# Patient Record
Sex: Female | Born: 1940
Health system: Southern US, Community
[De-identification: ages and names within clinical notes are randomized; demographics above are authoritative.]

## PROBLEM LIST (undated history)

## (undated) DIAGNOSIS — J309 Allergic rhinitis, unspecified: Secondary | ICD-10-CM

## (undated) DIAGNOSIS — I10 Essential (primary) hypertension: Secondary | ICD-10-CM

## (undated) DIAGNOSIS — M109 Gout, unspecified: Secondary | ICD-10-CM

## (undated) DIAGNOSIS — R42 Dizziness and giddiness: Secondary | ICD-10-CM

## (undated) DIAGNOSIS — K279 Peptic ulcer, site unspecified, unspecified as acute or chronic, without hemorrhage or perforation: Secondary | ICD-10-CM

## (undated) DIAGNOSIS — I219 Acute myocardial infarction, unspecified: Secondary | ICD-10-CM

## (undated) DIAGNOSIS — K760 Fatty (change of) liver, not elsewhere classified: Secondary | ICD-10-CM

## (undated) DIAGNOSIS — F329 Major depressive disorder, single episode, unspecified: Secondary | ICD-10-CM

## (undated) DIAGNOSIS — F419 Anxiety disorder, unspecified: Secondary | ICD-10-CM

## (undated) DIAGNOSIS — T7840XA Allergy, unspecified, initial encounter: Secondary | ICD-10-CM

## (undated) DIAGNOSIS — F32A Depression, unspecified: Secondary | ICD-10-CM

## (undated) DIAGNOSIS — E785 Hyperlipidemia, unspecified: Secondary | ICD-10-CM

## (undated) DIAGNOSIS — J449 Chronic obstructive pulmonary disease, unspecified: Secondary | ICD-10-CM

## (undated) DIAGNOSIS — N189 Chronic kidney disease, unspecified: Secondary | ICD-10-CM

## (undated) DIAGNOSIS — K219 Gastro-esophageal reflux disease without esophagitis: Secondary | ICD-10-CM

## (undated) HISTORY — PX: WISDOM TOOTH EXTRACTION: SHX21

## (undated) HISTORY — DX: Anxiety disorder, unspecified: F41.9

## (undated) HISTORY — DX: Hyperlipidemia, unspecified: E78.5

## (undated) HISTORY — DX: Allergic rhinitis, unspecified: J30.9

## (undated) HISTORY — DX: Chronic kidney disease, unspecified: N18.9

## (undated) HISTORY — DX: Allergy, unspecified, initial encounter: T78.40XA

## (undated) HISTORY — DX: Fatty (change of) liver, not elsewhere classified: K76.0

## (undated) HISTORY — DX: Acute myocardial infarction, unspecified: I21.9

## (undated) HISTORY — PX: APPENDECTOMY: SHX54

## (undated) HISTORY — DX: Peptic ulcer, site unspecified, unspecified as acute or chronic, without hemorrhage or perforation: K27.9

## (undated) HISTORY — PX: ABDOMINAL HYSTERECTOMY: SHX81

## (undated) HISTORY — DX: Gout, unspecified: M10.9

---

## 2003-01-04 DIAGNOSIS — K279 Peptic ulcer, site unspecified, unspecified as acute or chronic, without hemorrhage or perforation: Secondary | ICD-10-CM

## 2003-01-04 HISTORY — DX: Peptic ulcer, site unspecified, unspecified as acute or chronic, without hemorrhage or perforation: K27.9

## 2004-04-30 ENCOUNTER — Ambulatory Visit (HOSPITAL_COMMUNITY): Admission: RE | Admit: 2004-04-30 | Discharge: 2004-04-30 | Payer: Self-pay | Admitting: Internal Medicine

## 2007-02-17 ENCOUNTER — Ambulatory Visit (HOSPITAL_COMMUNITY): Admission: RE | Admit: 2007-02-17 | Discharge: 2007-02-17 | Payer: Self-pay | Admitting: Internal Medicine

## 2009-09-29 ENCOUNTER — Emergency Department (HOSPITAL_COMMUNITY): Admission: EM | Admit: 2009-09-29 | Discharge: 2009-09-29 | Payer: Self-pay | Admitting: Emergency Medicine

## 2010-03-03 ENCOUNTER — Encounter: Admission: RE | Admit: 2010-03-03 | Discharge: 2010-03-03 | Payer: Self-pay | Admitting: Internal Medicine

## 2010-04-25 ENCOUNTER — Encounter: Payer: Self-pay | Admitting: Internal Medicine

## 2010-06-21 LAB — DIFFERENTIAL
Basophils Absolute: 0 10*3/uL (ref 0.0–0.1)
Basophils Relative: 0 % (ref 0–1)
Lymphocytes Relative: 21 % (ref 12–46)
Neutro Abs: 8.6 10*3/uL — ABNORMAL HIGH (ref 1.7–7.7)

## 2010-06-21 LAB — CBC
HCT: 41.4 % (ref 36.0–46.0)
Hemoglobin: 14.1 g/dL (ref 12.0–15.0)
MCH: 30 pg (ref 26.0–34.0)
MCV: 87.9 fL (ref 78.0–100.0)
RBC: 4.71 MIL/uL (ref 3.87–5.11)

## 2010-06-21 LAB — COMPREHENSIVE METABOLIC PANEL
BUN: 14 mg/dL (ref 6–23)
CO2: 24 mEq/L (ref 19–32)
Chloride: 98 mEq/L (ref 96–112)
Creatinine, Ser: 1.19 mg/dL (ref 0.4–1.2)
GFR calc non Af Amer: 45 mL/min — ABNORMAL LOW (ref >60)
Glucose, Bld: 96 mg/dL (ref 70–99)
Total Bilirubin: 0.5 mg/dL (ref 0.3–1.2)

## 2010-06-21 LAB — LIPASE, BLOOD: Lipase: 34 U/L (ref 11–59)

## 2010-06-21 LAB — POCT CARDIAC MARKERS
CKMB, poc: 1.6 ng/mL (ref 1.0–8.0)
Myoglobin, poc: 276 ng/mL (ref 12–200)

## 2011-06-17 ENCOUNTER — Encounter (INDEPENDENT_AMBULATORY_CARE_PROVIDER_SITE_OTHER): Payer: No Typology Code available for payment source | Admitting: Obstetrics and Gynecology

## 2011-06-17 DIAGNOSIS — L820 Inflamed seborrheic keratosis: Secondary | ICD-10-CM

## 2012-03-14 ENCOUNTER — Other Ambulatory Visit: Payer: Self-pay | Admitting: Obstetrics and Gynecology

## 2012-03-14 MED ORDER — NYSTATIN-TRIAMCINOLONE 100000-0.1 UNIT/GM-% EX OINT: TOPICAL_OINTMENT | Freq: Two times a day (BID) | CUTANEOUS | Status: DC

## 2013-01-09 ENCOUNTER — Other Ambulatory Visit (HOSPITAL_COMMUNITY): Payer: Self-pay | Admitting: Internal Medicine

## 2013-01-09 DIAGNOSIS — Z1231 Encounter for screening mammogram for malignant neoplasm of breast: Secondary | ICD-10-CM

## 2013-01-18 ENCOUNTER — Ambulatory Visit (HOSPITAL_COMMUNITY)
Admission: RE | Admit: 2013-01-18 | Discharge: 2013-01-18 | Disposition: A | Discharge status: Home / Self Care | Payer: Medicare Other | Source: Ambulatory Visit | Attending: Internal Medicine | Admitting: Internal Medicine

## 2013-01-18 DIAGNOSIS — Z1231 Encounter for screening mammogram for malignant neoplasm of breast: Secondary | ICD-10-CM | POA: Insufficient documentation

## 2014-01-30 ENCOUNTER — Other Ambulatory Visit: Payer: Self-pay | Admitting: Internal Medicine

## 2014-01-30 ENCOUNTER — Other Ambulatory Visit: Payer: Self-pay

## 2014-01-30 DIAGNOSIS — E2839 Other primary ovarian failure: Secondary | ICD-10-CM

## 2014-01-30 DIAGNOSIS — M810 Age-related osteoporosis without current pathological fracture: Secondary | ICD-10-CM

## 2014-01-30 DIAGNOSIS — Z1231 Encounter for screening mammogram for malignant neoplasm of breast: Secondary | ICD-10-CM

## 2014-02-12 ENCOUNTER — Other Ambulatory Visit (HOSPITAL_BASED_OUTPATIENT_CLINIC_OR_DEPARTMENT_OTHER): Payer: Self-pay | Admitting: Internal Medicine

## 2014-02-12 ENCOUNTER — Ambulatory Visit (HOSPITAL_BASED_OUTPATIENT_CLINIC_OR_DEPARTMENT_OTHER)
Admission: RE | Admit: 2014-02-12 | Discharge: 2014-02-12 | Disposition: A | Discharge status: Home / Self Care | Payer: Medicare PPO | Source: Ambulatory Visit | Attending: Internal Medicine | Admitting: Internal Medicine

## 2014-02-12 DIAGNOSIS — M25512 Pain in left shoulder: Secondary | ICD-10-CM | POA: Insufficient documentation

## 2014-02-12 DIAGNOSIS — R52 Pain, unspecified: Secondary | ICD-10-CM

## 2014-03-05 ENCOUNTER — Other Ambulatory Visit: Payer: Self-pay | Admitting: Internal Medicine

## 2014-03-05 ENCOUNTER — Ambulatory Visit
Admission: RE | Admit: 2014-03-05 | Discharge: 2014-03-05 | Disposition: A | Discharge status: Home / Self Care | Payer: Commercial Managed Care - HMO | Source: Ambulatory Visit | Attending: Internal Medicine | Admitting: Internal Medicine

## 2014-03-05 ENCOUNTER — Ambulatory Visit
Admission: RE | Admit: 2014-03-05 | Discharge: 2014-03-05 | Disposition: A | Discharge status: Home / Self Care | Payer: Commercial Managed Care - HMO | Source: Ambulatory Visit

## 2014-03-05 DIAGNOSIS — E2839 Other primary ovarian failure: Secondary | ICD-10-CM

## 2014-03-05 DIAGNOSIS — Z1231 Encounter for screening mammogram for malignant neoplasm of breast: Secondary | ICD-10-CM

## 2014-03-05 DIAGNOSIS — M810 Age-related osteoporosis without current pathological fracture: Secondary | ICD-10-CM

## 2015-07-04 ENCOUNTER — Other Ambulatory Visit (HOSPITAL_BASED_OUTPATIENT_CLINIC_OR_DEPARTMENT_OTHER): Payer: Self-pay | Admitting: Internal Medicine

## 2015-07-04 DIAGNOSIS — Z1231 Encounter for screening mammogram for malignant neoplasm of breast: Secondary | ICD-10-CM

## 2015-07-08 ENCOUNTER — Ambulatory Visit (HOSPITAL_BASED_OUTPATIENT_CLINIC_OR_DEPARTMENT_OTHER)
Admission: RE | Admit: 2015-07-08 | Discharge: 2015-07-08 | Disposition: A | Discharge status: Home / Self Care | Payer: Medicare HMO | Source: Ambulatory Visit | Attending: Internal Medicine | Admitting: Internal Medicine

## 2015-07-08 DIAGNOSIS — Z1231 Encounter for screening mammogram for malignant neoplasm of breast: Secondary | ICD-10-CM | POA: Diagnosis not present

## 2016-04-13 ENCOUNTER — Other Ambulatory Visit: Payer: Self-pay | Admitting: Gastroenterology

## 2016-04-13 DIAGNOSIS — K2100 Gastro-esophageal reflux disease with esophagitis, without bleeding: Secondary | ICD-10-CM

## 2016-04-13 DIAGNOSIS — R1013 Epigastric pain: Secondary | ICD-10-CM

## 2016-04-20 ENCOUNTER — Ambulatory Visit
Admission: RE | Admit: 2016-04-20 | Discharge: 2016-04-20 | Disposition: A | Discharge status: Home / Self Care | Payer: Commercial Managed Care - HMO | Source: Ambulatory Visit | Attending: Gastroenterology | Admitting: Gastroenterology

## 2016-04-20 DIAGNOSIS — K2100 Gastro-esophageal reflux disease with esophagitis, without bleeding: Secondary | ICD-10-CM

## 2016-04-20 DIAGNOSIS — R1013 Epigastric pain: Secondary | ICD-10-CM

## 2016-11-13 ENCOUNTER — Emergency Department (HOSPITAL_BASED_OUTPATIENT_CLINIC_OR_DEPARTMENT_OTHER)
Admission: EM | Admit: 2016-11-13 | Discharge: 2016-11-13 | Disposition: A | Discharge status: Home / Self Care | Payer: Medicare HMO | Attending: Physician Assistant | Admitting: Physician Assistant

## 2016-11-13 ENCOUNTER — Encounter (HOSPITAL_BASED_OUTPATIENT_CLINIC_OR_DEPARTMENT_OTHER): Payer: Self-pay | Admitting: Emergency Medicine

## 2016-11-13 ENCOUNTER — Emergency Department (HOSPITAL_BASED_OUTPATIENT_CLINIC_OR_DEPARTMENT_OTHER): Payer: Medicare HMO

## 2016-11-13 DIAGNOSIS — K5790 Diverticulosis of intestine, part unspecified, without perforation or abscess without bleeding: Secondary | ICD-10-CM | POA: Insufficient documentation

## 2016-11-13 DIAGNOSIS — I10 Essential (primary) hypertension: Secondary | ICD-10-CM | POA: Insufficient documentation

## 2016-11-13 DIAGNOSIS — J449 Chronic obstructive pulmonary disease, unspecified: Secondary | ICD-10-CM | POA: Insufficient documentation

## 2016-11-13 DIAGNOSIS — K5792 Diverticulitis of intestine, part unspecified, without perforation or abscess without bleeding: Secondary | ICD-10-CM

## 2016-11-13 DIAGNOSIS — Z79899 Other long term (current) drug therapy: Secondary | ICD-10-CM | POA: Insufficient documentation

## 2016-11-13 DIAGNOSIS — R1084 Generalized abdominal pain: Secondary | ICD-10-CM | POA: Diagnosis present

## 2016-11-13 HISTORY — DX: Major depressive disorder, single episode, unspecified: F32.9

## 2016-11-13 HISTORY — DX: Gastro-esophageal reflux disease without esophagitis: K21.9

## 2016-11-13 HISTORY — DX: Essential (primary) hypertension: I10

## 2016-11-13 HISTORY — DX: Chronic obstructive pulmonary disease, unspecified: J44.9

## 2016-11-13 HISTORY — DX: Depression, unspecified: F32.A

## 2016-11-13 LAB — CBC WITH DIFFERENTIAL/PLATELET
BASOS ABS: 0 10*3/uL (ref 0.0–0.1)
BASOS PCT: 0 %
EOS ABS: 0.2 10*3/uL (ref 0.0–0.7)
Eosinophils Relative: 2 %
HEMATOCRIT: 38 % (ref 36.0–46.0)
HEMOGLOBIN: 12.1 g/dL (ref 12.0–15.0)
Lymphocytes Relative: 15 %
Lymphs Abs: 1.5 10*3/uL (ref 0.7–4.0)
MCH: 28.2 pg (ref 26.0–34.0)
MCHC: 31.8 g/dL (ref 30.0–36.0)
MCV: 88.6 fL (ref 78.0–100.0)
Monocytes Absolute: 0.6 10*3/uL (ref 0.1–1.0)
Monocytes Relative: 5 %
NEUTROS ABS: 7.8 10*3/uL — AB (ref 1.7–7.7)
Neutrophils Relative %: 78 %
Platelets: 206 10*3/uL (ref 150–400)
RBC: 4.29 MIL/uL (ref 3.87–5.11)
RDW: 16.8 % — ABNORMAL HIGH (ref 11.5–15.5)
WBC: 10.2 10*3/uL (ref 4.0–10.5)

## 2016-11-13 LAB — TROPONIN I

## 2016-11-13 LAB — URINALYSIS, ROUTINE W REFLEX MICROSCOPIC
BILIRUBIN URINE: NEGATIVE
Glucose, UA: NEGATIVE mg/dL
Hgb urine dipstick: NEGATIVE
KETONES UR: NEGATIVE mg/dL
NITRITE: NEGATIVE
PH: 5.5 (ref 5.0–8.0)
Protein, ur: NEGATIVE mg/dL
SPECIFIC GRAVITY, URINE: 1.029 (ref 1.005–1.030)

## 2016-11-13 LAB — COMPREHENSIVE METABOLIC PANEL
ALK PHOS: 76 U/L (ref 38–126)
ALT: 16 U/L (ref 14–54)
ANION GAP: 10 (ref 5–15)
AST: 19 U/L (ref 15–41)
Albumin: 3.8 g/dL (ref 3.5–5.0)
BILIRUBIN TOTAL: 0.5 mg/dL (ref 0.3–1.2)
BUN: 13 mg/dL (ref 6–20)
CALCIUM: 8.9 mg/dL (ref 8.9–10.3)
CO2: 24 mmol/L (ref 22–32)
CREATININE: 1.45 mg/dL — AB (ref 0.44–1.00)
Chloride: 103 mmol/L (ref 101–111)
GFR, EST AFRICAN AMERICAN: 39 mL/min — AB (ref >60)
GFR, EST NON AFRICAN AMERICAN: 34 mL/min — AB (ref >60)
Glucose, Bld: 94 mg/dL (ref 65–99)
Potassium: 4.4 mmol/L (ref 3.5–5.1)
SODIUM: 137 mmol/L (ref 135–145)
TOTAL PROTEIN: 7.8 g/dL (ref 6.5–8.1)

## 2016-11-13 LAB — URINALYSIS, MICROSCOPIC (REFLEX): RBC / HPF: NONE SEEN RBC/hpf (ref 0–5)

## 2016-11-13 MED ORDER — FENTANYL CITRATE (PF) 100 MCG/2ML IJ SOLN: 25.0000 ug | Freq: Once | INTRAMUSCULAR | Status: DC

## 2016-11-13 MED ORDER — CIPROFLOXACIN HCL 500 MG PO TABS
500.0000 mg | ORAL_TABLET | Freq: Once | ORAL | Status: AC
  Administered 2016-11-13: 500 mg via ORAL
  Filled 2016-11-13: qty 1

## 2016-11-13 MED ORDER — IOPAMIDOL (ISOVUE-300) INJECTION 61%
100.0000 mL | Freq: Once | INTRAVENOUS | Status: AC | PRN
  Administered 2016-11-13: 80 mL via INTRAVENOUS

## 2016-11-13 MED ORDER — CIPROFLOXACIN HCL 500 MG PO TABS: 500.0000 mg | ORAL_TABLET | Freq: Two times a day (BID) | ORAL | 0 refills | Status: DC

## 2016-11-13 MED ORDER — SODIUM CHLORIDE 0.9 % IV BOLUS (SEPSIS)
500.0000 mL | Freq: Once | INTRAVENOUS | Status: AC
  Administered 2016-11-13: 500 mL via INTRAVENOUS

## 2016-11-13 MED ORDER — METRONIDAZOLE 500 MG PO TABS
500.0000 mg | ORAL_TABLET | Freq: Once | ORAL | Status: AC
  Administered 2016-11-13: 500 mg via ORAL
  Filled 2016-11-13: qty 1

## 2016-11-13 MED ORDER — METRONIDAZOLE 500 MG PO TABS: 500.0000 mg | ORAL_TABLET | Freq: Two times a day (BID) | ORAL | 0 refills | Status: DC

## 2016-11-13 NOTE — Discharge Instructions (Signed)
Please follow-up with your GI physician as planned. Please return with any high fevers, worsening abdominal pain or other concerns.

## 2016-11-13 NOTE — ED Provider Notes (Addendum)
[facility] Provider Note   CSN: [id number] Arrival date & time: 11/13/16  1348     History   Chief Complaint Chief Complaint  Patient presents with  . Abdominal Pain    HPI Veronica Wolf is a 76 y.o. female.   Patient is a 76 year old female with history of COPD, depression, GERD, hypertension. She is a very inconsistent historian. After discussing her story with her for quite a long time I think that the progression of events was that patient was in [place], had x-ray and was diagnosed with diverticulitis and started her on antibiotics. Then she followed up with Dr. Madilyn Fireman [facility]) in his office. Dr. Madilyn Fireman ordered a CAT scan. She's unclear the results. That was 3 weeks ago. Patient been feeling fine and then acutely started feeling worse today. Patient started having sharp abdominal pain associated with diarrhea and nausea HPI  Past Medical History:  Diagnosis Date  . COPD (chronic obstructive pulmonary disease) (HCC)   . Depression   . GERD (gastroesophageal reflux disease)   . Hypertension     There are no active problems to display for this patient.   Past Surgical History:  Procedure Laterality Date  . ABDOMINAL HYSTERECTOMY      OB History    No data available       Home Medications    Prior to Admission medications   Medication Sig Start Date End Date Taking? Authorizing Provider  allopurinol (ZYLOPRIM) 100 MG tablet Take 100 mg by mouth daily.   Yes [provider]  citalopram (CELEXA) 40 MG tablet Take 40 mg by mouth daily.   Yes [provider]  Colchicine (MITIGARE) 0.6 MG CAPS Take by mouth.   Yes [provider]  cyclobenzaprine (FLEXERIL) 10 MG tablet Take 10 mg by mouth 3 (three) times daily as needed for muscle spasms.   Yes [provider]  diazepam (VALIUM) 10 MG tablet Take 10 mg by mouth every 6 (six) hours as needed for anxiety.   Yes [provider]  dicyclomine (BENTYL) 10 MG  capsule Take 10 mg by mouth 4 (four) times daily -  before meals and at bedtime.   Yes [provider]  Diphenhydramine-APAP 12.5-325 MG/15ML LIQD Take by mouth.   Yes [provider]  lisinopril-hydrochlorothiazide (PRINZIDE,ZESTORETIC) 20-25 MG tablet Take 1 tablet by mouth daily.   Yes [provider]  pantoprazole (PROTONIX) 40 MG tablet Take 40 mg by mouth daily.   Yes [provider]  ciprofloxacin (CIPRO) 500 MG tablet Take 1 tablet (500 mg total) by mouth 2 (two) times daily. 11/13/16   Mackuen, Courteney Lyn, MD  metroNIDAZOLE (FLAGYL) 500 MG tablet Take 1 tablet (500 mg total) by mouth 2 (two) times daily. 11/13/16   Mackuen, Courteney Lyn, MD  nystatin-triamcinolone ointment (MYCOLOG) Apply topically 2 (two) times daily. 03/14/12   Henreitta Leber, PA-C    Family History History reviewed. No pertinent family history.  Social History Social History  Substance Use Topics  . Smoking status: Never Smoker  . Smokeless tobacco: Never Used  . Alcohol use No     Allergies   Patient has no known allergies.   Review of Systems Review of Systems  Constitutional: Negative for activity change.  Respiratory: Negative for shortness of breath.   Cardiovascular: Negative for chest pain.  Gastrointestinal: Positive for abdominal pain, diarrhea and nausea. Negative for vomiting.  Neurological: Negative for dizziness.  All other systems reviewed and are negative.  Physical Exam Updated Vital Signs BP 128/79   Pulse 65   Temp 98.5 F (36.9 C) (Oral)   Resp 18   Ht 5\' 4"  (1.626 m)   Wt 89.8 kg (198 lb)   SpO2 99%   BMI 33.99 kg/m   Physical Exam  Constitutional: She is oriented to person, place, and time. She appears well-developed and well-nourished.  HENT:  Head: Normocephalic and atraumatic.  Eyes: Right eye exhibits no discharge.  Cardiovascular: Normal rate, regular rhythm and normal heart sounds.   No murmur heard. Pulmonary/Chest:  Effort normal and breath sounds normal. She has no wheezes. She has no rales.  Abdominal: Soft. She exhibits distension. There is tenderness.  Neurological: She is oriented to person, place, and time.  Skin: Skin is warm and dry. She is not diaphoretic.  Psychiatric: She has a normal mood and affect.  Nursing note and vitals reviewed.    ED Treatments / Results  Labs (all labs ordered are listed, but only abnormal results are displayed) Labs Reviewed  COMPREHENSIVE METABOLIC PANEL - Abnormal; Notable for the following:       Result Value   Creatinine, Ser 1.45 (*)    GFR calc non Af Amer 34 (*)    GFR calc Af Amer 39 (*)    All other components within normal limits  CBC WITH DIFFERENTIAL/PLATELET - Abnormal; Notable for the following:    RDW 16.8 (*)    Neutro Abs 7.8 (*)    All other components within normal limits  URINALYSIS, ROUTINE W REFLEX MICROSCOPIC - Abnormal; Notable for the following:    Leukocytes, UA TRACE (*)    All other components within normal limits  URINALYSIS, MICROSCOPIC (REFLEX) - Abnormal; Notable for the following:    Bacteria, UA MANY (*)    Squamous Epithelial / LPF 6-30 (*)    All other components within normal limits  TROPONIN I    EKG  EKG Interpretation None       Radiology Ct Abdomen Pelvis W Contrast  Result Date: 11/13/2016 CLINICAL DATA:  Abdominal pain, nausea, diarrhea, and constipation. Symptoms for 1 month. EXAM: CT ABDOMEN AND PELVIS WITH CONTRAST TECHNIQUE: Multidetector CT imaging of the abdomen and pelvis was performed using the standard protocol following bolus administration of intravenous contrast. CONTRAST:  80mL ISOVUE-300 IOPAMIDOL (ISOVUE-300) INJECTION 61% COMPARISON:  [facility] FINDINGS: Lower chest: The lung bases are clear without focal nodule, mass, or airspace disease. The heart size is normal. No significant pleural or pericardial effusion is present. Hepatobiliary: No  focal liver abnormality is seen. No gallstones, gallbladder wall thickening, or biliary dilatation. Pancreas: Unremarkable. No pancreatic ductal dilatation or surrounding inflammatory changes. Spleen: Normal in size without focal abnormality. Adrenals/Urinary Tract: Adrenal glands are normal bilaterally. The kidneys and ureters are unremarkable. No focal mass lesion or stone is present. There is no hydronephrosis. The urinary bladder is within normal limits. Stomach/Bowel: The stomach and duodenum are within normal limits. Small bowel is unremarkable. The appendix is not discretely visualized and may be surgically absent. The ascending and transverse colon are within normal limits. The descending colon is unremarkable. Diverticular changes are present in the sigmoid colon. Subtle inflammatory change suggests early diverticulitis. There is no focal abscess or free air. Vascular/Lymphatic: Atherosclerotic changes are present in the aorta without aneurysm. No significant adenopathy is present. Reproductive: Status post hysterectomy. No adnexal masses. Other: No abdominal wall hernia or abnormality. No abdominopelvic ascites. Musculoskeletal: L1 superior  endplate fracture demonstrates 20% loss of height anteriorly. There is no retropulsion of bone. Fracture may not be completely healed. This was reported previously. Vertebral body heights are otherwise normal. Grade 1 anterolisthesis is associated with uncovering of a broad-based disc protrusion at L3-4. A broad-based disc protrusion and facet hypertrophy is present at L4-5 as well with right greater than left foraminal and subarticular narrowing. The bony pelvis is intact. The hips are unremarkable. IMPRESSION: 1. Sigmoid diverticulitis without evidence for abscess or free air. 2.  Aortic Atherosclerosis (ICD10-I70.0). 3. Age indeterminate superior endplate fracture at L1 with 20% loss of height. 4. Thoracic and lumbar spondylosis as described. 5. Hysterectomy  Electronically Signed   By: Marin Roberts M.D.   On: 11/13/2016 16:24    Procedures Procedures (including critical care time)  Medications Ordered in ED Medications  sodium chloride 0.9 % bolus 500 mL (0 mLs Intravenous Stopped 11/13/16 1649)  iopamidol (ISOVUE-300) 61 % injection 100 mL (80 mLs Intravenous Contrast Given 11/13/16 1557)  ciprofloxacin (CIPRO) tablet 500 mg (500 mg Oral Given 11/13/16 1746)  metroNIDAZOLE (FLAGYL) tablet 500 mg (500 mg Oral Given 11/13/16 1746)     Initial Impression / Assessment and Plan / ED Course  I have reviewed the triage vital signs and the nursing notes.  Pertinent labs & imaging results that were available during my care of the patient were reviewed by me and considered in my medical decision making (see chart for details).    Patient is 76 year old female presenting with unclear course of events. However I know that she has been diagnosed diverticulitis in the past, and has received antibiotics. I knw that she is acutely having abdominal pain today. The pain seems consistent with diverticulitis however given her given her age, an unclear history, will do a repeat CT today.   Patient's daughter arrived. CT showed diverticulitis. We'll treat with antibiotics and have patient follow up with her primary care physician. As well as her gastroenterologist. Daughter in agreement. Patient taking by mouth with normal vital signs at time of discharge.    Final Clinical Impressions(s) / ED Diagnoses   Final diagnoses:  Diverticulitis    New Prescriptions Discharge Medication List as of 11/13/2016  6:15 PM    START taking these medications   Details  ciprofloxacin (CIPRO) 500 MG tablet Take 1 tablet (500 mg total) by mouth 2 (two) times daily., Starting Sat 11/13/2016, Print    metroNIDAZOLE (FLAGYL) 500 MG tablet Take 1 tablet (500 mg total) by mouth 2 (two) times daily., Starting Sat 11/13/2016, Print         Mackuen, Cindee Salt,  MD 11/13/16 2354    Abelino Derrick, MD 11/13/16 1610

## 2016-11-13 NOTE — ED Notes (Signed)
Pt on cardiac monitor and auto VS. Unable to void at this time.

## 2016-11-13 NOTE — ED Triage Notes (Signed)
patient reports that she has had intermittent pain to her abdominal area x 1 month. Patient was [place]. The patient states that the pain was a lot less almost away and she woke up this am and it was back.

## 2016-11-13 NOTE — ED Notes (Signed)
Pt in CT. Aware of need for urine sample.

## 2016-12-22 ENCOUNTER — Inpatient Hospital Stay (HOSPITAL_BASED_OUTPATIENT_CLINIC_OR_DEPARTMENT_OTHER)
Admission: EM | Admit: 2016-12-22 | Discharge: 2016-12-24 | DRG: 313 | Disposition: A | Discharge status: Home / Self Care | Payer: Medicare Other | Attending: Internal Medicine | Admitting: Internal Medicine

## 2016-12-22 ENCOUNTER — Emergency Department (HOSPITAL_BASED_OUTPATIENT_CLINIC_OR_DEPARTMENT_OTHER): Payer: Medicare Other

## 2016-12-22 ENCOUNTER — Observation Stay (HOSPITAL_COMMUNITY): Payer: Medicare Other

## 2016-12-22 ENCOUNTER — Encounter (HOSPITAL_BASED_OUTPATIENT_CLINIC_OR_DEPARTMENT_OTHER): Payer: Self-pay | Admitting: Emergency Medicine

## 2016-12-22 DIAGNOSIS — Z23 Encounter for immunization: Secondary | ICD-10-CM

## 2016-12-22 DIAGNOSIS — N179 Acute kidney failure, unspecified: Secondary | ICD-10-CM | POA: Diagnosis not present

## 2016-12-22 DIAGNOSIS — J449 Chronic obstructive pulmonary disease, unspecified: Secondary | ICD-10-CM | POA: Diagnosis not present

## 2016-12-22 DIAGNOSIS — J4489 Other specified chronic obstructive pulmonary disease: Secondary | ICD-10-CM

## 2016-12-22 DIAGNOSIS — R1013 Epigastric pain: Secondary | ICD-10-CM

## 2016-12-22 DIAGNOSIS — I10 Essential (primary) hypertension: Secondary | ICD-10-CM | POA: Diagnosis not present

## 2016-12-22 DIAGNOSIS — Z79899 Other long term (current) drug therapy: Secondary | ICD-10-CM

## 2016-12-22 DIAGNOSIS — R079 Chest pain, unspecified: Secondary | ICD-10-CM | POA: Diagnosis not present

## 2016-12-22 DIAGNOSIS — R072 Precordial pain: Principal | ICD-10-CM

## 2016-12-22 DIAGNOSIS — K219 Gastro-esophageal reflux disease without esophagitis: Secondary | ICD-10-CM

## 2016-12-22 DIAGNOSIS — F419 Anxiety disorder, unspecified: Secondary | ICD-10-CM | POA: Diagnosis present

## 2016-12-22 DIAGNOSIS — K5792 Diverticulitis of intestine, part unspecified, without perforation or abscess without bleeding: Secondary | ICD-10-CM | POA: Diagnosis present

## 2016-12-22 DIAGNOSIS — F329 Major depressive disorder, single episode, unspecified: Secondary | ICD-10-CM | POA: Diagnosis present

## 2016-12-22 DIAGNOSIS — R0609 Other forms of dyspnea: Secondary | ICD-10-CM

## 2016-12-22 LAB — URINALYSIS, ROUTINE W REFLEX MICROSCOPIC
Bilirubin Urine: NEGATIVE
Glucose, UA: NEGATIVE mg/dL
Hgb urine dipstick: NEGATIVE
KETONES UR: NEGATIVE mg/dL
Nitrite: NEGATIVE
PROTEIN: NEGATIVE mg/dL
Specific Gravity, Urine: 1.005 (ref 1.005–1.030)
pH: 5 (ref 5.0–8.0)

## 2016-12-22 LAB — CBC
HEMATOCRIT: 33.9 % — AB (ref 36.0–46.0)
HEMOGLOBIN: 11.2 g/dL — AB (ref 12.0–15.0)
MCH: 28.9 pg (ref 26.0–34.0)
MCHC: 33 g/dL (ref 30.0–36.0)
MCV: 87.4 fL (ref 78.0–100.0)
Platelets: 216 10*3/uL (ref 150–400)
RBC: 3.88 MIL/uL (ref 3.87–5.11)
RDW: 15.9 % — ABNORMAL HIGH (ref 11.5–15.5)
WBC: 12.6 10*3/uL — ABNORMAL HIGH (ref 4.0–10.5)

## 2016-12-22 LAB — COMPREHENSIVE METABOLIC PANEL
ALK PHOS: 55 U/L (ref 38–126)
ALT: 12 U/L — AB (ref 14–54)
AST: 21 U/L (ref 15–41)
Albumin: 3.6 g/dL (ref 3.5–5.0)
Anion gap: 10 (ref 5–15)
BUN: 23 mg/dL — AB (ref 6–20)
CALCIUM: 9.2 mg/dL (ref 8.9–10.3)
CO2: 16 mmol/L — ABNORMAL LOW (ref 22–32)
CREATININE: 2.79 mg/dL — AB (ref 0.44–1.00)
Chloride: 109 mmol/L (ref 101–111)
GFR, EST AFRICAN AMERICAN: 18 mL/min — AB (ref >60)
GFR, EST NON AFRICAN AMERICAN: 15 mL/min — AB (ref >60)
Glucose, Bld: 116 mg/dL — ABNORMAL HIGH (ref 65–99)
Potassium: 3.9 mmol/L (ref 3.5–5.1)
Sodium: 135 mmol/L (ref 135–145)
Total Bilirubin: 0.4 mg/dL (ref 0.3–1.2)
Total Protein: 7.7 g/dL (ref 6.5–8.1)

## 2016-12-22 LAB — LIPASE, BLOOD: LIPASE: 35 U/L (ref 11–51)

## 2016-12-22 LAB — TROPONIN I: Troponin I: <0.03 ng/mL (ref <0.03)

## 2016-12-22 MED ORDER — HYDRALAZINE HCL 20 MG/ML IJ SOLN: 5.0000 mg | Freq: Four times a day (QID) | INTRAMUSCULAR | Status: DC | PRN

## 2016-12-22 MED ORDER — ONDANSETRON HCL 4 MG/2ML IJ SOLN
4.0000 mg | Freq: Once | INTRAMUSCULAR | Status: AC
  Administered 2016-12-22: 4 mg via INTRAVENOUS
  Filled 2016-12-22: qty 2

## 2016-12-22 MED ORDER — IOPAMIDOL (ISOVUE-300) INJECTION 61%
INTRAVENOUS | Status: AC
  Filled 2016-12-22: qty 30

## 2016-12-22 MED ORDER — ALBUTEROL SULFATE (2.5 MG/3ML) 0.083% IN NEBU: 2.5000 mg | INHALATION_SOLUTION | Freq: Four times a day (QID) | RESPIRATORY_TRACT | Status: DC | PRN

## 2016-12-22 MED ORDER — SODIUM CHLORIDE 0.9 % IV BOLUS (SEPSIS)
500.0000 mL | Freq: Once | INTRAVENOUS | Status: AC
  Administered 2016-12-22: 500 mL via INTRAVENOUS

## 2016-12-22 MED ORDER — HEPARIN SODIUM (PORCINE) 5000 UNIT/ML IJ SOLN
5000.0000 [IU] | Freq: Three times a day (TID) | INTRAMUSCULAR | Status: DC
  Administered 2016-12-22 – 2016-12-24 (×5): 5000 [IU] via SUBCUTANEOUS
  Filled 2016-12-22 (×5): qty 1

## 2016-12-22 MED ORDER — PNEUMOCOCCAL VAC POLYVALENT 25 MCG/0.5ML IJ INJ
0.5000 mL | INJECTION | INTRAMUSCULAR | Status: AC
  Administered 2016-12-23: 0.5 mL via INTRAMUSCULAR
  Filled 2016-12-22: qty 0.5

## 2016-12-22 MED ORDER — GI COCKTAIL ~~LOC~~: 30.0000 mL | Freq: Two times a day (BID) | ORAL | Status: DC | PRN

## 2016-12-22 MED ORDER — SODIUM CHLORIDE 0.9 % IV SOLN
INTRAVENOUS | Status: DC
  Administered 2016-12-22 – 2016-12-24 (×3): via INTRAVENOUS

## 2016-12-22 MED ORDER — METOCLOPRAMIDE HCL 5 MG/ML IJ SOLN
10.0000 mg | Freq: Once | INTRAMUSCULAR | Status: AC
  Administered 2016-12-22: 10 mg via INTRAVENOUS
  Filled 2016-12-22: qty 2

## 2016-12-22 MED ORDER — GI COCKTAIL ~~LOC~~
30.0000 mL | Freq: Once | ORAL | Status: AC
Start: 2016-12-22 — End: 2016-12-22
  Administered 2016-12-22: 30 mL via ORAL
  Filled 2016-12-22: qty 30

## 2016-12-22 MED ORDER — PROMETHAZINE HCL 25 MG/ML IJ SOLN
12.5000 mg | Freq: Once | INTRAMUSCULAR | Status: AC
  Administered 2016-12-22: 12.5 mg via INTRAVENOUS
  Filled 2016-12-22: qty 1

## 2016-12-22 MED ORDER — ALBUTEROL SULFATE HFA 108 (90 BASE) MCG/ACT IN AERS: 1.0000 | INHALATION_SPRAY | Freq: Four times a day (QID) | RESPIRATORY_TRACT | Status: DC | PRN

## 2016-12-22 MED ORDER — IOPAMIDOL (ISOVUE-300) INJECTION 61%
30.0000 mL | Freq: Once | INTRAVENOUS | Status: AC | PRN
  Administered 2016-12-22: 30 mL via ORAL

## 2016-12-22 MED ORDER — DICYCLOMINE HCL 10 MG PO CAPS
10.0000 mg | ORAL_CAPSULE | Freq: Once | ORAL | Status: AC
  Administered 2016-12-22: 10 mg via ORAL
  Filled 2016-12-22: qty 1

## 2016-12-22 NOTE — Plan of Care (Signed)
Problem: Safety: Goal: Ability to remain free from injury will improve Outcome: Completed/Met Date Met: 12/22/16 Bed alarm in place. Call bell within reach. Pt/family oriented to room. Pt is aware to call

## 2016-12-22 NOTE — Progress Notes (Addendum)
76 y.o. female with PMH of COPD, GERD, Depression, and HTN presents to the emergency department for evaluation of 1 week of epigastric abdominal discomfort, poor appetite, chronic diffuse abdominal pain, and new onset chest pressure  , labs show her BUN and creatine are elevated 1.45>2.79 EDP wanted to admit for AKI ,dehydration ,chest pain rule out  Accepted to tele at [facility]

## 2016-12-22 NOTE — ED Triage Notes (Signed)
Patient reports constant chest pain which began "weeks ago" but states this is "getting worse".  Patient also reports generalized abdominal pain x 1 month.  Reports shortness of breath, nausea, vomiting.  Denies diarrhea.

## 2016-12-22 NOTE — ED Provider Notes (Signed)
Emergency Department Provider Note   I have reviewed the triage vital signs and the nursing notes.   HISTORY  Chief Complaint Chest Pain   HPI Veronica Wolf is a 76 y.o. female with PMH of COPD, GERD, Depression, and HTN presents to the emergency department for evaluation of 1 week of epigastric abdominal discomfort, poor appetite, chronic diffuse abdominal pain, and new onset chest pressure starting this morning. Patient states she's had ongoing epigastric discomfort similar to prior episodes. No modifying factors. No radiation of symptoms. She was last seen in the emergency department in August and treated for diverticulitis. She states the symptoms have resolved. She denies any associated fevers or chills. She is followed by gastroenterologist but states her last upper endoscopy was a long time ago. She has been taking her home medications including Protonix with no relief in symptoms.   This morning she developed a central chest pressure with no radiation. She denies any shortness of breath. With this being a new feature of her symptoms she became concerned and presented to the emergency department.   Past Medical History:  Diagnosis Date  . COPD (chronic obstructive pulmonary disease) (HCC)   . Depression   . GERD (gastroesophageal reflux disease)   . Hypertension     Patient Active Problem List   Diagnosis Date Noted  . Chest pain 12/22/2016  . AKI (acute kidney injury) (HCC)   . Precordial chest pain     Past Surgical History:  Procedure Laterality Date  . ABDOMINAL HYSTERECTOMY      Current Outpatient Rx  . Order #: [id number] Class: Historical Med  . Order #: [id number] Class: Print  . Order #: [id number] Class: Historical Med  . Order #: [id number] Class: Historical Med  . Order #: [id number] Class: Historical Med  . Order #: [id number] Class: Historical Med  . Order #: [id number] Class: Historical Med  . Order #: [id number] Class: Historical Med  . Order #: [id number] Class: Historical  Med  . Order #: [id number] Class: Print  . Order #: [id number] Class: Normal  . Order #: [id number] Class: Historical Med    Allergies Patient has no known allergies.  History reviewed. No pertinent family history.  Social History Social History  Substance Use Topics  . Smoking status: Never Smoker  . Smokeless tobacco: Never Used  . Alcohol use No    Review of Systems  Constitutional: No fever/chills Eyes: No visual changes. ENT: No sore throat. Cardiovascular: Positive chest pain. Respiratory: Denies shortness of breath. Gastrointestinal: Positive epigastric abdominal pain.  No nausea, no vomiting.  No diarrhea.  No constipation. Genitourinary: Negative for dysuria. Musculoskeletal: Negative for back pain. Skin: Negative for rash. Neurological: Negative for headaches, focal weakness or numbness.  10-point ROS otherwise negative.  ____________________________________________   PHYSICAL EXAM:  VITAL SIGNS: ED Triage Vitals  Enc Vitals Group     BP 12/22/16 0957 (!) 114/53     Pulse Rate 12/22/16 0957 89     Resp 12/22/16 0957 18     Temp 12/22/16 0957 98.5 F (36.9 C)     Temp Source 12/22/16 0957 Oral     SpO2 12/22/16 0957 96 %     Weight 12/22/16 0959 198 lb (89.8 kg)     Height 12/22/16 0959  (1.6 m)     Pain Score 12/22/16 0958 8   Constitutional: Alert and oriented. Well appearing and in no acute distress. Eyes: Conjunctivae are normal.  Head: Atraumatic. Nose: No congestion/rhinnorhea. Mouth/Throat: Mucous membranes are moist.  Neck: No stridor.  Cardiovascular: Normal  rate, regular rhythm. Good peripheral circulation. Grossly normal heart sounds.   Respiratory: Normal respiratory effort.  No retractions. Lungs CTAB. Gastrointestinal: Soft with mild epigastric abdominal pain. No rebound or guarding. No distention.  Musculoskeletal: No lower extremity tenderness nor edema. No gross deformities of extremities. Neurologic:  Normal speech and language. No  gross focal neurologic deficits are appreciated.  Skin:  Skin is warm, dry and intact. No rash noted.  ____________________________________________   LABS (all labs ordered are listed, but only abnormal results are displayed)  Labs Reviewed  CBC - Abnormal; Notable for the following:       Result Value   WBC 12.6 (*)    Hemoglobin 11.2 (*)    HCT 33.9 (*)    RDW 15.9 (*)    All other components within normal limits  COMPREHENSIVE METABOLIC PANEL - Abnormal; Notable for the following:    CO2 16 (*)    Glucose, Bld 116 (*)    BUN 23 (*)    Creatinine, Ser 2.79 (*)    ALT 12 (*)    GFR calc non Af Amer 15 (*)    GFR calc Af Amer 18 (*)    All other components within normal limits  TROPONIN I  LIPASE, BLOOD  TROPONIN I   ____________________________________________  EKG   EKG Interpretation  Date/Time:  Wednesday December 22 2016 09:59:10 EDT Ventricular Rate:  87 PR Interval:    QRS Duration: 86 QT Interval:  368 QTC Calculation: 443 R Axis:   5 Text Interpretation:  Sinus rhythm Low voltage, precordial leads Borderline T abnormalities, anterior leads Baseline wander in lead(s) II III aVF No STEMI.  Confirmed by Alona Bene [phone]) on 12/22/2016 10:04:39 AM Also confirmed by Alona Bene [phone]), editor Barbette Hair [phone])  on 12/22/2016 10:37:11 AM       ____________________________________________  RADIOLOGY  Dg Chest 2 View  Result Date: 12/22/2016 CLINICAL DATA:  Acute onset left lower chest pain and left upper quadrant abdominal pain. EXAM: CHEST  2 VIEW COMPARISON:  None. FINDINGS: Cardiac silhouette normal in size. Thoracic aorta mildly tortuous and atherosclerotic. Hilar and mediastinal contours otherwise unremarkable. Linear scar or atelectasis in the lingula and right middle lobe. Lungs otherwise clear. Bronchovascular markings normal. Pulmonary vascularity normal. No pleural effusions. Degenerative changes throughout the thoracic and upper lumbar spine.  IMPRESSION: 1. Linear atelectasis or scarring in the lingula and right middle lobe. No acute cardiopulmonary disease otherwise. 2.  Aortic Atherosclerosis (ICD10-170.0) Electronically Signed   By: Hulan Saas M.D.   On: 12/22/2016 10:47    ____________________________________________   PROCEDURES  Procedure(s) performed:   Procedures   ____________________________________________   INITIAL IMPRESSION / ASSESSMENT AND PLAN / ED COURSE  Pertinent labs & imaging results that were available during my care of the patient were reviewed by me and considered in my medical decision making (see chart for details).  Patient resents emergency department for evaluation of epigastric abdominal pain over the last week with new onset chest pressure starting this morning. Abdominal discomfort seems more chronic. She is followed by GI and continues to have pain despite home medications. Plan for GI cocktail. Suspect that CP is related to GI symptoms but new component starting this AM.   Patient no longer having chest pressure. Continues with more chronic epigastric abdominal pain. Initial troponin is negative. Patient with significant worsening kidney function from last ED visit. Not followed by PCP. On further questioning she reports only dribbling, less frequent urination for the last  1-2 weeks. I have some additional concern for acute renal failure. Patient given IVF and cleared for diet. Trending troponin.   Discussed patient's case with Hospitalist, Dr. Susie Cassette to request admission. Patient and family (if present) updated with plan. Care transferred to Hospitalist service.  I reviewed all nursing notes, vitals, pertinent old records, EKGs, labs, imaging (as available).  ____________________________________________  FINAL CLINICAL IMPRESSION(S) / ED DIAGNOSES  Final diagnoses:  Precordial chest pain  AKI (acute kidney injury) (HCC)     MEDICATIONS GIVEN DURING THIS VISIT:  Medications    gi cocktail (Maalox,Lidocaine,Donnatal) (30 mLs Oral Given 12/22/16 1058)  sodium chloride 0.9 % bolus 500 mL (0 mLs Intravenous Stopped 12/22/16 1158)  ondansetron (ZOFRAN) injection 4 mg (4 mg Intravenous Given 12/22/16 1226)  dicyclomine (BENTYL) capsule 10 mg (10 mg Oral Given 12/22/16 1226)  metoCLOPramide (REGLAN) injection 10 mg (10 mg Intravenous Given 12/22/16 1346)  promethazine (PHENERGAN) injection 12.5 mg (12.5 mg Intravenous Given 12/22/16 1447)     NEW OUTPATIENT MEDICATIONS STARTED DURING THIS VISIT:  None   Note:  This document was prepared using Dragon voice recognition software and may include unintentional dictation errors.  Alona Bene, MD Emergency Medicine    Long, Arlyss Repress, MD 12/22/16 1537

## 2016-12-22 NOTE — ED Notes (Signed)
ED Provider at bedside. 

## 2016-12-22 NOTE — H&P (Signed)
History and Physical    Veronica Wolf ZOX:[id number] DOB: [birth year] DOA: 12/22/2016  I have briefly reviewed the patient's prior medical records in [facility]  PCP: Patient, No Pcp Per  Patient coming from: Home  Chief Complaint: Abdominal pain  HPI: Veronica Wolf is a 76 y.o. female with medical history significant of COPD, reflux, hypertension, recent episode of diverticulitis in August 2018, presents to the emergency room with chief complaint of epigastric left upper quadrant abdominal pain for the past several weeks.  Patient is somewhat of a poor historian.  She is also telling me that she has had some chest discomfort associated with her epigastric pain on and off for the same amount of time.  She denies any fever or chills.  She has been complaining of nausea and poor appetite, and has not been able to have much p.o. intake in the last week.  She denies any diarrhea, complaints of being constipated and states that her last "good bowel movement" was several weeks ago.  She complains of intermittent shortness of breath with exertion, and that has been going on for "quite a while".  She is originally from [place] but has been living in [place] for 13 years.  She tells me that she was told at one point while she was in [place] that her kidneys are "slightly off".  ED Course: In the ER she is afebrile, she is normotensive and heart rate is normal.  She is satting well on room air.  Blood work shows slight leukocytosis with a white count of 12, troponin is negative 2.  Her creatinine was found to be elevated at 2.8 from prior values around 1.4 in normal range.  TRH is asked for admission.  Review of Systems: As per HPI otherwise 10 point review of systems negative.   Past Medical History:  Diagnosis Date  . COPD (chronic obstructive pulmonary disease) (HCC)   . Depression   . GERD (gastroesophageal reflux disease)   . Hypertension     Past Surgical History:  Procedure  Laterality Date  . ABDOMINAL HYSTERECTOMY       reports that she has never smoked. She has never used smokeless tobacco. She reports that she does not drink alcohol or use drugs.  No Known Allergies  History reviewed. No pertinent family history.  Prior to Admission medications   Medication Sig Start Date End Date Taking? Authorizing Provider  allopurinol (ZYLOPRIM) 100 MG tablet Take 100 mg by mouth daily.    [provider]  ciprofloxacin (CIPRO) 500 MG tablet Take 1 tablet (500 mg total) by mouth 2 (two) times daily. 11/13/16   Mackuen, Courteney Lyn, MD  citalopram (CELEXA) 40 MG tablet Take 40 mg by mouth daily.    [provider]  Colchicine (MITIGARE) 0.6 MG CAPS Take by mouth.    [provider]  cyclobenzaprine (FLEXERIL) 10 MG tablet Take 10 mg by mouth 3 (three) times daily as needed for muscle spasms.    [provider]  diazepam (VALIUM) 10 MG tablet Take 10 mg by mouth every 6 (six) hours as needed for anxiety.    [provider]  dicyclomine (BENTYL) 10 MG capsule Take 10 mg by mouth 4 (four) times daily -  before meals and at bedtime.    [provider]  Diphenhydramine-APAP 12.5-325 MG/15ML LIQD Take by mouth.    [provider]  lisinopril-hydrochlorothiazide (PRINZIDE,ZESTORETIC) 20-25 MG tablet Take 1 tablet by mouth daily.  [provider]  metroNIDAZOLE (FLAGYL) 500 MG tablet Take 1 tablet (500 mg total) by mouth 2 (two) times daily. 11/13/16   Mackuen, Courteney Lyn, MD  nystatin-triamcinolone ointment (MYCOLOG) Apply topically 2 (two) times daily. 03/14/12   Henreitta Leber, PA-C  pantoprazole (PROTONIX) 40 MG tablet Take 40 mg by mouth daily.    [provider]    Physical Exam: Vitals:   12/22/16 1400 12/22/16 1430 12/22/16 1514 12/22/16 1658  BP: (!) 113/96 (!) 109/49 96/62 (!) 137/57  Pulse: 78 83 73 68  Resp: Temp:    97.9 F (36.6 C)  TempSrc:    Oral  SpO2:  96% 95% 99% 100%  Weight:    82.4 kg (181 lb 10.5 oz)  Height:     (1.6 m)      Constitutional: NAD, calm, comfortable Eyes: PERRL, lids and conjunctivae normal ENMT: Mucous membranes are moist.  Neck: normal, supple Respiratory: clear to auscultation bilaterally, no wheezing, no crackles. Normal respiratory effort. No accessory muscle use.  Cardiovascular: Regular rate and rhythm, no murmurs / rubs / gallops. No extremity edema. 2+ pedal pulses.  Abdomen: Tender to palpation in the epigastric and left upper quadrant area, no guarding, no rebound.  Bowel sounds positive. Musculoskeletal: no clubbing / cyanosis. Normal muscle tone.  Skin: no rashes, lesions, ulcers. No induration Neurologic: nonfocal Psychiatric: Normal judgment and insight. Alert and oriented x 3. Normal mood.   Labs on Admission: I have personally reviewed following labs and imaging studies  CBC:  Recent Labs Lab 12/22/16 1003  WBC 12.6*  HGB 11.2*  HCT 33.9*  MCV 87.4  PLT 216   Basic Metabolic Panel:  Recent Labs Lab 12/22/16 1004  NA 135  K 3.9  CL 109  CO2 16*  GLUCOSE 116*  BUN 23*  CREATININE 2.79*  CALCIUM 9.2   GFR: Estimated Creatinine Clearance: 17.2 mL/min (A) (by C-G formula based on SCr of 2.79 mg/dL (H)). Liver Function Tests:  Recent Labs Lab 12/22/16 1004  AST 21  ALT 12*  ALKPHOS 55  BILITOT 0.4  PROT 7.7  ALBUMIN 3.6    Recent Labs Lab 12/22/16 1004  LIPASE 35   No results for input(s): AMMONIA in the last 168 hours. Coagulation Profile: No results for input(s): INR, PROTIME in the last 168 hours. Cardiac Enzymes:  Recent Labs Lab 12/22/16 1003 12/22/16 1437  TROPONINI <0.03 <0.03   BNP (last 3 results) No results for input(s): PROBNP in the last 8760 hours. HbA1C: No results for input(s): HGBA1C in the last 72 hours. CBG: No results for input(s): GLUCAP in the last 168 hours. Lipid Profile: No results for input(s): CHOL, HDL, LDLCALC, TRIG,  CHOLHDL, LDLDIRECT in the last 72 hours. Thyroid Function Tests: No results for input(s): TSH, T4TOTAL, FREET4, T3FREE, THYROIDAB in the last 72 hours. Anemia Panel: No results for input(s): VITAMINB12, FOLATE, FERRITIN, TIBC, IRON, RETICCTPCT in the last 72 hours. Urine analysis:    Component Value Date/Time   COLORURINE YELLOW 11/13/2016 1659   APPEARANCEUR CLEAR 11/13/2016 1659   LABSPEC 1.029 11/13/2016 1659   PHURINE 5.5 11/13/2016 1659   GLUCOSEU NEGATIVE 11/13/2016 1659   HGBUR NEGATIVE 11/13/2016 1659   BILIRUBINUR NEGATIVE 11/13/2016 1659   KETONESUR NEGATIVE 11/13/2016 1659   PROTEINUR NEGATIVE 11/13/2016 1659   NITRITE NEGATIVE 11/13/2016 1659   LEUKOCYTESUR TRACE (A) 11/13/2016 1659     Radiological Exams on Admission: Dg Chest 2 View  Result Date: 12/22/2016 CLINICAL  DATA:  Acute onset left lower chest pain and left upper quadrant abdominal pain. EXAM: CHEST  2 VIEW COMPARISON:  None. FINDINGS: Cardiac silhouette normal in size. Thoracic aorta mildly tortuous and atherosclerotic. Hilar and mediastinal contours otherwise unremarkable. Linear scar or atelectasis in the lingula and right middle lobe. Lungs otherwise clear. Bronchovascular markings normal. Pulmonary vascularity normal. No pleural effusions. Degenerative changes throughout the thoracic and upper lumbar spine. IMPRESSION: 1. Linear atelectasis or scarring in the lingula and right middle lobe. No acute cardiopulmonary disease otherwise. 2.  Aortic Atherosclerosis (ICD10-170.0) Electronically Signed   By: Hulan Saas M.D.   On: 12/22/2016 10:47    EKG: Independently reviewed. Sinus rhythm  Assessment/Plan Active Problems:   AKI (acute kidney injury) (HCC)   Chest pain   Epigastric pain   COPD with chronic bronchitis (HCC)   GERD (gastroesophageal reflux disease)   DOE (dyspnea on exertion)   Epigastric abdominal pain -Patient reports identical pain when she came into the emergency room 2 months ago  and was diagnosed with diverticulitis.  She is tender to palpation and has slight leukocytosis, however she is afebrile.  Would favor to image her again, will order a CT scan of the abdomen and pelvis with oral contrast only given AK I. -Lipase is normal, LFTs are normal. -Supportive treatment for now  Acute kidney injury -Creatinine significantly elevated 2.8.  She also reports that she has been urinating less in the last few days.  This may be due to poor p.o. intake and dehydration, will give IV fluid challenge and repeat creatinine in the morning.  It appears that she may have a degree of chronic kidney disease based on her reported knowledge more than 13 years ago of her having some kidneys issues -Obtain urinalysis, if CT scan is unrevealing as far as the kidneys are concerned we will obtain ultrasound of the kidneys  Chest pain -Very atypical, will monitor on telemetry and continue cycling cardiac enzymes overnight.  Dyspnea on exertion -Appears chronic, patient has known COPD, however I do not see any 2D echoes in the system.  Will obtain one for completeness  COPD -Currently without wheezing, this appears stable, continue home albuterol inhaler   DVT prophylaxis: Heparin Code Status: Full code Family Communication: No family at bedside Disposition Plan: Admit to telemetry Consults called: None    Admission status: Observation  At the point of initial evaluation, it is my clinical opinion that admission for OBSERVATION is reasonable and necessary because the patient's presenting complaints in the context of their chronic conditions represent sufficient risk of deterioration or significant morbidity to constitute reasonable grounds for close observation in the hospital setting, but that the patient may be medically stable for discharge from the hospital within 24 to 48 hours.    Pamella Pert, MD [facility] Pager [phone]  If 7PM-7AM, please contact  night-coverage [email or web address] Password [facility]  12/22/2016, 5:53 PM

## 2016-12-23 ENCOUNTER — Observation Stay (HOSPITAL_COMMUNITY): Payer: Medicare Other

## 2016-12-23 ENCOUNTER — Encounter (HOSPITAL_COMMUNITY): Payer: Self-pay

## 2016-12-23 DIAGNOSIS — R1013 Epigastric pain: Secondary | ICD-10-CM | POA: Diagnosis not present

## 2016-12-23 DIAGNOSIS — J449 Chronic obstructive pulmonary disease, unspecified: Secondary | ICD-10-CM | POA: Diagnosis not present

## 2016-12-23 DIAGNOSIS — R072 Precordial pain: Secondary | ICD-10-CM | POA: Diagnosis not present

## 2016-12-23 DIAGNOSIS — N179 Acute kidney failure, unspecified: Secondary | ICD-10-CM | POA: Diagnosis not present

## 2016-12-23 LAB — COMPREHENSIVE METABOLIC PANEL
ALBUMIN: 3.6 g/dL (ref 3.5–5.0)
ALT: 11 U/L — ABNORMAL LOW (ref 14–54)
AST: 16 U/L (ref 15–41)
Alkaline Phosphatase: 53 U/L (ref 38–126)
Anion gap: 8 (ref 5–15)
BUN: 19 mg/dL (ref 6–20)
CHLORIDE: 112 mmol/L — AB (ref 101–111)
CO2: 22 mmol/L (ref 22–32)
Calcium: 8.9 mg/dL (ref 8.9–10.3)
Creatinine, Ser: 2.55 mg/dL — ABNORMAL HIGH (ref 0.44–1.00)
GFR calc Af Amer: 20 mL/min — ABNORMAL LOW (ref >60)
GFR calc non Af Amer: 17 mL/min — ABNORMAL LOW (ref >60)
GLUCOSE: 92 mg/dL (ref 65–99)
POTASSIUM: 4.5 mmol/L (ref 3.5–5.1)
Sodium: 142 mmol/L (ref 135–145)
Total Bilirubin: 0.5 mg/dL (ref 0.3–1.2)
Total Protein: 6.8 g/dL (ref 6.5–8.1)

## 2016-12-23 LAB — ECHOCARDIOGRAM COMPLETE
HEIGHTINCHES: 63 in
Weight: 2906.54 oz

## 2016-12-23 LAB — CBC
HEMATOCRIT: 33.5 % — AB (ref 36.0–46.0)
Hemoglobin: 10.8 g/dL — ABNORMAL LOW (ref 12.0–15.0)
MCH: 28.2 pg (ref 26.0–34.0)
MCHC: 32.2 g/dL (ref 30.0–36.0)
MCV: 87.5 fL (ref 78.0–100.0)
Platelets: 207 10*3/uL (ref 150–400)
RBC: 3.83 MIL/uL — ABNORMAL LOW (ref 3.87–5.11)
RDW: 15.9 % — AB (ref 11.5–15.5)
WBC: 10.3 10*3/uL (ref 4.0–10.5)

## 2016-12-23 LAB — TROPONIN I: Troponin I: <0.03 ng/mL (ref <0.03)

## 2016-12-23 MED ORDER — SODIUM CHLORIDE 0.9 % IV BOLUS (SEPSIS)
500.0000 mL | Freq: Once | INTRAVENOUS | Status: AC
  Administered 2016-12-23: 500 mL via INTRAVENOUS

## 2016-12-23 MED ORDER — PANTOPRAZOLE SODIUM 40 MG PO TBEC
40.0000 mg | DELAYED_RELEASE_TABLET | Freq: Every day | ORAL | Status: DC
  Administered 2016-12-23 – 2016-12-24 (×2): 40 mg via ORAL
  Filled 2016-12-23 (×2): qty 1

## 2016-12-23 MED ORDER — DICYCLOMINE HCL 10 MG PO CAPS
10.0000 mg | ORAL_CAPSULE | Freq: Three times a day (TID) | ORAL | Status: DC | PRN
  Administered 2016-12-23: 10 mg via ORAL
  Filled 2016-12-23: qty 1

## 2016-12-23 MED ORDER — FAMOTIDINE 20 MG PO TABS: 20.0000 mg | ORAL_TABLET | Freq: Every day | ORAL | Status: DC

## 2016-12-23 MED ORDER — DIAZEPAM 5 MG PO TABS
5.0000 mg | ORAL_TABLET | Freq: Four times a day (QID) | ORAL | Status: DC | PRN
  Administered 2016-12-23: 5 mg via ORAL
  Filled 2016-12-23: qty 1

## 2016-12-23 NOTE — Progress Notes (Signed)
PROGRESS NOTE    Veronica Wolf  WLS:[id number] DOB: Feb 16, 1941 DOA: 12/22/2016 PCP: Patient, No Pcp Per  Brief Narrative:  Veronica Wolf is a 76 y.o. female with medical history significant of COPD, reflux, hypertension, recent episode of diverticulitis in August 2018, presented to the emergency room with peri-umbilical abdominal pain for the past several weeks. CT benign, labs with AKI  Assessment & Plan:   Active Problems:   AKI (acute kidney injury) ([place]) -admission creatinine elevated at 2.8, baseline is 1.4, suspect this is prerenal secondary to vomiting and recent poor by mouth intake as well as concomitant use of ace/diuretic -Continue IV fluids today, no evidence of hydronephrosis on CT -B met in a.m.  Abdominal pain -Acute and chronic -Patient reports having intermittent abdominal pain for months, last month she had an episode of acute diverticulitis treated with antibiotics, repeat CT this time is unrevealing -History of colonoscopy a few years ago and polypectomy then -Abdominal exam is benign, suspect functional vs IBS versus medication induced -recent antibiotics and colchicine use -Improved, will advance diet, resume Bentyl when necessary and resume her when necessary Valium for anxiety    COPD with chronic bronchitis ([place]) -Stable, nebs when necessary    GERD (gastroesophageal reflux disease) -Continue PPI  DVT prophylaxis:hep SQ Code Status: Full Code Family Communication: none at bedside Disposition Plan: home tomorrow if craet improving    Subjective: abd pain is better, wants to go home, no vomiting  Objective: Vitals:   12/22/16 1514 12/22/16 1658 12/22/16 2030 12/23/16 0502  BP: 96/62 (!) 137/57 (!) 95/55 113/70  Pulse: 73 68 73 80  Resp: '16 18 18 18  '$ Temp:  97.9 F (36.6 C) (!) 97.5 F (36.4 C) (!) 97.4 F (36.3 C)  TempSrc:  Oral Oral Oral  SpO2: 99% 100% 99% 98%  Weight:  82.4 kg (181 lb 10.5 oz)    Height:  '5\' 3"'$  (1.6 m)       Intake/Output Summary (Last 24 hours) at 12/23/16 1222 Last data filed at 12/23/16 0955  Gross per 24 hour  Intake          1616.67 ml  Output             1100 ml  Net           516.67 ml   Filed Weights   12/22/16 0959 12/22/16 1658  Weight: 89.8 kg (198 lb) 82.4 kg (181 lb 10.5 oz)    Examination:  General exam: Appears calm and comfortable  Respiratory system: Clear to auscultation. Respiratory effort normal. Cardiovascular system: S1 & S2 heard, RRR. No JVD, murmurs Gastrointestinal system: Abdomen is nondistended, soft and nontender.  Normal bowel sounds heard. Central nervous system: No focal neurological deficits. Extremities: Symmetric 5 x 5 power. Skin: No rashes, lesions or ulcers Psychiatry: Judgement and insight appear normal. Mood & affect appropriate.     Data Reviewed:   CBC:  Recent Labs Lab 12/22/16 1003 12/23/16 0535  WBC 12.6* 10.3  HGB 11.2* 10.8*  HCT 33.9* 33.5*  MCV 87.4 87.5  PLT 216 811   Basic Metabolic Panel:  Recent Labs Lab 12/22/16 1004 12/23/16 0535  NA 135 142  K 3.9 4.5  CL 109 112*  CO2 16* 22  GLUCOSE 116* 92  BUN 23* 19  CREATININE 2.79* 2.55*  CALCIUM 9.2 8.9   GFR: Estimated Creatinine Clearance: 18.8 mL/min (A) (by C-G formula based on SCr of 2.55 mg/dL (H)). Liver Function Tests:  Recent Labs  Lab 12/22/16 1004 12/23/16 0535  AST 21 16  ALT 12* 11*  ALKPHOS 55 53  BILITOT 0.4 0.5  PROT 7.7 6.8  ALBUMIN 3.6 3.6    Recent Labs Lab 12/22/16 1004  LIPASE 35   No results for input(s): AMMONIA in the last 168 hours. Coagulation Profile: No results for input(s): INR, PROTIME in the last 168 hours. Cardiac Enzymes:  Recent Labs Lab 12/22/16 1003 12/22/16 1437 12/22/16 1817 12/22/16 2349 12/23/16 0535  TROPONINI <0.03 <0.03 <0.03 <0.03 <0.03   BNP (last 3 results) No results for input(s): PROBNP in the last 8760 hours. HbA1C: No results for input(s): HGBA1C in the last 72 hours. CBG: No  results for input(s): GLUCAP in the last 168 hours. Lipid Profile: No results for input(s): CHOL, HDL, LDLCALC, TRIG, CHOLHDL, LDLDIRECT in the last 72 hours. Thyroid Function Tests: No results for input(s): TSH, T4TOTAL, FREET4, T3FREE, THYROIDAB in the last 72 hours. Anemia Panel: No results for input(s): VITAMINB12, FOLATE, FERRITIN, TIBC, IRON, RETICCTPCT in the last 72 hours. Urine analysis:    Component Value Date/Time   COLORURINE YELLOW 12/22/2016 [place] 12/22/2016 1746   LABSPEC 1.005 12/22/2016 1746   PHURINE 5.0 12/22/2016 1746   GLUCOSEU NEGATIVE 12/22/2016 1746   HGBUR NEGATIVE 12/22/2016 1746   BILIRUBINUR NEGATIVE 12/22/2016 1746   KETONESUR NEGATIVE 12/22/2016 1746   PROTEINUR NEGATIVE 12/22/2016 1746   NITRITE NEGATIVE 12/22/2016 1746   LEUKOCYTESUR MODERATE (A) 12/22/2016 1746   Sepsis Labs: '@LABRCNTIP'$ (procalcitonin:4,lacticidven:4)  )No results found for this or any previous visit (from the past 240 hour(s)).       Radiology Studies: Ct Abdomen Pelvis Wo Contrast  Result Date: 12/22/2016 CLINICAL DATA:  Epigastric left upper quadrant abdominal pain for the past several weeks. History of diverticulitis. EXAM: CT ABDOMEN AND PELVIS WITHOUT CONTRAST TECHNIQUE: Multidetector CT imaging of the abdomen and pelvis was performed following the standard protocol without IV contrast. COMPARISON:  CT abdomen dated 11/13/2016. FINDINGS: Lower chest: Emphysematous changes, mild to moderate in degree, at the level of the mid lungs. Scarring/fibrosis at each lung base Hepatobiliary: No focal liver abnormality is seen. No gallstones, gallbladder wall thickening, or biliary dilatation. Pancreas: Unremarkable. No pancreatic ductal dilatation or surrounding inflammatory changes. Spleen: Normal in size without focal abnormality. Adrenals/Urinary Tract: Adrenal glands appear normal. Kidneys are unremarkable without mass, stone or hydronephrosis. No perinephric fluid.  No ureteral or bladder calculi identified. Bladder is decompressed. Stomach/Bowel: Bowel is normal in caliber. Scattered diverticulosis within the descending and sigmoid colon without evidence of acute diverticulitis. Appendix is not convincingly seen but there are no inflammatory changes about the cecum to suggest acute appendicitis. Stomach appears normal, partially decompressed. Vascular/Lymphatic: Aortic atherosclerosis. No enlarged abdominal or pelvic lymph nodes. Reproductive: Status post hysterectomy. No adnexal mass or free fluid. Other: No free fluid or abscess.  No free intraperitoneal air. Musculoskeletal: Degenerative changes throughout the slightly scoliotic thoracolumbar spine, mild to moderate in degree. Chronic mild compression deformity involving the superior endplate of the L1 vertebral body. No acute appearing osseous abnormality. Superficial soft tissues are unremarkable. IMPRESSION: 1. No acute findings within the abdomen or pelvis. No bowel obstruction or evidence of bowel wall inflammation. No free fluid. No renal or ureteral calculi. No evidence of acute solid organ abnormality. 2. Colonic diverticulosis without evidence of acute diverticulitis. 3. Emphysema. 4. Aortic atherosclerosis. Electronically Signed   By: Franki Cabot M.D.   On: 12/22/2016 22:39   Dg Chest 2 View  Result Date: 12/22/2016  CLINICAL DATA:  Acute onset left lower chest pain and left upper quadrant abdominal pain. EXAM: CHEST  2 VIEW COMPARISON:  None. FINDINGS: Cardiac silhouette normal in size. Thoracic aorta mildly tortuous and atherosclerotic. Hilar and mediastinal contours otherwise unremarkable. Linear scar or atelectasis in the lingula and right middle lobe. Lungs otherwise clear. Bronchovascular markings normal. Pulmonary vascularity normal. No pleural effusions. Degenerative changes throughout the thoracic and upper lumbar spine. IMPRESSION: 1. Linear atelectasis or scarring in the lingula and right middle  lobe. No acute cardiopulmonary disease otherwise. 2.  Aortic Atherosclerosis (ICD10-170.0) Electronically Signed   By: Evangeline Dakin M.D.   On: 12/22/2016 10:47        Scheduled Meds: . heparin  5,000 Units Subcutaneous Q8H  . pantoprazole  40 mg Oral Daily   Continuous Infusions: . sodium chloride 75 mL/hr at 12/23/16 0955     LOS: 1 day    Time spent: 6mn    PDomenic Polite MD [facility] Pager [phone] If 7PM-7AM, please contact night-coverage [email or web address] Password [email or web address] 12/23/2016, 12:22 PM

## 2016-12-23 NOTE — Care Management Note (Signed)
Case Management Note  Patient Details  Name: MALISSIE MUSGRAVE MRN: [id number] Date of Birth: 1940-06-22  Subjective/Objective:76 y/o f admitted w/chest pain. From home. No pcp-provided w/pcp listing-encouraged to make appt for pcp-voiced understanding. Has health insurance w/prescription coverage, has an obligated co pay for meds-voiced understanding.                     Action/Plan:d/c plan home.   Expected Discharge Date:                  Expected Discharge Plan:  Home/Self Care  In-House Referral:     Discharge planning Services  CM Consult  Post Acute Care Choice:    Choice offered to:     DME Arranged:    DME Agency:     HH Arranged:    HH Agency:     Status of Service:  In process, will continue to follow  If discussed at Long Length of Stay Meetings, dates discussed:    Additional Comments:  Lanier Clam, RN 12/23/2016, 4:15 PM

## 2016-12-23 NOTE — Progress Notes (Signed)
  Echocardiogram 2D Echocardiogram has been performed.  Veronica Wolf 12/23/2016, 12:28 PM

## 2016-12-23 NOTE — Care Management Obs Status (Signed)
MEDICARE OBSERVATION STATUS NOTIFICATION   Patient Details  Name: Veronica Wolf MRN: [id number] Date of Birth: 10-31-1940   Medicare Observation Status Notification Given:  Yes    MahabirOlegario Messier, RN 12/23/2016, 4:14 PM

## 2016-12-24 ENCOUNTER — Encounter (HOSPITAL_COMMUNITY): Payer: Self-pay

## 2016-12-24 DIAGNOSIS — R1013 Epigastric pain: Secondary | ICD-10-CM | POA: Diagnosis not present

## 2016-12-24 DIAGNOSIS — Z23 Encounter for immunization: Secondary | ICD-10-CM | POA: Diagnosis not present

## 2016-12-24 DIAGNOSIS — N179 Acute kidney failure, unspecified: Secondary | ICD-10-CM | POA: Diagnosis not present

## 2016-12-24 DIAGNOSIS — J449 Chronic obstructive pulmonary disease, unspecified: Secondary | ICD-10-CM | POA: Diagnosis not present

## 2016-12-24 DIAGNOSIS — R072 Precordial pain: Secondary | ICD-10-CM | POA: Diagnosis present

## 2016-12-24 LAB — CBC
HEMATOCRIT: 33.3 % — AB (ref 36.0–46.0)
Hemoglobin: 10.7 g/dL — ABNORMAL LOW (ref 12.0–15.0)
MCH: 27.9 pg (ref 26.0–34.0)
MCHC: 32.1 g/dL (ref 30.0–36.0)
MCV: 86.7 fL (ref 78.0–100.0)
PLATELETS: 186 10*3/uL (ref 150–400)
RBC: 3.84 MIL/uL — ABNORMAL LOW (ref 3.87–5.11)
RDW: 16 % — AB (ref 11.5–15.5)
WBC: 9.8 10*3/uL (ref 4.0–10.5)

## 2016-12-24 LAB — BASIC METABOLIC PANEL
Anion gap: 9 (ref 5–15)
BUN: 19 mg/dL (ref 6–20)
CALCIUM: 8.3 mg/dL — AB (ref 8.9–10.3)
CO2: 17 mmol/L — AB (ref 22–32)
CREATININE: 2.2 mg/dL — AB (ref 0.44–1.00)
Chloride: 115 mmol/L — ABNORMAL HIGH (ref 101–111)
GFR, EST AFRICAN AMERICAN: 24 mL/min — AB (ref >60)
GFR, EST NON AFRICAN AMERICAN: 20 mL/min — AB (ref >60)
Glucose, Bld: 90 mg/dL (ref 65–99)
Potassium: 4.6 mmol/L (ref 3.5–5.1)
SODIUM: 141 mmol/L (ref 135–145)

## 2016-12-24 MED ORDER — DICYCLOMINE HCL 10 MG PO CAPS: 10.0000 mg | ORAL_CAPSULE | Freq: Three times a day (TID) | ORAL | 0 refills | Status: DC | PRN

## 2016-12-24 NOTE — Discharge Instructions (Signed)
Acute Kidney Injury, Adult Acute kidney injury is a sudden worsening of kidney function. The kidneys are organs that have several jobs. They filter the blood to remove waste products and extra fluid. They also maintain a healthy balance of minerals and hormones in the body, which helps control blood pressure and keep bones strong. With this condition, your kidneys do not do their jobs as well as they should. This condition ranges from mild to severe. Over time it may develop into long-lasting (chronic) kidney disease. Early detection and treatment may prevent acute kidney injury from developing into a chronic condition. What are the causes? Common causes of this condition include:  A problem with blood flow to the kidneys. This may be caused by: ? Low blood pressure (hypotension) or shock. ? Blood loss. ? Heart and blood vessel (cardiovascular) disease. ? Severe burns. ? Liver disease.  Direct damage to the kidneys. This may be caused by: ? Certain medicines. ? A kidney infection. ? Poisoning. ? Being around or in contact with toxic substances. ? A surgical wound. ? A hard, direct hit to the kidney area.  A sudden blockage of urine flow. This may be caused by: ? Cancer. ? Kidney stones. ? An enlarged prostate in males.  What are the signs or symptoms? Symptoms of this condition may not be obvious until the condition becomes severe. Symptoms of this condition can include:  Tiredness (lethargy), or difficulty staying awake.  Nausea or vomiting.  Swelling (edema) of the face, legs, ankles, or feet.  Problems with urination, such as: ? Abdominal pain, or pain along the side of your stomach (flank). ? Decreased urine production. ? Decrease in the force of urine flow.  Muscle twitches and cramps, especially in the legs.  Confusion or trouble concentrating.  Loss of appetite.  Fever.  How is this diagnosed? This condition may be diagnosed with tests, including:  Blood  tests.  Urine tests.  Imaging tests.  A test in which a sample of tissue is removed from the kidneys to be examined under a microscope (kidney biopsy).  How is this treated? Treatment for this condition depends on the cause and how severe the condition is. In mild cases, treatment may not be needed. The kidneys may heal on their own. In more severe cases, treatment will involve:  Treating the cause of the kidney injury. This may involve changing any medicines you are taking or adjusting your dosage.  Fluids. You may need specialized IV fluids to balance your body's needs.  Having a catheter placed to drain urine and prevent blockages.  Preventing problems from occurring. This may mean avoiding certain medicines or procedures that can cause further injury to the kidneys.  In some cases treatment may also require:  A procedure to remove toxic wastes from the body (dialysis or continuous renal replacement therapy - CRRT).  Surgery. This may be done to repair a torn kidney, or to remove the blockage from the urinary system.  Follow these instructions at home: Medicines  Take over-the-counter and prescription medicines only as told by your health care provider.  Do not take any new medicines without your health care provider's approval. Many medicines can worsen your kidney damage.  Do not take any vitamin and mineral supplements without your health care provider's approval. Many nutritional supplements can worsen your kidney damage. Lifestyle  If your health care provider prescribed changes to your diet, follow them. You may need to decrease the amount of protein you eat.  Achieve  and maintain a healthy weight. If you need help with this, ask your health care provider.  Start or continue an exercise plan. Try to exercise at least 30 minutes a day, 5 days a week.  Do not use any tobacco products, such as cigarettes, chewing tobacco, and e-cigarettes. If you need help quitting, ask  your health care provider. General instructions  Keep track of your blood pressure. Report changes in your blood pressure as told by your health care provider.  Stay up to date with immunizations. Ask your health care provider which immunizations you need.  Keep all follow-up visits as told by your health care provider. This is important. Where to find more information:  American Association of Kidney Patients: ResidentialShow.is  [employer]: www.kidney.org  [employer]: FightingMatch.com.ee  [facility]: ? www.lifeoptions.org ? www.kidneyschool.org Contact a health care provider if:  Your symptoms get worse.  You develop new symptoms. Get help right away if:  You develop symptoms of worsening kidney disease, which include: ? Headaches. ? Abnormally dark or light skin. ? Easy bruising. ? Frequent hiccups. ? Chest pain. ? Shortness of breath. ? End of menstruation in women. ? Seizures. ? Confusion or altered mental status. ? Abdominal or back pain. ? Itchiness.  You have a fever.  Your body is producing less urine.  You have pain or bleeding when you urinate. Summary  Acute kidney injury is a sudden worsening of kidney function.  Acute kidney injury can be caused by problems with blood flow to the kidneys, direct damage to the kidneys, and sudden blockage of urine flow.  Symptoms of this condition may not be obvious until it becomes severe. Symptoms may include edema, lethargy, confusion, nausea or vomiting, and problems passing urine.  This condition can usually be diagnosed with blood tests, urine tests, and imaging tests. Sometimes a kidney biopsy is done to diagnose this condition.  Treatment for this condition often involves treating the underlying cause. It is treated with fluids, medicines, dialysis, diet changes, or surgery. This information is not intended to replace advice given to you by your health care provider.  Make sure you discuss any questions you have with your health care provider. Document Released: 10/05/2010 Document Revised: 03/12/2016 Document Reviewed: 03/12/2016 Elsevier Interactive Patient Education  2017 Elsevier Inc.   Acute Pain, Adult Acute pain is a type of pain that may last for just a few days or as long as six months. It is often related to an illness, injury, or medical procedure. Acute pain may be mild, moderate, or severe. It usually goes away once your injury has healed or you are no longer ill. Pain can make it hard for you to do daily activities. It can cause anxiety and lead to other problems if left untreated. Treatment depends on the cause and severity of your acute pain. Follow these instructions at home:  Check your pain level as told by your health care provider.  Take over-the-counter and prescription medicines only as told by your health care provider.  If you are taking prescription pain medicine: ? Ask your health care provider about taking a stool softener or laxative to prevent constipation. ? Do not stop taking the medicine suddenly. Talk to your health care provider about how and when to discontinue prescription pain medicine. ? If your pain is severe, do not take more pills than instructed by your health care provider. ? Do not take other over-the-counter pain medicines in addition to this medicine  unless told by your health care provider. ? Do not drive or operate heavy machinery while taking prescription pain medicine.  Apply ice or heat as told by your health care provider. These may reduce swelling and pain.  Ask your health care provider if other strategies such as distraction, relaxation, or physical therapies can help your pain.  Keep all follow-up visits as told by your health care provider. This is important. Contact a health care provider if:  You have pain that is not controlled by medicine.  Your pain does not improve or gets worse.  You  have side effects from pain medicines, such as vomitingor confusion. Get help right away if:  You have severe pain.  You have trouble breathing.  You lose consciousness.  You have chest pain or pressure that lasts for more than a few minutes. Along with the chest pain you may: ? Have pain or discomfort in one or both arms, your back, neck, jaw, or stomach. ? Have shortness of breath. ? Break out in a cold sweat. ? Feel nauseous. ? Become light-headed. These symptoms may represent a serious problem that is an emergency. Do not wait to see if the symptoms will go away. Get medical help right away. Call your local emergency services (911 in the [place] Do not drive yourself to the hospital. This information is not intended to replace advice given to you by your health care provider. Make sure you discuss any questions you have with your health care provider. Document Released: 04/06/2015 Document Revised: 08/29/2015 Document Reviewed: 04/06/2015 Elsevier Interactive Patient Education  [employer].

## 2016-12-24 NOTE — Care Management Note (Signed)
Case Management Note  Patient Details  Name: Veronica Wolf MRN: [id number] Date of Birth: [birth year]  Subjective/Objective:                    Action/Plan:d/c home no needs.   Expected Discharge Date:  12/24/16               Expected Discharge Plan:  Home/Self Care  In-House Referral:     Discharge planning Services  CM Consult  Post Acute Care Choice:    Choice offered to:     DME Arranged:    DME Agency:     HH Arranged:    HH Agency:     Status of Service:  Completed, signed off  If discussed at [employer] of Stay Meetings, dates discussed:    Additional Comments:  Lanier Clam, RN 12/24/2016, 11:39 AM

## 2017-01-07 NOTE — Discharge Summary (Signed)
Physician Discharge Summary  Veronica Wolf VWU:[id number] DOB: March 19, 1941 DOA: 12/22/2016  PCP: Patient, No Pcp Per  Admit date: 12/22/2016 Discharge date: 12/24/2016  Time spent: 35 minutes  Recommendations for Outpatient Follow-up:  1. PCP in 1 week, please check Bmet at follow up   Discharge Diagnoses:  Active Problems:   AKI (acute kidney injury) (HCC)   CKD3   Chest pain   Epigastric pain   COPD with chronic bronchitis (HCC)   GERD (gastroesophageal reflux disease)   DOE (dyspnea on exertion)   Discharge Condition:stable  Diet recommendation: regular  Filed Weights   12/22/16 0959 12/22/16 1658  Weight: 89.8 kg (198 lb) 82.4 kg (181 lb 10.5 oz)    History of present illness:  Veronica Wolf a 76 y.o.femalewith medical history significant of COPD, reflux, hypertension, recent episode of diverticulitis in August 2018, presented to the emergency room with peri-umbilical abdominal pain for the past several weeks. CT benign, labs with AKI  Hospital Course:   AKI (acute kidney injury) (HCC) -admission creatinine elevated at 2.8, baseline is 1.4, suspect this is prerenal secondary to vomiting and recent poor by mouth intake as well as concomitant use of ace/diuretic -hydrated with IVF, ACE stopped, no evidence of hydronephrosis on CT -creatinine improved to 2.2 from 2.8 on admission, check Bmet in 1 week, pt is anxious to go home today and overall stable medically  Abdominal pain -Acute and chronic -Patient reports having intermittent abdominal pain for months, last month she had an episode of acute diverticulitis treated with antibiotics, repeat CT this time is unrevealing -History of colonoscopy a few years ago and polypectomy then -Abdominal exam is benign, suspect functional vs IBS versus medication induced -recent antibiotics and colchicine use -Improved, tolerating diet resumed Bentyl when necessary and resumed her when necessary Valium for anxiety -advised FU  with GI if this keeps recurring    COPD with chronic bronchitis (HCC) -Stable, nebs PRN    GERD (gastroesophageal reflux disease) -Continue PPI   Discharge Exam: Vitals:   12/24/16 0835 12/24/16 0857  BP: (!) 83/45 108/60  Pulse:    Resp:    Temp:    SpO2:      General: AAOx3 Cardiovascular: S1S2/RRR Respiratory: CTAB  Discharge Instructions   Discharge Instructions    Diet - low sodium heart healthy    Complete by:  As directed    Increase activity slowly    Complete by:  As directed      Discharge Medication List as of 12/24/2016 12:54 PM    CONTINUE these medications which have CHANGED   Details  dicyclomine (BENTYL) 10 MG capsule Take 1 capsule (10 mg total) by mouth 3 (three) times daily as needed for spasms., Starting Fri 12/24/2016, Normal      CONTINUE these medications which have NOT CHANGED   Details  acetaminophen (TYLENOL) 325 MG tablet Take 650 mg by mouth every 6 (six) hours as needed for mild pain or moderate pain., Historical Med    allopurinol (ZYLOPRIM) 100 MG tablet Take 100 mg by mouth daily., Historical Med    citalopram (CELEXA) 40 MG tablet Take 40 mg by mouth daily., Historical Med    diazepam (VALIUM) 10 MG tablet Take 10 mg by mouth every 6 (six) hours as needed for anxiety., Historical Med    diphenoxylate-atropine (LOMOTIL) 2.5-0.025 MG tablet Take 1 tablet by mouth daily as needed., Starting Tue 11/16/2016, Historical Med    ondansetron (ZOFRAN) 4 MG tablet Take 4 mg by  mouth every 4 (four) hours as needed., Starting Mon 12/20/2016, Historical Med    pantoprazole (PROTONIX) 40 MG tablet Take 40 mg by mouth 2 (two) times daily. , Historical Med    ranitidine (ZANTAC) 150 MG tablet Take 150 mg by mouth daily., Historical Med      STOP taking these medications     ciprofloxacin (CIPRO) 500 MG tablet      lisinopril (PRINIVIL,ZESTRIL) 10 MG tablet      metroNIDAZOLE (FLAGYL) 500 MG tablet      nystatin-triamcinolone ointment  (MYCOLOG)        No Known Allergies Follow-up Information    PCP. Schedule an appointment as soon as possible for a visit in 1 week(s).   Why:  Also monitor BP and have PCP check Kidney function at follow up           The results of significant diagnostics from this hospitalization (including imaging, microbiology, ancillary and laboratory) are listed below for reference.    Significant Diagnostic Studies: Ct Abdomen Pelvis Wo Contrast  Result Date: 12/22/2016 CLINICAL DATA:  Epigastric left upper quadrant abdominal pain for the past several weeks. History of diverticulitis. EXAM: CT ABDOMEN AND PELVIS WITHOUT CONTRAST TECHNIQUE: Multidetector CT imaging of the abdomen and pelvis was performed following the standard protocol without IV contrast. COMPARISON:  CT abdomen dated 11/13/2016. FINDINGS: Lower chest: Emphysematous changes, mild to moderate in degree, at the level of the mid lungs. Scarring/fibrosis at each lung base Hepatobiliary: No focal liver abnormality is seen. No gallstones, gallbladder wall thickening, or biliary dilatation. Pancreas: Unremarkable. No pancreatic ductal dilatation or surrounding inflammatory changes. Spleen: Normal in size without focal abnormality. Adrenals/Urinary Tract: Adrenal glands appear normal. Kidneys are unremarkable without mass, stone or hydronephrosis. No perinephric fluid. No ureteral or bladder calculi identified. Bladder is decompressed. Stomach/Bowel: Bowel is normal in caliber. Scattered diverticulosis within the descending and sigmoid colon without evidence of acute diverticulitis. Appendix is not convincingly seen but there are no inflammatory changes about the cecum to suggest acute appendicitis. Stomach appears normal, partially decompressed. Vascular/Lymphatic: Aortic atherosclerosis. No enlarged abdominal or pelvic lymph nodes. Reproductive: Status post hysterectomy. No adnexal mass or free fluid. Other: No free fluid or abscess.  No free  intraperitoneal air. Musculoskeletal: Degenerative changes throughout the slightly scoliotic thoracolumbar spine, mild to moderate in degree. Chronic mild compression deformity involving the superior endplate of the L1 vertebral body. No acute appearing osseous abnormality. Superficial soft tissues are unremarkable. IMPRESSION: 1. No acute findings within the abdomen or pelvis. No bowel obstruction or evidence of bowel wall inflammation. No free fluid. No renal or ureteral calculi. No evidence of acute solid organ abnormality. 2. Colonic diverticulosis without evidence of acute diverticulitis. 3. Emphysema. 4. Aortic atherosclerosis. Electronically Signed   By: Bary Richard M.D.   On: 12/22/2016 22:39   Dg Chest 2 View  Result Date: 12/22/2016 CLINICAL DATA:  Acute onset left lower chest pain and left upper quadrant abdominal pain. EXAM: CHEST  2 VIEW COMPARISON:  None. FINDINGS: Cardiac silhouette normal in size. Thoracic aorta mildly tortuous and atherosclerotic. Hilar and mediastinal contours otherwise unremarkable. Linear scar or atelectasis in the lingula and right middle lobe. Lungs otherwise clear. Bronchovascular markings normal. Pulmonary vascularity normal. No pleural effusions. Degenerative changes throughout the thoracic and upper lumbar spine. IMPRESSION: 1. Linear atelectasis or scarring in the lingula and right middle lobe. No acute cardiopulmonary disease otherwise. 2.  Aortic Atherosclerosis (ICD10-170.0) Electronically Signed   By: Kayren Eaves.D.  On: 12/22/2016 10:47    Microbiology: No results found for this or any previous visit (from the past 240 hour(s)).   Labs: Basic Metabolic Panel: No results for input(s): NA, K, CL, CO2, GLUCOSE, BUN, CREATININE, CALCIUM, MG, PHOS in the last 168 hours. Liver Function Tests: No results for input(s): AST, ALT, ALKPHOS, BILITOT, PROT, ALBUMIN in the last 168 hours. No results for input(s): LIPASE, AMYLASE in the last 168 hours. No  results for input(s): AMMONIA in the last 168 hours. CBC: No results for input(s): WBC, NEUTROABS, HGB, HCT, MCV, PLT in the last 168 hours. Cardiac Enzymes: No results for input(s): CKTOTAL, CKMB, CKMBINDEX, TROPONINI in the last 168 hours. BNP: BNP (last 3 results) No results for input(s): BNP in the last 8760 hours.  ProBNP (last 3 results) No results for input(s): PROBNP in the last 8760 hours.  CBG: No results for input(s): GLUCAP in the last 168 hours.     SignedZannie Cove MD.  [facility] 01/07/2017, 5:32 PM

## 2017-02-23 ENCOUNTER — Telehealth: Payer: Self-pay

## 2017-02-27 NOTE — Progress Notes (Signed)
Subjective:    Patient ID: Veronica Wolf, female    DOB: 1940/11/17, 76 y.o.   MRN: [id number]  HPI  Veronica Wolf is a 76 yr old female who presents today to establish care. She has followed in the past by Dr. Concepcion ElkAvbuere.    Pmhx is significant for the following:  HTN-not on antihypertensive.  BP Readings from Last 3 Encounters:  02/28/17 127/82  12/24/16 108/60  11/13/16 128/79   GERD-not currently taking PPI.  She does see Dr. Madilyn FiremanHayes, GI.  Reports that she wakes up with daily nausea.  Notes that when she took protonix nausea was better. Reports that her PO intake has been poor.  Has chronic gerd symptoms.  Wt Readings from Last 3 Encounters:  02/28/17 185 lb 3.2 oz (84 kg)  12/22/16 181 lb 10.5 oz (82.4 kg)  11/13/16 198 lb (89.8 kg)  Notes that she has been very sleepy.  Had CT scan of the abdomen back in September when she had the same pain. Results were as as follows:  1. No acute findings within the abdomen or pelvis. No bowel obstruction or evidence of bowel wall inflammation. No free fluid. No renal or ureteral calculi. No evidence of acute solid organ abnormality. 2. Colonic diverticulosis without evidence of acute diverticulitis. 3. Emphysema. 4. Aortic atherosclerosis.  Depression- maintained on citalopram and prn valium.  Reports that she uses the valium about 3-4 days a week.  Reports that her mood is good overall but has occasional anxiety.   COPD-not on any inhalers. Quit smoking 16 years ago.  Reports that she uses albuterol prn.   Gout- maintained on allopurinol.  Denies recent gout flares. But does have some pain in the left hand. Reports some left hand pain/wrist pain.   Review of Systems Past Medical History:  Diagnosis Date  . COPD (chronic obstructive pulmonary disease) (HCC)   . Depression   . GERD (gastroesophageal reflux disease)   . Hypertension      Social History   Socioeconomic History  . Marital status: Widowed    Spouse name: Not on file  .  Number of children: Not on file  . Years of education: Not on file  . Highest education level: Not on file  Social Needs  . Financial resource strain: Not on file  . Food insecurity - worry: Not on file  . Food insecurity - inability: Not on file  . Transportation needs - medical: Not on file  . Transportation needs - non-medical: Not on file  Occupational History  . Not on file  Tobacco Use  . Smoking status: Never Smoker  . Smokeless tobacco: Never Used  Substance and Sexual Activity  . Alcohol use: No  . Drug use: No  . Sexual activity: Not on file  Other Topics Concern  . Not on file  Social History Narrative  . Not on file    Past Surgical History:  Procedure Laterality Date  . ABDOMINAL HYSTERECTOMY      No family history on file.  No Known Allergies  Current Outpatient Medications on File Prior to Visit  Medication Sig Dispense Refill  . acetaminophen (TYLENOL) 325 MG tablet Take 650 mg by mouth every 6 (six) hours as needed for mild pain or moderate pain.    Marland Kitchen. allopurinol (ZYLOPRIM) 100 MG tablet Take 100 mg by mouth daily.    . citalopram (CELEXA) 40 MG tablet Take 40 mg by mouth daily.    . diazepam (VALIUM) 10 MG  tablet Take 10 mg by mouth every 6 (six) hours as needed for anxiety.    . dicyclomine (BENTYL) 10 MG capsule Take 1 capsule (10 mg total) by mouth 3 (three) times daily as needed for spasms. 15 capsule 0  . diphenoxylate-atropine (LOMOTIL) 2.5-0.025 MG tablet Take 1 tablet by mouth daily as needed.    . ondansetron (ZOFRAN) 4 MG tablet Take 4 mg by mouth every 4 (four) hours as needed.    . pantoprazole (PROTONIX) 40 MG tablet Take 40 mg by mouth 2 (two) times daily.     . ranitidine (ZANTAC) 150 MG tablet Take 150 mg by mouth daily.     No current facility-administered medications on file prior to visit.     BP 127/82   Pulse 82   Temp 97.9 F (36.6 C) (Oral) Comment (Src): Patient states she drank cold water  Ht 5\' 3"  (1.6 m)   Wt 185 lb 3.2  oz (84 kg)   SpO2 97%   BMI 32.81 kg/m       Objective:   Physical Exam  Constitutional: She appears well-developed and well-nourished.  Cardiovascular: Normal rate, regular rhythm and normal heart sounds.  No murmur heard. Pulmonary/Chest: Effort normal and breath sounds normal. No respiratory distress. She has no wheezes.  Abdominal: Soft. Bowel sounds are normal. She exhibits no distension and no mass. There is no rebound and no guarding.  Mild epigastric tenderness without guarding.   Psychiatric: She has a normal mood and affect. Her behavior is normal. Judgment and thought content normal.          Assessment & Plan:  30 minutes spent with pt today. >50% of this time was spent counseling patient on Depression, gout, copd and GERD treatment/symptoms.

## 2017-02-28 ENCOUNTER — Ambulatory Visit: Payer: Medicare HMO | Admitting: Family

## 2017-02-28 ENCOUNTER — Telehealth: Payer: Self-pay

## 2017-02-28 ENCOUNTER — Encounter: Payer: Self-pay | Admitting: Family

## 2017-02-28 VITALS — BP 127/82 | HR 82 | Temp 97.9°F | Ht 63.0 in | Wt 185.2 lb

## 2017-02-28 DIAGNOSIS — M109 Gout, unspecified: Secondary | ICD-10-CM | POA: Diagnosis not present

## 2017-02-28 DIAGNOSIS — J449 Chronic obstructive pulmonary disease, unspecified: Secondary | ICD-10-CM | POA: Diagnosis not present

## 2017-02-28 DIAGNOSIS — M79643 Pain in unspecified hand: Secondary | ICD-10-CM

## 2017-02-28 DIAGNOSIS — R1013 Epigastric pain: Secondary | ICD-10-CM

## 2017-02-28 DIAGNOSIS — F418 Other specified anxiety disorders: Secondary | ICD-10-CM | POA: Insufficient documentation

## 2017-02-28 DIAGNOSIS — K219 Gastro-esophageal reflux disease without esophagitis: Secondary | ICD-10-CM

## 2017-02-28 MED ORDER — ALBUTEROL SULFATE HFA 108 (90 BASE) MCG/ACT IN AERS: 2.0000 | INHALATION_SPRAY | Freq: Four times a day (QID) | RESPIRATORY_TRACT | 0 refills | Status: DC | PRN

## 2017-02-28 MED ORDER — PANTOPRAZOLE SODIUM 40 MG PO TBEC: 40.0000 mg | DELAYED_RELEASE_TABLET | Freq: Every day | ORAL | 2 refills | Status: DC

## 2017-02-28 NOTE — Assessment & Plan Note (Signed)
Uncontrolled, add PPI.   

## 2017-02-28 NOTE — Telephone Encounter (Signed)
Patient was in the office today to see Melissa as a new patient. Records request was faxed to Dr. Albertina ParrAvbuere's office at fax # [phone]

## 2017-02-28 NOTE — Assessment & Plan Note (Signed)
Stable overall. Continue citalopram and prn valium.

## 2017-02-28 NOTE — Assessment & Plan Note (Signed)
Check H pylori breath test, restart PPI, advised follow up with GI- will arrange consult.

## 2017-02-28 NOTE — Assessment & Plan Note (Signed)
Could be cause for her left hand pain, check uric acid level, continue allopurinol.

## 2017-02-28 NOTE — Telephone Encounter (Signed)
Completed.

## 2017-02-28 NOTE — Assessment & Plan Note (Signed)
Clinically stable with prn albuterol.

## 2017-02-28 NOTE — Patient Instructions (Signed)
Please complete lab work prior to leaving. Restart protonix for your stomach. You may add tylenol as needed for left hand pain. You will be contacted about your referral to Dr. Madilyn FiremanHayes.  Welcome to [facility]!

## 2017-03-01 ENCOUNTER — Encounter: Payer: Self-pay | Admitting: Family

## 2017-03-01 ENCOUNTER — Telehealth: Payer: Self-pay | Admitting: Family

## 2017-03-01 MED ORDER — LANSOPRAZOLE 30 MG PO CPDR: 30.0000 mg | DELAYED_RELEASE_CAPSULE | Freq: Every day | ORAL | 2 refills | Status: DC

## 2017-03-01 NOTE — Addendum Note (Signed)
Addended by: [name]'SULLIVAN, Chaley Castellanos on: 03/01/2017 07:41 PM   Modules accepted: Orders

## 2017-03-01 NOTE — Telephone Encounter (Signed)
Copied from CRM [phone]#[id number]. Topic: Quick Communication - See Telephone Encounter >> Mar 01, 2017  1:53 PM Oneal GroutSebastian, Jennifer S wrote: CRM for notification. See Telephone encounter for: requesting generic of PROTONIX. Pharmacy states that it will cost $54 per month, pt is unable to afford that. [facility].

## 2017-03-01 NOTE — Telephone Encounter (Signed)
Rx sent for prevacid in place of protonix.

## 2017-03-02 ENCOUNTER — Other Ambulatory Visit: Payer: Medicare HMO

## 2017-03-02 ENCOUNTER — Telehealth: Payer: Self-pay | Admitting: *Deleted

## 2017-03-02 MED ORDER — PANTOPRAZOLE SODIUM 40 MG PO TBEC: 40.0000 mg | DELAYED_RELEASE_TABLET | Freq: Every day | ORAL | 2 refills | Status: DC

## 2017-03-02 NOTE — Telephone Encounter (Signed)
[facility] stating pt got confused about medication that was too expensive. Protonix was only $2. Rx has been re-sent for protonix and prevacid has been cancelled. Spoke with pharmacist. She reports that pt's insurance is covering Ventolin but copay is $54. She doesn't think proair or proventil will be any cheaper. She states pt has told her that she has a nebulizer at home but they have never filled medicine for that. Please advise any other options?   Pharmacist also notes increasing confusion for pt over the last year. Doesn't take daily meds consistently. They have given her a pill box to try to help her remember the medications but is unsure if she is using it. She got confused about which medication was too expensive. Also called 1 week ago for refill of diazepam. Pharmacist states pt was given a 90 day supply of this on 12/31/16 and she should not be out at this point. Pt reported that she sometimes forgets if she has taken medication so she will take it again.   Please advise re: confusion.

## 2017-03-02 NOTE — Telephone Encounter (Signed)
Notified pt and she voices understanding. 

## 2017-03-02 NOTE — Addendum Note (Signed)
Addended by: Mervin KungFERGERSON, Yanett Conkright A on: 03/02/2017 03:37 PM   Modules accepted: Orders

## 2017-03-02 NOTE — Telephone Encounter (Addendum)
Rx re-sent for protonix. See 03/02/17 phone note.

## 2017-03-02 NOTE — Telephone Encounter (Signed)
Pharmacy is calling and wants to let you know that her protonix was only $2.00, can this please be switched back. Pharmacy has noticed that patient has gotten more confused in the last month. Please advise.

## 2017-03-03 MED ORDER — ALBUTEROL SULFATE (2.5 MG/3ML) 0.083% IN NEBU: 2.5000 mg | INHALATION_SOLUTION | Freq: Four times a day (QID) | RESPIRATORY_TRACT | 1 refills | Status: DC | PRN

## 2017-03-03 NOTE — Telephone Encounter (Signed)
rx sent for albuterol nebs which should be cheaper. Lets bring her back in for memory testing.  I would like her to bring a family member with her as well please.

## 2017-03-04 NOTE — Telephone Encounter (Signed)
Patient has been advised rx was sent to her pharmacy, she was scheduled to come in for memory test on 03-14-17. She was advised to come in with a family member and she said she will come in with her daughter Noreene LarssonJill. I left Noreene LarssonJill a message at phone number [phone] for her to be aware of appointment and to come in with patient.

## 2017-03-11 ENCOUNTER — Telehealth: Payer: Self-pay | Admitting: *Deleted

## 2017-03-11 NOTE — Telephone Encounter (Signed)
Spoke with pt. She states she does not remember 03/02/17 phone conversation. Advised her that she is due for 1 month follow up and we need to do some memory testing. A family member should come with pt to her appt. Confirmed daughter's name (listed as emergency contact) but pt is unable to confirm daughter's contact number. Attempted to reach daughter and received no answer, no voicemail. Will try again later. Scheduled pt f/u with PCP on 03/18/17 at 3pm.  I am concerned that pt will not remember appointment. Also mailed letter to pt.  Copied from CRM [id number]. Topic: Appointment Scheduling - Scheduling Inquiry for Clinic >> Mar 10, 2017  2:20 PM Waymon AmatoBurton, Donna F wrote: Reason for CRM: pt is wanting to know why she needing to be scheduled again next when was just seen last week best number [phone]

## 2017-03-11 NOTE — Telephone Encounter (Signed)
Attempted to reach pt's daughter again but receive no answer and no voicemail.

## 2017-03-11 NOTE — Telephone Encounter (Signed)
Noted  

## 2017-03-14 ENCOUNTER — Ambulatory Visit: Payer: Medicare HMO | Admitting: Family

## 2017-03-18 ENCOUNTER — Ambulatory Visit: Payer: Medicare HMO | Admitting: Family

## 2017-03-22 ENCOUNTER — Encounter: Payer: Self-pay | Admitting: Family

## 2017-03-22 ENCOUNTER — Other Ambulatory Visit: Payer: Self-pay | Admitting: Family

## 2017-03-22 ENCOUNTER — Ambulatory Visit: Payer: Medicare HMO | Admitting: Family

## 2017-03-22 VITALS — BP 107/76 | HR 93 | Temp 97.8°F | Resp 16 | Ht 63.0 in | Wt 181.0 lb

## 2017-03-22 DIAGNOSIS — M109 Gout, unspecified: Secondary | ICD-10-CM

## 2017-03-22 DIAGNOSIS — F419 Anxiety disorder, unspecified: Secondary | ICD-10-CM

## 2017-03-22 DIAGNOSIS — L304 Erythema intertrigo: Secondary | ICD-10-CM

## 2017-03-22 DIAGNOSIS — R1013 Epigastric pain: Secondary | ICD-10-CM

## 2017-03-22 DIAGNOSIS — Z79899 Other long term (current) drug therapy: Secondary | ICD-10-CM | POA: Diagnosis not present

## 2017-03-22 DIAGNOSIS — F32A Depression, unspecified: Secondary | ICD-10-CM

## 2017-03-22 DIAGNOSIS — F329 Major depressive disorder, single episode, unspecified: Secondary | ICD-10-CM

## 2017-03-22 LAB — CBC WITH DIFFERENTIAL/PLATELET
BASOS ABS: 0.1 10*3/uL (ref 0.0–0.1)
Basophils Relative: 0.7 % (ref 0.0–3.0)
EOS PCT: 0.9 % (ref 0.0–5.0)
Eosinophils Absolute: 0.1 10*3/uL (ref 0.0–0.7)
HCT: 39.4 % (ref 36.0–46.0)
HEMOGLOBIN: 12.5 g/dL (ref 12.0–15.0)
LYMPHS ABS: 1 10*3/uL (ref 0.7–4.0)
LYMPHS PCT: 10.7 % — AB (ref 12.0–46.0)
MCHC: 31.8 g/dL (ref 30.0–36.0)
MCV: 89.7 fl (ref 78.0–100.0)
MONOS PCT: 5.6 % (ref 3.0–12.0)
Monocytes Absolute: 0.5 10*3/uL (ref 0.1–1.0)
NEUTROS PCT: 82.1 % — AB (ref 43.0–77.0)
Neutro Abs: 7.3 10*3/uL (ref 1.4–7.7)
Platelets: 237 10*3/uL (ref 150.0–400.0)
RBC: 4.39 Mil/uL (ref 3.87–5.11)
RDW: 15.5 % (ref 11.5–15.5)
WBC: 8.9 10*3/uL (ref 4.0–10.5)

## 2017-03-22 LAB — COMPREHENSIVE METABOLIC PANEL
ALBUMIN: 3.9 g/dL (ref 3.5–5.2)
ALK PHOS: 62 U/L (ref 39–117)
ALT: 5 U/L (ref 0–35)
AST: 12 U/L (ref 0–37)
BUN: 12 mg/dL (ref 6–23)
CALCIUM: 9 mg/dL (ref 8.4–10.5)
CHLORIDE: 101 meq/L (ref 96–112)
CO2: 26 mEq/L (ref 19–32)
Creatinine, Ser: 1.38 mg/dL — ABNORMAL HIGH (ref 0.40–1.20)
GFR: 47.58 mL/min — AB (ref >60.00)
Glucose, Bld: 90 mg/dL (ref 70–99)
POTASSIUM: 4.3 meq/L (ref 3.5–5.1)
SODIUM: 136 meq/L (ref 135–145)
TOTAL PROTEIN: 7.6 g/dL (ref 6.0–8.3)
Total Bilirubin: 0.4 mg/dL (ref 0.2–1.2)

## 2017-03-22 LAB — URIC ACID: URIC ACID, SERUM: 8.4 mg/dL — AB (ref 2.4–7.0)

## 2017-03-22 LAB — LIPASE: LIPASE: 14 U/L (ref 11.0–59.0)

## 2017-03-22 MED ORDER — NYSTATIN 100000 UNIT/GM EX POWD: Freq: Two times a day (BID) | CUTANEOUS | 1 refills | Status: DC

## 2017-03-22 MED ORDER — DIAZEPAM 10 MG PO TABS
10.0000 mg | ORAL_TABLET | Freq: Two times a day (BID) | ORAL | 0 refills | Status: DC | PRN
Start: 2017-03-22 — End: 2017-04-06

## 2017-03-22 MED ORDER — ESCITALOPRAM OXALATE 20 MG PO TABS: 20.0000 mg | ORAL_TABLET | Freq: Every day | ORAL | 1 refills | Status: DC

## 2017-03-22 NOTE — Progress Notes (Signed)
Subjective:    Patient ID: Veronica Wolf, female    DOB: 1940/12/19, 76 y.o.   MRN: [id number]  HPI  Pt is a 76 yr old female who presents today for follow up.  Epigastric pain- last visit we added protonix and referred her back to her GI (Dr. Madilyn FiremanHayes). H pylori breath test was ordered but not performed.  Reports that her symptoms resolved on ppi but ran out a few days ago and epigastric discomfort resolved.  Hand pain- last visit we checked a uric acid level due to hx of gout. Pt was maintained on allopurinol. She did not complete lab work last visit. Reports hand pain is improved.    Wt Readings from Last 3 Encounters:  03/22/17 181 lb (82.1 kg)  02/28/17 185 lb 3.2 oz (84 kg)  12/22/16 181 lb 10.5 oz (82.4 kg)   Anxiety- uses valium prn and citalopram.  Reports that her anxiety has worsened recently.  Stays in bed till 1AM sometimes. Goes to bed at 11 or 12.  Feels concerned about depression.  Feels unmotivated.  Review of Systems  See HPI  Past Medical History:  Diagnosis Date  . COPD (chronic obstructive pulmonary disease) (HCC)   . Depression   . GERD (gastroesophageal reflux disease)   . Hypertension      Social History   Socioeconomic History  . Marital status: Widowed    Spouse name: Not on file  . Number of children: Not on file  . Years of education: Not on file  . Highest education level: Not on file  Social Needs  . Financial resource strain: Not on file  . Food insecurity - worry: Not on file  . Food insecurity - inability: Not on file  . Transportation needs - medical: Not on file  . Transportation needs - non-medical: Not on file  Occupational History  . Not on file  Tobacco Use  . Smoking status: Former Games developermoker  . Smokeless tobacco: Never Used  Substance and Sexual Activity  . Alcohol use: No  . Drug use: No  . Sexual activity: Not on file  Other Topics Concern  . 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 ABDOMINAL HYSTERECTOMY      No family history on file.  No Known Allergies  Current Outpatient Medications on File Prior to Visit  Medication Sig Dispense Refill  . acetaminophen (TYLENOL) 325 MG tablet Take 650 mg by mouth every 6 (six) hours as needed for mild pain or moderate pain.    Marland Kitchen. albuterol (PROVENTIL HFA;VENTOLIN HFA) 108 (90 Base) MCG/ACT inhaler Inhale 2 puffs into the lungs every 6 (six) hours as needed for wheezing or shortness of breath. 1 Inhaler 0  . albuterol (PROVENTIL) (2.5 MG/3ML) 0.083% nebulizer solution Take 3 mLs (2.5 mg total) by nebulization every 6 (six) hours as needed for wheezing or shortness of breath. 150 mL 1  . allopurinol (ZYLOPRIM) 100 MG tablet Take 100 mg by mouth daily.    . citalopram (CELEXA) 40 MG tablet Take 40 mg by mouth daily.    . diazepam (VALIUM) 10 MG tablet Take 10 mg by mouth every 6 (six) hours as needed for anxiety.    . dicyclomine (BENTYL) 10 MG capsule Take 1 capsule (10 mg total) by mouth 3 (three) times  daily as needed for spasms. 15 capsule 0  . diphenoxylate-atropine (LOMOTIL) 2.5-0.025 MG tablet Take 1 tablet by mouth daily as needed.    . ondansetron (ZOFRAN) 4 MG tablet Take 4 mg by mouth every 4 (four) hours as needed.    . pantoprazole (PROTONIX) 40 MG tablet Take 1 tablet (40 mg total) by mouth daily. 30 tablet 2  . ranitidine (ZANTAC) 150 MG tablet Take 150 mg by mouth daily.     No current facility-administered medications on file prior to visit.     BP 107/76 (BP Location: Right Arm, Patient Position: Sitting, Cuff Size: Small)   Pulse 93   Temp 97.8 F (36.6 C) (Oral)   Resp 16   Ht 5\' 3"  (1.6 m)   Wt 181 lb (82.1 kg)   SpO2 95%   BMI 32.06 kg/m        Objective:   Physical Exam  Constitutional: She is oriented to person, place, and time. She appears well-developed and well-nourished.  HENT:    Head: Normocephalic and atraumatic.  Cardiovascular: Normal rate, regular rhythm and normal heart sounds.  No murmur heard. Pulmonary/Chest: Effort normal and breath sounds normal. No respiratory distress. She has no wheezes.  Abdominal: Soft. She exhibits no distension.  Positive epigastric tenderness without guarding or masses noted.  Neurological: She is alert and oriented to person, place, and time.  Psychiatric: She has a normal mood and affect. Her behavior is normal. Judgment and thought content normal.  skin: Erythematous fungal rash noted beneath breasts       Assessment & Plan:  Intertrigo-will prescribe nystatin powder twice daily.  Depression-uncontrolled.  Will stop citalopram and begin Lexapro.  If no significant improvement with this medication change would consider adding low-dose SSRI to her regimen.  Anxiety-this is stable.  She is requesting a refill on her Valium.  A controlled substance contract is filled today and a urine drug screen will be obtained.   Epigastric pain-I have advised patient to schedule an appointment with Dr. Madilyn FiremanHayes her GI doctor.  It appears that they tried to reach her to schedule a visit back in November but she did not return their call.  It is too late to complete her H. pylori breath test since she has been on a proton pump inhibitor.  She did not complete her other laboratory work last visit and this will be completed today.  Hand pain/gout- Uric acid level elevated.  Pain in improved today. Will increase allopurinol dose from 100mg  to 200mg  daily.

## 2017-03-22 NOTE — Telephone Encounter (Signed)
Please contact patient and let her know that I have reviewed her lab work.  Her gout blood test is elevated.  I would recommend that she increase her allopurinol from 100 mg to 200 mg once daily.  So she will take 2 tablets instead of 1.  Her kidney function has improved since last visit.  Also her anemia is now resolved.  Blood count is normal.

## 2017-03-22 NOTE — Addendum Note (Signed)
Addended by: [name]'SULLIVAN, Aliani Caccavale on: 03/22/2017 12:07 PM   Modules accepted: Orders

## 2017-03-22 NOTE — Patient Instructions (Addendum)
Please contact Dr. Madilyn FiremanHayes' office to schedule an appointment.  [phone] Stop citalopram, start lexapro.   Add nystatin powder for rash.

## 2017-03-23 MED ORDER — ALLOPURINOL 100 MG PO TABS: 200.0000 mg | ORAL_TABLET | Freq: Every day | ORAL | 6 refills | Status: DC

## 2017-03-23 NOTE — Telephone Encounter (Signed)
Notified pt and she voices understanding. 

## 2017-03-30 LAB — PAIN MGMT, PROFILE 8 W/CONF, U
6 ACETYLMORPHINE: NEGATIVE ng/mL (ref <10)
ALPHAHYDROXYTRIAZOLAM: NEGATIVE ng/mL (ref <50)
Alcohol Metabolites: NEGATIVE ng/mL (ref <500)
Alphahydroxyalprazolam: NEGATIVE ng/mL (ref <25)
Alphahydroxymidazolam: NEGATIVE ng/mL (ref <50)
Aminoclonazepam: NEGATIVE ng/mL (ref <25)
Amphetamines: NEGATIVE ng/mL (ref <500)
BENZODIAZEPINES: POSITIVE ng/mL — AB (ref <100)
Buprenorphine, Urine: NEGATIVE ng/mL (ref <5)
CREATININE: 222.3 mg/dL
Cocaine Metabolite: NEGATIVE ng/mL (ref <150)
Hydroxyethylflurazepam: NEGATIVE ng/mL (ref <50)
LORAZEPAM: NEGATIVE ng/mL (ref <50)
MDMA: NEGATIVE ng/mL (ref <500)
Marijuana Metabolite: NEGATIVE ng/mL (ref <20)
OPIATES: NEGATIVE ng/mL (ref <100)
OXYCODONE: NEGATIVE ng/mL (ref <100)
Oxidant: NEGATIVE ug/mL (ref <200)
Temazepam: >1000 ng/mL — ABNORMAL HIGH (ref <50)
pH: 5.77 (ref 4.5–9.0)

## 2017-03-31 ENCOUNTER — Telehealth: Payer: Self-pay | Admitting: *Deleted

## 2017-03-31 NOTE — Telephone Encounter (Signed)
Received Medical records from Dr. Concepcion ElkAvbuere; forwarded to provider/SLS 12/27

## 2017-04-04 ENCOUNTER — Encounter: Payer: Self-pay | Admitting: Family

## 2017-04-04 DIAGNOSIS — E785 Hyperlipidemia, unspecified: Secondary | ICD-10-CM | POA: Insufficient documentation

## 2017-04-04 DIAGNOSIS — J309 Allergic rhinitis, unspecified: Secondary | ICD-10-CM | POA: Insufficient documentation

## 2017-04-04 DIAGNOSIS — N183 Chronic kidney disease, stage 3 (moderate): Secondary | ICD-10-CM

## 2017-04-04 DIAGNOSIS — N189 Chronic kidney disease, unspecified: Secondary | ICD-10-CM

## 2017-04-04 HISTORY — DX: Chronic kidney disease, unspecified: N18.9

## 2017-04-05 ENCOUNTER — Emergency Department (HOSPITAL_BASED_OUTPATIENT_CLINIC_OR_DEPARTMENT_OTHER)
Admission: EM | Admit: 2017-04-05 | Discharge: 2017-04-05 | Disposition: A | Discharge status: Home / Self Care | Payer: Medicare HMO | Attending: Emergency Medicine | Admitting: Emergency Medicine

## 2017-04-05 ENCOUNTER — Emergency Department (HOSPITAL_BASED_OUTPATIENT_CLINIC_OR_DEPARTMENT_OTHER): Payer: Medicare HMO

## 2017-04-05 ENCOUNTER — Encounter (HOSPITAL_BASED_OUTPATIENT_CLINIC_OR_DEPARTMENT_OTHER): Payer: Self-pay

## 2017-04-05 ENCOUNTER — Other Ambulatory Visit: Payer: Self-pay

## 2017-04-05 DIAGNOSIS — Z23 Encounter for immunization: Secondary | ICD-10-CM | POA: Insufficient documentation

## 2017-04-05 DIAGNOSIS — K219 Gastro-esophageal reflux disease without esophagitis: Secondary | ICD-10-CM | POA: Insufficient documentation

## 2017-04-05 DIAGNOSIS — S81812A Laceration without foreign body, left lower leg, initial encounter: Secondary | ICD-10-CM | POA: Insufficient documentation

## 2017-04-05 DIAGNOSIS — M25551 Pain in right hip: Secondary | ICD-10-CM | POA: Insufficient documentation

## 2017-04-05 DIAGNOSIS — R110 Nausea: Secondary | ICD-10-CM | POA: Insufficient documentation

## 2017-04-05 DIAGNOSIS — Y998 Other external cause status: Secondary | ICD-10-CM | POA: Insufficient documentation

## 2017-04-05 DIAGNOSIS — Y92002 Bathroom of unspecified non-institutional (private) residence single-family (private) house as the place of occurrence of the external cause: Secondary | ICD-10-CM | POA: Insufficient documentation

## 2017-04-05 DIAGNOSIS — M109 Gout, unspecified: Secondary | ICD-10-CM | POA: Insufficient documentation

## 2017-04-05 DIAGNOSIS — I129 Hypertensive chronic kidney disease with stage 1 through stage 4 chronic kidney disease, or unspecified chronic kidney disease: Secondary | ICD-10-CM | POA: Diagnosis not present

## 2017-04-05 DIAGNOSIS — R1013 Epigastric pain: Secondary | ICD-10-CM | POA: Diagnosis not present

## 2017-04-05 DIAGNOSIS — R42 Dizziness and giddiness: Secondary | ICD-10-CM | POA: Diagnosis not present

## 2017-04-05 DIAGNOSIS — Y9389 Activity, other specified: Secondary | ICD-10-CM | POA: Insufficient documentation

## 2017-04-05 DIAGNOSIS — S0990XA Unspecified injury of head, initial encounter: Secondary | ICD-10-CM | POA: Diagnosis not present

## 2017-04-05 DIAGNOSIS — W01190A Fall on same level from slipping, tripping and stumbling with subsequent striking against furniture, initial encounter: Secondary | ICD-10-CM | POA: Insufficient documentation

## 2017-04-05 DIAGNOSIS — N189 Chronic kidney disease, unspecified: Secondary | ICD-10-CM | POA: Insufficient documentation

## 2017-04-05 LAB — COMPREHENSIVE METABOLIC PANEL
ALBUMIN: 3.1 g/dL — AB (ref 3.5–5.0)
ALT: 7 U/L — ABNORMAL LOW (ref 14–54)
ANION GAP: 9 (ref 5–15)
AST: 15 U/L (ref 15–41)
Alkaline Phosphatase: 64 U/L (ref 38–126)
BUN: 11 mg/dL (ref 6–20)
CO2: 23 mmol/L (ref 22–32)
Calcium: 8.5 mg/dL — ABNORMAL LOW (ref 8.9–10.3)
Chloride: 102 mmol/L (ref 101–111)
Creatinine, Ser: 1.22 mg/dL — ABNORMAL HIGH (ref 0.44–1.00)
GFR calc Af Amer: 48 mL/min — ABNORMAL LOW (ref >60)
GFR calc non Af Amer: 42 mL/min — ABNORMAL LOW (ref >60)
GLUCOSE: 111 mg/dL — AB (ref 65–99)
POTASSIUM: 3.4 mmol/L — AB (ref 3.5–5.1)
SODIUM: 134 mmol/L — AB (ref 135–145)
Total Bilirubin: 0.4 mg/dL (ref 0.3–1.2)
Total Protein: 7.2 g/dL (ref 6.5–8.1)

## 2017-04-05 LAB — CBC WITH DIFFERENTIAL/PLATELET
BASOS ABS: 0 10*3/uL (ref 0.0–0.1)
BASOS PCT: 0 %
EOS ABS: 0.1 10*3/uL (ref 0.0–0.7)
Eosinophils Relative: 1 %
HCT: 34.3 % — ABNORMAL LOW (ref 36.0–46.0)
Hemoglobin: 10.7 g/dL — ABNORMAL LOW (ref 12.0–15.0)
Lymphocytes Relative: 14 %
Lymphs Abs: 1.5 10*3/uL (ref 0.7–4.0)
MCH: 27.4 pg (ref 26.0–34.0)
MCHC: 31.2 g/dL (ref 30.0–36.0)
MCV: 87.7 fL (ref 78.0–100.0)
MONO ABS: 0.8 10*3/uL (ref 0.1–1.0)
MONOS PCT: 7 %
NEUTROS PCT: 78 %
Neutro Abs: 8.6 10*3/uL — ABNORMAL HIGH (ref 1.7–7.7)
Platelets: 276 10*3/uL (ref 150–400)
RBC: 3.91 MIL/uL (ref 3.87–5.11)
RDW: 14.4 % (ref 11.5–15.5)
WBC: 11.1 10*3/uL — ABNORMAL HIGH (ref 4.0–10.5)

## 2017-04-05 LAB — URINALYSIS, ROUTINE W REFLEX MICROSCOPIC
Bilirubin Urine: NEGATIVE
Glucose, UA: NEGATIVE mg/dL
Hgb urine dipstick: NEGATIVE
Ketones, ur: NEGATIVE mg/dL
LEUKOCYTES UA: NEGATIVE
Nitrite: NEGATIVE
PH: 6 (ref 5.0–8.0)
Protein, ur: NEGATIVE mg/dL
SPECIFIC GRAVITY, URINE: 1.01 (ref 1.005–1.030)

## 2017-04-05 LAB — TROPONIN I: Troponin I: <0.03 ng/mL (ref <0.03)

## 2017-04-05 MED ORDER — MORPHINE SULFATE (PF) 4 MG/ML IV SOLN
4.0000 mg | Freq: Once | INTRAVENOUS | Status: AC
  Administered 2017-04-05: 4 mg via INTRAVENOUS
  Filled 2017-04-05: qty 1

## 2017-04-05 MED ORDER — HYDROCODONE-ACETAMINOPHEN 5-325 MG PO TABS
1.0000 | ORAL_TABLET | Freq: Once | ORAL | Status: AC
  Administered 2017-04-05: 1 via ORAL
  Filled 2017-04-05: qty 1

## 2017-04-05 MED ORDER — MECLIZINE HCL 25 MG PO TABS
25.0000 mg | ORAL_TABLET | Freq: Once | ORAL | Status: AC
  Administered 2017-04-05: 25 mg via ORAL
  Filled 2017-04-05: qty 1

## 2017-04-05 MED ORDER — SODIUM CHLORIDE 0.9 % IV BOLUS (SEPSIS)
500.0000 mL | Freq: Once | INTRAVENOUS | Status: AC
  Administered 2017-04-05: 500 mL via INTRAVENOUS

## 2017-04-05 MED ORDER — ONDANSETRON 4 MG PO TBDP
4.0000 mg | ORAL_TABLET | Freq: Once | ORAL | Status: AC
  Administered 2017-04-05: 4 mg via ORAL

## 2017-04-05 MED ORDER — HYDROCODONE-ACETAMINOPHEN 5-325 MG PO TABS: 1.0000 | ORAL_TABLET | Freq: Four times a day (QID) | ORAL | 0 refills | Status: DC | PRN

## 2017-04-05 MED ORDER — ONDANSETRON 4 MG PO TBDP
ORAL_TABLET | ORAL | Status: AC
  Administered 2017-04-05: 4 mg via ORAL
  Filled 2017-04-05: qty 1

## 2017-04-05 MED ORDER — PREDNISONE 20 MG PO TABS
40.0000 mg | ORAL_TABLET | Freq: Once | ORAL | Status: AC
  Administered 2017-04-05: 40 mg via ORAL
  Filled 2017-04-05: qty 2

## 2017-04-05 MED ORDER — MECLIZINE HCL 25 MG PO TABS: 25.0000 mg | ORAL_TABLET | Freq: Three times a day (TID) | ORAL | 0 refills | Status: DC | PRN

## 2017-04-05 MED ORDER — TETANUS-DIPHTH-ACELL PERTUSSIS 5-2.5-18.5 LF-MCG/0.5 IM SUSP
0.5000 mL | Freq: Once | INTRAMUSCULAR | Status: AC
  Administered 2017-04-05: 0.5 mL via INTRAMUSCULAR
  Filled 2017-04-05: qty 0.5

## 2017-04-05 MED ORDER — PREDNISONE 20 MG PO TABS: 40.0000 mg | ORAL_TABLET | Freq: Every day | ORAL | 0 refills | Status: DC

## 2017-04-05 NOTE — ED Provider Notes (Signed)
[facility] EMERGENCY DEPARTMENT Provider Note   CSN: [id number] Arrival date & time: 04/05/17  [id number]     History   Chief Complaint Chief Complaint  Patient presents with  . Dizziness  . Laceration    HPI Veronica Wolf is a 77 y.o. female.  Patient is a 77 year old female with a history of chronic kidney disease, hypertension, hyperlipidemia, gout, GERD who is presenting today with several complaints.  Patient states for the last 1 week she has had a flare of gout in her right foot.  This is happened before and it always seems to be in the right foot.  She is also noticed right hip pain as well.  She is increased her dose of allopurinol per her doctor which she takes daily but is taken nothing else for the pain or inflammation.  She has been laying in bed for the last week due to the pain caused by trying to walk.  She is also noticed feeling dizzy which she describes as an imbalance sensation may be even some rotation and almost feels like she might pass out.  It is made significantly worse by standing and walking.  This morning she got up to go to the bathroom and her right foot and hip are hurting her and she began to feel very dizzy which made her feel nauseated and she fell coming out of the bathroom hitting her left leg on a piece of furniture and hitting her head on the side of the wall.  She denies any headache, blurred vision, unilateral numbness or weakness but is complaining of 10 out of 10 right foot pain that was there prior to the fall 5 out of 10 right hip pain that was there prior to the fall.  She has noticed decreased urine output but denies any dysuria or frequency.  Some intermittent epigastric discomfort which she states is her typical GERD but no chest pain or shortness of breath.  No fevers.     The history is provided by the patient.  Dizziness  Quality:  Head spinning and imbalance Severity:  Moderate Onset quality:  Gradual Duration:  1 week Timing:   Intermittent Progression:  Worsening Chronicity:  Recurrent Relieved by:  Being still Worsened by:  Movement, sitting upright, standing up and turning head Ineffective treatments:  Fluids and lying down Associated symptoms: nausea   Associated symptoms: no chest pain, no shortness of breath, no tinnitus, no vision changes and no vomiting   Risk factors comment:  Hx of GERD, COPD, CKD, HTN, HLD Laceration      Past Medical History:  Diagnosis Date  . Allergic rhinitis   . Anxiety   . CKD (chronic kidney disease) 04/04/2017  . COPD (chronic obstructive pulmonary disease) (HCC)   . Depression   . GERD (gastroesophageal reflux disease)   . Hyperlipidemia   . Hypertension   . Peptic ulcer 01/04/2003    Patient Active Problem List   Diagnosis Date Noted  . CKD (chronic kidney disease) 04/04/2017  . Hyperlipidemia   . Allergic rhinitis   . Gout 02/28/2017  . Depression with anxiety 02/28/2017  . Epigastric pain 12/22/2016  . COPD with chronic bronchitis (HCC) 12/22/2016  . GERD (gastroesophageal reflux disease) 12/22/2016  . DOE (dyspnea on exertion) 12/22/2016  . Precordial chest pain     Past Surgical History:  Procedure Laterality Date  . ABDOMINAL HYSTERECTOMY      OB History    No data available  Home Medications    Prior to Admission medications   Medication Sig Start Date End Date Taking? Authorizing Provider  allopurinol (ZYLOPRIM) 100 MG tablet Take 2 tablets (200 mg total) by mouth daily. 03/23/17  Yes [name], NP  pantoprazole (PROTONIX) 40 MG tablet Take 1 tablet (40 mg total) by mouth daily. 03/02/17  Yes [name], NP  acetaminophen (TYLENOL) 325 MG tablet Take 650 mg by mouth every 6 (six) hours as needed for mild pain or moderate pain.    [provider]  albuterol (PROVENTIL HFA;VENTOLIN HFA) 108 (90 Base) MCG/ACT inhaler Inhale 2 puffs into the lungs every 6 (six) hours as needed for wheezing or shortness of  breath. 02/28/17   [name], NP  albuterol (PROVENTIL) (2.5 MG/3ML) 0.083% nebulizer solution Take 3 mLs (2.5 mg total) by nebulization every 6 (six) hours as needed for wheezing or shortness of breath. 03/03/17   [name], NP  diazepam (VALIUM) 10 MG tablet Take 1 tablet (10 mg total) by mouth every 12 (twelve) hours as needed for anxiety. 03/22/17   [name], NP  dicyclomine (BENTYL) 10 MG capsule Take 1 capsule (10 mg total) by mouth 3 (three) times daily as needed for spasms. 12/24/16   Zannie Cove, MD  diphenoxylate-atropine (LOMOTIL) 2.5-0.025 MG tablet Take 1 tablet by mouth daily as needed. 11/16/16   [provider]  escitalopram (LEXAPRO) 20 MG tablet Take 1 tablet (20 mg total) by mouth daily. 03/22/17   [name], NP  nystatin (MYCOSTATIN/NYSTOP) powder Apply topically 2 (two) times daily. 03/22/17   [name], NP  ondansetron (ZOFRAN) 4 MG tablet Take 4 mg by mouth every 4 (four) hours as needed. 12/20/16   [provider]  ranitidine (ZANTAC) 150 MG tablet Take 150 mg by mouth daily.    [provider]    Family History No family history on file.  Social History Social History   Tobacco Use  . Smoking status: Former Games developer  . Smokeless tobacco: Never Used  Substance Use Topics  . Alcohol use: No    Comment: has previous hx of ETOH abuse, quit 2014  . Drug use: No     Allergies   Ibuprofen   Review of Systems Review of Systems  HENT: Negative for tinnitus.   Respiratory: Negative for shortness of breath.   Cardiovascular: Negative for chest pain.  Gastrointestinal: Positive for nausea. Negative for vomiting.  Neurological: Positive for dizziness.  All other systems reviewed and are negative.    Physical Exam Updated Vital Signs BP 130/67 (BP Location: Left Arm)   Pulse 85   Temp 98.7 F (37.1 C) (Oral)   Resp 20   Ht 5\' 3"  (1.6 m)   Wt 82.1 kg (181 lb)   SpO2 97%   BMI  32.06 kg/m   Physical Exam  Constitutional: She is oriented to person, place, and time. She appears well-developed and well-nourished. She appears distressed.  Appears in pain  HENT:  Head: Normocephalic. Head is with contusion.    Mouth/Throat: Oropharynx is clear and moist.  Eyes: Conjunctivae and EOM are normal. Pupils are equal, round, and reactive to light.  Neck: Normal range of motion. Neck supple.  Cardiovascular: Normal rate, regular rhythm and intact distal pulses.  No murmur heard. Pulmonary/Chest: Effort normal and breath sounds normal. No respiratory distress. She has no wheezes. She has no rales.  Abdominal: Soft. She exhibits no distension. There is no tenderness. There is no rebound and no guarding.  Musculoskeletal: Normal range of motion. She exhibits edema and tenderness.       Legs:      Right foot: There is tenderness, bony tenderness and swelling.       Feet:  Neurological: She is alert and oriented to person, place, and time. She has normal strength. No cranial nerve deficit or sensory deficit.  5/5 strength in bilateral upper and lower ext.  No facial droop and normal speech.    Skin: Skin is warm and dry. Capillary refill takes less than 2 seconds. No rash noted. No erythema.  Psychiatric: She has a normal mood and affect. Her behavior is normal.  Nursing note and vitals reviewed.    ED Treatments / Results  Labs (all labs ordered are listed, but only abnormal results are displayed) Labs Reviewed  CBC WITH DIFFERENTIAL/PLATELET - Abnormal; Notable for the following components:      Result Value   WBC 11.1 (*)    Hemoglobin 10.7 (*)    HCT 34.3 (*)    Neutro Abs 8.6 (*)    All other components within normal limits  COMPREHENSIVE METABOLIC PANEL - Abnormal; Notable for the following components:   Sodium 134 (*)    Potassium 3.4 (*)    Glucose, Bld 111 (*)    Creatinine, Ser 1.22 (*)    Calcium 8.5 (*)    Albumin 3.1 (*)    ALT 7 (*)    GFR calc  non Af Amer 42 (*)    GFR calc Af Amer 48 (*)    All other components within normal limits  URINALYSIS, ROUTINE W REFLEX MICROSCOPIC - Abnormal; Notable for the following components:   Color, Urine STRAW (*)    All other components within normal limits  TROPONIN I    EKG  EKG Interpretation  Date/Time:  Tuesday April 05 2017 07:23:37 EST Ventricular Rate:  79 PR Interval:    QRS Duration: 84 QT Interval:  448 QTC Calculation: 514 R Axis:   28 Text Interpretation:  Sinus rhythm Borderline T abnormalities, anterior leads Prolonged QT interval No significant change since last tracing Confirmed by Gwyneth Sprout ([id number]) on 04/05/2017 7:26:55 AM       Radiology Ct Head Wo Contrast  Result Date: 04/05/2017 CLINICAL DATA:  Nausea.  Fall.  Dizziness. EXAM: CT HEAD WITHOUT CONTRAST TECHNIQUE: Contiguous axial images were obtained from the base of the skull through the vertex without intravenous contrast. COMPARISON:  None. FINDINGS: Brain: No evidence of parenchymal hemorrhage or extra-axial fluid collection. No mass lesion, mass effect, or midline shift. No CT evidence of acute infarction. Generalized cerebral volume loss. Nonspecific Mild subcortical and periventricular white matter hypodensity, most in keeping with chronic small vessel ischemic change. No ventriculomegaly. Vascular: No acute abnormality. Skull: No evidence of calvarial fracture. Sinuses/Orbits: Mucoperiosteal thickening in the visualized bilateral paranasal sinuses. Other:  The mastoid air cells are unopacified. IMPRESSION: 1. No evidence of acute intracranial abnormality. No evidence of calvarial fracture. 2. Generalized cerebral volume loss and mild chronic small vessel ischemic changes in the cerebral white matter. Electronically Signed   By: Delbert Phenix M.D.   On: 04/05/2017 07:55    Procedures Procedures (including critical care time)  LACERATION REPAIR Performed by: [facility] Authorized by: Gwyneth Sprout Consent: Verbal consent obtained. Risks and benefits: risks, benefits and alternatives were discussed Consent given by: patient Patient identity confirmed: provided demographic data Prepped and Draped in normal sterile fashion Wound explored  Laceration Location: left lower leg  Laceration Length: 2 cm and 1.5 cm  No Foreign Bodies seen or palpated  Anesthesia: none Irrigation method: scrub Amount of cleaning: standard  Skin closure: dermabond and steri-strips   Patient tolerance: Patient tolerated the procedure well with no immediate complications.   Medications Ordered in ED Medications  morphine 4 MG/ML injection 4 mg (not administered)  sodium chloride 0.9 % bolus 500 mL (not administered)  Tdap (BOOSTRIX) injection 0.5 mL (not administered)  ondansetron (ZOFRAN-ODT) disintegrating tablet 4 mg (4 mg Oral Given 04/05/17 0715)     Initial Impression / Assessment and Plan / ED Course  I have reviewed the triage vital signs and the nursing notes.  Pertinent labs & imaging results that were available during my care of the patient were reviewed by me and considered in my medical decision making (see chart for details).     Patient presenting today with multiple complaints.  Patient does appear to have a gout flare with redness, warmth and swelling of her right foot with prior history of the same.  Patient is on allopurinol was increased her dose but is not on steroids or any other type of anti-inflammatories.  Low suspicion for septic joint as patient has had no infectious symptoms.  Due to her chronic kidney disease and ibuprofen allergy as well as peptic ulcer disease she may benefit from steroids.  She was given pain control. Secondly patient is complaining of feeling dizzy which caused her fall today in addition to the pain in her foot.  Dizziness sounds more vertiginous in nature opposed to cardiac.  Patient's vital signs are within normal limits.  She denies any  chest pain or shortness of breath.  She is unable to walk but other bedside testing do not show any evidence of cerebellar ataxia concerning for stroke.  Will ensure no electrolyte abnormalities, worsening renal function and due to injuring her head during her fall will CT her head to ensure no intracranial injury.  Patient was given IV fluids, Zofran and pain control.  We will also give meclizine for her dizziness. Lastly patient's wound from her fall on her left lower leg both are superficial.  The skin is tight in these areas and if wound were closed to be closed under tension with concern for poor healing.  Wounds are clean and well repair with Dermabond and Steri-Strips.  8:01 AM  Labs are reassuring and Head CT neg for trauma or other acute changes.  Pt feels much better after morphine.  She was given meclizine and will re-evaluate.    9:46 AM Pt feeling better after meclizine and dizziness is gone.  Will d/c with meclizine, prednisone and pain meds.  Final Clinical Impressions(s) / ED Diagnoses   Final diagnoses:  Vertigo  Acute gout of right foot, unspecified cause  Laceration of left lower leg, initial encounter    ED Discharge Orders        Ordered    meclizine (ANTIVERT) 25 MG tablet  3 times daily PRN     04/05/17 0949    predniSONE (DELTASONE) 20 MG tablet  Daily     04/05/17 0949    HYDROcodone-acetaminophen (NORCO/VICODIN) 5-325 MG tablet  Every 6 hours PRN     04/05/17 0949       Gwyneth Sprout, MD 04/05/17 [phone]

## 2017-04-05 NOTE — ED Triage Notes (Addendum)
Pt reports dizziness for 1 week with nausea. Pt reports she got up to use the restroom and tripped and fell. Pt injured her left ankle and has 2 skin tears on her ankle. Pt reports she has had abdominal pain for months and is supposed to see her doctor about it. Pt reports gout pain in her right foot. Pt reports right hip pain also.

## 2017-04-05 NOTE — Discharge Instructions (Signed)
For your constipation avoid junk food but you can also take Miralax 1 scoop in 8oz of liquid for a few days to get things moving and then use a stool softener like doculax or colace to keep things moving

## 2017-04-05 NOTE — ED Notes (Signed)
Pt on monitor 

## 2017-04-05 NOTE — ED Notes (Signed)
ED Provider at bedside. 

## 2017-04-06 ENCOUNTER — Other Ambulatory Visit: Payer: Self-pay | Admitting: Family

## 2017-04-08 NOTE — Telephone Encounter (Signed)
Please advise?  Last diazepam RX: 03/22/17, #30 Last OV: 03/22/17 Next OV: due 04/22/17 but not scheduled UDS: 03/22/17.  PLEASE REVIEW RECENT UDS. TEMAZEPAM POSITIVE BUT NOT ON MED LIST. ?DUE TO DIAZEPAM CSC: 03/22/17  CSR: Reviewed profile. It appears pt received hydrocodone on 04/06/17 from ER visit?

## 2017-04-08 NOTE — Telephone Encounter (Signed)
Metabolites are consistent with Valium.  She did receive an acute prescription for hydrocodone from the ED which I have reviewed.  Refill sent.

## 2017-04-22 ENCOUNTER — Other Ambulatory Visit: Payer: Self-pay

## 2017-05-13 ENCOUNTER — Other Ambulatory Visit: Payer: Self-pay | Admitting: Family

## 2017-05-16 NOTE — Telephone Encounter (Signed)
Requesting: valium Contract: 03/22/17 UDS:03/22/17  Last Visit: 03/22/17 Next Visit: none Last Refill: 03/23/17   Please Advise Refill not appropriate. Just given 6 additional refills in 03/2017

## 2017-05-17 ENCOUNTER — Other Ambulatory Visit: Payer: Self-pay

## 2017-05-17 NOTE — Telephone Encounter (Signed)
Requesting: valium Contract: 03/22/17 UDS:03/22/17  Moderate risk Last Visit: 03/22/17 Next Visit: none Last Refill: 04/08/17  Please Advise

## 2017-05-19 MED ORDER — DIAZEPAM 10 MG PO TABS: 10.0000 mg | ORAL_TABLET | Freq: Two times a day (BID) | ORAL | 0 refills | Status: DC | PRN

## 2017-05-19 NOTE — Telephone Encounter (Signed)
Reviewed [place] controlled substance registry. Refills appropriate.  

## 2017-05-23 ENCOUNTER — Telehealth: Payer: Self-pay | Admitting: Family

## 2017-05-23 NOTE — Telephone Encounter (Signed)
Pt was seen in ER on 04/05/17 for dizziness and prescribed meclizine. Pt last seen by PCP 03/2017 and was advised 1 month follow that has also not been scheduled.  Please advise refill?

## 2017-05-23 NOTE — Telephone Encounter (Signed)
Copied from CRM [phone]. Topic: Quick Communication - See Telephone Encounter >> May 23, 2017 12:57 PM Windy KalataMichael, Yakub Lodes L, NT wrote: CRM for notification. See Telephone encounter for:  05/23/17.  Patient was seen in the [facility] on 04/05/17 and was prescribed meclizine 25mg . Patient is requesting a refill on that medication.  [facility] [address] [place], [place] - [id number]

## 2017-05-23 NOTE — Telephone Encounter (Signed)
Notified pt and she voices understanding. Follow up scheduled for 05/31/17 at 11am.

## 2017-05-23 NOTE — Telephone Encounter (Signed)
Needs OV prior to additional refills however she should be able to get otc.

## 2017-05-31 ENCOUNTER — Ambulatory Visit: Payer: Medicare HMO | Admitting: Family

## 2017-05-31 ENCOUNTER — Encounter: Payer: Self-pay | Admitting: Family

## 2017-05-31 ENCOUNTER — Telehealth: Payer: Self-pay

## 2017-05-31 ENCOUNTER — Telehealth: Payer: Self-pay | Admitting: *Deleted

## 2017-05-31 VITALS — BP 108/64 | HR 77 | Temp 97.6°F | Resp 16 | Ht 63.0 in | Wt 186.0 lb

## 2017-05-31 DIAGNOSIS — R42 Dizziness and giddiness: Secondary | ICD-10-CM

## 2017-05-31 DIAGNOSIS — R1013 Epigastric pain: Secondary | ICD-10-CM

## 2017-05-31 DIAGNOSIS — R079 Chest pain, unspecified: Secondary | ICD-10-CM | POA: Diagnosis not present

## 2017-05-31 DIAGNOSIS — M1A9XX0 Chronic gout, unspecified, without tophus (tophi): Secondary | ICD-10-CM

## 2017-05-31 DIAGNOSIS — F329 Major depressive disorder, single episode, unspecified: Secondary | ICD-10-CM

## 2017-05-31 DIAGNOSIS — R51 Headache: Secondary | ICD-10-CM

## 2017-05-31 DIAGNOSIS — R519 Headache, unspecified: Secondary | ICD-10-CM

## 2017-05-31 DIAGNOSIS — R0789 Other chest pain: Secondary | ICD-10-CM

## 2017-05-31 DIAGNOSIS — F32A Depression, unspecified: Secondary | ICD-10-CM

## 2017-05-31 LAB — CBC WITH DIFFERENTIAL/PLATELET
BASOS ABS: 0 10*3/uL (ref 0.0–0.1)
Basophils Relative: 0.8 % (ref 0.0–3.0)
Eosinophils Absolute: 0.1 10*3/uL (ref 0.0–0.7)
Eosinophils Relative: 2.3 % (ref 0.0–5.0)
HEMATOCRIT: 37.9 % (ref 36.0–46.0)
HEMOGLOBIN: 12.3 g/dL (ref 12.0–15.0)
LYMPHS PCT: 27.8 % (ref 12.0–46.0)
Lymphs Abs: 1.7 10*3/uL (ref 0.7–4.0)
MCHC: 32.4 g/dL (ref 30.0–36.0)
MCV: 85.5 fl (ref 78.0–100.0)
MONOS PCT: 6.9 % (ref 3.0–12.0)
Monocytes Absolute: 0.4 10*3/uL (ref 0.1–1.0)
NEUTROS ABS: 3.8 10*3/uL (ref 1.4–7.7)
Neutrophils Relative %: 62.2 % (ref 43.0–77.0)
Platelets: 161 10*3/uL (ref 150.0–400.0)
RBC: 4.43 Mil/uL (ref 3.87–5.11)
RDW: 16.9 % — ABNORMAL HIGH (ref 11.5–15.5)
WBC: 6 10*3/uL (ref 4.0–10.5)

## 2017-05-31 LAB — COMPREHENSIVE METABOLIC PANEL
ALK PHOS: 69 U/L (ref 39–117)
ALT: 6 U/L (ref 0–35)
AST: 13 U/L (ref 0–37)
Albumin: 3.4 g/dL — ABNORMAL LOW (ref 3.5–5.2)
BILIRUBIN TOTAL: 0.3 mg/dL (ref 0.2–1.2)
BUN: 15 mg/dL (ref 6–23)
CALCIUM: 8.9 mg/dL (ref 8.4–10.5)
CO2: 30 meq/L (ref 19–32)
Chloride: 105 mEq/L (ref 96–112)
Creatinine, Ser: 1.26 mg/dL — ABNORMAL HIGH (ref 0.40–1.20)
GFR: 52.82 mL/min — AB (ref >60.00)
Glucose, Bld: 79 mg/dL (ref 70–99)
Potassium: 4 mEq/L (ref 3.5–5.1)
Sodium: 141 mEq/L (ref 135–145)
Total Protein: 6.5 g/dL (ref 6.0–8.3)

## 2017-05-31 MED ORDER — MECLIZINE HCL 25 MG PO TABS: 25.0000 mg | ORAL_TABLET | Freq: Three times a day (TID) | ORAL | 0 refills | Status: DC | PRN

## 2017-05-31 MED ORDER — ALBUTEROL SULFATE HFA 108 (90 BASE) MCG/ACT IN AERS: 2.0000 | INHALATION_SPRAY | Freq: Four times a day (QID) | RESPIRATORY_TRACT | 0 refills | Status: DC | PRN

## 2017-05-31 MED ORDER — PANTOPRAZOLE SODIUM 40 MG PO TBEC
40.0000 mg | DELAYED_RELEASE_TABLET | Freq: Every day | ORAL | 2 refills | Status: DC
Start: 2017-05-31 — End: 2017-11-19

## 2017-05-31 MED ORDER — ESCITALOPRAM OXALATE 20 MG PO TABS: 20.0000 mg | ORAL_TABLET | Freq: Every day | ORAL | 1 refills | Status: DC

## 2017-05-31 NOTE — Telephone Encounter (Signed)
Notified pt and she voices understanding. 

## 2017-05-31 NOTE — Patient Instructions (Addendum)
Stop citalopram, start lexapro (escitalopram).   Contact [facility] to schedule an appointment with a counselor [employer] Bauert [phone]. Go to the ER if severe/worsening abdominal pain or chest pain.  Continue protonix.

## 2017-05-31 NOTE — Progress Notes (Signed)
Subjective:    Patient ID: Veronica Wolf, female    DOB: 02-25-1941, 77 y.o.   MRN: [id number]  HPI  Patient is a 77 year old female who presents today for follow-up.  Anxiety/depression-last visit she noted that her anxiety had worsened and she was sleeping more.  Felt unmotivated. We stopped her citalopram and transitioned her to Lexapro.  She continues as needed Valium. She did not start lexapro.  She continues current dose of citalopram. Not even going to church anymore which she is upset about.    She reports no improvement in her mood or motivation.  Just feels "blah."  "I could stay in my bed all day long."  Reports hopelessness. Denies SI.    Hand pain/gout- uric acid level was increased last visit.  We increased her allopurinol dosing from 100-200 mg daily.Reports improvement in hand pain.  GI concerns- She denies constipation.  Reports 2 good BM's a day.  She reports some substernal pain, notes first thing in the AM. She describes the pain as a constant "annoying pain."    Denies current chest pain.  She does report some epigastric discomfort today.  She also reports + dizziness which is relieved by meclizine prn. Requests refill.   She reports constant HA's.  She has not eaten yet.  HA is frontal.  She denies sinus congestion. She does snore.  She denies daytime somnolence. Has vertigo symptoms which are improved by meclizine prn. She reports that meclizine helps these symptoms and she requests refill.   Review of Systems   See HPI  Past Medical History:  Diagnosis Date  . Allergic rhinitis   . Anxiety   . CKD (chronic kidney disease) 04/04/2017  . COPD (chronic obstructive pulmonary disease) (HCC)   . Depression   . GERD (gastroesophageal reflux disease)   . Hyperlipidemia   . Hypertension   . Peptic ulcer 01/04/2003     Social History   Socioeconomic History  . Marital status: Widowed    Spouse name: Not on file  . Number of children: Not on file  . Years of  education: Not on file  . Highest education level: Not on file  Social Needs  . Financial resource strain: Not on file  . Food insecurity - worry: Not on file  . Food insecurity - inability: Not on file  . Transportation needs - medical: Not on file  . Transportation needs - non-medical: Not on file  Occupational History  . Not on file  Tobacco Use  . Smoking status: Former Games developermoker  . Smokeless tobacco: Never Used  Substance and Sexual Activity  . Alcohol use: No    Comment: has previous hx of ETOH abuse, quit 2014  . Drug use: No  . Sexual activity: Not on file  Other Topics Concern  . 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 ABDOMINAL HYSTERECTOMY      No family history on file.  Allergies  Allergen Reactions  . Ibuprofen Other (See Comments)    Pt has hx of peptic ulcer and diverticulitis.     Current Outpatient Medications on File Prior to Visit  Medication Sig Dispense Refill  . acetaminophen (TYLENOL) 325 MG tablet Take 650 mg by mouth every 6 (six) hours as needed for mild pain or moderate pain.    .Marland Kitchen  allopurinol (ZYLOPRIM) 100 MG tablet Take 2 tablets (200 mg total) by mouth daily. 60 tablet 6  . citalopram (CELEXA) 40 MG tablet     . diazepam (VALIUM) 10 MG tablet Take 1 tablet (10 mg total) by mouth every 12 (twelve) hours as needed. for anxiety 45 tablet 0  . diphenhydrAMINE (BENADRYL) 25 MG tablet Take 25 mg by mouth every 6 (six) hours as needed.    Marland Kitchen lisinopril (PRINIVIL,ZESTRIL) 10 MG tablet     . meclizine (ANTIVERT) 25 MG tablet Take 1 tablet (25 mg total) by mouth 3 (three) times daily as needed for dizziness. 30 tablet 0  . nystatin (MYCOSTATIN/NYSTOP) powder Apply topically 2 (two) times daily. 60 g 1  . pantoprazole (PROTONIX) 40 MG tablet Take 1 tablet (40 mg total) by mouth daily. 30 tablet 2  .  ranitidine (ZANTAC) 150 MG tablet Take 150 mg by mouth daily.    Marland Kitchen albuterol (PROVENTIL HFA;VENTOLIN HFA) 108 (90 Base) MCG/ACT inhaler Inhale 2 puffs into the lungs every 6 (six) hours as needed for wheezing or shortness of breath. (Patient not taking: Reported on 05/31/2017) 1 Inhaler 0  . albuterol (PROVENTIL) (2.5 MG/3ML) 0.083% nebulizer solution Take 3 mLs (2.5 mg total) by nebulization every 6 (six) hours as needed for wheezing or shortness of breath. (Patient not taking: Reported on 05/31/2017) 150 mL 1  . escitalopram (LEXAPRO) 20 MG tablet Take 1 tablet (20 mg total) by mouth daily. (Patient not taking: Reported on 05/31/2017) 30 tablet 1   No current facility-administered medications on file prior to visit.     BP 108/64 (BP Location: Right Arm, Patient Position: Sitting, Cuff Size: Large)   Pulse 77   Temp 97.6 F (36.4 C) (Oral)   Resp 16   Ht 5\' 3"  (1.6 m)   Wt 186 lb (84.4 kg)   SpO2 98%   BMI 32.95 kg/m       Objective:   Physical Exam  Constitutional: She appears well-developed and well-nourished.  HENT:  Head: Normocephalic and atraumatic.  Right Ear: Tympanic membrane and ear canal normal.  Left Ear: Tympanic membrane and ear canal normal.  Mouth/Throat: No oropharyngeal exudate, posterior oropharyngeal edema or posterior oropharyngeal erythema.  Cardiovascular: Normal rate, regular rhythm and normal heart sounds.  No murmur heard. Pulmonary/Chest: Effort normal and breath sounds normal. No respiratory distress. She has no wheezes.  Abdominal: There is tenderness in the epigastric area. There is no rigidity and no guarding.  Psychiatric: She has a normal mood and affect. Her behavior is normal. Judgment and thought content normal.          Assessment & Plan:  Atypical CP- suspect gerd.  Continue proton pump inhibitor.  EKG is personally reviewed. Notes low voltage and TWI in V1. Sinus rhythm.  I will refer to cardiology for further evaluation however.  She is  advised to go to the emergency room if symptoms worsen.  Will obtain abdominal ultrasound to further evaluate her epigastric pain.  Gout-symptoms improved on increased dose of allopurinol.  Continue same.  Vertigo- continues to have intermittent symptoms.  Reports symptoms are improved with use of meclizine.  Headaches-advised patient to use Tylenol as needed.  We will plan further evaluation next visit if symptoms persist.  Depression- advised pt to establish with a counselor. D/c citalopram start lexapro.    A total of 40  minutes were spent face-to-face with the patient during this encounter and over half of that time was spent on counseling  and coordination of care. The patient was counseled on chest pain symptoms/work up, gerd, depression treatment.

## 2017-05-31 NOTE — Telephone Encounter (Signed)
Patient called pec wanting to know what medications she needs to refill that she is currently out of. She did not get a chance to discuss what medications she still needs to take. She currently has no refills on albuterol (patient states she is not taking)--does she need to start taking again? Meclizine 25mg  Nystatin powder 60g Pantoprazole 40mg     Please advise if patient is to dc these medications she is needing a refill on.

## 2017-05-31 NOTE — Addendum Note (Signed)
Addended by: [name]'SULLIVAN, Moselle Rister on: 05/31/2017 03:05 PM   Modules accepted: Orders

## 2017-05-31 NOTE — Telephone Encounter (Signed)
Please let patient know that I have sent refills on meclizine, pantoprazole, and albuterol.  She should restart pantoprazole she is not already taking.  I did see that this was in her medication bag today.  May use meclizine prn. The nystatin powder is only  needed if she has rash.

## 2017-05-31 NOTE — Telephone Encounter (Signed)
Noted and agree. 

## 2017-05-31 NOTE — Telephone Encounter (Signed)
[facility] for [facility]ram on 05/06/17, should she also be taking escitalopram. Faxed response to pharmacy that Citalopram was changed to Escitalopram (Lexapro) in 03/2017 and she should only be taking lexapro.

## 2017-05-31 NOTE — Telephone Encounter (Signed)
Left message for pt to return my call.

## 2017-06-07 ENCOUNTER — Encounter: Payer: Self-pay | Admitting: Cardiology

## 2017-06-07 ENCOUNTER — Ambulatory Visit (INDEPENDENT_AMBULATORY_CARE_PROVIDER_SITE_OTHER): Payer: Medicare HMO | Admitting: Cardiology

## 2017-06-07 ENCOUNTER — Ambulatory Visit (HOSPITAL_BASED_OUTPATIENT_CLINIC_OR_DEPARTMENT_OTHER)
Admission: RE | Admit: 2017-06-07 | Discharge: 2017-06-07 | Disposition: A | Discharge status: Home / Self Care | Payer: Medicare HMO | Source: Ambulatory Visit | Attending: Cardiology | Admitting: Cardiology

## 2017-06-07 VITALS — BP 122/80 | HR 84 | Ht 63.0 in | Wt 191.4 lb

## 2017-06-07 DIAGNOSIS — I209 Angina pectoris, unspecified: Secondary | ICD-10-CM | POA: Insufficient documentation

## 2017-06-07 DIAGNOSIS — I700 Atherosclerosis of aorta: Secondary | ICD-10-CM | POA: Insufficient documentation

## 2017-06-07 DIAGNOSIS — N189 Chronic kidney disease, unspecified: Secondary | ICD-10-CM | POA: Diagnosis not present

## 2017-06-07 DIAGNOSIS — R918 Other nonspecific abnormal finding of lung field: Secondary | ICD-10-CM | POA: Insufficient documentation

## 2017-06-07 DIAGNOSIS — R079 Chest pain, unspecified: Secondary | ICD-10-CM | POA: Diagnosis not present

## 2017-06-07 DIAGNOSIS — J449 Chronic obstructive pulmonary disease, unspecified: Secondary | ICD-10-CM | POA: Diagnosis not present

## 2017-06-07 MED ORDER — ASPIRIN EC 81 MG PO TBEC: 81.0000 mg | DELAYED_RELEASE_TABLET | Freq: Every day | ORAL | 3 refills | Status: DC

## 2017-06-07 MED ORDER — NITROGLYCERIN 0.4 MG SL SUBL: 0.4000 mg | SUBLINGUAL_TABLET | SUBLINGUAL | 6 refills | Status: DC | PRN

## 2017-06-07 NOTE — H&P (View-Only) (Signed)
Cardiology Office Note:    Date:  06/07/2017   ID:  Veronica Wolf, DOB 09/29/1940, MRN [id number]  PCP:  O'Sullivan, Melissa, NP  Cardiologist:  Vestal Crandall R Navil Kole, MD   Referring MD: O'Sullivan, Melissa, NP    ASSESSMENT:    1. Angina pectoris (HCC)   2. Chronic kidney disease, unspecified CKD stage    PLAN:    In order of problems listed above:  1. Primary prevention stressed with the patient.  Importance of compliance with diet and medications stressed and she vocalized understanding. 2. Her blood pressure is stable. 3. Sublingual nitroglycerin prescription was sent, its protocol and 911 protocol explained and the patient vocalized understanding questions were answered to the patient's satisfaction 4. In view of the patient's symptoms, I discussed with the patient options for evaluation. Invasive and noninvasive options were given to the patient. I discussed stress testing and coronary angiography and left heart catheterization at length. Benefits, pros and cons of each approach were discussed at length. Patient had multiple questions which were answered to the patient's satisfaction. Patient opted for invasive evaluation and we will set up for coronary angiography and left heart catheterization. Further recommendations will be made based on the findings with coronary angiography. In the interim if the patient has any significant symptoms in hospital to the nearest emergency room. 5. I advised her to take 81 mg of coated aspirin on a daily basis. 6. She will be seen in follow-up appointment after the coronary angiography.   Medication Adjustments/Labs and Tests Ordered: Current medicines are reviewed at length with the patient today.  Concerns regarding medicines are outlined above.  Orders Placed This Encounter  Procedures  . DG Chest 2 View  . Basic metabolic panel  . Protime-INR   Meds ordered this encounter  Medications  . nitroGLYCERIN (NITROSTAT) 0.4 MG SL tablet    Sig:  Place 1 tablet (0.4 mg total) under the tongue every 5 (five) minutes as needed.    Dispense:  11 tablet    Refill:  6  . aspirin EC 81 MG tablet    Sig: Take 1 tablet (81 mg total) by mouth daily.    Dispense:  90 tablet    Refill:  3     History of Present Illness:    Veronica Wolf is a 77 y.o. female who is being seen today for the evaluation of chest pain at the request of O'Sullivan, Melissa, NP.  Patient is a pleasant 77-year-old female.  She does not have many risk factors for coronary artery disease.  She has renal insufficiency history.  She is a smoker but quit many years ago.  No history of hypertension diabetes mellitus or dyslipidemia.  The patient mentions to me that she is experiencing substernal chest tightness at times going to both the shoulders.  No orthopnea or PND.  This comes along when she is stressed.  This has been concerning to her and affects her quality of life.  No history of syncope.  She leads a sedentary lifestyle.  At the time of my evaluation, the patient is alert awake oriented and in no distress.  Past Medical History:  Diagnosis Date  . Allergic rhinitis   . Anxiety   . CKD (chronic kidney disease) 04/04/2017  . COPD (chronic obstructive pulmonary disease) (HCC)   . Depression   . GERD (gastroesophageal reflux disease)   . Hyperlipidemia   . Hypertension   . Peptic ulcer 01/04/2003    Past Surgical History:    Procedure Laterality Date  . ABDOMINAL HYSTERECTOMY      Current Medications: Current Meds  Medication Sig  . acetaminophen (TYLENOL) 325 MG tablet Take 650 mg by mouth every 6 (six) hours as needed for mild pain or moderate pain.  . allopurinol (ZYLOPRIM) 100 MG tablet Take 2 tablets (200 mg total) by mouth daily.  . diazepam (VALIUM) 10 MG tablet Take 1 tablet (10 mg total) by mouth every 12 (twelve) hours as needed. for anxiety  . escitalopram (LEXAPRO) 20 MG tablet Take 1 tablet (20 mg total) by mouth daily.  . lisinopril  (PRINIVIL,ZESTRIL) 10 MG tablet   . meclizine (ANTIVERT) 25 MG tablet Take 1 tablet (25 mg total) by mouth 3 (three) times daily as needed for dizziness.  . pantoprazole (PROTONIX) 40 MG tablet Take 1 tablet (40 mg total) by mouth daily.     Allergies:   Ibuprofen   Social History   Socioeconomic History  . Marital status: Widowed    Spouse name: None  . Number of children: None  . Years of education: None  . Highest education level: None  Social Needs  . Financial resource strain: None  . Food insecurity - worry: None  . Food insecurity - inability: None  . Transportation needs - medical: None  . Transportation needs - non-medical: None  Occupational History  . None  Tobacco Use  . Smoking status: Former Smoker  . Smokeless tobacco: Never Used  Substance and Sexual Activity  . Alcohol use: No    Comment: has previous hx of ETOH abuse, quit 2014  . Drug use: No  . Sexual activity: None  Other Topics Concern  . 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  ROS:   Please see the history of present illness.    All other systems reviewed and are negative.  EKGs/Labs/Other Studies Reviewed:    The following studies were reviewed today: EKG reveals sinus rhythm and nonspecific ST-T changes I reviewed it.   Recent Labs: 05/31/2017: ALT 6; BUN 15; Creatinine, Ser 1.26; Hemoglobin 12.3; Platelets 161.0; Potassium 4.0; Sodium 141  Recent Lipid Panel No results found for: CHOL, TRIG, HDL, CHOLHDL, VLDL, LDLCALC, LDLDIRECT  Physical Exam:    VS:  BP 122/80 (BP Location: Right Arm, Patient Position: Sitting, Cuff Size: Normal)   Pulse 84   Ht 5' 3" (1.6 m)   Wt 191 lb 6.4 oz (86.8 kg)   SpO2 95%   BMI 33.90 kg/m     Wt Readings from Last 3 Encounters:  06/07/17 191 lb 6.4 oz (86.8 kg)  05/31/17 186 lb (84.4 kg)  04/05/17  181 lb (82.1 kg)     GEN: Patient is in no acute distress HEENT: Normal NECK: No JVD; No carotid bruits LYMPHATICS: No lymphadenopathy CARDIAC: S1 S2 regular, 2/6 systolic murmur at the apex. RESPIRATORY:  Clear to auscultation without rales, wheezing or rhonchi  ABDOMEN: Soft, non-tender, non-distended MUSCULOSKELETAL:  No edema; No deformity  SKIN: Warm and dry NEUROLOGIC:  Alert and oriented x 3 PSYCHIATRIC:  Normal affect    Signed, Teven Mittman R Yoshimi Sarr, MD  06/07/2017 10:59 AM    [facility]   

## 2017-06-07 NOTE — Progress Notes (Signed)
Cardiology Office Note:    Date:  06/07/2017   ID:  Veronica Wolf, DOB 12-Mar-1941, MRN [id number]  PCP:  [name]'Sullivan, Melissa, NP  Cardiologist:  Garwin Brothersajan R Toniyah Dilmore, MD   Referring MD: [name]'Sullivan, Melissa, NP    ASSESSMENT:    1. Angina pectoris (HCC)   2. Chronic kidney disease, unspecified CKD stage    PLAN:    In order of problems listed above:  1. Primary prevention stressed with the patient.  Importance of compliance with diet and medications stressed and she vocalized understanding. 2. Her blood pressure is stable. 3. Sublingual nitroglycerin prescription was sent, its protocol and 911 protocol explained and the patient vocalized understanding questions were answered to the patient's satisfaction 4. In view of the patient's symptoms, I discussed with the patient options for evaluation. Invasive and noninvasive options were given to the patient. I discussed stress testing and coronary angiography and left heart catheterization at length. Benefits, pros and cons of each approach were discussed at length. Patient had multiple questions which were answered to the patient's satisfaction. Patient opted for invasive evaluation and we will set up for coronary angiography and left heart catheterization. Further recommendations will be made based on the findings with coronary angiography. In the interim if the patient has any significant symptoms in hospital to the nearest emergency room. 5. I advised her to take 81 mg of coated aspirin on a daily basis. 6. She will be seen in follow-up appointment after the coronary angiography.   Medication Adjustments/Labs and Tests Ordered: Current medicines are reviewed at length with the patient today.  Concerns regarding medicines are outlined above.  Orders Placed This Encounter  Procedures  . DG Chest 2 View  . Basic metabolic panel  . Protime-INR   Meds ordered this encounter  Medications  . nitroGLYCERIN (NITROSTAT) 0.4 MG SL tablet    Sig:  Place 1 tablet (0.4 mg total) under the tongue every 5 (five) minutes as needed.    Dispense:  11 tablet    Refill:  6  . aspirin EC 81 MG tablet    Sig: Take 1 tablet (81 mg total) by mouth daily.    Dispense:  90 tablet    Refill:  3     History of Present Illness:    Veronica Wolf is a 77 y.o. female who is being seen today for the evaluation of chest pain at the request of [name]'Sullivan, Melissa, NP.  Patient is a pleasant 77 year old female.  She does not have many risk factors for coronary artery disease.  She has renal insufficiency history.  She is a smoker but quit many years ago.  No history of hypertension diabetes mellitus or dyslipidemia.  The patient mentions to me that she is experiencing substernal chest tightness at times going to both the shoulders.  No orthopnea or PND.  This comes along when she is stressed.  This has been concerning to her and affects her quality of life.  No history of syncope.  She leads a sedentary lifestyle.  At the time of my evaluation, the patient is alert awake oriented and in no distress.  Past Medical History:  Diagnosis Date  . Allergic rhinitis   . Anxiety   . CKD (chronic kidney disease) 04/04/2017  . COPD (chronic obstructive pulmonary disease) (HCC)   . Depression   . GERD (gastroesophageal reflux disease)   . Hyperlipidemia   . Hypertension   . Peptic ulcer 01/04/2003    Past Surgical History:  Procedure Laterality Date  . ABDOMINAL HYSTERECTOMY      Current Medications: Current Meds  Medication Sig  . acetaminophen (TYLENOL) 325 MG tablet Take 650 mg by mouth every 6 (six) hours as needed for mild pain or moderate pain.  Marland Kitchen allopurinol (ZYLOPRIM) 100 MG tablet Take 2 tablets (200 mg total) by mouth daily.  . diazepam (VALIUM) 10 MG tablet Take 1 tablet (10 mg total) by mouth every 12 (twelve) hours as needed. for anxiety  . escitalopram (LEXAPRO) 20 MG tablet Take 1 tablet (20 mg total) by mouth daily.  Marland Kitchen lisinopril  (PRINIVIL,ZESTRIL) 10 MG tablet   . meclizine (ANTIVERT) 25 MG tablet Take 1 tablet (25 mg total) by mouth 3 (three) times daily as needed for dizziness.  . pantoprazole (PROTONIX) 40 MG tablet Take 1 tablet (40 mg total) by mouth daily.     Allergies:   Ibuprofen   Social History   Socioeconomic History  . Marital status: Widowed    Spouse name: None  . Number of children: None  . Years of education: None  . Highest education level: None  Social Needs  . Financial resource strain: None  . Food insecurity - worry: None  . Food insecurity - inability: None  . Transportation needs - medical: None  . Transportation needs - non-medical: None  Occupational History  . None  Tobacco Use  . Smoking status: Former Games developer  . Smokeless tobacco: Never Used  Substance and Sexual Activity  . Alcohol use: No    Comment: has previous hx of ETOH abuse, quit 2014  . Drug use: No  . Sexual activity: None  Other Topics Concern  . 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  ROS:   Please see the history of present illness.    All other systems reviewed and are negative.  EKGs/Labs/Other Studies Reviewed:    The following studies were reviewed today: EKG reveals sinus rhythm and nonspecific ST-T changes I reviewed it.   Recent Labs: 05/31/2017: ALT 6; BUN 15; Creatinine, Ser 1.26; Hemoglobin 12.3; Platelets 161.0; Potassium 4.0; Sodium 141  Recent Lipid Panel No results found for: CHOL, TRIG, HDL, CHOLHDL, VLDL, LDLCALC, LDLDIRECT  Physical Exam:    VS:  BP 122/80 (BP Location: Right Arm, Patient Position: Sitting, Cuff Size: Normal)   Pulse 84   Ht 5\' 3"  (1.6 m)   Wt 191 lb 6.4 oz (86.8 kg)   SpO2 95%   BMI 33.90 kg/m     Wt Readings from Last 3 Encounters:  06/07/17 191 lb 6.4 oz (86.8 kg)  05/31/17 186 lb (84.4 kg)  04/05/17  181 lb (82.1 kg)     GEN: Patient is in no acute distress HEENT: Normal NECK: No JVD; No carotid bruits LYMPHATICS: No lymphadenopathy CARDIAC: S1 S2 regular, 2/6 systolic murmur at the apex. RESPIRATORY:  Clear to auscultation without rales, wheezing or rhonchi  ABDOMEN: Soft, non-tender, non-distended MUSCULOSKELETAL:  No edema; No deformity  SKIN: Warm and dry NEUROLOGIC:  Alert and oriented x 3 PSYCHIATRIC:  Normal affect    Signed, Garwin Brothers, MD  06/07/2017 10:59 AM    [facility]

## 2017-06-07 NOTE — Addendum Note (Signed)
Addended by: Craige CottaANDERSON, ASHLEY S on: 06/07/2017 11:23 AM   Modules accepted: [employer]

## 2017-06-07 NOTE — Addendum Note (Signed)
Addended by: Belva CromeEVANKAR, Janith Nielson R on: 06/07/2017 11:29 AM   Modules accepted: Orders, SmartSet

## 2017-06-07 NOTE — Patient Instructions (Addendum)
Medication Instructions:  Your physician has recommended you make the following change in your medication:  START enteric coated 81 mg baby aspirin START Nitroglycerin 0.4 mg sublingual (under your tongue) as needed for chest pain. If experiencing chest pain, stop what you are doing and sit down. Take 1 nitroglycerin and wait 5 minutes. If chest pain continues, take another nitroglycerin and wait 5 minutes. If chest pain does not subside, take 1 more nitroglycerin and dial 911. You make take a total of 3 nitroglycerin in a 15 minute time frame  Labwork: Your physician recommends that you have the following labs drawn: BMP and INR on 03/07  Testing/Procedures: A chest x-ray takes a picture of the organs and structures inside the chest, including the heart, lungs, and blood vessels. This test can show several things, including, whether the heart is enlarges; whether fluid is building up in the lungs; and whether pacemaker / defibrillator leads are still in place.    [facility] [address], [address] [place] [address] Dept: [phone] Loc: [phone]  Veronica ChinaLois P Wolf  06/07/2017  You are scheduled for a Cardiac Catheterization on Monday, March 11 with Dr. Bryan Lemmaavid Harding.  1. [facility]: [address] [place], [place] [address] at 8:00 AM (two hours before your procedure to ensure your preparation). Free valet parking service is available.   Special note: Every effort is made to have your procedure done on time. Please understand that emergencies sometimes delay scheduled procedures.  2. Diet: Do not eat or drink anything after midnight prior to your procedure except sips of water to take medications.  3. Labs: Please have done on 03/07  4. Medication instructions in preparation for your procedure:   On the morning of your procedure,  take your Aspirin and any morning medicines NOT listed above.  You may use sips of water.  5. Plan for one night stay--bring personal belongings. 6. Bring a current list of your medications and current insurance cards. 7. You MUST have a responsible person to drive you home. 8. Someone MUST be with you the first 24 hours after you arrive home or your discharge will be delayed. 9. Please wear clothes that are easy to get on and off and wear slip-on shoes.  Thank you for allowing us to care for you!   -- [facility]   Follow-Up: Your physician recommends that you schedule a follow-up appointment in: 6 weeks  Any Other Special Instructions Will Be Listed Below (If Applicable).     If you need a refill on your cardiac medications before your next appointment, please call your pharmacy.   [facility]  Garey HamAshley A, RN, BSN    Coronary Angiogram With Stent Coronary angiogram with stent placement is a procedure to widen or open a narrow blood vessel of the heart (coronary artery). Arteries may become blocked by cholesterol buildup (plaques) in the lining of the wall. When a coronary artery becomes partially blocked, blood flow to that area decreases. This may lead to chest pain or a heart attack (myocardial infarction). A stent is a small piece of metal that looks like mesh or a spring. Stent placement may be done as treatment for a heart attack or right after a coronary angiogram in which a blocked artery is found. Let your health care provider know about:  Any allergies  you have.  All medicines you are taking, including vitamins, herbs, eye drops, creams, and over-the-counter medicines.  Any problems you or family members have had with anesthetic medicines.  Any blood disorders you have.  Any surgeries you have had.  Any medical conditions you have.  Whether you are pregnant or may be pregnant. What are the risks? Generally, this is a safe  procedure. However, problems may occur, including:  Damage to the heart or its blood vessels.  A return of blockage.  Bleeding, infection, or bruising at the insertion site.  A collection of blood under the skin (hematoma) at the insertion site.  A blood clot in another part of the body.  Kidney injury.  Allergic reaction to the dye or contrast that is used.  Bleeding into the abdomen (retroperitoneal bleeding).  What happens before the procedure? Staying hydrated Follow instructions from your health care provider about hydration, which may include:  Up to 2 hours before the procedure - you may continue to drink clear liquids, such as water, clear fruit juice, black coffee, and plain tea.  Eating and drinking restrictions Follow instructions from your health care provider about eating and drinking, which may include:  8 hours before the procedure - stop eating heavy meals or foods such as meat, fried foods, or fatty foods.  6 hours before the procedure - stop eating light meals or foods, such as toast or cereal.  2 hours before the procedure - stop drinking clear liquids.  Ask your health care provider about:  Changing or stopping your regular medicines. This is especially important if you are taking diabetes medicines or blood thinners.  Taking medicines such as ibuprofen. These medicines can thin your blood. Do not take these medicines before your procedure if your health care provider instructs you not to. Generally, aspirin is recommended before a procedure of passing a small, thin tube (catheter) through a blood vessel and into the heart (cardiac catheterization).  What happens during the procedure?  An IV tube will be inserted into one of your veins.  You will be given one or more of the following: ? A medicine to help you relax (sedative). ? A medicine to numb the area where the catheter will be inserted into an artery (local anesthetic).  To reduce your risk of  infection: ? Your health care team will wash or sanitize their hands. ? Your skin will be washed with soap. ? Hair may be removed from the area where the catheter will be inserted.  Using a guide wire, the catheter will be inserted into an artery. The location may be in your groin, in your wrist, or in the fold of your arm (near your elbow).  A type of X-ray (fluoroscopy) will be used to help guide the catheter to the opening of the arteries in the heart.  A dye will be injected into the catheter, and X-rays will be taken. The dye will help to show where any narrowing or blockages are located in the arteries.  A tiny wire will be guided to the blocked spot, and a balloon will be inflated to make the artery wider.  The stent will be expanded and will crush the plaques into the wall of the vessel. The stent will hold the area open and improve the blood flow. Most stents have a drug coating to reduce the risk of the stent narrowing over time.  The artery may be made wider using a drill, laser, or other tools to remove  plaques.  When the blood flow is better, the catheter will be removed. The lining of the artery will grow over the stent, which stays where it was placed. This procedure may vary among health care providers and hospitals. What happens after the procedure?  If the procedure is done through the leg, you will be kept in bed lying flat for about 6 hours. You will be instructed to not bend and not cross your legs.  The insertion site will be checked frequently.  The pulse in your foot or wrist will be checked frequently.  You may have additional blood tests, X-rays, and a test that records the electrical activity of your heart (electrocardiogram, or ECG). This information is not intended to replace advice given to you by your health care provider. Make sure you discuss any questions you have with your health care provider. Document Released: 09/26/2002 Document Revised: 11/20/2015  Document Reviewed: 10/26/2015 Elsevier Interactive Patient Education  [employer].

## 2017-06-09 ENCOUNTER — Telehealth: Payer: Self-pay | Admitting: *Deleted

## 2017-06-09 NOTE — Telephone Encounter (Signed)
Pt contacted pre[facility]catheterization scheduled at [facility] for: Monday March 11,2019 10:30 AM Verified arrival time and place: Main Entrance A/[facility] Tower 8 AM Nothing to eat or drink after midnight prior to cath. Verified allergies in Epic. Verified no diabetes medications.  AM meds can be  taken pre-cath with sip of water including: ASA 81 mg    Confirmed patient has responsible person to drive home post procedure and observe patient for 24 hours: yes

## 2017-06-09 NOTE — Telephone Encounter (Signed)
LMTCB for pt to discuss instructions for cath scheduled Monday March 11,2019.

## 2017-06-10 DIAGNOSIS — I209 Angina pectoris, unspecified: Secondary | ICD-10-CM | POA: Diagnosis not present

## 2017-06-11 ENCOUNTER — Other Ambulatory Visit: Payer: Self-pay | Admitting: Family

## 2017-06-11 LAB — BASIC METABOLIC PANEL
BUN / CREAT RATIO: 10 — AB (ref 12–28)
BUN: 14 mg/dL (ref 8–27)
CHLORIDE: 102 mmol/L (ref 96–106)
CO2: 24 mmol/L (ref 20–29)
Calcium: 8.6 mg/dL — ABNORMAL LOW (ref 8.7–10.3)
Creatinine, Ser: 1.34 mg/dL — ABNORMAL HIGH (ref 0.57–1.00)
GFR calc Af Amer: 44 mL/min/{1.73_m2} — ABNORMAL LOW (ref >59)
GFR calc non Af Amer: 38 mL/min/{1.73_m2} — ABNORMAL LOW (ref >59)
GLUCOSE: 111 mg/dL — AB (ref 65–99)
Potassium: 3.8 mmol/L (ref 3.5–5.2)
Sodium: 142 mmol/L (ref 134–144)

## 2017-06-11 LAB — PROTIME-INR
INR: 1.1 (ref 0.8–1.2)
PROTHROMBIN TIME: 11 s (ref 9.1–12.0)

## 2017-06-13 ENCOUNTER — Ambulatory Visit (HOSPITAL_COMMUNITY)
Admission: RE | Disposition: A | Discharge status: Home / Self Care | Payer: Self-pay | Source: Ambulatory Visit | Attending: Cardiology

## 2017-06-13 ENCOUNTER — Ambulatory Visit (HOSPITAL_COMMUNITY)
Admission: RE | Admit: 2017-06-13 | Discharge: 2017-06-13 | Disposition: A | Discharge status: Home / Self Care | Payer: Medicare HMO | Source: Ambulatory Visit | Attending: Cardiology | Admitting: Cardiology

## 2017-06-13 DIAGNOSIS — J449 Chronic obstructive pulmonary disease, unspecified: Secondary | ICD-10-CM | POA: Insufficient documentation

## 2017-06-13 DIAGNOSIS — Z8711 Personal history of peptic ulcer disease: Secondary | ICD-10-CM | POA: Insufficient documentation

## 2017-06-13 DIAGNOSIS — K219 Gastro-esophageal reflux disease without esophagitis: Secondary | ICD-10-CM | POA: Diagnosis not present

## 2017-06-13 DIAGNOSIS — Z87891 Personal history of nicotine dependence: Secondary | ICD-10-CM | POA: Diagnosis not present

## 2017-06-13 DIAGNOSIS — R0609 Other forms of dyspnea: Secondary | ICD-10-CM

## 2017-06-13 DIAGNOSIS — N183 Chronic kidney disease, stage 3 (moderate): Secondary | ICD-10-CM

## 2017-06-13 DIAGNOSIS — I209 Angina pectoris, unspecified: Secondary | ICD-10-CM

## 2017-06-13 DIAGNOSIS — I129 Hypertensive chronic kidney disease with stage 1 through stage 4 chronic kidney disease, or unspecified chronic kidney disease: Secondary | ICD-10-CM | POA: Insufficient documentation

## 2017-06-13 DIAGNOSIS — E785 Hyperlipidemia, unspecified: Secondary | ICD-10-CM | POA: Diagnosis not present

## 2017-06-13 DIAGNOSIS — R0789 Other chest pain: Secondary | ICD-10-CM | POA: Diagnosis not present

## 2017-06-13 DIAGNOSIS — I708 Atherosclerosis of other arteries: Secondary | ICD-10-CM | POA: Insufficient documentation

## 2017-06-13 DIAGNOSIS — I200 Unstable angina: Secondary | ICD-10-CM | POA: Diagnosis not present

## 2017-06-13 DIAGNOSIS — F329 Major depressive disorder, single episode, unspecified: Secondary | ICD-10-CM | POA: Insufficient documentation

## 2017-06-13 DIAGNOSIS — F419 Anxiety disorder, unspecified: Secondary | ICD-10-CM | POA: Insufficient documentation

## 2017-06-13 DIAGNOSIS — N189 Chronic kidney disease, unspecified: Secondary | ICD-10-CM | POA: Diagnosis not present

## 2017-06-13 HISTORY — PX: LEFT HEART CATH AND CORONARY ANGIOGRAPHY: CATH118249

## 2017-06-13 SURGERY — LEFT HEART CATH AND CORONARY ANGIOGRAPHY
Anesthesia: LOCAL

## 2017-06-13 MED ORDER — HEPARIN (PORCINE) IN NACL 2-0.9 UNIT/ML-% IJ SOLN
INTRAMUSCULAR | Status: AC | PRN
  Administered 2017-06-13: 500 mL

## 2017-06-13 MED ORDER — IOPAMIDOL (ISOVUE-370) INJECTION 76%
INTRAVENOUS | Status: DC | PRN
  Administered 2017-06-13: 50 mL via INTRAVENOUS

## 2017-06-13 MED ORDER — ACETAMINOPHEN 325 MG PO TABS
650.0000 mg | ORAL_TABLET | ORAL | Status: DC | PRN
Start: 2017-06-13 — End: 2017-06-13

## 2017-06-13 MED ORDER — IOPAMIDOL (ISOVUE-370) INJECTION 76%
INTRAVENOUS | Status: AC
  Filled 2017-06-13: qty 100

## 2017-06-13 MED ORDER — SODIUM CHLORIDE 0.9% FLUSH: 3.0000 mL | INTRAVENOUS | Status: DC | PRN

## 2017-06-13 MED ORDER — MIDAZOLAM HCL 2 MG/2ML IJ SOLN
INTRAMUSCULAR | Status: AC
  Filled 2017-06-13: qty 2

## 2017-06-13 MED ORDER — ASPIRIN 81 MG PO CHEW: 81.0000 mg | CHEWABLE_TABLET | ORAL | Status: DC

## 2017-06-13 MED ORDER — LIDOCAINE HCL (PF) 1 % IJ SOLN
INTRAMUSCULAR | Status: DC | PRN
  Administered 2017-06-13: 3 mL
  Administered 2017-06-13: 14 mL

## 2017-06-13 MED ORDER — FENTANYL CITRATE (PF) 100 MCG/2ML IJ SOLN
INTRAMUSCULAR | Status: DC | PRN
  Administered 2017-06-13: 25 ug via INTRAVENOUS

## 2017-06-13 MED ORDER — MIDAZOLAM HCL 2 MG/2ML IJ SOLN
INTRAMUSCULAR | Status: DC | PRN
  Administered 2017-06-13: 1 mg via INTRAVENOUS

## 2017-06-13 MED ORDER — FENTANYL CITRATE (PF) 100 MCG/2ML IJ SOLN
INTRAMUSCULAR | Status: AC
  Filled 2017-06-13: qty 2

## 2017-06-13 MED ORDER — SODIUM CHLORIDE 0.9% FLUSH: 3.0000 mL | Freq: Two times a day (BID) | INTRAVENOUS | Status: DC

## 2017-06-13 MED ORDER — SODIUM CHLORIDE 0.9 % WEIGHT BASED INFUSION: 1.0000 mL/kg/h | INTRAVENOUS | Status: DC

## 2017-06-13 MED ORDER — VERAPAMIL HCL 2.5 MG/ML IV SOLN
INTRAVENOUS | Status: AC
  Filled 2017-06-13: qty 2

## 2017-06-13 MED ORDER — SODIUM CHLORIDE 0.9 % IV SOLN: 250.0000 mL | INTRAVENOUS | Status: DC | PRN

## 2017-06-13 MED ORDER — HEPARIN (PORCINE) IN NACL 2-0.9 UNIT/ML-% IJ SOLN
INTRAMUSCULAR | Status: AC
  Filled 2017-06-13: qty 1000

## 2017-06-13 MED ORDER — SODIUM CHLORIDE 0.9 % IV SOLN: INTRAVENOUS | Status: AC

## 2017-06-13 MED ORDER — LIDOCAINE HCL (PF) 1 % IJ SOLN
INTRAMUSCULAR | Status: AC
  Filled 2017-06-13: qty 30

## 2017-06-13 MED ORDER — HEPARIN SODIUM (PORCINE) 1000 UNIT/ML IJ SOLN
INTRAMUSCULAR | Status: AC
  Filled 2017-06-13: qty 1

## 2017-06-13 MED ORDER — ONDANSETRON HCL 4 MG/2ML IJ SOLN: 4.0000 mg | Freq: Four times a day (QID) | INTRAMUSCULAR | Status: DC | PRN

## 2017-06-13 MED ORDER — SODIUM CHLORIDE 0.9 % WEIGHT BASED INFUSION
3.0000 mL/kg/h | INTRAVENOUS | Status: DC
  Administered 2017-06-13: 3 mL/kg/h via INTRAVENOUS

## 2017-06-13 MED ORDER — VERAPAMIL HCL 2.5 MG/ML IV SOLN
INTRAVENOUS | Status: DC | PRN
  Administered 2017-06-13: 10 mL via INTRA_ARTERIAL

## 2017-06-13 SURGICAL SUPPLY — 14 items
BAND ZEPHYR COMPRESS 30 LONG (HEMOSTASIS) ×2 IMPLANT
CATH INFINITI 5FR MULTPACK ANG (CATHETERS) ×2 IMPLANT
CATH OPTITORQUE TIG 4.0 5F (CATHETERS) ×2 IMPLANT
COVER PRB 48X5XTLSCP FOLD TPE (BAG) ×1 IMPLANT
COVER PROBE 5X48 (BAG) ×1
GLIDESHEATH SLEND A-KIT 6F 22G (SHEATH) ×2 IMPLANT
GUIDEWIRE INQWIRE 1.5J.035X260 (WIRE) ×1 IMPLANT
INQWIRE 1.5J .035X260CM (WIRE) ×2
KIT HEART LEFT (KITS) ×2 IMPLANT
PACK CARDIAC CATHETERIZATION (CUSTOM PROCEDURE TRAY) ×2 IMPLANT
SHEATH AVANTI 11CM 5FR (MISCELLANEOUS) ×2 IMPLANT
TRANSDUCER W/STOPCOCK (MISCELLANEOUS) ×2 IMPLANT
TUBING CIL FLEX 10 FLL-RA (TUBING) ×2 IMPLANT
WIRE HI TORQ VERSACORE-J 145CM (WIRE) ×2 IMPLANT

## 2017-06-13 NOTE — Telephone Encounter (Signed)
Last meclizine RX: 05/31/17, #30 Last Diazepam RX:  05/19/17, #45  Last OV: 05/31/17 Next OV:  Due 06/14/17 but not scheduled UDS:  03/22/17, moderate. CSC: 03/22/17 CSR: No discrepancies identified

## 2017-06-13 NOTE — Discharge Instructions (Signed)
Femoral Site Care °Refer to this sheet in the next few weeks. These instructions provide you with information about caring for yourself after your procedure. Your health care provider may also give you more specific instructions. Your treatment has been planned according to current medical practices, but problems sometimes occur. Call your health care provider if you have any problems or questions after your procedure. °What can I expect after the procedure? °After your procedure, it is typical to have the following: °· Bruising at the site that usually fades within 1-2 weeks. °· Blood collecting in the tissue (hematoma) that may be painful to the touch. It should usually decrease in size and tenderness within 1-2 weeks. ° °Follow these instructions at home: °· Take medicines only as directed by your health care provider. °· You may shower 24-48 hours after the procedure or as directed by your health care provider. Remove the bandage (dressing) and gently wash the site with plain soap and water. Pat the area dry with a clean towel. Do not rub the site, because this may cause bleeding. °· Do not take baths, swim, or use a hot tub until your health care provider approves. °· Check your insertion site every day for redness, swelling, or drainage. °· Do not apply powder or lotion to the site. °· Limit use of stairs to twice a day for the first 2-3 days or as directed by your health care provider. °· Do not squat for the first 2-3 days or as directed by your health care provider. °· Do not lift over 10 lb (4.5 kg) for 5 days after your procedure or as directed by your health care provider. °· Ask your health care provider when it is okay to: °? Return to work or school. °? Resume usual physical activities or sports. °? Resume sexual activity. °· Do not drive home if you are discharged the same day as the procedure. Have someone else drive you. °· You may drive 24 hours after the procedure unless otherwise instructed by  your health care provider. °· Do not operate machinery or power tools for 24 hours after the procedure or as directed by your health care provider. °· If your procedure was done as an outpatient procedure, which means that you went home the same day as your procedure, a responsible adult should be with you for the first 24 hours after you arrive home. °· Keep all follow-up visits as directed by your health care provider. This is important. °Contact a health care provider if: °· You have a fever. °· You have chills. °· You have increased bleeding from the site. Hold pressure on the site. °Get help right away if: °· You have unusual pain at the site. °· You have redness, warmth, or swelling at the site. °· You have drainage (other than a small amount of blood on the dressing) from the site. °· The site is bleeding, and the bleeding does not stop after 30 minutes of holding steady pressure on the site. °· Your leg or foot becomes pale, cool, tingly, or numb. °This information is not intended to replace advice given to you by your health care provider. Make sure you discuss any questions you have with your health care provider. °Document Released: 11/23/2013 Document Revised: 08/28/2015 Document Reviewed: 10/09/2013 °Elsevier Interactive Patient Education © 2018 Elsevier Inc. ° °Radial Site Care °Refer to this sheet in the next few weeks. These instructions provide you with information about caring for yourself after your procedure. Your health   care provider may also give you more specific instructions. Your treatment has been planned according to current medical practices, but problems sometimes occur. Call your health care provider if you have any problems or questions after your procedure. °What can I expect after the procedure? °After your procedure, it is typical to have the following: °· Bruising at the radial site that usually fades within 1-2 weeks. °· Blood collecting in the tissue (hematoma) that may be  painful to the touch. It should usually decrease in size and tenderness within 1-2 weeks. ° °Follow these instructions at home: °· Take medicines only as directed by your health care provider. °· You may shower 24-48 hours after the procedure or as directed by your health care provider. Remove the bandage (dressing) and gently wash the site with plain soap and water. Pat the area dry with a clean towel. Do not rub the site, because this may cause bleeding. °· Do not take baths, swim, or use a hot tub until your health care provider approves. °· Check your insertion site every day for redness, swelling, or drainage. °· Do not apply powder or lotion to the site. °· Do not flex or bend the affected arm for 24 hours or as directed by your health care provider. °· Do not push or pull heavy objects with the affected arm for 24 hours or as directed by your health care provider. °· Do not lift over 10 lb (4.5 kg) for 5 days after your procedure or as directed by your health care provider. °· Ask your health care provider when it is okay to: °? Return to work or school. °? Resume usual physical activities or sports. °? Resume sexual activity. °· Do not drive home if you are discharged the same day as the procedure. Have someone else drive you. °· You may drive 24 hours after the procedure unless otherwise instructed by your health care provider. °· Do not operate machinery or power tools for 24 hours after the procedure. °· If your procedure was done as an outpatient procedure, which means that you went home the same day as your procedure, a responsible adult should be with you for the first 24 hours after you arrive home. °· Keep all follow-up visits as directed by your health care provider. This is important. °Contact a health care provider if: °· You have a fever. °· You have chills. °· You have increased bleeding from the radial site. Hold pressure on the site. °Get help right away if: °· You have unusual pain at the  radial site. °· You have redness, warmth, or swelling at the radial site. °· You have drainage (other than a small amount of blood on the dressing) from the radial site. °· The radial site is bleeding, and the bleeding does not stop after 30 minutes of holding steady pressure on the site. °· Your arm or hand becomes pale, cool, tingly, or numb. °This information is not intended to replace advice given to you by your health care provider. Make sure you discuss any questions you have with your health care provider. °Document Released: 04/24/2010 Document Revised: 08/28/2015 Document Reviewed: 10/08/2013 °Elsevier Interactive Patient Education © 2018 Elsevier Inc. ° ° °

## 2017-06-13 NOTE — Progress Notes (Addendum)
Site area: Right groin a 5 french arterial sheath was removed  Site Prior to Removal:  Level 0  Pressure Applied For 15 MINUTES    Bedrest Beginning at 1345p  Manual:   Yes.    Patient Status During Pull:  stable  Post Pull Groin Site:  Level 0  Post Pull Instructions Given:  Yes.    Post Pull Pulses Present:  Yes.    Dressing Applied:  Yes.    Comments:  VS remain stable

## 2017-06-13 NOTE — Interval H&P Note (Signed)
History and Physical Interval Note:  06/13/2017 12:28 PM  Veronica Wolf  has presented today for surgery, with the diagnosis of progressive angina  The various methods of treatment have been discussed with the patient and family. After consideration of risks, benefits and other options for treatment, the patient has consented to  Procedure(s): LEFT HEART CATH AND CORONARY ANGIOGRAPHY (N/A) with possible PERCUTANEOUS CORONARY INTERVENTION as a surgical intervention .  The patient's history has been reviewed, patient examined, no change in status, stable for surgery.  I have reviewed the patient's chart and labs.  Questions were answered to the patient's satisfaction.    Cath Lab Visit (complete for each Cath Lab visit)  Clinical Evaluation Leading to the Procedure:   ACS: No.  Non-ACS:    Anginal Classification: CCS III  Anti-ischemic medical therapy: No Therapy  Non-Invasive Test Results: No non-invasive testing performed  Prior CABG: No previous CABG  Bryan Lemmaavid Aydrian Halpin

## 2017-06-14 ENCOUNTER — Encounter (HOSPITAL_COMMUNITY): Payer: Self-pay | Admitting: Cardiology

## 2017-06-14 MED FILL — Heparin Sodium (Porcine) Inj 1000 Unit/ML: INTRAMUSCULAR | Qty: 10 | Status: AC

## 2017-07-03 ENCOUNTER — Other Ambulatory Visit: Payer: Self-pay | Admitting: Family

## 2017-07-06 NOTE — Research (Signed)
LATE ENTRY   CADFEM Informed Consent    Subject Name: Veronica Wolf   Subject met inclusion and exclusion criteria.  The informed consent form, study requirements and expectations were reviewed with the subject and questions and concerns were addressed prior to the signing of the consent form.  The subject verbalized understanding of the trail requirements.  The subject agreed to participate in the CADFEM trial and signed the informed consent.  The informed consent was obtained prior to performance of any protocol-specific procedures for the subject.  A copy of the signed informed consent was given to the subject and a copy was placed in the subject's medical record.    [place] Chalmers, Research Assistant 06/13/2017, 9:17 a.m.

## 2017-07-19 ENCOUNTER — Ambulatory Visit: Payer: Medicare HMO | Admitting: Cardiology

## 2017-07-19 ENCOUNTER — Other Ambulatory Visit: Payer: Self-pay | Admitting: Family

## 2017-07-22 ENCOUNTER — Other Ambulatory Visit: Payer: Self-pay | Admitting: Family

## 2017-07-30 ENCOUNTER — Telehealth: Payer: Self-pay | Admitting: Family

## 2017-08-01 NOTE — Telephone Encounter (Signed)
I sent a partial refill. Needs OV prior to additional refills please.

## 2017-08-01 NOTE — Telephone Encounter (Signed)
Last diazepam RX: 06/13/17, #45 Last OV: 05/31/17 Next OV: was due at first of march and is past due. UDS: 03/22/17 CSC: 03/22/17 CSR: No discrepancies identified

## 2017-08-02 NOTE — Telephone Encounter (Signed)
Patient returning Bridgett's call. Informed her that Bridgett was calling to inform her that she needs to schedule a medication refill appointment. States that she would have to call back to schedule that appointment. FYI

## 2017-08-02 NOTE — Telephone Encounter (Signed)
Left voicemail for pt to call us and schedule appt for medication refill.

## 2017-08-02 NOTE — Telephone Encounter (Signed)
Bridgett-- please call pt to schedule follow up with Melissa soon. She was due for OV in March. Thanks!

## 2017-09-02 ENCOUNTER — Telehealth: Payer: Self-pay

## 2017-09-02 ENCOUNTER — Other Ambulatory Visit: Payer: Self-pay

## 2017-09-02 MED ORDER — LISINOPRIL 10 MG PO TABS: 10.0000 mg | ORAL_TABLET | Freq: Every day | ORAL | 0 refills | Status: DC

## 2017-09-02 NOTE — Telephone Encounter (Signed)
Patient was given a 30 days supply of Lisinopril 10 mg. Please contact her for follow up appointment with [facility] for hypertension. Thanks.

## 2017-09-05 NOTE — Telephone Encounter (Signed)
Called pt and LVM informing that her refill has been sent, but she would need a follow up visit before any further meds could be refilled. Advised pt to call and schedule a follow up visit at her earliest convenience.

## 2017-09-07 ENCOUNTER — Encounter (HOSPITAL_BASED_OUTPATIENT_CLINIC_OR_DEPARTMENT_OTHER): Payer: Self-pay

## 2017-09-07 ENCOUNTER — Ambulatory Visit: Payer: Self-pay | Admitting: *Deleted

## 2017-09-07 ENCOUNTER — Emergency Department (HOSPITAL_BASED_OUTPATIENT_CLINIC_OR_DEPARTMENT_OTHER): Payer: Medicare HMO

## 2017-09-07 ENCOUNTER — Other Ambulatory Visit: Payer: Self-pay

## 2017-09-07 ENCOUNTER — Emergency Department (HOSPITAL_BASED_OUTPATIENT_CLINIC_OR_DEPARTMENT_OTHER)
Admission: EM | Admit: 2017-09-07 | Discharge: 2017-09-07 | Disposition: A | Discharge status: Home / Self Care | Payer: Medicare HMO | Attending: Emergency Medicine | Admitting: Emergency Medicine

## 2017-09-07 DIAGNOSIS — R110 Nausea: Secondary | ICD-10-CM | POA: Diagnosis not present

## 2017-09-07 DIAGNOSIS — F419 Anxiety disorder, unspecified: Secondary | ICD-10-CM | POA: Diagnosis not present

## 2017-09-07 DIAGNOSIS — R0602 Shortness of breath: Secondary | ICD-10-CM | POA: Diagnosis not present

## 2017-09-07 DIAGNOSIS — F329 Major depressive disorder, single episode, unspecified: Secondary | ICD-10-CM | POA: Diagnosis not present

## 2017-09-07 DIAGNOSIS — J441 Chronic obstructive pulmonary disease with (acute) exacerbation: Secondary | ICD-10-CM | POA: Insufficient documentation

## 2017-09-07 DIAGNOSIS — Z87891 Personal history of nicotine dependence: Secondary | ICD-10-CM | POA: Insufficient documentation

## 2017-09-07 DIAGNOSIS — Z79899 Other long term (current) drug therapy: Secondary | ICD-10-CM | POA: Diagnosis not present

## 2017-09-07 DIAGNOSIS — N189 Chronic kidney disease, unspecified: Secondary | ICD-10-CM | POA: Diagnosis not present

## 2017-09-07 DIAGNOSIS — R079 Chest pain, unspecified: Secondary | ICD-10-CM | POA: Diagnosis not present

## 2017-09-07 DIAGNOSIS — I129 Hypertensive chronic kidney disease with stage 1 through stage 4 chronic kidney disease, or unspecified chronic kidney disease: Secondary | ICD-10-CM | POA: Diagnosis not present

## 2017-09-07 DIAGNOSIS — Z7982 Long term (current) use of aspirin: Secondary | ICD-10-CM | POA: Diagnosis not present

## 2017-09-07 HISTORY — DX: Dizziness and giddiness: R42

## 2017-09-07 LAB — BASIC METABOLIC PANEL
ANION GAP: 6 (ref 5–15)
BUN: 13 mg/dL (ref 6–20)
CO2: 26 mmol/L (ref 22–32)
Calcium: 8.6 mg/dL — ABNORMAL LOW (ref 8.9–10.3)
Chloride: 109 mmol/L (ref 101–111)
Creatinine, Ser: 1.48 mg/dL — ABNORMAL HIGH (ref 0.44–1.00)
GFR calc non Af Amer: 33 mL/min — ABNORMAL LOW (ref >60)
GFR, EST AFRICAN AMERICAN: 38 mL/min — AB (ref >60)
GLUCOSE: 109 mg/dL — AB (ref 65–99)
Potassium: 4 mmol/L (ref 3.5–5.1)
SODIUM: 141 mmol/L (ref 135–145)

## 2017-09-07 LAB — CBC
HEMATOCRIT: 41 % (ref 36.0–46.0)
Hemoglobin: 13.2 g/dL (ref 12.0–15.0)
MCH: 28.6 pg (ref 26.0–34.0)
MCHC: 32.2 g/dL (ref 30.0–36.0)
MCV: 88.9 fL (ref 78.0–100.0)
Platelets: 157 10*3/uL (ref 150–400)
RBC: 4.61 MIL/uL (ref 3.87–5.11)
RDW: 15.2 % (ref 11.5–15.5)
WBC: 7 10*3/uL (ref 4.0–10.5)

## 2017-09-07 LAB — TROPONIN I: Troponin I: <0.03 ng/mL (ref <0.03)

## 2017-09-07 MED ORDER — PREDNISONE 20 MG PO TABS: 40.0000 mg | ORAL_TABLET | Freq: Every day | ORAL | 0 refills | Status: DC

## 2017-09-07 MED ORDER — IPRATROPIUM BROMIDE 0.02 % IN SOLN
0.5000 mg | Freq: Once | RESPIRATORY_TRACT | Status: AC
  Administered 2017-09-07: 0.5 mg via RESPIRATORY_TRACT
  Filled 2017-09-07: qty 2.5

## 2017-09-07 MED ORDER — ALBUTEROL SULFATE HFA 108 (90 BASE) MCG/ACT IN AERS
2.0000 | INHALATION_SPRAY | RESPIRATORY_TRACT | Status: DC | PRN
  Administered 2017-09-07: 2 via RESPIRATORY_TRACT
  Filled 2017-09-07: qty 6.7

## 2017-09-07 MED ORDER — ALBUTEROL SULFATE (2.5 MG/3ML) 0.083% IN NEBU
5.0000 mg | INHALATION_SOLUTION | Freq: Once | RESPIRATORY_TRACT | Status: AC
  Administered 2017-09-07: 5 mg via RESPIRATORY_TRACT
  Filled 2017-09-07: qty 6

## 2017-09-07 MED ORDER — ALBUTEROL SULFATE (2.5 MG/3ML) 0.083% IN NEBU
5.0000 mg | INHALATION_SOLUTION | Freq: Once | RESPIRATORY_TRACT | Status: AC
  Administered 2017-09-07: 5 mg via RESPIRATORY_TRACT
  Filled 2017-09-07 (×2): qty 6

## 2017-09-07 MED ORDER — PREDNISONE 50 MG PO TABS
60.0000 mg | ORAL_TABLET | Freq: Once | ORAL | Status: AC
  Administered 2017-09-07: 60 mg via ORAL
  Filled 2017-09-07: qty 1

## 2017-09-07 NOTE — Discharge Instructions (Signed)
Return if the pain becomes worse, you become more short of breath and the wheezing will not go away or you develop a fever or vomiting

## 2017-09-07 NOTE — ED Provider Notes (Signed)
[facility] EMERGENCY DEPARTMENT Provider Note   CSN: [id number] Arrival date & time: 09/07/17  1756     History   Chief Complaint Chief Complaint  Patient presents with  . Chest Pain    HPI Veronica Wolf is a 77 y.o. female.  The history is provided by the patient.  Chest Pain   This is a new problem. The current episode started 6 to 12 hours ago. The problem occurs constantly. The problem has been gradually worsening. Associated with: started spontaneously after she woke up this morning. The pain is present in the substernal region and lateral region. The pain is at a severity of 5/10. The pain is moderate. The quality of the pain is described as dull and pressure-like. The pain does not radiate. Duration of episode(s) is 10 hours. Exacerbated by: nothing seems to cause it or make it worse. Associated symptoms include cough, nausea and shortness of breath. Pertinent negatives include no abdominal pain, no exertional chest pressure, no fever, no irregular heartbeat, no leg pain, no lower extremity edema, no sputum production, no vomiting and no weakness. Associated symptoms comments: Congestion and cough over the last several days with wheezing.  Not using inhalers because she doesn't have any.  Did not seem to be affected by eating.. She has tried antacids for the symptoms. The treatment provided no relief. Risk factors include being elderly.  Her past medical history is significant for COPD, hyperlipidemia and hypertension.  Pertinent negatives for past medical history include no diabetes and no stimulant use. Past medical history comments: GERD  Procedure history is positive for cardiac catheterization. Past workup comments: cath in 06/2017 with 20% lesion in RCA but otherwise no further disease.    Past Medical History:  Diagnosis Date  . Allergic rhinitis   . Anxiety   . CKD (chronic kidney disease) 04/04/2017  . COPD (chronic obstructive pulmonary disease) (HCC)   .  Depression   . GERD (gastroesophageal reflux disease)   . Hyperlipidemia   . Hypertension   . Peptic ulcer 01/04/2003  . Vertigo     Patient Active Problem List   Diagnosis Date Noted  . Angina pectoris (HCC) 06/07/2017  . CKD (chronic kidney disease) 04/04/2017  . Allergic rhinitis   . Gout 02/28/2017  . Depression with anxiety 02/28/2017  . Epigastric pain 12/22/2016  . COPD with chronic bronchitis (HCC) 12/22/2016  . GERD (gastroesophageal reflux disease) 12/22/2016  . DOE (dyspnea on exertion) 12/22/2016    Past Surgical History:  Procedure Laterality Date  . ABDOMINAL HYSTERECTOMY    . LEFT HEART CATH AND CORONARY ANGIOGRAPHY N/A 06/13/2017   Procedure: LEFT HEART CATH AND CORONARY ANGIOGRAPHY;  Surgeon: Marykay Lex, MD;  Location: [facility] INVASIVE CV LAB;  Service: Cardiovascular;  Laterality: N/A;     OB History   None      Home Medications    Prior to Admission medications   Medication Sig Start Date End Date Taking? Authorizing Provider  acetaminophen (TYLENOL) 500 MG tablet Take 500-1,000 mg by mouth 2 (two) times daily as needed for headache.    [provider]  albuterol (PROVENTIL) (2.5 MG/3ML) 0.083% nebulizer solution Take 3 mLs (2.5 mg total) by nebulization every 6 (six) hours as needed for wheezing or shortness of breath. Patient not taking: Reported on 05/31/2017 03/03/17   [name], NP  allopurinol (ZYLOPRIM) 100 MG tablet Take 2 tablets (200 mg total) by mouth daily. 03/23/17   [name], NP  aspirin EC  81 MG tablet Take 1 tablet (81 mg total) by mouth daily. 06/07/17   Revankar, Aundra Dubin, MD  bismuth subsalicylate (PEPTO BISMOL) 262 MG/15ML suspension Take 30 mLs by mouth every 6 (six) hours as needed for indigestion or diarrhea or loose stools.    [provider]  diazepam (VALIUM) 10 MG tablet TAKE 1 TABLET BY MOUTH EVERY 12 HOURS AS NEEDED FOR ANXIETY 08/01/17   [name], NP  dicyclomine (BENTYL) 10 MG  capsule Take 10 mg by mouth 3 (three) times daily.    [provider]  escitalopram (LEXAPRO) 20 MG tablet Take 1 tablet (20 mg total) by mouth daily. 05/31/17   [name], NP  esomeprazole (NEXIUM) 20 MG capsule Take 20 mg by mouth daily as needed (GERD).    [provider]  lisinopril (PRINIVIL,ZESTRIL) 10 MG tablet Take 1 tablet (10 mg total) by mouth daily. 09/02/17 10/02/17  [name], NP  meclizine (ANTIVERT) 25 MG tablet TAKE 1 TABLET BY MOUTH THREE TIMES DAILY AS NEEDED FOR  DIZZINESS 07/27/17   [name], NP  nitroGLYCERIN (NITROSTAT) 0.4 MG SL tablet Place 1 tablet (0.4 mg total) under the tongue every 5 (five) minutes as needed. Patient taking differently: Place 0.4 mg under the tongue every 5 (five) minutes as needed for chest pain.  06/07/17 09/05/17  Revankar, Aundra Dubin, MD  nystatin (NYSTATIN) powder Apply 1 g topically 3 (three) times daily as needed (rash).    [provider]  pantoprazole (PROTONIX) 40 MG tablet Take 1 tablet (40 mg total) by mouth daily. 05/31/17   [name], NP    Family History No family history on file.  Social History Social History   Tobacco Use  . Smoking status: Former Games developer  . Smokeless tobacco: Never Used  Substance Use Topics  . Alcohol use: No    Comment: has previous hx of ETOH abuse, quit 2014  . Drug use: No     Allergies   Ibuprofen   Review of Systems Review of Systems  Constitutional: Negative for fever.  Respiratory: Positive for cough and shortness of breath. Negative for sputum production.   Cardiovascular: Positive for chest pain.  Gastrointestinal: Positive for nausea. Negative for abdominal pain and vomiting.  Neurological: Negative for weakness.  All other systems reviewed and are negative.    Physical Exam Updated Vital Signs BP (!) 157/77   Pulse 66   Temp 98.7 F (37.1 C) (Oral)   Resp 11   Ht 5\' 3"  (1.6 m)   Wt 86.2 kg (190 lb)   SpO2 99%   BMI  33.66 kg/m   Physical Exam  Constitutional: She is oriented to person, place, and time. She appears well-developed and well-nourished. No distress.  HENT:  Head: Normocephalic and atraumatic.  Right Ear: Tympanic membrane normal.  Left Ear: Tympanic membrane normal.  Nose: Mucosal edema present.  Mouth/Throat: Oropharynx is clear and moist and mucous membranes are normal.  Eyes: Pupils are equal, round, and reactive to light. Conjunctivae and EOM are normal.  Neck: Normal range of motion. Neck supple.  Cardiovascular: Normal rate, regular rhythm and intact distal pulses.  No murmur heard. Pulmonary/Chest: Effort normal. No respiratory distress. She has wheezes. She has no rales. She exhibits no bony tenderness.  Abdominal: Soft. She exhibits no distension. There is no tenderness. There is no rebound and no guarding.  Musculoskeletal: Normal range of motion. She exhibits no edema or tenderness.  No calf tenderness  Neurological: She is alert and  oriented to person, place, and time.  Skin: Skin is warm and dry. No rash noted. No erythema.  Psychiatric: She has a normal mood and affect. Her behavior is normal.  Nursing note and vitals reviewed.    ED Treatments / Results  Labs (all labs ordered are listed, but only abnormal results are displayed) Labs Reviewed  BASIC METABOLIC PANEL - Abnormal; Notable for the following components:      Result Value   Glucose, Bld 109 (*)    Creatinine, Ser 1.48 (*)    Calcium 8.6 (*)    GFR calc non Af Amer 33 (*)    GFR calc Af Amer 38 (*)    All other components within normal limits  CBC  TROPONIN I    EKG EKG Interpretation  Date/Time:  Wednesday September 07 2017 18:06:28 EDT Ventricular Rate:  73 PR Interval:    QRS Duration: 103 QT Interval:  419 QTC Calculation: 462 R Axis:   7 Text Interpretation:  Sinus rhythm Low voltage, precordial leads No significant change since last tracing Confirmed by Gwyneth Sprout ([id number]) on 09/07/2017  6:10:18 PM   Radiology Dg Chest 2 View  Result Date: 09/07/2017 CLINICAL DATA:  Left chest pain radiating to arm and jaw for 1 week. History of COPD, hypertension. EXAM: CHEST - 2 VIEW COMPARISON:  Chest radiograph June 07, 2017 FINDINGS: Cardiac silhouette is mildly enlarged unchanged. Mediastinal silhouette is not suspicious. Stable left lung base scarring. No pleural effusion or focal consolidation. Increased lung volumes with flattened hemidiaphragms. No pneumothorax. Moderate degenerative change of the thoracic spine. IMPRESSION: Mild cardiomegaly. Mild hyperinflation without focal consolidation. Electronically Signed   By: Awilda Metro M.D.   On: 09/07/2017 18:43    Procedures Procedures (including critical care time)  Medications Ordered in ED Medications  albuterol (PROVENTIL) (2.5 MG/3ML) 0.083% nebulizer solution 5 mg (has no administration in time range)     Initial Impression / Assessment and Plan / ED Course  I have reviewed the triage vital signs and the nursing notes.  Pertinent labs & imaging results that were available during my care of the patient were reviewed by me and considered in my medical decision making (see chart for details).    Patient is a 77 year old female presenting today with atypical chest pain that is been present throughout the day after she woke up this morning.  Does not seem to be associated with eating, exertion.  She has felt slightly nauseated but no vomiting.  She is also stating she has had a cough and congestion for the last few days and wheezing.  She does not appear to be fluid overloaded on exam and she is in no acute distress.  Patient is wheezing on exam and states she has no inhalers at home in the setting of a history of COPD.  Patient denies any infectious symptoms consistent with pneumonia.  She has had a headache as well for the last week which started with the congestion.  She has been taking acetaminophen for this but is not  improving.  Patient has no abdominal tenderness on exam concerning for acute abdominal pathology.  Low suspicion for PE, dissection or ACS.  Patient had a catheterization done just a few months ago that showed only 20% stenosis in the RCA and otherwise disease-free heart.  Patient's EKG is unchanged.  CBC, BMP, troponin, chest x-ray pending.  Patient given albuterol.  8:12 PM Patient's labs and imaging are reassuring.  Feel patient's symptoms is most likely  related to COPD exacerbation.  After first neb of albuterol patient states she starting to feel better.  However still wheezing on exam.  Will give prednisone, albuterol and Atrovent.  9:29 PM Patient's wheezing is now resolved.  Labs are stable.  Patient's troponin is less than 0.03 after having 12 hours of pain.  With an unchanged EKG low suspicion the patient's symptoms are related to ACS.  Patient has no focal abdominal pain concerning for abdominal pathology at this time.  Patient is able to get up and move around without any evidence of hypoxia.  Will send home with prednisone and an inhaler.  Final Clinical Impressions(s) / ED Diagnoses   Final diagnoses:  COPD exacerbation [facility](HCC)    ED Discharge Orders        Ordered    predniSONE (DELTASONE) 20 MG tablet  Daily     09/07/17 2127       Gwyneth SproutPlunkett, Marjory Meints, MD 09/07/17 2130

## 2017-09-07 NOTE — Telephone Encounter (Signed)
Pt called because she is having chest pain, taking the SL NTG  3 times today and still having some pain now. She does not know if it is acid reflux, she denies burning or sour taste in her mouth. She is denying other cardiac symptoms now.  Advised to to ED to be checked out. Pt voiced understanding and he daughter will take her. [facility]  Reason for Disposition . Chest pain lasts > 5 minutes (Exceptions: chest pain occurring > 3 days ago and now asymptomatic; same as previously diagnosed heartburn and has accompanying sour taste in mouth)  Answer Assessment - Initial Assessment Questions 1. LOCATION: "Where does it hurt?"       Mid chest and to the left 2. RADIATION: "Does the pain go anywhere else?" (e.g., into neck, jaw, arms, back)     no 3. ONSET: "When did the chest pain begin?" (Minutes, hours or days)      Going on all day 4. PATTERN "Does the pain come and go, or has it been constant since it started?"  "Does it get worse with exertion?"      Comes and goes 5. DURATION: "How long does it last" (e.g., seconds, minutes, hours)     In about 15 mins after taking the pill under her tongue 6. SEVERITY: "How bad is the pain?"  (e.g., Scale 1-10; mild, moderate, or severe)    - MILD (1-3): doesn't interfere with normal activities     - MODERATE (4-7): interferes with normal activities or awakens from sleep    - SEVERE (8-10): excruciating pain, unable to do any normal activities       Pain # 8 7. CARDIAC RISK FACTORS: "Do you have any history of heart problems or risk factors for heart disease?" (e.g., prior heart attack, angina; high blood pressure, diabetes, being overweight, high cholesterol, smoking, or strong family history of heart disease)     High blood pressure, thinks mom had hx and probably father also 8. PULMONARY RISK FACTORS: "Do you have any history of lung disease?"  (e.g., blood clots in lung, asthma, emphysema, birth control  pills)     no 9. CAUSE: "What do you think is causing the chest pain?"     Possibly acid reflux 10. OTHER SYMPTOMS: "Do you have any other symptoms?" (e.g., dizziness, nausea, vomiting, sweating, fever, difficulty breathing, cough)       Dizziness earlier 11. PREGNANCY: "Is there any chance you are pregnant?" "When was your last menstrual period?"       no  Protocols used: CHEST PAIN-A-AH

## 2017-09-07 NOTE — ED Notes (Signed)
BP cuff repositioned and BP rechecked

## 2017-09-07 NOTE — ED Triage Notes (Signed)
Left side chest pain that started a few hours ago, SOB and nausea, pt also c/o headache for the last several days

## 2017-09-07 NOTE — ED Notes (Signed)
Patient transported to X-ray 

## 2017-09-08 ENCOUNTER — Ambulatory Visit: Payer: Self-pay | Admitting: *Deleted

## 2017-09-08 NOTE — Telephone Encounter (Signed)
Noted  

## 2017-09-08 NOTE — Telephone Encounter (Signed)
Patient newly diagnosed with COPD yesterday at ER. She called with a headache today asking if she can take tylenol. Offered Advice care-she can take tylenol for H/A. Also, encouraged her to pick up prescription given to her by emergency room physician, yesterday. She will pick up prescription later today.   Reason for Disposition . Caller has medication question only, adult not sick, and triager answers question  Answer Assessment - Initial Assessment Questions 1. SYMPTOMS: "Do you have any symptoms?"     headache 2. SEVERITY: If symptoms are present, ask "Are they mild, moderate or severe?"  Protocols used: MEDICATION QUESTION CALL-A-AH

## 2017-09-09 ENCOUNTER — Ambulatory Visit: Payer: Self-pay | Admitting: *Deleted

## 2017-09-09 ENCOUNTER — Other Ambulatory Visit: Payer: Self-pay | Admitting: Family

## 2017-09-09 NOTE — Telephone Encounter (Signed)
Pt advised to check with her pharmacy for any drug interactions. She has tylenol already prescribed in her chart. Advised that with any possibility of having a drug interaction her pharmacist should notify her and if needed her provider. Pt voiced understanding.  Reason for Disposition . Caller has medication question only, adult not sick, and triager answers question  Answer Assessment - Initial Assessment Questions 1. SYMPTOMS: "Do you have any symptoms?"   Pt has a headache and wants to know if she can Tylenol and her prednisone.  Protocols used: MEDICATION QUESTION CALL-A-AH

## 2017-09-09 NOTE — Telephone Encounter (Addendum)
Left detailed message on pt's voicemail regarding below information.

## 2017-09-09 NOTE — Telephone Encounter (Signed)
Ok to take tylenol and prednisone.

## 2017-09-09 NOTE — Telephone Encounter (Signed)
Last diazepam RX: 08/01/17, #20 Last OV: 05/31/17 Next OV: 09/13/17 UDS: 03/22/17, moderate risk CSC: 03/22/17 CSR: No discrepancies identified

## 2017-09-09 NOTE — Telephone Encounter (Signed)
fyi

## 2017-09-13 ENCOUNTER — Ambulatory Visit: Payer: Medicare HMO | Admitting: Family

## 2017-09-14 ENCOUNTER — Encounter (HOSPITAL_BASED_OUTPATIENT_CLINIC_OR_DEPARTMENT_OTHER): Payer: Self-pay | Admitting: *Deleted

## 2017-09-14 ENCOUNTER — Inpatient Hospital Stay (HOSPITAL_BASED_OUTPATIENT_CLINIC_OR_DEPARTMENT_OTHER)
Admission: EM | Admit: 2017-09-14 | Discharge: 2017-09-19 | DRG: 281 | Disposition: A | Discharge status: Home / Self Care | Payer: Medicare HMO | Attending: Internal Medicine | Admitting: Internal Medicine

## 2017-09-14 ENCOUNTER — Emergency Department (HOSPITAL_BASED_OUTPATIENT_CLINIC_OR_DEPARTMENT_OTHER): Payer: Medicare HMO

## 2017-09-14 ENCOUNTER — Other Ambulatory Visit: Payer: Self-pay

## 2017-09-14 DIAGNOSIS — R0602 Shortness of breath: Secondary | ICD-10-CM | POA: Diagnosis not present

## 2017-09-14 DIAGNOSIS — R778 Other specified abnormalities of plasma proteins: Secondary | ICD-10-CM

## 2017-09-14 DIAGNOSIS — Z886 Allergy status to analgesic agent status: Secondary | ICD-10-CM

## 2017-09-14 DIAGNOSIS — Z7982 Long term (current) use of aspirin: Secondary | ICD-10-CM

## 2017-09-14 DIAGNOSIS — I2510 Atherosclerotic heart disease of native coronary artery without angina pectoris: Secondary | ICD-10-CM | POA: Diagnosis present

## 2017-09-14 DIAGNOSIS — E876 Hypokalemia: Secondary | ICD-10-CM | POA: Diagnosis not present

## 2017-09-14 DIAGNOSIS — N183 Chronic kidney disease, stage 3 (moderate): Secondary | ICD-10-CM | POA: Diagnosis not present

## 2017-09-14 DIAGNOSIS — D72829 Elevated white blood cell count, unspecified: Secondary | ICD-10-CM

## 2017-09-14 DIAGNOSIS — K219 Gastro-esophageal reflux disease without esophagitis: Secondary | ICD-10-CM | POA: Diagnosis present

## 2017-09-14 DIAGNOSIS — F418 Other specified anxiety disorders: Secondary | ICD-10-CM | POA: Diagnosis present

## 2017-09-14 DIAGNOSIS — R1013 Epigastric pain: Secondary | ICD-10-CM | POA: Diagnosis present

## 2017-09-14 DIAGNOSIS — E875 Hyperkalemia: Secondary | ICD-10-CM | POA: Diagnosis not present

## 2017-09-14 DIAGNOSIS — Z87891 Personal history of nicotine dependence: Secondary | ICD-10-CM

## 2017-09-14 DIAGNOSIS — R079 Chest pain, unspecified: Secondary | ICD-10-CM | POA: Diagnosis not present

## 2017-09-14 DIAGNOSIS — Z79899 Other long term (current) drug therapy: Secondary | ICD-10-CM

## 2017-09-14 DIAGNOSIS — I5181 Takotsubo syndrome: Secondary | ICD-10-CM | POA: Diagnosis not present

## 2017-09-14 DIAGNOSIS — I10 Essential (primary) hypertension: Secondary | ICD-10-CM | POA: Diagnosis not present

## 2017-09-14 DIAGNOSIS — K0889 Other specified disorders of teeth and supporting structures: Secondary | ICD-10-CM | POA: Diagnosis present

## 2017-09-14 DIAGNOSIS — I2729 Other secondary pulmonary hypertension: Secondary | ICD-10-CM | POA: Diagnosis present

## 2017-09-14 DIAGNOSIS — E669 Obesity, unspecified: Secondary | ICD-10-CM | POA: Diagnosis present

## 2017-09-14 DIAGNOSIS — E785 Hyperlipidemia, unspecified: Secondary | ICD-10-CM | POA: Diagnosis present

## 2017-09-14 DIAGNOSIS — Z6379 Other stressful life events affecting family and household: Secondary | ICD-10-CM | POA: Diagnosis not present

## 2017-09-14 DIAGNOSIS — R748 Abnormal levels of other serum enzymes: Secondary | ICD-10-CM | POA: Diagnosis not present

## 2017-09-14 DIAGNOSIS — B9789 Other viral agents as the cause of diseases classified elsewhere: Secondary | ICD-10-CM | POA: Diagnosis present

## 2017-09-14 DIAGNOSIS — Z6834 Body mass index (BMI) 34.0-34.9, adult: Secondary | ICD-10-CM

## 2017-09-14 DIAGNOSIS — Z90710 Acquired absence of both cervix and uterus: Secondary | ICD-10-CM | POA: Diagnosis not present

## 2017-09-14 DIAGNOSIS — I129 Hypertensive chronic kidney disease with stage 1 through stage 4 chronic kidney disease, or unspecified chronic kidney disease: Secondary | ICD-10-CM | POA: Diagnosis present

## 2017-09-14 DIAGNOSIS — M109 Gout, unspecified: Secondary | ICD-10-CM | POA: Diagnosis present

## 2017-09-14 DIAGNOSIS — K279 Peptic ulcer, site unspecified, unspecified as acute or chronic, without hemorrhage or perforation: Secondary | ICD-10-CM | POA: Diagnosis present

## 2017-09-14 DIAGNOSIS — I214 Non-ST elevation (NSTEMI) myocardial infarction: Secondary | ICD-10-CM | POA: Diagnosis not present

## 2017-09-14 DIAGNOSIS — J441 Chronic obstructive pulmonary disease with (acute) exacerbation: Secondary | ICD-10-CM | POA: Diagnosis present

## 2017-09-14 DIAGNOSIS — R7989 Other specified abnormal findings of blood chemistry: Secondary | ICD-10-CM

## 2017-09-14 DIAGNOSIS — N179 Acute kidney failure, unspecified: Secondary | ICD-10-CM | POA: Diagnosis not present

## 2017-09-14 DIAGNOSIS — J449 Chronic obstructive pulmonary disease, unspecified: Secondary | ICD-10-CM | POA: Diagnosis present

## 2017-09-14 DIAGNOSIS — D631 Anemia in chronic kidney disease: Secondary | ICD-10-CM | POA: Diagnosis present

## 2017-09-14 DIAGNOSIS — E7800 Pure hypercholesterolemia, unspecified: Secondary | ICD-10-CM | POA: Diagnosis not present

## 2017-09-14 DIAGNOSIS — N189 Chronic kidney disease, unspecified: Secondary | ICD-10-CM | POA: Diagnosis present

## 2017-09-14 LAB — CBC WITH DIFFERENTIAL/PLATELET
BASOS PCT: 0 %
Basophils Absolute: 0.1 10*3/uL (ref 0.0–0.1)
EOS ABS: 0.6 10*3/uL (ref 0.0–0.7)
Eosinophils Relative: 4 %
HCT: 37.6 % (ref 36.0–46.0)
HEMOGLOBIN: 12.3 g/dL (ref 12.0–15.0)
LYMPHS ABS: 4 10*3/uL (ref 0.7–4.0)
Lymphocytes Relative: 25 %
MCH: 28.6 pg (ref 26.0–34.0)
MCHC: 32.7 g/dL (ref 30.0–36.0)
MCV: 87.4 fL (ref 78.0–100.0)
MONO ABS: 0.9 10*3/uL (ref 0.1–1.0)
MONOS PCT: 6 %
Neutro Abs: 10.4 10*3/uL — ABNORMAL HIGH (ref 1.7–7.7)
Neutrophils Relative %: 65 %
Platelets: 207 10*3/uL (ref 150–400)
RBC: 4.3 MIL/uL (ref 3.87–5.11)
RDW: 16.1 % — AB (ref 11.5–15.5)
WBC: 15.9 10*3/uL — ABNORMAL HIGH (ref 4.0–10.5)

## 2017-09-14 LAB — LIPASE, BLOOD: LIPASE: 23 U/L (ref 11–51)

## 2017-09-14 LAB — COMPREHENSIVE METABOLIC PANEL
ALT: 15 U/L (ref 14–54)
AST: 20 U/L (ref 15–41)
Albumin: 3.9 g/dL (ref 3.5–5.0)
Alkaline Phosphatase: 62 U/L (ref 38–126)
Anion gap: 11 (ref 5–15)
BUN: 24 mg/dL — AB (ref 6–20)
CALCIUM: 8.6 mg/dL — AB (ref 8.9–10.3)
CHLORIDE: 105 mmol/L (ref 101–111)
CO2: 25 mmol/L (ref 22–32)
CREATININE: 1.44 mg/dL — AB (ref 0.44–1.00)
GFR calc non Af Amer: 34 mL/min — ABNORMAL LOW (ref >60)
GFR, EST AFRICAN AMERICAN: 39 mL/min — AB (ref >60)
GLUCOSE: 105 mg/dL — AB (ref 65–99)
Potassium: 3.2 mmol/L — ABNORMAL LOW (ref 3.5–5.1)
SODIUM: 141 mmol/L (ref 135–145)
Total Bilirubin: 0.4 mg/dL (ref 0.3–1.2)
Total Protein: 7.3 g/dL (ref 6.5–8.1)

## 2017-09-14 LAB — TROPONIN I
TROPONIN I: 0.66 ng/mL — AB (ref <0.03)
TROPONIN I: 1.76 ng/mL — AB (ref <0.03)

## 2017-09-14 LAB — BRAIN NATRIURETIC PEPTIDE: B NATRIURETIC PEPTIDE 5: 162 pg/mL — AB (ref 0.0–100.0)

## 2017-09-14 MED ORDER — AZITHROMYCIN 250 MG PO TABS
250.0000 mg | ORAL_TABLET | Freq: Every day | ORAL | Status: AC
  Administered 2017-09-15 – 2017-09-18 (×4): 250 mg via ORAL
  Filled 2017-09-14 (×4): qty 1

## 2017-09-14 MED ORDER — ACETAMINOPHEN 325 MG PO TABS
650.0000 mg | ORAL_TABLET | Freq: Two times a day (BID) | ORAL | Status: DC | PRN
  Administered 2017-09-15 – 2017-09-18 (×6): 650 mg via ORAL
  Filled 2017-09-14 (×6): qty 2

## 2017-09-14 MED ORDER — ALUM & MAG HYDROXIDE-SIMETH 200-200-20 MG/5ML PO SUSP: 30.0000 mL | Freq: Four times a day (QID) | ORAL | Status: DC | PRN

## 2017-09-14 MED ORDER — METHYLPREDNISOLONE SODIUM SUCC 125 MG IJ SOLR
125.0000 mg | Freq: Once | INTRAMUSCULAR | Status: AC
  Administered 2017-09-14: 125 mg via INTRAVENOUS
  Filled 2017-09-14: qty 2

## 2017-09-14 MED ORDER — NITROGLYCERIN 2 % TD OINT
1.0000 [in_us] | TOPICAL_OINTMENT | Freq: Once | TRANSDERMAL | Status: AC
  Administered 2017-09-14: 1 [in_us] via TOPICAL
  Filled 2017-09-14: qty 1

## 2017-09-14 MED ORDER — ZOLPIDEM TARTRATE 5 MG PO TABS: 5.0000 mg | ORAL_TABLET | Freq: Every evening | ORAL | Status: DC | PRN

## 2017-09-14 MED ORDER — MECLIZINE HCL 25 MG PO TABS
25.0000 mg | ORAL_TABLET | Freq: Three times a day (TID) | ORAL | Status: DC | PRN
  Filled 2017-09-14: qty 1

## 2017-09-14 MED ORDER — SODIUM CHLORIDE 0.9 % IV SOLN
INTRAVENOUS | Status: DC
  Administered 2017-09-14 – 2017-09-15 (×2): via INTRAVENOUS

## 2017-09-14 MED ORDER — ACETAMINOPHEN 500 MG PO TABS
1000.0000 mg | ORAL_TABLET | Freq: Once | ORAL | Status: AC
  Administered 2017-09-14: 1000 mg via ORAL
  Filled 2017-09-14: qty 2

## 2017-09-14 MED ORDER — ALBUTEROL SULFATE (2.5 MG/3ML) 0.083% IN NEBU
5.0000 mg | INHALATION_SOLUTION | Freq: Once | RESPIRATORY_TRACT | Status: AC
  Administered 2017-09-14: 5 mg via RESPIRATORY_TRACT

## 2017-09-14 MED ORDER — IPRATROPIUM-ALBUTEROL 0.5-2.5 (3) MG/3ML IN SOLN
RESPIRATORY_TRACT | Status: AC
  Administered 2017-09-14: 3 mL
  Filled 2017-09-14: qty 3

## 2017-09-14 MED ORDER — HYDRALAZINE HCL 20 MG/ML IJ SOLN: 5.0000 mg | INTRAMUSCULAR | Status: DC | PRN

## 2017-09-14 MED ORDER — PANTOPRAZOLE SODIUM 40 MG IV SOLR
40.0000 mg | Freq: Two times a day (BID) | INTRAVENOUS | Status: DC
  Administered 2017-09-14 – 2017-09-16 (×5): 40 mg via INTRAVENOUS
  Filled 2017-09-14 (×6): qty 40

## 2017-09-14 MED ORDER — NITROGLYCERIN 0.4 MG SL SUBL
0.4000 mg | SUBLINGUAL_TABLET | SUBLINGUAL | Status: DC | PRN
  Administered 2017-09-14: 0.4 mg via SUBLINGUAL
  Filled 2017-09-14: qty 1

## 2017-09-14 MED ORDER — ESCITALOPRAM OXALATE 10 MG PO TABS
20.0000 mg | ORAL_TABLET | Freq: Every day | ORAL | Status: DC
  Administered 2017-09-15 – 2017-09-19 (×5): 20 mg via ORAL
  Filled 2017-09-14 (×2): qty 1
  Filled 2017-09-14 (×2): qty 2
  Filled 2017-09-14: qty 1

## 2017-09-14 MED ORDER — DICYCLOMINE HCL 10 MG PO CAPS
10.0000 mg | ORAL_CAPSULE | Freq: Three times a day (TID) | ORAL | Status: DC
  Administered 2017-09-15 – 2017-09-19 (×12): 10 mg via ORAL
  Filled 2017-09-14 (×14): qty 1

## 2017-09-14 MED ORDER — DM-GUAIFENESIN ER 30-600 MG PO TB12
1.0000 | ORAL_TABLET | Freq: Two times a day (BID) | ORAL | Status: DC | PRN
  Filled 2017-09-14: qty 1

## 2017-09-14 MED ORDER — DIAZEPAM 5 MG PO TABS
10.0000 mg | ORAL_TABLET | Freq: Two times a day (BID) | ORAL | Status: DC | PRN
  Administered 2017-09-14 – 2017-09-19 (×7): 10 mg via ORAL
  Filled 2017-09-14 (×7): qty 2

## 2017-09-14 MED ORDER — BISMUTH SUBSALICYLATE 262 MG/15ML PO SUSP
30.0000 mL | Freq: Four times a day (QID) | ORAL | Status: DC | PRN
  Filled 2017-09-14: qty 236

## 2017-09-14 MED ORDER — AZITHROMYCIN 250 MG PO TABS
500.0000 mg | ORAL_TABLET | Freq: Every day | ORAL | Status: AC
  Administered 2017-09-14: 500 mg via ORAL
  Filled 2017-09-14: qty 2

## 2017-09-14 MED ORDER — ALBUTEROL SULFATE (2.5 MG/3ML) 0.083% IN NEBU
INHALATION_SOLUTION | RESPIRATORY_TRACT | Status: AC
  Administered 2017-09-14: 2.5 mg
  Filled 2017-09-14: qty 3

## 2017-09-14 MED ORDER — LISINOPRIL 10 MG PO TABS
10.0000 mg | ORAL_TABLET | Freq: Every day | ORAL | Status: DC
  Administered 2017-09-15: 10 mg via ORAL
  Filled 2017-09-14: qty 1

## 2017-09-14 MED ORDER — ATORVASTATIN CALCIUM 80 MG PO TABS
80.0000 mg | ORAL_TABLET | Freq: Every day | ORAL | Status: DC
  Administered 2017-09-14 – 2017-09-18 (×5): 80 mg via ORAL
  Filled 2017-09-14 (×5): qty 1

## 2017-09-14 MED ORDER — NITROGLYCERIN IN D5W 200-5 MCG/ML-% IV SOLN
0.0000 ug/min | INTRAVENOUS | Status: DC
  Administered 2017-09-14: 5 ug/min via INTRAVENOUS
  Administered 2017-09-15 – 2017-09-16 (×2): 60 ug/min via INTRAVENOUS
  Filled 2017-09-14 (×3): qty 250

## 2017-09-14 MED ORDER — LEVALBUTEROL HCL 1.25 MG/0.5ML IN NEBU
1.2500 mg | INHALATION_SOLUTION | Freq: Four times a day (QID) | RESPIRATORY_TRACT | Status: DC
  Administered 2017-09-15 (×2): 1.25 mg via RESPIRATORY_TRACT
  Filled 2017-09-14 (×3): qty 0.5

## 2017-09-14 MED ORDER — MORPHINE SULFATE (PF) 2 MG/ML IV SOLN
2.0000 mg | INTRAVENOUS | Status: DC | PRN
  Administered 2017-09-15: 2 mg via INTRAVENOUS
  Filled 2017-09-14 (×2): qty 1

## 2017-09-14 MED ORDER — IPRATROPIUM BROMIDE 0.02 % IN SOLN
0.5000 mg | RESPIRATORY_TRACT | Status: DC
  Administered 2017-09-14 – 2017-09-15 (×3): 0.5 mg via RESPIRATORY_TRACT
  Filled 2017-09-14 (×4): qty 2.5

## 2017-09-14 MED ORDER — DIAZEPAM 5 MG PO TABS
5.0000 mg | ORAL_TABLET | Freq: Once | ORAL | Status: AC
  Administered 2017-09-14: 5 mg via ORAL
  Filled 2017-09-14: qty 1

## 2017-09-14 MED ORDER — ALBUTEROL SULFATE (2.5 MG/3ML) 0.083% IN NEBU
INHALATION_SOLUTION | RESPIRATORY_TRACT | Status: AC
  Administered 2017-09-14: 5 mg
  Filled 2017-09-14: qty 12

## 2017-09-14 MED ORDER — NYSTATIN 100000 UNIT/GM EX POWD
1.0000 g | Freq: Three times a day (TID) | CUTANEOUS | Status: DC | PRN
  Filled 2017-09-14: qty 15

## 2017-09-14 MED ORDER — HEPARIN (PORCINE) IN NACL 100-0.45 UNIT/ML-% IJ SOLN
850.0000 [IU]/h | INTRAMUSCULAR | Status: DC
  Administered 2017-09-14 – 2017-09-15 (×2): 850 [IU]/h via INTRAVENOUS
  Filled 2017-09-14 (×2): qty 250

## 2017-09-14 MED ORDER — ONDANSETRON HCL 4 MG/2ML IJ SOLN: 4.0000 mg | Freq: Three times a day (TID) | INTRAMUSCULAR | Status: DC | PRN

## 2017-09-14 MED ORDER — METHYLPREDNISOLONE SODIUM SUCC 40 MG IJ SOLR
40.0000 mg | Freq: Three times a day (TID) | INTRAMUSCULAR | Status: DC
  Administered 2017-09-14 – 2017-09-17 (×7): 40 mg via INTRAVENOUS
  Filled 2017-09-14 (×7): qty 1

## 2017-09-14 MED ORDER — HEPARIN BOLUS VIA INFUSION
4000.0000 [IU] | Freq: Once | INTRAVENOUS | Status: AC
  Administered 2017-09-14: 4000 [IU] via INTRAVENOUS

## 2017-09-14 MED ORDER — ALBUTEROL SULFATE (2.5 MG/3ML) 0.083% IN NEBU
2.5000 mg | INHALATION_SOLUTION | RESPIRATORY_TRACT | Status: DC
  Administered 2017-09-14: 2.5 mg via RESPIRATORY_TRACT
  Filled 2017-09-14: qty 3

## 2017-09-14 MED ORDER — ALLOPURINOL 100 MG PO TABS
200.0000 mg | ORAL_TABLET | Freq: Every day | ORAL | Status: DC
  Administered 2017-09-15 – 2017-09-19 (×5): 200 mg via ORAL
  Filled 2017-09-14 (×5): qty 2

## 2017-09-14 MED ORDER — POTASSIUM CHLORIDE 20 MEQ/15ML (10%) PO SOLN
40.0000 meq | Freq: Once | ORAL | Status: AC
  Administered 2017-09-14: 40 meq via ORAL
  Filled 2017-09-14: qty 30

## 2017-09-14 MED ORDER — ASPIRIN 81 MG PO CHEW
324.0000 mg | CHEWABLE_TABLET | Freq: Once | ORAL | Status: AC
  Administered 2017-09-14: 324 mg via ORAL
  Filled 2017-09-14: qty 4

## 2017-09-14 MED ORDER — ASPIRIN EC 81 MG PO TBEC
81.0000 mg | DELAYED_RELEASE_TABLET | Freq: Every day | ORAL | Status: DC
  Administered 2017-09-15 – 2017-09-19 (×5): 81 mg via ORAL
  Filled 2017-09-14 (×5): qty 1

## 2017-09-14 MED ORDER — MORPHINE SULFATE (PF) 2 MG/ML IV SOLN
2.0000 mg | Freq: Once | INTRAVENOUS | Status: AC
  Administered 2017-09-14: 2 mg via INTRAVENOUS
  Filled 2017-09-14: qty 1

## 2017-09-14 NOTE — ED Triage Notes (Addendum)
Pt o SOB  X 3 hrs , unable to complete sentence d/t SOB HX copd , pt also states CP x 3 hrs

## 2017-09-14 NOTE — ED Notes (Signed)
[facility] arrived, care turned over.

## 2017-09-14 NOTE — H&P (Addendum)
History and Physical    MISTY FOUTZ KGM:[id number] DOB: February 03, 1941 DOA: 09/14/2017  Referring MD/NP/PA:   PCP: [name], NP   Patient coming from:  The patient is coming from home.  At baseline, pt is independent for most of ADL.   Chief Complaint: chest pain, SOB, cough, epigastric abdominal pain  HPI: Veronica Wolf is a 77 y.o. female with medical history significant of hypertension, hyperlipidemia, COPD, GERD, gout, depression with anxiety, CKD 3, peptic ulcer disease, who presents with chest pain, shortness of breath, cough, epigastric abdominal pain.  Patient states that she started having cough and shortness of breath this afternoon, which has been progressively getting worse.  She has a little mucus production.  No fever or chills.  She also reports chest pain at the same times.  The chest pain is located in the substernal area, constant, 8 out of 10 severity, pressure-like, nonradiating.  It is not pleuritic, not aggravated by deep breath or coughing.  No tenderness in the calf areas.  No recent long distance traveling.  Patient did not have abdominal pain at home, but she started having epigastric abdominal pain in the hospital.  It is constant, 8 out of 10 severity, nonradiating.  Not associated with nausea vomiting or diarrhea.  No hematochezia or hematemesis.  Patient denies symptoms of UTI or unilateral weakness.  Patient was found to have an elevated troponin 0 0.66.  The ED physician discussed with on-call cardiologist (they did not write down cardiologist/s name), who recommended to start patient with IV heparin. Pt states that she has a lot stress due to family issues recently.  ED Course: pt was found to have troponin 0 0.66, WBC 15.9, BNP was 62, potassium 3.2, renal function close to baseline, temperature normal, tachycardia, tachypnea, oxygen saturation 91 to 96% on room air.  Chest x-ray showed vascular congestion. patient is admitted to stepdown as  inpatient.  Review of Systems:   General: no fevers, chills, no body weight gain, has poor appetite, has fatigue HEENT: no blurry vision, hearing changes or sore throat Respiratory: has dyspnea, coughing, wheezing CV: has chest pain, no palpitations GI: no nausea, vomiting, has abdominal pain, no diarrhea, constipation GU: no dysuria, burning on urination, increased urinary frequency, hematuria  Ext: no leg edema Neuro: no unilateral weakness, numbness, or tingling, no vision change or hearing loss Skin: no rash, no skin tear. MSK: No muscle spasm, no deformity, no limitation of range of movement in spin Heme: No easy bruising.  Travel history: No recent long distant travel.  Allergy:  Allergies  Allergen Reactions  . Ibuprofen Other (See Comments)    Pt has hx of peptic ulcer and diverticulitis.     Past Medical History:  Diagnosis Date  . Allergic rhinitis   . Anxiety   . CKD (chronic kidney disease) 04/04/2017  . COPD (chronic obstructive pulmonary disease) (HCC)   . Depression   . GERD (gastroesophageal reflux disease)   . Hyperlipidemia   . Hypertension   . Peptic ulcer 01/04/2003  . Vertigo     Past Surgical History:  Procedure Laterality Date  . ABDOMINAL HYSTERECTOMY    . LEFT HEART CATH AND CORONARY ANGIOGRAPHY N/A 06/13/2017   Procedure: LEFT HEART CATH AND CORONARY ANGIOGRAPHY;  Surgeon: Marykay Lex, MD;  Location: [facility] INVASIVE CV LAB;  Service: Cardiovascular;  Laterality: N/A;    Social History:  reports that she has quit smoking. She has never used smokeless tobacco. She reports that she does  not drink alcohol or use drugs.  Family History: History reviewed. No pertinent family history.   Prior to Admission medications   Medication Sig Start Date End Date Taking? Authorizing Provider  acetaminophen (TYLENOL) 500 MG tablet Take 500-1,000 mg by mouth 2 (two) times daily as needed for headache.    [provider]  albuterol (PROVENTIL) (2.5  MG/3ML) 0.083% nebulizer solution Take 3 mLs (2.5 mg total) by nebulization every 6 (six) hours as needed for wheezing or shortness of breath. Patient not taking: Reported on 05/31/2017 03/03/17   [name]'Sullivan, Melissa, NP  allopurinol (ZYLOPRIM) 100 MG tablet Take 2 tablets (200 mg total) by mouth daily. 03/23/17   [name]'Sullivan, Melissa, NP  aspirin EC 81 MG tablet Take 1 tablet (81 mg total) by mouth daily. 06/07/17   Revankar, Aundra Dubinajan R, MD  bismuth subsalicylate (PEPTO BISMOL) 262 MG/15ML suspension Take 30 mLs by mouth every 6 (six) hours as needed for indigestion or diarrhea or loose stools.    [provider]  diazepam (VALIUM) 10 MG tablet TAKE 1 TABLET BY MOUTH EVERY 12 HOURS AS NEEDED FOR ANXIETY 09/10/17   [name]'Sullivan, Melissa, NP  dicyclomine (BENTYL) 10 MG capsule Take 10 mg by mouth 3 (three) times daily.    [provider]  escitalopram (LEXAPRO) 20 MG tablet Take 1 tablet (20 mg total) by mouth daily. 05/31/17   [name]'Sullivan, Melissa, NP  esomeprazole (NEXIUM) 20 MG capsule Take 20 mg by mouth daily as needed (GERD).    [provider]  lisinopril (PRINIVIL,ZESTRIL) 10 MG tablet Take 1 tablet (10 mg total) by mouth daily. 09/02/17 10/02/17  [name]'Sullivan, Melissa, NP  meclizine (ANTIVERT) 25 MG tablet TAKE 1 TABLET BY MOUTH THREE TIMES DAILY AS NEEDED FOR  DIZZINESS 07/27/17   [name]'Sullivan, Melissa, NP  nitroGLYCERIN (NITROSTAT) 0.4 MG SL tablet Place 1 tablet (0.4 mg total) under the tongue every 5 (five) minutes as needed. Patient taking differently: Place 0.4 mg under the tongue every 5 (five) minutes as needed for chest pain.  06/07/17 09/05/17  Revankar, Aundra Dubinajan R, MD  nystatin (NYSTATIN) powder Apply 1 g topically 3 (three) times daily as needed (rash).    [provider]  pantoprazole (PROTONIX) 40 MG tablet Take 1 tablet (40 mg total) by mouth daily. 05/31/17   [name]'Sullivan, Melissa, NP  predniSONE (DELTASONE) 20 MG tablet Take 2 tablets (40 mg total) by mouth daily. 09/07/17    Gwyneth SproutPlunkett, Whitney, MD    Physical Exam: Vitals:   09/14/17 1800 09/14/17 1830 09/14/17 1900 09/14/17 2024  BP: (!) 153/96 (!) 145/96 140/88 (!) 141/90  Pulse: 99 (!) 103 (!) 108 96  Resp: 16 20 (!) 21   Temp:    97.7 F (36.5 C)  TempSrc:    Oral  SpO2: 97% 93% 91% 96%  Weight:      Height:       General: Not in acute distress HEENT:       Eyes: PERRL, EOMI, no scleral icterus.       ENT: No discharge from the ears and nose, no pharynx injection, no tonsillar enlargement.        Neck: No JVD, no bruit, no mass felt. Heme: No neck lymph node enlargement. Cardiac: S1/S2, RRR, No murmurs, No gallops or rubs. Respiratory: Has wheezing bilaterally.   GI: Soft, nondistended, has epigastric tenderness, no rebound pain, no organomegaly, BS present. GU: No hematuria Ext: No pitting leg edema bilaterally. 2+DP/PT pulse bilaterally. Musculoskeletal: No joint deformities, No joint redness or  warmth, no limitation of ROM in spin. Skin: No rashes.  Neuro: Alert, oriented X3, cranial nerves II-XII grossly intact, moves all extremities normally.  Psych: Patient is not psychotic, no suicidal or hemocidal ideation.  Labs on Admission: I have personally reviewed following labs and imaging studies  CBC: Recent Labs  Lab 09/14/17 1510  WBC 15.9*  NEUTROABS 10.4*  HGB 12.3  HCT 37.6  MCV 87.4  PLT 207   Basic Metabolic Panel: Recent Labs  Lab 09/14/17 1510  NA 141  K 3.2*  CL 105  CO2 25  GLUCOSE 105*  BUN 24*  CREATININE 1.44*  CALCIUM 8.6*   GFR: Estimated Creatinine Clearance: 33.5 mL/min (A) (by C-G formula based on SCr of 1.44 mg/dL (H)). Liver Function Tests: Recent Labs  Lab 09/14/17 1510  AST 20  ALT 15  ALKPHOS 62  BILITOT 0.4  PROT 7.3  ALBUMIN 3.9   No results for input(s): LIPASE, AMYLASE in the last 168 hours. No results for input(s): AMMONIA in the last 168 hours. Coagulation Profile: No results for input(s): INR, PROTIME in the last 168  hours. Cardiac Enzymes: Recent Labs  Lab 09/14/17 1510  TROPONINI 0.66*   BNP (last 3 results) No results for input(s): PROBNP in the last 8760 hours. HbA1C: No results for input(s): HGBA1C in the last 72 hours. CBG: No results for input(s): GLUCAP in the last 168 hours. Lipid Profile: No results for input(s): CHOL, HDL, LDLCALC, TRIG, CHOLHDL, LDLDIRECT in the last 72 hours. Thyroid Function Tests: No results for input(s): TSH, T4TOTAL, FREET4, T3FREE, THYROIDAB in the last 72 hours. Anemia Panel: No results for input(s): VITAMINB12, FOLATE, FERRITIN, TIBC, IRON, RETICCTPCT in the last 72 hours. Urine analysis:    Component Value Date/Time   COLORURINE STRAW (A) 04/05/2017 0910   APPEARANCEUR CLEAR 04/05/2017 0910   LABSPEC 1.010 04/05/2017 0910   PHURINE 6.0 04/05/2017 0910   GLUCOSEU NEGATIVE 04/05/2017 0910   HGBUR NEGATIVE 04/05/2017 0910   BILIRUBINUR NEGATIVE 04/05/2017 0910   KETONESUR NEGATIVE 04/05/2017 0910   PROTEINUR NEGATIVE 04/05/2017 0910   NITRITE NEGATIVE 04/05/2017 0910   LEUKOCYTESUR NEGATIVE 04/05/2017 0910   Sepsis Labs: @LABRCNTIP (procalcitonin:4,lacticidven:4) )No results found for this or any previous visit (from the past 240 hour(s)).   Radiological Exams on Admission: Dg Chest Portable 1 View  Result Date: 09/14/2017 CLINICAL DATA:  Shortness of breath and chest pain EXAM: PORTABLE CHEST 1 VIEW COMPARISON:  September 07, 2017 FINDINGS: Mild cardiomegaly. The hila and mediastinum are normal. Mild interstitial prominence without overt edema. Mild bibasilar atelectasis. IMPRESSION: Cardiomegaly. Pulmonary venous congestion. Probable atelectasis in the bases. Electronically Signed   By: Gerome Sam III M.D   On: 09/14/2017 16:08     EKG: Independently reviewed.  Sinus rhythm, QTC 451, low voltage, anteroseptal infarction pattern   Assessment/Plan Principal Problem:   Chest pain Active Problems:   Epigastric abdominal pain   GERD  (gastroesophageal reflux disease)   Gout   HLD (hyperlipidemia)   CKD (chronic kidney disease), stage III (HCC)   COPD exacerbation (HCC)   Essential hypertension   Hypokalemia   NSTEMI (non-ST elevated myocardial infarction) (HCC)   Chest pain and NSTEMI: trop 0.66-->1.76. pt still has persistent CP. Initial EKG showed low voltage and anteroseptal infarction pattern, and the repeated EKG showed new ST depression in lateral leads. Cardiology, Dr. Deforest Hoyles was consulted.  - will admit to [facility] as inpt - On IV heparin - Nitroglycerin drip - cycle CE q6 x3 and repeat EKG  in the am  - prn Nitroglycerin, Morphine, and aspirin, lipitor  - Risk factor stratification: will check FLP, UDS and A1C  - 2d echo - f/u cardiology's recommendation  COPD exacerbation (HCC): -Nebulizers: scheduled Atrovent and prn Xopenex Nebs -Solu-Medrol 40 mg IV tid -Z pak -Mucinex for cough  -Incentive spirometry -Urine S. pneumococcal antigen -Follow up blood culture x2, sputum culture, respiratory virus panel -Nasal cannula oxygen as needed to maintain O2 saturation 92% or greater  Epigastric abdominal pain: Etiology is not clear. Patient has history of GERD and peptic ulcer disease. She received one dose of aspirin 325 mg in ED, which may have partially contributed. Patient is at risk of gastric perforation due to history of peptic ulcer, but on physical examination she does not have acute abdomen. -Decrease aspirin dose to 81 mg daily -Start Protonix 40 mg twice a day IV -When necessary Mylanta  GERD and hx of peptic ulcer disease -see above -also on bismuth subsalicylate  HLD: -lipitor  Gout: Stable. -Continue allopurinol  CKD (chronic kidney disease), stage III (HCC): Stable. Baseline creatinine 1.2-1.4. Her creatinine is 1.44, BUN 24 -f/u renal fx by BMP  Essential hypertension: -continue Lisinopril -IV hydralazine when necessary  Hypokalemia: K=3.2 -repeated  Depression and  anxiety: -Continue home Valium, Lexapro -add prn xanax    DVT ppx: on IV Heparin          Code Status: Full code Family Communication:  Yes, patient's 2 friends  at bed side Disposition Plan:  Anticipate discharge back to previous home environment Consults called:  EDP discussed with on-call cardiologist (they did not write down cardiologist's name). Admission status: SDU/inpation       Date of Service 09/14/2017    Lorretta Harp [facility] Pager [phone]  If 7PM-7AM, please contact night-coverage [email or web address] Password [facility] 09/14/2017, 9:24 PM

## 2017-09-14 NOTE — Progress Notes (Signed)
[facility] [place]

## 2017-09-14 NOTE — ED Provider Notes (Signed)
Emergency Department Provider Note   I have reviewed the triage vital signs and the nursing notes.   HISTORY  Chief Complaint Shortness of Breath   HPI Veronica Wolf is a 77 y.o. female with PMH of CKD, COPD, GERD, HLD, and HTN presents to the ED for evaluation of SOB and CP. SOB has been worsening over the last 3 days with central CP starting last night. Patient has been trying nebs at home with no relief in symptoms. The patient was seen on 09/07/17 for CP and SOB. She improved with nebs and was ultimately discharged home. No PCP or Cardiology follow up since. No fever/chills. No vomiting or diarrhea. SOB worse with exertion. No change in CP quality or intensity since yesterday.    Past Medical History:  Diagnosis Date  . Allergic rhinitis   . Anxiety   . CKD (chronic kidney disease) 04/04/2017  . COPD (chronic obstructive pulmonary disease) (HCC)   . Depression   . GERD (gastroesophageal reflux disease)   . Hyperlipidemia   . Hypertension   . Peptic ulcer 01/04/2003  . Vertigo     Patient Active Problem List   Diagnosis Date Noted  . COPD exacerbation (HCC) 09/14/2017  . Angina pectoris (HCC) 06/07/2017  . CKD (chronic kidney disease) 04/04/2017  . Allergic rhinitis   . Gout 02/28/2017  . Depression with anxiety 02/28/2017  . Epigastric pain 12/22/2016  . COPD with chronic bronchitis (HCC) 12/22/2016  . GERD (gastroesophageal reflux disease) 12/22/2016  . DOE (dyspnea on exertion) 12/22/2016    Past Surgical History:  Procedure Laterality Date  . ABDOMINAL HYSTERECTOMY    . LEFT HEART CATH AND CORONARY ANGIOGRAPHY N/A 06/13/2017   Procedure: LEFT HEART CATH AND CORONARY ANGIOGRAPHY;  Surgeon: Marykay Lex, MD;  Location: [facility] INVASIVE CV LAB;  Service: Cardiovascular;  Laterality: N/A;    Current Outpatient Rx  . Order #: [id number] Class: Historical Med  . Order #: [id number] Class: Normal  . Order #: [id number] Class: Normal  . Order #: [id number] Class: No  Print  . Order #: [id number] Class: Historical Med  . Order #: [id number] Class: Normal  . Order #: [id number] Class: Historical Med  . Order #: [id number] Class: Normal  . Order #: [id number] Class: Historical Med  . Order #: [id number] Class: Normal  . Order #: [id number] Class: Normal  . Order #: [id number] Class: Normal  . Order #: [id number] Class: Historical Med  . Order #: [id number] Class: Normal  . Order #: [id number] Class: Print    Allergies Ibuprofen  History reviewed. No pertinent family history.  Social History Social History   Tobacco Use  . Smoking status: Former Games developer  . Smokeless tobacco: Never Used  Substance Use Topics  . Alcohol use: No    Comment: has previous hx of ETOH abuse, quit 2014  . Drug use: No    Review of Systems  Constitutional: No fever/chills Eyes: No visual changes. ENT: No sore throat. Cardiovascular: Positive chest pain. Respiratory: Positive shortness of breath. Gastrointestinal: No abdominal pain.  No nausea, no vomiting.  No diarrhea.  No constipation. Genitourinary: Negative for dysuria. Musculoskeletal: Negative for back pain. Skin: Negative for rash. Neurological: Negative for headaches, focal weakness or numbness.  10-point ROS otherwise negative.  ____________________________________________   PHYSICAL EXAM:  VITAL SIGNS: ED Triage Vitals  Enc Vitals Group     BP 09/14/17 1503 134/79     Pulse Rate 09/14/17 1503 86     Resp 09/14/17 1458 (!) 24     Temp 09/14/17 1503 (!) 97.5 F (36.4  C)     Temp Source 09/14/17 1503 Axillary     SpO2 09/14/17 1458 97 %     Weight 09/14/17 1503 190 lb (86.2 kg)     Height 09/14/17 1503 5\' 3"  (1.6 m)     Pain Score 09/14/17 1457 0   Constitutional: Alert and oriented. Patient breathing comfortably when silent but increased WOB with any speaking.  Eyes: Conjunctivae are normal.  Head: Atraumatic. Nose: No congestion/rhinnorhea. Mouth/Throat: Mucous membranes are moist.  Neck: No stridor.     Cardiovascular: Normal rate, regular rhythm. Good peripheral circulation. Grossly normal heart sounds.   Respiratory: Increased respiratory effort.  No retractions. Lungs with end-expiratory wheezing throughout.  Gastrointestinal: Soft and nontender. No distention.  Musculoskeletal: No lower extremity tenderness nor edema. No gross deformities of extremities. Neurologic:  Normal speech and language. No gross focal neurologic deficits are appreciated.  Skin:  Skin is warm, dry and intact. No rash noted.  ____________________________________________   LABS (all labs ordered are listed, but only abnormal results are displayed)  Labs Reviewed  TROPONIN I - Abnormal; Notable for the following components:      Result Value   Troponin I 0.66 (*)    All other components within normal limits  COMPREHENSIVE METABOLIC PANEL - Abnormal; Notable for the following components:   Potassium 3.2 (*)    Glucose, Bld 105 (*)    BUN 24 (*)    Creatinine, Ser 1.44 (*)    Calcium 8.6 (*)    GFR calc non Af Amer 34 (*)    GFR calc Af Amer 39 (*)    All other components within normal limits  CBC WITH DIFFERENTIAL/PLATELET - Abnormal; Notable for the following components:   WBC 15.9 (*)    RDW 16.1 (*)    Neutro Abs 10.4 (*)    All other components within normal limits  BRAIN NATRIURETIC PEPTIDE - Abnormal; Notable for the following components:   B Natriuretic Peptide 162.0 (*)    All other components within normal limits  HEPARIN LEVEL (UNFRACTIONATED)  HEPARIN LEVEL (UNFRACTIONATED)  CBC   ____________________________________________  EKG   EKG Interpretation  Date/Time:  Wednesday September 14 2017 15:01:45 EDT Ventricular Rate:  86 PR Interval:    QRS Duration: 81 QT Interval:  377 QTC Calculation: 451 R Axis:     Text Interpretation:  Sinus rhythm Anterior infarct, old Non-specific ST-t changes Confirmed by Raeford Razor [phone]) on 09/14/2017 3:06:00 PM Also confirmed by Raeford Razor  [phone]), editor [address] Shanda Bumps ([id number])  on 09/14/2017 4:36:24 PM       ____________________________________________  RADIOLOGY  Dg Chest Portable 1 View  Result Date: 09/14/2017 CLINICAL DATA:  Shortness of breath and chest pain EXAM: PORTABLE CHEST 1 VIEW COMPARISON:  September 07, 2017 FINDINGS: Mild cardiomegaly. The hila and mediastinum are normal. Mild interstitial prominence without overt edema. Mild bibasilar atelectasis. IMPRESSION: Cardiomegaly. Pulmonary venous congestion. Probable atelectasis in the bases. Electronically Signed   By: Gerome Sam III M.D   On: 09/14/2017 16:08    ____________________________________________   PROCEDURES  Procedure(s) performed:   .Critical Care Performed by: Maia Plan, MD Authorized by: Maia Plan, MD   Critical care provider statement:    Critical care time (minutes):  35   Critical care time was exclusive of:  Separately billable procedures and treating other patients and teaching time   Critical care was necessary to treat or prevent imminent or life-threatening deterioration of the following conditions:  Cardiac failure and  respiratory failure   Critical care was time spent personally by me on the following activities:  Blood draw for specimens, development of treatment plan with patient or surrogate, discussions with consultants, evaluation of patient's response to treatment, examination of patient, obtaining history from patient or surrogate, ordering and performing treatments and interventions, ordering and review of laboratory studies, ordering and review of radiographic studies, pulse oximetry, re-evaluation of patient's condition and review of old charts   I assumed direction of critical care for this patient from another provider in my specialty: no       ____________________________________________   INITIAL IMPRESSION / ASSESSMENT AND PLAN / ED COURSE  Pertinent labs & imaging results that were available during  my care of the patient were reviewed by me and considered in my medical decision making (see chart for details).  Patient presents to the ED with apparent COPD exacerbation. Low ACS suspicion with CP since yesterday. EKG reviewed with no acute ischemic findings. Plan for additional nebs, CXR, and reassess.   Differential includes all life-threatening causes for chest pain. This includes but is not exclusive to acute coronary syndrome, aortic dissection, pulmonary embolism, cardiac tamponade, community-acquired pneumonia, pericarditis, musculoskeletal chest wall pain, etc.  03:20 PM Re-evaluated the patient. CP improving with less SOB. Now on 2nd neb.   05:20 PM Patient is currently CP free. Spoke with Cardiology regarding the case. Recommend heparin and medicine admit for COPD mgmt and they will consult.   Discussed patient's case with Hospitalist, Dr. Benjamine MolaVann to request admission. Patient and family (if present) updated with plan. Care transferred to Hospitalist service.  I reviewed all nursing notes, vitals, pertinent old records, EKGs, labs, imaging (as available).   ____________________________________________  FINAL CLINICAL IMPRESSION(S) / ED DIAGNOSES  Final diagnoses:  COPD exacerbation (HCC)  Elevated troponin     MEDICATIONS GIVEN DURING THIS VISIT:  Medications  heparin bolus via infusion 4,000 Units (has no administration in time range)  heparin ADULT infusion 100 units/mL (25000 units/27250mL sodium chloride 0.45%) (has no administration in time range)  albuterol (PROVENTIL) (2.5 MG/3ML) 0.083% nebulizer solution (2.5 mg  Given 09/14/17 1502)  ipratropium-albuterol (DUONEB) 0.5-2.5 (3) MG/3ML nebulizer solution (3 mLs  Given 09/14/17 1502)  albuterol (PROVENTIL) (2.5 MG/3ML) 0.083% nebulizer solution (5 mg  Given 09/14/17 1510)  albuterol (PROVENTIL) (2.5 MG/3ML) 0.083% nebulizer solution 5 mg (5 mg Nebulization Given 09/14/17 1517)  diazepam (VALIUM) tablet 5 mg (5 mg Oral  Given 09/14/17 1537)  aspirin chewable tablet 324 mg (324 mg Oral Given 09/14/17 1602)  methylPREDNISolone sodium succinate (SOLU-MEDROL) 125 mg/2 mL injection 125 mg (125 mg Intravenous Given 09/14/17 1647)  nitroGLYCERIN (NITROGLYN) 2 % ointment 1 inch (1 inch Topical Given 09/14/17 1647)  acetaminophen (TYLENOL) tablet 1,000 mg (1,000 mg Oral Given 09/14/17 1647)    Note:  This document was prepared using Dragon voice recognition software and may include unintentional dictation errors.  Alona BeneJoshua Lewis Keats, MD Emergency Medicine    Tatym Schermer, Arlyss RepressJoshua G, MD 09/14/17 [phone]

## 2017-09-14 NOTE — Progress Notes (Signed)
ANTICOAGULATION CONSULT NOTE - Initial Consult  Pharmacy Consult for heparin  Indication: chest pain/ACS  Allergies  Allergen Reactions  . Ibuprofen Other (See Comments)    Pt has hx of peptic ulcer and diverticulitis.     Patient Measurements: Height: 5\' 3"  (160 cm) Weight: 190 lb (86.2 kg) IBW/kg (Calculated) : 52.4 Heparin Dosing Weight: 71.1 kg  Vital Signs: Temp: 97.5 F (36.4 C) (06/12 1503) Temp Source: Axillary (06/12 1503) BP: 136/85 (06/12 1600) Pulse Rate: 94 (06/12 1600)  Labs: Recent Labs    09/14/17 1510  HGB 12.3  HCT 37.6  PLT 207  CREATININE 1.44*  TROPONINI 0.66*    Estimated Creatinine Clearance: 33.5 mL/min (A) (by C-G formula based on SCr of 1.44 mg/dL (H)).   Medical History: Past Medical History:  Diagnosis Date  . Allergic rhinitis   . Anxiety   . CKD (chronic kidney disease) 04/04/2017  . COPD (chronic obstructive pulmonary disease) (HCC)   . Depression   . GERD (gastroesophageal reflux disease)   . Hyperlipidemia   . Hypertension   . Peptic ulcer 01/04/2003  . Vertigo     Medications:  No anticoagulants prior to admission.  Assessment:  77 y.o. Female [facility] with worsening shortness of breath.  Recently seen for 6/5 for COPD exacerbation and discharged with prednisone taper.  PMH includes COPD, CKD, angina.  Troponin 0.66 and CBC within normal limits.  Goal of Therapy:  Heparin level 0.3-0.7 units/ml Monitor platelets by anticoagulation protocol: Yes   Plan:  -heparin 4000 units bolus x 1 - heparin drip 850 units per hour - heparin level in 8 hours at 0100 - daily heparin level, CBC, and monitor for signs/symptoms of bleeding  Thank you for allowing pharmacy to be a part of this patient's care.  Harlow AsaAmber C Eulonda Andalon, PharmD 09/14/2017,4:59 PM

## 2017-09-15 ENCOUNTER — Other Ambulatory Visit: Payer: Self-pay

## 2017-09-15 ENCOUNTER — Inpatient Hospital Stay (HOSPITAL_COMMUNITY): Payer: Medicare HMO

## 2017-09-15 DIAGNOSIS — R7989 Other specified abnormal findings of blood chemistry: Secondary | ICD-10-CM

## 2017-09-15 DIAGNOSIS — R778 Other specified abnormalities of plasma proteins: Secondary | ICD-10-CM

## 2017-09-15 DIAGNOSIS — K219 Gastro-esophageal reflux disease without esophagitis: Secondary | ICD-10-CM

## 2017-09-15 DIAGNOSIS — R079 Chest pain, unspecified: Secondary | ICD-10-CM

## 2017-09-15 DIAGNOSIS — I10 Essential (primary) hypertension: Secondary | ICD-10-CM

## 2017-09-15 DIAGNOSIS — E7800 Pure hypercholesterolemia, unspecified: Secondary | ICD-10-CM

## 2017-09-15 DIAGNOSIS — R748 Abnormal levels of other serum enzymes: Secondary | ICD-10-CM

## 2017-09-15 DIAGNOSIS — R1013 Epigastric pain: Secondary | ICD-10-CM

## 2017-09-15 DIAGNOSIS — E785 Hyperlipidemia, unspecified: Secondary | ICD-10-CM

## 2017-09-15 DIAGNOSIS — I214 Non-ST elevation (NSTEMI) myocardial infarction: Principal | ICD-10-CM

## 2017-09-15 DIAGNOSIS — N183 Chronic kidney disease, stage 3 (moderate): Secondary | ICD-10-CM

## 2017-09-15 LAB — RAPID URINE DRUG SCREEN, HOSP PERFORMED
AMPHETAMINES: NOT DETECTED
Barbiturates: NOT DETECTED
Benzodiazepines: POSITIVE — AB
COCAINE: NOT DETECTED
OPIATES: POSITIVE — AB
TETRAHYDROCANNABINOL: NOT DETECTED

## 2017-09-15 LAB — BASIC METABOLIC PANEL
Anion gap: 10 (ref 5–15)
BUN: 23 mg/dL — ABNORMAL HIGH (ref 6–20)
CHLORIDE: 107 mmol/L (ref 101–111)
CO2: 21 mmol/L — ABNORMAL LOW (ref 22–32)
CREATININE: 1.39 mg/dL — AB (ref 0.44–1.00)
Calcium: 8.2 mg/dL — ABNORMAL LOW (ref 8.9–10.3)
GFR calc Af Amer: 41 mL/min — ABNORMAL LOW (ref >60)
GFR calc non Af Amer: 35 mL/min — ABNORMAL LOW (ref >60)
GLUCOSE: 142 mg/dL — AB (ref 65–99)
POTASSIUM: 5.2 mmol/L — AB (ref 3.5–5.1)
Sodium: 138 mmol/L (ref 135–145)

## 2017-09-15 LAB — CBC
HEMATOCRIT: 39.7 % (ref 36.0–46.0)
Hemoglobin: 12.3 g/dL (ref 12.0–15.0)
MCH: 27.8 pg (ref 26.0–34.0)
MCHC: 31 g/dL (ref 30.0–36.0)
MCV: 89.6 fL (ref 78.0–100.0)
Platelets: 242 10*3/uL (ref 150–400)
RBC: 4.43 MIL/uL (ref 3.87–5.11)
RDW: 15.9 % — AB (ref 11.5–15.5)
WBC: 12.7 10*3/uL — ABNORMAL HIGH (ref 4.0–10.5)

## 2017-09-15 LAB — LIPID PANEL
CHOL/HDL RATIO: 4 ratio
Cholesterol: 314 mg/dL — ABNORMAL HIGH (ref 0–200)
HDL: 78 mg/dL (ref >40)
LDL CALC: 217 mg/dL — AB (ref 0–99)
Triglycerides: 96 mg/dL (ref <150)
VLDL: 19 mg/dL (ref 0–40)

## 2017-09-15 LAB — ECHOCARDIOGRAM COMPLETE
Height: 63 in
WEIGHTICAEL: 3040 [oz_av]

## 2017-09-15 LAB — RESPIRATORY PANEL BY PCR
Adenovirus: NOT DETECTED
BORDETELLA PERTUSSIS-RVPCR: NOT DETECTED
CORONAVIRUS 229E-RVPPCR: NOT DETECTED
Chlamydophila pneumoniae: NOT DETECTED
Coronavirus HKU1: NOT DETECTED
Coronavirus NL63: NOT DETECTED
Coronavirus OC43: NOT DETECTED
INFLUENZA B-RVPPCR: NOT DETECTED
Influenza A: NOT DETECTED
METAPNEUMOVIRUS-RVPPCR: NOT DETECTED
MYCOPLASMA PNEUMONIAE-RVPPCR: NOT DETECTED
Parainfluenza Virus 1: NOT DETECTED
Parainfluenza Virus 2: NOT DETECTED
Parainfluenza Virus 3: NOT DETECTED
Parainfluenza Virus 4: NOT DETECTED
RESPIRATORY SYNCYTIAL VIRUS-RVPPCR: NOT DETECTED
Rhinovirus / Enterovirus: DETECTED — AB

## 2017-09-15 LAB — HEPARIN LEVEL (UNFRACTIONATED): Heparin Unfractionated: 0.5 IU/mL (ref 0.30–0.70)

## 2017-09-15 LAB — STREP PNEUMONIAE URINARY ANTIGEN: STREP PNEUMO URINARY ANTIGEN: NEGATIVE

## 2017-09-15 LAB — HEMOGLOBIN A1C
Hgb A1c MFr Bld: 6.1 % — ABNORMAL HIGH (ref 4.8–5.6)
Mean Plasma Glucose: 128.37 mg/dL

## 2017-09-15 LAB — MRSA PCR SCREENING: MRSA by PCR: NEGATIVE

## 2017-09-15 LAB — TROPONIN I
Troponin I: 1.6 ng/mL (ref <0.03)
Troponin I: 0.93 ng/mL (ref <0.03)

## 2017-09-15 MED ORDER — IPRATROPIUM BROMIDE 0.02 % IN SOLN: 0.5000 mg | Freq: Two times a day (BID) | RESPIRATORY_TRACT | Status: DC

## 2017-09-15 MED ORDER — ADULT MULTIVITAMIN W/MINERALS CH
1.0000 | ORAL_TABLET | Freq: Every day | ORAL | Status: DC
  Administered 2017-09-15 – 2017-09-19 (×5): 1 via ORAL
  Filled 2017-09-15 (×5): qty 1

## 2017-09-15 MED ORDER — LEVALBUTEROL HCL 1.25 MG/0.5ML IN NEBU
1.2500 mg | INHALATION_SOLUTION | Freq: Three times a day (TID) | RESPIRATORY_TRACT | Status: DC | PRN
  Administered 2017-09-16: 22:00:00 1.25 mg via RESPIRATORY_TRACT
  Filled 2017-09-15 (×2): qty 0.5

## 2017-09-15 MED ORDER — LORAZEPAM 2 MG/ML IJ SOLN
0.2500 mg | Freq: Once | INTRAMUSCULAR | Status: AC
  Administered 2017-09-15: 0.25 mg via INTRAVENOUS
  Filled 2017-09-15: qty 1

## 2017-09-15 MED ORDER — HYDROMORPHONE HCL 1 MG/ML IJ SOLN
1.0000 mg | Freq: Once | INTRAMUSCULAR | Status: AC
  Administered 2017-09-15: 1 mg via INTRAVENOUS
  Filled 2017-09-15: qty 1

## 2017-09-15 MED ORDER — IPRATROPIUM BROMIDE 0.02 % IN SOLN
0.5000 mg | Freq: Two times a day (BID) | RESPIRATORY_TRACT | Status: DC
  Administered 2017-09-15 (×2): 0.5 mg via RESPIRATORY_TRACT
  Filled 2017-09-15: qty 2.5

## 2017-09-15 MED ORDER — SODIUM CHLORIDE 0.9% FLUSH
3.0000 mL | Freq: Two times a day (BID) | INTRAVENOUS | Status: DC
  Administered 2017-09-15 – 2017-09-16 (×2): 3 mL via INTRAVENOUS

## 2017-09-15 MED ORDER — HYDROMORPHONE HCL 1 MG/ML IJ SOLN: 1.0000 mg | INTRAMUSCULAR | Status: DC | PRN

## 2017-09-15 MED ORDER — LEVALBUTEROL HCL 1.25 MG/0.5ML IN NEBU
1.2500 mg | INHALATION_SOLUTION | Freq: Two times a day (BID) | RESPIRATORY_TRACT | Status: DC
  Administered 2017-09-15 (×2): 1.25 mg via RESPIRATORY_TRACT
  Filled 2017-09-15: qty 0.5

## 2017-09-15 MED ORDER — SODIUM CHLORIDE 0.9 % IV SOLN
INTRAVENOUS | Status: DC
  Administered 2017-09-15: 23:00:00 via INTRAVENOUS

## 2017-09-15 MED ORDER — LEVALBUTEROL HCL 1.25 MG/0.5ML IN NEBU: 1.2500 mg | INHALATION_SOLUTION | Freq: Two times a day (BID) | RESPIRATORY_TRACT | Status: DC

## 2017-09-15 MED ORDER — IPRATROPIUM BROMIDE 0.02 % IN SOLN
RESPIRATORY_TRACT | Status: AC
  Filled 2017-09-15: qty 2.5

## 2017-09-15 MED ORDER — PREMIER PROTEIN SHAKE
11.0000 [oz_av] | Freq: Two times a day (BID) | ORAL | Status: DC
  Administered 2017-09-15 – 2017-09-19 (×6): 11 [oz_av] via ORAL
  Filled 2017-09-15 (×14): qty 325.31

## 2017-09-15 MED ORDER — ALPRAZOLAM 0.25 MG PO TABS
0.2500 mg | ORAL_TABLET | Freq: Three times a day (TID) | ORAL | Status: DC | PRN
  Administered 2017-09-15 (×2): 0.25 mg via ORAL
  Filled 2017-09-15 (×2): qty 1

## 2017-09-15 MED ORDER — SODIUM CHLORIDE 0.9% FLUSH: 3.0000 mL | INTRAVENOUS | Status: DC | PRN

## 2017-09-15 MED ORDER — SODIUM CHLORIDE 0.9 % IV SOLN: 250.0000 mL | INTRAVENOUS | Status: DC | PRN

## 2017-09-15 NOTE — Progress Notes (Signed)
    Echocardiogram today revealed EF 55-60%, G1DD, mildly increased PA pressures, and mild hypokinesis of the mid-apicallateral and inferolateral myocardium c/f takotsubo.   Recommend R/L heart catheterization to further investigate these findings. The patient understands that risks included but are not limited to stroke (1 in 1000), death (1 in 1000), kidney failure [usually temporary] (1 in 500), bleeding (1 in 200), allergic reaction [possibly serious] (1 in 200).  The patient understands and agrees to proceed.   Patient is currently scheduled as an add-on case for 09/16/17. Will make her NPO after MN.    Judy PimpleKrista Kroeger, PA-C

## 2017-09-15 NOTE — CV Procedure (Signed)
2D echo attempted, but patient wanted to eat. Will try later

## 2017-09-15 NOTE — Consult Note (Signed)
[facility] Consult Note   Primary Physician:  Primary Cardiologist:    Reason for Consultation: Chest pain and shortness of breath  HPI:    Veronica Wolf has a history of COPD, chronic kidney disease (stage 3), GERD, gout, anxiety, hypertension and hyperlipidemia. For the past 2 weeks she has noticed increasing shortness of breath and cough productive of mild phlegm. She denies any fevers or chills. She does not have any orthopnea or PND.   She admitted to having some left sided chest pain. It is sharp and exacerbates with deep inspiration. She also complains of lower back discomfort. The chest pain is mostly constant with no particular relieving factors.   She reports having a lot of stress at home due to family issues over the past 2-3 days.  In March 2019 the patient was evaluated for chest pain radiating to the shoulders. She underwent a cardiac catheterization procedure that revealed an EF of 55-60% and non-obstructive CAD.  Review of Systems: [y] = yes, [ ]  = no   . General: Weight gain [ ] ; Weight loss [ ] ; Anorexia [ ] ; Fatigue [ ] ; Fever [ ] ; Chills [ ] ; Weakness [Y ]  . Cardiac: Chest pain/pressure [Y ]; Resting SOB [Y ]; Exertional SOB [ ] ; Orthopnea [ ] ; Pedal Edema [ ] ; Palpitations [ ] ; Syncope [ ] ; Presyncope [ ] ; Paroxysmal nocturnal dyspnea[ ]   . Pulmonary: Cough [Y ]; Wheezing[Y ]; Hemoptysis[ ] ; Sputum [ ] ; Snoring [ ]   . GI: Vomiting[ ] ; Dysphagia[ ] ; Melena[ ] ; Hematochezia [ ] ; Heartburn[ ] ; Abdominal pain [ ] ; Constipation [ ] ; Diarrhea [ ] ; BRBPR [ ]   . GU: Hematuria[ ] ; Dysuria [ ] ; Nocturia[ ]   . Vascular: Pain in legs with walking [ ] ; Pain in feet with lying flat [ ] ; Non-healing sores [ ] ; Stroke [ ] ; TIA [ ] ; Slurred speech [ ] ;  . Neuro: Headaches[ ] ; Vertigo[ ] ; Seizures[ ] ; Paresthesias[ ] ;Blurred vision [ ] ; Diplopia [ ] ; Vision changes [ ]   . Ortho/Skin: Arthritis [ ] ; Joint pain [ ] ; Muscle pain [ ] ; Joint swelling [ ] ; Back Pain [ ] ; Rash [ ]     . Psych: Depression[ ] ; Anxiety[ ]   . Heme: Bleeding problems [ ] ; Clotting disorders [ ] ; Anemia [ ]   . Endocrine: Diabetes [ ] ; Thyroid dysfunction[ ]   Home Medications Prior to Admission medications   Medication Sig Start Date End Date Taking? Authorizing Provider  acetaminophen (TYLENOL) 500 MG tablet Take 500-1,000 mg by mouth 2 (two) times daily as needed for headache.    [provider]  albuterol (PROVENTIL) (2.5 MG/3ML) 0.083% nebulizer solution Take 3 mLs (2.5 mg total) by nebulization every 6 (six) hours as needed for wheezing or shortness of breath. Patient not taking: Reported on 05/31/2017 03/03/17   [name]'Sullivan, Melissa, NP  allopurinol (ZYLOPRIM) 100 MG tablet Take 2 tablets (200 mg total) by mouth daily. 03/23/17   [name]'Sullivan, Melissa, NP  aspirin EC 81 MG tablet Take 1 tablet (81 mg total) by mouth daily. 06/07/17   Revankar, Aundra Dubinajan R, MD  bismuth subsalicylate (PEPTO BISMOL) 262 MG/15ML suspension Take 30 mLs by mouth every 6 (six) hours as needed for indigestion or diarrhea or loose stools.    [provider]  diazepam (VALIUM) 10 MG tablet TAKE 1 TABLET BY MOUTH EVERY 12 HOURS AS NEEDED FOR ANXIETY 09/10/17   [name]'Sullivan, Melissa, NP  dicyclomine (BENTYL) 10 MG capsule Take 10 mg by mouth 3 (three) times  daily.    [provider]  escitalopram (LEXAPRO) 20 MG tablet Take 1 tablet (20 mg total) by mouth daily. 05/31/17   [name], NP  esomeprazole (NEXIUM) 20 MG capsule Take 20 mg by mouth daily as needed (GERD).    [provider]  lisinopril (PRINIVIL,ZESTRIL) 10 MG tablet Take 1 tablet (10 mg total) by mouth daily. 09/02/17 10/02/17  [name], NP  meclizine (ANTIVERT) 25 MG tablet TAKE 1 TABLET BY MOUTH THREE TIMES DAILY AS NEEDED FOR  DIZZINESS 07/27/17   [name], NP  nitroGLYCERIN (NITROSTAT) 0.4 MG SL tablet Place 1 tablet (0.4 mg total) under the tongue every 5 (five) minutes as needed. Patient taking  differently: Place 0.4 mg under the tongue every 5 (five) minutes as needed for chest pain.  06/07/17 09/05/17  Revankar, Aundra Dubin, MD  nystatin (NYSTATIN) powder Apply 1 g topically 3 (three) times daily as needed (rash).    [provider]  pantoprazole (PROTONIX) 40 MG tablet Take 1 tablet (40 mg total) by mouth daily. 05/31/17   [name], NP  predniSONE (DELTASONE) 20 MG tablet Take 2 tablets (40 mg total) by mouth daily. 09/07/17   Gwyneth Sprout, MD    Past Medical History: Past Medical History:  Diagnosis Date  . Allergic rhinitis   . Anxiety   . CKD (chronic kidney disease) 04/04/2017  . COPD (chronic obstructive pulmonary disease) (HCC)   . Depression   . GERD (gastroesophageal reflux disease)   . Hyperlipidemia   . Hypertension   . Peptic ulcer 01/04/2003  . Vertigo     Past Surgical History: Past Surgical History:  Procedure Laterality Date  . ABDOMINAL HYSTERECTOMY    . LEFT HEART CATH AND CORONARY ANGIOGRAPHY N/A 06/13/2017   Procedure: LEFT HEART CATH AND CORONARY ANGIOGRAPHY;  Surgeon: Marykay Lex, MD;  Location[facility] INVASIVE CV LAB;  Service: Cardiovascular;  Laterality: N/A;    Family History: History reviewed. No pertinent family history.  Social History: Previous smoker however quit many years ago.  Social History   Socioeconomic History  . Marital status: Widowed    Spouse name: Not on file  . Number of children: Not on file  . Years of education: Not on file  . Highest education level: Not on file  Occupational History  . Not on file  Social Needs  . Financial resource strain: Not on file  . Food insecurity:    Worry: Not on file    Inability: Not on file  . Transportation needs:    Medical: Not on file    Non-medical: Not on file  Tobacco Use  . Smoking status: Former Games developer  . Smokeless tobacco: Never Used  Substance and Sexual Activity  . Alcohol use: No    Comment: has previous hx of ETOH abuse, quit 2014  . Drug  use: No  . Sexual activity: Not on file  Lifestyle  . Physical activity:    Days per week: Not on file    Minutes per session: Not on file  . Stress: Not on file  Relationships  . Social connections:    Talks on phone: Not on file    Gets together: Not on file    Attends religious service: Not on file    Active member of club or organization: Not on file    Attends meetings of clubs or organizations: Not on file    Relationship status: Not on file  Other Topics Concern  . 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 Ibuprofen Other (See Comments)    Pt has hx of peptic ulcer and diverticulitis.     Objective:    Vital Signs:   Temp:  [97.5 F (36.4 C)-98.7 F (37.1 C)] 97.8 F (36.6 C) (06/13 0321) Pulse Rate:  [86-108] 95 (06/13 0321) Resp:  [16-28] 20 (06/13 0321) BP: (113-153)/(79-96) 113/86 (06/13 0321) SpO2:  [91 %-100 %] 95 % (06/13 0321) Weight:  [86.2 kg (190 lb)] 86.2 kg (190 lb) (06/12 1503)    Weight change: Filed Weights   09/14/17 1503  Weight: 86.2 kg (190 lb)    Intake/Output:  No intake or output data in the 24 hours ending 09/15/17 0326    Physical Exam    General:  Well appearing. No resp difficulty HEENT: normal Neck: supple. JVP . Carotids 2+ bilat; no bruits. No lymphadenopathy or thyromegaly appreciated. Cor: PMI nondisplaced. Regular rate & rhythm. No rubs, gallops or murmurs. Lungs: clear Abdomen: soft, nontender, nondistended. No hepatosplenomegaly. No bruits or masses. Good bowel sounds. Extremities: no cyanosis, clubbing, rash, edema Neuro: alert & orientedx3, cranial nerves grossly intact. moves all 4 extremities w/o difficulty. Affect pleasant     EKG    Poor R wave progression in the anterior leads. Non-specific T wave changes  Labs   Basic Metabolic Panel: Recent Labs  Lab  09/14/17 1510  NA 141  K 3.2*  CL 105  CO2 25  GLUCOSE 105*  BUN 24*  CREATININE 1.44*  CALCIUM 8.6*    Liver Function Tests: Recent Labs  Lab 09/14/17 1510  AST 20  ALT 15  ALKPHOS 62  BILITOT 0.4  PROT 7.3  ALBUMIN 3.9   Recent Labs  Lab 09/14/17 2152  LIPASE 23   No results for input(s): AMMONIA in the last 168 hours.  CBC: Recent Labs  Lab 09/14/17 1510  WBC 15.9*  NEUTROABS 10.4*  HGB 12.3  HCT 37.6  MCV 87.4  PLT 207    Cardiac Enzymes: Recent Labs  Lab 09/14/17 1510 09/14/17 2152  TROPONINI 0.66* 1.76*    BNP: BNP (last 3 results) Recent Labs    09/14/17 1510  BNP 162.0*    ProBNP (last 3 results) No results for input(s): PROBNP in the last 8760 hours.   CBG: No results for input(s): GLUCAP in the last 168 hours.  Coagulation Studies: No results for input(s): LABPROT, INR in the last 72 hours.   Imaging   Dg Chest Portable 1 View  Result Date: 09/14/2017 CLINICAL DATA:  Shortness of breath and chest pain EXAM: PORTABLE CHEST 1 VIEW COMPARISON:  September 07, 2017 FINDINGS: Mild cardiomegaly. The hila and mediastinum are normal. Mild interstitial prominence without overt edema. Mild bibasilar atelectasis. IMPRESSION: Cardiomegaly. Pulmonary venous congestion. Probable atelectasis in the bases. Electronically Signed   By: Gerome Sam III M.D   On: 09/14/2017 16:08      Medications:     Current Medications: . allopurinol  200 mg Oral Daily  . aspirin EC  81 mg Oral Daily  . atorvastatin  80 mg Oral q1800  . azithromycin  250 mg Oral Daily  . dicyclomine  10 mg Oral TID  . escitalopram  20 mg Oral Daily  . ipratropium      . ipratropium  0.5 mg Nebulization Q4H  .  levalbuterol  1.25 mg Nebulization Q6H  . lisinopril  10 mg Oral Daily  . methylPREDNISolone (SOLU-MEDROL) injection  40 mg Intravenous TID  . pantoprazole  40 mg Intravenous Q12H     Infusions: . sodium chloride 75 mL/hr at 09/14/17 2236  . heparin 850  Units/hr (09/14/17 1735)  . nitroGLYCERIN 60 mcg/min (09/15/17 0128)        Assessment/Plan    1. NSTEMI   The patient has had chest pain for the past 24 hours. It has both typical and atypical features. She admitted to some pleuritic component to her pain on my inquiry. The pain increases on deep inspiration. The ECG on reveals non-specific T changes. There are no ST elevations or depressions.  She has had a steady increase in her troponins. The likely mechanism for this is demand ischemia. It is recommended that her cardiac enzymes be cycled till the Troponin peaks. The IV heparin can be continued till then. Please continue the ASA and high-dose statins.  She needs a repeat transthoracic echo to evaluate the LV function. She admits to significant stress. It is important to rule out a stress cardiomypathy.   2. COPD Exacerbation  Continue respiratory treatments and steroids. Will defer to primary team for further management  3. Chronic kidney disease  Monitor serum creatinine and GFR. Okay to continue the lisinopril for now.  4. Hypertension  Continue the ACE inhibitor  5. Gout  Continue current management    Lonie Peak, MD  09/15/2017, 3:26 AM   Please contact [facility] for night-coverage after hours (4p -7a ) and weekends on [email or web address]

## 2017-09-15 NOTE — Progress Notes (Signed)
Initial Nutrition Assessment  DOCUMENTATION CODES:   Obesity unspecified  INTERVENTION:  Premier Protein BID, each supplement provides 160 calories and 30 grams of protein  Multivitamin with Minerals  NUTRITION DIAGNOSIS:   Inadequate oral intake related to acute illness as evidenced by per patient/family report.  GOAL:   Patient will meet greater than or equal to 90% of their needs  MONITOR:   PO intake, Supplement acceptance, Weight trends  REASON FOR ASSESSMENT:   Malnutrition Screening Tool    ASSESSMENT:   Patient has a past medical history significant of hypertension, hyperlipidemia, COPD, GERD, gout, depression with anxiety, CKD 3, peptic ulcer disease, who presents with chest pain, NSTMEI, COPD Exacerbation, and Epigastric abdominal pain  RD drawn to patient for positive MST. Spoke with patient at bedside. She reports poor appetite for the past 2-3 days related to chest pain and SOB. Prior to that her normal PO intake consisted of:  Oatmeal and Coffee for breakfast Tuna sandwich or Peanut Butter and Jelly Sandwich for [employer]unch Salad with chicken and a sweet potato for dinner Snacks on sweets throughout the day.  Patient reports 20 pound weight loss over the past 3 months, but per chart her weight has been stable. She was eating a chicken salad sandwich during RD visit, patient is very hungry, states she hadn't eaten anything since 6/11  Will monitor PO intake and needs.  NUTRITION - FOCUSED PHYSICAL EXAM:    Most Recent Value  Orbital Region  No depletion  Upper Arm Region  No depletion  Thoracic and Lumbar Region  No depletion  Buccal Region  No depletion  Temple Region  No depletion  Clavicle Bone Region  No depletion  Clavicle and Acromion Bone Region  No depletion  Scapular Bone Region  No depletion  Dorsal Hand  No depletion  Patellar Region  No depletion  Anterior Thigh Region  No depletion  Posterior Calf Region  No depletion       Diet  Order:   Diet Order           Diet Heart Room service appropriate? Yes; Fluid consistency: Thin  Diet effective now          EDUCATION NEEDS:   Not appropriate for education at this time  Skin:  Skin Assessment: Reviewed RN Assessment  Last BM:  09/14/2017  Height:   Ht Readings from Last 1 Encounters:  09/14/17 5\' 3"  (1.6 m)    Weight:   Wt Readings from Last 1 Encounters:  09/14/17 190 lb (86.2 kg)    Ideal Body Weight:  52.27 kg  BMI:  Body mass index is 33.66 kg/m.  Estimated Nutritional Needs:   Kcal:  [id number] calories (MSJ x1.2-1.4)  Protein:  100-120 grams  Fluid:  per MD    Dionne AnoWilliam M. Adalin Vanderploeg, MS, RD LDN Inpatient Clinical Dietitian Pager [phone]

## 2017-09-15 NOTE — Progress Notes (Signed)
PROGRESS NOTE    Veronica Wolf  ONG:[id number] DOB: 12/23/1940 DOA: 09/14/2017 PCP: [name]'Sullivan, Melissa, NP   Brief Narrative:  HPI 09/14/2017 by Dr. Lorretta HarpXilin Niu Veronica Wolf is a 77 y.o. female with medical history significant of hypertension, hyperlipidemia, COPD, GERD, gout, depression with anxiety, CKD 3, peptic ulcer disease, who presents with chest pain, shortness of breath, cough, epigastric abdominal pain.  Patient states that she started having cough and shortness of breath this afternoon, which has been progressively getting worse.  She has a little mucus production.  No fever or chills.  She also reports chest pain at the same times.  The chest pain is located in the substernal area, constant, 8 out of 10 severity, pressure-like, nonradiating.  It is not pleuritic, not aggravated by deep breath or coughing.  No tenderness in the calf areas.  No recent long distance traveling.  Patient did not have abdominal pain at home, but she started having epigastric abdominal pain in the hospital.  It is constant, 8 out of 10 severity, nonradiating.  Not associated with nausea vomiting or diarrhea.  No hematochezia or hematemesis.  Patient denies symptoms of UTI or unilateral weakness.  Patient was found to have an elevated troponin 0 0.66.  The ED physician discussed with on-call cardiologist (they did not write down cardiologist/s name), who recommended to start patient with IV heparin. Pt states that she has a lot stress due to family issues recently.  Assessment & Plan   Chest pain/NSTEMI -Troponin 0.66 on admission, peaked at 1.76 -patient had persistent Chest pain vs demand ischemia -Cardiology consulted and appreciated -Placed on IV heparin -continue lisinopril, statin, aspirin -Echocardiogram ordered  COPD exacerbation secondary to rhinovirus -Chest x-ray: Unremarkable for infection -Continue steroids, nebulizer treatments, azithromycin, Mucinex -Respiratory viral panel positive for  rhinovirus/enterovirus -Pending blood cultures -strep pneumonia urine antigen negative -Continue supplemental oxygen to maintain saturations above 92%  Epigastric abdominal pain/GERD/peptic ulcer disease -Continue Protonix  Hyperlipidemia -Continue statin  Gout -Continue allopurinol  Chronic kidney disease, stage III -Creatinine 1.2-1.4, currently 1.44 -Continue to monitor BMP  Essential hypertension -Continue lisinopril  Hypokalemia -potassium 3.2, replaced -continue to monitor BMP  Depression/anxiety -Continue Lexapro, Valium, Xanax as needed  DVT Prophylaxis  heparin  Code Status: Full  Family Communication: none at bedside  Disposition Plan: Admitted, pending further workup. Home when stable  Consultants Cardiology  Procedures  None  Antibiotics   Anti-infectives (From admission, onward)   Start     Dose/Rate Route Frequency Ordered Stop   09/15/17 1000  azithromycin (ZITHROMAX) tablet 250 mg     250 mg Oral Daily 09/14/17 2112 09/19/17 0959   09/14/17 2115  azithromycin (ZITHROMAX) tablet 500 mg     500 mg Oral Daily 09/14/17 2112 09/14/17 2233      Subjective:   [name] seen and examined today.  Patient states she is feeling mildly better. Feels her breathing has improved but not back to her normal.  No longer complaining of chest pain.  Denies current abdominal pain, nausea or vomiting, diarrhea or constipation, dizziness or headache.  Objective:   Vitals:   09/15/17 0503 09/15/17 0807 09/15/17 0838 09/15/17 0855  BP:  131/80 131/80   Pulse:  97 91   Resp:  18 16   Temp:  (!) 97.5 F (36.4 C)    TempSrc:  Oral    SpO2: 97% 98% 99% 99%  Weight:      Height:        Intake/Output  Summary (Last 24 hours) at 09/15/2017 1232 Last data filed at 09/15/2017 0400 Gross per 24 hour  Intake 564.67 ml  Output 150 ml  Net 414.67 ml   Filed Weights   09/14/17 1503  Weight: 86.2 kg (190 lb)    Exam  General: Well developed, well nourished,  NAD, appears stated age  HEENT: NCAT, PERRLA, EOMI, Anicteic Sclera, mucous membranes moist.   Neck: Supple  Cardiovascular: S1 S2 auscultated, no rubs, murmurs or gallops. Regular rate and rhythm.  Respiratory: Clear to auscultation bilaterally with equal chest rise  Abdomen: Soft, obese, nontender, nondistended, + bowel sounds  Extremities: warm dry without cyanosis clubbing or edema  Neuro: AAOx3, cranial nerves grossly intact. Strength 5/5 in patient's upper and lower extremities bilaterally  Skin: Without rashes exudates or nodules  Psych: Normal affect and demeanor with intact judgement and insight   Data Reviewed: I have personally reviewed following labs and imaging studies  CBC: Recent Labs  Lab 09/14/17 1510 09/15/17 0320  WBC 15.9* 12.7*  NEUTROABS 10.4*  --   HGB 12.3 12.3  HCT 37.6 39.7  MCV 87.4 89.6  PLT 207 242   Basic Metabolic Panel: Recent Labs  Lab 09/14/17 1510  NA 141  K 3.2*  CL 105  CO2 25  GLUCOSE 105*  BUN 24*  CREATININE 1.44*  CALCIUM 8.6*   GFR: Estimated Creatinine Clearance: 33.5 mL/min (A) (by C-G formula based on SCr of 1.44 mg/dL (H)). Liver Function Tests: Recent Labs  Lab 09/14/17 1510  AST 20  ALT 15  ALKPHOS 62  BILITOT 0.4  PROT 7.3  ALBUMIN 3.9   Recent Labs  Lab 09/14/17 2152  LIPASE 23   No results for input(s): AMMONIA in the last 168 hours. Coagulation Profile: No results for input(s): INR, PROTIME in the last 168 hours. Cardiac Enzymes: Recent Labs  Lab 09/14/17 1510 09/14/17 2152 09/15/17 0320  TROPONINI 0.66* 1.76* 1.60*   BNP (last 3 results) No results for input(s): PROBNP in the last 8760 hours. HbA1C: Recent Labs    09/15/17 0320  HGBA1C 6.1*   CBG: No results for input(s): GLUCAP in the last 168 hours. Lipid Profile: Recent Labs    09/15/17 0320  CHOL 314*  HDL 78  LDLCALC 217*  TRIG 96  CHOLHDL 4.0   Thyroid Function Tests: No results for input(s): TSH, T4TOTAL,  FREET4, T3FREE, THYROIDAB in the last 72 hours. Anemia Panel: No results for input(s): VITAMINB12, FOLATE, FERRITIN, TIBC, IRON, RETICCTPCT in the last 72 hours. Urine analysis:    Component Value Date/Time   COLORURINE STRAW (A) 04/05/2017 0910   APPEARANCEUR CLEAR 04/05/2017 0910   LABSPEC 1.010 04/05/2017 0910   PHURINE 6.0 04/05/2017 0910   GLUCOSEU NEGATIVE 04/05/2017 0910   HGBUR NEGATIVE 04/05/2017 0910   BILIRUBINUR NEGATIVE 04/05/2017 0910   KETONESUR NEGATIVE 04/05/2017 0910   PROTEINUR NEGATIVE 04/05/2017 0910   NITRITE NEGATIVE 04/05/2017 0910   LEUKOCYTESUR NEGATIVE 04/05/2017 0910   Sepsis Labs: @LABRCNTIP (procalcitonin:4,lacticidven:4)  ) Recent Results (from the past 240 hour(s))  Respiratory Panel by PCR     Status: Abnormal   Collection Time: 09/14/17 10:41 PM  Result Value Ref Range Status   Adenovirus NOT DETECTED NOT DETECTED Final   Coronavirus 229E NOT DETECTED NOT DETECTED Final   Coronavirus HKU1 NOT DETECTED NOT DETECTED Final   Coronavirus NL63 NOT DETECTED NOT DETECTED Final   Coronavirus OC43 NOT DETECTED NOT DETECTED Final   Metapneumovirus NOT DETECTED NOT DETECTED Final   Rhinovirus /  Enterovirus DETECTED (A) NOT DETECTED Final   Influenza A NOT DETECTED NOT DETECTED Final   Influenza B NOT DETECTED NOT DETECTED Final   Parainfluenza Virus 1 NOT DETECTED NOT DETECTED Final   Parainfluenza Virus 2 NOT DETECTED NOT DETECTED Final   Parainfluenza Virus 3 NOT DETECTED NOT DETECTED Final   Parainfluenza Virus 4 NOT DETECTED NOT DETECTED Final   Respiratory Syncytial Virus NOT DETECTED NOT DETECTED Final   Bordetella pertussis NOT DETECTED NOT DETECTED Final   Chlamydophila pneumoniae NOT DETECTED NOT DETECTED Final   Mycoplasma pneumoniae NOT DETECTED NOT DETECTED Final    Comment: Performed at [facility], [address], [place], [place] [address]  MRSA PCR Screening     Status: None   Collection Time: 09/14/17 11:55 PM  Result Value  Ref Range Status   MRSA by PCR NEGATIVE NEGATIVE Final    Comment:        The GeneXpert MRSA Assay (FDA approved for NASAL specimens only), is one component of a comprehensive MRSA colonization surveillance program. It is not intended to diagnose MRSA infection nor to guide or monitor treatment for MRSA infections. Performed at [facility], [address] [place], [place] [address]       Radiology Studies: Dg Chest Portable 1 View  Result Date: 09/14/2017 CLINICAL DATA:  Shortness of breath and chest pain EXAM: PORTABLE CHEST 1 VIEW COMPARISON:  September 07, 2017 FINDINGS: Mild cardiomegaly. The hila and mediastinum are normal. Mild interstitial prominence without overt edema. Mild bibasilar atelectasis. IMPRESSION: Cardiomegaly. Pulmonary venous congestion. Probable atelectasis in the bases. Electronically Signed   By: Gerome Sam III M.D   On: 09/14/2017 16:08     Scheduled Meds: . allopurinol  200 mg Oral Daily  . aspirin EC  81 mg Oral Daily  . atorvastatin  80 mg Oral q1800  . azithromycin  250 mg Oral Daily  . dicyclomine  10 mg Oral TID  . escitalopram  20 mg Oral Daily  . ipratropium  0.5 mg Nebulization BID  . levalbuterol  1.25 mg Nebulization BID  . lisinopril  10 mg Oral Daily  . methylPREDNISolone (SOLU-MEDROL) injection  40 mg Intravenous TID  . pantoprazole  40 mg Intravenous Q12H   Continuous Infusions: . sodium chloride 75 mL/hr at 09/15/17 1226  . heparin 850 Units/hr (09/14/17 1735)  . nitroGLYCERIN 60 mcg/min (09/15/17 0128)     LOS: 1 day   Time Spent in minutes   45 minutes  Taray Normoyle D.O. on 09/15/2017 at 12:32 PM  Between 7am to 7pm - Pager - [phone]  After 7pm go to [email or web address] - password [email or web address]  And look for the night coverage person covering for me after hours  [facility] Office  [phone]

## 2017-09-15 NOTE — Progress Notes (Signed)
  Echocardiogram 2D Echocardiogram has been performed.  Janalyn HarderWest, Shayleen Eppinger R 09/15/2017, 2:34 PM

## 2017-09-15 NOTE — Progress Notes (Addendum)
Progress Note  Patient Name: Veronica Wolf Date of Encounter: 09/15/2017  Primary Cardiologist: No primary care provider on file.   Subjective   He denies any chest pain or shortness of breath  Inpatient Medications    Scheduled Meds: . allopurinol  200 mg Oral Daily  . aspirin EC  81 mg Oral Daily  . atorvastatin  80 mg Oral q1800  . azithromycin  250 mg Oral Daily  . dicyclomine  10 mg Oral TID  . escitalopram  20 mg Oral Daily  . ipratropium  0.5 mg Nebulization BID  . levalbuterol  1.25 mg Nebulization BID  . lisinopril  10 mg Oral Daily  . methylPREDNISolone (SOLU-MEDROL) injection  40 mg Intravenous TID  . multivitamin with minerals  1 tablet Oral Daily  . pantoprazole  40 mg Intravenous Q12H  . protein supplement shake  11 oz Oral BID BM   Continuous Infusions: . sodium chloride 75 mL/hr at 09/15/17 1226  . heparin 850 Units/hr (09/14/17 1735)  . nitroGLYCERIN 60 mcg/min (09/15/17 1534)   PRN Meds: acetaminophen, ALPRAZolam, alum & mag hydroxide-simeth, bismuth subsalicylate, dextromethorphan-guaiFENesin, diazepam, hydrALAZINE, meclizine, morphine injection, nitroGLYCERIN, nystatin, ondansetron (ZOFRAN) IV, zolpidem   Vital Signs    Vitals:   09/15/17 0807 09/15/17 0838 09/15/17 0855 09/15/17 1241  BP: 131/80 131/80  (!) 113/57  Pulse: 97 91    Resp: 18 16  17   Temp: (!) 97.5 F (36.4 C)   98.2 F (36.8 C)  TempSrc: Oral   Oral  SpO2: 98% 99% 99% 93%  Weight:      Height:        Intake/Output Summary (Last 24 hours) at 09/15/2017 1642 Last data filed at 09/15/2017 0400 Gross per 24 hour  Intake 564.67 ml  Output 150 ml  Net 414.67 ml   Filed Weights   09/14/17 1503  Weight: 190 lb (86.2 kg)    Telemetry    Sinus rhythm- Personally Reviewed  ECG    Normal sinus rhythm with diffuse ST-T wave abnormality with deeply inverted T waves throughout the EKG most likely consistent with stress-induced cardia myopathy- Personally Reviewed  Physical  Exam   GEN: No acute distress.   Neck: No JVD Cardiac: RRR, no murmurs, rubs, or gallops.  Respiratory: Clear to auscultation bilaterally. GI: Soft, nontender, non-distended  MS: No edema; No deformity. Neuro:  Nonfocal  Psych: Normal affect   Labs    Chemistry Recent Labs  Lab 09/14/17 1510 09/15/17 1220  NA 141 138  K 3.2* 5.2*  CL 105 107  CO2 25 21*  GLUCOSE 105* 142*  BUN 24* 23*  CREATININE 1.44* 1.39*  CALCIUM 8.6* 8.2*  PROT 7.3  --   ALBUMIN 3.9  --   AST 20  --   ALT 15  --   ALKPHOS 62  --   BILITOT 0.4  --   GFRNONAA 34* 35*  GFRAA 39* 41*  ANIONGAP 11 10     Hematology Recent Labs  Lab 09/14/17 1510 09/15/17 0320  WBC 15.9* 12.7*  RBC 4.30 4.43  HGB 12.3 12.3  HCT 37.6 39.7  MCV 87.4 89.6  MCH 28.6 27.8  MCHC 32.7 31.0  RDW 16.1* 15.9*  PLT 207 242    Cardiac Enzymes Recent Labs  Lab 09/14/17 1510 09/14/17 2152 09/15/17 0320 09/15/17 1220  TROPONINI 0.66* 1.76* 1.60* 0.93*   No results for input(s): TROPIPOC in the last 168 hours.   BNP Recent Labs  Lab 09/14/17 1510  BNP 162.0*     DDimer No results for input(s): DDIMER in the last 168 hours.   Radiology    Dg Chest Portable 1 View  Result Date: 09/14/2017 CLINICAL DATA:  Shortness of breath and chest pain EXAM: PORTABLE CHEST 1 VIEW COMPARISON:  September 07, 2017 FINDINGS: Mild cardiomegaly. The hila and mediastinum are normal. Mild interstitial prominence without overt edema. Mild bibasilar atelectasis. IMPRESSION: Cardiomegaly. Pulmonary venous congestion. Probable atelectasis in the bases. Electronically Signed   By: Gerome Samavid  Williams III M.D   On: 09/14/2017 16:08    Cardiac Studies   2D echo 09/15/2017 Study Conclusions  - Left ventricle: The cavity size was normal. Wall thickness was   normal. Systolic function was normal. The estimated ejection   fraction was in the range of 55% to 60%. Mild hypokinesis of the   mid-apicallateral and inferolateral myocardium.  Doppler   parameters are consistent with abnormal left ventricular   relaxation (grade 1 diastolic dysfunction). - Pulmonary arteries: Systolic pressure was moderately increased.   PA peak pressure: 62 mm Hg (S).  Cardiac Cath 06/13/2017 Conclusion     The left ventricular systolic function is normal. The Ejection Ffraction (EF) is 55-65% by visual estimate.  LV end diastolic pressure is normal.  Prox RCA lesion is 20% stenosed. Post Atrio lesion is 20% stenosed.  Otherwise essentially angiographically normal coronary arteries.  Occluded right axillary artery   Angiographically normal coronary arteries. Occluded right axillary artery  Suspect non-anginal chest pain  Plan: The patient will be transferred back to short stay holding after sheath is removed in the PACU.  She will be discharged today after bed rest. Follow-up with Dr. Tomie Chinaevankar  Would consider referral to vascular cardiologist (Dr. Kirke CorinArida or Dr[facility] Allyson [facility]) to evaluate the occluded subclavian artery if there are symptoms of right arm claudication.      Patient Profile     77 y.o. female COPD, chronic kidney disease (stage 3), GERD, gout, anxiety, hypertension and hyperlipidemia. For the past 2 weeks she has noticed increasing shortness of breath and cough productive of mild phlegm. She denies any fevers or chills. She does not have any orthopnea or PND.  She admitted to having some left sided chest pain. It is sharp and exacerbates with deep inspiration. She also complains of lower back discomfort. The chest pain is mostly constant with no particular relieving factors. She reports having a lot of stress at home due to family issues over the past 2-3 days.In March 2019 the patient was evaluated for chest pain radiating to the shoulders. She underwent a cardiac catheterization procedure that revealed an EF of 55-60% and non-obstructive CAD.  Assessment & Plan    1.  NSTEMI -Troponin  at 1.76 and is trending downward. -EKG on admission was normal but now EKG shows diffuse T wave inversions inferiorly and anterior laterally worrisome for stress-induced MI -2D echocardiogram shows overall normal LV systolic function with EF 55 to 60% with mild hypokinesis of the mid to apical lateral and inferior lateral myocardium. -Had a cardiac catheterization done 06/13/2017 showing 20% proximal RCA and 20% posterior atrial branch lesion otherwise normal coronary arteries with an occluded right axillary artery. -Suspect this is a stress-induced MI but with troponin bump and new wall motion of normality she will need to undergo cardiac catheterization sure she does not have an acute thrombotic lesion.   -Continue aspirin, IV nitroglycerin drip, IV heparin drip per pharmacy high-dose statin -Start low-dose Lopressor 12.5  mg twice daily -N.p.o. after midnight for cardiac catheterization in the a.m.  2.  Moderate pulmonary hypertension 2D echocardiogram -PASP 63 mmHg -is a new finding compared to echo 12/23/2016. -Pulmonary hypertension may be due to COPD in the setting of acute exacerbation  3.  Minimal nonobstructive CAD by cath March 2019 with 20% proximal RCA -Continue aspirin 81 mg daily, high-dose Lipitor 80 mg daily  4.  Hyperlipidemia with LDL goal less than 70. -LDL 217 on 09/15/2017 and now on high-dose Lipitor  5.  Possible COPD exacerbation -Per [facility]  6.  Chronic kidney disease stage III -Creatinine and 1.39 today. -ACE inhibitor  7.  Hypertension -Holding ACE inhibitor and starting beta-blocker  I have spent a total of 40 minutes with patient reviewing 2D echo, hospital notes, prior cath and echo , telemetry, EKGs, labs and examining patient as well as establishing an assessment and plan that was discussed with the patient.  > 50% of time was spent in direct patient care.    For questions or updates, please contact [facility] Please consult [email or web address] for contact info  under Cardiology/STEMI.      Signed, Armanda Magic, MD  09/15/2017, 4:42 PM

## 2017-09-15 NOTE — Progress Notes (Signed)
ANTICOAGULATION CONSULT NOTE - Initial Consult  Pharmacy Consult for heparin  Indication: chest pain/ACS  Allergies  Allergen Reactions  . Ibuprofen Other (See Comments)    Pt has hx of peptic ulcer and diverticulitis.     Patient Measurements: Height: 5\' 3"  (160 cm) Weight: 190 lb (86.2 kg) IBW/kg (Calculated) : 52.4 Heparin Dosing Weight: 71.1 kg  Vital Signs: Temp: 97.5 F (36.4 C) (06/13 0807) Temp Source: Oral (06/13 0807) BP: 131/80 (06/13 0838) Pulse Rate: 91 (06/13 0838)  Labs: Recent Labs    09/14/17 1510 09/14/17 2152 09/15/17 0320 09/15/17 0858  HGB 12.3  --  12.3  --   HCT 37.6  --  39.7  --   PLT 207  --  242  --   HEPARINUNFRC  --   --   --  0.50  CREATININE 1.44*  --   --   --   TROPONINI 0.66* 1.76* 1.60*  --     Estimated Creatinine Clearance: 33.5 mL/min (A) (by C-G formula based on SCr of 1.44 mg/dL (H)).   Medical History: Past Medical History:  Diagnosis Date  . Allergic rhinitis   . Anxiety   . CKD (chronic kidney disease) 04/04/2017  . COPD (chronic obstructive pulmonary disease) (HCC)   . Depression   . GERD (gastroesophageal reflux disease)   . Hyperlipidemia   . Hypertension   . Peptic ulcer 01/04/2003  . Vertigo     Medications:  No anticoagulants prior to admission.  Assessment: 77 y.o. Female on heparin gtt for ACS. Last heparin level therapeutic at 0.5. Troponin elevated at 1.6. Hgb 12.3, plts wnl.  Goal of Therapy:  Heparin level 0.3-0.7 units/ml Monitor platelets by anticoagulation protocol: Yes   Plan:  Continue heparin gtt at 850 units per hour Monitor daily heparin level, CBC, s/s of bleed F/U cards recs  Thank you for allowing pharmacy to be a part of this patient's care.  Enzo BiNathan Lincoln Kleiner, PharmD, BCPS Clinical Pharmacist Phone number [phone] 09/15/2017 12:29 PM

## 2017-09-16 ENCOUNTER — Inpatient Hospital Stay (HOSPITAL_COMMUNITY)
Admission: EM | Disposition: A | Discharge status: Home / Self Care | Payer: Self-pay | Source: Home / Self Care | Attending: Internal Medicine

## 2017-09-16 DIAGNOSIS — R079 Chest pain, unspecified: Secondary | ICD-10-CM

## 2017-09-16 HISTORY — PX: RIGHT/LEFT HEART CATH AND CORONARY ANGIOGRAPHY: CATH118266

## 2017-09-16 HISTORY — PX: ULTRASOUND GUIDANCE FOR VASCULAR ACCESS: SHX6516

## 2017-09-16 LAB — POCT I-STAT 3, ART BLOOD GAS (G3+)
Acid-base deficit: 5 mmol/L — ABNORMAL HIGH (ref 0.0–2.0)
BICARBONATE: 19.8 mmol/L — AB (ref 20.0–28.0)
O2 Saturation: 98 %
PCO2 ART: 36.5 mmHg (ref 32.0–48.0)
TCO2: 21 mmol/L — ABNORMAL LOW (ref 22–32)
pH, Arterial: 7.343 — ABNORMAL LOW (ref 7.350–7.450)
pO2, Arterial: 116 mmHg — ABNORMAL HIGH (ref 83.0–108.0)

## 2017-09-16 LAB — POCT I-STAT 3, VENOUS BLOOD GAS (G3P V)
ACID-BASE DEFICIT: 5 mmol/L — AB (ref 0.0–2.0)
BICARBONATE: 21 mmol/L (ref 20.0–28.0)
O2 Saturation: 64 %
PCO2 VEN: 41.2 mmHg — AB (ref 44.0–60.0)
PH VEN: 7.315 (ref 7.250–7.430)
PO2 VEN: 36 mmHg (ref 32.0–45.0)
TCO2: 22 mmol/L (ref 22–32)

## 2017-09-16 LAB — BASIC METABOLIC PANEL
Anion gap: 4 — ABNORMAL LOW (ref 5–15)
Anion gap: 9 (ref 5–15)
BUN: 38 mg/dL — AB (ref 6–20)
BUN: 35 mg/dL — AB (ref 6–20)
CHLORIDE: 110 mmol/L (ref 101–111)
CO2: 22 mmol/L (ref 22–32)
CO2: 20 mmol/L — AB (ref 22–32)
CREATININE: 1.48 mg/dL — AB (ref 0.44–1.00)
Calcium: 7.9 mg/dL — ABNORMAL LOW (ref 8.9–10.3)
Calcium: 8.3 mg/dL — ABNORMAL LOW (ref 8.9–10.3)
Chloride: 111 mmol/L (ref 101–111)
Creatinine, Ser: 1.74 mg/dL — ABNORMAL HIGH (ref 0.44–1.00)
GFR calc Af Amer: 31 mL/min — ABNORMAL LOW (ref >60)
GFR calc non Af Amer: 33 mL/min — ABNORMAL LOW (ref >60)
GFR, EST AFRICAN AMERICAN: 38 mL/min — AB (ref >60)
GFR, EST NON AFRICAN AMERICAN: 27 mL/min — AB (ref >60)
GLUCOSE: 117 mg/dL — AB (ref 65–99)
Glucose, Bld: 103 mg/dL — ABNORMAL HIGH (ref 65–99)
POTASSIUM: 5.3 mmol/L — AB (ref 3.5–5.1)
Potassium: 4.8 mmol/L (ref 3.5–5.1)
SODIUM: 140 mmol/L (ref 135–145)
Sodium: 136 mmol/L (ref 135–145)

## 2017-09-16 LAB — CBC
HCT: 33.3 % — ABNORMAL LOW (ref 36.0–46.0)
HEMOGLOBIN: 10.2 g/dL — AB (ref 12.0–15.0)
MCH: 27.7 pg (ref 26.0–34.0)
MCHC: 30.6 g/dL (ref 30.0–36.0)
MCV: 90.5 fL (ref 78.0–100.0)
Platelets: 211 10*3/uL (ref 150–400)
RBC: 3.68 MIL/uL — ABNORMAL LOW (ref 3.87–5.11)
RDW: 16.4 % — ABNORMAL HIGH (ref 11.5–15.5)
WBC: 15.4 10*3/uL — ABNORMAL HIGH (ref 4.0–10.5)

## 2017-09-16 LAB — PROTIME-INR
INR: 1.11
Prothrombin Time: 14.2 seconds (ref 11.4–15.2)

## 2017-09-16 LAB — HEPARIN LEVEL (UNFRACTIONATED): HEPARIN UNFRACTIONATED: 0.54 [IU]/mL (ref 0.30–0.70)

## 2017-09-16 LAB — POCT ACTIVATED CLOTTING TIME: ACTIVATED CLOTTING TIME: 142 s

## 2017-09-16 SURGERY — RIGHT/LEFT HEART CATH AND CORONARY ANGIOGRAPHY
Anesthesia: LOCAL

## 2017-09-16 MED ORDER — SODIUM CHLORIDE 0.9 % IV SOLN: 250.0000 mL | INTRAVENOUS | Status: DC | PRN

## 2017-09-16 MED ORDER — MIDAZOLAM HCL 2 MG/2ML IJ SOLN
INTRAMUSCULAR | Status: AC
  Filled 2017-09-16: qty 2

## 2017-09-16 MED ORDER — FUROSEMIDE 10 MG/ML IJ SOLN
80.0000 mg | Freq: Two times a day (BID) | INTRAMUSCULAR | Status: DC
  Administered 2017-09-16 – 2017-09-18 (×4): 80 mg via INTRAVENOUS
  Filled 2017-09-16 (×5): qty 8

## 2017-09-16 MED ORDER — HEPARIN (PORCINE) IN NACL 1000-0.9 UT/500ML-% IV SOLN
INTRAVENOUS | Status: AC
  Filled 2017-09-16: qty 1000

## 2017-09-16 MED ORDER — FENTANYL CITRATE (PF) 100 MCG/2ML IJ SOLN
INTRAMUSCULAR | Status: AC
  Filled 2017-09-16: qty 2

## 2017-09-16 MED ORDER — FENTANYL CITRATE (PF) 100 MCG/2ML IJ SOLN
INTRAMUSCULAR | Status: DC | PRN
  Administered 2017-09-16: 25 ug via INTRAVENOUS

## 2017-09-16 MED ORDER — SODIUM CHLORIDE 0.9 % IV SOLN
INTRAVENOUS | Status: AC | PRN
  Administered 2017-09-16: 10 mL/h via INTRAVENOUS

## 2017-09-16 MED ORDER — HEPARIN SODIUM (PORCINE) 5000 UNIT/ML IJ SOLN
5000.0000 [IU] | Freq: Three times a day (TID) | INTRAMUSCULAR | Status: DC
  Administered 2017-09-16 – 2017-09-19 (×8): 5000 [IU] via SUBCUTANEOUS
  Filled 2017-09-16 (×8): qty 1

## 2017-09-16 MED ORDER — IOPAMIDOL (ISOVUE-370) INJECTION 76%
INTRAVENOUS | Status: DC | PRN
  Administered 2017-09-16: 50 mL via INTRAVENOUS

## 2017-09-16 MED ORDER — LIDOCAINE HCL (PF) 1 % IJ SOLN
INTRAMUSCULAR | Status: AC
  Filled 2017-09-16: qty 30

## 2017-09-16 MED ORDER — LIDOCAINE HCL (PF) 1 % IJ SOLN
INTRAMUSCULAR | Status: DC | PRN
  Administered 2017-09-16: 17 mL

## 2017-09-16 MED ORDER — SODIUM CHLORIDE 0.9 % IV BOLUS
250.0000 mL | Freq: Once | INTRAVENOUS | Status: AC
  Administered 2017-09-16: 250 mL via INTRAVENOUS

## 2017-09-16 MED ORDER — SODIUM CHLORIDE 0.9% FLUSH
3.0000 mL | Freq: Two times a day (BID) | INTRAVENOUS | Status: DC
  Administered 2017-09-16 – 2017-09-19 (×6): 3 mL via INTRAVENOUS

## 2017-09-16 MED ORDER — IOPAMIDOL (ISOVUE-370) INJECTION 76%
INTRAVENOUS | Status: AC
  Filled 2017-09-16: qty 100

## 2017-09-16 MED ORDER — HEPARIN (PORCINE) IN NACL 2-0.9 UNITS/ML
INTRAMUSCULAR | Status: AC | PRN
  Administered 2017-09-16: 1000 mL

## 2017-09-16 MED ORDER — SODIUM CHLORIDE 0.9% FLUSH: 3.0000 mL | INTRAVENOUS | Status: DC | PRN

## 2017-09-16 MED ORDER — MIDAZOLAM HCL 2 MG/2ML IJ SOLN
INTRAMUSCULAR | Status: DC | PRN
  Administered 2017-09-16: 1 mg via INTRAVENOUS

## 2017-09-16 SURGICAL SUPPLY — 11 items
CATH INFINITI 5FR MULTPACK ANG (CATHETERS) ×3 IMPLANT
CATH SWAN GANZ 7F STRAIGHT (CATHETERS) ×3 IMPLANT
COVER PRB 48X5XTLSCP FOLD TPE (BAG) ×2 IMPLANT
COVER PROBE 5X48 (BAG) ×2
HOVERMATT SINGLE USE (MISCELLANEOUS) ×3 IMPLANT
KIT HEART LEFT (KITS) ×3 IMPLANT
PACK CARDIAC CATHETERIZATION (CUSTOM PROCEDURE TRAY) ×3 IMPLANT
SHEATH PINNACLE 5F 10CM (SHEATH) ×3 IMPLANT
SHEATH PINNACLE 7F 10CM (SHEATH) ×3 IMPLANT
TRANSDUCER W/STOPCOCK (MISCELLANEOUS) ×3 IMPLANT
WIRE EMERALD 3MM-J .035X150CM (WIRE) ×3 IMPLANT

## 2017-09-16 NOTE — Progress Notes (Signed)
ANTICOAGULATION CONSULT NOTE - Follow-Up Consult  Pharmacy Consult for heparin  Indication: chest pain/ACS  Patient Measurements: Height: 5\' 3"  (160 cm) Weight: 190 lb (86.2 kg) IBW/kg (Calculated) : 52.4 Heparin Dosing Weight: 71.1 kg  Vital Signs: Temp: 98.2 F (36.8 C) (06/14 0752) Temp Source: Oral (06/14 0752) BP: 114/84 (06/14 0752) Pulse Rate: 65 (06/14 0752)  Labs: Recent Labs    09/14/17 1510 09/14/17 2152 09/15/17 0320 09/15/17 0858 09/15/17 1220 09/16/17 0423 09/16/17 0442 09/16/17 0648  HGB 12.3  --  12.3  --   --  10.2*  --   --   HCT 37.6  --  39.7  --   --  33.3*  --   --   PLT 207  --  242  --   --  211  --   --   LABPROT  --   --   --   --   --   --   --  14.2  INR  --   --   --   --   --   --   --  1.11  HEPARINUNFRC  --   --   --  0.50  --   --  0.54  --   CREATININE 1.44*  --   --   --  1.39* 1.74*  --   --   TROPONINI 0.66* 1.76* 1.60*  --  0.93*  --   --   --     Estimated Creatinine Clearance: 27.7 mL/min (A) (by C-G formula based on SCr of 1.74 mg/dL (H)).  Assessment: [age] YOF who continues on Heparin for ACS while awaiting further work-up. Possible cath planned for 6/14.  Heparin level this morning remains therapeutic (HL 0.54 << 0.5, goal of 0.3-0.7). Hgb/Hct slight drop, plts wnl. No bleeding noted.   Goal of Therapy:  Heparin level 0.3-0.7 units/ml Monitor platelets by anticoagulation protocol: Yes   Plan:  - Continue Heparin at 850 units/hr (8.5 ml/hr) - Will continue to monitor for any signs/symptoms of bleeding and will follow up with heparin level in the a.m.  Thank you for allowing pharmacy to be a part of this patient's care.  Georgina PillionElizabeth Chiara Coltrin, PharmD, BCPS Clinical Pharmacist Pager: [phone] Clinical phone for 09/16/2017 from 7a-3:30p: [phone] If after 3:30p, please call main pharmacy at: [phone] 09/16/2017 10:54 AM

## 2017-09-16 NOTE — Progress Notes (Signed)
PROGRESS NOTE    Veronica Wolf  ZOX:[id number] DOB: [birth year] DOA: 09/14/2017 PCP: [name], NP   Brief Narrative:  HPI 09/14/2017 by Dr. Lorretta Harp Veronica Wolf is a 77 y.o. female with medical history significant of hypertension, hyperlipidemia, COPD, GERD, gout, depression with anxiety, CKD 3, peptic ulcer disease, who presents with chest pain, shortness of breath, cough, epigastric abdominal pain.  Patient states that she started having cough and shortness of breath this afternoon, which has been progressively getting worse.  She has a little mucus production.  No fever or chills.  She also reports chest pain at the same times.  The chest pain is located in the substernal area, constant, 8 out of 10 severity, pressure-like, nonradiating.  It is not pleuritic, not aggravated by deep breath or coughing.  No tenderness in the calf areas.  No recent long distance traveling.  Patient did not have abdominal pain at home, but she started having epigastric abdominal pain in the hospital.  It is constant, 8 out of 10 severity, nonradiating.  Not associated with nausea vomiting or diarrhea.  No hematochezia or hematemesis.  Patient denies symptoms of UTI or unilateral weakness.  Patient was found to have an elevated troponin 0 0.66.  The ED physician discussed with on-call cardiologist (they did not write down cardiologist/s name), who recommended to start patient with IV heparin. Pt states that she has a lot stress due to family issues recently.  Interim history Admitted for chest pain and NSTEMI, status post cardiac cath today.    Assessment & Plan   Chest pain/NSTEMI -Troponin 0.66 on admission, peaked at 1.76 -patient had persistent Chest pain vs demand ischemia -Cardiology consulted and appreciated -Placed on IV heparin -continue lisinopril, statin, aspirin -Echocardiogram: 55 to 60%, grade 1 diastolic dysfunction.  Pulmonary PA peak pressure 62 mmHg -Status post cardiac  catheterization: Small RCA 20% stenosis, posterior atrial lesion 20% stenosis.  Moderate to severe left ventricular systolic dysfunction.  Hemodynamic findings consistent with moderate pulmonary hypertension.  No significant CAD.  Normal cardiac output.  Recommend IV diuresis.  COPD exacerbation secondary to rhinovirus -Chest x-ray: Unremarkable for infection -Continue steroids, nebulizer treatments, azithromycin, Mucinex -Respiratory viral panel positive for rhinovirus/enterovirus -Blood cultures show no growth to date -strep pneumonia urine antigen negative -Continue supplemental oxygen to maintain saturations above 92%  Epigastric abdominal pain/GERD/peptic ulcer disease -Continue Protonix  Hyperlipidemia -Continue statin  Gout -Continue allopurinol  Chronic kidney disease, stage III -Creatinine 1.2-1.4, currently 1.74 (although creatinine mildly peaked, GFR remains in stage III) -Patient placed on gentle IV fluids along with a 250 cc bolus -Repeat creatinine 1.48 -Continue to monitor BMP  Essential hypertension -Continue lisinopril  Hypokalemia -Resolved -continue to monitor BMP  Depression/anxiety -Continue Lexapro, Valium, Xanax as needed  DVT Prophylaxis  heparin  Code Status: Full  Family Communication: none at bedside  Disposition Plan: Admitted, pending further workup. Home when stable  Consultants Cardiology  Procedures  None  Antibiotics   Anti-infectives (From admission, onward)   Start     Dose/Rate Route Frequency Ordered Stop   09/15/17 1000  [MAR Hold]  azithromycin (ZITHROMAX) tablet 250 mg     (MAR Hold since Fri 09/16/2017 at 1434. Reason: Transfer to a Procedural area.)   250 mg Oral Daily 09/14/17 2112 09/19/17 0959   09/14/17 2115  azithromycin (ZITHROMAX) tablet 500 mg     500 mg Oral Daily 09/14/17 2112 09/14/17 2233      Subjective:   [name] seen  and examined today.  Denies current chest pain, shortness of breath, abdominal  pain, N/V/D/C.   Objective:   Vitals:   09/16/17 1439 09/16/17 1444 09/16/17 1449 09/16/17 1454  BP: 123/81 124/77 131/80 131/79  Pulse: 73 81 73 84  Resp: 15 17 19 20   Temp:      TempSrc:      SpO2: 98% 99% 98% 99%  Weight:      Height:        Intake/Output Summary (Last 24 hours) at 09/16/2017 1507 Last data filed at 09/16/2017 1200 Gross per 24 hour  Intake 2756.33 ml  Output -  Net 2756.33 ml   Filed Weights   09/14/17 1503  Weight: 86.2 kg (190 lb)   Exam  General: Well developed, well nourished, NAD, appears stated age  HEENT: NCAT, mucous membranes moist.   Neck: Supple  Cardiovascular: S1 S2 auscultated, RRR, no murmur  Respiratory: End exp wheezing, otherwise clear  Abdomen: Soft, nontender, nondistended, + bowel sounds  Extremities: warm dry without cyanosis clubbing or edema  Neuro: AAOx3, nonfocal  Psych: Appropriate mood and affect  Data Reviewed: I have personally reviewed following labs and imaging studies  CBC: Recent Labs  Lab 09/14/17 1510 09/15/17 0320 09/16/17 0423  WBC 15.9* 12.7* 15.4*  NEUTROABS 10.4*  --   --   HGB 12.3 12.3 10.2*  HCT 37.6 39.7 33.3*  MCV 87.4 89.6 90.5  PLT 207 242 211   Basic Metabolic Panel: Recent Labs  Lab 09/14/17 1510 09/15/17 1220 09/16/17 0423 09/16/17 1239  NA 141 138 136 140  K 3.2* 5.2* 5.3* 4.8  CL 105 107 110 111  CO2 25 21* 22 20*  GLUCOSE 105* 142* 117* 103*  BUN 24* 23* 38* 35*  CREATININE 1.44* 1.39* 1.74* 1.48*  CALCIUM 8.6* 8.2* 7.9* 8.3*   GFR: Estimated Creatinine Clearance: 32.6 mL/min (A) (by C-G formula based on SCr of 1.48 mg/dL (H)). Liver Function Tests: Recent Labs  Lab 09/14/17 1510  AST 20  ALT 15  ALKPHOS 62  BILITOT 0.4  PROT 7.3  ALBUMIN 3.9   Recent Labs  Lab 09/14/17 2152  LIPASE 23   No results for input(s): AMMONIA in the last 168 hours. Coagulation Profile: Recent Labs  Lab 09/16/17 0648  INR 1.11   Cardiac Enzymes: Recent Labs  Lab  09/14/17 1510 09/14/17 2152 09/15/17 0320 09/15/17 1220  TROPONINI 0.66* 1.76* 1.60* 0.93*   BNP (last 3 results) No results for input(s): PROBNP in the last 8760 hours. HbA1C: Recent Labs    09/15/17 0320  HGBA1C 6.1*   CBG: No results for input(s): GLUCAP in the last 168 hours. Lipid Profile: Recent Labs    09/15/17 0320  CHOL 314*  HDL 78  LDLCALC 217*  TRIG 96  CHOLHDL 4.0   Thyroid Function Tests: No results for input(s): TSH, T4TOTAL, FREET4, T3FREE, THYROIDAB in the last 72 hours. Anemia Panel: No results for input(s): VITAMINB12, FOLATE, FERRITIN, TIBC, IRON, RETICCTPCT in the last 72 hours. Urine analysis:    Component Value Date/Time   COLORURINE STRAW (A) 04/05/2017 0910   APPEARANCEUR CLEAR 04/05/2017 0910   LABSPEC 1.010 04/05/2017 0910   PHURINE 6.0 04/05/2017 0910   GLUCOSEU NEGATIVE 04/05/2017 0910   HGBUR NEGATIVE 04/05/2017 0910   BILIRUBINUR NEGATIVE 04/05/2017 0910   KETONESUR NEGATIVE 04/05/2017 0910   PROTEINUR NEGATIVE 04/05/2017 0910   NITRITE NEGATIVE 04/05/2017 0910   LEUKOCYTESUR NEGATIVE 04/05/2017 0910   Sepsis Labs: @LABRCNTIP (procalcitonin:4,lacticidven:4)  ) Recent Results (  from the past 240 hour(s))  Culture, blood (routine x 2) Call MD if unable to obtain prior to antibiotics being given     Status: None (Preliminary result)   Collection Time: 09/14/17  9:52 PM  Result Value Ref Range Status   Specimen Description BLOOD RIGHT HAND  Final   Special Requests   Final    BOTTLES DRAWN AEROBIC AND ANAEROBIC Blood Culture adequate volume   Culture   Final    NO GROWTH 1 DAY Performed at [facility], [address] [place], [place] [address]    Report Status PENDING  Incomplete  Culture, blood (routine x 2) Call MD if unable to obtain prior to antibiotics being given     Status: None (Preliminary result)   Collection Time: 09/14/17  9:53 PM  Result Value Ref Range Status   Specimen Description BLOOD RIGHT HAND  Final     Special Requests   Final    BOTTLES DRAWN AEROBIC ONLY Blood Culture adequate volume   Culture   Final    NO GROWTH 1 DAY Performed at [facility], [address] [place], [place] [address]    Report Status PENDING  Incomplete  Respiratory Panel by PCR     Status: Abnormal   Collection Time: 09/14/17 10:41 PM  Result Value Ref Range Status   Adenovirus NOT DETECTED NOT DETECTED Final   Coronavirus 229E NOT DETECTED NOT DETECTED Final   Coronavirus HKU1 NOT DETECTED NOT DETECTED Final   Coronavirus NL63 NOT DETECTED NOT DETECTED Final   Coronavirus OC43 NOT DETECTED NOT DETECTED Final   Metapneumovirus NOT DETECTED NOT DETECTED Final   Rhinovirus / Enterovirus DETECTED (A) NOT DETECTED Final   Influenza A NOT DETECTED NOT DETECTED Final   Influenza B NOT DETECTED NOT DETECTED Final   Parainfluenza Virus 1 NOT DETECTED NOT DETECTED Final   Parainfluenza Virus 2 NOT DETECTED NOT DETECTED Final   Parainfluenza Virus 3 NOT DETECTED NOT DETECTED Final   Parainfluenza Virus 4 NOT DETECTED NOT DETECTED Final   Respiratory Syncytial Virus NOT DETECTED NOT DETECTED Final   Bordetella pertussis NOT DETECTED NOT DETECTED Final   Chlamydophila pneumoniae NOT DETECTED NOT DETECTED Final   Mycoplasma pneumoniae NOT DETECTED NOT DETECTED Final    Comment: Performed at [facility], [address] [place], [place] [address]  MRSA PCR Screening     Status: None   Collection Time: 09/14/17 11:55 PM  Result Value Ref Range Status   MRSA by PCR NEGATIVE NEGATIVE Final    Comment:        The GeneXpert MRSA Assay (FDA approved for NASAL specimens only), is one component of a comprehensive MRSA colonization surveillance program. It is not intended to diagnose MRSA infection nor to guide or monitor treatment for MRSA infections. Performed at [facility], [address] [place], [place] [address]       Radiology Studies: Dg Chest Portable 1 View  Result Date:  09/14/2017 CLINICAL DATA:  Shortness of breath and chest pain EXAM: PORTABLE CHEST 1 VIEW COMPARISON:  September 07, 2017 FINDINGS: Mild cardiomegaly. The hila and mediastinum are normal. Mild interstitial prominence without overt edema. Mild bibasilar atelectasis. IMPRESSION: Cardiomegaly. Pulmonary venous congestion. Probable atelectasis in the bases. Electronically Signed   By: Gerome Samavid  Williams III M.D   On: 09/14/2017 16:08     Scheduled Meds: . [MAR Hold] allopurinol  200 mg Oral Daily  . [MAR Hold] aspirin EC  81 mg Oral Daily  . [MAR Hold] atorvastatin  80 mg Oral q1800  . [MAR Hold] azithromycin  250 mg Oral Daily  . [MAR Hold] dicyclomine  10 mg Oral TID  . [MAR Hold] escitalopram  20 mg Oral Daily  . [MAR Hold] methylPREDNISolone (SOLU-MEDROL) injection  40 mg Intravenous TID  . [MAR Hold] multivitamin with minerals  1 tablet Oral Daily  . [MAR Hold] pantoprazole  40 mg Intravenous Q12H  . [MAR Hold] protein supplement shake  11 oz Oral BID BM  . sodium chloride flush  3 mL Intravenous Q12H   Continuous Infusions: . sodium chloride 75 mL/hr at 09/15/17 1226  . sodium chloride    . sodium chloride 50 mL/hr at 09/15/17 2320  . heparin Stopped (09/16/17 1400)  . [MAR Hold] nitroGLYCERIN 60 mcg/min (09/16/17 0857)     LOS: 2 days   Time Spent in minutes   30 minutes  Talishia Betzler D.O. on 09/16/2017 at 3:07 PM  Between 7am to 7pm - Pager - [phone]  After 7pm go to [email or web address] - password [email or web address]  And look for the night coverage person covering for me after hours  [facility] Office  [phone]

## 2017-09-16 NOTE — Interval H&P Note (Signed)
History and Physical Interval Note:  09/16/2017 2:24 PM  Veronica Wolf  has presented today for surgery, with the diagnosis of NSTEMI  The various methods of treatment have been discussed with the patient and family. After consideration of risks, benefits and other options for treatment, the patient has consented to  Procedure(s): RIGHT/LEFT HEART CATH AND CORONARY ANGIOGRAPHY (N/A) as a surgical intervention .  The patient's history has been reviewed, patient examined, no change in status, stable for surgery.  I have reviewed the patient's chart and labs.  Questions were answered to the patient's satisfaction.   Cath Lab Visit (complete for each Cath Lab visit)  Clinical Evaluation Leading to the Procedure:   ACS: Yes.    Non-ACS:    Anginal Classification: CCS III  Anti-ischemic medical therapy: Minimal Therapy (1 class of medications)  Non-Invasive Test Results: No non-invasive testing performed  Prior CABG: No previous CABG        Theron Aristaeter [facility]MD,FACC 09/16/2017 2:24 PM

## 2017-09-16 NOTE — Progress Notes (Signed)
Progress Note  Patient Name: Veronica Wolf Date of Encounter: 09/16/2017  Primary Cardiologist: Garwin Brothersajan R Revankar, MD   Subjective   No chest pain some SOB and wheezing, but feels better.  Wants to have cath soon.    Inpatient Medications    Scheduled Meds: . allopurinol  200 mg Oral Daily  . aspirin EC  81 mg Oral Daily  . atorvastatin  80 mg Oral q1800  . azithromycin  250 mg Oral Daily  . dicyclomine  10 mg Oral TID  . escitalopram  20 mg Oral Daily  . methylPREDNISolone (SOLU-MEDROL) injection  40 mg Intravenous TID  . multivitamin with minerals  1 tablet Oral Daily  . pantoprazole  40 mg Intravenous Q12H  . protein supplement shake  11 oz Oral BID BM  . sodium chloride flush  3 mL Intravenous Q12H   Continuous Infusions: . sodium chloride 75 mL/hr at 09/15/17 1226  . sodium chloride    . sodium chloride 50 mL/hr at 09/15/17 2320  . heparin 850 Units/hr (09/15/17 1728)  . nitroGLYCERIN 60 mcg/min (09/15/17 1534)   PRN Meds: sodium chloride, acetaminophen, ALPRAZolam, alum & mag hydroxide-simeth, bismuth subsalicylate, dextromethorphan-guaiFENesin, diazepam, hydrALAZINE, levalbuterol, meclizine, morphine injection, nitroGLYCERIN, nystatin, ondansetron (ZOFRAN) IV, sodium chloride flush, zolpidem   Vital Signs    Vitals:   09/15/17 2100 09/15/17 2353 09/16/17 0500 09/16/17 0752  BP:  (!) 106/57 (!) 120/56 114/84  Pulse:  71 66 65  Resp:  16 19 20   Temp:  (!) 97.4 F (36.3 C) 98.1 F (36.7 C) 98.2 F (36.8 C)  TempSrc:  Oral Oral Oral  SpO2: 97% 94% 96% 99%  Weight:      Height:        Intake/Output Summary (Last 24 hours) at 09/16/2017 0814 Last data filed at 09/16/2017 0400 Gross per 24 hour  Intake 2069.33 ml  Output -  Net 2069.33 ml   Filed Weights   09/14/17 1503  Weight: 190 lb (86.2 kg)    Telemetry    SR - Personally Reviewed  ECG    No new - Personally Reviewed  Physical Exam   GEN: No acute distress.   Neck: No JVD Cardiac: RRR,  no murmurs, rubs, or gallops.  Respiratory: occ wheeze to auscultation bilaterally. GI: Soft, nontender, non-distended  MS: No edema; No deformity. Neuro:  Nonfocal  Psych: Normal affect   Labs    Chemistry Recent Labs  Lab 09/14/17 1510 09/15/17 1220 09/16/17 0423  NA 141 138 136  K 3.2* 5.2* 5.3*  CL 105 107 110  CO2 25 21* 22  GLUCOSE 105* 142* 117*  BUN 24* 23* 38*  CREATININE 1.44* 1.39* 1.74*  CALCIUM 8.6* 8.2* 7.9*  PROT 7.3  --   --   ALBUMIN 3.9  --   --   AST 20  --   --   ALT 15  --   --   ALKPHOS 62  --   --   BILITOT 0.4  --   --   GFRNONAA 34* 35* 27*  GFRAA 39* 41* 31*  ANIONGAP 11 10 4*     Hematology Recent Labs  Lab 09/14/17 1510 09/15/17 0320 09/16/17 0423  WBC 15.9* 12.7* 15.4*  RBC 4.30 4.43 3.68*  HGB 12.3 12.3 10.2*  HCT 37.6 39.7 33.3*  MCV 87.4 89.6 90.5  MCH 28.6 27.8 27.7  MCHC 32.7 31.0 30.6  RDW 16.1* 15.9* 16.4*  PLT 207 242 211    Cardiac  Enzymes Recent Labs  Lab 09/14/17 1510 09/14/17 2152 09/15/17 0320 09/15/17 1220  TROPONINI 0.66* 1.76* 1.60* 0.93*   No results for input(s): TROPIPOC in the last 168 hours.   BNP Recent Labs  Lab 09/14/17 1510  BNP 162.0*     DDimer No results for input(s): DDIMER in the last 168 hours.   Radiology    Dg Chest Portable 1 View  Result Date: 09/14/2017 CLINICAL DATA:  Shortness of breath and chest pain EXAM: PORTABLE CHEST 1 VIEW COMPARISON:  September 07, 2017 FINDINGS: Mild cardiomegaly. The hila and mediastinum are normal. Mild interstitial prominence without overt edema. Mild bibasilar atelectasis. IMPRESSION: Cardiomegaly. Pulmonary venous congestion. Probable atelectasis in the bases. Electronically Signed   By: Gerome Sam III M.D   On: 09/14/2017 16:08    Cardiac Studies   2D echo 09/15/2017 Study Conclusions  - Left ventricle: The cavity size was normal. Wall thickness was normal. Systolic function was normal. The estimated ejection fraction was in the range  of 55% to 60%. Mild hypokinesis of the mid-apicallateral and inferolateral myocardium. Doppler parameters are consistent with abnormal left ventricular relaxation (grade 1 diastolic dysfunction). - Pulmonary arteries: Systolic pressure was moderately increased. PA peak pressure: 62 mm Hg (S).  Cardiac Cath 06/13/2017 Conclusion     The left ventricular systolic function is normal. The Ejection Ffraction (EF) is 55-65% by visual estimate.  LV end diastolic pressure is normal.  Prox RCA lesion is 20% stenosed. Post Atrio lesion is 20% stenosed.  Otherwise essentially angiographically normal coronary arteries.  Occluded right axillary artery  Angiographically normal coronary arteries. Occluded right axillary artery  Suspect non-anginal chest pain  Plan: The patient will be transferred back to short stay holding after sheath is removed in the PACU. She will be discharged today after bed rest. Follow-up with Dr. Tomie China  Would consider referral to vascular cardiologist (Dr. Kirke Corin or [facility]son Sabal at [facility]) to evaluate the occluded subclavian artery if there are symptoms of right arm claudication.       Patient Profile     77 y.o. female with COPD, chronic kidney disease(stage 3),GERD, gout, anxiety,hypertension and hyperlipidemia. For the past 2 weeks she has noticed increasing shortness of breath and cough productive of mild phlegm. She denies any fevers or chills. She does not have any orthopnea or PND.   She admitted to having some left sided chest pain. It is sharp and exacerbates with deep inspiration. She also complains of lower back discomfort. The chest pain is mostly constant with no particular relieving factors. She reports having a lot of stress at home due to family issues over the past 2-3 days.In March 2019 the patient was evaluated for chest pain radiating to the shoulders. She underwent a cardiac catheterization procedure that  revealed an EF of 55-60% and non-obstructive CAD. Now with + troponins  Assessment & Plan    NSTEMI   Troponin pk 1.76 Abnormal EKG with diffuse T wave inversions inf. And ant. Lat .   For cardiac cath today but Cr to 1.74, checked with Dr. Mayford Knife before proceeding will give additional 250 of NS, and recheck Cr at 1300.  Then hopefully proceed with cath.   On BB, IV NTG and Heparin.  Will give clear liquids now crackers  Moderate pulmonary hypertension Echo with PA SP 63 mmHg new-possible due to COPD exacerbation.    Recent Hx of minimal non obstructive disease of coronary arteries.  --cath today   HLD (LDL 217,  on high dose lipitor)  COPD exacerbation per IM from [facility] --on steroids and abx  ABD pain on Protonix--per IM  CKD-3  --usually Cr 1.2 to 1.4 now 1.74  HTN - on ACE -hold for cath BP controlled on hyddralazine   For questions or updates, please contact [facility] Please consult [email or web address] for contact info under Cardiology/STEMI.      Signed, Nada Boozer, NP  09/16/2017, 8:14 AM

## 2017-09-16 NOTE — H&P (View-Only) (Signed)
Progress Note  Patient Name: Veronica Wolf Date of Encounter: 09/16/2017  Primary Cardiologist: Garwin Brothersajan R Revankar, MD   Subjective   No chest pain some SOB and wheezing, but feels better.  Wants to have cath soon.    Inpatient Medications    Scheduled Meds: . allopurinol  200 mg Oral Daily  . aspirin EC  81 mg Oral Daily  . atorvastatin  80 mg Oral q1800  . azithromycin  250 mg Oral Daily  . dicyclomine  10 mg Oral TID  . escitalopram  20 mg Oral Daily  . methylPREDNISolone (SOLU-MEDROL) injection  40 mg Intravenous TID  . multivitamin with minerals  1 tablet Oral Daily  . pantoprazole  40 mg Intravenous Q12H  . protein supplement shake  11 oz Oral BID BM  . sodium chloride flush  3 mL Intravenous Q12H   Continuous Infusions: . sodium chloride 75 mL/hr at 09/15/17 1226  . sodium chloride    . sodium chloride 50 mL/hr at 09/15/17 2320  . heparin 850 Units/hr (09/15/17 1728)  . nitroGLYCERIN 60 mcg/min (09/15/17 1534)   PRN Meds: sodium chloride, acetaminophen, ALPRAZolam, alum & mag hydroxide-simeth, bismuth subsalicylate, dextromethorphan-guaiFENesin, diazepam, hydrALAZINE, levalbuterol, meclizine, morphine injection, nitroGLYCERIN, nystatin, ondansetron (ZOFRAN) IV, sodium chloride flush, zolpidem   Vital Signs    Vitals:   09/15/17 2100 09/15/17 2353 09/16/17 0500 09/16/17 0752  BP:  (!) 106/57 (!) 120/56 114/84  Pulse:  71 66 65  Resp:  16 19 20   Temp:  (!) 97.4 F (36.3 C) 98.1 F (36.7 C) 98.2 F (36.8 C)  TempSrc:  Oral Oral Oral  SpO2: 97% 94% 96% 99%  Weight:      Height:        Intake/Output Summary (Last 24 hours) at 09/16/2017 0814 Last data filed at 09/16/2017 0400 Gross per 24 hour  Intake 2069.33 ml  Output -  Net 2069.33 ml   Filed Weights   09/14/17 1503  Weight: 190 lb (86.2 kg)    Telemetry    SR - Personally Reviewed  ECG    No new - Personally Reviewed  Physical Exam   GEN: No acute distress.   Neck: No JVD Cardiac: RRR,  no murmurs, rubs, or gallops.  Respiratory: occ wheeze to auscultation bilaterally. GI: Soft, nontender, non-distended  MS: No edema; No deformity. Neuro:  Nonfocal  Psych: Normal affect   Labs    Chemistry Recent Labs  Lab 09/14/17 1510 09/15/17 1220 09/16/17 0423  NA 141 138 136  K 3.2* 5.2* 5.3*  CL 105 107 110  CO2 25 21* 22  GLUCOSE 105* 142* 117*  BUN 24* 23* 38*  CREATININE 1.44* 1.39* 1.74*  CALCIUM 8.6* 8.2* 7.9*  PROT 7.3  --   --   ALBUMIN 3.9  --   --   AST 20  --   --   ALT 15  --   --   ALKPHOS 62  --   --   BILITOT 0.4  --   --   GFRNONAA 34* 35* 27*  GFRAA 39* 41* 31*  ANIONGAP 11 10 4*     Hematology Recent Labs  Lab 09/14/17 1510 09/15/17 0320 09/16/17 0423  WBC 15.9* 12.7* 15.4*  RBC 4.30 4.43 3.68*  HGB 12.3 12.3 10.2*  HCT 37.6 39.7 33.3*  MCV 87.4 89.6 90.5  MCH 28.6 27.8 27.7  MCHC 32.7 31.0 30.6  RDW 16.1* 15.9* 16.4*  PLT 207 242 211    Cardiac  Enzymes Recent Labs  Lab 09/14/17 1510 09/14/17 2152 09/15/17 0320 09/15/17 1220  TROPONINI 0.66* 1.76* 1.60* 0.93*   No results for input(s): TROPIPOC in the last 168 hours.   BNP Recent Labs  Lab 09/14/17 1510  BNP 162.0*     DDimer No results for input(s): DDIMER in the last 168 hours.   Radiology    Dg Chest Portable 1 View  Result Date: 09/14/2017 CLINICAL DATA:  Shortness of breath and chest pain EXAM: PORTABLE CHEST 1 VIEW COMPARISON:  September 07, 2017 FINDINGS: Mild cardiomegaly. The hila and mediastinum are normal. Mild interstitial prominence without overt edema. Mild bibasilar atelectasis. IMPRESSION: Cardiomegaly. Pulmonary venous congestion. Probable atelectasis in the bases. Electronically Signed   By: Gerome Sam III M.D   On: 09/14/2017 16:08    Cardiac Studies   2D echo 09/15/2017 Study Conclusions  - Left ventricle: The cavity size was normal. Wall thickness was normal. Systolic function was normal. The estimated ejection fraction was in the range  of 55% to 60%. Mild hypokinesis of the mid-apicallateral and inferolateral myocardium. Doppler parameters are consistent with abnormal left ventricular relaxation (grade 1 diastolic dysfunction). - Pulmonary arteries: Systolic pressure was moderately increased. PA peak pressure: 62 mm Hg (S).  Cardiac Cath 06/13/2017 Conclusion     The left ventricular systolic function is normal. The Ejection Ffraction (EF) is 55-65% by visual estimate.  LV end diastolic pressure is normal.  Prox RCA lesion is 20% stenosed. Post Atrio lesion is 20% stenosed.  Otherwise essentially angiographically normal coronary arteries.  Occluded right axillary artery  Angiographically normal coronary arteries. Occluded right axillary artery  Suspect non-anginal chest pain  Plan: The patient will be transferred back to short stay holding after sheath is removed in the PACU. She will be discharged today after bed rest. Follow-up with Dr. Tomie China  Would consider referral to vascular cardiologist (Dr. Kirke Corin or Dr. Allyson Sabal at [facility]) to evaluate the occluded subclavian artery if there are symptoms of right arm claudication.       Patient Profile     77 y.o. female with COPD, chronic kidney disease(stage 3),GERD, gout, anxiety,hypertension and hyperlipidemia. For the past 2 weeks she has noticed increasing shortness of breath and cough productive of mild phlegm. She denies any fevers or chills. She does not have any orthopnea or PND.   She admitted to having some left sided chest pain. It is sharp and exacerbates with deep inspiration. She also complains of lower back discomfort. The chest pain is mostly constant with no particular relieving factors. She reports having a lot of stress at home due to family issues over the past 2-3 days.In March 2019 the patient was evaluated for chest pain radiating to the shoulders. She underwent a cardiac catheterization procedure that  revealed an EF of 55-60% and non-obstructive CAD. Now with + troponins  Assessment & Plan    NSTEMI   Troponin pk 1.76 Abnormal EKG with diffuse T wave inversions inf. And ant. Lat .   For cardiac cath today but Cr to 1.74, checked with Dr. Mayford Knife before proceeding will give additional 250 of NS, and recheck Cr at 1300.  Then hopefully proceed with cath.   On BB, IV NTG and Heparin.  Will give clear liquids now crackers  Moderate pulmonary hypertension Echo with PA SP 63 mmHg new-possible due to COPD exacerbation.    Recent Hx of minimal non obstructive disease of coronary arteries.  --cath today   HLD (LDL 217,  on high dose lipitor)  COPD exacerbation per IM from [facility] --on steroids and abx  ABD pain on Protonix--per IM  CKD-3  --usually Cr 1.2 to 1.4 now 1.74  HTN - on ACE -hold for cath BP controlled on hyddralazine   For questions or updates, please contact [facility] Please consult [email or web address] for contact info under Cardiology/STEMI.      Signed, Nada Boozer, NP  09/16/2017, 8:14 AM

## 2017-09-16 NOTE — Progress Notes (Signed)
Site area: Rt fem and rt venous sheaths Site Prior to Removal:  Level 0 Pressure Applied For: 26 min Manual:   yes Patient Status During Pull:  A/O Post Pull Site:  Level 0 Post Pull Instructions Given:  Post instructions Post Pull Pulses Present: 2+ rt dp/pt Dressing Applied:  Tegaderm and a 4x4 applied Bedrest begins @ 16:00:00 Comments: Pt leaves cath lab holding area in stable condition. Rt groin is unremarkable. No hematoma or complications. Rt groin dressing is CDI.

## 2017-09-17 LAB — CBC
HEMATOCRIT: 35.5 % — AB (ref 36.0–46.0)
Hemoglobin: 10.9 g/dL — ABNORMAL LOW (ref 12.0–15.0)
MCH: 27.5 pg (ref 26.0–34.0)
MCHC: 30.7 g/dL (ref 30.0–36.0)
MCV: 89.6 fL (ref 78.0–100.0)
Platelets: 201 10*3/uL (ref 150–400)
RBC: 3.96 MIL/uL (ref 3.87–5.11)
RDW: 16.2 % — ABNORMAL HIGH (ref 11.5–15.5)
WBC: 11.5 10*3/uL — AB (ref 4.0–10.5)

## 2017-09-17 LAB — BASIC METABOLIC PANEL
ANION GAP: 9 (ref 5–15)
BUN: 32 mg/dL — ABNORMAL HIGH (ref 6–20)
CO2: 24 mmol/L (ref 22–32)
Calcium: 8.3 mg/dL — ABNORMAL LOW (ref 8.9–10.3)
Chloride: 106 mmol/L (ref 101–111)
Creatinine, Ser: 1.57 mg/dL — ABNORMAL HIGH (ref 0.44–1.00)
GFR calc Af Amer: 35 mL/min — ABNORMAL LOW (ref >60)
GFR calc non Af Amer: 30 mL/min — ABNORMAL LOW (ref >60)
GLUCOSE: 121 mg/dL — AB (ref 65–99)
POTASSIUM: 5 mmol/L (ref 3.5–5.1)
Sodium: 139 mmol/L (ref 135–145)

## 2017-09-17 MED ORDER — LEVALBUTEROL HCL 0.63 MG/3ML IN NEBU
INHALATION_SOLUTION | RESPIRATORY_TRACT | Status: AC
  Administered 2017-09-17: 08:00:00 1.26 mg
  Filled 2017-09-17: qty 6

## 2017-09-17 MED ORDER — METHYLPREDNISOLONE SODIUM SUCC 40 MG IJ SOLR
40.0000 mg | INTRAMUSCULAR | Status: DC
  Administered 2017-09-18: 40 mg via INTRAVENOUS
  Filled 2017-09-17: qty 1

## 2017-09-17 MED ORDER — PANTOPRAZOLE SODIUM 40 MG PO TBEC
40.0000 mg | DELAYED_RELEASE_TABLET | Freq: Two times a day (BID) | ORAL | Status: DC
  Administered 2017-09-17 – 2017-09-19 (×5): 40 mg via ORAL
  Filled 2017-09-17 (×5): qty 1

## 2017-09-17 MED ORDER — LISINOPRIL 5 MG PO TABS
2.5000 mg | ORAL_TABLET | Freq: Every day | ORAL | Status: DC
  Administered 2017-09-17 – 2017-09-19 (×3): 2.5 mg via ORAL
  Filled 2017-09-17 (×3): qty 1

## 2017-09-17 NOTE — Progress Notes (Addendum)
Progress Note  Patient Name: Veronica Wolf Date of Encounter: 09/17/2017  Primary Cardiologist: Garwin Brothersajan R Revankar, MD   Subjective   No pain, worried about breakfast.  + wheezes   Inpatient Medications    Scheduled Meds: . allopurinol  200 mg Oral Daily  . aspirin EC  81 mg Oral Daily  . atorvastatin  80 mg Oral q1800  . azithromycin  250 mg Oral Daily  . dicyclomine  10 mg Oral TID  . escitalopram  20 mg Oral Daily  . furosemide  80 mg Intravenous Q12H  . heparin  5,000 Units Subcutaneous Q8H  . methylPREDNISolone (SOLU-MEDROL) injection  40 mg Intravenous TID  . multivitamin with minerals  1 tablet Oral Daily  . pantoprazole  40 mg Intravenous Q12H  . protein supplement shake  11 oz Oral BID BM  . sodium chloride flush  3 mL Intravenous Q12H   Continuous Infusions: . sodium chloride    . nitroGLYCERIN 60 mcg/min (09/16/17 0857)   PRN Meds: sodium chloride, acetaminophen, ALPRAZolam, alum & mag hydroxide-simeth, bismuth subsalicylate, dextromethorphan-guaiFENesin, diazepam, hydrALAZINE, levalbuterol, meclizine, morphine injection, nitroGLYCERIN, nystatin, ondansetron (ZOFRAN) IV, sodium chloride flush, zolpidem   Vital Signs    Vitals:   09/16/17 1926 09/16/17 1936 09/16/17 2004 09/17/17 0344  BP: (!) 84/53 (!) 79/48 (!) 131/58 (!) 151/75  Pulse: 78 80 81 82  Resp: 20 18 18 20   Temp:  98.2 F (36.8 C)  97.7 F (36.5 C)  TempSrc:  Axillary  Oral  SpO2: 100% 100% 100% 100%  Weight:    209 lb 14.1 oz (95.2 kg)  Height:        Intake/Output Summary (Last 24 hours) at 09/17/2017 [id number] Last data filed at 09/17/2017 0600 Gross per 24 hour  Intake 1212 ml  Output 2400 ml  Net -1188 ml   Filed Weights   09/14/17 1503 09/17/17 0344  Weight: 190 lb (86.2 kg) 209 lb 14.1 oz (95.2 kg)    Telemetry    SR - Personally Reviewed  ECG    No new - Personally Reviewed  Physical Exam   GEN: No acute distress.   Neck: No JVD Cardiac: RRR, no murmurs, rubs, or  gallops. Rt wrist cath site stable Respiratory: + wheezes,  to auscultation bilaterally and throughout, still SOB with activity. GI: Soft, nontender, non-distended  MS: No to tr lower ext edema; No deformity. Neuro:  Nonfocal  Psych: Normal affect   Labs    Chemistry Recent Labs  Lab 09/14/17 1510 09/15/17 1220 09/16/17 0423 09/16/17 1239  NA 141 138 136 140  K 3.2* 5.2* 5.3* 4.8  CL 105 107 110 111  CO2 25 21* 22 20*  GLUCOSE 105* 142* 117* 103*  BUN 24* 23* 38* 35*  CREATININE 1.44* 1.39* 1.74* 1.48*  CALCIUM 8.6* 8.2* 7.9* 8.3*  PROT 7.3  --   --   --   ALBUMIN 3.9  --   --   --   AST 20  --   --   --   ALT 15  --   --   --   ALKPHOS 62  --   --   --   BILITOT 0.4  --   --   --   GFRNONAA 34* 35* 27* 33*  GFRAA 39* 41* 31* 38*  ANIONGAP 11 10 4* 9     Hematology Recent Labs  Lab 09/14/17 1510 09/15/17 0320 09/16/17 0423  WBC 15.9* 12.7* 15.4*  RBC 4.30 4.43  3.68*  HGB 12.3 12.3 10.2*  HCT 37.6 39.7 33.3*  MCV 87.4 89.6 90.5  MCH 28.6 27.8 27.7  MCHC 32.7 31.0 30.6  RDW 16.1* 15.9* 16.4*  PLT 207 242 211    Cardiac Enzymes Recent Labs  Lab 09/14/17 1510 09/14/17 2152 09/15/17 0320 09/15/17 1220  TROPONINI 0.66* 1.76* 1.60* 0.93*   No results for input(s): TROPIPOC in the last 168 hours.   BNP Recent Labs  Lab 09/14/17 1510  BNP 162.0*     DDimer No results for input(s): DDIMER in the last 168 hours.   Radiology    No results found.  Cardiac Studies   Rt and Lt heart cath 09/16/17 Conclusion     Prox RCA lesion is 20% stenosed.  Post Atrio lesion is 20% stenosed.  There is moderate to severe left ventricular systolic dysfunction.  Hemodynamic findings consistent with moderate pulmonary hypertension.   1. No significant CAD 2. Moderate to severely elevated LV filling pressures 3. Moderate pulmonary venous hypertension.  4. Normal cardiac output.  Plan: recommend IV diuresis. I suspect elevated troponin is due to demand  ischemia in setting of decompensated heart failure.       Patient Profile     77 y.o. female with COPD, chronic kidney disease(stage 3),GERD, gout, anxiety,hypertension and hyperlipidemia. For the past 2 weeks she has noticed increasing shortness of breath and cough productive of mild phlegm. She denies any fevers or chills. She does not have any orthopnea or PND.   She admitted to having some left sided chest pain. It is sharp and exacerbates with deep inspiration. She also complains of lower back discomfort. The chest pain is mostly constant with no particular relieving factors. She reports having a lot of stress at home due to family issues over the past 2-3 days.In March 2019 the patient was evaluated for chest pain radiating to the shoulders. She underwent a cardiac catheterization procedure that revealed an EF of 55-60% and non-obstructive CAD. Now with + troponins    Assessment & Plan    NSTEMI   Troponin pk 1.76 Abnormal EKG with diffuse T wave inversions inf. And ant. Lat .   Was on BB, IV NTG and Heparin.  -cardiac cath with minimal non obstructive disease.  Mod to severe LV systolic dysfunction, moderate pulmonary hypertension.   --elevated troponins most likely from demand ischemia in setting of decompensated HF.   --normal cardiac output  Moderate pulmonary hypertension Echo with PA SP 63 mmHg new-possible due to COPD exacerbation.   -continue with another day of diuresis, Dr. Wyline Mood to see. --+ 1375 was + 2121 since admit wt up from 190 to 209 lbs.   Recent Hx of minimal non obstructive disease of coronary arteries.  --cath this admit with same see above   HLD (LDL 217, on high dose lipitor) continue   COPD exacerbation per IM from [facility] --on steroids and abx --still with wheezes   ABD pain on Protonix--per IM  CKD-3  --usually Cr 1.2 to 1.4 up to  1.74 then down with fluids for cath  --Labs P  HTN - on ACE -hold for cath BP controlled on  hyddralazine --if Cr stable resume ACE.   --BP now 133/94     For questions or updates, please contact [facility] Please consult [email or web address] for contact info under Cardiology/STEMI.      Signed, Nada Boozer, NP  09/17/2017, 7:12 AM    Patient disucssed and seen with PA Annie Paras, I agree with her  documentation. 77 yo female with COPD, CKD 3, HTN, HLadmited with cough and SOB and chest pain. Found to have + troponins and EKG changes. Echo with LVEF 55-60%, mild hypokinesis of apical lateral and inferolateral walls which is new, PASP 62. Cath yesterday wit hpatent vessels. Moderate to severely elevated pressures (mean PA 41, PCWP 29, CI 2.8). RHC results consistent with pulmonary HTN due to left sided heart disease. Will require diuresis. Likely with stress induced CM given WMA, EKG changes, troponin, and normal coronaries. Medical therapy with ASA, atorva 80. Start low dose beta Toprol XL 12.5mg  daily once wheezing improves. Start low dose ACE-I today. Discrepancy between echo and LV gram LVEF, will review echo. Continue IV diuresis today.    Dominga Ferry MD

## 2017-09-17 NOTE — Progress Notes (Signed)
Called report to Leotis ShamesLauren, RN on 3E, patient transferred with all belongings.

## 2017-09-17 NOTE — Progress Notes (Signed)
PROGRESS NOTE    Veronica Wolf  ZOX:[id number] DOB: 11/07/1940 DOA: 09/14/2017 PCP: [name], NP   Brief Narrative:  HPI 09/14/2017 by Dr. Lorretta Harp Veronica Wolf is a 77 y.o. female with medical history significant of hypertension, hyperlipidemia, COPD, GERD, gout, depression with anxiety, CKD 3, peptic ulcer disease, who presents with chest pain, shortness of breath, cough, epigastric abdominal pain.  Patient states that she started having cough and shortness of breath this afternoon, which has been progressively getting worse.  She has a little mucus production.  No fever or chills.  She also reports chest pain at the same times.  The chest pain is located in the substernal area, constant, 8 out of 10 severity, pressure-like, nonradiating.  It is not pleuritic, not aggravated by deep breath or coughing.  No tenderness in the calf areas.  No recent long distance traveling.  Patient did not have abdominal pain at home, but she started having epigastric abdominal pain in the hospital.  It is constant, 8 out of 10 severity, nonradiating.  Not associated with nausea vomiting or diarrhea.  No hematochezia or hematemesis.  Patient denies symptoms of UTI or unilateral weakness.  Patient was found to have an elevated troponin 0 0.66.  The ED physician discussed with on-call cardiologist (they did not write down cardiologist/s name), who recommended to start patient with IV heparin. Pt states that she has a lot stress due to family issues recently.  Interim history Admitted for chest pain and NSTEMI, status post cardiac cath today.    Assessment & Plan   Chest pain/NSTEMI -Troponin 0.66 on admission, peaked at 1.76 -patient had persistent Chest pain vs demand ischemia -Cardiology consulted and appreciated -Placed on IV heparin -continue lisinopril, statin, aspirin -Echocardiogram: 55 to 60%, grade 1 diastolic dysfunction.  Pulmonary PA peak pressure 62 mmHg -Status post cardiac  catheterization: Small RCA 20% stenosis, posterior atrial lesion 20% stenosis.  Moderate to severe left ventricular systolic dysfunction.  Hemodynamic findings consistent with moderate pulmonary hypertension.  No significant CAD.  Normal cardiac output.  Recommend IV diuresis. -Cardiology recommended low dose Toprol once wheezing improves  Moderate pulmonary hypertension -noted on cath as stated above -Continue diuresis- IV lasix 80mg  BID, per cardiology  COPD exacerbation secondary to rhinovirus -Chest x-ray: Unremarkable for infection -Continue steroids, nebulizer treatments, azithromycin, Mucinex -Respiratory viral panel positive for rhinovirus/enterovirus -Blood cultures show no growth to date -strep pneumonia urine antigen negative -Continue supplemental oxygen to maintain saturations above 92%  Epigastric abdominal pain/GERD/peptic ulcer disease -Continue Protonix  Hyperlipidemia -Continue statin  Gout -Continue allopurinol  Chronic kidney disease, stage III -Creatinine 1.2-1.4, currently 1.74 (although creatinine mildly peaked, GFR remains in stage III) -was placed on IVF prior to cath -Creatinine currently 1.57 -Continue to monitor BMP  Essential hypertension -Continue lisinopril  Hypokalemia -Resolved -continue to monitor BMP  Depression/anxiety -Continue Lexapro, Valium, Xanax as needed  Chronic normocytic anemia -reviewing patient's chart, hemoglobin has ranged from 10-12 (baseline) -Currently 10.9 (drom from 12 likely due to dilutional component as patient received IVF) -Continue to monitor CBC  DVT Prophylaxis  heparin  Code Status: Full  Family Communication: none at bedside  Disposition Plan: Admitted, continue to diurese per cardiology. Suspect home when stable  Consultants Cardiology  Procedures  Echocardiogram Right/left heart cath and coronary angiography  Antibiotics   Anti-infectives (From admission, onward)   Start     Dose/Rate  Route Frequency Ordered Stop   09/15/17 1000  azithromycin (ZITHROMAX) tablet 250 mg  250 mg Oral Daily 09/14/17 2112 09/19/17 0959   09/14/17 2115  azithromycin (ZITHROMAX) tablet 500 mg     500 mg Oral Daily 09/14/17 2112 09/14/17 2233      Subjective:   Veronica Wolf seen and examined today.  Denies current chest pain. Feels breathing is fine, but has wheezing. Denies cough. Denies abdominal pain, N/V/D/C.   Objective:   Vitals:   09/17/17 0344 09/17/17 0701 09/17/17 1031 09/17/17 1137  BP: (!) 151/75 (!) 133/94 134/64 131/70  Pulse: 82 72 75 72  Resp: 20 13 18 16   Temp: 97.7 F (36.5 C) 98 F (36.7 C)  98.2 F (36.8 C)  TempSrc: Oral Oral  Oral  SpO2: 100% 99% 97% 98%  Weight: 95.2 kg (209 lb 14.1 oz)     Height:        Intake/Output Summary (Last 24 hours) at 09/17/2017 1158 Last data filed at 09/17/2017 1009 Gross per 24 hour  Intake 1452 ml  Output 3200 ml  Net -1748 ml   Filed Weights   09/14/17 1503 09/17/17 0344  Weight: 86.2 kg (190 lb) 95.2 kg (209 lb 14.1 oz)   Exam  General: Well developed, well nourished, NAD, appears stated age  HEENT: NCAT,mucous membranes moist.   Neck: Supple, no JVD  Cardiovascular: S1 S2 auscultated, no murmurs, RRR  Respiratory: Diffuse exp wheezing  Abdomen: Soft, nontender, nondistended, + bowel sounds  Extremities: warm dry without cyanosis clubbing or edema  Neuro: AAOx3, nonfocal  Psych: Appropriate mood and affect  Data Reviewed: I have personally reviewed following labs and imaging studies  CBC: Recent Labs  Lab 09/14/17 1510 09/15/17 0320 09/16/17 0423 09/17/17 0642  WBC 15.9* 12.7* 15.4* 11.5*  NEUTROABS 10.4*  --   --   --   HGB 12.3 12.3 10.2* 10.9*  HCT 37.6 39.7 33.3* 35.5*  MCV 87.4 89.6 90.5 89.6  PLT 207 242 211 201   Basic Metabolic Panel: Recent Labs  Lab 09/14/17 1510 09/15/17 1220 09/16/17 0423 09/16/17 1239 09/17/17 0642  NA 141 138 136 140 139  K 3.2* 5.2* 5.3* 4.8 5.0  CL  105 107 110 111 106  CO2 25 21* 22 20* 24  GLUCOSE 105* 142* 117* 103* 121*  BUN 24* 23* 38* 35* 32*  CREATININE 1.44* 1.39* 1.74* 1.48* 1.57*  CALCIUM 8.6* 8.2* 7.9* 8.3* 8.3*   GFR: Estimated Creatinine Clearance: 32.4 mL/min (A) (by C-G formula based on SCr of 1.57 mg/dL (H)). Liver Function Tests: Recent Labs  Lab 09/14/17 1510  AST 20  ALT 15  ALKPHOS 62  BILITOT 0.4  PROT 7.3  ALBUMIN 3.9   Recent Labs  Lab 09/14/17 2152  LIPASE 23   No results for input(s): AMMONIA in the last 168 hours. Coagulation Profile: Recent Labs  Lab 09/16/17 0648  INR 1.11   Cardiac Enzymes: Recent Labs  Lab 09/14/17 1510 09/14/17 2152 09/15/17 0320 09/15/17 1220  TROPONINI 0.66* 1.76* 1.60* 0.93*   BNP (last 3 results) No results for input(s): PROBNP in the last 8760 hours. HbA1C: Recent Labs    09/15/17 0320  HGBA1C 6.1*   CBG: No results for input(s): GLUCAP in the last 168 hours. Lipid Profile: Recent Labs    09/15/17 0320  CHOL 314*  HDL 78  LDLCALC 217*  TRIG 96  CHOLHDL 4.0   Thyroid Function Tests: No results for input(s): TSH, T4TOTAL, FREET4, T3FREE, THYROIDAB in the last 72 hours. Anemia Panel: No results for input(s): VITAMINB12, FOLATE, FERRITIN, TIBC,  IRON, RETICCTPCT in the last 72 hours. Urine analysis:    Component Value Date/Time   COLORURINE STRAW (A) 04/05/2017 0910   APPEARANCEUR CLEAR 04/05/2017 0910   LABSPEC 1.010 04/05/2017 0910   PHURINE 6.0 04/05/2017 0910   GLUCOSEU NEGATIVE 04/05/2017 0910   HGBUR NEGATIVE 04/05/2017 0910   BILIRUBINUR NEGATIVE 04/05/2017 0910   KETONESUR NEGATIVE 04/05/2017 0910   PROTEINUR NEGATIVE 04/05/2017 0910   NITRITE NEGATIVE 04/05/2017 0910   LEUKOCYTESUR NEGATIVE 04/05/2017 0910   Sepsis Labs: @LABRCNTIP (procalcitonin:4,lacticidven:4)  ) Recent Results (from the past 240 hour(s))  Culture, blood (routine x 2) Call MD if unable to obtain prior to antibiotics being given     Status: None  (Preliminary result)   Collection Time: 09/14/17  9:52 PM  Result Value Ref Range Status   Specimen Description BLOOD RIGHT HAND  Final   Special Requests   Final    BOTTLES DRAWN AEROBIC AND ANAEROBIC Blood Culture adequate volume   Culture   Final    NO GROWTH 1 DAY Performed at [facility], [address], [place], [place] [address]    Report Status PENDING  Incomplete  Culture, blood (routine x 2) Call MD if unable to obtain prior to antibiotics being given     Status: None (Preliminary result)   Collection Time: 09/14/17  9:53 PM  Result Value Ref Range Status   Specimen Description BLOOD RIGHT HAND  Final   Special Requests   Final    BOTTLES DRAWN AEROBIC ONLY Blood Culture adequate volume   Culture   Final    NO GROWTH 1 DAY Performed at [facility], [address], [place], [place] [address]    Report Status PENDING  Incomplete  Respiratory Panel by PCR     Status: Abnormal   Collection Time: 09/14/17 10:41 PM  Result Value Ref Range Status   Adenovirus NOT DETECTED NOT DETECTED Final   Coronavirus 229E NOT DETECTED NOT DETECTED Final   Coronavirus HKU1 NOT DETECTED NOT DETECTED Final   Coronavirus NL63 NOT DETECTED NOT DETECTED Final   Coronavirus OC43 NOT DETECTED NOT DETECTED Final   Metapneumovirus NOT DETECTED NOT DETECTED Final   Rhinovirus / Enterovirus DETECTED (A) NOT DETECTED Final   Influenza A NOT DETECTED NOT DETECTED Final   Influenza B NOT DETECTED NOT DETECTED Final   Parainfluenza Virus 1 NOT DETECTED NOT DETECTED Final   Parainfluenza Virus 2 NOT DETECTED NOT DETECTED Final   Parainfluenza Virus 3 NOT DETECTED NOT DETECTED Final   Parainfluenza Virus 4 NOT DETECTED NOT DETECTED Final   Respiratory Syncytial Virus NOT DETECTED NOT DETECTED Final   Bordetella pertussis NOT DETECTED NOT DETECTED Final   Chlamydophila pneumoniae NOT DETECTED NOT DETECTED Final   Mycoplasma pneumoniae NOT DETECTED NOT DETECTED Final    Comment: Performed  at [facility], [address], [place], [place] [address]  MRSA PCR Screening     Status: None   Collection Time: 09/14/17 11:55 PM  Result Value Ref Range Status   MRSA by PCR NEGATIVE NEGATIVE Final    Comment:        The GeneXpert MRSA Assay (FDA approved for NASAL specimens only), is one component of a comprehensive MRSA colonization surveillance program. It is not intended to diagnose MRSA infection nor to guide or monitor treatment for MRSA infections. Performed at [facility], [address] [place], [place] [address]       Radiology Studies: No results found.  Scheduled Meds: . allopurinol  200 mg Oral Daily  . aspirin EC  81 mg Oral Daily  . atorvastatin  80 mg Oral q1800  . azithromycin  250 mg Oral Daily  . dicyclomine  10 mg Oral TID  . escitalopram  20 mg Oral Daily  . furosemide  80 mg Intravenous Q12H  . heparin  5,000 Units Subcutaneous Q8H  . lisinopril  2.5 mg Oral Daily  . methylPREDNISolone (SOLU-MEDROL) injection  40 mg Intravenous TID  . multivitamin with minerals  1 tablet Oral Daily  . pantoprazole  40 mg Oral BID  . protein supplement shake  11 oz Oral BID BM  . sodium chloride flush  3 mL Intravenous Q12H   Continuous Infusions: . sodium chloride       LOS: 3 days   Time Spent in minutes   30 minutes  Veronica Wolf D.O. on 09/17/2017 at 11:58 AM  Between 7am to 7pm - Pager - [phone]  After 7pm go to [email or web address] - password [email or web address]  And look for the night coverage person covering for me after hours  [facility] Office  [phone]

## 2017-09-17 NOTE — Progress Notes (Signed)
Patient arrived from 6E via bed.  Patient is alert and oriented with no complaints of pain.  Patient oriented on room, phone, and call bell.  Will continue to monitor patient.

## 2017-09-18 LAB — BASIC METABOLIC PANEL
ANION GAP: 9 (ref 5–15)
BUN: 57 mg/dL — ABNORMAL HIGH (ref 6–20)
CO2: 28 mmol/L (ref 22–32)
Calcium: 8.6 mg/dL — ABNORMAL LOW (ref 8.9–10.3)
Chloride: 98 mmol/L — ABNORMAL LOW (ref 101–111)
Creatinine, Ser: 1.89 mg/dL — ABNORMAL HIGH (ref 0.44–1.00)
GFR calc non Af Amer: 24 mL/min — ABNORMAL LOW (ref >60)
GFR, EST AFRICAN AMERICAN: 28 mL/min — AB (ref >60)
GLUCOSE: 76 mg/dL (ref 65–99)
POTASSIUM: 5.2 mmol/L — AB (ref 3.5–5.1)
Sodium: 135 mmol/L (ref 135–145)

## 2017-09-18 LAB — CBC
HEMATOCRIT: 42.9 % (ref 36.0–46.0)
HEMOGLOBIN: 13.5 g/dL (ref 12.0–15.0)
MCH: 28 pg (ref 26.0–34.0)
MCHC: 31.5 g/dL (ref 30.0–36.0)
MCV: 88.8 fL (ref 78.0–100.0)
Platelets: 240 10*3/uL (ref 150–400)
RBC: 4.83 MIL/uL (ref 3.87–5.11)
RDW: 16.5 % — ABNORMAL HIGH (ref 11.5–15.5)
WBC: 20 10*3/uL — AB (ref 4.0–10.5)

## 2017-09-18 MED ORDER — METOPROLOL SUCCINATE ER 25 MG PO TB24
12.5000 mg | ORAL_TABLET | Freq: Every day | ORAL | Status: DC
  Administered 2017-09-18 – 2017-09-19 (×2): 12.5 mg via ORAL
  Filled 2017-09-18 (×2): qty 1

## 2017-09-18 MED ORDER — PREDNISONE 20 MG PO TABS
40.0000 mg | ORAL_TABLET | Freq: Every day | ORAL | Status: DC
  Administered 2017-09-19: 40 mg via ORAL
  Filled 2017-09-18: qty 2

## 2017-09-18 NOTE — Progress Notes (Signed)
PROGRESS NOTE    Veronica Wolf  ZOX:[id number] DOB: 03/16/1941 DOA: 09/14/2017 PCP: Veronica Craze'Sullivan, Melissa, NP   Brief Narrative:  HPI 09/14/2017 by Dr. Lorretta HarpXilin Wolf Veronica Wolf is a 77 y.o. female with medical history significant of hypertension, hyperlipidemia, COPD, GERD, gout, depression with anxiety, CKD 3, peptic ulcer disease, who presents with chest pain, shortness of breath, cough, epigastric abdominal pain.  Patient states that she started having cough and shortness of breath this afternoon, which has been progressively getting worse.  She has a little mucus production.  No fever or chills.  She also reports chest pain at the same times.  The chest pain is located in the substernal area, constant, 8 out of 10 severity, pressure-like, nonradiating.  It is not pleuritic, not aggravated by deep breath or coughing.  No tenderness in the calf areas.  No recent long distance traveling.  Patient did not have abdominal pain at home, but she started having epigastric abdominal pain in the hospital.  It is constant, 8 out of 10 severity, nonradiating.  Not associated with nausea vomiting or diarrhea.  No hematochezia or hematemesis.  Patient denies symptoms of UTI or unilateral weakness.  Patient was found to have an elevated troponin 0 0.66.  The ED physician discussed with on-call cardiologist (they did not write down cardiologist/s name), who recommended to start patient with IV heparin. Pt states that she has a lot stress due to family issues recently.  Interim history Admitted for chest pain and NSTEMI, status post cardiac cath. Plan for diuresis given pulmonary hypertension on cath.   Assessment & Plan   Chest pain/NSTEMI -Troponin 0.66 on admission, peaked at 1.76 -patient had persistent Chest pain vs demand ischemia -Cardiology consulted and appreciated -Placed on IV heparin -continue lisinopril, statin, aspirin -Echocardiogram: 55 to 60%, grade 1 diastolic dysfunction.  Pulmonary PA peak  pressure 62 mmHg -Status post cardiac catheterization: Small RCA 20% stenosis, posterior atrial lesion 20% stenosis.  Moderate to severe left ventricular systolic dysfunction.  Hemodynamic findings consistent with moderate pulmonary hypertension.  No significant CAD.  Normal cardiac output.  Recommend IV diuresis. -Cardiology recommended low dose Toprol once wheezing improves  Moderate pulmonary hypertension -noted on cath as stated above -Continue diuresis- IV lasix 80mg  BID, per cardiology  COPD exacerbation secondary to rhinovirus -Chest x-ray: Unremarkable for infection -Continue steroids, nebulizer treatments, azithromycin, Mucinex -Respiratory viral panel positive for rhinovirus/enterovirus -Blood cultures show no growth to date -strep pneumonia urine antigen negative -Continue supplemental oxygen to maintain saturations above 92%  Leukocytosis -Suspect secondary to steroids, will continue to monitor CBC -currently, patient denies any cough or congestion/upper respiratory symptoms or  dysuria  Epigastric abdominal pain/GERD/peptic ulcer disease -Continue Protonix  Hyperlipidemia -Continue statin  Gout -Continue allopurinol  Acute kidney injury on Chronic kidney disease, stage III -Creatinine 1.2-1.4, currently 1.89 -suspect secondary to diuresis, which has been held for today -Continue to monitor BMP  Essential hypertension -Continue lisinopril, metoprolol   Hypokalemia/Hyperkalemia -K currently 5.2 -continue to monitor BMP  Depression/anxiety -Continue Lexapro, Valium, Xanax as needed  Chronic normocytic anemia -reviewing patient's chart, hemoglobin has ranged from 10-12 (baseline) -Currently hemoglobin 13.5 (suspect due to diuresis) -Continue to monitor CBC  DVT Prophylaxis  heparin  Code Status: Full  Family Communication: none at bedside  Disposition Plan: Admitted, continue to diurese per cardiology. Suspect home when  stable  Consultants Cardiology  Procedures  Echocardiogram Right/left heart cath and coronary angiography  Antibiotics   Anti-infectives (From admission, onward)  Start     Dose/Rate Route Frequency Ordered Stop   09/15/17 1000  azithromycin (ZITHROMAX) tablet 250 mg     250 mg Oral Daily 09/14/17 2112 09/18/17 0922   09/14/17 2115  azithromycin (ZITHROMAX) tablet 500 mg     500 mg Oral Daily 09/14/17 2112 09/14/17 2233      Subjective:   Veronica Wolf seen and examined today.  Denies current chest pain, shortness of breath, abdominal pain, nausea or vomiting, dizziness or headache.  Wishes she can go home but understands the need to be hospitalized.  Objective:   Vitals:   09/18/17 0015 09/18/17 0401 09/18/17 1143 09/18/17 1146  BP: (!) 90/58 136/78  (!) 138/113  Pulse: 78 73  72  Resp: 18 18  17   Temp: (!) 97.5 F (36.4 C) 97.6 F (36.4 C) 97.8 F (36.6 C)   TempSrc: Oral Oral Oral   SpO2: 98% 93%  94%  Weight:  89.8 kg (197 lb 14.4 oz)    Height:        Intake/Output Summary (Last 24 hours) at 09/18/2017 1255 Last data filed at 09/18/2017 0924 Gross per 24 hour  Intake 583 ml  Output 4125 ml  Net -3542 ml   Filed Weights   09/14/17 1503 09/17/17 0344 09/18/17 0401  Weight: 86.2 kg (190 lb) 95.2 kg (209 lb 14.1 oz) 89.8 kg (197 lb 14.4 oz)   Exam  General: Well developed, well nourished, NAD, appears stated age  HEENT: NCAT, mucous membranes moist.   Neck: Supple, no JVD  Cardiovascular: S1 S2 auscultated, RRR, no murmur  Respiratory: Diffuse expiratory wheezing   Abdomen: Soft, nontender, nondistended, + bowel sounds  Extremities: warm dry without cyanosis clubbing or edema  Neuro: AAOx3, nonfocal  Psych:, appropriate mood and affect  Data Reviewed: I have personally reviewed following labs and imaging studies  CBC: Recent Labs  Lab 09/14/17 1510 09/15/17 0320 09/16/17 0423 09/17/17 0642 09/18/17 0719  WBC 15.9* 12.7* 15.4* 11.5* 20.0*   NEUTROABS 10.4*  --   --   --   --   HGB 12.3 12.3 10.2* 10.9* 13.5  HCT 37.6 39.7 33.3* 35.5* 42.9  MCV 87.4 89.6 90.5 89.6 88.8  PLT 207 242 211 201 240   Basic Metabolic Panel: Recent Labs  Lab 09/15/17 1220 09/16/17 0423 09/16/17 1239 09/17/17 0642 09/18/17 0719  NA 138 136 140 139 135  K 5.2* 5.3* 4.8 5.0 5.2*  CL 107 110 111 106 98*  CO2 21* 22 20* 24 28  GLUCOSE 142* 117* 103* 121* 76  BUN 23* 38* 35* 32* 57*  CREATININE 1.39* 1.74* 1.48* 1.57* 1.89*  CALCIUM 8.2* 7.9* 8.3* 8.3* 8.6*   GFR: Estimated Creatinine Clearance: 26.1 mL/min (A) (by C-G formula based on SCr of 1.89 mg/dL (H)). Liver Function Tests: Recent Labs  Lab 09/14/17 1510  AST 20  ALT 15  ALKPHOS 62  BILITOT 0.4  PROT 7.3  ALBUMIN 3.9   Recent Labs  Lab 09/14/17 2152  LIPASE 23   No results for input(s): AMMONIA in the last 168 hours. Coagulation Profile: Recent Labs  Lab 09/16/17 0648  INR 1.11   Cardiac Enzymes: Recent Labs  Lab 09/14/17 1510 09/14/17 2152 09/15/17 0320 09/15/17 1220  TROPONINI 0.66* 1.76* 1.60* 0.93*   BNP (last 3 results) No results for input(s): PROBNP in the last 8760 hours. HbA1C: No results for input(s): HGBA1C in the last 72 hours. CBG: No results for input(s): GLUCAP in the last 168 hours.  Lipid Profile: No results for input(s): CHOL, HDL, LDLCALC, TRIG, CHOLHDL, LDLDIRECT in the last 72 hours. Thyroid Function Tests: No results for input(s): TSH, T4TOTAL, FREET4, T3FREE, THYROIDAB in the last 72 hours. Anemia Panel: No results for input(s): VITAMINB12, FOLATE, FERRITIN, TIBC, IRON, RETICCTPCT in the last 72 hours. Urine analysis:    Component Value Date/Time   COLORURINE STRAW (A) 04/05/2017 0910   APPEARANCEUR CLEAR 04/05/2017 0910   LABSPEC 1.010 04/05/2017 0910   PHURINE 6.0 04/05/2017 0910   GLUCOSEU NEGATIVE 04/05/2017 0910   HGBUR NEGATIVE 04/05/2017 0910   BILIRUBINUR NEGATIVE 04/05/2017 0910   KETONESUR NEGATIVE 04/05/2017 0910    PROTEINUR NEGATIVE 04/05/2017 0910   NITRITE NEGATIVE 04/05/2017 0910   LEUKOCYTESUR NEGATIVE 04/05/2017 0910   Sepsis Labs: @LABRCNTIP (procalcitonin:4,lacticidven:4)  ) Recent Results (from the past 240 hour(s))  Culture, blood (routine x 2) Call MD if unable to obtain prior to antibiotics being given     Status: None (Preliminary result)   Collection Time: 09/14/17  9:52 PM  Result Value Ref Range Status   Specimen Description BLOOD RIGHT HAND  Final   Special Requests   Final    BOTTLES DRAWN AEROBIC AND ANAEROBIC Blood Culture adequate volume   Culture   Final    NO GROWTH 3 DAYS Performed at [facility], [address], [place], [place] [address]    Report Status PENDING  Incomplete  Culture, blood (routine x 2) Call MD if unable to obtain prior to antibiotics being given     Status: None (Preliminary result)   Collection Time: 09/14/17  9:53 PM  Result Value Ref Range Status   Specimen Description BLOOD RIGHT HAND  Final   Special Requests   Final    BOTTLES DRAWN AEROBIC ONLY Blood Culture adequate volume   Culture   Final    NO GROWTH 3 DAYS Performed at [facility], [address] [place], [place] [address]    Report Status PENDING  Incomplete  Respiratory Panel by PCR     Status: Abnormal   Collection Time: 09/14/17 10:41 PM  Result Value Ref Range Status   Adenovirus NOT DETECTED NOT DETECTED Final   Coronavirus 229E NOT DETECTED NOT DETECTED Final   Coronavirus HKU1 NOT DETECTED NOT DETECTED Final   Coronavirus NL63 NOT DETECTED NOT DETECTED Final   Coronavirus OC43 NOT DETECTED NOT DETECTED Final   Metapneumovirus NOT DETECTED NOT DETECTED Final   Rhinovirus / Enterovirus DETECTED (A) NOT DETECTED Final   Influenza A NOT DETECTED NOT DETECTED Final   Influenza B NOT DETECTED NOT DETECTED Final   Parainfluenza Virus 1 NOT DETECTED NOT DETECTED Final   Parainfluenza Virus 2 NOT DETECTED NOT DETECTED Final   Parainfluenza Virus 3 NOT DETECTED  NOT DETECTED Final   Parainfluenza Virus 4 NOT DETECTED NOT DETECTED Final   Respiratory Syncytial Virus NOT DETECTED NOT DETECTED Final   Bordetella pertussis NOT DETECTED NOT DETECTED Final   Chlamydophila pneumoniae NOT DETECTED NOT DETECTED Final   Mycoplasma pneumoniae NOT DETECTED NOT DETECTED Final    Comment: Performed at [facility], [address] [place], [place] [address]  MRSA PCR Screening     Status: None   Collection Time: 09/14/17 11:55 PM  Result Value Ref Range Status   MRSA by PCR NEGATIVE NEGATIVE Final    Comment:        The GeneXpert MRSA Assay (FDA approved for NASAL specimens only), is one component of a comprehensive MRSA  colonization surveillance program. It is not intended to diagnose MRSA infection nor to guide or monitor treatment for MRSA infections. Performed at [facility], [address], [place], [place] [address]       Radiology Studies: No results found.   Scheduled Meds: . allopurinol  200 mg Oral Daily  . aspirin EC  81 mg Oral Daily  . atorvastatin  80 mg Oral q1800  . dicyclomine  10 mg Oral TID  . escitalopram  20 mg Oral Daily  . heparin  5,000 Units Subcutaneous Q8H  . lisinopril  2.5 mg Oral Daily  . methylPREDNISolone (SOLU-MEDROL) injection  40 mg Intravenous Q24H  . metoprolol succinate  12.5 mg Oral Daily  . multivitamin with minerals  1 tablet Oral Daily  . pantoprazole  40 mg Oral BID  . protein supplement shake  11 oz Oral BID BM  . sodium chloride flush  3 mL Intravenous Q12H   Continuous Infusions: . sodium chloride       LOS: 4 days   Time Spent in minutes   45 minutes  Haneen Bernales D.O. on 09/18/2017 at 12:55 PM  Between 7am to 7pm - Pager - [phone]  After 7pm go to [email or web address] - password [email or web address]  And look for the night coverage person covering for me after hours  [facility] Office  [phone]

## 2017-09-18 NOTE — Progress Notes (Signed)
Progress Note  Patient Name: Veronica Wolf Date of Encounter: 09/18/2017  Primary Cardiologist: Garwin Brothersajan R Revankar, MD   Subjective   Breathing has improved.   Inpatient Medications    Scheduled Meds: . allopurinol  200 mg Oral Daily  . aspirin EC  81 mg Oral Daily  . atorvastatin  80 mg Oral q1800  . dicyclomine  10 mg Oral TID  . escitalopram  20 mg Oral Daily  . furosemide  80 mg Intravenous Q12H  . heparin  5,000 Units Subcutaneous Q8H  . lisinopril  2.5 mg Oral Daily  . methylPREDNISolone (SOLU-MEDROL) injection  40 mg Intravenous Q24H  . multivitamin with minerals  1 tablet Oral Daily  . pantoprazole  40 mg Oral BID  . protein supplement shake  11 oz Oral BID BM  . sodium chloride flush  3 mL Intravenous Q12H   Continuous Infusions: . sodium chloride     PRN Meds: sodium chloride, acetaminophen, ALPRAZolam, alum & mag hydroxide-simeth, bismuth subsalicylate, dextromethorphan-guaiFENesin, diazepam, hydrALAZINE, levalbuterol, meclizine, morphine injection, nitroGLYCERIN, nystatin, ondansetron (ZOFRAN) IV, sodium chloride flush, zolpidem   Vital Signs    Vitals:   09/18/17 0015 09/18/17 0401 09/18/17 1143 09/18/17 1146  BP: (!) 90/58 136/78  (!) 138/113  Pulse: 78 73  72  Resp: 18 18  17   Temp: (!) 97.5 F (36.4 C) 97.6 F (36.4 C) 97.8 F (36.6 C)   TempSrc: Oral Oral Oral   SpO2: 98% 93%  94%  Weight:  197 lb 14.4 oz (89.8 kg)    Height:        Intake/Output Summary (Last 24 hours) at 09/18/2017 1200 Last data filed at 09/18/2017 0924 Gross per 24 hour  Intake 583 ml  Output 4925 ml  Net -4342 ml   Filed Weights   09/14/17 1503 09/17/17 0344 09/18/17 0401  Weight: 190 lb (86.2 kg) 209 lb 14.1 oz (95.2 kg) 197 lb 14.4 oz (89.8 kg)    Telemetry    SR - Personally Reviewed  ECG    na  Physical Exam   GEN: No acute distress.   Neck: No JVD Cardiac: RRR, no murmurs, rubs, or gallops.  Respiratory:mild bilateral wheezing. GI: Soft, nontender,  non-distended  MS: No edema; No deformity. Neuro:  Nonfocal  Psych: Normal affect   Labs    Chemistry Recent Labs  Lab 09/14/17 1510  09/16/17 1239 09/17/17 0642 09/18/17 0719  NA 141   < > 140 139 135  K 3.2*   < > 4.8 5.0 5.2*  CL 105   < > 111 106 98*  CO2 25   < > 20* 24 28  GLUCOSE 105*   < > 103* 121* 76  BUN 24*   < > 35* 32* 57*  CREATININE 1.44*   < > 1.48* 1.57* 1.89*  CALCIUM 8.6*   < > 8.3* 8.3* 8.6*  PROT 7.3  --   --   --   --   ALBUMIN 3.9  --   --   --   --   AST 20  --   --   --   --   ALT 15  --   --   --   --   ALKPHOS 62  --   --   --   --   BILITOT 0.4  --   --   --   --   GFRNONAA 34*   < > 33* 30* 24*  GFRAA 39*   < >  38* 35* 28*  ANIONGAP 11   < > 9 9 9    < > = values in this interval not displayed.     Hematology Recent Labs  Lab 09/16/17 0423 09/17/17 0642 09/18/17 0719  WBC 15.4* 11.5* 20.0*  RBC 3.68* 3.96 4.83  HGB 10.2* 10.9* 13.5  HCT 33.3* 35.5* 42.9  MCV 90.5 89.6 88.8  MCH 27.7 27.5 28.0  MCHC 30.6 30.7 31.5  RDW 16.4* 16.2* 16.5*  PLT 211 201 240    Cardiac Enzymes Recent Labs  Lab 09/14/17 1510 09/14/17 2152 09/15/17 0320 09/15/17 1220  TROPONINI 0.66* 1.76* 1.60* 0.93*   No results for input(s): TROPIPOC in the last 168 hours.   BNP Recent Labs  Lab 09/14/17 1510  BNP 162.0*     DDimer No results for input(s): DDIMER in the last 168 hours.   Radiology    No results found.  Cardiac Studies     Patient Profile     77 y.o. female withCOPD, chronic kidney disease(stage 3),GERD, gout, anxiety,hypertension and hyperlipidemia. For the past 2 weeks she has noticed increasing shortness of breath and cough productive of mild phlegm. She denies any fevers or chills. She does not have any orthopnea or PND. She admitted to having some left sided chest pain. It is sharp and exacerbates with deep inspiration. She also complains of lower back discomfort. The chest pain is mostly constant with no particular  relieving factors. She reports having a lot of stress at home due to family issues over the past 2-3 days.In March 2019 the patient was evaluated for chest pain radiating to the shoulders. She underwent a cardiac catheterization procedure that revealed an EF of 55-60% and non-obstructive CAD. Now with + troponins    Assessment & Plan    1. Elevated troponin/Stress induced CM - Troponin pk 1.76 - Abnormal EKG with diffuse T wave inversions inf. And ant. Lat .  -cardiac cath with minimal non obstructive disease.  Mod to severe LV systolic dysfunction, moderate pulmonary hypertension.   - suspect stress induced CM. Discrepancy between LVEF by LVgram and echo, by review of echo I agree LVEF 55-60% - continue lisinopril, start low dose beta blocker Toprol XL once wheezing has improved RHC (mean PA 41, PCWP 29, CI 2.8). Being diuresed. Pulm HTN consistent with left sided heart disease. - negative 3.9L yesterday, negative 3.5 L since admission. Uptrend in Cr, hold diuretics.   - start Toprol 12.5mg  today. Hold diuretics and follow renal function.   2. AKI - hold diuretics today - continue low dose ACE-I at this time.   3. COPD exacerbation - per primary team    For questions or updates, please contact [facility] Please consult [email or web address] for contact info under Cardiology/STEMI.      Joanie Coddington, MD  09/18/2017, 12:00 PM

## 2017-09-19 ENCOUNTER — Other Ambulatory Visit: Payer: Self-pay | Admitting: Medical

## 2017-09-19 ENCOUNTER — Encounter (HOSPITAL_COMMUNITY): Payer: Self-pay | Admitting: Cardiology

## 2017-09-19 ENCOUNTER — Ambulatory Visit: Payer: Medicare HMO | Admitting: Family

## 2017-09-19 ENCOUNTER — Inpatient Hospital Stay (HOSPITAL_COMMUNITY): Payer: Medicare HMO

## 2017-09-19 ENCOUNTER — Telehealth: Payer: Self-pay | Admitting: Family

## 2017-09-19 DIAGNOSIS — N183 Chronic kidney disease, stage 3 unspecified: Secondary | ICD-10-CM

## 2017-09-19 DIAGNOSIS — E876 Hypokalemia: Secondary | ICD-10-CM

## 2017-09-19 DIAGNOSIS — I5181 Takotsubo syndrome: Secondary | ICD-10-CM

## 2017-09-19 DIAGNOSIS — J441 Chronic obstructive pulmonary disease with (acute) exacerbation: Secondary | ICD-10-CM

## 2017-09-19 LAB — CBC
HCT: 39.4 % (ref 36.0–46.0)
HEMOGLOBIN: 12.5 g/dL (ref 12.0–15.0)
MCH: 28.2 pg (ref 26.0–34.0)
MCHC: 31.7 g/dL (ref 30.0–36.0)
MCV: 88.9 fL (ref 78.0–100.0)
Platelets: 259 10*3/uL (ref 150–400)
RBC: 4.43 MIL/uL (ref 3.87–5.11)
RDW: 16.2 % — ABNORMAL HIGH (ref 11.5–15.5)
WBC: 20.5 10*3/uL — AB (ref 4.0–10.5)

## 2017-09-19 LAB — BASIC METABOLIC PANEL
Anion gap: 12 (ref 5–15)
BUN: 63 mg/dL — ABNORMAL HIGH (ref 6–20)
CALCIUM: 8.4 mg/dL — AB (ref 8.9–10.3)
CO2: 26 mmol/L (ref 22–32)
Chloride: 100 mmol/L — ABNORMAL LOW (ref 101–111)
Creatinine, Ser: 1.77 mg/dL — ABNORMAL HIGH (ref 0.44–1.00)
GFR calc Af Amer: 31 mL/min — ABNORMAL LOW (ref >60)
GFR calc non Af Amer: 26 mL/min — ABNORMAL LOW (ref >60)
Glucose, Bld: 102 mg/dL — ABNORMAL HIGH (ref 65–99)
Potassium: 3.8 mmol/L (ref 3.5–5.1)
SODIUM: 138 mmol/L (ref 135–145)

## 2017-09-19 MED ORDER — METOPROLOL SUCCINATE ER 25 MG PO TB24: 12.5000 mg | ORAL_TABLET | Freq: Every day | ORAL | 0 refills | Status: DC

## 2017-09-19 MED ORDER — PREDNISONE 10 MG PO TABS: ORAL_TABLET | ORAL | 0 refills | Status: DC

## 2017-09-19 MED ORDER — LISINOPRIL 2.5 MG PO TABS: 2.5000 mg | ORAL_TABLET | Freq: Every day | ORAL | 0 refills | Status: DC

## 2017-09-19 MED ORDER — ADULT MULTIVITAMIN W/MINERALS CH: 1.0000 | ORAL_TABLET | Freq: Every day | ORAL | Status: DC

## 2017-09-19 MED ORDER — ATORVASTATIN CALCIUM 40 MG PO TABS: 40.0000 mg | ORAL_TABLET | Freq: Every day | ORAL | 0 refills | Status: DC

## 2017-09-19 MED FILL — Heparin Sod (Porcine)-NaCl IV Soln 1000 Unit/500ML-0.9%: INTRAVENOUS | Qty: 1000 | Status: AC

## 2017-09-19 NOTE — Consult Note (Signed)
   [facility]   09/19/2017  Veronica Wolf [birth year] [id number]  Patient evaluated for community based chronic disease management services with [facility] as a benefit of [facility]'s [facility]. Patient is high risk scored for unplanned readmissions (28%).  Admitted with NSTEMI, HX includes but not limited to COPD exacerbation as well.  Spoke with patient at bedside to explain [facility]. Patient states she lives alone but she drives to her appointments.  She uses [facility] for pharmacy near [place].  Patient states she sometimes forget her medications because of not using her pill boxes like she should.  Consent form signed for care and disease management.     Patient will receive post hospital discharge call and will be evaluated for monthly home visits for assessments and disease process education. Patient states that her primary care provider is Sandford CrazeMelissa O'Sullivan [facility].  This practice is listed to provide the transition of care follow up after transition hoome.   Left contact information and [facility] literature at bedside. Made Inpatient Case Manager aware that [facility] following. Of note, [facility] services does not replace or interfere with any services that are arranged by inpatient case management or social work.  For additional questions or referrals please contact:    Charlesetta ShanksVictoria Autumne Kallio, RN BSN CCM [facility] Liaison  [phone] business mobile phone Toll free office [phone]

## 2017-09-19 NOTE — Progress Notes (Addendum)
Progress Note  Patient Name: CASADY VOSHELL Date of Encounter: 09/19/2017  Primary Cardiologist: Garwin Brothers, MD   Subjective   Slept well last night breathing issues.  No chest pain or dyspnea with exertion. Hoping to go home  Inpatient Medications    Scheduled Meds: . allopurinol  200 mg Oral Daily  . aspirin EC  81 mg Oral Daily  . atorvastatin  80 mg Oral q1800  . dicyclomine  10 mg Oral TID  . escitalopram  20 mg Oral Daily  . heparin  5,000 Units Subcutaneous Q8H  . lisinopril  2.5 mg Oral Daily  . metoprolol succinate  12.5 mg Oral Daily  . multivitamin with minerals  1 tablet Oral Daily  . pantoprazole  40 mg Oral BID  . predniSONE  40 mg Oral Q breakfast  . protein supplement shake  11 oz Oral BID BM  . sodium chloride flush  3 mL Intravenous Q12H   Continuous Infusions: . sodium chloride     PRN Meds: sodium chloride, acetaminophen, ALPRAZolam, alum & mag hydroxide-simeth, bismuth subsalicylate, dextromethorphan-guaiFENesin, diazepam, hydrALAZINE, levalbuterol, meclizine, morphine injection, nitroGLYCERIN, nystatin, ondansetron (ZOFRAN) IV, sodium chloride flush, zolpidem   Vital Signs    Vitals:   09/18/17 1143 09/18/17 1146 09/18/17 1951 09/19/17 0508  BP:  (!) 138/113 (!) 148/80 133/67  Pulse:  72 86 75  Resp:  17 19 18   Temp: 97.8 F (36.6 C)  98.3 F (36.8 C) (!) 97.5 F (36.4 C)  TempSrc: Oral  Oral Oral  SpO2:  94% 97% 97%  Weight:    196 lb 6.4 oz (89.1 kg)  Height:        Intake/Output Summary (Last 24 hours) at 09/19/2017 0925 Last data filed at 09/19/2017 0600 Gross per 24 hour  Intake 800 ml  Output 1150 ml  Net -350 ml   Filed Weights   09/17/17 0344 09/18/17 0401 09/19/17 0508  Weight: 209 lb 14.1 oz (95.2 kg) 197 lb 14.4 oz (89.8 kg) 196 lb 6.4 oz (89.1 kg)    Telemetry    Sinus rhythm rates in 60s- Personally Reviewed  ECG    Not checked- Personally Reviewed  Physical Exam   GEN: No acute distress.  Pleasant mood  and affect Neck: No JVD Cardiac: RRR, no murmurs, rubs, or gallops.  Normal S1-S2 Respiratory: Clear to auscultation bilaterally.  Nonlabored.  Good air movement. GI: Soft, nontender, non-distended  MS: No edema; No deformity. Neuro:  Nonfocal  Psych: Normal affect   Labs    Chemistry Recent Labs  Lab 09/14/17 1510  09/17/17 0642 09/18/17 0719 09/19/17 0439  NA 141   < > 139 135 138  K 3.2*   < > 5.0 5.2* 3.8  CL 105   < > 106 98* 100*  CO2 25   < > 24 28 26   GLUCOSE 105*   < > 121* 76 102*  BUN 24*   < > 32* 57* 63*  CREATININE 1.44*   < > 1.57* 1.89* 1.77*  CALCIUM 8.6*   < > 8.3* 8.6* 8.4*  PROT 7.3  --   --   --   --   ALBUMIN 3.9  --   --   --   --   AST 20  --   --   --   --   ALT 15  --   --   --   --   ALKPHOS 62  --   --   --   --  BILITOT 0.4  --   --   --   --   GFRNONAA 34*   < > 30* 24* 26*  GFRAA 39*   < > 35* 28* 31*  ANIONGAP 11   < > 9 9 12    < > = values in this interval not displayed.     Hematology Recent Labs  Lab 09/17/17 0642 09/18/17 0719 09/19/17 0439  WBC 11.5* 20.0* 20.5*  RBC 3.96 4.83 4.43  HGB 10.9* 13.5 12.5  HCT 35.5* 42.9 39.4  MCV 89.6 88.8 88.9  MCH 27.5 28.0 28.2  MCHC 30.7 31.5 31.7  RDW 16.2* 16.5* 16.2*  PLT 201 240 259    Cardiac Enzymes Recent Labs  Lab 09/14/17 1510 09/14/17 2152 09/15/17 0320 09/15/17 1220  TROPONINI 0.66* 1.76* 1.60* 0.93*   No results for input(s): TROPIPOC in the last 168 hours.   BNP Recent Labs  Lab 09/14/17 1510  BNP 162.0*     DDimer No results for input(s): DDIMER in the last 168 hours.   Radiology    Dg Chest 2 View  Result Date: 09/19/2017 CLINICAL DATA:  Leukocytosis. EXAM: CHEST - 2 VIEW COMPARISON:  09/14/2017, 09/07/2017 and 06/07/2017 FINDINGS: Again noted are densities along the anterior lower chest, seen on the lateral view. Findings are similar to the exam on 06/07/2017 and probably represent chronic volume loss and scarring. Otherwise, the lungs are clear.  Heart and mediastinum are within normal limits. Trachea is midline. Mild degenerative endplate changes in the thoracic spine. No large pleural effusions. IMPRESSION: No active cardiopulmonary disease. Chronic volume loss or scarring in the anterior lower chest. Electronically Signed   By: Richarda Overlie M.D.   On: 09/19/2017 07:44    Cardiac Studies    2 D Echo September 15, 2017: EF 55 to 60% with mild hypokinesis of the mid apical lateral-inferolateral wall.  GR 1 DD.  Moderately elevated PA pressures.  Cardiac Cath September 16, 2017: pRCA & rPAV 20%.  Moderate pulmonary Venous hypertension -secondary to LVEDP 32 mmHg consistent with PCWP of 30 mmHg.Marland Kitchen    Patient Profile     77 y.o. female with known COPD, CKD 3, hypertension hyperlipidemia who presented with worsening shortness of breath and productive cough.  She was admitted some left-sided chest pain exacerbated with deep inspiration.  Also had lots of stress at home.  As part of her admission evaluation she had troponin levels that were checked and positive at 1.76 max.  She underwent echocardiogram showing normal EF, but elevated pulmonary pressures and possible inferolateral hypokinesis.  Subsequently underwent cath that showed normal coronaries with significant elevated LVEDP suggesting stress-induced cardiomyopathy.   Assessment & Plan    Principal Problem:   Chest pain -- Elevated troponin/  Non-ST elevation (NSTEMI) myocardial infarction [facility]) - due to Demand Infarction with  Stress-induced cardiomyopathy  Cardiac cath clearly showed no evidence of ischemia, but severely elevated LVEDP and pulmonary pressures  EF seems to be preserved by LV gram although Reported moderately reduced EF.  Now on low-dose beta-blocker and low-dose ACE inhibitor  Diuretics being held currently for worsening renal function - can probably convert to p.o. 40 mg daily tomorrow after discharge    COPD exacerbation (HCC) -per primary team.  I suspect that this may  been the initial inciting event for her cardia myopathy.     CKD (chronic kidney disease), stage III (HCC)      HLD (hyperlipidemia) --continue high-dose atorvastatin and the setting of elevated troponin  Essential hypertension -relatively well controlled with low-dose Toprol and ACE inhibitor.  May very well be L to titrate up as an outpatient.    Hypokalemia -interspersed with hyperkalemia.  Will probably need potassium supplementation as an outpatient. -  Would benefit from outpatient BMP check to be reviewed by PCP & Dr. Tomie Chinaevankar     Anticipate that she be ready for discharge today.  Discussed with primary hospitalist - Edsel PetrinMikhail, Maryann, DO   For questions or updates, please contact [facility] Please consult [email or web address] for contact info under Cardiology/STEMI.      Signed, Bryan Lemmaavid Even Budlong, MD  09/19/2017, 9:25 AM

## 2017-09-19 NOTE — Progress Notes (Signed)
SATURATION QUALIFICATIONS: (This note is used to comply with regulatory documentation for home oxygen)  Patient Saturations on Room Air at Rest = 97%  Patient Saturations on Room Air while Ambulating = 93%  Pt did not require Oxygen while ambulating.

## 2017-09-19 NOTE — Telephone Encounter (Signed)
[facility]ll call patient [facility]. Discharged today.

## 2017-09-19 NOTE — Care Management Important Message (Signed)
Important Message  Patient Details  Name: Veronica Wolf MRN: [id number] Date of Birth: [birth year]   Medicare Important Message Given:  Yes    Elliot CousinShavis, Janet Decesare Ellen, RN 09/19/2017, 12:11 PM

## 2017-09-19 NOTE — Discharge Instructions (Signed)
Nonspecific Chest Pain Chest pain can be caused by many different conditions. There is a chance that your pain could be related to something serious, such as a heart attack or a blood clot in your lungs. Chest pain can also be caused by conditions that are not life-threatening. If you have chest pain, it is very important to follow up with your doctor. Follow these instructions at home: Medicines  If you were prescribed an antibiotic medicine, take it as told by your doctor. Do not stop taking the antibiotic even if you start to feel better.  Take over-the-counter and prescription medicines only as told by your doctor. Lifestyle  Do not use any products that contain nicotine or tobacco, such as cigarettes and e-cigarettes. If you need help quitting, ask your doctor.  Do not drink alcohol.  Make lifestyle changes as told by your doctor. These may include: ? Getting regular exercise. Ask your doctor for some activities that are safe for you. ? Eating a heart-healthy diet. A diet specialist (dietitian) can help you to learn healthy eating options. ? Staying at a healthy weight. ? Managing diabetes, if needed. ? Lowering your stress, as with deep breathing or spending time in nature. General instructions  Avoid any activities that make you feel chest pain.  If your chest pain is because of heartburn: ? Raise (elevate) the head of your bed about 6 inches (15 cm). You can do this by putting blocks under the bed legs at the head of the bed. ? Do not sleep with extra pillows under your head. That does not help heartburn.  Keep all follow-up visits as told by your doctor. This is important. This includes any further testing if your chest pain does not go away. Contact a doctor if:  Your chest pain does not go away.  You have a rash with blisters on your chest.  You have a fever.  You have chills. Get help right away if:  Your chest pain is worse.  You have a cough that gets worse, or  you cough up blood.  You have very bad (severe) pain in your belly (abdomen).  You are very weak.  You pass out (faint).  You have either of these for no clear reason: ? Sudden chest discomfort. ? Sudden discomfort in your arms, back, neck, or jaw.  You have shortness of breath at any time.  You suddenly start to sweat, or your skin gets clammy.  You feel sick to your stomach (nauseous).  You throw up (vomit).  You suddenly feel light-headed or dizzy.  Your heart starts to beat fast, or it feels like it is skipping beats. These symptoms may be an emergency. Do not wait to see if the symptoms will go away. Get medical help right away. Call your local emergency services (911 in the [place] Do not drive yourself to the hospital. This information is not intended to replace advice given to you by your health care provider. Make sure you discuss any questions you have with your health care provider. Document Released: 09/08/2007 Document Revised: 12/15/2015 Document Reviewed: 12/15/2015 Elsevier Interactive Patient Education  2017 [employer] Pulmonary Hypertension Pulmonary hypertension is high blood pressure within the arteries in your lungs (pulmonary arteries). It is different than having high blood pressure elsewhere in your body, such as blood pressure that is measured with a blood pressure cuff. Pulmonary hypertension makes it harder for blood to flow through the lungs. As a result, the heart must work harder  to pump blood through the lungs, and it may be harder for you to breathe. Over time, this can weaken the heart muscle. Pulmonary hypertension is a serious condition and it can be fatal. What are the causes? Many different medical conditions can cause pulmonary hypertension. Pulmonary hypertension can be categorized by cause into five groups: Group 1 Pulmonary hypertension that is caused by abnormal growth of small blood vessels in the lungs (pulmonary arterial hypertension).  The abnormal blood vessel growth may have no known cause, or it may be:  Passed along from a parent (hereditary).  Caused by another disease, such as a connective tissue disease (including lupus or scleroderma) or HIV.  Caused by certain drugs or toxins.  Group 2 Pulmonary hypertension that is caused by weakness of the main chamber of the heart (left ventricle) or heart valve disease. Group 3 Pulmonary hypertension that is caused by lung disease or low oxygen levels. Causes in this group include:  Emphysema or chronic obstructive pulmonary disease (COPD).  Untreated sleep apnea.  Pulmonary fibrosis.  Group 4 Pulmonary hypertension that is caused by blood clots in the lungs (pulmonary emboli). Group 5 Other causes of pulmonary hypertension, such as sickle cell anemia, or a mix of multiple causes. What are the signs or symptoms? Symptoms of this condition include:  Shortness of breath. You may notice shortness of breath with: ? Activity, such as walking. ? No activity.  Tiredness and fatigue.  Dizziness or fainting.  Rapid heartbeat or feeling your heart flutter or skip a beat (palpitations).  Neck vein enlargement.  Bluish color to your lips and fingertips.  How is this diagnosed? This condition may be diagnosed by:  Chest X-ray.  Arterial blood gases. This test checks the acidity of your blood as well as your blood oxygen and carbon dioxide levels.  CT scan. This test can provide detailed images of your lungs.  Pulmonary function test. This test measures how much air your lungs can hold. It also tests how well air moves in and out of your lungs.  Electrocardiogram (ECG). This test traces the electrical activity of your heart.  Echocardiogram. This test is used to look at your heart in motion and check how it is functioning.  Heart catheterization. This test can measure the pressure in your pulmonary artery and the right side of your heart.  Lung biopsy. This  procedure involves checking a sample of lung tissue to find underlying causes.  How is this treated? There is no cure for pulmonary hypertension, but treatment can help to relieve symptoms and slow the progress of the condition. Treatment can involve:  Medicines, such as: ? Blood pressure medicines. ? Medicines to relax (dilate) the pulmonary blood vessels. ? Water pills to get rid of extra fluid (diuretic medicines). ? Blood-thinning medicines.  Surgery. For severe pulmonary hypertension that does not respond to medical treatment, heart-lung or lung transplant may be needed.  Follow these instructions at home:  Take medicines only as directed by your health care provider. These include over-the-counter medicines and prescription medicines. Take all medicines exactly as instructed. Do not change or stop medicines without first checking with your health care provider.  Do not smoke. If you need help quitting, ask your health care provider.  Eat a healthy diet.  Limit your salt (sodium) intake to less than 2,300 mg per day.  Stay as active as possible. Exercise as directed by your health care provider. Talk with your health care provider about what type of exercise  is safe for you.  Avoid high altitudes.  Avoid hot tubs and saunas.  Avoid becoming pregnant, if this applies. Talk with your health care provider about safe methods of birth control.  Keep all follow-up visits as directed by your health care provider. This is important. Get help right away if:  You have severe shortness of breath.  You develop chest pain or pressure in your chest.  You cough up blood.  You develop swelling of your feet or legs.  You have a significant increase in weight within 1-2 days. This information is not intended to replace advice given to you by your health care provider. Make sure you discuss any questions you have with your health care provider. Document Released: 01/17/2007 Document  Revised: 10/10/2015 Document Reviewed: 09/11/2012 Elsevier Interactive Patient Education  2018 [employer].

## 2017-09-19 NOTE — Telephone Encounter (Signed)
Copied from CRM [phone]. Topic: Quick Communication - Appointment Cancellation >> Sep 19, 2017 12:39 PM Raoul PitchWilliams-Neal, Mamie NickSade R wrote: Patient called to state that she is in the hospital the reason why she missed her appointment this morning. Pt didn't want to reschedule states she will be getting gout of hosp today .Route to department's PEC pool.

## 2017-09-19 NOTE — Care Management Note (Signed)
Case Management Note  Patient Details  Name: Juleen ChinaLois P Derhammer MRN: [id number] Date of Birth: September 19, 1940  Subjective/Objective:  COPD exac with 1 hospital stay within six months and 2 ED visits. [facility] lives [facility], has [employer] [facility] CM                  Action/Plan: No NCM needs identified. She is independent at home. Still drives to her appts.   Expected Discharge Date:  09/19/17               Expected Discharge Plan:  Home/Self Care  [facility]  Discharge planning Services  CM Consult  Post Acute Care Choice:  NA Choice offered to:  NA  DME Arranged:  N/A DME Agency:  NA  HH Arranged:  NA HH Agency:  NA  Status of Service:  Completed, signed off  If discussed at Long Length of Stay Meetings, dates discussed:    Additional Comments:  Elliot CousinShavis, Ryana Montecalvo Ellen, RN 09/19/2017, 12:14 PM

## 2017-09-19 NOTE — Discharge Summary (Signed)
Physician Discharge Summary  MARGRIT MINNER ZOX:[id number] DOB: Nov 16, 1940 DOA: 09/14/2017  PCP: [name], NP  Admit date: 09/14/2017 Discharge date: 09/19/2017  Time spent: 45 minutes  Recommendations for Outpatient Follow-up:  Patient will be discharged to home.  Patient will need to follow up with primary care provider within one week of discharge, repeat CBC and CMP. Follow up with cardiologist.  Patient should continue medications as prescribed.  Patient should follow a heart healthy diet.   Discharge Diagnoses:  Chest pain/NSTEMI Moderate pulmonary hypertension COPD exacerbation secondary to rhinovirus Leukocytosis Epigastric abdominal pain/GERD/peptic ulcer disease Hyperlipidemia Gout Acute kidney injury on Chronic kidney disease, stage III Essential hypertension Hypokalemia/Hyperkalemia Depression/anxiety Chronic normocytic anemia  Discharge Condition: Stable  Diet recommendation: heart healthy   Filed Weights   09/17/17 0344 09/18/17 0401 09/19/17 0508  Weight: 95.2 kg (209 lb 14.1 oz) 89.8 kg (197 lb 14.4 oz) 89.1 kg (196 lb 6.4 oz)    History of present illness:  09/14/2017 by Dr. Arcelia Jew P Harrisis a 77 y.o.femalewith medical history significant ofhypertension, hyperlipidemia, COPD, GERD, gout, depression with anxiety, CKD 3, peptic ulcer disease, who presents with chest pain, shortness of breath, cough, epigastric abdominal pain.  Patient states that she started having cough and shortness of breath this afternoon, which has been progressively getting worse. She has a little mucus production. No fever or chills. She also reports chest pain at the same times. The chest pain is located in the substernal area,constant, 8 out of 10 severity, pressure-like, nonradiating. It is not pleuritic, not aggravated by deep breath or coughing. No tenderness in the calf areas. No recent long distance traveling. Patient did not have abdominal pain at  home, but she started having epigastric abdominal pain in the hospital.It is constant, 8 out of 10 severity, nonradiating. Not associated with nausea vomiting or diarrhea. No hematochezia or hematemesis. Patient denies symptoms of UTI or unilateral weakness. Patient was found to have an elevated troponin 0 0.66. The ED physician discussed with on-call cardiologist (they did not writedown cardiologist/sname), who recommended to start patient with IV heparin.Pt states that she has a lot stress due to family issues recently.  Hospital Course:  Chest pain/NSTEMI -Troponin 0.66 on admission, peaked at 1.76 -patient had persistent Chest pain vs demand ischemia -Cardiology consulted and appreciated -was placed on IV heparin -continue lisinopril, statin, aspirin -Echocardiogram: 55 to 60%, grade 1 diastolic dysfunction.  Pulmonary PA peak pressure 62 mmHg -Status post cardiac catheterization: Small RCA 20% stenosis, posterior atrial lesion 20% stenosis.  Moderate to severe left ventricular systolic dysfunction.  Hemodynamic findings consistent with moderate pulmonary hypertension.  No significant CAD.  Normal cardiac output.  Recommend IV diuresis. -Cardiology started low dose metoprolol and recommended oral lasix 40mg  daily with outpatient cardiology follow up  Moderate pulmonary hypertension -noted on cath as stated above -as above, patient was diuresed -cardiology recommended lasix 40mg  daily  COPD exacerbation secondary to rhinovirus -Chest x-ray: Unremarkable for infection -Continue steroids, nebulizer treatments, Mucinex -Completed azithromycin -Respiratory viral panel positive for rhinovirus/enterovirus -Blood cultures show no growth to date -strep pneumonia urine antigen negative -able to ambulate on room air and maintain O2 saturations 93%  Leukocytosis -Suspect secondary to steroids and reactive -currently, patient denies any cough or congestion/upper respiratory symptoms  or  dysuria -Repeat CXR today shows improvement, no infiltrate seen -blood cultures show no growth to date -Repeat CBC in one week  Epigastric abdominal pain/GERD/peptic ulcer disease -Continue Protonix  Hyperlipidemia -Continue statin -repeat CMP  in one week  Gout -Continue allopurinol  Acute kidney injury on Chronic kidney disease, stage III -Creatinine 1.2-1.4, currently 1.77 -suspect secondary to diuresis, which has been held for today -Repeat BMP in one week  Essential hypertension -Continue lisinopril, metoprolol   Hypokalemia/Hyperkalemia -resolved,  -K currently 3.8 -repeat BMP in one week  Depression/anxiety -Continue Lexapro, Valium, Xanax as needed  Chronic normocytic anemia -reviewing patient's chart, hemoglobin has ranged from 10-12 (baseline) -stable, Currently hemoglobin 12.5   Consultants Cardiology  Procedures  Echocardiogram Right/left heart cath and coronary angiography  Discharge Exam: Vitals:   09/18/17 1951 09/19/17 0508  BP: (!) 148/80 133/67  Pulse: 86 75  Resp: 19 18  Temp: 98.3 F (36.8 C) (!) 97.5 F (36.4 C)  SpO2: 97% 97%   Feeling better today. Feels breathing has improved.  Denies chest pain, shortness of breath, abdominal pain, nausea or vomiting, diarrhea constipation, dizziness or headache.   General: Well developed, well nourished, NAD, appears stated age  HEENT: NCAT, mucous membranes moist. Poor dentition   Neck: Supple  Cardiovascular: S1 S2 auscultated, no rubs, murmurs or gallops. Regular rate and rhythm.  Respiratory: Clear to auscultation bilaterally with equal chest rise, no wheezing   Abdomen: Soft, obese, nontender, nondistended, + bowel sounds  Extremities: warm dry without cyanosis clubbing or edema  Neuro: AAOx3, nonfocal    Psych: Normal affect and demeanor with intact judgement and insight  Discharge Instructions Discharge Instructions    (HEART FAILURE PATIENTS) Call MD:  Anytime  you have any of the following symptoms: 1) 3 pound weight gain in 24 hours or 5 pounds in 1 week 2) shortness of breath, with or without a dry hacking cough 3) swelling in the hands, feet or stomach 4) if you have to sleep on extra pillows at night in order to breathe.   Complete by:  As directed    Discharge instructions   Complete by:  As directed    Patient will be discharged to home.  Patient will need to follow up with primary care provider within one week of discharge, repeat CBC and CMP. Follow up with cardiologist.  Patient should continue medications as prescribed.  Patient should follow a heart healthy diet.     Allergies as of 09/19/2017      Reactions   Ibuprofen Other (See Comments)   Pt has hx of peptic ulcer and diverticulitis.       Medication List    TAKE these medications   acetaminophen 500 MG tablet Commonly known as:  TYLENOL Take 500-1,000 mg by mouth 2 (two) times daily as needed for headache.   albuterol (2.5 MG/3ML) 0.083% nebulizer solution Commonly known as:  PROVENTIL Take 3 mLs (2.5 mg total) by nebulization every 6 (six) hours as needed for wheezing or shortness of breath.   allopurinol 100 MG tablet Commonly known as:  ZYLOPRIM Take 2 tablets (200 mg total) by mouth daily.   aspirin EC 81 MG tablet Take 1 tablet (81 mg total) by mouth daily.   atorvastatin 40 MG tablet Commonly known as:  LIPITOR Take 1 tablet (40 mg total) by mouth daily at 6 PM.   bismuth subsalicylate 262 MG/15ML suspension Commonly known as:  PEPTO BISMOL Take 30 mLs by mouth every 6 (six) hours as needed for indigestion or diarrhea or loose stools.   diazepam 10 MG tablet Commonly known as:  VALIUM TAKE 1 TABLET BY MOUTH EVERY 12 HOURS AS NEEDED FOR ANXIETY   dicyclomine 10 MG  capsule Commonly known as:  BENTYL Take 10 mg by mouth 3 (three) times daily.   escitalopram 20 MG tablet Commonly known as:  LEXAPRO Take 1 tablet (20 mg total) by mouth daily.     esomeprazole 20 MG capsule Commonly known as:  NEXIUM Take 20 mg by mouth daily as needed (GERD).   lisinopril 2.5 MG tablet Commonly known as:  PRINIVIL,ZESTRIL Take 1 tablet (2.5 mg total) by mouth daily. Start taking on:  09/20/2017 What changed:    medication strength  how much to take   meclizine 25 MG tablet Commonly known as:  ANTIVERT TAKE 1 TABLET BY MOUTH THREE TIMES DAILY AS NEEDED FOR  DIZZINESS   metoprolol succinate 25 MG 24 hr tablet Commonly known as:  TOPROL-XL Take 0.5 tablets (12.5 mg total) by mouth daily. Start taking on:  09/20/2017   multivitamin with minerals Tabs tablet Take 1 tablet by mouth daily. Start taking on:  09/20/2017   nitroGLYCERIN 0.4 MG SL tablet Commonly known as:  NITROSTAT Place 1 tablet (0.4 mg total) under the tongue every 5 (five) minutes as needed. What changed:  reasons to take this   nystatin powder Generic drug:  nystatin Apply 1 g topically 3 (three) times daily as needed (rash).   pantoprazole 40 MG tablet Commonly known as:  PROTONIX Take 1 tablet (40 mg total) by mouth daily.   predniSONE 10 MG tablet Commonly known as:  DELTASONE Take 3 tab x 2 days, then 2 tabs x 2 days, then 1 tab x 2 days. What changed:    medication strength  how much to take  how to take this  when to take this  additional instructions      Allergies  Allergen Reactions  . Ibuprofen Other (See Comments)    Pt has hx of peptic ulcer and diverticulitis.    Follow-up Information    [name], NP. Schedule an appointment as soon as possible for a visit in 1 week(s).   Specialty:  Internal Medicine Why:  Hospital follow up Contact information: [address] [place] [address] [phone]        Revankar, Aundra Dubin, MD .   Specialty:  Cardiology Contact information: [address] [place]  [place] [address] [phone]            The results of significant diagnostics from  this hospitalization (including imaging, microbiology, ancillary and laboratory) are listed below for reference.    Significant Diagnostic Studies: Dg Chest 2 View  Result Date: 09/19/2017 CLINICAL DATA:  Leukocytosis. EXAM: CHEST - 2 VIEW COMPARISON:  09/14/2017, 09/07/2017 and 06/07/2017 FINDINGS: Again noted are densities along the anterior lower chest, seen on the lateral view. Findings are similar to the exam on 06/07/2017 and probably represent chronic volume loss and scarring. Otherwise, the lungs are clear. Heart and mediastinum are within normal limits. Trachea is midline. Mild degenerative endplate changes in the thoracic spine. No large pleural effusions. IMPRESSION: No active cardiopulmonary disease. Chronic volume loss or scarring in the anterior lower chest. Electronically Signed   By: Richarda Overlie M.D.   On: 09/19/2017 07:44   Dg Chest 2 View  Result Date: 09/07/2017 CLINICAL DATA:  Left chest pain radiating to arm and jaw for 1 week. History of COPD, hypertension. EXAM: CHEST - 2 VIEW COMPARISON:  Chest radiograph June 07, 2017 FINDINGS: Cardiac silhouette is mildly enlarged unchanged. Mediastinal silhouette is not suspicious. Stable left lung base scarring.  No pleural effusion or focal consolidation. Increased lung volumes with flattened hemidiaphragms. No pneumothorax. Moderate degenerative change of the thoracic spine. IMPRESSION: Mild cardiomegaly. Mild hyperinflation without focal consolidation. Electronically Signed   By: Awilda Metro M.D.   On: 09/07/2017 18:43   Dg Chest Portable 1 View  Result Date: 09/14/2017 CLINICAL DATA:  Shortness of breath and chest pain EXAM: PORTABLE CHEST 1 VIEW COMPARISON:  September 07, 2017 FINDINGS: Mild cardiomegaly. The hila and mediastinum are normal. Mild interstitial prominence without overt edema. Mild bibasilar atelectasis. IMPRESSION: Cardiomegaly. Pulmonary venous congestion. Probable atelectasis in the bases. Electronically Signed   By: Gerome Sam III M.D   On: 09/14/2017 16:08    Microbiology: Recent Results (from the past 240 hour(s))  Culture, blood (routine x 2) Call MD if unable to obtain prior to antibiotics being given     Status: None (Preliminary result)   Collection Time: 09/14/17  9:52 PM  Result Value Ref Range Status   Specimen Description BLOOD RIGHT HAND  Final   Special Requests   Final    BOTTLES DRAWN AEROBIC AND ANAEROBIC Blood Culture adequate volume   Culture   Final    NO GROWTH 3 DAYS Performed at [facility], [address] [place], [place] [address]    Report Status PENDING  Incomplete  Culture, blood (routine x 2) Call MD if unable to obtain prior to antibiotics being given     Status: None (Preliminary result)   Collection Time: 09/14/17  9:53 PM  Result Value Ref Range Status   Specimen Description BLOOD RIGHT HAND  Final   Special Requests   Final    BOTTLES DRAWN AEROBIC ONLY Blood Culture adequate volume   Culture   Final    NO GROWTH 3 DAYS Performed at [facility], [address] [place], [place] [address]    Report Status PENDING  Incomplete  Respiratory Panel by PCR     Status: Abnormal   Collection Time: 09/14/17 10:41 PM  Result Value Ref Range Status   Adenovirus NOT DETECTED NOT DETECTED Final   Coronavirus 229E NOT DETECTED NOT DETECTED Final   Coronavirus HKU1 NOT DETECTED NOT DETECTED Final   Coronavirus NL63 NOT DETECTED NOT DETECTED Final   Coronavirus OC43 NOT DETECTED NOT DETECTED Final   Metapneumovirus NOT DETECTED NOT DETECTED Final   Rhinovirus / Enterovirus DETECTED (A) NOT DETECTED Final   Influenza A NOT DETECTED NOT DETECTED Final   Influenza B NOT DETECTED NOT DETECTED Final   Parainfluenza Virus 1 NOT DETECTED NOT DETECTED Final   Parainfluenza Virus 2 NOT DETECTED NOT DETECTED Final   Parainfluenza Virus 3 NOT DETECTED NOT DETECTED Final   Parainfluenza Virus 4 NOT DETECTED NOT DETECTED Final   Respiratory Syncytial Virus NOT DETECTED  NOT DETECTED Final   Bordetella pertussis NOT DETECTED NOT DETECTED Final   Chlamydophila pneumoniae NOT DETECTED NOT DETECTED Final   Mycoplasma pneumoniae NOT DETECTED NOT DETECTED Final    Comment: Performed at [facility], [address] [place], [place] [address]  MRSA PCR Screening     Status: None   Collection Time: 09/14/17 11:55 PM  Result Value Ref Range Status   MRSA by PCR NEGATIVE NEGATIVE Final    Comment:        The GeneXpert MRSA Assay (FDA approved for NASAL specimens only), is one component of a comprehensive MRSA colonization surveillance program. It is not intended to diagnose MRSA infection nor to  guide or monitor treatment for MRSA infections. Performed at [facility], [address], [place], [place] [address]      Labs: Basic Metabolic Panel: Recent Labs  Lab 09/16/17 0423 09/16/17 1239 09/17/17 0642 09/18/17 0719 09/19/17 0439  NA 136 140 139 135 138  K 5.3* 4.8 5.0 5.2* 3.8  CL 110 111 106 98* 100*  CO2 22 20* 24 28 26   GLUCOSE 117* 103* 121* 76 102*  BUN 38* 35* 32* 57* 63*  CREATININE 1.74* 1.48* 1.57* 1.89* 1.77*  CALCIUM 7.9* 8.3* 8.3* 8.6* 8.4*   Liver Function Tests: Recent Labs  Lab 09/14/17 1510  AST 20  ALT 15  ALKPHOS 62  BILITOT 0.4  PROT 7.3  ALBUMIN 3.9   Recent Labs  Lab 09/14/17 2152  LIPASE 23   No results for input(s): AMMONIA in the last 168 hours. CBC: Recent Labs  Lab 09/14/17 1510 09/15/17 0320 09/16/17 0423 09/17/17 0642 09/18/17 0719 09/19/17 0439  WBC 15.9* 12.7* 15.4* 11.5* 20.0* 20.5*  NEUTROABS 10.4*  --   --   --   --   --   HGB 12.3 12.3 10.2* 10.9* 13.5 12.5  HCT 37.6 39.7 33.3* 35.5* 42.9 39.4  MCV 87.4 89.6 90.5 89.6 88.8 88.9  PLT 207 242 211 201 240 259   Cardiac Enzymes: Recent Labs  Lab 09/14/17 1510 09/14/17 2152 09/15/17 0320 09/15/17 1220  TROPONINI 0.66* 1.76* 1.60* 0.93*   BNP: BNP (last 3 results) Recent Labs    09/14/17 1510  BNP 162.0*     ProBNP (last 3 results) No results for input(s): PROBNP in the last 8760 hours.  CBG: No results for input(s): GLUCAP in the last 168 hours.     Signed:  Edsel Petrin  [facility] 09/19/2017, 11:52 AM

## 2017-09-19 NOTE — Telephone Encounter (Signed)
Copied from CRM [phone]#[id number]. Topic: General - Other >> Sep 19, 2017 12:36 PM Gean BirchwoodWilliams-Neal, Sade R wrote: Reason for CRM: Pt called wanting to inform that she has been in the hospital for 5 days and she missed her appointment this morning. She stated she will be discharged today

## 2017-09-20 ENCOUNTER — Telehealth: Payer: Self-pay

## 2017-09-20 ENCOUNTER — Other Ambulatory Visit: Payer: Self-pay

## 2017-09-20 LAB — CULTURE, BLOOD (ROUTINE X 2)
CULTURE: NO GROWTH
Culture: NO GROWTH
SPECIAL REQUESTS: ADEQUATE
Special Requests: ADEQUATE

## 2017-09-20 NOTE — Telephone Encounter (Signed)
TCM call attempted. No answer unable to leave message for return call.

## 2017-09-21 ENCOUNTER — Other Ambulatory Visit: Payer: Self-pay | Admitting: Family

## 2017-09-21 NOTE — Telephone Encounter (Signed)
Called patient again regarding TCM . SHe has a Hospital Follow Up appointment just hadn't been able to speak with her about her hospitalization.

## 2017-09-22 ENCOUNTER — Other Ambulatory Visit: Payer: Self-pay

## 2017-09-22 ENCOUNTER — Telehealth: Payer: Self-pay

## 2017-09-22 DIAGNOSIS — N183 Chronic kidney disease, stage 3 unspecified: Secondary | ICD-10-CM

## 2017-09-22 NOTE — Telephone Encounter (Addendum)
Transition Care Management Follow-up Telephone Call  ADMISSION DATE: 09/14/17  DISCHARGE DATE: 09/19/17  Repeat CBC and CMP   How have you been since you were released from the hospital?  Feeling fine per patient.   Do you understand why you were in the hospital?  No per patient. Explained what was listed on discharge summary.    Do you understand the discharge instrcutions?  Yes    Items Reviewed:  Medications reviewed: Yes. Patient does not have Bentyl and Lexapro   Allergies reviewed:Yes   Dietary changes reviewed: Heart healthy   Referrals reviewed: Hospital follow up appointment scheduled with provide for 09/26/17   Functional Questionnaire:   Activities of Daily Living (ADLs): Patient can perform all independently.  Any patient concerns? Frequent headaches   Confirmed importance and date/time of follow-up visits scheduled: Yes   Confirmed with patient if condition begins to worsen call PCP or go to the ER. Yes    Patient was given the office number and encouragred to call back with questions or concerns. Yes

## 2017-09-23 LAB — BASIC METABOLIC PANEL
BUN/Creatinine Ratio: 19 (ref 12–28)
BUN: 24 mg/dL (ref 8–27)
CALCIUM: 8.9 mg/dL (ref 8.7–10.3)
CO2: 24 mmol/L (ref 20–29)
CREATININE: 1.24 mg/dL — AB (ref 0.57–1.00)
Chloride: 104 mmol/L (ref 96–106)
GFR, EST AFRICAN AMERICAN: 48 mL/min/{1.73_m2} — AB (ref >59)
GFR, EST NON AFRICAN AMERICAN: 42 mL/min/{1.73_m2} — AB (ref >59)
Glucose: 139 mg/dL — ABNORMAL HIGH (ref 65–99)
POTASSIUM: 3.8 mmol/L (ref 3.5–5.2)
Sodium: 143 mmol/L (ref 134–144)

## 2017-09-26 ENCOUNTER — Encounter: Payer: Self-pay | Admitting: Family

## 2017-09-26 ENCOUNTER — Ambulatory Visit (INDEPENDENT_AMBULATORY_CARE_PROVIDER_SITE_OTHER): Payer: Medicare HMO | Admitting: Family

## 2017-09-26 ENCOUNTER — Encounter: Payer: Self-pay | Admitting: *Deleted

## 2017-09-26 ENCOUNTER — Telehealth: Payer: Self-pay | Admitting: *Deleted

## 2017-09-26 VITALS — BP 127/95 | HR 71 | Temp 97.8°F | Resp 20 | Ht 63.0 in | Wt 202.0 lb

## 2017-09-26 DIAGNOSIS — N189 Chronic kidney disease, unspecified: Secondary | ICD-10-CM

## 2017-09-26 DIAGNOSIS — N289 Disorder of kidney and ureter, unspecified: Secondary | ICD-10-CM

## 2017-09-26 DIAGNOSIS — E785 Hyperlipidemia, unspecified: Secondary | ICD-10-CM | POA: Diagnosis not present

## 2017-09-26 DIAGNOSIS — I5030 Unspecified diastolic (congestive) heart failure: Secondary | ICD-10-CM | POA: Diagnosis not present

## 2017-09-26 DIAGNOSIS — J441 Chronic obstructive pulmonary disease with (acute) exacerbation: Secondary | ICD-10-CM | POA: Diagnosis not present

## 2017-09-26 DIAGNOSIS — J449 Chronic obstructive pulmonary disease, unspecified: Secondary | ICD-10-CM | POA: Diagnosis not present

## 2017-09-26 DIAGNOSIS — I214 Non-ST elevation (NSTEMI) myocardial infarction: Secondary | ICD-10-CM

## 2017-09-26 DIAGNOSIS — D72829 Elevated white blood cell count, unspecified: Secondary | ICD-10-CM

## 2017-09-26 DIAGNOSIS — I5033 Acute on chronic diastolic (congestive) heart failure: Secondary | ICD-10-CM | POA: Diagnosis not present

## 2017-09-26 LAB — CBC WITH DIFFERENTIAL/PLATELET
Basophils Absolute: 0.1 10*3/uL (ref 0.0–0.1)
Basophils Relative: 0.6 % (ref 0.0–3.0)
EOS ABS: 0.4 10*3/uL (ref 0.0–0.7)
Eosinophils Relative: 3.1 % (ref 0.0–5.0)
HCT: 34.8 % — ABNORMAL LOW (ref 36.0–46.0)
HEMOGLOBIN: 11.3 g/dL — AB (ref 12.0–15.0)
Lymphocytes Relative: 25.9 % (ref 12.0–46.0)
Lymphs Abs: 3.3 10*3/uL (ref 0.7–4.0)
MCHC: 32.4 g/dL (ref 30.0–36.0)
MCV: 88.3 fl (ref 78.0–100.0)
MONOS PCT: 7.2 % (ref 3.0–12.0)
Monocytes Absolute: 0.9 10*3/uL (ref 0.1–1.0)
NEUTROS ABS: 8 10*3/uL — AB (ref 1.4–7.7)
Neutrophils Relative %: 63.2 % (ref 43.0–77.0)
PLATELETS: 222 10*3/uL (ref 150.0–400.0)
RBC: 3.94 Mil/uL (ref 3.87–5.11)
RDW: 16.5 % — ABNORMAL HIGH (ref 11.5–15.5)
WBC: 12.7 10*3/uL — AB (ref 4.0–10.5)

## 2017-09-26 LAB — COMPREHENSIVE METABOLIC PANEL
ALBUMIN: 3.6 g/dL (ref 3.5–5.2)
ALK PHOS: 65 U/L (ref 39–117)
ALT: 12 U/L (ref 0–35)
AST: 12 U/L (ref 0–37)
BILIRUBIN TOTAL: 0.4 mg/dL (ref 0.2–1.2)
BUN: 15 mg/dL (ref 6–23)
CALCIUM: 8.5 mg/dL (ref 8.4–10.5)
CO2: 27 meq/L (ref 19–32)
CREATININE: 1.31 mg/dL — AB (ref 0.40–1.20)
Chloride: 104 mEq/L (ref 96–112)
GFR: 50.46 mL/min — AB (ref >60.00)
Glucose, Bld: 70 mg/dL (ref 70–99)
Potassium: 4.1 mEq/L (ref 3.5–5.1)
Sodium: 138 mEq/L (ref 135–145)
TOTAL PROTEIN: 6.1 g/dL (ref 6.0–8.3)

## 2017-09-26 MED ORDER — METOPROLOL SUCCINATE ER 25 MG PO TB24: 12.5000 mg | ORAL_TABLET | Freq: Every day | ORAL | 5 refills | Status: DC

## 2017-09-26 MED ORDER — MECLIZINE HCL 25 MG PO TABS: ORAL_TABLET | ORAL | 0 refills | Status: DC

## 2017-09-26 MED ORDER — ATORVASTATIN CALCIUM 40 MG PO TABS: 40.0000 mg | ORAL_TABLET | Freq: Every day | ORAL | 5 refills | Status: DC

## 2017-09-26 MED ORDER — LISINOPRIL 2.5 MG PO TABS: 2.5000 mg | ORAL_TABLET | Freq: Every day | ORAL | 5 refills | Status: DC

## 2017-09-26 MED ORDER — ESCITALOPRAM OXALATE 20 MG PO TABS: 20.0000 mg | ORAL_TABLET | Freq: Every day | ORAL | 5 refills | Status: DC

## 2017-09-26 MED ORDER — FUROSEMIDE 40 MG PO TABS: 40.0000 mg | ORAL_TABLET | Freq: Every day | ORAL | 3 refills | Status: DC

## 2017-09-26 MED ORDER — DICYCLOMINE HCL 10 MG PO CAPS: 10.0000 mg | ORAL_CAPSULE | Freq: Three times a day (TID) | ORAL | 1 refills | Status: DC | PRN

## 2017-09-26 NOTE — Patient Instructions (Addendum)
Please complete lab work prior to leaving. Keep your upcoming appointment with cardiology on 10/03/17.  You may use tylenol as needed for headache or back pain.  Start furosemide (fluid pill) once daily in the AM. ' Start back exercises twice daily. Let me know if your back pain worsens or if it does not improve.    Back Exercises The following exercises strengthen the muscles that help to support the back. They also help to keep the lower back flexible. Doing these exercises can help to prevent back pain or lessen existing pain. If you have back pain or discomfort, try doing these exercises 2-3 times each day or as told by your health care provider. When the pain goes away, do them once each day, but increase the number of times that you repeat the steps for each exercise (do more repetitions). If you do not have back pain or discomfort, do these exercises once each day or as told by your health care provider. Exercises Single Knee to Chest  Repeat these steps 3-5 times for each leg: 1. Lie on your back on a firm bed or the floor with your legs extended. 2. Bring one knee to your chest. Your other leg should stay extended and in contact with the floor. 3. Hold your knee in place by grabbing your knee or thigh. 4. Pull on your knee until you feel a gentle stretch in your lower back. 5. Hold the stretch for 10-30 seconds. 6. Slowly release and straighten your leg.  Pelvic Tilt  Repeat these steps 5-10 times: 1. Lie on your back on a firm bed or the floor with your legs extended. 2. Bend your knees so they are pointing toward the ceiling and your feet are flat on the floor. 3. Tighten your lower abdominal muscles to press your lower back against the floor. This motion will tilt your pelvis so your tailbone points up toward the ceiling instead of pointing to your feet or the floor. 4. With gentle tension and even breathing, hold this position for 5-10 seconds.  Cat-Cow  Repeat these steps  until your lower back becomes more flexible: 1. Get into a hands-and-knees position on a firm surface. Keep your hands under your shoulders, and keep your knees under your hips. You may place padding under your knees for comfort. 2. Let your head hang down, and point your tailbone toward the floor so your lower back becomes rounded like the back of a cat. 3. Hold this position for 5 seconds. 4. Slowly lift your head and point your tailbone up toward the ceiling so your back forms a sagging arch like the back of a cow. 5. Hold this position for 5 seconds.  Press-Ups  Repeat these steps 5-10 times: 1. Lie on your abdomen (face-down) on the floor. 2. Place your palms near your head, about shoulder-width apart. 3. While you keep your back as relaxed as possible and keep your hips on the floor, slowly straighten your arms to raise the top half of your body and lift your shoulders. Do not use your back muscles to raise your upper torso. You may adjust the placement of your hands to make yourself more comfortable. 4. Hold this position for 5 seconds while you keep your back relaxed. 5. Slowly return to lying flat on the floor.  Bridges  Repeat these steps 10 times: 1. Lie on your back on a firm surface. 2. Bend your knees so they are pointing toward the ceiling and your  feet are flat on the floor. 3. Tighten your buttocks muscles and lift your buttocks off of the floor until your waist is at almost the same height as your knees. You should feel the muscles working in your buttocks and the back of your thighs. If you do not feel these muscles, slide your feet 1-2 inches farther away from your buttocks. 4. Hold this position for 3-5 seconds. 5. Slowly lower your hips to the starting position, and allow your buttocks muscles to relax completely.  If this exercise is too easy, try doing it with your arms crossed over your chest. Abdominal Crunches  Repeat these steps 5-10 times: 1. Lie on your back  on a firm bed or the floor with your legs extended. 2. Bend your knees so they are pointing toward the ceiling and your feet are flat on the floor. 3. Cross your arms over your chest. 4. Tip your chin slightly toward your chest without bending your neck. 5. Tighten your abdominal muscles and slowly raise your trunk (torso) high enough to lift your shoulder blades a tiny bit off of the floor. Avoid raising your torso higher than that, because it can put too much stress on your low back and it does not help to strengthen your abdominal muscles. 6. Slowly return to your starting position.  Back Lifts Repeat these steps 5-10 times: 1. Lie on your abdomen (face-down) with your arms at your sides, and rest your forehead on the floor. 2. Tighten the muscles in your legs and your buttocks. 3. Slowly lift your chest off of the floor while you keep your hips pressed to the floor. Keep the back of your head in line with the curve in your back. Your eyes should be looking at the floor. 4. Hold this position for 3-5 seconds. 5. Slowly return to your starting position.  Contact a health care provider if:  Your back pain or discomfort gets much worse when you do an exercise.  Your back pain or discomfort does not lessen within 2 hours after you exercise. If you have any of these problems, stop doing these exercises right away. Do not do them again unless your health care provider says that you can. Get help right away if:  You develop sudden, severe back pain. If this happens, stop doing the exercises right away. Do not do them again unless your health care provider says that you can. This information is not intended to replace advice given to you by your health care provider. Make sure you discuss any questions you have with your health care provider. Document Released: 04/29/2004 Document Revised: 07/30/2015 Document Reviewed: 05/16/2014 Elsevier Interactive Patient Education  2017 [employer]

## 2017-09-26 NOTE — Telephone Encounter (Signed)
[facility] dicyclomine 10mg . ! Capsule by mouth three times daily as needed for muscle spasm.  Please advise?

## 2017-09-26 NOTE — Progress Notes (Signed)
Subjective:    Patient ID: Veronica Wolf, female    DOB: May 23, 1940, 77 y.o.   MRN: [id number]  HPI Patient is a 77 year old female who presents today for hospital follow-up.  Discharge summary is reviewed.  She was admitted on September 14, 2017 Due to chest pain.  She was diagnosed with NSTEMI.  Chest pain was located in the substernal area and was 8 out of 10 on admission.  She was found to have an elevated troponin level.  She was placed on IV heparin.  She was admitted for further evaluation.  Echocardiogram performed during this hospitalization noted left ventricular ejection fraction of 55 to 60% with grade 1 diastolic dysfunction.  Cardiac catheterization noted 20% RCA stenosis with a posterior atrial lesion 20% stenosis.  Cardiology recommended IV diuresis.  They started her on low-dose metoprolol and recommended that she continue Lasix 40 mg once daily as an outpatient.  They also recommended follow-up as an outpatient with cardiology.  She was noted to have moderate pulmonary hypertension on her cardiac cath.Her respiratory viral panel was positive for rhinovirus/enterovirus.  Strep pneumo urine antigen was negative.  She was noted to have a leukocytosis which was felt to be reactive to her steroids which she was given during her hospitalization for her COPD exacerbation.  She was treated empirically with a azithromycin during this admission.  She was noted to have acute on chronic renal insufficiency with a creatinine of 1.77 at time of discharge.  Baseline creatinine is 1.2-1.4.  Wt Readings from Last 3 Encounters:  09/26/17 202 lb (91.6 kg)  09/19/17 196 lb 6.4 oz (89.1 kg)  09/07/17 190 lb (86.2 kg)   Notes some headaches. Reports mild daily frontal HA's.  Also has some low back pain.  Radiates into the left hip.    She denies any chest pain since returning home. Reports that her breathing has returned to baseline.  Review of Systems See HPI  Past Medical History:  Diagnosis Date    . Allergic rhinitis   . Anxiety   . CKD (chronic kidney disease) 04/04/2017  . COPD (chronic obstructive pulmonary disease) (HCC)   . Depression   . GERD (gastroesophageal reflux disease)   . Hyperlipidemia   . Hypertension   . Peptic ulcer 01/04/2003  . Vertigo      Social History   Socioeconomic History  . Marital status: Widowed    Spouse name: Not on file  . Number of children: Not on file  . Years of education: Not on file  . Highest education level: Not on file  Occupational History  . Not on file  Social Needs  . Financial resource strain: Not on file  . Food insecurity:    Worry: Not on file    Inability: Not on file  . Transportation needs:    Medical: Not on file    Non-medical: Not on file  Tobacco Use  . Smoking status: Former Games developer  . Smokeless tobacco: Never Used  Substance and Sexual Activity  . Alcohol use: No    Comment: has previous hx of ETOH abuse, quit 2014  . Drug use: No  . Sexual activity: Not on file  Lifestyle  . Physical activity:    Days per week: Not on file    Minutes per session: Not on file  . Stress: Not on file  Relationships  . Social connections:    Talks on phone: Not on file    Gets together: Not on file  Attends religious service: Not on file    Active member of club or organization: Not on file    Attends meetings of clubs or organizations: Not on file    Relationship status: Not on file  . Intimate partner violence:    Fear of current or ex partner: Not on file    Emotionally abused: Not on file    Physically abused: Not on file    Forced sexual activity: Not on file  Other Topics Concern  . 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 ABDOMINAL HYSTERECTOMY    . LEFT HEART CATH AND CORONARY ANGIOGRAPHY N/A 06/13/2017   Procedure: LEFT HEART CATH AND CORONARY  ANGIOGRAPHY;  Surgeon: Marykay Lex, MD;  Location: [facility] INVASIVE CV LAB;  Service: Cardiovascular;  Laterality: N/A;  . RIGHT/LEFT HEART CATH AND CORONARY ANGIOGRAPHY N/A 09/16/2017   Procedure: RIGHT/LEFT HEART CATH AND CORONARY ANGIOGRAPHY;  Surgeon: Swaziland, Peter M, MD;  Location: [facility] INVASIVE CV LAB;  Service: Cardiovascular;  Laterality: N/A;  . ULTRASOUND GUIDANCE FOR VASCULAR ACCESS  09/16/2017   Procedure: Ultrasound Guidance For Vascular Access;  Surgeon: Swaziland, Peter M, MD;  Location: [facility] INVASIVE CV LAB;  Service: Cardiovascular;;    No family history on file.  Allergies  Allergen Reactions  . Ibuprofen Other (See Comments)    Pt has hx of peptic ulcer and diverticulitis.     Current Outpatient Medications on File Prior to Visit  Medication Sig Dispense Refill  . acetaminophen (TYLENOL) 500 MG tablet Take 500-1,000 mg by mouth 2 (two) times daily as needed for headache.    . albuterol (PROVENTIL) (2.5 MG/3ML) 0.083% nebulizer solution Take 3 mLs (2.5 mg total) by nebulization every 6 (six) hours as needed for wheezing or shortness of breath. 150 mL 1  . allopurinol (ZYLOPRIM) 100 MG tablet Take 2 tablets (200 mg total) by mouth daily. 60 tablet 6  . aspirin EC 81 MG tablet Take 1 tablet (81 mg total) by mouth daily. 90 tablet 3  . atorvastatin (LIPITOR) 40 MG tablet Take 1 tablet (40 mg total) by mouth daily at 6 PM. 30 tablet 0  . bismuth subsalicylate (PEPTO BISMOL) 262 MG/15ML suspension Take 30 mLs by mouth every 6 (six) hours as needed for indigestion or diarrhea or loose stools.    . diazepam (VALIUM) 10 MG tablet TAKE 1 TABLET BY MOUTH EVERY 12 HOURS AS NEEDED FOR ANXIETY 20 tablet 0  . dicyclomine (BENTYL) 10 MG capsule Take 10 mg by mouth 3 (three) times daily.    Marland Kitchen escitalopram (LEXAPRO) 20 MG tablet TAKE 1 TABLET BY MOUTH ONCE DAILY 30 tablet 0  . esomeprazole (NEXIUM) 20 MG capsule Take 20 mg by mouth daily as needed (GERD).    Marland Kitchen lisinopril (PRINIVIL,ZESTRIL) 2.5 MG  tablet Take 1 tablet (2.5 mg total) by mouth daily. 30 tablet 0  . meclizine (ANTIVERT) 25 MG tablet TAKE 1 TABLET BY MOUTH THREE TIMES DAILY AS NEEDED FOR  DIZZINESS 30 tablet 0  . metoprolol succinate (TOPROL-XL) 25 MG 24 hr tablet Take 0.5 tablets (12.5 mg total) by mouth daily. 30 tablet 0  . Multiple Vitamin (MULTIVITAMIN WITH MINERALS) TABS tablet Take 1 tablet by mouth daily.    Marland Kitchen nystatin (NYSTATIN) powder Apply 1 g topically 3 (three) times daily as needed (rash).    Marland Kitchen  pantoprazole (PROTONIX) 40 MG tablet Take 1 tablet (40 mg total) by mouth daily. 30 tablet 2  . predniSONE (DELTASONE) 10 MG tablet Take 3 tab x 2 days, then 2 tabs x 2 days, then 1 tab x 2 days. 12 tablet 0  . nitroGLYCERIN (NITROSTAT) 0.4 MG SL tablet Place 1 tablet (0.4 mg total) under the tongue every 5 (five) minutes as needed. (Patient taking differently: Place 0.4 mg under the tongue every 5 (five) minutes as needed for chest pain. ) 11 tablet 6   No current facility-administered medications on file prior to visit.     BP (!) 127/95 (BP Location: Right Arm, Cuff Size: Large)   Pulse 71   Temp 97.8 F (36.6 C) (Oral)   Resp 20   Ht 5\' 3"  (1.6 m)   Wt 202 lb (91.6 kg)   SpO2 98%   BMI 35.78 kg/m       Objective:   Physical Exam  Constitutional: She is oriented to person, place, and time. She appears well-developed and well-nourished.  HENT:  Head: Normocephalic and atraumatic.  Cardiovascular: Normal rate, regular rhythm and normal heart sounds.  No murmur heard. Pulmonary/Chest: Effort normal and breath sounds normal. No respiratory distress. She has no wheezes.  Neurological: She is alert and oriented to person, place, and time.  Psychiatric: She has a normal mood and affect. Her behavior is normal. Judgment and thought content normal.          Assessment & Plan:  NSTEMI- has follow up with cardiology on 10/03/17. Placed on aspirin and beta blocker. Denies current chest pain.   Hyperlipidemia-  started on lipitor 40mg  once daily during her hospitalization.  Lab Results  Component Value Date   CHOL 314 (H) 09/15/2017   HDL 78 09/15/2017   LDLCALC 217 (H) 09/15/2017   TRIG 96 09/15/2017   CHOLHDL 4.0 09/15/2017   Acute on chronic renal insufficiency- check follow up renal function.   COPD- had an acute exacerbation but is now back to baseline.   Leukocytosis- obtain follow up cbc.   Diastolic CHF- she was not sent out on lasix per cardiology recommendations. She has had weight gain since discharge. (12 pounds higher than she was on 6/5 prior to her admission) will Start furosemide. Will ask cardiology to check follow up bmet next week at her appointment.

## 2017-10-02 ENCOUNTER — Other Ambulatory Visit: Payer: Self-pay | Admitting: Family

## 2017-10-03 ENCOUNTER — Ambulatory Visit (INDEPENDENT_AMBULATORY_CARE_PROVIDER_SITE_OTHER): Payer: Medicare HMO | Admitting: Cardiology

## 2017-10-03 ENCOUNTER — Encounter: Payer: Self-pay | Admitting: Cardiology

## 2017-10-03 VITALS — BP 128/64 | HR 88 | Ht 63.0 in | Wt 194.8 lb

## 2017-10-03 DIAGNOSIS — J449 Chronic obstructive pulmonary disease, unspecified: Secondary | ICD-10-CM | POA: Diagnosis not present

## 2017-10-03 DIAGNOSIS — E782 Mixed hyperlipidemia: Secondary | ICD-10-CM | POA: Diagnosis not present

## 2017-10-03 DIAGNOSIS — I2510 Atherosclerotic heart disease of native coronary artery without angina pectoris: Secondary | ICD-10-CM

## 2017-10-03 DIAGNOSIS — N183 Chronic kidney disease, stage 3 unspecified: Secondary | ICD-10-CM

## 2017-10-03 NOTE — Progress Notes (Signed)
Cardiology Office Note:    Date:  10/03/2017   ID:  Veronica Wolf, DOB 1940-08-04, MRN [id number]  PCP:  [name], NP  Cardiologist:  Garwin Brothers, MD   Referring MD: [name], NP    ASSESSMENT:    1. Mild CAD   2. Mixed dyslipidemia   3. CKD (chronic kidney disease), stage III (HCC)    PLAN:    In order of problems listed above:  1. Secondary prevention stressed with the patient.  Importance of compliance with diet and medications stressed and she vocalized understanding.  Her blood pressure stable. 2. We will have a Chem-7 today in view of renal insufficiency. 3. Her echocardiogram revealed preserved systolic function and focal and regional wall motion abnormalities.  Her coronary angiography reports mild to moderate left ventricular systolic dysfunction.  I tried to reach our interventional colleague but it went straight to his voicemail.  At any rate patient will continue current medical therapy.  Coronary angiography report was discussed with her at length. 4. She will be seen in follow-up appointment in 3 months or earlier if she has any concerns.  At that time we will do a follow-up echocardiogram to understand the left ventricular systolic function and wall motion as a follow-up to medical therapy.   Medication Adjustments/Labs and Tests Ordered: Current medicines are reviewed at length with the patient today.  Concerns regarding medicines are outlined above.  No orders of the defined types were placed in this encounter.  No orders of the defined types were placed in this encounter.    Chief Complaint  Patient presents with  . Hospitalization Follow-up     History of Present Illness:    Veronica Wolf is a 77 y.o. female.  Patient was evaluated for coronary artery disease and coronary angiography revealed mild coronary artery disease.  She denies any problems at this time and takes care of activities of daily living.  No chest pain orthopnea  or PND.  She is now exercising on a regular basis.  With this she has no symptoms.  At the time of my evaluation, the patient is alert awake oriented and in no distress.  Past Medical History:  Diagnosis Date  . Allergic rhinitis   . Anxiety   . CKD (chronic kidney disease) 04/04/2017  . COPD (chronic obstructive pulmonary disease) (HCC)   . Depression   . GERD (gastroesophageal reflux disease)   . Hyperlipidemia   . Hypertension   . Peptic ulcer 01/04/2003  . Vertigo     Past Surgical History:  Procedure Laterality Date  . ABDOMINAL HYSTERECTOMY    . LEFT HEART CATH AND CORONARY ANGIOGRAPHY N/A 06/13/2017   Procedure: LEFT HEART CATH AND CORONARY ANGIOGRAPHY;  Surgeon: Marykay Lex, MD;  Location[facility] INVASIVE CV LAB;  Service: Cardiovascular;  Laterality: N/A;  . RIGHT/LEFT HEART CATH AND CORONARY ANGIOGRAPHY N/A 09/16/2017   Procedure: RIGHT/LEFT HEART CATH AND CORONARY ANGIOGRAPHY;  Surgeon: Swaziland, Peter M, MD;  Location: [facility] INVASIVE CV LAB;  Service: Cardiovascular;  Laterality: N/A;  . ULTRASOUND GUIDANCE FOR VASCULAR ACCESS  09/16/2017   Procedure: Ultrasound Guidance For Vascular Access;  Surgeon: Swaziland, Peter M, MD;  Location: [facility] INVASIVE CV LAB;  Service: Cardiovascular;;    Current Medications: Current Meds  Medication Sig  . acetaminophen (TYLENOL) 500 MG tablet Take 500-1,000 mg by mouth 2 (two) times daily as needed for headache.  . albuterol (PROVENTIL) (2.5 MG/3ML) 0.083% nebulizer solution Take 3 mLs (2.5 mg  total) by nebulization every 6 (six) hours as needed for wheezing or shortness of breath.  . allopurinol (ZYLOPRIM) 100 MG tablet Take 2 tablets (200 mg total) by mouth daily.  Marland Kitchen. aspirin EC 81 MG tablet Take 1 tablet (81 mg total) by mouth daily.  Marland Kitchen. atorvastatin (LIPITOR) 40 MG tablet Take 1 tablet (40 mg total) by mouth daily at 6 PM.  . bismuth subsalicylate (PEPTO BISMOL) 262 MG/15ML suspension Take 30 mLs by mouth every 6 (six) hours as needed for  indigestion or diarrhea or loose stools.  . diazepam (VALIUM) 10 MG tablet TAKE 1 TABLET BY MOUTH EVERY 12 HOURS AS NEEDED FOR ANXIETY  . dicyclomine (BENTYL) 10 MG capsule Take 1 capsule (10 mg total) by mouth 3 (three) times daily as needed for spasms.  Marland Kitchen. escitalopram (LEXAPRO) 20 MG tablet Take 1 tablet (20 mg total) by mouth daily.  Marland Kitchen. esomeprazole (NEXIUM) 20 MG capsule Take 20 mg by mouth daily as needed (GERD).  . furosemide (LASIX) 40 MG tablet Take 1 tablet (40 mg total) by mouth daily.  Marland Kitchen. HYDROcodone-acetaminophen (NORCO/VICODIN) 5-325 MG tablet Take 1 tablet by mouth every 6 (six) hours as needed for moderate pain or severe pain.  Marland Kitchen. lisinopril (PRINIVIL,ZESTRIL) 2.5 MG tablet Take 1 tablet (2.5 mg total) by mouth daily.  . meclizine (ANTIVERT) 25 MG tablet TAKE 1 TABLET BY MOUTH THREE TIMES DAILY AS NEEDED FOR  DIZZINESS  . metoprolol succinate (TOPROL-XL) 25 MG 24 hr tablet Take 0.5 tablets (12.5 mg total) by mouth daily.  . Multiple Vitamin (MULTIVITAMIN WITH MINERALS) TABS tablet Take 1 tablet by mouth daily.  . nitroGLYCERIN (NITROSTAT) 0.4 MG SL tablet Place 1 tablet (0.4 mg total) under the tongue every 5 (five) minutes as needed. (Patient taking differently: Place 0.4 mg under the tongue every 5 (five) minutes as needed for chest pain. )  . pantoprazole (PROTONIX) 40 MG tablet Take 1 tablet (40 mg total) by mouth daily.  . predniSONE (DELTASONE) 20 MG tablet Take 40 mg by mouth daily with breakfast.     Allergies:   Ibuprofen   Social History   Socioeconomic History  . Marital status: Widowed    Spouse name: Not on file  . Number of children: Not on file  . Years of education: Not on file  . Highest education level: Not on file  Occupational History  . Not on file  Social Needs  . Financial resource strain: Not on file  . Food insecurity:    Worry: Not on file    Inability: Not on file  . Transportation needs:    Medical: Not on file    Non-medical: Not on file    Tobacco Use  . Smoking status: Former Games developermoker  . Smokeless tobacco: Never Used  Substance and Sexual Activity  . Alcohol use: No    Comment: has previous hx of ETOH abuse, quit 2014  . Drug use: No  . Sexual activity: Not on file  Lifestyle  . Physical activity:    Days per week: Not on file    Minutes per session: Not on file  . Stress: Not on file  Relationships  . Social connections:    Talks on phone: Not on file    Gets together: Not on file    Attends religious service: Not on file    Active member of club or organization: Not on file    Attends meetings of clubs or organizations: Not on file    Relationship  status: Not on file  Other Topics Concern  . 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  ROS:   Please see the history of present illness.    All other systems reviewed and are negative.  EKGs/Labs/Other Studies Reviewed:    The following studies were reviewed today: I discussed coronary angiography findings with the patient at length.   Recent Labs: 09/14/2017: B Natriuretic Peptide 162.0 09/26/2017: ALT 12; BUN 15; Creatinine, Ser 1.31; Hemoglobin 11.3; Platelets 222.0; Potassium 4.1; Sodium 138  Recent Lipid Panel    Component Value Date/Time   CHOL 314 (H) 09/15/2017 0320   TRIG 96 09/15/2017 0320   HDL 78 09/15/2017 0320   CHOLHDL 4.0 09/15/2017 0320   VLDL 19 09/15/2017 0320   LDLCALC 217 (H) 09/15/2017 0320    Physical Exam:    VS:  BP 128/64   Pulse 88   Ht 5\' 3"  (1.6 m)   Wt 194 lb 12.8 oz (88.4 kg)   SpO2 95%   BMI 34.51 kg/m     Wt Readings from Last 3 Encounters:  10/03/17 194 lb 12.8 oz (88.4 kg)  09/26/17 202 lb (91.6 kg)  09/19/17 196 lb 6.4 oz (89.1 kg)     GEN: Patient is in no acute distress HEENT: Normal NECK: No JVD; No  carotid bruits LYMPHATICS: No lymphadenopathy CARDIAC: Hear sounds regular, 2/6 systolic murmur at the apex. RESPIRATORY:  Clear to auscultation without rales, wheezing or rhonchi  ABDOMEN: Soft, non-tender, non-distended MUSCULOSKELETAL:  No edema; No deformity  SKIN: Warm and dry NEUROLOGIC:  Alert and oriented x 3 PSYCHIATRIC:  Normal affect   Signed, Garwin Brothers, MD  10/03/2017 10:56 AM    [facility]

## 2017-10-03 NOTE — Patient Instructions (Signed)
Medication Instructions:  Your physician recommends that you continue on your current medications as directed. Please refer to the Current Medication list given to you today.   Labwork: Your physician recommends that you return for lab work today: BMP.   Testing/Procedures: None  Follow-Up: Your physician wants you to follow-up in: 3 months. You will receive a reminder letter in the mail two months in advance. If you don't receive a letter, please call our office to schedule the follow-up appointment.   If you need a refill on your cardiac medications before your next appointment, please call your pharmacy.   Thank you for choosing [facility]! Catherine Lockhart, RN [phone]    

## 2017-10-04 LAB — BASIC METABOLIC PANEL
BUN/Creatinine Ratio: 11 — ABNORMAL LOW (ref 12–28)
BUN: 19 mg/dL (ref 8–27)
CHLORIDE: 98 mmol/L (ref 96–106)
CO2: 26 mmol/L (ref 20–29)
CREATININE: 1.79 mg/dL — AB (ref 0.57–1.00)
Calcium: 9.2 mg/dL (ref 8.7–10.3)
GFR calc non Af Amer: 27 mL/min/{1.73_m2} — ABNORMAL LOW (ref >59)
GFR, EST AFRICAN AMERICAN: 31 mL/min/{1.73_m2} — AB (ref >59)
Glucose: 127 mg/dL — ABNORMAL HIGH (ref 65–99)
Potassium: 3.9 mmol/L (ref 3.5–5.2)
Sodium: 139 mmol/L (ref 134–144)

## 2017-10-04 NOTE — Telephone Encounter (Signed)
Requesting: Valium 10mg  q 12hr prn  Contract: yes UDS:03/22/17 Last OV: 09/26/17 Next Ov: N/A Last refill: 09/10/17, #20, 0RF Database: no discrepancies found  Please advise.

## 2017-10-07 ENCOUNTER — Telehealth: Payer: Self-pay | Admitting: *Deleted

## 2017-10-07 ENCOUNTER — Encounter: Payer: Self-pay | Admitting: *Deleted

## 2017-10-07 DIAGNOSIS — N183 Chronic kidney disease, stage 3 unspecified: Secondary | ICD-10-CM

## 2017-10-07 NOTE — Telephone Encounter (Signed)
Patient informed of lab results and advised to come back to our office in 1 month for follow up lab work. Patient verbalized understanding. No further questions.

## 2017-10-11 ENCOUNTER — Telehealth: Payer: Self-pay | Admitting: *Deleted

## 2017-10-11 NOTE — Telephone Encounter (Signed)
I recommend OV with us or her cardiologist.

## 2017-10-11 NOTE — Telephone Encounter (Signed)
Copied from CRM [phone]. Topic: General - Other >> Oct 11, 2017  2:31 PM Gaynelle AduPoole, Shalonda wrote: Reason for CRM: patient is calling and requesting something to be called in for her pain due to surgery on 09-14-17 in her Groin area. Please advise

## 2017-10-11 NOTE — Telephone Encounter (Signed)
Notified pt. She is appreciative of the recommendation and will call cardiologist for appointment.

## 2017-10-11 NOTE — Telephone Encounter (Signed)
Spoke with pt. She states she started having pain in groin 2 days age where the cardiac cath was inserted. Reports that she does not have any pain while standing / walking but pain starts and is constant when she lies down. Pt denies discoloration or swelling at that site and denies any other symptoms. Pt declines office visit and wants to know if there is something she can take OTC or prescription that will help her pain?

## 2017-10-12 ENCOUNTER — Encounter: Payer: Self-pay | Admitting: *Deleted

## 2017-10-12 ENCOUNTER — Other Ambulatory Visit: Payer: Self-pay | Admitting: *Deleted

## 2017-10-12 NOTE — Patient Outreach (Signed)
[facility](THN) Care Management  [facility] Care [facility]  10/13/2017   Veronica ChinaLois P Wolf January 27, 1941 [id number]   RN Health Coach Initial Assessment  Referral Date:  09/20/2017 Referral Source:  Hospital Liaison Reason for Referral:  Disease Management Education Insurance:  [employer] Medicare   Outreach Attempt:  Successful telephone outreach to patient for initial telephone assessment.  HIPAA verified with patient.  RN Health Coach introduced self and role. Patient verbally agrees to monthly telephone outreaches.  Patient completed initial telephone assessment.  Social:  Patient reports living at home alone.  Ambulates independently and denies any falls in the last year.  Reports being independent with ADLs and IADLs.  Drives herself to her medical appointments.  Patient list her daughter, Veronica Wolf as her caregiver, but later in conversation states her and her daughters relationship has become estranged.  States she has not spoke with daughter since last hospitalization.  Verbalizes she is down some days related to this, but declines [facility] at this time for depression.  States she has tried to stay positive and has the assistance of her friends; and hopes her relationship with her daughter will get better soon.  DME in the home include:  Glasses and nebulizer.  Conditions:   Per chart review and discussion with patient, PMH includes but not limited to:  Anxiety, chronic kidney disease, COPD, depression, GERD, hyperlipidemia, hypertension, peptic ulcer disease, and vertigo.  Patient recently diagnosed with NSTEMI and pulmonary hypertension.  States she was just recently diagnosed with COPD as well and is not very familiar with this diagnosis.  Denies any shortness of breath or chest pain.  States she has used her rescue inhaler about twice.  Reports she was told to weigh herself daily but does not have a scale at home.  Encouraged patient to purchase scale as soon as possible.   Patient is reporting intermittent pain in right groin, where she had heart catheterization, when lying down.  She was instructed by her primary care provider to arrange appointment with primary care or cardiologist.  Patient stating she really does not want to go in to be seen.  Discussed with patient if groin was bruised or had hematoma; patient denies bruising or hematoma.  Encouraged patient to at least call Cardiologist to discuss groin pain and to schedule appointment per provider recommendations.  Discussed with patient possible catheterization complications.  Patient stated she would call and discuss groin pain with cardiology.  Medications:  Patient reports taking about 6 medications and manages her medications herself with a weekly pill box fill.  Denies any difficulties managing medications and denies any difficulties affording medications.  Patient does state she has not refilled her prednisone prescription and is not sure why she was taking the prednisone.  Encouraged patient to contact her primary care provider to discuss prednisone and whether she needs to continue this medication.  Patient also stating she was told be a provider not to take her statin any longer.  Encouraged patient to discuss statin with her cardiologist.  Encounter Medications:  Outpatient Encounter Medications as of 10/12/2017  Medication Sig Note  . acetaminophen (TYLENOL) 500 MG tablet Take 500-1,000 mg by mouth 2 (two) times daily as needed for headache.   . albuterol (PROVENTIL) (2.5 MG/3ML) 0.083% nebulizer solution Take 3 mLs (2.5 mg total) by nebulization every 6 (six) hours as needed for wheezing or shortness of breath.   . allopurinol (ZYLOPRIM) 100 MG tablet Take 2 tablets (200 mg total) by mouth  daily.   . aspirin EC 81 MG tablet Take 1 tablet (81 mg total) by mouth daily.   Marland Kitchen bismuth subsalicylate (PEPTO BISMOL) 262 MG/15ML suspension Take 30 mLs by mouth every 6 (six) hours as needed for indigestion or  diarrhea or loose stools.   . diazepam (VALIUM) 10 MG tablet TAKE 1 TABLET BY MOUTH EVERY 12 HOURS AS NEEDED FOR ANXIETY   . dicyclomine (BENTYL) 10 MG capsule Take 1 capsule (10 mg total) by mouth 3 (three) times daily as needed for spasms.   Marland Kitchen escitalopram (LEXAPRO) 20 MG tablet Take 1 tablet (20 mg total) by mouth daily.   Marland Kitchen esomeprazole (NEXIUM) 20 MG capsule Take 20 mg by mouth daily as needed (GERD).   . furosemide (LASIX) 40 MG tablet Take 1 tablet (40 mg total) by mouth daily.   Marland Kitchen lisinopril (PRINIVIL,ZESTRIL) 2.5 MG tablet Take 1 tablet (2.5 mg total) by mouth daily.   . meclizine (ANTIVERT) 25 MG tablet TAKE 1 TABLET BY MOUTH THREE TIMES DAILY AS NEEDED FOR  DIZZINESS   . metoprolol succinate (TOPROL-XL) 25 MG 24 hr tablet Take 0.5 tablets (12.5 mg total) by mouth daily.   . Multiple Vitamin (MULTIVITAMIN WITH MINERALS) TABS tablet Take 1 tablet by mouth daily.   . nitroGLYCERIN (NITROSTAT) 0.4 MG SL tablet Place 1 tablet (0.4 mg total) under the tongue every 5 (five) minutes as needed. (Patient taking differently: Place 0.4 mg under the tongue every 5 (five) minutes as needed for chest pain. )   . pantoprazole (PROTONIX) 40 MG tablet Take 1 tablet (40 mg total) by mouth daily.   Marland Kitchen atorvastatin (LIPITOR) 40 MG tablet Take 1 tablet (40 mg total) by mouth daily at 6 PM. (Patient not taking: Reported on 10/12/2017) 10/12/2017: Reports physician told her to stop taking  . HYDROcodone-acetaminophen (NORCO/VICODIN) 5-325 MG tablet Take 1 tablet by mouth every 6 (six) hours as needed for moderate pain or severe pain. 10/12/2017: Reports not currently taking  . predniSONE (DELTASONE) 20 MG tablet Take 40 mg by mouth daily with breakfast. 10/12/2017: Does not have prescription for   No facility-administered encounter medications on file as of 10/12/2017.     Functional Status:  In your present state of health, do you have any difficulty performing the following activities: 10/12/2017 09/15/2017   Hearing? N N  Vision? N N  Difficulty concentrating or making decisions? N N  Walking or climbing stairs? N N  Dressing or bathing? N N  Doing errands, shopping? N N  Preparing Food and eating ? N -  Using the Toilet? N -  In the past six months, have you accidently leaked urine? N -  Do you have problems with loss of bowel control? N -  Managing your Medications? N -  Managing your Finances? N -  Housekeeping or managing your Housekeeping? N -  Some recent data might be hidden    Fall/Depression Screening: Fall Risk  10/12/2017 09/26/2017 03/22/2017  Falls in the past year? No No No   PHQ 2/9 Scores 10/12/2017 09/26/2017  PHQ - 2 Score 0 1    THN CM Care Plan Problem One     Most Recent Value  Care Plan Problem One  Knowledge deficiet related to diagnosis of COPD  Role Documenting the Problem One  Health Coach  Care Plan for Problem One  Active  THN Long Term Goal   Patient will not have any hospitalizations in the next 90 days  THN Long Term Goal  Start Date  10/12/17  Interventions for Problem One Long Term Goal  Current care plan and goals reviewed and discussed with patient, reviewed what COPD means with patient and discussed signs and symptoms COPD, encouraged patient to make follow up appointments with primary care and cardiologist, reviewed medications and encouraged medication compliance  THN CM Short Term Goal #1   Patient will purchase scale within the next 30 days.  THN CM Short Term Goal #1 Start Date  10/12/17  Interventions for Short Term Goal #1  Discussed with patient importance of daily weights and physicians instructions of weighing daily, discussed reason for daily weights and importance of medication compliance with her lasix, confirmed patient can afford scale for purchase for home, encouraged patient to purchase scale and begin daily weighing as soon as possible, sending patient 2019 Calendar Booklet to assist with recording daily weights  THN CM Short Term Goal  #2   Patient will discuss right groin pain and medication refills with physician within the next 30 days.  THN CM Short Term Goal #2 Start Date  10/12/17  Interventions for Short Term Goal #2  Confirmed with patient right groin without bruising or hematoma, encouraged patient to call Cardiologist to discuss groin pain and make appointment to be seen for groin pain per primary care providers recommendations, reviewed medications with patient, encouraged patient to review medications with providers and discuss indications, discussed prednisone with patient and instructed her to verifiy with primary care provider if she need to continue this medication and if so to request refill as soon as possible,  encouraged patient to discuss taking her statin medication with the cardiologist as soon as possible  THN CM Short Term Goal #3  Patient will review COPD Education within the next 30 days.  THN CM Short Term Goal #3 Start Date  10/12/17  Interventions for Short Tern Goal #3  Sending patient COPD educational packet, encourage patient to review COPD packet, discussed COPD diagnosis and what COPD stands for, sending EMMI Chronic Obstructive Pumonary Disease (COPD), including Emphysema      Appointments:  Patient attended post hospital follow up with primary care provider, [name] NP on 09/26/2017 and Cardiologist, Dr. Tomie Wolf on July 1st.  Verbalizes she needs to schedule follow up appointments with both provider.  Patient encouraged to schedule follow up appointments.  Advanced Directives:  Reports having Living Will and Health Care Power of Attorney in place.  Does not wish to make changes at this time.   Consent:  [facility] services reviewed and discussed with patient.  Patient verbally agrees to monthly telephone outreaches.  Plan: RN Health Coach will send primary MD barriers letter. RN Health Coach will route initial telephone assessment note to primary MD. RN Health Coach will send patient [facility]  Welcome Packet. RN Health Coach will send patient 2019 Calendar Booklet. RN Health Coach will send patient COPD Education Packet. RN Health Coach will send patient EMMI Chronic Obstructive Pulmonary Disease (COPD), including Emphysema.  Rhae Lerner RN [facility] Care Management  RN Health Coach [phone] [email or web address]

## 2017-10-14 ENCOUNTER — Telehealth: Payer: Self-pay

## 2017-10-14 NOTE — Telephone Encounter (Signed)
Patient called due to having pain in the groin and wanting to know what she could take. I did ask if this is where her surgical site was for her heart cath, and she did state it was. She advised that the pain was more of a discomfort that she has off and on and is relieved with Tylenol. She states that she was wondering if she could put on a "cream" and I let her know I did not think that a cream was available that she would be able to put on the site. I advised that since she has been having relief with the Tylenol to continue taking it as directed.

## 2017-10-20 ENCOUNTER — Other Ambulatory Visit: Payer: Self-pay | Admitting: Family

## 2017-10-20 DIAGNOSIS — Z79899 Other long term (current) drug therapy: Secondary | ICD-10-CM

## 2017-10-21 NOTE — Telephone Encounter (Signed)
Valium rx sent (reviewed [place] controlled substance registry and fills appropriate).   Needs to return for UDS please .

## 2017-10-28 NOTE — Telephone Encounter (Signed)
Author phoned pt. To set up a time for her to come to provide a UDS sample per contract. Lab appointment made for 7/29 at 1030, lab order placed.

## 2017-10-31 ENCOUNTER — Other Ambulatory Visit (INDEPENDENT_AMBULATORY_CARE_PROVIDER_SITE_OTHER): Payer: Medicare HMO

## 2017-10-31 DIAGNOSIS — Z79899 Other long term (current) drug therapy: Secondary | ICD-10-CM

## 2017-11-02 LAB — PAIN MGMT, PROFILE 8 W/CONF, U
6 ACETYLMORPHINE: NEGATIVE ng/mL (ref <10)
ALCOHOL METABOLITES: NEGATIVE ng/mL (ref <500)
AMINOCLONAZEPAM: NEGATIVE ng/mL (ref <25)
Alphahydroxyalprazolam: NEGATIVE ng/mL (ref <25)
Alphahydroxymidazolam: NEGATIVE ng/mL (ref <50)
Alphahydroxytriazolam: NEGATIVE ng/mL (ref <50)
Amphetamines: NEGATIVE ng/mL (ref <500)
Benzodiazepines: POSITIVE ng/mL — AB (ref <100)
Buprenorphine, Urine: NEGATIVE ng/mL (ref <5)
COCAINE METABOLITE: NEGATIVE ng/mL (ref <150)
CREATININE: 261.5 mg/dL
Hydroxyethylflurazepam: NEGATIVE ng/mL (ref <50)
LORAZEPAM: NEGATIVE ng/mL (ref <50)
MDMA: NEGATIVE ng/mL (ref <500)
Marijuana Metabolite: NEGATIVE ng/mL (ref <20)
NORDIAZEPAM: 1799 ng/mL — AB (ref <50)
OPIATES: NEGATIVE ng/mL (ref <100)
OXIDANT: NEGATIVE ug/mL (ref <200)
Oxazepam: 6446 ng/mL — ABNORMAL HIGH (ref <50)
Oxycodone: NEGATIVE ng/mL (ref <100)
PH: 5.22 (ref 4.5–9.0)
TEMAZEPAM: 8432 ng/mL — AB (ref <50)

## 2017-11-04 ENCOUNTER — Ambulatory Visit: Payer: Self-pay | Admitting: *Deleted

## 2017-11-12 ENCOUNTER — Other Ambulatory Visit: Payer: Self-pay | Admitting: Family

## 2017-11-14 NOTE — Telephone Encounter (Signed)
Received refill request for diazepam (VALIUM) 10 MG tablet.   Last OV: 09/26/17 Last RF: 10/21/17   03/22/17:CSC completed and signed 10/31/17 UDS sample given,  Moderate Risk  Please advise

## 2017-11-15 ENCOUNTER — Other Ambulatory Visit: Payer: Self-pay | Admitting: Family

## 2017-11-15 ENCOUNTER — Other Ambulatory Visit: Payer: Self-pay | Admitting: *Deleted

## 2017-11-15 ENCOUNTER — Encounter: Payer: Self-pay | Admitting: *Deleted

## 2017-11-15 ENCOUNTER — Other Ambulatory Visit: Payer: Self-pay

## 2017-11-15 NOTE — Patient Outreach (Signed)
[facility](THN) Care Management  11/15/2017  Juleen ChinaLois P Mcilvain [birth year] [id number]   RN Health Coach Monthly Outreach  Referral Date:  09/20/2017 Referral Source:  Hospital Liaison Reason for Referral:  Disease Management Education Insurance:  [employer] Medicare   Outreach Attempt:  Successful telephone outreach to patient for monthly follow up.  HIPAA verified with patient.  Patient reporting she is not feeling well.  States she has nauseated in the mornings, sleeping a lot, feeling nervous, and chest pains in the mornings relieved after taking her medications and eating breakfast.  Reports her relationship with her daughter is some better, she is talking to her, but is still down because she feels her family does not come to see her or call to check on her regularly.  Patient stating she does feel depressed and it is making her sleep a lot and not be concerned about other things; for instance she has not called to refill her medications that she is out of.  Encouraged patient to call the pharmacy to refill all medications she is out of today.  [facility] referral discussed and patient verbally agrees.  Patient also stating she usually does not have issues with refilling medications and states she does not need any assistance with medication compliance at this time.  Denies any shortness of breath at this time and reports using her rescue inhaler about once or twice a day.  Patient reporting right groin pain is pretty much resolved.  Appointments:  Has scheduled appointment with primary care provider, Sandford CrazeMelissa O'Sullivan NP on 11/28/2017.  Plan: RN Health Coach will send [facility] SW referral for possible assistance with community resources related to Depression. RN Health Coach will make next monthly outreach to patient in the month of September.  Rhae LernerFarrah Raeann Offner RN [facility] Care Management  RN Health Coach [phone] [email or web address]

## 2017-11-15 NOTE — Telephone Encounter (Signed)
Received fax from pharmacy requesting refill on patient's valium. Refill sent yesterday electronically. Fax shredded.

## 2017-11-16 NOTE — Telephone Encounter (Signed)
Can you please ask her how often she is needing it? I only want her to take PRN.

## 2017-11-16 NOTE — Telephone Encounter (Signed)
Received request from pharmacy for meclizine and wants to dispense 90 day supply?

## 2017-11-18 NOTE — Telephone Encounter (Signed)
Patient was confused about instructions, she got her bottle and said "she was out, and she only takes one when she feels dizzy which is not all the time, only a few times a mont". I explained to her she needs to use one three times a day when dizzy.

## 2017-11-19 ENCOUNTER — Other Ambulatory Visit: Payer: Self-pay | Admitting: Family

## 2017-11-21 MED ORDER — PANTOPRAZOLE SODIUM 40 MG PO TBEC: 40.0000 mg | DELAYED_RELEASE_TABLET | Freq: Every day | ORAL | 5 refills | Status: DC

## 2017-11-21 NOTE — Addendum Note (Signed)
Addended by: Kaesyn Johnston A on: 11/21/2017 01:25 PM   Modules accepted: Orders  

## 2017-11-22 ENCOUNTER — Telehealth: Payer: Self-pay | Admitting: Family

## 2017-11-22 NOTE — Telephone Encounter (Signed)
Copied from CRM [id number]. Topic: General - Other >> Nov 22, 2017  3:07 PM Darletta MollLander, Lumin L wrote: Reason for CRM: Patient wants to know if she is supposed to be taking atorvastatin (LIPITOR) 40 MG tablet? She has it available at home but thinks she was told to discontinue. Please advise.

## 2017-11-23 NOTE — Telephone Encounter (Signed)
Notified pt that last office note with PCP and cardiology suggests she should still be taking medication.

## 2017-11-28 ENCOUNTER — Emergency Department (HOSPITAL_BASED_OUTPATIENT_CLINIC_OR_DEPARTMENT_OTHER)
Admission: EM | Admit: 2017-11-28 | Discharge: 2017-11-28 | Disposition: A | Discharge status: Home / Self Care | Payer: Medicare HMO | Attending: Emergency Medicine | Admitting: Emergency Medicine

## 2017-11-28 ENCOUNTER — Emergency Department (HOSPITAL_BASED_OUTPATIENT_CLINIC_OR_DEPARTMENT_OTHER): Payer: Medicare HMO

## 2017-11-28 ENCOUNTER — Ambulatory Visit (INDEPENDENT_AMBULATORY_CARE_PROVIDER_SITE_OTHER): Payer: Medicare HMO | Admitting: Family

## 2017-11-28 ENCOUNTER — Other Ambulatory Visit: Payer: Self-pay

## 2017-11-28 ENCOUNTER — Encounter (HOSPITAL_BASED_OUTPATIENT_CLINIC_OR_DEPARTMENT_OTHER): Payer: Self-pay | Admitting: *Deleted

## 2017-11-28 ENCOUNTER — Encounter: Payer: Self-pay | Admitting: Family

## 2017-11-28 VITALS — BP 82/52 | HR 65 | Temp 97.7°F | Resp 16 | Ht 63.0 in | Wt 200.6 lb

## 2017-11-28 DIAGNOSIS — N189 Chronic kidney disease, unspecified: Secondary | ICD-10-CM | POA: Diagnosis not present

## 2017-11-28 DIAGNOSIS — J449 Chronic obstructive pulmonary disease, unspecified: Secondary | ICD-10-CM | POA: Diagnosis not present

## 2017-11-28 DIAGNOSIS — R079 Chest pain, unspecified: Secondary | ICD-10-CM

## 2017-11-28 DIAGNOSIS — I129 Hypertensive chronic kidney disease with stage 1 through stage 4 chronic kidney disease, or unspecified chronic kidney disease: Secondary | ICD-10-CM | POA: Diagnosis not present

## 2017-11-28 DIAGNOSIS — R0789 Other chest pain: Secondary | ICD-10-CM | POA: Insufficient documentation

## 2017-11-28 LAB — CBC
HEMATOCRIT: 38.8 % (ref 36.0–46.0)
HEMOGLOBIN: 12.3 g/dL (ref 12.0–15.0)
MCH: 29.1 pg (ref 26.0–34.0)
MCHC: 31.7 g/dL (ref 30.0–36.0)
MCV: 91.9 fL (ref 78.0–100.0)
Platelets: 185 10*3/uL (ref 150–400)
RBC: 4.22 MIL/uL (ref 3.87–5.11)
RDW: 15.8 % — ABNORMAL HIGH (ref 11.5–15.5)
WBC: 8.7 10*3/uL (ref 4.0–10.5)

## 2017-11-28 LAB — BASIC METABOLIC PANEL
Anion gap: 7 (ref 5–15)
BUN: 18 mg/dL (ref 8–23)
CHLORIDE: 105 mmol/L (ref 98–111)
CO2: 31 mmol/L (ref 22–32)
CREATININE: 1.68 mg/dL — AB (ref 0.44–1.00)
Calcium: 8.7 mg/dL — ABNORMAL LOW (ref 8.9–10.3)
GFR calc Af Amer: 33 mL/min — ABNORMAL LOW (ref >60)
GFR calc non Af Amer: 28 mL/min — ABNORMAL LOW (ref >60)
Glucose, Bld: 89 mg/dL (ref 70–99)
Potassium: 4 mmol/L (ref 3.5–5.1)
SODIUM: 143 mmol/L (ref 135–145)

## 2017-11-28 LAB — TROPONIN I: Troponin I: <0.03 ng/mL (ref <0.03)

## 2017-11-28 MED ORDER — ASPIRIN 81 MG PO CHEW
324.0000 mg | CHEWABLE_TABLET | Freq: Once | ORAL | Status: AC
  Administered 2017-11-28: 324 mg via ORAL
  Filled 2017-11-28: qty 4

## 2017-11-28 MED ORDER — ALUM & MAG HYDROXIDE-SIMETH 200-200-20 MG/5ML PO SUSP
30.0000 mL | Freq: Once | ORAL | Status: AC
  Administered 2017-11-28: 30 mL via ORAL
  Filled 2017-11-28: qty 30

## 2017-11-28 MED ORDER — SUCRALFATE 1 G PO TABS: 1.0000 g | ORAL_TABLET | Freq: Four times a day (QID) | ORAL | 0 refills | Status: DC | PRN

## 2017-11-28 MED ORDER — LIDOCAINE VISCOUS HCL 2 % MT SOLN
15.0000 mL | Freq: Once | OROMUCOSAL | Status: AC
  Administered 2017-11-28: 15 mL via OROMUCOSAL
  Filled 2017-11-28: qty 15

## 2017-11-28 NOTE — ED Triage Notes (Signed)
Pt reports mid sternal cp x yesterday, "dull, aching" pt states she had a similar pain when she had her heart attack. Pt refuses w/c from triage despite encouragement, amb to room 6 for ekg and iv access during triage. Pt denies any sob, nausea or other c/o.

## 2017-11-28 NOTE — ED Provider Notes (Signed)
[facility] Emergency Department Provider Note MRN:  [id number]  Arrival date & time: 11/28/17     Chief Complaint   Chest Pain   History of Present Illness   Veronica Wolf is a 77 y.o. year-old female with a history of GERD, CKD, COPD presenting to the ED with chief complaint of chest pain.  Pain is located in the central chest and epigastrium.  Began suddenly last night while patient was laying flat in bed.  Pain described as a burning/pressure sensation.  Denies any associated symptoms such as dizziness, diaphoresis, nausea, vomiting, shortness of breath.  Endorsing recent increase dry cough, wheezing.  Denies lower abdominal pain, no dysuria.  Was seen this morning by family medicine, who noted a soft blood pressure with 80s systolic, sent here for further evaluation.  Review of Systems  A complete 10 system review of systems was obtained and all systems are negative except as noted in the HPI and PMH.   Patient's Health History    Past Medical History:  Diagnosis Date  . Allergic rhinitis   . Anxiety   . CKD (chronic kidney disease) 04/04/2017  . COPD (chronic obstructive pulmonary disease) (HCC)   . Depression   . GERD (gastroesophageal reflux disease)   . Hyperlipidemia   . Hypertension   . Peptic ulcer 01/04/2003  . Vertigo     Past Surgical History:  Procedure Laterality Date  . ABDOMINAL HYSTERECTOMY    . LEFT HEART CATH AND CORONARY ANGIOGRAPHY N/A 06/13/2017   Procedure: LEFT HEART CATH AND CORONARY ANGIOGRAPHY;  Surgeon: Marykay LexHarding, David W, MD;  [facility];  Service: Cardiovascular;  Laterality: N/A;  . RIGHT/LEFT HEART CATH AND CORONARY ANGIOGRAPHY N/A 09/16/2017   Procedure: RIGHT/LEFT HEART CATH AND CORONARY ANGIOGRAPHY;  Surgeon: Swazil[place]Jordan, Peter M, MD;  [facility];  Service: Cardiovascular;  Laterality: N/A;  . ULTRASOUND GUIDANCE FOR VASCULAR ACCESS  09/16/2017   Procedure: Ultrasound Guidance For  Vascular Access;  Surgeon: SwazilandJordan, Peter M, MD;  [facility];  Service: Cardiovascular;;    Family History  Problem Relation Age of Onset  . Breast cancer Mother   . Stroke Father     Social History   Socioeconomic History  . Marital status: Widowed    Spouse name: Not on file  . Number of children: Not on file  . Years of education: Not on file  . Highest education level: Not on file  Occupational History  . Not on file  Social Needs  . Financial resource strain: Not on file  . Food insecurity:    Worry: Not on file    Inability: Not on file  . Transportation needs:    Medical: Not on file    Non-medical: Not on file  Tobacco Use  . Smoking status: Former Games developermoker  . Smokeless tobacco: Never Used  Substance and Sexual Activity  . Alcohol use: No    Comment: has previous hx of ETOH abuse, quit 2014  . Drug use: No  . Sexual activity: Not on file  Lifestyle  . Physical activity:    Days per week: Not on file    Minutes per session: Not on file  . Stress: Not on file  Relationships  . Social connections:    Talks on phone: Not on file    Gets together: Not on file    Attends religious service: Not on file    Active member of club or  organization: Not on file    Attends meetings of clubs or organizations: Not on file    Relationship status: Not on file  . Intimate partner violence:    Fear of current or ex partner: Not on file    Emotionally abused: Not on file    Physically abused: Not on file    Forced sexual activity: Not on file  Other Topics Concern  . 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[place]   Grew up in [place]   Has daughter locally     Physical Exam  Vital Signs and Nursing Notes reviewed Vitals:   11/28/17 1400 11/28/17 1430  BP: (!) 129/96 (!) 131/50  Pulse: (!) 56 (!) 44  Resp: 15 (!) 8  SpO2: 95% 95%    CONSTITUTIONAL: Well-appearing, NAD NEURO:  Alert and oriented x 3, no focal  deficits EYES:  eyes equal and reactive ENT/NECK:  no LAD, no JVD CARDIO: Regular rate, well-perfused, normal S1 and S2 PULM:  CTAB no wheezing or rhonchi GI/GU:  normal bowel sounds, non-distended, non-tender MSK/SPINE:  No gross deformities, no edema SKIN:  no rash, atraumatic PSYCH:  Appropriate speech and behavior  Diagnostic and Interventional Summary    EKG Interpretation  Date/Time:    Ventricular Rate:    PR Interval:    QRS Duration:   QT Interval:    QTC Calculation:   R Axis:     Text Interpretation:        Labs Reviewed  BASIC METABOLIC PANEL - Abnormal; Notable for the following components:      Result Value   Creatinine, Ser 1.68 (*)    Calcium 8.7 (*)    GFR calc non Af Amer 28 (*)    GFR calc Af Amer 33 (*)    All other components within normal limits  CBC - Abnormal; Notable for the following components:   RDW 15.8 (*)    All other components within normal limits  TROPONIN I  TROPONIN I    DG Chest 2 View  Final Result      Medications  alum & mag hydroxide-simeth (MAALOX/MYLANTA) 200-200-20 MG/5ML suspension 30 mL (30 mLs Oral Given 11/28/17 1254)  lidocaine (XYLOCAINE) 2 % viscous mouth solution 15 mL (15 mLs Mouth/Throat Given 11/28/17 1254)  aspirin chewable tablet 324 mg (324 mg Oral Given 11/28/17 1252)     Procedures Critical Care  ED Course and Medical Decision Making  I have reviewed the triage vital signs and the nursing notes.  Pertinent labs & imaging results that were available during my care of the patient were reviewed by me and considered in my medical decision making (see below for details).    77 year old female history of GERD, CHF, recent type II NSTEMI in the setting of CHF exacerbation presenting with 2 days of epigastric pain, sent here from PCP this morning, also with soft blood pressure in the 80s systolic at the clinic.  Patient had normal blood pressures during her multiple hours in the ED here.  Denies fevers, no  dysuria.  Recent cough but x-ray with no evidence of pneumonia.  Pain completely resolved with GI cocktail.  No associated symptoms to suggest ACS today.  Troponins negative x2.  Recent catheterization only 2 months ago revealing largely unremarkable coronary vasculature.  Little to no concern for ACS today.  Prescription for Carafate, will return if symptoms worsen.  After the discussed management above, the patient was  determined to be safe for discharge.  The patient was in agreement with this plan and all questions regarding their care were answered.  ED return precautions were discussed and the patient will return to the ED with any significant worsening of condition.  Elmer Sow. Pilar Plate, MD [facility] [email or web address]  Final Clinical Impressions(s) / ED Diagnoses     ICD-10-CM   1. Chest pain, unspecified type R07.9     ED Discharge Orders         Ordered    sucralfate (CARAFATE) 1 g tablet  4 times daily PRN     11/28/17 1453             Sabas Sous, MD 11/28/17 1457

## 2017-11-28 NOTE — Discharge Instructions (Addendum)
You were evaluated in the Emergency Department and after careful evaluation, we did not find any emergent condition requiring admission or further testing in the hospital.  Your symptoms today seem to be due to a flareup of your GERD.  Your lab test revealed no evidence of heart damage today.  Please continue to take your home medicines as directed.  You can try the new medication provided as needed for burning upper abdominal pain.  Please return to the Emergency Department if you experience any worsening of your condition.  We encourage you to follow up with a primary care provider.  Thank you for allowing us to be a part of your care.

## 2017-11-28 NOTE — ED Notes (Signed)
ED Provider at bedside. 

## 2017-11-28 NOTE — Patient Instructions (Signed)
Please proceed to the ER for further evaluation of your chest pain.

## 2017-11-28 NOTE — Progress Notes (Signed)
Subjective:    Patient ID: Veronica Wolf, female    DOB: 25-Apr-1940, 77 y.o.   MRN: [id number]  HPI  Veronica Wolf is a 77 yr old female who presents today for follow up. She was hospitalized in June with NSTEMI and underwent cardiac cath which noted:  Cardiac catheterization noted 20% RCA stenosis with a posterior atrial lesion 20% stenosis. Medical management was recommended.   She reports "constant chest pain" x 2 days.  She also reports a 5 pound weight gain since July.  Pain is described as substernal and "just a steady pain."  Has some epigastric discomfort as well.  Took an otc acid reflux med with only minimal improvement.  Denies associated SOB.  She does reports associated cough and wheezing.  This has been present for a few days.    Wt Readings from Last 3 Encounters:  11/28/17 200 lb 9.6 oz (91 kg)  10/03/17 194 lb 12.8 oz (88.4 kg)  09/26/17 202 lb (91.6 kg)   Other concerns today include back pain/stiffness, daytime somnolence, and cough/wheezing x 2 days. Review of Systems See HPI  Past Medical History:  Diagnosis Date  . Allergic rhinitis   . Anxiety   . CKD (chronic kidney disease) 04/04/2017  . COPD (chronic obstructive pulmonary disease) (HCC)   . Depression   . GERD (gastroesophageal reflux disease)   . Hyperlipidemia   . Hypertension   . Peptic ulcer 01/04/2003  . Vertigo      Social History   Socioeconomic History  . Marital status: Widowed    Spouse name: Not on file  . Number of children: Not on file  . Years of education: Not on file  . Highest education level: Not on file  Occupational History  . Not on file  Social Needs  . Financial resource strain: Not on file  . Food insecurity:    Worry: Not on file    Inability: Not on file  . Transportation needs:    Medical: Not on file    Non-medical: Not on file  Tobacco Use  . Smoking status: Former Games developermoker  . Smokeless tobacco: Never Used  Substance and Sexual Activity  . Alcohol use: No   Comment: has previous hx of ETOH abuse, quit 2014  . Drug use: No  . Sexual activity: Not on file  Lifestyle  . Physical activity:    Days per week: Not on file    Minutes per session: Not on file  . Stress: Not on file  Relationships  . Social connections:    Talks on phone: Not on file    Gets together: Not on file    Attends religious service: Not on file    Active member of club or organization: Not on file    Attends meetings of clubs or organizations: Not on file    Relationship status: Not on file  . Intimate partner violence:    Fear of current or ex partner: Not on file    Emotionally abused: Not on file    Physically abused: Not on file    Forced sexual activity: Not on file  Other Topics Concern  . 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 ABDOMINAL HYSTERECTOMY    . LEFT HEART CATH AND CORONARY ANGIOGRAPHY N/A 06/13/2017  Procedure: LEFT HEART CATH AND CORONARY ANGIOGRAPHY;  Surgeon: Marykay Lex, MD;  Location: [facility] INVASIVE CV LAB;  Service: Cardiovascular;  Laterality: N/A;  . RIGHT/LEFT HEART CATH AND CORONARY ANGIOGRAPHY N/A 09/16/2017   Procedure: RIGHT/LEFT HEART CATH AND CORONARY ANGIOGRAPHY;  Surgeon: Swaziland, Peter M, MD;  Location: [facility] INVASIVE CV LAB;  Service: Cardiovascular;  Laterality: N/A;  . ULTRASOUND GUIDANCE FOR VASCULAR ACCESS  09/16/2017   Procedure: Ultrasound Guidance For Vascular Access;  Surgeon: Swaziland, Peter M, MD;  Location: [facility] INVASIVE CV LAB;  Service: Cardiovascular;;    Family History  Problem Relation Age of Onset  . Breast cancer Mother   . Stroke Father     Allergies  Allergen Reactions  . Ibuprofen Other (See Comments)    Pt has hx of peptic ulcer and diverticulitis.     Current Outpatient Medications on File Prior to Visit  Medication Sig Dispense Refill  . acetaminophen  (TYLENOL) 500 MG tablet Take 500-1,000 mg by mouth 2 (two) times daily as needed for headache.    . albuterol (PROVENTIL) (2.5 MG/3ML) 0.083% nebulizer solution Take 3 mLs (2.5 mg total) by nebulization every 6 (six) hours as needed for wheezing or shortness of breath. 150 mL 1  . allopurinol (ZYLOPRIM) 100 MG tablet Take 2 tablets (200 mg total) by mouth daily. 60 tablet 6  . aspirin EC 81 MG tablet Take 1 tablet (81 mg total) by mouth daily. 90 tablet 3  . atorvastatin (LIPITOR) 40 MG tablet Take 1 tablet (40 mg total) by mouth daily at 6 PM. 30 tablet 5  . bismuth subsalicylate (PEPTO BISMOL) 262 MG/15ML suspension Take 30 mLs by mouth every 6 (six) hours as needed for indigestion or diarrhea or loose stools.    . diazepam (VALIUM) 10 MG tablet TAKE 1 TABLET BY MOUTH EVERY 12 HOURS AS NEEDED FOR ANXIETY 20 tablet 0  . dicyclomine (BENTYL) 10 MG capsule Take 1 capsule (10 mg total) by mouth 3 (three) times daily as needed for spasms. 30 capsule 1  . escitalopram (LEXAPRO) 20 MG tablet Take 1 tablet (20 mg total) by mouth daily. 30 tablet 5  . esomeprazole (NEXIUM) 20 MG capsule Take 20 mg by mouth daily as needed (GERD).    . furosemide (LASIX) 40 MG tablet Take 1 tablet (40 mg total) by mouth daily. 30 tablet 3  . HYDROcodone-acetaminophen (NORCO/VICODIN) 5-325 MG tablet Take 1 tablet by mouth every 6 (six) hours as needed for moderate pain or severe pain.    Marland Kitchen lisinopril (PRINIVIL,ZESTRIL) 2.5 MG tablet Take 1 tablet (2.5 mg total) by mouth daily. 30 tablet 5  . meclizine (ANTIVERT) 25 MG tablet TAKE 1 TABLET BY MOUTH THREE TIMES DAILY AS NEEDED FOR DIZZINESS 30 tablet 1  . metoprolol succinate (TOPROL-XL) 25 MG 24 hr tablet Take 0.5 tablets (12.5 mg total) by mouth daily. 30 tablet 5  . Multiple Vitamin (MULTIVITAMIN WITH MINERALS) TABS tablet Take 1 tablet by mouth daily.    . nitroGLYCERIN (NITROSTAT) 0.4 MG SL tablet Place 1 tablet (0.4 mg total) under the tongue every 5 (five) minutes as  needed. (Patient taking differently: Place 0.4 mg under the tongue every 5 (five) minutes as needed for chest pain. ) 11 tablet 6  . pantoprazole (PROTONIX) 40 MG tablet Take 1 tablet (40 mg total) by mouth daily. 30 tablet 5   No current facility-administered medications on file prior to visit.     BP (!) 82/52 (BP Location: Left Arm, Cuff Size:  Large)   Pulse 65   Temp 97.7 F (36.5 C) (Oral)   Resp 16   Ht 5\' 3"  (1.6 m)   Wt 200 lb 9.6 oz (91 kg)   SpO2 96%   BMI 35.53 kg/m       Objective:   Physical Exam  Constitutional: She appears well-developed and well-nourished.  Cardiovascular: Normal rate, regular rhythm and normal heart sounds.  No murmur heard. Pulmonary/Chest: Effort normal and breath sounds normal. No respiratory distress. She has no wheezes.  Abdominal:  Soft, non-tender  Psychiatric: She has a normal mood and affect. Her behavior is normal. Judgment and thought content normal.          Assessment & Plan:  Hypotension/Chest pain- known hx of non-obstructive CAD with 2 day hx of chest pain.  Hypotensive today in the office.  EKG Is performed and personally reviewed and notes more pronounced TWI in anterior leads.  Advised pt that I think it would be best to go to the ER for further evaluation and to rule out MI. She was not happy with this recommendation but ultimately agreed to go to the ER. Report was give to ED physician on duty at the [facility] ED.   BP Readings from Last 3 Encounters:  11/28/17 (!) 82/52  10/03/17 128/64  09/26/17 (!) 127/95

## 2017-11-28 NOTE — Progress Notes (Signed)
ekg 

## 2017-11-29 ENCOUNTER — Telehealth: Payer: Self-pay

## 2017-11-29 NOTE — Telephone Encounter (Signed)
Copied from CRM [id number]. Topic: Inquiry >> Nov 29, 2017  2:25 PM Windy KalataMichael, Taylor L, NT wrote: Reason for CRM: patient is calling and would like to know why she had a surgery in her groin area. She states she forgot.

## 2017-11-29 NOTE — Telephone Encounter (Signed)
Spoke with pt. Advised her surgical history doesn't indicate previous inguinal surgery other than the cardiac catheterization. Pt also wanted to verify whether or not she has had a heart attack. Advised pt per chart and hospital f/u with PCP in June that record indicates she did have a heart attack. Pt states her family told her that they were told she really didn't have one. Please advise?

## 2017-11-30 NOTE — Telephone Encounter (Signed)
Please let pt know that she had a type of heart attack called NSTEMI.  She did not have significant blockage, but blood work showed that she had heart enzyme in her blood indicating heart attack. This type is typically less severe than when there is a complete blockage.  Stress on the heart can cause this.    Irving Burtonmily, do you mind calling pt in AM in Tricia's absence please?

## 2017-12-02 NOTE — Telephone Encounter (Signed)
Attempted to reach pt, no answer and no voicemail. Will try again Tuesday as office will be closed Monday.

## 2017-12-06 NOTE — Telephone Encounter (Signed)
Attempted to reach pt No answer, no voicemail 

## 2017-12-06 NOTE — Telephone Encounter (Signed)
Pt returned my call and was notified of below. Pt concerned that someone told her son she did not have a heart attack. Advised pt I could not advise about something he may have been told but he is welcome to come with pt to her next appt if he would like to discuss this with the PCP and she voices understanding.

## 2017-12-12 ENCOUNTER — Other Ambulatory Visit: Payer: Self-pay | Admitting: Family

## 2017-12-12 NOTE — Telephone Encounter (Signed)
Last diazepam RX: 11/14/17, #20 Last OV: 11/28/17 Next OV:  None scheduled UDS: 10/31/17, moderate CSC: 03/22/17 CSR: No discrepancies identified

## 2017-12-16 ENCOUNTER — Other Ambulatory Visit: Payer: Self-pay | Admitting: Family

## 2017-12-21 ENCOUNTER — Other Ambulatory Visit: Payer: Self-pay | Admitting: *Deleted

## 2017-12-21 ENCOUNTER — Encounter: Payer: Self-pay | Admitting: *Deleted

## 2017-12-21 NOTE — Patient Outreach (Signed)
[facility]) Care Management  12/21/2017  [name] June 09, 1940 [id number]   RN Health Coach Monthly Outreach  Referral Date: 09/20/2017 Referral Source: Hospital Liaison Reason for Referral: Disease Management Education Insurance: [employer]   Outreach Attempt:  Successful telephone outreach to patient for monthly follow up.  HIPAA verified with patient.  Patient stating she is feeling a little down and sleeping a lot.  States her good friend passed away a few weeks ago.  Endorses her last medical visit resulted in her [facility] with [facility] pain and being treated for reflux.  States she had chest pain this morning that was relieved with taking Zantac.  Reports she has used the prescription of Carafate from [facility] visit and has no refills.  Encouraged patient to schedule appointment with primary care provider to discuss treatment for GERD/reflux.  States her shortness of breath is with exertion when she is running around the apartment.  Reports using her rescue inhaler about 4-5 times in the past week.    Appointments:  Patient last attended appointment with primary care provider on 11/28/2017, leading to emergency room visit.  Encouraged to arrange follow up appointment.  Encouraged to arrange Cardiology follow up appointment per last recommendations to follow up in 3 months.  Plan: RN Health Coach will follow up with [facility] Social Worker for resources for patient. RN Health Coach will make next telephone outreach to patient in the month of October.  Rhae LernerFarrah Sindy Mccune RN [facility] Care [facility]  RN Health Coach [phone] [email or web address]

## 2017-12-26 ENCOUNTER — Telehealth: Payer: Self-pay | Admitting: Family

## 2017-12-26 ENCOUNTER — Encounter: Payer: Self-pay | Admitting: *Deleted

## 2017-12-26 ENCOUNTER — Other Ambulatory Visit: Payer: Self-pay | Admitting: *Deleted

## 2017-12-26 NOTE — Patient Outreach (Signed)
[facility]) Care Management  12/26/2017  Veronica ChinaLois P Wolf [birth year] [id number]   CSW was able to make initial contact with patient today to perform phone assessment, as well as assess and assist with social work needs and services.  CSW introduced self, explained role and types of services provided through [facility] Care [facility] Care Management).  CSW further explained to patient that CSW works with [facility]'s [facility] Health Coach, also with [facility], Rhae LernerFarrah Tarpley. CSW then explained the reason for the call, indicating that Mrs. Tarpley thought that patient would benefit from social work services and resources to assist with counseling and supportive services to help cope with symptoms of anxiety and depression.  CSW obtained two HIPAA compliant identifiers from patient, which included patient's name and date of birth.  Patient admits that she could use some counseling and supportive services, as she reports feeling "down in the dumps" most days. Patient indicated that she has not sought out therapeutic services in the past, as she did not believe that she could afford the added expense each month, being on a fixed income.  CSW agreed to come out to patient's home to provide the counseling services, so an initial home visit has been scheduled with patient for Friday, January 06, 2018 at 9:00AM.  Patient reported that she has minimal family support and recently lost a friend that she was very close too.  CSW talked with patient about Grief and Loss Counseling services through [facility], but patient was not receptive.  CSW noted that patient is currently prescribed Valium 10 MG PO, daily for anxiety, as well as Lexapro 20 MG PO, daily for depression.  Patient admits to taking her antianxiety and antidepressant medication exactly as prescribed.  [facility] CM Care Plan Problem One     Most Recent Value  Care Plan Problem One   Patient is experiencing symptoms of anxiety and depression.  Role Documenting the Problem One  Clinical Social Worker  Care Plan for Problem One  Active  [facility] Long Term Goal   Patient will have a reduction in symptoms of anixety and depression, through literature, counseling and supportive services, offered by CSW, within the next 45 days.  [facility] Long Term Goal Start Date  12/26/17  Interventions for Problem One Long Term Goal  CSW will provide counseling, supportive services and literature to patient to help reduce symptoms of anxiety and depression.  [facility] CM Short Term Goal #1   Patient will review EMMI information provided to her by CSW, pertaining specifically to "Signs and Symptoms of Depression", within the next three weeks.  [facility] CM Short Term Goal #1 Start Date  12/26/17  Interventions for Short Term Goal #1  CSW will print EMMI information for patient to provide during the initial home visit.  [facility] CM Short Term Goal #2   Patient will meet with CSW for an initial home visit to receive counseling and supportive services, as well as EMMI information, within the next two weeks.  [facility] CM Short Term Goal #2 Start Date  12/26/17  Interventions for Short Term Goal #2  An initial home visit has been scheduled with patient for Friday, January 06, 2018 at 9:00AM.  [facility] CM Short Term Goal #3  Patient will practice deep breathing exercises and relaxation techniques to help cope with symptoms of anixety and depression, within the next 30 days.  [facility] CM Short Term Goal #3 Start Date  12/26/17  Interventions  for Short Tern Goal #3  CSW will teach patient proper use of deep breathing exercises and relaxation techniques for patient to perform independently.     Danford Bad, BSW, MSW, LCSW  Licensed Restaurant manager, [employer] [facility]  Mailing [place] [address], [place], [place] [address] Physical Address-[address], [place], [place] [address] Toll Free  Main # [phone] Fax # [phone] Cell # [phone]  Office # [phone] [email or web address]

## 2017-12-26 NOTE — Telephone Encounter (Signed)
[facility] would likely benefit from counseling. Could you please give her the number to [facility]?

## 2017-12-28 NOTE — Telephone Encounter (Signed)
Called patient but no answer.

## 2018-01-01 ENCOUNTER — Other Ambulatory Visit: Payer: Self-pay | Admitting: Family

## 2018-01-02 NOTE — Telephone Encounter (Signed)
Pt is requesting refill on diazepam.   Last OV: 11/28/2017 Last Fill: 12/13/2017 #20 and 0RF UDS: 10/31/2017 Moderate risk

## 2018-01-04 ENCOUNTER — Other Ambulatory Visit: Payer: Self-pay | Admitting: *Deleted

## 2018-01-04 NOTE — Telephone Encounter (Signed)
Author phoned pt X2. Pt. Stated she has an appointment with [facility] on 10/4, and will discuss with them. Pt. Declined behavior medicine phone number.

## 2018-01-04 NOTE — Patient Outreach (Signed)
[facility] Care Management  01/04/2018  ALYNNA HARGROVE [birth year] [id number]   CSW was able to make contact with patient today to explain that CSW has an opening for a home visit on Thursday, January 05, 2018 at 11:00AM and would patient like to take this appointment time.  Patient agreed; therefore, CSW will plan to meet with patient for the initial home visit on tomorrow.     Danford Bad, BSW, MSW, LCSW  Licensed Restaurant manager, [employer] food [facility]  Mailing [place] [address], [place], [place] [address] Physical Address-[address], [place], [place] [address] Toll Free Main # [phone] Fax # [phone] Cell # [phone]  Office # [phone] [email or web address]

## 2018-01-05 ENCOUNTER — Other Ambulatory Visit: Payer: Self-pay | Admitting: *Deleted

## 2018-01-05 NOTE — Patient Outreach (Signed)
[facility]) Care Management  01/05/2018  Veronica Wolf February 16, 1941 [id number]  CSW was able to meet with patient today to perform the initial home visit.  Patient reports feeling well today and appeared to be in good spirits.  Patient's home was clean and tidy, but CSW noted that patient was extremely short-of-breath and wheezing when answering the front door.  CSW inquired as to whether or not patient needed to be on portable oxygen, but patient denied, reporting, "Oh, that's just my COPD (Chronic Obstructive Pulmonary Disease).  Patient was encouraged to contact her Primary Care Physician, Dr. Debbrah Wolf regarding the wheezing, as patient inquired as to whether or not there is something that can be prescribed for her to relieve her symptoms.  CSW also encouraged patient to remember to utilize her Albuterol (Proventil) Nebulizer.  Patient admits that her depression really got bad around May of 2019 when she was visiting her sister in [place], for her 79th birthday, and developed Diverticulitis.  Patient's 10 day trip turned into a 3 day trip because she needed to return home to see a specialist.  Patient indicated that she experienced severe pain with the onset of Diverticulitis, but that her pain is now under control.  Patient reported that she has an occasional "flare-up", but on those days she just tries to relax and relies heavily on her prescription pain medication, Hydrocodone-Acetaminophen (Norco/Vicodin) 5-325 MG PO Daily, 1 tablet, every 6 hours PRN.  Patient indicated that she is good about taking her medications exactly as prescribed.  Patient reported that she would really like to become more active, get out of the house more and become more social.  Patient likes to be involved in bowling leagues but reports that she is currently unable to lift a bowling ball.  Patient hopes to join another league at the beginning of the year.  CSW spoke with patient about services  offered through Adult Day Care Programs, such as [facility]) and [facility] ([facility] [place] [facility]), for her to get involved in.  Patient denied having any interest in either program, stating, "I'd much rather be around younger people than older people and I don't like structured environments or scheduled activities".    Patient stated that she is a big reader and enjoys "losing herself in a good book".  Patient indicated that reading takes her mind off of everything and acts as a great escape for her.  Patient would really like to get involved in a book club, agreeing to outreach to friends and neighbors to see about staring her own.  Patient also agreed to inquire about already established book clubs the next time she visits her Biomedical engineer.  Patient admits that she really needs to get back into going to church and that she plans to make that a priority this Sunday morning.  Per patient, she has a great support system through family members, friends, neighbors and her church congregation.  Patient reports that she lost her best friend a few weeks ago, and that she is still trying to cope with the loss.  CSW offered counseling and supportive services, where appropriate, as well as Cognitive Behavioral Therapy.  CSW spoke with patient about Grief and Loss Counseling Services through Veronica Wolf, but patient denied.  Patient stated, "It's just going to take time for me to work through my grief".  Patient indicated that her antidepressant medication (Escitalopram-Lexapro 20 MG PO Daily)  really helps to reduce her symptoms of depression and helps her better cope with "all of life's little challenges".  Patient plans to talk with Dr. Inda Wolf about possibly increasing her dosage of Lexapro.  CSW provided patient with EMMI information pertaining specifically to "Signs and Symptoms of Depression" for her independent review.  Patient  reported that she has a daughter, Veronica Wolf and six grandchildren that she really enjoys spending time with.  However, patient does not get to see her daughter and grandchildren very often because Veronica Wolf stays so busy, but they try to at least talk on the phone daily.  Patient mentioned going to stay with her brother in [place] for a month, "just to take a vacation from everything".  CSW encouraged patient to do so, believing that a change of scenery might be beneficial for patient.  Patient indicated that she plans to call her brother this evening to talk more about it, as well as get a date on the books.  Patient talked a lot about her various trips to [place] and how relaxing they were.  Patient admits to "sleeping too much", not waking until around 1:00PM or later, each day.  Patient stated, "I think it's just because I'm bored and have nothing else to do with myself".  CSW agreed to provide patient with a list of social activities that she can get involved in right in her own community.  Patient further admitted that she tends to become anxious a lot, almost to the point of becoming scared for her life.  Patient believes that this is attributed to all the family discord that she is currently faced with, in addition to the fact that she "can't seem to get out of her own head".  CSW noted that patient takes Diazepam (Valium) 10 MG PO Daily.  CSW talked with patient about utilizing deep breathing exercises and relaxation techniques when becoming anxious and/or depressed, teaching patient proper use of both.  Patient indicated that her stress level has decreased significantly since her grandson, his girlfriend and their two children moved out of her home, in September of this year.  Patient stated, "After having lived alone for so long, it's hard to have young people live in your home again".  Patient's grandson and his family lived with patient for over a year, but have since moved out and now deny  patient access to the children.  Patient became tearful when talking about her family, requesting that she and CSW delve into that subject a little deeper during the next scheduled home visit.  CSW and patient agreed to meet again on Thursday, January 19, 2018 at 11:00AM.  [facility] CM Care Plan Problem One     Most Recent Value  Care Plan Problem One  Patient is experiencing symptoms of anxiety and depression.  Role Documenting the Problem One  Clinical Social Worker  Care Plan for Problem One  Active  [facility] Long Term Goal   Patient will have a reduction in symptoms of anixety and depression, through literature, counseling and supportive services, offered by CSW, within the next 45 days.  THN Long Term Goal Start Date  12/26/17  [facility] CM Short Term Goal #1   Patient will review EMMI information provided to her by CSW, pertaining specifically to "Signs and Symptoms of Depression", within the next three weeks.  THN CM Short Term Goal #1 Start Date  12/26/17  [facility] CM Short Term Goal #1 Met Date  01/05/18  Interventions for Short Term  Goal #1  Patient agreed to review EMMI information and report findings to CSW during the next scheduled home visit.  THN CM Short Term Goal #2   Patient will meet with CSW for an initial home visit to receive counseling and supportive services, as well as EMMI information, within the next two weeks.  [facility]N CM Short Term Goal #2 Start Date  12/26/17  [facility] CM Short Term Goal #2 Met Date  01/05/18  Interventions for Short Term Goal #2  CSW was able to meet with patient today to perform the initial home visit to provide counseling and supportive services.  Another home visit has been scheduled with patient to address symptoms of anxiety and depression.  [facility]N CM Short Term Goal #3  Patient will practice deep breathing exercises and relaxation techniques to help cope with symptoms of anixety and depression, within the next 30 days.  THN CM Short Term Goal #3 Start Date  12/26/17  [facility] CM  Short Term Goal #3 Met Date  01/05/18  Interventions for Short Tern Goal #3  Patient was taught deep breathing exercises and relaxation techniques and has agreed to continue to utilize these tools to help cope with symptoms of anxiety and depression.      Nat Christen, BSW, MSW, LCSW  Licensed Education officer, [facility]  Mailing [place] [address]. [address] [place], [place] [address] Physical Address-[address], [place], [place] [address] Toll Free Main # [phone] Fax # [phone] Cell # [phone]  Office # [phone] [email or web address]

## 2018-01-06 ENCOUNTER — Ambulatory Visit: Payer: Medicare HMO | Admitting: *Deleted

## 2018-01-09 ENCOUNTER — Other Ambulatory Visit: Payer: Self-pay | Admitting: Family

## 2018-01-09 DIAGNOSIS — I5033 Acute on chronic diastolic (congestive) heart failure: Secondary | ICD-10-CM

## 2018-01-09 DIAGNOSIS — R42 Dizziness and giddiness: Secondary | ICD-10-CM

## 2018-01-11 NOTE — Telephone Encounter (Signed)
Lasix order left pended for PCP review in light of new protocol that highlights calcium and creatinine not being within normal range from 8/26 BMP.

## 2018-01-17 ENCOUNTER — Other Ambulatory Visit: Payer: Self-pay | Admitting: *Deleted

## 2018-01-17 ENCOUNTER — Encounter: Payer: Self-pay | Admitting: *Deleted

## 2018-01-17 NOTE — Patient Outreach (Signed)
[facility]) Care Management  [place]  01/17/2018   Veronica Wolf Aug 05, 1940 [id number]   [place] Monthly Outreach   Referral Date:  09/20/2017 Referral Source:  Hospital Liaison Reason for Referral:  Disease Management Education Insurance:  [employer]    Outreach Attempt:  Successful telephone outreach to patient for monthly follow up.  HIPAA verified with patient.  Patient reporting her indigestion/reflux is a lot better.  Denies any recent episodes of indigestion.  Does report having a slight nonproductive cough.  Denies any fever or swelling in extremities or abdomen.  Patient stating she has 2 inhalers that she has been using in the recent past and feels they are out of medication.  One is Salamol Easi-Breathe CFC with an fill date on 2018 and patient stating she can not find the other inhaler.  Patient encouraged to call primary care provider and schedule an appointment and request rescue inhaler.  Reports shortness of breath with exertion only.  Not short or breath or wheezy while speaking over the phone at this time.  Discussed [facility] Pharmacist referral and patient refusing at this time.  Encounter Medications:  Outpatient Encounter Medications as of 01/17/2018  Medication Sig Note  . acetaminophen (TYLENOL) 500 MG tablet Take 500-1,000 mg by mouth 2 (two) times daily as needed for headache.   . allopurinol (ZYLOPRIM) 100 MG tablet Take 2 tablets (200 mg total) by mouth daily.   Marland Kitchen aspirin EC 81 MG tablet Take 1 tablet (81 mg total) by mouth daily.   Marland Kitchen atorvastatin (LIPITOR) 40 MG tablet Take 1 tablet (40 mg total) by mouth daily at 6 PM. 01/17/2018: Reports she forgets to take this medication  . bismuth subsalicylate (PEPTO BISMOL) 262 MG/15ML suspension Take 30 mLs by mouth every 6 (six) hours as needed for indigestion or diarrhea or loose stools.   . diazepam (VALIUM) 10 MG tablet TAKE 1 TABLET BY MOUTH EVERY 12 HOURS AS NEEDED FOR ANXIETY    . dicyclomine (BENTYL) 10 MG capsule TAKE 1 CAPSULE BY MOUTH THREE TIMES DAILY AS NEEDED FOR  SPASMS   . escitalopram (LEXAPRO) 20 MG tablet Take 1 tablet (20 mg total) by mouth daily.   . furosemide (LASIX) 40 MG tablet TAKE 1 TABLET BY MOUTH ONCE DAILY   . HYDROcodone-acetaminophen (NORCO/VICODIN) 5-325 MG tablet Take 1 tablet by mouth every 6 (six) hours as needed for moderate pain or severe pain. 10/12/2017: Reports not currently taking  . lisinopril (PRINIVIL,ZESTRIL) 2.5 MG tablet Take 1 tablet (2.5 mg total) by mouth daily.   . meclizine (ANTIVERT) 25 MG tablet TAKE 1 TABLET BY MOUTH THREE TIMES DAILY AS NEEDED FOR DIZZINESS   . metoprolol succinate (TOPROL-XL) 25 MG 24 hr tablet Take 0.5 tablets (12.5 mg total) by mouth daily. 11/15/2017: Needs to have medication refilled  . Multiple Vitamin (MULTIVITAMIN WITH MINERALS) TABS tablet Take 1 tablet by mouth daily.   . nitroGLYCERIN (NITROSTAT) 0.4 MG SL tablet Place 1 tablet (0.4 mg total) under the tongue every 5 (five) minutes as needed. (Patient taking differently: Place 0.4 mg under the tongue every 5 (five) minutes as needed for chest pain. )   . pantoprazole (PROTONIX) 40 MG tablet Take 1 tablet (40 mg total) by mouth daily.   . ranitidine (ZANTAC) 150 MG tablet Take 150 mg by mouth as needed for heartburn.   Marland Kitchen albuterol (PROVENTIL) (2.5 MG/3ML) 0.083% nebulizer solution Take 3 mLs (2.5 mg total) by nebulization every 6 (six) hours  as needed for wheezing or shortness of breath. (Patient not taking: Reported on 01/17/2018) 01/17/2018: Reports she does not have an nebulizer, may have an inhaler, she cannot find at this time  . esomeprazole (NEXIUM) 20 MG capsule Take 20 mg by mouth daily as needed (GERD).   Marland Kitchen sucralfate (CARAFATE) 1 g tablet Take 1 tablet (1 g total) by mouth 4 (four) times daily as needed. (Patient not taking: Reported on 01/17/2018) 01/17/2018: Does not have a refill   No facility-administered encounter medications on file  as of 01/17/2018.     Functional Status:  In your present state of health, do you have any difficulty performing the following activities: 12/26/2017 10/12/2017  Hearing? N N  Vision? N N  Difficulty concentrating or making decisions? N N  Walking or climbing stairs? N N  Dressing or bathing? N N  Doing errands, shopping? N N  Preparing Food and eating ? N N  Using the Toilet? N N  In the past six months, have you accidently leaked urine? N N  Do you have problems with loss of bowel control? N N  Managing your Medications? N N  Managing your Finances? N N  Housekeeping or managing your Housekeeping? N N  Some recent data might be hidden    Fall/Depression Screening: Fall Risk  01/17/2018 12/26/2017 10/12/2017  Falls in the past year? No No No   PHQ 2/9 Scores 12/26/2017 10/12/2017 09/26/2017  PHQ - 2 Score 3 0 1  PHQ- 9 Score 11 - -    THN CM Care Plan Problem One     Most Recent Value  Care Plan Problem One  Knowledge deficiet related to diagnosis of COPD and self care management of GERD  Role Documenting the Problem One  Health Jolly for Problem One  Active  THN Long Term Goal   Patient will report no hospitalizations or emergency room visits in the next 90 days.  THN Long Term Goal Start Date  01/17/18  THN Long Term Goal Met Date  01/17/18  Interventions for Problem One Long Term Goal  Reviewed and discussed goals and current care plan with patient, reviewed medications and indications and encouraged medication compliance, encouraged patient to continue to participatite with [facility] Social Worker, discussed [facility] Pharmacist referral for medication management and education (patient refused), encouraged patient to schedule appointment with primary care provider, discussed purpose of rescue inhaler and encouraged patient to discuss need with primary care provider  [facility] CM Short Term Goal #1   Patient will schedule appointment with primary care provider within the next 60 days.   THN CM Short Term Goal #1 Start Date  01/17/18  [facility] CM Short Term Goal #1 Met Date  01/17/18  Interventions for Short Term Goal #1  Reviewed last medical appointment with patient, encouraged patient to scheduled and attend appointment with primary care provider, encouraged patient to take all medications to medical appointment, encouraged patient to discuss COPD diagnosis with provider and need for inhaler or rescue inhaler, encouraged patient to discuss shortness of breath on exersion with provider  [facility] CM Short Term Goal #2   Patient will verbalize the meaning of COPD in the next 30 days.  THN CM Short Term Goal #2 Start Date  01/17/18  Interventions for Short Term Goal #2  Confirmed patient has previously mailed COPD educational material for review, encouraged patient to review educational material, discussed COPD diagnosis, meaning, and signs and symptoms with patient, encouraged patient to review  COPD action plan and zones     Appointments:  Patient last saw primary care provider on 11/28/2017 and needs to schedule follow up appointment.  Encouraged patient to do so.  Plan: RN Health Coach will send primary care provider Quarterly Update. RN Health Coach will make next telephone outreach to patient in the month of December.  [place] [phone] [email or web address]

## 2018-01-19 ENCOUNTER — Other Ambulatory Visit: Payer: Self-pay | Admitting: *Deleted

## 2018-01-19 NOTE — Patient Outreach (Signed)
[facility]) Care Management  01/19/2018  Veronica Wolf 1941/03/28 [id number]   [place] drove out to patient's home today for the routine home visit, scheduled for 01/19/2018 at 11:00 AM; however, patient was not available at the time of CSW's arrival.  CSW was able to converse with patient by phone, after patient did not answer the door.  Patient was asleep at the time of CSW's call, admitting that she had totally forgotten all about the visit. Patient requested that CSW reschedule the visit, as she reported that she was not feeling well today.  CSW voiced understanding and was agreeable to this plan.  Patient indicated that her calendar was downstairs in the kitchen and that she was upstairs lying in her bed, so she would need to call CSW back with a time and date that is convenient for her.  CSW was able to ensure that patient has the correct contact information for CSW, encouraging her to call CSW back at her earliest convenience to reschedule. [facility]N CM Care Plan Problem One     Most Recent Value  Care Plan Problem One  Patient is experiencing symptoms of anxiety and depression.  Role Documenting the Problem One  Clinical Social Worker  Care Plan for Problem One  Active  [facility] Long Term Goal   Patient will have a reduction in symptoms of anixety and depression, through literature, counseling and supportive services, offered by CSW, within the next 45 days.  THN Long Term Goal Start Date  12/26/17  [facility] CM Short Term Goal #1   Patient will review EMMI information provided to her by CSW, pertaining specifically to "Signs and Symptoms of Depression", within the next three weeks.  THN CM Short Term Goal #1 Start Date  12/26/17  [facility] CM Short Term Goal #1 Met Date  01/05/18  [facility]N CM Short Term Goal #2   Patient will meet with CSW for an initial home visit to receive counseling and supportive services, as well as EMMI information, within the next two weeks.  THN CM Short Term Goal #2 Start  Date  12/26/17  [facility] CM Short Term Goal #2 Met Date  01/05/18  THN CM Short Term Goal #3  Patient will practice deep breathing exercises and relaxation techniques to help cope with symptoms of anixety and depression, within the next 30 days.  THN CM Short Term Goal #3 Start Date  12/26/17  [facility] CM Short Term Goal #3 Met Date  01/05/18     Nat Christen, BSW, MSW, [place]  Licensed Clinical Social Worker  [place]  Mailing [place] [place], [place] [address] Physical Address-[address], [place], [place] [address] Toll Free Main # [phone] Fax # [phone] Cell # [phone]  Office # [phone] [email or web address]

## 2018-01-23 ENCOUNTER — Encounter: Payer: Self-pay | Admitting: Family

## 2018-01-23 ENCOUNTER — Ambulatory Visit (INDEPENDENT_AMBULATORY_CARE_PROVIDER_SITE_OTHER): Payer: Medicare HMO | Admitting: Family

## 2018-01-23 VITALS — BP 101/67 | HR 77 | Temp 98.1°F | Resp 16 | Ht 63.0 in | Wt 208.0 lb

## 2018-01-23 DIAGNOSIS — Z23 Encounter for immunization: Secondary | ICD-10-CM

## 2018-01-23 DIAGNOSIS — F329 Major depressive disorder, single episode, unspecified: Secondary | ICD-10-CM

## 2018-01-23 DIAGNOSIS — F419 Anxiety disorder, unspecified: Secondary | ICD-10-CM | POA: Diagnosis not present

## 2018-01-23 DIAGNOSIS — F32A Depression, unspecified: Secondary | ICD-10-CM

## 2018-01-23 DIAGNOSIS — K219 Gastro-esophageal reflux disease without esophagitis: Secondary | ICD-10-CM | POA: Diagnosis not present

## 2018-01-23 DIAGNOSIS — R062 Wheezing: Secondary | ICD-10-CM | POA: Diagnosis not present

## 2018-01-23 DIAGNOSIS — R5383 Other fatigue: Secondary | ICD-10-CM | POA: Diagnosis not present

## 2018-01-23 DIAGNOSIS — R42 Dizziness and giddiness: Secondary | ICD-10-CM

## 2018-01-23 DIAGNOSIS — J449 Chronic obstructive pulmonary disease, unspecified: Secondary | ICD-10-CM | POA: Diagnosis not present

## 2018-01-23 LAB — CBC WITH DIFFERENTIAL/PLATELET
BASOS ABS: 0.1 10*3/uL (ref 0.0–0.1)
Basophils Relative: 0.7 % (ref 0.0–3.0)
EOS PCT: 2.7 % (ref 0.0–5.0)
Eosinophils Absolute: 0.2 10*3/uL (ref 0.0–0.7)
HCT: 36.6 % (ref 36.0–46.0)
HEMOGLOBIN: 11.9 g/dL — AB (ref 12.0–15.0)
LYMPHS ABS: 1.9 10*3/uL (ref 0.7–4.0)
Lymphocytes Relative: 22.2 % (ref 12.0–46.0)
MCHC: 32.6 g/dL (ref 30.0–36.0)
MCV: 90.2 fl (ref 78.0–100.0)
MONOS PCT: 5.2 % (ref 3.0–12.0)
Monocytes Absolute: 0.4 10*3/uL (ref 0.1–1.0)
NEUTROS PCT: 69.2 % (ref 43.0–77.0)
Neutro Abs: 5.8 10*3/uL (ref 1.4–7.7)
Platelets: 190 10*3/uL (ref 150.0–400.0)
RBC: 4.05 Mil/uL (ref 3.87–5.11)
RDW: 14.9 % (ref 11.5–15.5)
WBC: 8.3 10*3/uL (ref 4.0–10.5)

## 2018-01-23 LAB — COMPREHENSIVE METABOLIC PANEL
ALBUMIN: 3.9 g/dL (ref 3.5–5.2)
ALK PHOS: 84 U/L (ref 39–117)
ALT: 5 U/L (ref 0–35)
AST: 11 U/L (ref 0–37)
BILIRUBIN TOTAL: 0.3 mg/dL (ref 0.2–1.2)
BUN: 18 mg/dL (ref 6–23)
CALCIUM: 8.7 mg/dL (ref 8.4–10.5)
CO2: 29 mEq/L (ref 19–32)
Chloride: 106 mEq/L (ref 96–112)
Creatinine, Ser: 1.53 mg/dL — ABNORMAL HIGH (ref 0.40–1.20)
GFR: 42.15 mL/min — AB (ref >60.00)
Glucose, Bld: 89 mg/dL (ref 70–99)
POTASSIUM: 3.4 meq/L — AB (ref 3.5–5.1)
Sodium: 144 mEq/L (ref 135–145)
TOTAL PROTEIN: 6.5 g/dL (ref 6.0–8.3)

## 2018-01-23 LAB — TSH: TSH: 6.46 u[IU]/mL — ABNORMAL HIGH (ref 0.35–4.50)

## 2018-01-23 MED ORDER — DIAZEPAM 10 MG PO TABS: 10.0000 mg | ORAL_TABLET | Freq: Two times a day (BID) | ORAL | 0 refills | Status: DC | PRN

## 2018-01-23 MED ORDER — ALBUTEROL SULFATE HFA 108 (90 BASE) MCG/ACT IN AERS: 2.0000 | INHALATION_SPRAY | Freq: Four times a day (QID) | RESPIRATORY_TRACT | 2 refills | Status: DC | PRN

## 2018-01-23 MED ORDER — LISINOPRIL 2.5 MG PO TABS: 2.5000 mg | ORAL_TABLET | Freq: Every day | ORAL | 5 refills | Status: DC

## 2018-01-23 MED ORDER — DICYCLOMINE HCL 10 MG PO CAPS: ORAL_CAPSULE | ORAL | 5 refills | Status: DC

## 2018-01-23 MED ORDER — MECLIZINE HCL 25 MG PO TABS: 25.0000 mg | ORAL_TABLET | Freq: Three times a day (TID) | ORAL | 5 refills | Status: DC | PRN

## 2018-01-23 NOTE — Patient Instructions (Addendum)
Please call [facility] to inquire about extractions/dentures.  [phone] You may use tums as needed Work on eating smaller meals, avoiding chocolate and late night eating.    Food Choices for Gastroesophageal Reflux Disease, Adult When you have gastroesophageal reflux disease (GERD), the foods you eat and your eating habits are very important. Choosing the right foods can help ease your discomfort. What guidelines do I need to follow?  Choose fruits, vegetables, whole grains, and low-fat dairy products.  Choose low-fat meat, fish, and poultry.  Limit fats such as oils, salad dressings, butter, nuts, and avocado.  Keep a food diary. This helps you identify foods that cause symptoms.  Avoid foods that cause symptoms. These may be different for everyone.  Eat small meals often instead of 3 large meals a day.  Eat your meals slowly, in a place where you are relaxed.  Limit fried foods.  Cook foods using methods other than frying.  Avoid drinking alcohol.  Avoid drinking large amounts of liquids with your meals.  Avoid bending over or lying down until 2-3 hours after eating. What foods are not recommended? These are some foods and drinks that may make your symptoms worse: Vegetables Tomatoes. Tomato juice. Tomato and spaghetti sauce. Chili peppers. Onion and garlic. Horseradish. Fruits Oranges, grapefruit, and lemon (fruit and juice). Meats High-fat meats, fish, and poultry. This includes hot dogs, ribs, ham, sausage, salami, and bacon. Dairy Whole milk and chocolate milk. Sour cream. Cream. Butter. Ice cream. Cream cheese. Drinks Coffee and tea. Bubbly (carbonated) drinks or energy drinks. Condiments Hot sauce. Barbecue sauce. Sweets/Desserts Chocolate and cocoa. Donuts. Peppermint and spearmint. Fats and Oils High-fat foods. This includes [place] fries and potato chips. Other Vinegar. Strong spices. This includes black pepper, white pepper, red pepper,  cayenne, curry powder, cloves, ginger, and chili powder. The items listed above may not be a complete list of foods and drinks to avoid. Contact your dietitian for more information. This information is not intended to replace advice given to you by your health care provider. Make sure you discuss any questions you have with your health care provider. Document Released: 09/21/2011 Document Revised: 08/28/2015 Document Reviewed: 01/24/2013 Elsevier Interactive Patient Education  2017 [employer].

## 2018-01-23 NOTE — Progress Notes (Signed)
Subjective:    Patient ID: Veronica Wolf, female    DOB: 1940-10-16, 77 y.o.   MRN: [id number]  HPI  Patient is a 77 yr old female who presents today with chief complaint of fatigue. Reports that "I would sleep all day if I didn't have anywhere to go."   GERD-Reports that she has some gerd symptoms.  She cannot afford nexium.    Notes that she is taking pantoprazole.   Depression- notes that she continues escitalopram. Notes that she has occasional nervousness.  Upset that her two front teeth broke off.   Notes cough/wheezing.   Review of Systems    see HPI  Past Medical History:  Diagnosis Date  . Allergic rhinitis   . Anxiety   . CKD (chronic kidney disease) 04/04/2017  . COPD (chronic obstructive pulmonary disease) (HCC)   . Depression   . GERD (gastroesophageal reflux disease)   . Hyperlipidemia   . Hypertension   . Peptic ulcer 01/04/2003  . Vertigo      Social History   Socioeconomic History  . Marital status: Widowed    Spouse name: Not on file  . Number of children: Not on file  . Years of education: Not on file  . Highest education level: Not on file  Occupational History  . Not on file  Social Needs  . Financial resource strain: Not on file  . Food insecurity:    Worry: Not on file    Inability: Not on file  . Transportation needs:    Medical: Not on file    Non-medical: Not on file  Tobacco Use  . Smoking status: Former Games developer  . Smokeless tobacco: Never Used  Substance and Sexual Activity  . Alcohol use: No    Comment: has previous hx of ETOH abuse, quit 2014  . Drug use: No  . Sexual activity: Not on file  Lifestyle  . Physical activity:    Days per week: Not on file    Minutes per session: Not on file  . Stress: Not on file  Relationships  . Social connections:    Talks on phone: Not on file    Gets together: Not on file    Attends religious service: Not on file    Active member of club or organization: Not on file    Attends  meetings of clubs or organizations: Not on file    Relationship status: Not on file  . Intimate partner violence:    Fear of current or ex partner: Not on file    Emotionally abused: Not on file    Physically abused: Not on file    Forced sexual activity: Not on file  Other Topics Concern  . 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 ABDOMINAL HYSTERECTOMY    . LEFT HEART CATH AND CORONARY ANGIOGRAPHY N/A 06/13/2017   Procedure: LEFT HEART CATH AND CORONARY ANGIOGRAPHY;  Surgeon: Marykay Lex, MD;  Location: [facility] INVASIVE CV LAB;  Service: Cardiovascular;  Laterality: N/A;  . RIGHT/LEFT HEART CATH AND CORONARY ANGIOGRAPHY N/A 09/16/2017   Procedure: RIGHT/LEFT HEART CATH AND CORONARY ANGIOGRAPHY;  Surgeon: Swaziland, Peter M, MD;  Location: [facility] INVASIVE CV LAB;  Service: Cardiovascular;  Laterality: N/A;  . ULTRASOUND GUIDANCE FOR VASCULAR ACCESS  09/16/2017   Procedure: Ultrasound Guidance  For Vascular Access;  Surgeon: Swaziland, Peter M, MD;  Location: [facility] INVASIVE CV LAB;  Service: Cardiovascular;;    Family History  Problem Relation Age of Onset  . Breast cancer Mother   . Stroke Father     Allergies  Allergen Reactions  . Ibuprofen Other (See Comments)    Pt has hx of peptic ulcer and diverticulitis.     Current Outpatient Medications on File Prior to Visit  Medication Sig Dispense Refill  . acetaminophen (TYLENOL) 500 MG tablet Take 500-1,000 mg by mouth 2 (two) times daily as needed for headache.    . albuterol (PROVENTIL) (2.5 MG/3ML) 0.083% nebulizer solution Take 3 mLs (2.5 mg total) by nebulization every 6 (six) hours as needed for wheezing or shortness of breath. 150 mL 1  . allopurinol (ZYLOPRIM) 100 MG tablet Take 2 tablets (200 mg total) by mouth daily. 60 tablet 6  . aspirin EC 81 MG tablet Take 1 tablet (81 mg total) by  mouth daily. 90 tablet 3  . atorvastatin (LIPITOR) 40 MG tablet Take 1 tablet (40 mg total) by mouth daily at 6 PM. 30 tablet 5  . bismuth subsalicylate (PEPTO BISMOL) 262 MG/15ML suspension Take 30 mLs by mouth every 6 (six) hours as needed for indigestion or diarrhea or loose stools.    . diazepam (VALIUM) 10 MG tablet TAKE 1 TABLET BY MOUTH EVERY 12 HOURS AS NEEDED FOR ANXIETY 20 tablet 0  . dicyclomine (BENTYL) 10 MG capsule TAKE 1 CAPSULE BY MOUTH THREE TIMES DAILY AS NEEDED FOR  SPASMS 30 capsule 1  . escitalopram (LEXAPRO) 20 MG tablet Take 1 tablet (20 mg total) by mouth daily. 30 tablet 5  . esomeprazole (NEXIUM) 20 MG capsule Take 20 mg by mouth daily as needed (GERD).    . furosemide (LASIX) 40 MG tablet TAKE 1 TABLET BY MOUTH ONCE DAILY 30 tablet 3  . HYDROcodone-acetaminophen (NORCO/VICODIN) 5-325 MG tablet Take 1 tablet by mouth every 6 (six) hours as needed for moderate pain or severe pain.    Marland Kitchen lisinopril (PRINIVIL,ZESTRIL) 2.5 MG tablet Take 1 tablet (2.5 mg total) by mouth daily. 30 tablet 5  . meclizine (ANTIVERT) 25 MG tablet TAKE 1 TABLET BY MOUTH THREE TIMES DAILY AS NEEDED FOR DIZZINESS 30 tablet 1  . metoprolol succinate (TOPROL-XL) 25 MG 24 hr tablet Take 0.5 tablets (12.5 mg total) by mouth daily. 30 tablet 5  . Multiple Vitamin (MULTIVITAMIN WITH MINERALS) TABS tablet Take 1 tablet by mouth daily.    . nitroGLYCERIN (NITROSTAT) 0.4 MG SL tablet Place 1 tablet (0.4 mg total) under the tongue every 5 (five) minutes as needed. (Patient taking differently: Place 0.4 mg under the tongue every 5 (five) minutes as needed for chest pain. ) 11 tablet 6  . pantoprazole (PROTONIX) 40 MG tablet Take 1 tablet (40 mg total) by mouth daily. 30 tablet 5  . ranitidine (ZANTAC) 150 MG tablet Take 150 mg by mouth as needed for heartburn.    . sucralfate (CARAFATE) 1 g tablet Take 1 tablet (1 g total) by mouth 4 (four) times daily as needed. 30 tablet 0   No current facility-administered  medications on file prior to visit.     BP 101/67 (BP Location: Right Arm, Patient Position: Sitting, Cuff Size: Small)   Pulse 77   Temp 98.1 F (36.7 C) (Oral)   Resp 16   Ht 5\' 3"  (1.6 m)   Wt 208 lb (94.3 kg)   SpO2 99%  BMI 36.85 kg/m    Objective:   Physical Exam  Constitutional: She is oriented to person, place, and time. She appears well-developed and well-nourished.  Cardiovascular: Normal rate, regular rhythm and normal heart sounds.  No murmur heard. Pulmonary/Chest: Effort normal and breath sounds normal. No respiratory distress. She has no wheezes.  Neurological: She is alert and oriented to person, place, and time.  Skin: Skin is warm and dry.  Psychiatric: She has a normal mood and affect. Her behavior is normal. Judgment and thought content normal.          Assessment & Plan:  Fatigue- I think that some of this has to do with her being bored at home. She can motivate to do something if she has an appointment. Will obtain TSH, CBC, Cmet.  Anxiety/depression- fair control. Anxiety is bothering her. She is requesting refill on her prn valium. Continue lexapro. She is upset about her teeth and I gave her a name of [employer] dent[employer] to see if they may be able to assist her with low cost dentures/extractions.  GERD- uncontrolled- discussed gerd diet. Use of prn tums, continue protonix.  Wheezing- no wheezing today on exam. Refill provided for albuterol prn.

## 2018-01-24 ENCOUNTER — Telehealth: Payer: Self-pay | Admitting: Family

## 2018-01-24 ENCOUNTER — Other Ambulatory Visit (INDEPENDENT_AMBULATORY_CARE_PROVIDER_SITE_OTHER): Payer: Medicare HMO

## 2018-01-24 DIAGNOSIS — E039 Hypothyroidism, unspecified: Secondary | ICD-10-CM

## 2018-01-24 DIAGNOSIS — D649 Anemia, unspecified: Secondary | ICD-10-CM

## 2018-01-24 DIAGNOSIS — E876 Hypokalemia: Secondary | ICD-10-CM

## 2018-01-24 LAB — FERRITIN: FERRITIN: 60.2 ng/mL (ref 10.0–291.0)

## 2018-01-24 LAB — T3, FREE: T3 FREE: 2.8 pg/mL (ref 2.3–4.2)

## 2018-01-24 LAB — T4, FREE: Free T4: 0.85 ng/dL (ref 0.60–1.60)

## 2018-01-24 LAB — IRON: Iron: 42 ug/dL (ref 42–145)

## 2018-01-24 MED ORDER — POTASSIUM CHLORIDE CRYS ER 20 MEQ PO TBCR: 20.0000 meq | EXTENDED_RELEASE_TABLET | Freq: Every day | ORAL | 3 refills | Status: DC

## 2018-01-24 NOTE — Telephone Encounter (Signed)
Notified pt. Add on form faxed to the lab. IFOB kit mailed to pt. Lab appt scheduled for 02/01/18 at 1:30pm and future lab order has been entered.

## 2018-01-24 NOTE — Telephone Encounter (Signed)
Potassium is low. I would like her to add kdur once daily and repeat bmet in 1 week please. Dx is hypokalemia.    She is mildly anemia- I would like her to complete IFOB please. Continue multivitamin with minerals.  Lets ask lab to add on serum iron and ferritin please, dx anemia.  Thyroid looks mildly underactive. Please add on free t3 and free t4 dx hypothyroid. We may add a thyroid medication after I review these findings.

## 2018-01-25 ENCOUNTER — Encounter: Payer: Self-pay | Admitting: Family

## 2018-01-25 ENCOUNTER — Encounter: Payer: Self-pay | Admitting: *Deleted

## 2018-01-25 ENCOUNTER — Other Ambulatory Visit: Payer: Self-pay | Admitting: *Deleted

## 2018-01-25 ENCOUNTER — Telehealth: Payer: Self-pay | Admitting: Family

## 2018-01-25 DIAGNOSIS — E039 Hypothyroidism, unspecified: Secondary | ICD-10-CM | POA: Insufficient documentation

## 2018-01-25 MED ORDER — LEVOTHYROXINE SODIUM 25 MCG PO TABS: 25.0000 ug | ORAL_TABLET | Freq: Every day | ORAL | 2 refills | Status: DC

## 2018-01-25 NOTE — Telephone Encounter (Signed)
Labs show mild underactive thyroid. Since she is fatigued I would like her to start a low dose thyroid medication (synthroid once daily) and plan to repeat TSH in 6 weeks. Take on empty stomach 30 minutes prior to breakfast. Don't take with other meds.

## 2018-01-25 NOTE — Patient Outreach (Signed)
[facility]) Care Management  01/25/2018  Veronica Wolf October 27, 1940 [id number]   CSW was able to make contact with patient today to follow-up regarding social work services and resources, as well as to try and reschedule the routine home visit.  Patient reported that she is doing much better and did not wish to reschedule a home visit with CSW at this time.  Patient admitted that her symptoms of anxiety and depression have definitely lessened and that she is better able to cope with life's stressors.  Patient recited CSW's contact number to CSW, agreeing to contact CSW in the near future if she wishes to receive social work services and resources.  CSW will perform a case closure on patient, as all goals of treatment have been met from social work standpoint and no additional social work needs have been identified at this time.  CSW will notify patient's [place] with [place] [facility], Hubert Azure of CSW's plans to close patient's case.  CSW will fax an update to patient's Primary Care Physician, Dr. Debbrah Alar to ensure that they are aware of CSW's involvement with patient's plan of care.    [facility] CM Care Plan Problem One     Most Recent Value  Care Plan Problem One  Patient is experiencing symptoms of anxiety and depression.  Role Documenting the Problem One  Clinical Social Worker  Care Plan for Problem One  Active  [facility] Long Term Goal   Patient will have a reduction in symptoms of anixety and depression, through literature, counseling and supportive services, offered by CSW, within the next 45 days.  [facility] Long Term Goal Start Date  12/26/17  [facility] Long Term Goal Met Date  01/25/18  Interventions for Problem One Long Term Goal  Patient reports feeling much better and indicated that she is feeling much less depressed and anxious, wanting to terminate services with CSW.  [facility] CM Short Term Goal #1   Patient will review EMMI information  provided to her by CSW, pertaining specifically to "Signs and Symptoms of Depression", within the next three weeks.  [facility] CM Short Term Goal #1 Start Date  12/26/17  [facility] CM Short Term Goal #1 Met Date  01/05/18  [facility] CM Short Term Goal #2   Patient will meet with CSW for an initial home visit to receive counseling and supportive services, as well as EMMI information, within the next two weeks.  [facility] CM Short Term Goal #2 Start Date  12/26/17  [facility] CM Short Term Goal #2 Met Date  01/05/18  [facility] CM Short Term Goal #3  Patient will practice deep breathing exercises and relaxation techniques to help cope with symptoms of anixety and depression, within the next 30 days.  [facility] CM Short Term Goal #3 Start Date  12/26/17  [facility] CM Short Term Goal #3 Met Date  01/05/18     Nat Christen, BSW, MSW, [place]  Licensed Clinical Social Worker  [place]  Mailing [place]. [address], [place], [place] [address] Physical Address-[address], [place], [place] [address] Toll Free Main # [phone] Fax # [phone] Cell # [phone]  Office # [phone] [email or web address]

## 2018-01-26 ENCOUNTER — Other Ambulatory Visit: Payer: Self-pay

## 2018-01-26 DIAGNOSIS — R7989 Other specified abnormal findings of blood chemistry: Secondary | ICD-10-CM

## 2018-01-26 NOTE — Progress Notes (Unsigned)
ts

## 2018-01-26 NOTE — Telephone Encounter (Signed)
Advised patient of results and to start Synthroid 25 mcg once daily am on empty stomach 30 min prior to breakfast.

## 2018-01-29 NOTE — Telephone Encounter (Signed)
Please schedule a 6 week follow up tsh.

## 2018-01-30 NOTE — Telephone Encounter (Signed)
Spoke with pt. Answered questions regarding both upcoming lab appts and IFOB kit. Pt voices understanding at this time.    Priority: Routine Created on: 01/30/2018 09:06 AM By: Nils Flack  Primary Information   Source Subject Topic  Veronica Wolf "Veronica Wolf (Patient) Veronica Wolf "Veronica Wolf" (Patient) General - Other  Summary: would like a call back about labs  Reason for CRM: pt would like to have call about her labs and what exactly she is supposed to do [phone]

## 2018-02-01 ENCOUNTER — Other Ambulatory Visit (INDEPENDENT_AMBULATORY_CARE_PROVIDER_SITE_OTHER): Payer: Medicare HMO

## 2018-02-01 DIAGNOSIS — E876 Hypokalemia: Secondary | ICD-10-CM | POA: Diagnosis not present

## 2018-02-01 LAB — BASIC METABOLIC PANEL
BUN: 12 mg/dL (ref 6–23)
CHLORIDE: 108 meq/L (ref 96–112)
CO2: 24 meq/L (ref 19–32)
Calcium: 9.1 mg/dL (ref 8.4–10.5)
Creatinine, Ser: 1.46 mg/dL — ABNORMAL HIGH (ref 0.40–1.20)
GFR: 44.49 mL/min — ABNORMAL LOW (ref >60.00)
GLUCOSE: 127 mg/dL — AB (ref 70–99)
POTASSIUM: 3.8 meq/L (ref 3.5–5.1)
Sodium: 142 mEq/L (ref 135–145)

## 2018-02-02 ENCOUNTER — Other Ambulatory Visit (INDEPENDENT_AMBULATORY_CARE_PROVIDER_SITE_OTHER): Payer: Medicare HMO

## 2018-02-02 DIAGNOSIS — D649 Anemia, unspecified: Secondary | ICD-10-CM | POA: Diagnosis not present

## 2018-02-02 LAB — FECAL OCCULT BLOOD, IMMUNOCHEMICAL: Fecal Occult Bld: NEGATIVE

## 2018-02-03 ENCOUNTER — Encounter: Payer: Self-pay | Admitting: Family

## 2018-02-17 ENCOUNTER — Other Ambulatory Visit: Payer: Self-pay | Admitting: Family

## 2018-03-07 ENCOUNTER — Other Ambulatory Visit: Payer: Self-pay | Admitting: Family

## 2018-03-08 NOTE — Telephone Encounter (Signed)
Last RX: 02/17/18, #30 Last OV: 01/23/18 Next OV: due 04/25/18 but not yet scheduled UDS: 10/31/17, moderate risk CSC: 03/22/17 CSR: No discrepancies identified

## 2018-03-13 ENCOUNTER — Other Ambulatory Visit (INDEPENDENT_AMBULATORY_CARE_PROVIDER_SITE_OTHER): Payer: Medicare HMO

## 2018-03-13 DIAGNOSIS — R7989 Other specified abnormal findings of blood chemistry: Secondary | ICD-10-CM

## 2018-03-13 LAB — TSH: TSH: 2.66 u[IU]/mL (ref 0.35–4.50)

## 2018-03-16 ENCOUNTER — Other Ambulatory Visit: Payer: Self-pay | Admitting: *Deleted

## 2018-03-16 NOTE — Patient Outreach (Signed)
[facility]) Care Management  03/16/2018  Juleen ChinaLois P Gillispie 20-Oct-1940 [id number]   RN Health Coach Quarterly Outreach  Referral Date: 09/20/2017 Referral Source: Hospital Liaison Reason for Referral: Disease Management Education Insurance: [employer]   Outreach Attempt:  Outreach attempt #1 to patient for quarterly follow up. No answer and unable to leave voicemail message due to voicemail did not engage.  Plan:  RN Health Coach will make another outreach attempt within the month of January.  Rhae LernerFarrah Ruthie Berch RN [facility] Care [facility]  RN Health Coach [phone] [email or web address]

## 2018-04-26 ENCOUNTER — Other Ambulatory Visit: Payer: Self-pay | Admitting: *Deleted

## 2018-04-26 NOTE — Patient Outreach (Signed)
[facility]) Care Management  04/26/2018  Veronica Wolf [birth year] [id number]   RN Health Coach Quarterly Outreach  Referral Date: 09/20/2017 Referral Source: Hospital Liaison Reason for Referral: Disease Management Education Insurance: [employer]   Outreach Attempt:  Outreach attempt #2 to patient for quarterly follow up.  Patient answered and stated she was not available to speak at this time; requested a telephone call back.   Plan:  RN Health Coach will make another outreach attempt within the month of February.    Rhae Lerner RN [facility] Care Management  RN Health Coach [phone] [email or web address]

## 2018-05-04 ENCOUNTER — Other Ambulatory Visit: Payer: Self-pay | Admitting: *Deleted

## 2018-05-04 NOTE — Patient Outreach (Signed)
[facility]) Care Management  05/04/2018  Veronica Wolf 09-26-1940 [id number]   RN Health CoachQuarterlyOutreach  Referral Date: 09/20/2017 Referral Source: Hospital Liaison Reason for Referral: Disease Management Education Insurance: [employer]   Outreach Attempt:  Outreach attempt #3 to patient for quarterly follow up. No answer and unable to leave voicemail message due to voicemail not engaging.  Plan:  RN Health Coach will send unsuccessful outreach letter to patient.  RN Health Coach will make another outreach attempt to patient within the month of February if no return call back from patient.  Rhae Lerner RN [facility] Care Management  RN Health Coach [phone] [name]

## 2018-05-11 ENCOUNTER — Other Ambulatory Visit: Payer: Self-pay | Admitting: Family

## 2018-05-30 ENCOUNTER — Other Ambulatory Visit: Payer: Self-pay | Admitting: *Deleted

## 2018-05-30 ENCOUNTER — Encounter: Payer: Self-pay | Admitting: *Deleted

## 2018-05-30 NOTE — Patient Outreach (Signed)
[facility]) Care Management  [place]  05/30/2018   Veronica Wolf 08-02-1940 [id number]   [place] Quarterly Outreach   Referral Date:  09/20/2017 Referral Source:  Hospital Liaison Reason for Referral:  Disease Management Education Insurance:  [employer]   Outreach Attempt:  Successful telephone outreach to patient for quarterly follow up.  HIPAA verified with patient.  Patient reporting she has been sick with a cold for a while now.  Increased cough and wheezing.  Reports only taking over the counter cough medication and not being able to use her rescue inhaler due to not refilling it as of yet.  Instructed and encouraged patient to request appointment with her primary care provider as soon as possible.  Also instructed and encouraged patient to contact pharmacy for a refill on her albuterol inhaler.  Patient asked "if the medication bottle says no refills, does that mean I no longer have to take the medication?".  Explained to patient she takes her medications until her primary care provider or another physician tells her to stop; if the prescription bottle says no refills the pharmacy will request new refill from her provider.  Encouraged patient to discuss her medications with her primary care provider, especially medications she is no longer taking due to now having refills.  Reports sleeping a lot, encouraged patient to discuss this with primary care provider.  Encounter Medications:  Outpatient Encounter Medications as of 05/30/2018  Medication Sig Note  . acetaminophen (TYLENOL) 500 MG tablet Take 500-1,000 mg by mouth 2 (two) times daily as needed for headache.   . albuterol (PROVENTIL HFA;VENTOLIN HFA) 108 (90 Base) MCG/ACT inhaler Inhale 2 puffs into the lungs every 6 (six) hours as needed for wheezing or shortness of breath.   Marland Kitchen aspirin EC 81 MG tablet Take 1 tablet (81 mg total) by mouth daily.   Marland Kitchen atorvastatin (LIPITOR) 40 MG tablet Take 1  tablet (40 mg total) by mouth daily at 6 PM. 01/17/2018: Reports she forgets to take this medication  . bismuth subsalicylate (PEPTO BISMOL) 262 MG/15ML suspension Take 30 mLs by mouth every 6 (six) hours as needed for indigestion or diarrhea or loose stools.   . diazepam (VALIUM) 10 MG tablet TAKE 1 TABLET BY MOUTH EVERY 12 HOURS AS NEEDED FOR ANXIETY   . dicyclomine (BENTYL) 10 MG capsule TAKE 1 CAPSULE BY MOUTH THREE TIMES DAILY AS NEEDED FOR  SPASMS   . escitalopram (LEXAPRO) 20 MG tablet TAKE 1 TABLET BY MOUTH ONCE DAILY   . furosemide (LASIX) 40 MG tablet TAKE 1 TABLET BY MOUTH ONCE DAILY   . levothyroxine (SYNTHROID) 25 MCG tablet Take 1 tablet (25 mcg total) by mouth daily before breakfast.   . lisinopril (PRINIVIL,ZESTRIL) 2.5 MG tablet Take 1 tablet (2.5 mg total) by mouth daily.   . meclizine (ANTIVERT) 25 MG tablet Take 1 tablet (25 mg total) by mouth 3 (three) times daily as needed for dizziness.   . Multiple Vitamin (MULTIVITAMIN WITH MINERALS) TABS tablet Take 1 tablet by mouth daily.   . nitroGLYCERIN (NITROSTAT) 0.4 MG SL tablet Place 1 tablet (0.4 mg total) under the tongue every 5 (five) minutes as needed. (Patient taking differently: Place 0.4 mg under the tongue every 5 (five) minutes as needed for chest pain. )   . pantoprazole (PROTONIX) 40 MG tablet Take 1 tablet (40 mg total) by mouth daily.   . potassium chloride SA (K-DUR,KLOR-CON) 20 MEQ tablet Take 1 tablet (20 mEq total)  by mouth daily.   Marland Kitchen albuterol (PROVENTIL) (2.5 MG/3ML) 0.083% nebulizer solution Take 3 mLs (2.5 mg total) by nebulization every 6 (six) hours as needed for wheezing or shortness of breath. 01/17/2018: Reports she does not have an nebulizer, may have an inhaler, she cannot find at this time  . allopurinol (ZYLOPRIM) 100 MG tablet Take 2 tablets (200 mg total) by mouth daily. (Patient not taking: Reported on 05/30/2018) 05/30/2018: Need to refill  . HYDROcodone-acetaminophen (NORCO/VICODIN) 5-325 MG tablet  Take 1 tablet by mouth every 6 (six) hours as needed for moderate pain or severe pain. 10/12/2017: Reports not currently taking  . metoprolol succinate (TOPROL-XL) 25 MG 24 hr tablet Take 0.5 tablets (12.5 mg total) by mouth daily. (Patient not taking: Reported on 05/30/2018) 05/30/2018: Never refilled   No facility-administered encounter medications on file as of 05/30/2018.     Functional Status:  In your present state of health, do you have any difficulty performing the following activities: 12/26/2017 10/12/2017  Hearing? N N  Vision? N N  Difficulty concentrating or making decisions? N N  Walking or climbing stairs? N N  Dressing or bathing? N N  Doing errands, shopping? N N  Preparing Food and eating ? N N  Using the Toilet? N N  In the past six months, have you accidently leaked urine? N N  Do you have problems with loss of bowel control? N N  Managing your Medications? N N  Managing your Finances? N N  Housekeeping or managing your Housekeeping? N N  Some recent data might be hidden    Fall/Depression Screening: Fall Risk  05/30/2018 01/17/2018 12/26/2017  Falls in the past year? 0 No No  Risk for fall due to : Medication side effect;Impaired balance/gait;Impaired mobility - -  Follow up Falls evaluation completed;Education provided;Falls prevention discussed - -   PHQ 2/9 Scores 12/26/2017 10/12/2017 09/26/2017  PHQ - 2 Score 3 0 1  PHQ- 9 Score 11 - -   [facility] CM Care Plan Problem One     Most Recent Value  Care Plan Problem One  Knowledge deficiet related to diagnosis of COPD and self care management of GERD  Role Documenting the Problem One  Health [place] for Problem One  Active  [facility] Long Term Goal   Patient will review her current medication list versus actual medications with her primary care physician in the next 60 days.  [facility] Long Term Goal Start Date  05/30/18  [facility] Long Term Goal Met Date  05/30/18  Interventions for Problem One Long Term Goal  Reviewed  medication list in system versus medications patient is currently taking, encouraged patient to take all her medications with her to her next medical appointment to review list versus medication with her primary care provider, explained to patient just because medication bottle says "no refills" does not mean she no longer needs to take medication,   [facility] CM Short Term Goal #1   Patient will schedule appointment with primary care provider within the next 30 days.  [facility] CM Short Term Goal #1 Start Date  05/30/18  Interventions for Short Term Goal #1  Discussed with patient importance of medical follow up, encouraged patient to contact primary care provider as soon as possible to request appointment, encouraged patient to seek medical attention with increased cough and wheezing,   [facility] CM Short Term Goal #2   Patient will obtain albuterol inhaler in the next 10 days.  [facility] CM Short Term  Goal #2 Start Date  05/30/18  Interventions for Short Term Goal #2  Encouraged patient to contact pharmacy and request albuterol inhaler refill, educated on importance of having inhaler available at all times      Appointments:  Patient states she will contact primary care provider for follow up appointment as soon as possible.  Plan: RN Health Coach will send primary care provide quarterly update. RN Health Coach will make next telephone outreach to patient within the month of April.  [place] [phone] [email or web address]

## 2018-06-01 ENCOUNTER — Ambulatory Visit: Payer: Self-pay

## 2018-06-01 NOTE — Telephone Encounter (Signed)
Pt. Called to report she is having "problems with my reflux." Reports she thought she had run out of her "stomach medicine, but I just now found it." Pt. Reports she will refill it and start back on her medication.Reports she has also been sleeping during the day more. No other complaints. Appointment made for follow up at pt. Request.  Answer Assessment - Initial Assessment Questions 1. NAUSEA SEVERITY: "How bad is the nausea?" (e.g., mild, moderate, severe; dehydration, weight loss)   - MILD: loss of appetite without change in eating habits   - MODERATE: decreased oral intake without significant weight loss, dehydration, or malnutrition   - SEVERE: inadequate caloric or fluid intake, significant weight loss, symptoms of dehydration     Mild 2. ONSET: "When did the nausea begin?"     1 week ago 3. VOMITING: "Any vomiting?" If so, ask: "How many times today?"     No 4. RECURRENT SYMPTOM: "Have you had nausea before?" If so, ask: "When was the last time?" "What happened that time?"     Yes 5. CAUSE: "What do you think is causing the nausea?"     Reflux 6. PREGNANCY: "Is there any chance you are pregnant?" (e.g., unprotected intercourse, missed birth control pill, broken condom)     No  Protocols used: NAUSEA-A-AH

## 2018-06-05 ENCOUNTER — Ambulatory Visit: Payer: Medicare HMO | Admitting: Family

## 2018-06-12 ENCOUNTER — Ambulatory Visit (INDEPENDENT_AMBULATORY_CARE_PROVIDER_SITE_OTHER): Payer: Medicare HMO | Admitting: Family

## 2018-06-12 VITALS — BP 95/52 | HR 61 | Temp 98.1°F | Resp 18 | Ht 63.0 in | Wt 203.2 lb

## 2018-06-12 DIAGNOSIS — R079 Chest pain, unspecified: Secondary | ICD-10-CM

## 2018-06-12 DIAGNOSIS — F329 Major depressive disorder, single episode, unspecified: Secondary | ICD-10-CM | POA: Diagnosis not present

## 2018-06-12 DIAGNOSIS — R42 Dizziness and giddiness: Secondary | ICD-10-CM

## 2018-06-12 DIAGNOSIS — F32A Depression, unspecified: Secondary | ICD-10-CM

## 2018-06-12 DIAGNOSIS — R739 Hyperglycemia, unspecified: Secondary | ICD-10-CM | POA: Diagnosis not present

## 2018-06-12 DIAGNOSIS — R0789 Other chest pain: Secondary | ICD-10-CM

## 2018-06-12 DIAGNOSIS — E785 Hyperlipidemia, unspecified: Secondary | ICD-10-CM | POA: Diagnosis not present

## 2018-06-12 LAB — HEMOGLOBIN A1C: HEMOGLOBIN A1C: 6.1 % (ref 4.6–6.5)

## 2018-06-12 LAB — BASIC METABOLIC PANEL
BUN: 16 mg/dL (ref 6–23)
CHLORIDE: 108 meq/L (ref 96–112)
CO2: 25 meq/L (ref 19–32)
Calcium: 8.7 mg/dL (ref 8.4–10.5)
Creatinine, Ser: 1.52 mg/dL — ABNORMAL HIGH (ref 0.40–1.20)
GFR: 39.92 mL/min — ABNORMAL LOW (ref >60.00)
Glucose, Bld: 74 mg/dL (ref 70–99)
POTASSIUM: 3.9 meq/L (ref 3.5–5.1)
SODIUM: 144 meq/L (ref 135–145)

## 2018-06-12 MED ORDER — LISINOPRIL 2.5 MG PO TABS: 2.5000 mg | ORAL_TABLET | Freq: Every day | ORAL | 5 refills | Status: DC

## 2018-06-12 MED ORDER — ATORVASTATIN CALCIUM 40 MG PO TABS: 40.0000 mg | ORAL_TABLET | Freq: Every day | ORAL | 5 refills | Status: DC

## 2018-06-12 MED ORDER — DIAZEPAM 10 MG PO TABS: 10.0000 mg | ORAL_TABLET | Freq: Two times a day (BID) | ORAL | 0 refills | Status: DC | PRN

## 2018-06-12 NOTE — Patient Instructions (Signed)
Please complete lab work prior to leaving.   

## 2018-06-12 NOTE — Progress Notes (Signed)
Subjective:    Patient ID: Veronica Wolf, female    DOB: 03/24/1941, 79 y.o.   MRN: [id number]  HPI  Patient is a 78 yr old female who presents today for follow up.  HTN-  BP Readings from Last 3 Encounters:  06/12/18 (!) 95/52  01/23/18 101/67  11/28/17 (!) 131/50   Hypothyroid- maintained on synthroid . Lab Results  Component Value Date   TSH 2.66 03/13/2018    Depression- reports tat her mood is "not too great at all." reports that she has had 4 deaths in the last 3 weeks back in [place].  Reports that she was very close to all of them.  One was her old boyfriend and the other two were family were sick.  Reports sleeping a lot.    Hyperlipidemia- reports that she is no longer taking atorvastatin. Unsure why she stopped.  Lab Results  Component Value Date   CHOL 314 (H) 09/15/2017   HDL 78 09/15/2017   LDLCALC 217 (H) 09/15/2017   TRIG 96 09/15/2017   CHOLHDL 4.0 09/15/2017   Lab Results  Component Value Date   HGBA1C 6.1 (H) 09/15/2017   Lab Results  Component Value Date   LDLCALC 217 (H) 09/15/2017   CREATININE 1.46 (H) 02/01/2018   Wt Readings from Last 3 Encounters:  06/12/18 203 lb 3.2 oz (92.2 kg)  01/23/18 208 lb (94.3 kg)  11/28/17 212 lb (96.2 kg)    Chest pain- reports that she has been having chest pain for 1 week intermittent.  Non -radiating, not worsened by activity. No SOB.  Review of Systems See HPI  Past Medical History:  Diagnosis Date  . Allergic rhinitis   . Anxiety   . CKD (chronic kidney disease) 04/04/2017  . COPD (chronic obstructive pulmonary disease) (HCC)   . Depression   . GERD (gastroesophageal reflux disease)   . Hyperlipidemia   . Hypertension   . Peptic ulcer 01/04/2003  . Vertigo      Social History   Socioeconomic History  . Marital status: Widowed    Spouse name: Not on file  . Number of children: Not on file  . Years of education: Not on file  . Highest education level: Not on file  Occupational  History  . Not on file  Social Needs  . Financial resource strain: Not on file  . Food insecurity:    Worry: Not on file    Inability: Not on file  . Transportation needs:    Medical: Not on file    Non-medical: Not on file  Tobacco Use  . Smoking status: Former Games developer  . Smokeless tobacco: Never Used  Substance and Sexual Activity  . Alcohol use: No    Comment: has previous hx of ETOH abuse, quit 2014  . Drug use: No  . Sexual activity: Not on file  Lifestyle  . Physical activity:    Days per week: Not on file    Minutes per session: Not on file  . Stress: Not on file  Relationships  . Social connections:    Talks on phone: Not on file    Gets together: Not on file    Attends religious service: Not on file    Active member of club or organization: Not on file    Attends meetings of clubs or organizations: Not on file    Relationship status: Not on file  . Intimate partner violence:    Fear of current or ex partner: Not  on file    Emotionally abused: Not on file    Physically abused: Not on file    Forced sexual activity: Not on file  Other Topics Concern  . 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 ABDOMINAL HYSTERECTOMY    . LEFT HEART CATH AND CORONARY ANGIOGRAPHY N/A 06/13/2017   Procedure: LEFT HEART CATH AND CORONARY ANGIOGRAPHY;  Surgeon: Marykay Lex, MD;  Location: [facility] INVASIVE CV LAB;  Service: Cardiovascular;  Laterality: N/A;  . RIGHT/LEFT HEART CATH AND CORONARY ANGIOGRAPHY N/A 09/16/2017   Procedure: RIGHT/LEFT HEART CATH AND CORONARY ANGIOGRAPHY;  Surgeon: Swaziland, Peter M, MD;  Location: [facility] INVASIVE CV LAB;  Service: Cardiovascular;  Laterality: N/A;  . ULTRASOUND GUIDANCE FOR VASCULAR ACCESS  09/16/2017   Procedure: Ultrasound Guidance For Vascular Access;  Surgeon: Swaziland, Peter M, MD;  Location: [facility]  INVASIVE CV LAB;  Service: Cardiovascular;;    Family History  Problem Relation Age of Onset  . Breast cancer Mother   . Stroke Father     Allergies  Allergen Reactions  . Ibuprofen Other (See Comments)    Pt has hx of peptic ulcer and diverticulitis.     Current Outpatient Medications on File Prior to Visit  Medication Sig Dispense Refill  . allopurinol (ZYLOPRIM) 100 MG tablet Take 200 mg by mouth daily.    Marland Kitchen aspirin EC 81 MG tablet Take 1 tablet (81 mg total) by mouth daily. 90 tablet 3  . diazepam (VALIUM) 10 MG tablet TAKE 1 TABLET BY MOUTH EVERY 12 HOURS AS NEEDED FOR ANXIETY 45 tablet 0  . dicyclomine (BENTYL) 10 MG capsule TAKE 1 CAPSULE BY MOUTH THREE TIMES DAILY AS NEEDED FOR  SPASMS 30 capsule 5  . escitalopram (LEXAPRO) 20 MG tablet TAKE 1 TABLET BY MOUTH ONCE DAILY 30 tablet 0  . levothyroxine (SYNTHROID) 25 MCG tablet Take 1 tablet (25 mcg total) by mouth daily before breakfast. 30 tablet 2  . lisinopril (PRINIVIL,ZESTRIL) 2.5 MG tablet Take 1 tablet (2.5 mg total) by mouth daily. 30 tablet 5  . meclizine (ANTIVERT) 25 MG tablet Take 1 tablet (25 mg total) by mouth 3 (three) times daily as needed for dizziness. 30 tablet 5  . metoprolol succinate (TOPROL-XL) 25 MG 24 hr tablet Take 0.5 tablets (12.5 mg total) by mouth daily. 30 tablet 5  . pantoprazole (PROTONIX) 40 MG tablet Take 1 tablet (40 mg total) by mouth daily. 30 tablet 5  . potassium chloride SA (K-DUR,KLOR-CON) 20 MEQ tablet Take 1 tablet (20 mEq total) by mouth daily. 30 tablet 3  . atorvastatin (LIPITOR) 40 MG tablet Take 1 tablet (40 mg total) by mouth daily at 6 PM. (Patient not taking: Reported on 06/12/2018) 30 tablet 5  . nitroGLYCERIN (NITROSTAT) 0.4 MG SL tablet Place 1 tablet (0.4 mg total) under the tongue every 5 (five) minutes as needed. (Patient not taking: Reported on 06/12/2018) 11 tablet 6   No current facility-administered medications on file prior to visit.     BP (!) 95/52 (BP Location: Right  Arm, Cuff Size: Large)   Pulse 61   Temp 98.1 F (36.7 C) (Oral)   Resp 18   Ht 5\' 3"  (1.6 m)   Wt 203 lb 3.2 oz (92.2 kg)   SpO2 95%   BMI 36.00 kg/m  Objective:   Physical Exam Constitutional:      Appearance: She is well-developed.  Cardiovascular:     Rate and Rhythm: Normal rate and regular rhythm.     Heart sounds: Normal heart sounds. No murmur.  Pulmonary:     Effort: Pulmonary effort is normal. No respiratory distress.     Breath sounds: Normal breath sounds. No wheezing.  Musculoskeletal:        General: No swelling.  Psychiatric:        Behavior: Behavior normal.        Thought Content: Thought content normal.        Judgment: Judgment normal.           Assessment & Plan:  Atypical chest pain-  Hx of mild CAD. Pt was given GI cocktail in the office today with complete relief of chest discomfort.  EKG tracing is personally reviewed and compared to previous EKG on file.  EKG is unchanged.   EKG notes NSR.  No acute changes. Pt is advised to go the the ED if she develops severe/recurrent chest pain that is not relieved by tums.  Will also refer back to cardiology.  Referral has been pended.  Depression- (grief reaction)- suggested counseling.  She declined at this time.   HTN- bp a little soft but asymptomatic. Will defer discontinuation of ACE/Beta blocker to cardiology since she is on low doses of both.  Hyperlipidemia- restart statin- uncontrolled. Compliance reinforced.   Vertigo- mild and intermittent.  Refill prn meclizine.   Hyperglycemia-  Lab Results  Component Value Date   HGBA1C 6.1 06/12/2018

## 2018-06-14 ENCOUNTER — Encounter: Payer: Self-pay | Admitting: Family

## 2018-06-14 ENCOUNTER — Telehealth: Payer: Self-pay | Admitting: Family

## 2018-06-14 DIAGNOSIS — R079 Chest pain, unspecified: Secondary | ICD-10-CM

## 2018-06-14 NOTE — Telephone Encounter (Signed)
Please advise pt that I would like to get her back in with her cardiologist. Referral has been pended.

## 2018-06-14 NOTE — Progress Notes (Signed)
Letter mailed out to pt

## 2018-06-16 NOTE — Telephone Encounter (Signed)
Have called patient several times, phone stays busy.

## 2018-06-16 NOTE — Telephone Encounter (Signed)
Talked to patient advised of referral due to complains of chest pain during ov.

## 2018-06-19 ENCOUNTER — Ambulatory Visit: Payer: Self-pay

## 2018-06-19 NOTE — Telephone Encounter (Signed)
Call returned to patient. Line busy

## 2018-06-19 NOTE — Telephone Encounter (Signed)
Pt was just seeking advice about tylenol that she is taking for headache.  She states that she has a long standing problem with swallowing pills and wanted advice. Pt was advised that tylenol can crushed and taken in applesauce for ease of swallowing of she could get the capsul. Pt verbalized understanding of all instructions.  Reason for Disposition . Preventing pills getting stuck in esophagus, questions about  Answer Assessment - Initial Assessment Questions 1. SYMPTOM: "Are you having difficulty swallowing liquids, solids, or both?"     tylenol 2. ONSET: "When did the swallowing problems begin?"      Have had problems with swallowing pills 3. CAUSE: "What do you think is causing the problem?"      nothing 4. CHRONIC/RECURRENT: "Is this a new problem for you?"  If no, ask: "How long have you had this problem?" (e.g., days, weeks, months)      a 5. OTHER SYMPTOMS: "Do you have any other symptoms?" (e.g., difficulty breathing, sore throat, swollen tongue, chest pain)      Chronic problem 6. PREGNANCY: "Is there any chance you are pregnant?" "When was your last menstrual period?"     N/A  Protocols used: SWALLOWING DIFFICULTY-A-AH

## 2018-06-22 ENCOUNTER — Ambulatory Visit: Payer: Medicare HMO | Admitting: Cardiology

## 2018-06-22 ENCOUNTER — Telehealth: Payer: Self-pay | Admitting: *Deleted

## 2018-06-22 NOTE — Telephone Encounter (Signed)
Patient was scheduled to see Dr Dulce Sellar today as a new patient for chest pain.  She called to the office to reschedule appointment and reported chest pain and "feeling like she was going to throw up" to [place] at front desk.    Call was transferred to [facility] CMA, and patient reported "feeling like she wants to sleep all the time."  She states, "I feel like I want to bring my food up, so I keep going to the bathroom."   Reviewed symptoms with Dr Dulce Sellar, and he advised that the cardiac consult could be moved to next week as the patient had requested, and that she should contact her PCP to make them aware of what is going on.  Patient's appointment was moved to 06-26-2018 with Dr Tomie China.  Patient agreed to plan and verbalized understanding.

## 2018-06-23 ENCOUNTER — Other Ambulatory Visit: Payer: Self-pay

## 2018-06-23 NOTE — Telephone Encounter (Signed)
Pt not willing to come [facility] office for appt added to COVID cancellation pool for rescheduling. She needs her meds refill but will call back on Monday.

## 2018-06-26 ENCOUNTER — Ambulatory Visit: Payer: Medicare HMO | Admitting: Cardiology

## 2018-07-06 ENCOUNTER — Other Ambulatory Visit: Payer: Self-pay | Admitting: Family

## 2018-07-20 ENCOUNTER — Other Ambulatory Visit: Payer: Self-pay | Admitting: *Deleted

## 2018-07-20 ENCOUNTER — Telehealth: Payer: Self-pay | Admitting: Family

## 2018-07-20 ENCOUNTER — Encounter: Payer: Self-pay | Admitting: *Deleted

## 2018-07-20 ENCOUNTER — Other Ambulatory Visit: Payer: Self-pay | Admitting: Family

## 2018-07-20 MED ORDER — PANTOPRAZOLE SODIUM 40 MG PO TBEC: 40.0000 mg | DELAYED_RELEASE_TABLET | Freq: Every day | ORAL | 3 refills | Status: DC

## 2018-07-20 NOTE — Telephone Encounter (Signed)
Requested Prescriptions  Pending Prescriptions Disp Refills  . pantoprazole (PROTONIX) 40 MG tablet 90 tablet 3    Sig: Take 1 tablet (40 mg total) by mouth daily.     Gastroenterology: Proton Pump Inhibitors Passed - 07/20/2018  4:56 PM      Passed - Valid encounter within last 12 months    Recent Outpatient Visits          1 month ago Atypical chest pain   Holiday representative at [facility], [place], NP   5 months ago Fatigue, unspecified type   Holiday representative at [employer], [place], NP   7 months ago Chest pain, unspecified type   [employer] at [employer], [place], NP   9 months ago NSTEMI (non-ST elevated myocardial infarction) [facility])   Holiday representative at [facility], [place], NP   1 year ago Depression, unspecified depression type   Holiday representative at [facility], [place]fraim Kauf[place], [place]

## 2018-07-20 NOTE — Patient Outreach (Signed)
[facility]) Care Management  [place]  07/20/2018   Veronica Wolf [birth year] [id number]   [place] Quarterly Outreach   Referral Date:  09/20/2017 Referral Source:  Hospital Liaison Reason for Referral:  Disease Management Education Insurance:  [employer] Medicare   Outreach Attempt:   Successful telephone outreach to patient for follow up.  HIPAA verified with patient.  Patient reporting she is just returning home from spending time at her daughters home.  Denies any cough, shortness of breath or fever.  Does report she has a rash under both breast and in between her legs.  States she wakes up in the mornings and has moisture under breast and between legs.  Reports she has had rash in the past and used a cream but does not remember what cream it was.  Encouraged patient to keep rash clean and dry and to contact primary care provider for appointment or prescription for cream. Patient continues to be confused about medications and medication refills.  States " I am only taking a few of my medications because all the others are completed and do not have refills".  Reviewed all medications patient reporting she has bottles for.  Discussed prepackaged medication to help with compliance and management.  Patient verbally agrees to [facility] referral.    Encounter Medications:  Outpatient Encounter Medications as of 07/20/2018  Medication Sig Note  . aspirin EC 81 MG tablet Take 1 tablet (81 mg total) by mouth daily.   . diazepam (VALIUM) 10 MG tablet Take 1 tablet (10 mg total) by mouth every 12 (twelve) hours as needed. for anxiety   . dicyclomine (BENTYL) 10 MG capsule TAKE 1 CAPSULE BY MOUTH THREE TIMES DAILY AS NEEDED FOR SPASMS   . escitalopram (LEXAPRO) 20 MG tablet Take 1 tablet by mouth once daily   . lisinopril (PRINIVIL,ZESTRIL) 2.5 MG tablet Take 1 tablet (2.5 mg total) by mouth daily.   . meclizine (ANTIVERT) 25 MG tablet Take 1 tablet (25 mg total) by  mouth 3 (three) times daily as needed for dizziness.   . metoprolol succinate (TOPROL-XL) 25 MG 24 hr tablet Take 0.5 tablets (12.5 mg total) by mouth daily. 07/20/2018: Reports taking medication  . [DISCONTINUED] pantoprazole (PROTONIX) 40 MG tablet Take 1 tablet (40 mg total) by mouth daily.   Marland Kitchen allopurinol (ZYLOPRIM) 100 MG tablet Take 200 mg by mouth daily. 07/20/2018: Reports not taking  . atorvastatin (LIPITOR) 40 MG tablet Take 1 tablet (40 mg total) by mouth daily at 6 PM. (Patient not taking: Reported on 07/20/2018) 07/20/2018: Reports not taking  . levothyroxine (SYNTHROID, LEVOTHROID) 25 MCG tablet TAKE 1 TABLET BY MOUTH ONCE DAILY BEFORE BREAKFAST (Patient not taking: Reported on 07/20/2018) 07/20/2018: Reports forgets to take medication often  . nitroGLYCERIN (NITROSTAT) 0.4 MG SL tablet Place 1 tablet (0.4 mg total) under the tongue every 5 (five) minutes as needed. (Patient not taking: Reported on 06/12/2018)   . potassium chloride SA (K-DUR,KLOR-CON) 20 MEQ tablet Take 1 tablet (20 mEq total) by mouth daily. (Patient not taking: Reported on 07/20/2018) 07/20/2018: Reports not taking and needs to get refilled   No facility-administered encounter medications on file as of 07/20/2018.     Functional Status:  In your present state of health, do you have any difficulty performing the following activities: 12/26/2017 10/12/2017  Hearing? N N  Vision? N N  Difficulty concentrating or making decisions? N N  Walking or climbing stairs? N N  Dressing or  bathing? N N  Doing errands, shopping? N N  Preparing Food and eating ? N N  Using the Toilet? N N  In the past six months, have you accidently leaked urine? N N  Do you have problems with loss of bowel control? N N  Managing your Medications? N N  Managing your Finances? N N  Housekeeping or managing your Housekeeping? N N  Some recent data might be hidden    Fall/Depression Screening: Fall Risk  07/20/2018 05/30/2018 01/17/2018  Falls in the  past year? 0 0 No  Risk for fall due to : Impaired mobility;Impaired balance/gait;Impaired vision;Medication side effect Medication side effect;Impaired balance/gait;Impaired mobility -  Follow up Falls evaluation completed;Falls prevention discussed;Education provided Falls evaluation completed;Education provided;Falls prevention discussed -   PHQ 2/9 Scores 12/26/2017 10/12/2017 09/26/2017  PHQ - 2 Score 3 0 1  PHQ- 9 Score 11 - -   THN CM Care Plan Problem One     Most Recent Value  Care Plan Problem One  Knowledge deficiet related to self management of medications  Role Documenting the Problem One  [place] for Problem One  Active  [facility] Long Term Goal   Patient will review her current medication list versus actual medications with her primary care physician in the next 60 days.  THN Long Term Goal Start Date  07/20/18  Interventions for Problem One Long Term Goal  Reviewed all medications patient has along with indications and encouraged compliance, verified if patient did take medication bottles with her to her medical appointment (states she did), discussed possible prepackaged medications to help with compliance and medication managment (patient agreeable),  placed [facility] Pharmacist referral for medication compliance and management for prepackaged medications, encouraged patient to contact primary care provider for rash under breast and on legs, encouraged patient to keep rash clean and dry  THN CM Short Term Goal #1      THN CM Short Term Goal #1 Start Date  05/30/18  [facility] CM Short Term Goal #1 Met Date  07/20/18  THN CM Short Term Goal #2   Patient will obtain albuterol inhaler in the next 10 days.  THN CM Short Term Goal #2 Start Date  07/20/18  Interventions for Short Term Goal #2  Discussed COPD and shortness of breath with patient, verified patient still does not have albuterol rescue inhaler, encouraged patient to retrieve inhaler from pharmacy as she states she needs to  pick it up, discuss purpose of rescue inhaler, encouraged patient to discuss inhaler with primary care provider     Appointments:   Patient attended appointment with primary care provider on 06/11/2017.  Plan: RN Health Coach will place [place] referral for assistance with medication management and initiating prepackaged medications. RN Health Coach will send primary care provider quarterly update. RN Health Coach will make next telephone outreach to patient with in the month of June.  [place] [phone] [email or web address]

## 2018-07-20 NOTE — Telephone Encounter (Signed)
Pt called and said that she has a rash under her breast and wants something in to her pharmacy ,she said that she was given rx for this in past but she can't remember the name of the medication .

## 2018-07-21 ENCOUNTER — Other Ambulatory Visit: Payer: Self-pay

## 2018-07-21 ENCOUNTER — Ambulatory Visit (INDEPENDENT_AMBULATORY_CARE_PROVIDER_SITE_OTHER): Payer: Medicare HMO | Admitting: Family

## 2018-07-21 ENCOUNTER — Other Ambulatory Visit: Payer: Self-pay | Admitting: Pharmacist

## 2018-07-21 DIAGNOSIS — L304 Erythema intertrigo: Secondary | ICD-10-CM | POA: Diagnosis not present

## 2018-07-21 MED ORDER — NYSTATIN 100000 UNIT/GM EX OINT: 1.0000 "application " | TOPICAL_OINTMENT | Freq: Two times a day (BID) | CUTANEOUS | 2 refills | Status: DC

## 2018-07-21 NOTE — Patient Outreach (Signed)
[facility]) Care Management  [facility] Pharmacy   07/21/2018  AMBUR HONORATO [birth year] [id number]  Reason for referral: Medication Review, Assistance with Refills and Compliance Packaging  Referral source: [facility] RN Health Coach Current insurance: [facility]  PMHx includes but not limited to:  CAD, HTN, HLD, vertigo, depression, COPD, gout, CKD-III, hypothyroidism.   Outreach:  Unsuccessful telephone call attempt #1 to patient. Unable to leave message  Plan:  -I will mail patient an unsuccessful outreach letter.  -I will make another outreach attempt to patient within 3-4 business days.    Haynes Hoehn, PharmD, [facility] Clinical Pharmacist [facility] [employer] [phone]

## 2018-07-21 NOTE — Telephone Encounter (Signed)
Patient was scheduled for virtual visit today at 2 pm

## 2018-07-21 NOTE — Progress Notes (Signed)
Virtual Visit via Video Note  I connected with Veronica Wolf on 07/21/18 at  2:00 PM EDT by a video enabled telemedicine application and verified that I am speaking with the correct person using two identifiers. This visit type was conducted due to national recommendations for restrictions regarding the COVID-19 Pandemic (e.g. social distancing).  This format is felt to be most appropriate for this patient at this time.   I discussed the limitations of evaluation and management by telemedicine and the availability of in person appointments. The patient expressed understanding and agreed to proceed.  Only the patient and myself were on today's video visit. The patient was at home and at home at the time of today's visit.   History of Present Illness:  Patient presents today with chief complaint of skin rash. Rash is located beneath both breasts.     Reports + itching and rash noted beneath both breasts L>R  Observations/Objective:  Gen: Awake, alert, no acute distress Resp: Breathing is even and non-labored Psych: calm/pleasant demeanor Neuro: Alert and Oriented x 3, + facial symmetry, speech is clear. Skin:  + erythematous rash noted beneath the left breast.  R breast not visualized on camera  Assessment and Plan:  Intertrigo- will rx with nystatin ointment. Pt is advised to blow dry beneath skin folds after bathing and apply the ointment twice daily.   Follow Up Instructions:    I discussed the assessment and treatment plan with the patient. The patient was provided an opportunity to ask questions and all were answered. The patient agreed with the plan and demonstrated an understanding of the instructions.   The patient was advised to call back or seek an in-person evaluation if the symptoms worsen or if the condition fails to improve as anticipated.    Lemont Fillers, NP

## 2018-07-25 ENCOUNTER — Other Ambulatory Visit: Payer: Self-pay | Admitting: Family

## 2018-07-26 ENCOUNTER — Other Ambulatory Visit: Payer: Self-pay | Admitting: Family

## 2018-07-27 ENCOUNTER — Ambulatory Visit: Payer: Self-pay | Admitting: Pharmacist

## 2018-07-27 ENCOUNTER — Other Ambulatory Visit: Payer: Self-pay | Admitting: Pharmacist

## 2018-07-27 NOTE — Patient Outreach (Signed)
[facility]) Care Management  [facility]  07/27/2018  KETURA SALATA 1940/11/04 [id number]   Reason for referral: Medication Assistance, Medication Review, assistance with refills and compliance packaging  Referral source: [facility] RN Current insurance: [employer]  Outreach:  Unsuccessful telephone call attempt #2 to patient.   Message left with woman who answered home phone, contact information provided.  Also attempted to call mobile phone which was busy.    Plan:  -I will make another outreach attempt to patient within 3-4 business days.    Haynes Hoehn, PharmD, [facility] Clinical Pharmacist [employer] [phone]

## 2018-07-31 ENCOUNTER — Other Ambulatory Visit: Payer: Self-pay | Admitting: Pharmacist

## 2018-07-31 ENCOUNTER — Ambulatory Visit: Payer: Self-pay | Admitting: Pharmacist

## 2018-07-31 NOTE — Patient Outreach (Signed)
[facility]) Care Management  [facility]  07/31/2018  Veronica Wolf March 04, 1941 [id number]  Reason for referral: Medication Assistance, Medication Review, assistance with refills and compliance packaging  Referral source: [facility] RN Current insurance: [employer]  Outreach:  I was able to reach Ms. Glennon today on call attempt #3 however patient states today is not a good time to review medications or discuss medication compliance.  She reports she remembers talking about this with [facility] RN Rhae Lerner however is not sure at this time if she wants to proceed.  I provided my contact information to patient in case she would like to reach out to me in the future to review medications and medication management.    Plan:  -Will close [facility] pharmacy case at this time as I have not been able to engage with patient after 3 phone call attempts and a mailed letter.  -I am happy to assist in the future as needed.    Haynes Hoehn, PharmD, [facility] Clinical Pharmacist [employer] [phone]

## 2018-08-01 ENCOUNTER — Telehealth: Payer: Self-pay | Admitting: Family

## 2018-08-01 MED ORDER — CEPHALEXIN 500 MG PO CAPS: 500.0000 mg | ORAL_CAPSULE | Freq: Three times a day (TID) | ORAL | 0 refills | Status: DC

## 2018-08-01 MED ORDER — FLUCONAZOLE 150 MG PO TABS: ORAL_TABLET | ORAL | 0 refills | Status: DC

## 2018-08-01 NOTE — Telephone Encounter (Signed)
Rx sent for diflucan once weekly x [redacted] weeks along with keflex to cover any possible skin infection. Call if increased pain/redness after starting or if rash not improved in 3-4 days.

## 2018-08-01 NOTE — Telephone Encounter (Signed)
Copied from CRM [id number]. Topic: Quick Communication - Rx Refill/Question >> Aug 01, 2018  2:41 PM Wyonia Hough E wrote: Medication: nystatin ointment (MYCOSTATIN) is not working and the rash is getting worse and now Pt is scratching it. Pt wants to know if there is something sytronger to help with the rash and pain under breast and between legs/ please advise  Preferred Pharmacy (with phone number or street name): [facility] [address] [place], [place] - [address] [phone] (Phone) [phone] (Fax)

## 2018-08-02 NOTE — Telephone Encounter (Signed)
Patient advised of prescriptions and to call us back by Friday am if not better

## 2018-08-19 ENCOUNTER — Other Ambulatory Visit: Payer: Self-pay | Admitting: Family

## 2018-08-25 ENCOUNTER — Encounter: Payer: Self-pay | Admitting: Family

## 2018-08-25 ENCOUNTER — Ambulatory Visit (INDEPENDENT_AMBULATORY_CARE_PROVIDER_SITE_OTHER): Payer: Medicare HMO | Admitting: Family

## 2018-08-25 ENCOUNTER — Other Ambulatory Visit: Payer: Self-pay

## 2018-08-25 DIAGNOSIS — L304 Erythema intertrigo: Secondary | ICD-10-CM

## 2018-08-25 MED ORDER — NYSTATIN 100000 UNIT/GM EX POWD: Freq: Four times a day (QID) | CUTANEOUS | 1 refills | Status: DC

## 2018-08-25 MED ORDER — FLUCONAZOLE 150 MG PO TABS: ORAL_TABLET | ORAL | 0 refills | Status: DC

## 2018-08-25 NOTE — Progress Notes (Signed)
Virtual Visit via Video Note  I connected with@ on 08/25/18 at 10:00 AM EDT by a video enabled telemedicine application and verified that I am speaking with the correct person using two identifiers. This visit type was conducted due to national recommendations for restrictions regarding the COVID-19 Pandemic (e.g. social distancing).  This format is felt to be most appropriate for this patient at this time.   I discussed the limitations of evaluation and management by telemedicine and the availability of in person appointments. The patient expressed understanding and agreed to proceed.  Only the patient and myself were on today's video visit. The patient was at home and I was in my office at the time of today's visit.   History of Present Illness:  Patient is a 78 yr old female who presents today to discuss rash beneath her breasts. She called on 08/01/18 with same symptoms and prescribed her diflucan once weekly for 3 weeks as well as nystatin ointment and keflex. She does not remember taking this medication.   Observations/Objective:   Gen: Awake, alert, no acute distress Resp: Breathing is even and non-labored Psych: calm/pleasant demeanor Neuro: Alert and Oriented x 3, + facial symmetry, speech is clear. Skin:  + erythema noted beneath both breast. Small area of skin breakdown under right breast. No obvious cellulitis.    Assessment and Plan: Intertrigo- will rx with diflucan once weekly x 3 weeks and nystatin powder bid.  Pt is advised to call if symptoms worsen or if symptoms fail to improve.   Follow Up Instructions:    I discussed the assessment and treatment plan with the patient. The patient was provided an opportunity to ask questions and all were answered. The patient agreed with the plan and demonstrated an understanding of the instructions.   The patient was advised to call back or seek an in-person evaluation if the symptoms worsen or if the condition fails to improve as  anticipated.    Lemont Fillers, NP

## 2018-08-29 ENCOUNTER — Other Ambulatory Visit: Payer: Self-pay | Admitting: Family

## 2018-09-19 ENCOUNTER — Other Ambulatory Visit: Payer: Self-pay | Admitting: *Deleted

## 2018-09-19 ENCOUNTER — Other Ambulatory Visit: Payer: Self-pay | Admitting: Family

## 2018-09-19 NOTE — Patient Outreach (Signed)
[place] [facility]) Care Management  09/19/2018  Veronica Wolf 1941/02/27 [id number]   [place]  Referral Date: 09/20/2017 Referral Source: Hospital Liaison Reason for Referral: Disease Management Education Insurance: [employer]   Outreach Attempt:  Outreach attempt #1 to patient for follow up. No answer. RN Health Coach left HIPAA compliant voicemail message along with contact information.  Plan:  RN Health Coach will make another outreach attempt within the month of June.   [place] Coach [phone] [email or web address]

## 2018-10-03 ENCOUNTER — Other Ambulatory Visit: Payer: Self-pay | Admitting: *Deleted

## 2018-10-03 NOTE — Patient Outreach (Signed)
[place] [facility]) Care Management  10/03/2018  Veronica Wolf May 12, 1940 [id number]   [place]  Referral Date: 09/20/2017 Referral Source: Hospital Liaison Reason for Referral: Disease Management Education Insurance: [employer] Medicare   Outreach Attempt:  Outreach attempt #2 to patient for follow up. No answer. RN Health Coach left HIPAA compliant voicemail message along with contact information.  Plan:  RN Health Coach will send unsuccessful outreach letter to patient.  RN Health Coach will make another outreach attempt to patient within the month of July.  [name] [phone] [name]

## 2018-10-06 ENCOUNTER — Other Ambulatory Visit: Payer: Self-pay | Admitting: Family

## 2018-10-19 ENCOUNTER — Other Ambulatory Visit: Payer: Self-pay

## 2018-10-19 MED ORDER — DICYCLOMINE HCL 10 MG PO CAPS: 10.0000 mg | ORAL_CAPSULE | Freq: Three times a day (TID) | ORAL | 3 refills | Status: DC

## 2018-10-23 ENCOUNTER — Other Ambulatory Visit: Payer: Self-pay | Admitting: *Deleted

## 2018-10-23 NOTE — Patient Outreach (Signed)
[place] [facility]) Care Management  10/23/2018  ARLISSA MONTEVERDE 03-14-1941 [id number]   [place]  Referral Date: 09/20/2017 Referral Source: Hospital Liaison Reason for Referral: Disease Management Education Insurance: [employer] Medicare   Outreach Attempt:  Outreach attempt #3 to patient for follow up. No answer. RN Health Coach left HIPAA compliant voicemail message along with contact information.  Plan:  RN Health Coach will make another outreach attempt within the month of August.  Dheeraj Hail RN [place] [phone] [email or web address]

## 2018-10-25 ENCOUNTER — Other Ambulatory Visit: Payer: Self-pay | Admitting: Family

## 2018-10-25 DIAGNOSIS — R42 Dizziness and giddiness: Secondary | ICD-10-CM

## 2018-11-20 ENCOUNTER — Other Ambulatory Visit: Payer: Self-pay | Admitting: Family

## 2018-11-20 ENCOUNTER — Encounter: Payer: Self-pay | Admitting: *Deleted

## 2018-11-20 ENCOUNTER — Other Ambulatory Visit: Payer: Self-pay | Admitting: *Deleted

## 2018-11-20 DIAGNOSIS — R42 Dizziness and giddiness: Secondary | ICD-10-CM

## 2018-11-20 NOTE — Patient Outreach (Signed)
[facility](THN) Care Management  [facility]  11/21/2018   Juleen ChinaLois P Pantaleo 16-Oct-1940 [id number]   RN Health Coach Monthly Outreach   Referral Date:  09/20/2017 Referral Source:  Hospital Liaison Reason for Referral:  Disease Management Education Insurance:  [employer] Medicare   Outreach Attempt:  Successful telephone outreach to patient for follow up.  HIPAA verified with patient.  Reports her home telephone has been broken for the past few months, which is why it was difficult to reach patient.  Did give this RN Health Coach her cell phone number [phone]).  Information updated in the system.  Patient reporting she feels like she has a cold, with symptoms of sneezing, runny nose, cough, wheezing, and shortness of breath with exertion.  States she has just started taking Zyrtec to help, but is out of her Ventolin rescue inhaler.  Encouraged patient to refill her inhaler as soon as possible; patient stating the refill is costly at $45 per prescription.  Encouraged patient to contact primary care provider for guidance. Reviewed medications with patient.  Patient continues to be confused about her medications and how/when to obtain refills.  Reports she has been out of her Metoprolol for about a week or so.  Admits she has not been taking her Synthroid because she forgets to take it(actually had a hard time finding the prescription bottle) and frequently forgets to take her Lipitor.  [facility] services reviewed and discussed.  Patient agreeable for [facility] referral for medication assistance and prepackaged medications to help with medication compliance.  Instructed and encouraged patient to answer [facility] so they can assist her.  Patient can be heard short of breath and wheezing when returning from getting medication bottles.  Recovered within about a minute of resting.  Encounter Medications:  Outpatient Encounter Medications as of 11/20/2018  Medication Sig  Note  . aspirin EC 81 MG tablet Take 1 tablet (81 mg total) by mouth daily.   Marland Kitchen. atorvastatin (LIPITOR) 40 MG tablet Take 1 tablet (40 mg total) by mouth daily at 6 PM.   . cetirizine (ZYRTEC) 10 MG tablet Take 10 mg by mouth as needed for allergies.   . diazepam (VALIUM) 10 MG tablet TAKE 1 TABLET BY MOUTH EVERY 12 HOURS AS NEEDED FOR ANXIETY   . dicyclomine (BENTYL) 10 MG capsule Take 1 capsule (10 mg total) by mouth 4 (four) times daily -  before meals and at bedtime.   Marland Kitchen. escitalopram (LEXAPRO) 20 MG tablet Take 1 tablet by mouth once daily   . lisinopril (ZESTRIL) 2.5 MG tablet Take 1 tablet by mouth once daily   . pantoprazole (PROTONIX) 40 MG tablet Take 1 tablet (40 mg total) by mouth daily.   Marland Kitchen. allopurinol (ZYLOPRIM) 100 MG tablet Take 200 mg by mouth daily. 07/20/2018: Reports not taking  . fluconazole (DIFLUCAN) 150 MG tablet Take 1 tablet by mouth once weekly for 3 weeks. (Patient not taking: Reported on 11/20/2018)   . levothyroxine (SYNTHROID) 25 MCG tablet TAKE 1 TABLET BY MOUTH ONCE DAILY BEFORE BREAKFAST (Patient not taking: Reported on 11/20/2018) 11/20/2018: Reports she has been forgetting to take it  . meclizine (ANTIVERT) 25 MG tablet TAKE 1 TABLET BY MOUTH THREE TIMES DAILY AS NEEDED FOR  DIZZINESS 11/20/2018: Needs refill  . metoprolol succinate (TOPROL-XL) 25 MG 24 hr tablet Take 1/2 (one-half) tablet by mouth once daily 11/20/2018: Needs refill  . nitroGLYCERIN (NITROSTAT) 0.4 MG SL tablet Place 1 tablet (0.4 mg total) under  the tongue every 5 (five) minutes as needed.   . nystatin (MYCOSTATIN/NYSTOP) powder Apply topically 4 (four) times daily.   . potassium chloride SA (K-DUR,KLOR-CON) 20 MEQ tablet Take 1 tablet (20 mEq total) by mouth daily. 07/20/2018: Reports not taking and needs to get refilled  . VENTOLIN HFA 108 (90 Base) MCG/ACT inhaler as needed. 11/20/2018: Needs to be refilled   No facility-administered encounter medications on file as of 11/20/2018.     Functional  Status:  In your present state of health, do you have any difficulty performing the following activities: 11/20/2018 12/26/2017  Hearing? N N  Vision? N N  Difficulty concentrating or making decisions? Y N  Comment memory problems -  Walking or climbing stairs? N N  Dressing or bathing? N N  Doing errands, shopping? N N  Preparing Food and eating ? N N  Using the Toilet? N N  In the past six months, have you accidently leaked urine? N N  Do you have problems with loss of bowel control? N N  Managing your Medications? Y N  Comment forgetting to take medications and refill medications -  Managing your Finances? N N  Housekeeping or managing your Housekeeping? N N  Some recent data might be hidden    Fall/Depression Screening: Fall Risk  11/20/2018 07/20/2018 05/30/2018  Falls in the past year? 0 0 0  Risk for fall due to : Mental status change;Impaired balance/gait;Medication side effect;Impaired vision Impaired mobility;Impaired balance/gait;Impaired vision;Medication side effect Medication side effect;Impaired balance/gait;Impaired mobility  Follow up Education provided;Falls prevention discussed;Falls evaluation completed Falls evaluation completed;Falls prevention discussed;Education provided Falls evaluation completed;Education provided;Falls prevention discussed   PHQ 2/9 Scores 12/26/2017 10/12/2017 09/26/2017  PHQ - 2 Score 3 0 1  PHQ- 9 Score 11 - -   [facility] CM Care Plan Problem One     Most Recent Value  Care Plan Problem One  Knowledge deficiet related to self management of medications  Role Documenting the Problem One  [place] for Problem One  Active  [facility] Long Term Goal   Patient will review her current medication list versus actual medications with her primary care physician in the next 60 days.  [facility] Long Term Goal Start Date  11/20/18  Interventions for Problem One Long Term Goal  Care plan and goals reviewed and discussed, encouraged patient to contact pcp for  appointment as soon as possible with shortness of breath on exersion and wheezing, discussed importance of keeping rescue inhaler on hand, reviewed which medications patient has versus which she needs refilled versus which she is actually taking, encouraged patient to review medications with her primary care provider, encouraged patient to obtain refill for toprol as soon as possible, discussed with patient importance of synthroid medication  [facility] CM Short Term Goal #1   Patient will work with [place] to arrange prepackaged medications within the next 30 days.  [facility] CM Short Term Goal #1 Start Date  11/20/18  Interventions for Short Term Goal #1  Reviewed medications and indications and encouraged medication compliance, [facility] Pharmacy referral for prepackaged medication to help with medication compliance, discussed importance of medication compliance, encouaged patient to answer [place] call to get medication assistance, described and discussed the process of prepackaged medication with patient  [facility] CM Short Term Goal #2   Patient will obtain albuterol inhaler in the next 10 days.  [facility] CM Short Term Goal #2 Start Date  11/20/18  Interventions for Short Term Goal #2  Discussed importance of rescue inhaler, encouraged patient to obtain new inhaler from pharmacy, discussed use of inhaler related to shortness of breath and wheezing, [facility] referral for medication assistance for inhaler,      Appointments:  Patient last attended appointment/telehealth with primary care provider on 08/25/2018.  Encouraged patient to contact primary care for appointment as soon as possible.  Plan: RN Health Coach will place [facility] for medication cost assistance and prepackaged medications to increase medication compliance. RN Health Coach will send primary care provider Quarterly Update. RN Health Coach will make next telephone outreach to patient within the month of September.  Rhae LernerFarrah Tori Dattilio  RN [facility] Care Management  RN Health Coach [phone] [email or web address]

## 2018-11-21 ENCOUNTER — Telehealth: Payer: Self-pay | Admitting: Pharmacist

## 2018-11-21 NOTE — Patient Outreach (Signed)
[facility](THN) Care Management  11/21/2018  Veronica Wolf 1940/08/12 [id number]  Called patient regarding medication assistance and pill pack referral. HIPAA identifiers were obtained. After explaining who I was and the purpose of my call. The patient asked me to call her back in thirty minutes.  Plan: Call patient back later today based on other calls.  Beecher McardleKatina J. Mandeep Kiser, PharmD, BCACP [facility] Clinical Pharmacist [phone]  ADDENDUM  Called patient back.  [facility]) Care Management  [facility]     Veronica Wolf 1940/08/12 [id number]  Reason for referral: medication assistance and pill packing  Referral source: [facility] Health Coach Referral medication(s): all Current insurance: [employer]  HPI:  Patient is a 78 year old female with multiple medical conditions including but not limited to: Allergic rhinitis, CKD stage 3, COPD, Depression , anxiety, GERD, GOUT, hyperlipidemia, hypothyroidism and  history of NSTEMI, Patient reported her inhaler has a $45 copay.   Objective: Allergies  Allergen Reactions  . Ibuprofen Other (See Comments)    Pt has hx of peptic ulcer and diverticulitis.     Medications Reviewed Today    Reviewed by Beecher McardleBoyd, Alieu Finnigan J, [facility] (Pharmacist) on 11/21/18 at 1450  Med List Status: <None>  Medication Order Taking? Sig Documenting Provider Last Dose Status Informant  allopurinol (ZYLOPRIM) 100 MG tablet [id number] Yes Take 200 mg by mouth daily. [provider] Taking Active            Med Note Beecher Mcardle(Dyan Labarbera J   Tue Nov 21, 2018  2:40 PM) Needs to get a refill   aspirin EC 81 MG tablet [id number] Yes Take 1 tablet (81 mg total) by mouth daily. Revankar, Aundra Dubinajan R, MD Taking Active Self  atorvastatin (LIPITOR) 40 MG tablet [id number] Yes Take 1 tablet (40 mg total) by mouth daily at 6 PM. [name]'Sullivan, Melissa, NP Taking Active            Med Note Davina Poke(TARPLEY, FARRAH D   Mon Nov 20, 2018  4:37 PM)    cetirizine (ZYRTEC)  10 MG tablet [id number] Yes Take 10 mg by mouth as needed for allergies. [provider] Taking Active Self  diazepam (VALIUM) 10 MG tablet [id number] Yes TAKE 1 TABLET BY MOUTH EVERY 12 HOURS AS NEEDED FOR ANXIETY [name]'Sullivan, Melissa, NP Taking Active   dicyclomine (BENTYL) 10 MG capsule [id number] Yes Take 1 capsule (10 mg total) by mouth 4 (four) times daily -  before meals and at bedtime. [name]'Sullivan, Melissa, NP Taking Active   escitalopram (LEXAPRO) 20 MG tablet [id number] Yes Take 1 tablet by mouth once daily [name]'Sullivan, Melissa, NP Taking Active   fluconazole (DIFLUCAN) 150 MG tablet [id number]  Take 1 tablet by mouth once weekly for 3 weeks.  Patient not taking: Reported on 11/20/2018   [name]'Sullivan, Melissa, NP  Active   levothyroxine (SYNTHROID) 25 MCG tablet [id number]  TAKE 1 TABLET BY MOUTH ONCE DAILY BEFORE BREAKFAST  Patient not taking: Reported on 11/20/2018   [name]'Sullivan, Melissa, NP  Active            Med Note Davina Poke(TARPLEY, FARRAH D   Mon Nov 20, 2018  4:50 PM) Reports she has been forgetting to take it  lisinopril (ZESTRIL) 2.5 MG tablet [id number] Yes Take 1 tablet by mouth once daily [name]'Sullivan, Melissa, NP Taking Active   meclizine (ANTIVERT) 25 MG tablet [id number] Yes TAKE 1 TABLET BY MOUTH THREE TIMES DAILY AS NEEDED FOR  DIZZINESS [name]'Sullivan, Melissa, NP Taking Active  Med Note Hubert Azure D   Mon Nov 20, 2018  4:40 PM) Needs refill  metoprolol succinate (TOPROL-XL) 25 MG 24 hr tablet [id number] Yes Take 1/2 (one-half) tablet by mouth once daily Debbrah Alar, NP Taking Active            Med Note Hubert Azure D   Mon Nov 20, 2018  4:40 PM) Needs refill  nitroGLYCERIN (NITROSTAT) 0.4 MG SL tablet [id number]  Place 1 tablet (0.4 mg total) under the tongue every 5 (five) minutes as needed. Jenean Lindau, MD  Expired 10/04/18 2359 Self  nystatin (MYCOSTATIN/NYSTOP) powder [id number] Yes Apply topically 4 (four) times daily. Debbrah Alar, NP Taking  Active   pantoprazole (PROTONIX) 40 MG tablet [id number] Yes Take 1 tablet (40 mg total) by mouth daily. Debbrah Alar, NP Taking Active   potassium chloride SA (K-DUR,KLOR-CON) 20 MEQ tablet [id number] Yes Take 1 tablet (20 mEq total) by mouth daily. Debbrah Alar, NP Taking Active            Med Note [place], [facility] D   Thu Jul 20, 2018  4:03 PM) Reports not taking and needs to get refilled  VENTOLIN HFA 108 (90 Base) MCG/ACT inhaler [id number] Yes as needed. [provider] Taking Active            Med Note Sadie Haber Nov 20, 2018  4:52 PM) Needs to be refilled          Assessment:  Drugs sorted by system:  Neurologic/Psychologic: Diazepam, Escitalopram,   Cardiovascular: Aspirin, Atorvastatin, Lisinopril, Metoprolol Succinate, Nitroglycerin  Pulmonary/Allergy: Cetirizine, Ventolin HFA,   Gastrointestinal: Dicyclomine, Pantoprazole, Meclizine,   Endocrine: Levothyroxine  Renal: Allopurinol  Topical: Nystatin powder  Vitamins/Minerals/Supplements: Potassium Chloride  Miscellaneous:  Medication Review Findings:  . Adherence-patient reported needing refills on: allopurinol, Levothyroxine, Metoprolol succinate, Potassium Chloride and Ventolin HFA o It is unclear from the patient if these medications have been requested from her pharmacy. Marland Kitchen Spoke with the patient at length about transitioning to pill packing but she did not want to leave her current pharmacy and they do not offer the service.  . According to [facility]  o Metoprolol Succinate-last filled 10/07/2018 out of refills will send request to provider o Levothyroxine was filled 11/20/2018 o Allopurinol was last filled 03/23/2017 o Potassium Chloride -last filled 07/26/18 -out of refills-- they will send a refill request . K from 06/2018 3.9  Medication Assistance Findings:  Medication assistance needs identified: Ventolin HFA   Patient did not think she could find her Social  Film/video editor. She said her son-in-law takes care of her affairs.  She also did not know exactly how much she makes every month so we could not assess for Extra Help.  Since she did not think she could locate the financial documentation, patient will be sent a [employer] Patient Assistance Application to get Proventil HFA.   Patient said she would have her son-in-law look at the application for [employer].   Additional medication assistance options reviewed with patient as warranted:  No other options identified  Plan: I will route patient assistance letter to [place] technician who will coordinate patient assistance program application process for medications listed above.  [facility] pharmacy technician will assist with obtaining all required documents from both patient and provider(s) and submit application(s) once completed.    Follow up with patient in 2-3 weeks.  Elayne Guerin, PharmD, [place] Clinical Pharmacist [phone]

## 2018-11-22 ENCOUNTER — Other Ambulatory Visit: Payer: Self-pay | Admitting: Family

## 2018-11-23 ENCOUNTER — Other Ambulatory Visit: Payer: Self-pay | Admitting: Pharmacy Technician

## 2018-11-23 ENCOUNTER — Encounter: Payer: Self-pay | Admitting: Pharmacist

## 2018-11-23 NOTE — Patient Outreach (Signed)
[place] [facility]) Care Management  11/23/2018  Veronica Wolf [birth year] [id number]                           Medication Assistance Referral  Referral From: [place]  Medication/Company: Proventil HFA / [employer] Patient application portion:  Education officer, museum portion: Magazine features editor to Debbrah Alar, NP    Follow up:  Will follow up with patient in 5-10 business days to confirm application(s) have been received.  Marcin Holte P. Isobel Eisenhuth, [place] Management [phone]

## 2018-12-01 ENCOUNTER — Other Ambulatory Visit: Payer: Self-pay | Admitting: Family

## 2018-12-06 ENCOUNTER — Other Ambulatory Visit: Payer: Self-pay | Admitting: Pharmacy Technician

## 2018-12-06 NOTE — Patient Outreach (Signed)
[facility]) Care Management  12/06/2018  Veronica Wolf 1941/02/14 [id number]  Unsuccessful outreach call placed to patient in regards to [employer] application for Proventil HFA.  Unfortunately the 1st time I called this morning,  patient was unable to speak to me due to being an inconvenient time. She informed to call back this afternoon.  Called back this afternoon and the call went to voicemail. Left patient a HIPAA compliant voicemail.  Was calling patient to inquire if she had received the application that was mailed to her on 11/23/2018.  Will followup with 2nd outreach attempt in 5-7 business days if call is not returned.  Oshea Percival P. Snyder Colavito, [place] Management [phone]

## 2018-12-08 ENCOUNTER — Ambulatory Visit (INDEPENDENT_AMBULATORY_CARE_PROVIDER_SITE_OTHER): Payer: Medicare HMO | Admitting: Family

## 2018-12-08 ENCOUNTER — Encounter: Payer: Self-pay | Admitting: Neurology

## 2018-12-08 ENCOUNTER — Other Ambulatory Visit: Payer: Self-pay

## 2018-12-08 DIAGNOSIS — J449 Chronic obstructive pulmonary disease, unspecified: Secondary | ICD-10-CM

## 2018-12-08 DIAGNOSIS — R413 Other amnesia: Secondary | ICD-10-CM | POA: Diagnosis not present

## 2018-12-08 DIAGNOSIS — J309 Allergic rhinitis, unspecified: Secondary | ICD-10-CM

## 2018-12-08 MED ORDER — ALBUTEROL SULFATE (2.5 MG/3ML) 0.083% IN NEBU: 2.5000 mg | INHALATION_SOLUTION | Freq: Four times a day (QID) | RESPIRATORY_TRACT | 1 refills | Status: DC | PRN

## 2018-12-08 MED ORDER — METOPROLOL SUCCINATE ER 25 MG PO TB24: ORAL_TABLET | ORAL | 5 refills | Status: DC

## 2018-12-08 MED ORDER — POTASSIUM CHLORIDE CRYS ER 20 MEQ PO TBCR: 20.0000 meq | EXTENDED_RELEASE_TABLET | Freq: Every day | ORAL | 5 refills | Status: DC

## 2018-12-08 MED ORDER — LEVOTHYROXINE SODIUM 25 MCG PO TABS: ORAL_TABLET | ORAL | 5 refills | Status: DC

## 2018-12-08 NOTE — Progress Notes (Signed)
Virtual Visit via Video Note  I connected with Veronica Wolf on 12/08/18 at  1:00 PM EDT by a video enabled telemedicine application and verified that I am speaking with the correct person using two identifiers.  Location: Patient: home Provider: office   I discussed the limitations of evaluation and management by telemedicine and the availability of in person appointments. The patient expressed understanding and agreed to proceed.  History of Present Illness:  Patient reports that she is having intermittent coughing spasms.  Reports mild wheezing.  Cough began about 1 week ago.  She reports associate "runny nose." Denies fever, body aches, sore throat, or loss of taste or smell.  Requesting albuterol.  She can't afford the $48 copay for albuterol MDI.  Has a broken nebulizer machine. Has the pt assist form for albuterol but has not yet submitted.     Past Medical History:  Diagnosis Date  . Allergic rhinitis   . Anxiety   . CKD (chronic kidney disease) 04/04/2017  . COPD (chronic obstructive pulmonary disease) ([place])   . Depression   . GERD (gastroesophageal reflux disease)   . Hyperlipidemia   . Hypertension   . Peptic ulcer 01/04/2003  . Vertigo      Social History   Socioeconomic History  . Marital status: Widowed    Spouse name: Not on file  . Number of children: Not on file  . Years of education: Not on file  . Highest education level: Not on file  Occupational History  . Not on file  Social Needs  . Financial resource strain: Somewhat hard  . Food insecurity    Worry: Never true    Inability: Never true  . Transportation needs    Medical: No    Non-medical: No  Tobacco Use  . Smoking status: Former Research scientist (life sciences)  . Smokeless tobacco: Never Used  Substance and Sexual Activity  . Alcohol use: No    Comment: has previous hx of ETOH abuse, quit 2014  . Drug use: No  . Sexual activity: Not on file  Lifestyle  . Physical activity    Days per week: 0 days   Minutes per session: 0 min  . Stress: Rather much  Relationships  . Social Herbalist on phone: Not on file    Gets together: Not on file    Attends religious service: Not on file    Active member of club or organization: Not on file    Attends meetings of clubs or organizations: Not on file    Relationship status: Not on file  . Intimate partner violence    Fear of current or ex partner: Not on file    Emotionally abused: Not on file    Physically abused: Not on file    Forced sexual activity: Not on file  Other Topics Concern  . 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 ABDOMINAL HYSTERECTOMY    . LEFT HEART CATH AND CORONARY ANGIOGRAPHY N/A 06/13/2017   Procedure: LEFT HEART CATH AND CORONARY ANGIOGRAPHY;  Surgeon: Leonie Man, MD;  Location: [place] [facility];  Service: Cardiovascular;  Laterality: N/A;  . RIGHT/LEFT HEART CATH AND CORONARY ANGIOGRAPHY N/A 09/16/2017   Procedure: RIGHT/LEFT HEART CATH AND CORONARY ANGIOGRAPHY;  Surgeon: Martinique, Peter M, MD;  [facility]: [facility]  INVASIVE CV LAB;  Service: Cardiovascular;  Laterality: N/A;  . ULTRASOUND GUIDANCE FOR VASCULAR ACCESS  09/16/2017   Procedure: Ultrasound Guidance For Vascular Access;  Surgeon: SwazilandJordan, Peter M, MD;  [facility] IN[facility];  Service: Cardiovascular;;    Family History  Problem Relation Age of Onset  . Breast cancer Mother   . Stroke Father     Allergies  Allergen Reactions  . Ibuprofen Other (See Comments)    Pt has hx of peptic ulcer and diverticulitis.     Current Outpatient Medications on File Prior to Visit  Medication Sig Dispense Refill  . aspirin EC 81 MG tablet Take 1 tablet (81 mg total) by mouth daily. 90 tablet 3  . atorvastatin (LIPITOR) 40 MG tablet Take 1 tablet (40 mg total) by mouth daily at 6 PM. 30 tablet 5  .  cetirizine (ZYRTEC) 10 MG tablet Take 10 mg by mouth as needed for allergies.    . diazepam (VALIUM) 10 MG tablet TAKE 1 TABLET BY MOUTH EVERY 12 HOURS AS NEEDED FOR ANXIETY 45 tablet 0  . dicyclomine (BENTYL) 10 MG capsule Take 1 capsule (10 mg total) by mouth 4 (four) times daily -  before meals and at bedtime. 60 capsule 3  . escitalopram (LEXAPRO) 20 MG tablet Take 1 tablet by mouth once daily 30 tablet 3  . lisinopril (ZESTRIL) 2.5 MG tablet Take 1 tablet by mouth once daily 90 tablet 0  . meclizine (ANTIVERT) 25 MG tablet TAKE 1 TABLET BY MOUTH THREE TIMES DAILY AS NEEDED FOR DIZZINESS 30 tablet 0  . nitroGLYCERIN (NITROSTAT) 0.4 MG SL tablet Place 1 tablet (0.4 mg total) under the tongue every 5 (five) minutes as needed. 11 tablet 6  . nystatin (MYCOSTATIN/NYSTOP) powder Apply topically 4 (four) times daily. 45 g 1  . pantoprazole (PROTONIX) 40 MG tablet Take 1 tablet (40 mg total) by mouth daily. 90 tablet 3  . VENTOLIN HFA 108 (90 Base) MCG/ACT inhaler as needed.     No current facility-administered medications on file prior to visit.     There were no vitals taken for this visit.   Observations/Objective:   Gen: Awake, alert, no acute distress Resp: Breathing is even and non-labored Psych: calm/pleasant demeanor Neuro: Alert and Oriented x 3, + facial symmetry, some delay in word finding at times   Assessment and Plan:  Memory loss- seems mildly confused. Pharmacist/[facility] staff who went to her home found that she was very confused with her medications.  She lives alone but has family near by. I suspect that she has some underlying dementia. We discussed this today and she too reports becoming increasingly forgetful. I recommended referral to neurology for further evaluation and she is agreeable to this.  COPD- reports mild wheezing.  Rx faxed to her pharmacy for nebulizer + tubing as well as albuterol nebs which should only cost $4 at [facility].  She is advised to let me know if  her symptoms worsen or if they fail to improve with the use of albuterol. Encouraged her to mail the patient assistance form as well.  Allergic rhinitis- advised pt to try adding zyrtec 10mg  once daily.  Follow Up Instructions:    I discussed the assessment and treatment plan with the patient. The patient was provided an opportunity to ask questions and all were answered. The patient agreed with the plan and demonstrated an understanding of the instructions.   The patient was advised to call back or seek an in-person  evaluation if the symptoms worsen or if the condition fails to improve as anticipated.  Nance Pear, NP

## 2018-12-13 ENCOUNTER — Other Ambulatory Visit: Payer: Self-pay | Admitting: Pharmacist

## 2018-12-13 NOTE — Patient Outreach (Signed)
[facility] [facility]) Care Management  12/13/2018  Veronica Wolf [birth year] [id number]   Patient was called to follow up on medication assistance forms. HIPAA identifiers were obtained. Patient confirmed receiving the patient assistance forms we sent her. She said she would return them ASAP. [name] Simcox, attempted to contact the patient last week unsuccessfully.  Plan: Patient will be followed by Susy Frizzle for patient assistance. I will follow up with the patient in 4-6 weeks.   Elayne Guerin, PharmD, [place] Clinical Pharmacist [phone]

## 2018-12-18 ENCOUNTER — Other Ambulatory Visit: Payer: Self-pay | Admitting: Pharmacy Technician

## 2018-12-18 NOTE — Patient Outreach (Signed)
[place] [facility]) Care Management  12/18/2018  JARED CAHN 11/12/1940 [id number]   Successful outreach call placed to patient in regards to [employer] application for Proventil HFA.  Spoke to patient, HIPAA identifiers verified.  Patient informed she has not received the Merck application that was mailed to her on 11/23/2018 despite informing [facility] R[facility] Denyse Amass on 12/13/2018 that she had received it.Confirmed address on file is correct.  Will mail patient another application when I am in the office on 12/19/2018 and will followup with patient in 5-7 business days.  Munirah Doerner P. Zunaira Lamy, [place] Management [phone]

## 2018-12-19 ENCOUNTER — Other Ambulatory Visit: Payer: Self-pay | Admitting: Family

## 2018-12-20 NOTE — Telephone Encounter (Signed)
Pt is due for follow up with Melissa on or soon after 01/07/19. Please call pt to scheduled appt. Thanks!

## 2018-12-25 ENCOUNTER — Other Ambulatory Visit: Payer: Self-pay | Admitting: *Deleted

## 2018-12-25 ENCOUNTER — Encounter: Payer: Self-pay | Admitting: *Deleted

## 2018-12-25 NOTE — Patient Outreach (Signed)
[facility]) Care Management  12/25/2018  Veronica Wolf 1941-01-13 [id number]   [place]  Referral Date: 09/20/2017 Referral Source: Hospital Liaison Reason for Referral: Disease Management Education Insurance: [employer]   Outreach Attempt:  Successful telephone outreach to patient for follow up.  HIPAA verified with patient.  Patient reporting she has obtained her nebulizer machine and medication on Saturday.  States she has been using the nebulizer about 3 times a day to help with her wheezing.  Denies any shortness of breath but does endorse some episodes of chills alternating with breaking out in sweats today.  States she does not have a thermometer at home to monitor her temperature.  Also endorses nonproductive cough.  Encouraged patient to contact primary care provider for an appointment as soon as possible.  Reports obtaining flu vaccine this past weekend.  Attempted to review current medications, but patient continues to be confused about getting refills on medications.  Encouraged patient to speak with brother in law concerning assistance with her medication assistance application to return to [place] as soon as possible.  Appointments:  Attended telehealth appointment with primary care provider on 12/08/2018 and has scheduled in person appointment on 01/02/2019.  Encouraged patient to contact provider to request sooner in person appointment with current symptoms.  Plan: RN Health Coach will make another outreach to patient with in the month of October.  [place] [phone] [email or web address]

## 2018-12-27 ENCOUNTER — Other Ambulatory Visit: Payer: Self-pay | Admitting: Pharmacy Technician

## 2018-12-27 NOTE — Patient Outreach (Signed)
Veronica Wolf [facility]) Care Management  12/27/2018  Veronica Wolf 06-06-1940 [id number]    Successful outreach call placed to patient in regards to Merck application for Proventil HFA.  Spoke to patient, HIPAA identifiers verified.  Today was my 3rd overall call to the patient.  Inquired if patient had received the 2nd mailing of the Merck application. Patient informed she was able to pickup an inhaler and does not need any assistance in obtaining another one. Informed patient that we were trying to help her so that she does not have to pay out of pocket for the inhaler: Patient again informed she had secured an inhaler and was not interested in applying for patient assistance.  Will route note to [place] that patient has informed she does not want to participate in patient assistance at this time. Will reopen the case if patient decides to mail in the application. Will remove myself from care team.  Luiz Ochoa. Jeannelle Wiens, [place] Management [phone]

## 2018-12-28 ENCOUNTER — Telehealth: Payer: Self-pay | Admitting: Pharmacist

## 2018-12-28 NOTE — Patient Outreach (Signed)
[facility] [facility]) Care Management  12/28/2018  Veronica Wolf [birth year] [id number]  Patient's pharmacy case is being closed because she has chosen to no longer go through the patient assistance process per her conversation will Susy Frizzle, CPhT on 12/27/2018.  Plan: Send note to [place] still working with the patient.  Elayne Guerin, PharmD, [place] Clinical Pharmacist [phone]

## 2018-12-28 NOTE — Telephone Encounter (Signed)
-----   Message from Jason Fila, CPhT sent at 12/27/2018 12:12 PM EDT ----- fyi Removing myself from care team at this time with option to open back up. Sharee Pimple

## 2019-01-01 ENCOUNTER — Other Ambulatory Visit: Payer: Self-pay

## 2019-01-02 ENCOUNTER — Ambulatory Visit (INDEPENDENT_AMBULATORY_CARE_PROVIDER_SITE_OTHER): Payer: Medicare HMO | Admitting: Family

## 2019-01-02 ENCOUNTER — Telehealth: Payer: Self-pay | Admitting: Family

## 2019-01-02 ENCOUNTER — Other Ambulatory Visit: Payer: Self-pay

## 2019-01-02 DIAGNOSIS — Z20828 Contact with and (suspected) exposure to other viral communicable diseases: Secondary | ICD-10-CM | POA: Diagnosis not present

## 2019-01-02 DIAGNOSIS — Z20822 Contact with and (suspected) exposure to covid-19: Secondary | ICD-10-CM

## 2019-01-02 NOTE — Progress Notes (Signed)
Virtual Visit via Telephone Note  I connected with Veronica Wolf on 01/02/19 at 11:20 AM EDT by telephone and verified that I am speaking with the correct person using two identifiers.  Location: Patient: home Provider: work   I discussed the limitations, risks, security and privacy concerns of performing an evaluation and management service by telephone and the availability of in person appointments. I also discussed with the patient that there may be a patient responsible charge related to this service. The patient expressed understanding and agreed to proceed.   History of Present Illness:  She reports chief complaint of cough.  Reports cough started yesterday.  Reports cough is dry. She denies SOB.  She denies fever or sore throat. Reports + myalgia.  Mild HA. + diarrhea last night.   She reports some mild rhinorrhea.    Reports that her great grandson was diagnosed with covid-19.  He is with her often and is even with her today.    Reports + pain in her right elbow.    Past Medical History:  Diagnosis Date  . Allergic rhinitis   . Anxiety   . CKD (chronic kidney disease) 04/04/2017  . COPD (chronic obstructive pulmonary disease) ([place])   . Depression   . GERD (gastroesophageal reflux disease)   . Hyperlipidemia   . Hypertension   . Peptic ulcer 01/04/2003  . Vertigo      Social History   Socioeconomic History  . Marital status: Widowed    Spouse name: Not on file  . Number of children: Not on file  . Years of education: Not on file  . Highest education level: Not on file  Occupational History  . Not on file  Social Needs  . Financial resource strain: Somewhat hard  . Food insecurity    Worry: Never true    Inability: Never true  . Transportation needs    Medical: No    Non-medical: No  Tobacco Use  . Smoking status: Former Research scientist (life sciences)  . Smokeless tobacco: Never Used  Substance and Sexual Activity  . Alcohol use: No    Comment: has previous hx of ETOH abuse,  quit 2014  . Drug use: No  . Sexual activity: Not on file  Lifestyle  . Physical activity    Days per week: 0 days    Minutes per session: 0 min  . Stress: Rather much  Relationships  . Social Herbalist on phone: Not on file    Gets together: Not on file    Attends religious service: Not on file    Active member of club or organization: Not on file    Attends meetings of clubs or organizations: Not on file    Relationship status: Not on file  . Intimate partner violence    Fear of current or ex partner: Not on file    Emotionally abused: Not on file    Physically abused: Not on file    Forced sexual activity: Not on file  Other Topics Concern  . 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 ABDOMINAL HYSTERECTOMY    . LEFT HEART CATH AND CORONARY ANGIOGRAPHY N/A 06/13/2017   Procedure: LEFT HEART CATH AND CORONARY ANGIOGRAPHY;  Surgeon: Leonie Man, MD;  Location: [place] CV [facility];  Service: Cardiovascular;  Laterality: N/A;  . RIGHT/LEFT HEART CATH AND CORONARY ANGIOGRAPHY N/A 09/16/2017   Procedure: RIGHT/LEFT HEART CATH AND CORONARY ANGIOGRAPHY;  Surgeon: Swaziland, Peter M, MD;  Location: [facility] INVASIVE CV LAB;  Service: Cardiovascular;  Laterality: N/A;  . ULTRASOUND GUIDANCE FOR VASCULAR ACCESS  09/16/2017   Procedure: Ultrasound Guidance For Vascular Access;  Surgeon: Swaziland, Peter M, MD;  Location: [facility] INVASIVE CV LAB;  Service: Cardiovascular;;    Family History  Problem Relation Age of Onset  . Breast cancer Mother   . Stroke Father     Allergies  Allergen Reactions  . Ibuprofen Other (See Comments)    Pt has hx of peptic ulcer and diverticulitis.     Current Outpatient Medications on File Prior to Visit  Medication Sig Dispense Refill  . albuterol (PROVENTIL) (2.5 MG/3ML) 0.083% nebulizer solution Take 3  mLs (2.5 mg total) by nebulization every 6 (six) hours as needed for wheezing or shortness of breath. 150 mL 1  . aspirin EC 81 MG tablet Take 1 tablet (81 mg total) by mouth daily. 90 tablet 3  . atorvastatin (LIPITOR) 40 MG tablet Take 1 tablet (40 mg total) by mouth daily at 6 PM. 30 tablet 5  . cetirizine (ZYRTEC) 10 MG tablet Take 10 mg by mouth as needed for allergies.    . diazepam (VALIUM) 10 MG tablet TAKE 1 TABLET BY MOUTH EVERY 12 HOURS AS NEEDED FOR ANXIETY 45 tablet 0  . dicyclomine (BENTYL) 10 MG capsule Take 1 capsule (10 mg total) by mouth 4 (four) times daily -  before meals and at bedtime. 60 capsule 3  . escitalopram (LEXAPRO) 20 MG tablet Take 1 tablet by mouth once daily 30 tablet 1  . levothyroxine (SYNTHROID) 25 MCG tablet TAKE 1 TABLET BY MOUTH ONCE DAILY BEFORE BREAKFAST 30 tablet 5  . lisinopril (ZESTRIL) 2.5 MG tablet Take 1 tablet by mouth once daily 90 tablet 0  . meclizine (ANTIVERT) 25 MG tablet TAKE 1 TABLET BY MOUTH THREE TIMES DAILY AS NEEDED FOR DIZZINESS 30 tablet 0  . metoprolol succinate (TOPROL-XL) 25 MG 24 hr tablet Take 1/2 (one-half) tablet by mouth once daily 30 tablet 5  . nystatin (MYCOSTATIN/NYSTOP) powder Apply topically 4 (four) times daily. 45 g 1  . pantoprazole (PROTONIX) 40 MG tablet Take 1 tablet (40 mg total) by mouth daily. 90 tablet 3  . potassium chloride SA (K-DUR) 20 MEQ tablet Take 1 tablet (20 mEq total) by mouth daily. 30 tablet 5  . VENTOLIN HFA 108 (90 Base) MCG/ACT inhaler as needed.    . nitroGLYCERIN (NITROSTAT) 0.4 MG SL tablet Place 1 tablet (0.4 mg total) under the tongue every 5 (five) minutes as needed. 11 tablet 6   No current facility-administered medications on file prior to visit.     There were no vitals taken for this visit.   Observations/Objective:   Gen: Awake, alert Resp: Breathing sounds even and non-labored Psych: calm/pleasant demeanor Neuro: Alert and Oriented x 3, speech sounds clear.   Assessment  and Plan:  Elbow pain- recommended that patient try tylenol prn.  If not improvement will need referral to sports medicine.   COVID-19 exposure- Now symptomatic.  I advised the pt to make alternate arrangements for care of her ill great grandson and to self quarantine until further instructions from [place].  Advised her to go to our testing site today for COVID-19 testing. Discussed supportive measures and to go to the ER if she develops shortness of breath.  Follow Up Instructions:    I discussed the assessment and treatment plan with the patient. The patient was provided an opportunity to ask questions and all were answered. The patient agreed with the plan and demonstrated an understanding of the instructions.   The patient was advised to call back or seek an in-person evaluation if the symptoms worsen or if the condition fails to improve as anticipated.  I provided 11 minutes of non-face-to-face time during this encounter.   Lemont FillersMelissa S O'Sullivan, NP

## 2019-01-02 NOTE — Telephone Encounter (Signed)
Called pt to scdh a one week follow up no answer no machine to leave message. Will try to call again

## 2019-01-03 LAB — NOVEL CORONAVIRUS, NAA: SARS-CoV-2, NAA: NOT DETECTED

## 2019-01-03 NOTE — Telephone Encounter (Signed)
Pt has been schedule for 01-09-2019

## 2019-01-04 ENCOUNTER — Telehealth: Payer: Self-pay

## 2019-01-04 NOTE — Telephone Encounter (Signed)
Patient advised, again, all information about her concerns were forwarded to [facility] on another message from today.

## 2019-01-04 NOTE — Telephone Encounter (Signed)
Copied from [place] [id number]. Topic: General - Inquiry >> Jan 04, 2019  1:20 PM Scherrie Gerlach wrote: Reason for CRM: pt would like a call back to further discuss the covid test and recommendations. Pt said last time she called and asked someone to call her, they did not..  Called patient back, she wanted to talk to me again because she does not want to quarantine for 10 days as recommended by Melissa.  She reports she "does not have any symptoms at this time and she did not have symptoms when she talk to [facility] the other day", I reminded her I spoke to her before Lenna Sciara on the 29th and she reported tome and her that she was feeling "terrible with dry cough and congestion".  She reports she made a mistake and her grandson was not diagnosed with covid 60, I asked her if he had a test that was negative and she said "yes", I asked her to provide a copy of that report at that point she said "he was not tested he just had a cold".  I tod patient w/o this documentation we Lenna Sciara will probably not change her mind about the additional quarantine time and to stay home for at least another 10 days to make sure she will be ok. I told her this was for her own safety and to ensure she was not contagious to others.  She will like to here from [place] again tomorrow when Lenna Sciara gets this additional information.

## 2019-01-04 NOTE — Telephone Encounter (Signed)
Copied from [place] [id number]. Topic: Quick Communication - See Telephone Encounter >> Jan 04, 2019  3:04 PM L[place] wrote: CRM for notification. See Telephone encounter for: 01/04/19. Please call [phone] for Rod Holler. Pt is very confused about a negative covid  results and still being quarantined because of grandson. She needs a clearer explanation. Please return the call

## 2019-01-05 NOTE — Telephone Encounter (Signed)
Spoke to patient's daughter Sharee Pimple (on her hippa consent) and let her know we need to know if she was really exposed to Covid 19 like she reported on 01-02-19. I will call her back later for the information.

## 2019-01-05 NOTE — Telephone Encounter (Signed)
Pt callling.  Again, very confused.  States that she never spoke with anyone yesterday and needs to talk to them about COVID results and being quarantined. Called office to see if Rod Holler was available and was told to take a message.  By the time I got back to pt she had hung up.  No contact number given directly from pt.

## 2019-01-05 NOTE — Telephone Encounter (Signed)
Patient advised ok not to quarantine if there was no exposure, but if she was to stay quarantine for another 10 days, she verbalized understanding.

## 2019-01-05 NOTE — Telephone Encounter (Signed)
If she is certain that her great grandson was not diagnosed with COVID-19 then she does not need to quarantine. However if she has had exposure to COVID-19 then she should quarantine for 10 days.

## 2019-01-06 ENCOUNTER — Other Ambulatory Visit: Payer: Self-pay | Admitting: Family

## 2019-01-08 NOTE — Telephone Encounter (Signed)
Attempted to review [place] controlled substance registry. Unable to access website- received error message.

## 2019-01-09 ENCOUNTER — Ambulatory Visit: Payer: Medicare HMO | Admitting: Family

## 2019-01-11 ENCOUNTER — Ambulatory Visit: Payer: Medicare HMO | Admitting: *Deleted

## 2019-01-12 ENCOUNTER — Other Ambulatory Visit: Payer: Self-pay

## 2019-01-12 NOTE — Patient Outreach (Signed)
[facility]) Care Management  01/12/2019  Veronica Wolf 1940-12-09 [id number]   Monthly Telephone Assessment    Outreach attempt #1 to patient. Spoke with patient who voices she is doing well. She denies any acute issues or concerns at present. She reports that her breathing has been doing good. She is using nebs prn as needed. She denies any SOB at present or any recent SOB episodes. Patient shares that she misses being able to get out and does not like being confined to the home due to COVID-19 outbreak. RN CM reviewed with patient safety measures given that she is in the high risk category. Patient voices adherence to safety guidelines. Patient states that she enjoys reading and is hopeful to be able to go back to [employer] soon to get some good books. She reports that her car is in need of repair and her son in law is supposed to fix it this weekend. Patient states that she has not been able to walk outside much due to change in weather. She reports appetite remains decreased. She states that recently four of her front teeth fell out. She has an appt with the dentist on 01/15/2019 to see how she can get teeth replaced. RN CM discussed with patient nutritional supplement usage as she reports about a ten pound weight loss. She has not tried any but voices she will try some. She denies any further RN CM needs or concerns at this time.      Plan: RN CM will make outreach attempt to patient within one month. RN CM will send contact info and [employer] coupons via mail to patient.   Enzo Montgomery, RN,BSN,CCM [place] Management Telephonic Care Management Coordinator Direct Phone: [phone] Toll Free: [phone] Fax: [phone]

## 2019-01-15 ENCOUNTER — Ambulatory Visit: Payer: Self-pay

## 2019-01-17 ENCOUNTER — Ambulatory Visit: Payer: Self-pay | Admitting: Pharmacist

## 2019-01-19 ENCOUNTER — Other Ambulatory Visit: Payer: Self-pay

## 2019-01-19 ENCOUNTER — Ambulatory Visit (INDEPENDENT_AMBULATORY_CARE_PROVIDER_SITE_OTHER): Payer: Medicare HMO | Admitting: Family

## 2019-01-19 ENCOUNTER — Encounter: Payer: Self-pay | Admitting: Family

## 2019-01-19 DIAGNOSIS — R413 Other amnesia: Secondary | ICD-10-CM | POA: Diagnosis not present

## 2019-01-19 DIAGNOSIS — F329 Major depressive disorder, single episode, unspecified: Secondary | ICD-10-CM

## 2019-01-19 DIAGNOSIS — K219 Gastro-esophageal reflux disease without esophagitis: Secondary | ICD-10-CM | POA: Diagnosis not present

## 2019-01-19 DIAGNOSIS — F32A Depression, unspecified: Secondary | ICD-10-CM

## 2019-01-19 DIAGNOSIS — R05 Cough: Secondary | ICD-10-CM

## 2019-01-19 DIAGNOSIS — R059 Cough, unspecified: Secondary | ICD-10-CM

## 2019-01-19 MED ORDER — LISINOPRIL 2.5 MG PO TABS: 2.5000 mg | ORAL_TABLET | Freq: Every day | ORAL | 1 refills | Status: DC

## 2019-01-19 MED ORDER — PANTOPRAZOLE SODIUM 40 MG PO TBEC: 40.0000 mg | DELAYED_RELEASE_TABLET | Freq: Every day | ORAL | 1 refills | Status: DC

## 2019-01-19 MED ORDER — ESCITALOPRAM OXALATE 20 MG PO TABS: 20.0000 mg | ORAL_TABLET | Freq: Every day | ORAL | 1 refills | Status: DC

## 2019-01-19 NOTE — Progress Notes (Signed)
Virtual Visit via Telephone Note  I connected with Veronica Wolf on 01/19/19 at 10:20 AM EDT by telephone and verified that I am speaking with the correct person using two identifiers.  Location: Patient: home Provider: office   I discussed the limitations, risks, security and privacy concerns of performing an evaluation and management service by telephone and the availability of in person appointments. I also discussed with the patient that there may be a patient responsible charge related to this service. The patient expressed understanding and agreed to proceed.   History of Present Illness:  Patient is a 78 yr old female who presents today for follow up.   She reports that she has been using an albuterol inhaler which has been helping her.  Reports that she has an early AM cough, then it resolves. Reports improvement in her nasal congestion.  Memory loss-  Reports that she cancelled her neurology consult because she is going to be having extensive work done on her teeth.  She plans to reschedule at a later date.   Depression- reports that her mood is good.  GERD- reports that she has had some heartburn intermittently.  Past Medical History:  Diagnosis Date  . Allergic rhinitis   . Anxiety   . CKD (chronic kidney disease) 04/04/2017  . COPD (chronic obstructive pulmonary disease) ([place])   . Depression   . GERD (gastroesophageal reflux disease)   . Hyperlipidemia   . Hypertension   . Peptic ulcer 01/04/2003  . Vertigo      Social History   Socioeconomic History  . Marital status: Widowed    Spouse name: Not on file  . Number of children: Not on file  . Years of education: Not on file  . Highest education level: Not on file  Occupational History  . Not on file  Social Needs  . Financial resource strain: Somewhat hard  . Food insecurity    Worry: Never true    Inability: Never true  . Transportation needs    Medical: No    Non-medical: No  Tobacco Use  . Smoking  status: Former Research scientist (life sciences)  . Smokeless tobacco: Never Used  Substance and Sexual Activity  . Alcohol use: No    Comment: has previous hx of ETOH abuse, quit 2014  . Drug use: No  . Sexual activity: Not on file  Lifestyle  . Physical activity    Days per week: 0 days    Minutes per session: 0 min  . Stress: Rather much  Relationships  . Social Herbalist on phone: Not on file    Gets together: Not on file    Attends religious service: Not on file    Active member of club or organization: Not on file    Attends meetings of clubs or organizations: Not on file    Relationship status: Not on file  . Intimate partner violence    Fear of current or ex partner: Not on file    Emotionally abused: Not on file    Physically abused: Not on file    Forced sexual activity: Not on file  Other Topics Concern  . 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 ABDOMINAL HYSTERECTOMY    . LEFT HEART CATH AND CORONARY ANGIOGRAPHY N/A 06/13/2017   Procedure:  LEFT HEART CATH AND CORONARY ANGIOGRAPHY;  Surgeon: Marykay Lex, MD;  Location: [facility] INVASIVE CV LAB;  Service: Cardiovascular;  Laterality: N/A;  . RIGHT/LEFT HEART CATH AND CORONARY ANGIOGRAPHY N/A 09/16/2017   Procedure: RIGHT/LEFT HEART CATH AND CORONARY ANGIOGRAPHY;  Surgeon: Swaziland, Peter M, MD;  Location: [facility] INVASIVE CV LAB;  Service: Cardiovascular;  Laterality: N/A;  . ULTRASOUND GUIDANCE FOR VASCULAR ACCESS  09/16/2017   Procedure: Ultrasound Guidance For Vascular Access;  Surgeon: Swaziland, Peter M, MD;  Location: [facility] INVASIVE CV LAB;  Service: Cardiovascular;;    Family History  Problem Relation Age of Onset  . Breast cancer Mother   . Stroke Father     Allergies  Allergen Reactions  . Ibuprofen Other (See Comments)    Pt has hx of peptic ulcer and diverticulitis.     Current  Outpatient Medications on File Prior to Visit  Medication Sig Dispense Refill  . albuterol (PROVENTIL) (2.5 MG/3ML) 0.083% nebulizer solution Take 3 mLs (2.5 mg total) by nebulization every 6 (six) hours as needed for wheezing or shortness of breath. 150 mL 1  . aspirin EC 81 MG tablet Take 1 tablet (81 mg total) by mouth daily. 90 tablet 3  . atorvastatin (LIPITOR) 40 MG tablet Take 1 tablet (40 mg total) by mouth daily at 6 PM. 30 tablet 5  . cetirizine (ZYRTEC) 10 MG tablet Take 10 mg by mouth as needed for allergies.    . diazepam (VALIUM) 10 MG tablet TAKE 1 TABLET BY MOUTH EVERY 12 HOURS AS NEEDED FOR ANXIETY 45 tablet 0  . dicyclomine (BENTYL) 10 MG capsule Take 1 capsule (10 mg total) by mouth 4 (four) times daily -  before meals and at bedtime. 60 capsule 3  . levothyroxine (SYNTHROID) 25 MCG tablet TAKE 1 TABLET BY MOUTH ONCE DAILY BEFORE BREAKFAST 30 tablet 5  . meclizine (ANTIVERT) 25 MG tablet TAKE 1 TABLET BY MOUTH THREE TIMES DAILY AS NEEDED FOR DIZZINESS 30 tablet 0  . metoprolol succinate (TOPROL-XL) 25 MG 24 hr tablet Take 1/2 (one-half) tablet by mouth once daily 30 tablet 5  . nystatin (MYCOSTATIN/NYSTOP) powder Apply topically 4 (four) times daily. 45 g 1  . potassium chloride SA (K-DUR) 20 MEQ tablet Take 1 tablet (20 mEq total) by mouth daily. 30 tablet 5  . VENTOLIN HFA 108 (90 Base) MCG/ACT inhaler as needed.    . nitroGLYCERIN (NITROSTAT) 0.4 MG SL tablet Place 1 tablet (0.4 mg total) under the tongue every 5 (five) minutes as needed. 11 tablet 6   No current facility-administered medications on file prior to visit.     There were no vitals taken for this visit.     Observations/Objective:    Assessment and Plan:  GERD- fair control. Continue protonix.   Memory loss- encouraged pt to reschedule neurology consult when she is able.  Depression- stable on lexapro, continue same.  Cough- mild, only in the AM. I suspect ACE cough. She reports that it is not  bothersome enough for her to discontinue.  Will monitor.   Follow Up Instructions:    I discussed the assessment and treatment plan with the patient. The patient was provided an opportunity to ask questions and all were answered. The patient agreed with the plan and demonstrated an understanding of the instructions.   The patient was advised to call back or seek an in-person evaluation if the symptoms worsen or if the condition fails to improve as anticipated.  I provided 11 minutes of  non-face-to-face time during this encounter.   Lemont FillersMelissa S O'Sullivan, NP

## 2019-01-25 NOTE — Telephone Encounter (Signed)
Patient's daughter requesting call back from Rod Holler to discuss negative covid-19 result.

## 2019-01-26 NOTE — Telephone Encounter (Signed)
Call patient's daughter Sharee Pimple at [phone], she said she did not need anything at this time

## 2019-01-31 ENCOUNTER — Ambulatory Visit: Payer: Medicare HMO | Admitting: Neurology

## 2019-02-01 ENCOUNTER — Telehealth: Payer: Self-pay

## 2019-02-01 NOTE — Telephone Encounter (Signed)
Copied from [place] [id number]. Topic: General - Inquiry >> Feb 01, 2019  8:31 AM Richardo Priest, NT wrote: Reason for CRM: Patient's dental office called in stating they are needing her COVID-19 results faxed to office before they can see patient and get her dentures. They stated patient suffers from dementia and cannot recall giving consent for office to receive results. Office is asking that nurse or PCP reach out to patient and get the consent then fax results to [phone]. Please advise.

## 2019-02-05 ENCOUNTER — Other Ambulatory Visit: Payer: Self-pay | Admitting: Family

## 2019-02-05 DIAGNOSIS — R42 Dizziness and giddiness: Secondary | ICD-10-CM

## 2019-02-06 NOTE — Telephone Encounter (Signed)
Last diazepam RX: 01/08/19, #45 Last OV: 01/19/19 Next OV: due 07/20/19 UDS: 10/31/17 past due CSC: 03/2017  Past due

## 2019-02-09 ENCOUNTER — Other Ambulatory Visit: Payer: Self-pay

## 2019-02-09 NOTE — Patient Outreach (Addendum)
[facility]) Care Management  02/09/2019  VEVA GRIMLEY Apr 15, 1940 [id number]   Telephone Assessment    Outreach attempt #1 to patient. Spoke with patient who reports she is doing fairly well. She states she has been having some GI/abd issues. She ate some red peppers and rice about two days and since then she has had some abd pain. She states that she does not normally eat red peppers and thinks this upset her stomach. She denies any n&v and diarrhea. She voices that her only symptom is some occasional abd pain. She has been taking Pepto-Bismol prn to help with sxs. She does states that she feels like it is improving RN CM discussed with patient non-pharmacological measures to help aide in recovery. Patient voiced understanding and will try. She had PCP tele health visit a few weeks ago and reports appt went well. She denies any med changes or changes to plan of care.     [facility] CM Care Plan Problem One     Most Recent Value  Care Plan Problem One  Knowledge deficiet related to self management of medications  Role Documenting the Problem One  [place] for Problem One  Active  [facility] Long Term Goal   Patient will review her current medication list versus actual medications with her primary care physician in the next 60 days.  [facility] Long Term Goal Start Date  11/20/18  [facility] Long Term Goal Met Date  02/09/19    [facility] CM Care Plan Problem Two     Most Recent Value  Care Plan Problem Two  -- [Patient with decreased appetite and weight loss.]  Role Documenting the Problem Two  Care Management Telephonic Coordinator  Care Plan for Problem Two  Active  Interventions for Problem Two Long Term Goal   RN CM assessed for dental issues and when follow up appt scheduled.  [facility] Long Term Goal  Patient will have dental work done to aide in increasing oral intake over the next 90 days.  [facility]N Long Term Goal Start Date  01/12/19  [facility] CM Short Term Goal #1   Patient will verbalize  trying nutritional supplements to aide in nutritional intake over the netxt 30 days.  [facility] CM Short Term Goal #1 Start Date  01/12/19  Interventions for Short Term Goal #2   RN CM assessed nutritonal status. RN CM offered diet sugestions to aide in nutritional intake for pt.        Plan: RN CM discussed with patient next outreach within a month. Patient gave verbal consent and in agreement with RN CM follow up timeframe. Patient aware that they may contact RN CM sooner for any issues or concerns. RN CM will send quarterly update to PCP.  Enzo Montgomery, RN,BSN,CCM [place] Management Telephonic Care Management Coordinator Direct Phone: [phone] Toll Free: [phone] Fax: [phone]

## 2019-02-24 ENCOUNTER — Other Ambulatory Visit: Payer: Self-pay | Admitting: Family

## 2019-03-02 ENCOUNTER — Other Ambulatory Visit: Payer: Self-pay | Admitting: Family

## 2019-03-02 ENCOUNTER — Telehealth: Payer: Self-pay | Admitting: Family

## 2019-03-02 NOTE — Telephone Encounter (Signed)
Pt called to get a refill for a powder that she uses for her rashes / Pt is not aware of the name and threw the Rx away/ please advise   Pt has rash all over especially under her breast

## 2019-03-05 ENCOUNTER — Encounter: Payer: Self-pay | Admitting: Family

## 2019-03-05 ENCOUNTER — Ambulatory Visit (INDEPENDENT_AMBULATORY_CARE_PROVIDER_SITE_OTHER): Payer: Medicare HMO | Admitting: Family

## 2019-03-05 DIAGNOSIS — K219 Gastro-esophageal reflux disease without esophagitis: Secondary | ICD-10-CM | POA: Diagnosis not present

## 2019-03-05 DIAGNOSIS — R413 Other amnesia: Secondary | ICD-10-CM | POA: Diagnosis not present

## 2019-03-05 DIAGNOSIS — J45909 Unspecified asthma, uncomplicated: Secondary | ICD-10-CM

## 2019-03-05 DIAGNOSIS — R42 Dizziness and giddiness: Secondary | ICD-10-CM

## 2019-03-05 DIAGNOSIS — L304 Erythema intertrigo: Secondary | ICD-10-CM | POA: Diagnosis not present

## 2019-03-05 DIAGNOSIS — R0789 Other chest pain: Secondary | ICD-10-CM

## 2019-03-05 MED ORDER — FLOVENT HFA 110 MCG/ACT IN AERO: 2.0000 | INHALATION_SPRAY | Freq: Two times a day (BID) | RESPIRATORY_TRACT | 5 refills | Status: DC

## 2019-03-05 MED ORDER — NITROGLYCERIN 0.4 MG SL SUBL: 0.4000 mg | SUBLINGUAL_TABLET | SUBLINGUAL | 5 refills | Status: DC | PRN

## 2019-03-05 MED ORDER — ATORVASTATIN CALCIUM 40 MG PO TABS: 40.0000 mg | ORAL_TABLET | Freq: Every day | ORAL | 5 refills | Status: DC

## 2019-03-05 MED ORDER — MECLIZINE HCL 25 MG PO TABS: ORAL_TABLET | ORAL | 1 refills | Status: DC

## 2019-03-05 NOTE — Telephone Encounter (Signed)
Patient was scheduled for a virtual visit for evaluation and treatment of her skin rash

## 2019-03-05 NOTE — Progress Notes (Signed)
Virtual Visit via Video Note  I connected with Veronica Wolf on 03/05/19 at 10:40 AM EST by a video enabled telemedicine application and verified that I am speaking with the correct person using two identifiers.  Location: Patient: home Provider: home   I discussed the limitations of evaluation and management by telemedicine and the availability of in person appointments. The patient expressed understanding and agreed to proceed.  History of Present Illness:  Reports that she has been waking up with substernal chest pain for a few weeks. She denies pain during the day.  She does reports some heartburn symptoms.  She reports that she is taking pepto bismol. No current chest pain. Has rash beneath both breasts.    Reports + wheezing recently.  Notes that it improves with the use of albuterol but she has been using albuterol more frequently.  Memory loss- she states that her daughter and grandchildren often get frustrated with her forgetting that they told her things.   Past Medical History:  Diagnosis Date  . Allergic rhinitis   . Anxiety   . CKD (chronic kidney disease) 04/04/2017  . COPD (chronic obstructive pulmonary disease) ([facility])   . Depression   . GERD (gastroesophageal reflux disease)   . Hyperlipidemia   . Hypertension   . Peptic ulcer 01/04/2003  . Vertigo      Social History   Socioeconomic History  . Marital status: Widowed    Spouse name: Not on file  . Number of children: Not on file  . Years of education: Not on file  . Highest education level: Not on file  Occupational History  . Not on file  Social Needs  . Financial resource strain: Somewhat hard  . Food insecurity    Worry: Never true    Inability: Never true  . Transportation needs    Medical: No    Non-medical: No  Tobacco Use  . Smoking status: Former Games developermoker  . Smokeless tobacco: Never Used  Substance and Sexual Activity  . Alcohol use: No    Comment: has previous hx of ETOH abuse, quit 2014   . Drug use: No  . Sexual activity: Not on file  Lifestyle  . Physical activity    Days per week: 0 days    Minutes per session: 0 min  . Stress: Rather much  Relationships  . Social Musicianconnections    Talks on phone: Not on file    Gets together: Not on file    Attends religious service: Not on file    Active member of club or organization: Not on file    Attends meetings of clubs or organizations: Not on file    Relationship status: Not on file  . Intimate partner violence    Fear of current or ex partner: Not on file    Emotionally abused: Not on file    Physically abused: Not on file    Forced sexual activity: Not on file  Other Topics Concern  . 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 ABDOMINAL HYSTERECTOMY    . LEFT HEART CATH AND CORONARY ANGIOGRAPHY N/A 06/13/2017   Procedure: LEFT HEART CATH AND CORONARY ANGIOGRAPHY;  Surgeon: Marykay LexHarding, David W, MD;  [facility];  Service: Cardiovascular;  Laterality: N/A;  . RIGHT/LEFT HEART CATH AND  CORONARY ANGIOGRAPHY N/A 09/16/2017   Procedure: RIGHT/LEFT HEART CATH AND CORONARY ANGIOGRAPHY;  Surgeon: Martinique, Peter M, MD;  Location: [place] CV LAB;  Service: Cardiovascular;  Laterality: N/A;  . ULTRASOUND GUIDANCE FOR VASCULAR ACCESS  09/16/2017   Procedure: Ultrasound Guidance For Vascular Access;  Surgeon: Martinique, Peter M, MD;  Location: [place] CV LAB;  Service: Cardiovascular;;    Family History  Problem Relation Age of Onset  . Breast cancer Mother   . Stroke Father     Allergies  Allergen Reactions  . Ibuprofen Other (See Comments)    Pt has hx of peptic ulcer and diverticulitis.     Current Outpatient Medications on File Prior to Visit  Medication Sig Dispense Refill  . albuterol (PROVENTIL) (2.5 MG/3ML) 0.083% nebulizer solution USE 1 VIAL IN  NEBULIZER EVERY 6 HOURS AS NEEDED FOR WHEEZING AND FOR SHORTNESS OF BREATH 150 mL 0  . aspirin EC 81 MG tablet Take 1 tablet (81 mg total) by mouth daily. 90 tablet 3  . cetirizine (ZYRTEC) 10 MG tablet Take 10 mg by mouth as needed for allergies.    . diazepam (VALIUM) 10 MG tablet TAKE 1 TABLET BY MOUTH EVERY 12 HOURS AS NEEDED FOR ANXIETY 45 tablet 0  . dicyclomine (BENTYL) 10 MG capsule Take 1 capsule (10 mg total) by mouth 4 (four) times daily -  before meals and at bedtime. 60 capsule 3  . escitalopram (LEXAPRO) 20 MG tablet Take 1 tablet (20 mg total) by mouth daily. 90 tablet 1  . levothyroxine (SYNTHROID) 25 MCG tablet TAKE 1 TABLET BY MOUTH ONCE DAILY BEFORE BREAKFAST 30 tablet 5  . lisinopril (ZESTRIL) 2.5 MG tablet Take 1 tablet (2.5 mg total) by mouth daily. 90 tablet 1  . metoprolol succinate (TOPROL-XL) 25 MG 24 hr tablet Take 1/2 (one-half) tablet by mouth once daily 30 tablet 5  . NYSTATIN powder APPLY  POWDER TOPICALLY 4 TIMES DAILY 45 g 0  . pantoprazole (PROTONIX) 40 MG tablet Take 1 tablet (40 mg total) by mouth daily. 90 tablet 1  . potassium chloride SA (K-DUR) 20 MEQ tablet Take 1 tablet (20 mEq total) by mouth daily. 30 tablet 5  . VENTOLIN HFA 108 (90 Base) MCG/ACT inhaler as needed.    . [DISCONTINUED] nystatin (MYCOSTATIN/NYSTOP) powder Apply topically 4 (four) times daily. 45 g 1   No current facility-administered medications on file prior to visit.     There were no vitals taken for this visit.   Observations/Objective:   Gen: Awake, alert, no acute distress Resp: Breathing is even and non-labored Psych: calm/pleasant demeanor Neuro: Alert and Oriented x 3, + facial symmetry, speech is clear. Skin: not examined due to difficulty viewing areas with camera  Assessment and Plan:  Intertrigo- refill nystatin powder.  Memory loss- she cancelled her apt with neuro. I would like to send her for neuropsych evaluation. She is agreeable.   Atypical chest pain-  She was previously scheduled to follow up with cardiology, but she cancelled appointment. She had a cardiac cath performed 09/16/17 with the following results:   Prox RCA lesion is 20% stenosed.  Post Atrio lesion is 20% stenosed.  Hemodynamic findings consistent with moderate pulmonary hypertension.  LV end diastolic pressure is moderately elevated.   1. No significant CAD 2. Moderate to severely elevated LV filling pressures 3. Moderate pulmonary venous hypertension.  4. Normal cardiac output.   She states she is out of nitroglycerin and has not tried for her AM  pain.  I sent a refill on her nitro and advised her to try the next time she experiences pain and if it does not resolve her pain she should go to the ED.  I will also bring her into the office for a face to face visit this week and arrange follow up with her cardiologist.    GERD and asthma are also possible contributing factors to her atypical chest pain symptoms.  GERD- maintained on protonix. Continue same.  Asthma- uncontrolled. Will add flovent.     Follow Up Instructions:    I discussed the assessment and treatment plan with the patient. The patient was provided an opportunity to ask questions and all were answered. The patient agreed with the plan and demonstrated an understanding of the instructions.   The patient was advised to call back or seek an in-person evaluation if the symptoms worsen or if the condition fails to improve as anticipated.  Lemont Fillers, NP

## 2019-03-07 ENCOUNTER — Ambulatory Visit: Payer: Medicare HMO | Admitting: Family

## 2019-03-12 ENCOUNTER — Other Ambulatory Visit: Payer: Self-pay

## 2019-03-12 NOTE — Patient Outreach (Signed)
[facility]) Care Management  03/12/2019  Veronica Wolf January 14, 1941 [id number]   Telephone Assessment    Outreach attempt #1 to patient. Spoke briefly with patient. She voiced that she was getting ready to run some errands and requested call back at another time.       Plan: RN CM discussed with patient next outreach within a month. Patient gave verbal consent and in agreement with RN CM follow up and timeframe. Patient aware that they may contact RN CM sooner for any issues or concerns.   Enzo Montgomery, RN,BSN,CCM [place] Management Telephonic Care Management Coordinator Direct Phone: [phone] Toll Free: [phone] Fax: [phone]

## 2019-03-13 ENCOUNTER — Other Ambulatory Visit: Payer: Self-pay

## 2019-03-13 ENCOUNTER — Ambulatory Visit (INDEPENDENT_AMBULATORY_CARE_PROVIDER_SITE_OTHER): Payer: Medicare HMO | Admitting: Medical

## 2019-03-13 ENCOUNTER — Encounter: Payer: Self-pay | Admitting: Medical

## 2019-03-13 DIAGNOSIS — K219 Gastro-esophageal reflux disease without esophagitis: Secondary | ICD-10-CM | POA: Diagnosis not present

## 2019-03-13 DIAGNOSIS — J45909 Unspecified asthma, uncomplicated: Secondary | ICD-10-CM

## 2019-03-13 DIAGNOSIS — R0789 Other chest pain: Secondary | ICD-10-CM | POA: Diagnosis not present

## 2019-03-13 MED ORDER — FAMOTIDINE 20 MG PO TABS: 20.0000 mg | ORAL_TABLET | Freq: Two times a day (BID) | ORAL | 0 refills | Status: DC

## 2019-03-13 NOTE — Patient Instructions (Addendum)
For your atypical chest please follow up with cardiologist office as your pcp recommended and referred you to . Cardiologist office number [phone]. You have nitro tabs available to use if needed. If you have to use and pain does not subside then ED.  For gerd hx, I added famotadine rx and advise continue protinix.  For asthma, advise start flovent. And albuterol neb tx if needed.  Follow up in 2 weeks or as needed

## 2019-03-13 NOTE — Progress Notes (Signed)
Subjective:    Patient ID: Veronica Wolf, female    DOB: 31-Jan-1941, 78 y.o.   MRN: [id number]  HPI   Virtual Visit via Telephone Note  I connected with Veronica Wolf on 03/13/19 at 10:00 AM EST by telephone and verified that I am speaking with the correct person using two identifiers.  Location: Patient: home Provider: office   I discussed the limitations, risks, security and privacy concerns of performing an evaluation and management service by telephone and the availability of in person appointments. I also discussed with the patient that there may be a patient responsible charge related to this service. The patient expressed understanding and agreed to proceed.    Follow Up Instructions:    I discussed the assessment and treatment plan with the patient. The patient was provided an opportunity to ask questions and all were answered. The patient agreed with the plan and demonstrated an understanding of the instructions.   The patient was advised to call back or seek an in-person evaluation if the symptoms worsen or if the condition fails to improve as anticipated.  I provided 25  minutes of non-face-to-face time during this encounter.   Esperanza RichtersEdward Hajra Port, PA-C  Pt seen virtually last time by her pcp. She reported that she has been waking up with substernal chest pain for a few weeks. She denies pain during the day.  She does reports some heartburn symptoms.  She reports that she is taking pepto bismol. No current chest pain. Has rash beneath both breasts.   Pcp made note   Atypical chest pain- She was previously scheduled to follow up with cardiology, but she cancelled appointment. She had a cardiac cath performed 09/16/17 with the following results:   Prox RCA lesion is 20% stenosed.  Post Atrio lesion is 20% stenosed.  Hemodynamic findings consistent with moderate pulmonary hypertension.  LV end diastolic pressure is moderately elevated.  1. No significant CAD 2.  Moderate to severely elevated LV filling pressures 3. Moderate pulmonary venous hypertension.  4. Normal cardiac output.  Pcp gave her refill of nitroglycerin. Advised her to try the next time she experiences pain and if it does not resolve her pain she should go to the ED.  I will also bring her into the office for a face to face visit this week and arrange follow up with her cardiologist.  Referral was placed. Pt states cardiologist did call her. Pt did not accept. Pt acknowledges she nitro on hand to use if needed.  Pt updates states yesterday afternoon she had some faint discomfort yesterda. She had some pain lower portion of sterum/just above stomach. She was belching at the time. She took peptobismal and symptoms resolved quickly. She states last only 2 hours yesterday but non today(at end of 2 hour took pepto). Pt clarifies that yesterday did not have any cardiac type associated signs and symptoms such as jaw pain, arm pain, nauseu, vomiting, or sob.  Pt is on protonix daily.  Pt has asthma. She had flovent added and also has nebulizer). States breathing ok recently. But explains has not filled flovent since was too expensive.    Review of Systems  Constitutional: Negative for chills, fatigue and fever.  Respiratory: Negative for cough, chest tightness, shortness of breath and wheezing.   Cardiovascular: Negative for chest pain and palpitations.       Atypical discomfort.  Gastrointestinal: Positive for abdominal pain. Negative for blood in stool, constipation, diarrhea, nausea and vomiting.  See hpi.  Musculoskeletal: Negative for back pain.  Skin: Negative for rash.  Hematological: Negative for adenopathy. Does not bruise/bleed easily.  Psychiatric/Behavioral: Negative for behavioral problems, confusion, dysphoric mood and sleep disturbance. The patient is not nervous/anxious.     Past Medical History:  Diagnosis Date  . Allergic rhinitis   . Anxiety   . CKD (chronic  kidney disease) 04/04/2017  . COPD (chronic obstructive pulmonary disease) (HCC)   . Depression   . GERD (gastroesophageal reflux disease)   . Hyperlipidemia   . Hypertension   . Peptic ulcer 01/04/2003  . Vertigo      Social History   Socioeconomic History  . Marital status: Widowed    Spouse name: Not on file  . Number of children: Not on file  . Years of education: Not on file  . Highest education level: Not on file  Occupational History  . Not on file  Social Needs  . Financial resource strain: Somewhat hard  . Food insecurity    Worry: Never true    Inability: Never true  . Transportation needs    Medical: No    Non-medical: No  Tobacco Use  . Smoking status: Former Games developer  . Smokeless tobacco: Never Used  Substance and Sexual Activity  . Alcohol use: No    Comment: has previous hx of ETOH abuse, quit 2014  . Drug use: No  . Sexual activity: Not on file  Lifestyle  . Physical activity    Days per week: 0 days    Minutes per session: 0 min  . Stress: Rather much  Relationships  . Social Musician on phone: Not on file    Gets together: Not on file    Attends religious service: Not on file    Active member of club or organization: Not on file    Attends meetings of clubs or organizations: Not on file    Relationship status: Not on file  . Intimate partner violence    Fear of current or ex partner: Not on file    Emotionally abused: Not on file    Physically abused: Not on file    Forced sexual activity: Not on file  Other Topics Concern  . 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 ABDOMINAL HYSTERECTOMY    . LEFT HEART CATH AND CORONARY ANGIOGRAPHY N/A 06/13/2017   Procedure: LEFT HEART CATH AND CORONARY ANGIOGRAPHY;  Surgeon: Marykay Lex, MD;  Location: [facility] INVASIVE CV LAB;  Service:  Cardiovascular;  Laterality: N/A;  . RIGHT/LEFT HEART CATH AND CORONARY ANGIOGRAPHY N/A 09/16/2017   Procedure: RIGHT/LEFT HEART CATH AND CORONARY ANGIOGRAPHY;  Surgeon: Swaziland, Peter M, MD;  Location: [facility] INVASIVE CV LAB;  Service: Cardiovascular;  Laterality: N/A;  . ULTRASOUND GUIDANCE FOR VASCULAR ACCESS  09/16/2017   Procedure: Ultrasound Guidance For Vascular Access;  Surgeon: Swaziland, Peter M, MD;  Location: [facility] INVASIVE CV LAB;  Service: Cardiovascular;;    Family History  Problem Relation Age of Onset  . Breast cancer Mother   . Stroke Father     Allergies  Allergen Reactions  . Ibuprofen Other (See Comments)    Pt has hx of peptic ulcer and diverticulitis.     Current Outpatient Medications on File Prior to Visit  Medication Sig Dispense Refill  .  albuterol (PROVENTIL) (2.5 MG/3ML) 0.083% nebulizer solution USE 1 VIAL IN NEBULIZER EVERY 6 HOURS AS NEEDED FOR WHEEZING AND FOR SHORTNESS OF BREATH 150 mL 0  . aspirin EC 81 MG tablet Take 1 tablet (81 mg total) by mouth daily. 90 tablet 3  . atorvastatin (LIPITOR) 40 MG tablet Take 1 tablet (40 mg total) by mouth daily at 6 PM. 30 tablet 5  . cetirizine (ZYRTEC) 10 MG tablet Take 10 mg by mouth as needed for allergies.    . diazepam (VALIUM) 10 MG tablet TAKE 1 TABLET BY MOUTH EVERY 12 HOURS AS NEEDED FOR ANXIETY 45 tablet 0  . dicyclomine (BENTYL) 10 MG capsule Take 1 capsule (10 mg total) by mouth 4 (four) times daily -  before meals and at bedtime. 60 capsule 3  . escitalopram (LEXAPRO) 20 MG tablet Take 1 tablet (20 mg total) by mouth daily. 90 tablet 1  . fluticasone (FLOVENT HFA) 110 MCG/ACT inhaler Inhale 2 puffs into the lungs 2 (two) times daily. 1 Inhaler 5  . levothyroxine (SYNTHROID) 25 MCG tablet TAKE 1 TABLET BY MOUTH ONCE DAILY BEFORE BREAKFAST 30 tablet 5  . lisinopril (ZESTRIL) 2.5 MG tablet Take 1 tablet (2.5 mg total) by mouth daily. 90 tablet 1  . meclizine (ANTIVERT) 25 MG tablet TAKE 1 TABLET BY MOUTH THREE TIMES  DAILY AS NEEDED FOR DIZZINESS 30 tablet 1  . metoprolol succinate (TOPROL-XL) 25 MG 24 hr tablet Take 1/2 (one-half) tablet by mouth once daily 30 tablet 5  . nitroGLYCERIN (NITROSTAT) 0.4 MG SL tablet Place 1 tablet (0.4 mg total) under the tongue every 5 (five) minutes as needed. 11 tablet 5  . NYSTATIN powder APPLY  POWDER TOPICALLY 4 TIMES DAILY 45 g 0  . pantoprazole (PROTONIX) 40 MG tablet Take 1 tablet (40 mg total) by mouth daily. 90 tablet 1  . potassium chloride SA (K-DUR) 20 MEQ tablet Take 1 tablet (20 mEq total) by mouth daily. 30 tablet 5  . VENTOLIN HFA 108 (90 Base) MCG/ACT inhaler as needed.    . [DISCONTINUED] nystatin (MYCOSTATIN/NYSTOP) powder Apply topically 4 (four) times daily. 45 g 1   No current facility-administered medications on file prior to visit.     There were no vitals taken for this visit.      Objective:   Physical Exam  General- no acute distress, pleasant, alert, oriented. Normal speech.      Assessment & Plan:  For your atypical chest please follow up with cardiologist office as your pcp recommended and referred you to . Cardiologist office number [phone]. You have nitro tabs available to use if needed. If you have to use and pain does not subside then ED.  For gerd hx, I added famotadine rx and advise continue protinix.  For asthma, advise start flovent. And albuterol neb tx if needed.  Follow up in 2 weeks or as needed  [employer], PA-C

## 2019-03-14 ENCOUNTER — Ambulatory Visit: Payer: Medicare HMO | Admitting: Medical

## 2019-03-14 ENCOUNTER — Ambulatory Visit: Payer: Medicare HMO | Admitting: Family

## 2019-03-21 ENCOUNTER — Other Ambulatory Visit: Payer: Self-pay | Admitting: Family

## 2019-04-11 ENCOUNTER — Other Ambulatory Visit: Payer: Self-pay

## 2019-04-11 NOTE — Patient Outreach (Signed)
[facility]) Care Management  04/11/2019  Veronica Wolf 19-Jan-1941 [id number]   Telephone Assessment   Outreach attempt #1 to patient. Spoke briefly with patient who reported she was still asleep and in the bed. She requested call back at another time.     Plan: RN CM will make outreach attempt to patient within a month.   Antionette Fairy, RN,BSN,CCM [facility] Care Management Telephonic Care Management Coordinator Direct Phone: [phone] Toll Free: [phone] Fax: [phone]

## 2019-04-24 ENCOUNTER — Other Ambulatory Visit: Payer: Self-pay | Admitting: Family

## 2019-04-24 DIAGNOSIS — R42 Dizziness and giddiness: Secondary | ICD-10-CM

## 2019-05-06 ENCOUNTER — Other Ambulatory Visit: Payer: Self-pay | Admitting: Family

## 2019-05-07 ENCOUNTER — Other Ambulatory Visit: Payer: Self-pay

## 2019-05-07 NOTE — Patient Outreach (Signed)
[facility]) Care Management  05/07/2019  Veronica Wolf 1941-02-21 [id number]   Telephone Assessment    Outreach attempt to patient. No answer at present and unable to leave message.      Plan: RN CM will make outreach attempt to patient within the month of March. RN CM will send unsuccessful outreach letter to patient.    Antionette Fairy, RN,BSN,CCM [facility] Care Management Telephonic Care Management Coordinator Direct Phone: [phone] Toll Free: [phone] Fax: [phone]

## 2019-05-09 ENCOUNTER — Telehealth: Payer: Self-pay | Admitting: Family

## 2019-05-09 ENCOUNTER — Ambulatory Visit: Payer: Self-pay

## 2019-05-09 NOTE — Telephone Encounter (Signed)
Caller Name: Delanna  Phone: [phone]  Pt asking if ok for her to get the vaccine and Melissa's recommendations.

## 2019-05-11 NOTE — Telephone Encounter (Signed)
Patient wanted to know if Provider can get her an appointment to get covid vaccine.  I advised her we have no influence on the hospital immunization waiting list. Advised her to call and get put on the list.

## 2019-05-14 ENCOUNTER — Ambulatory Visit: Payer: Medicare HMO

## 2019-05-14 ENCOUNTER — Ambulatory Visit: Payer: Medicare HMO | Attending: Internal Medicine

## 2019-05-14 DIAGNOSIS — Z23 Encounter for immunization: Secondary | ICD-10-CM | POA: Insufficient documentation

## 2019-05-14 NOTE — Progress Notes (Signed)
   Covid-19 Vaccination Clinic  Name:  Veronica Wolf    MRN: [id number] DOB: [birth year]  05/14/2019  [name] was observed post Covid-19 immunization for 15 minutes without incidence. She was provided with Vaccine Information Sheet and instruction to access the V-Safe system.   [name] was instructed to call 911 with any severe reactions post vaccine: Marland Kitchen Difficulty breathing  . Swelling of your face and throat  . A fast heartbeat  . A bad rash all over your body  . Dizziness and weakness    Immunizations Administered    Name Date Dose VIS Date Route   Pfizer COVID-19 Vaccine 05/14/2019  5:20 PM 0.3 mL 03/16/2019 Intramuscular   Manufacturer: [employer], [employer]   Lot: [id number]   NDC: [address]-5

## 2019-05-15 ENCOUNTER — Other Ambulatory Visit: Payer: Self-pay | Admitting: Family

## 2019-05-15 DIAGNOSIS — R42 Dizziness and giddiness: Secondary | ICD-10-CM

## 2019-05-17 IMAGING — DX DG CHEST 1V PORT
1 series · 1 of 1 positions shown · non-contrast
Comparison: September 07, 2017

CLINICAL DATA: Shortness of breath and chest pain

EXAM:
PORTABLE CHEST 1 VIEW

[chest ap]
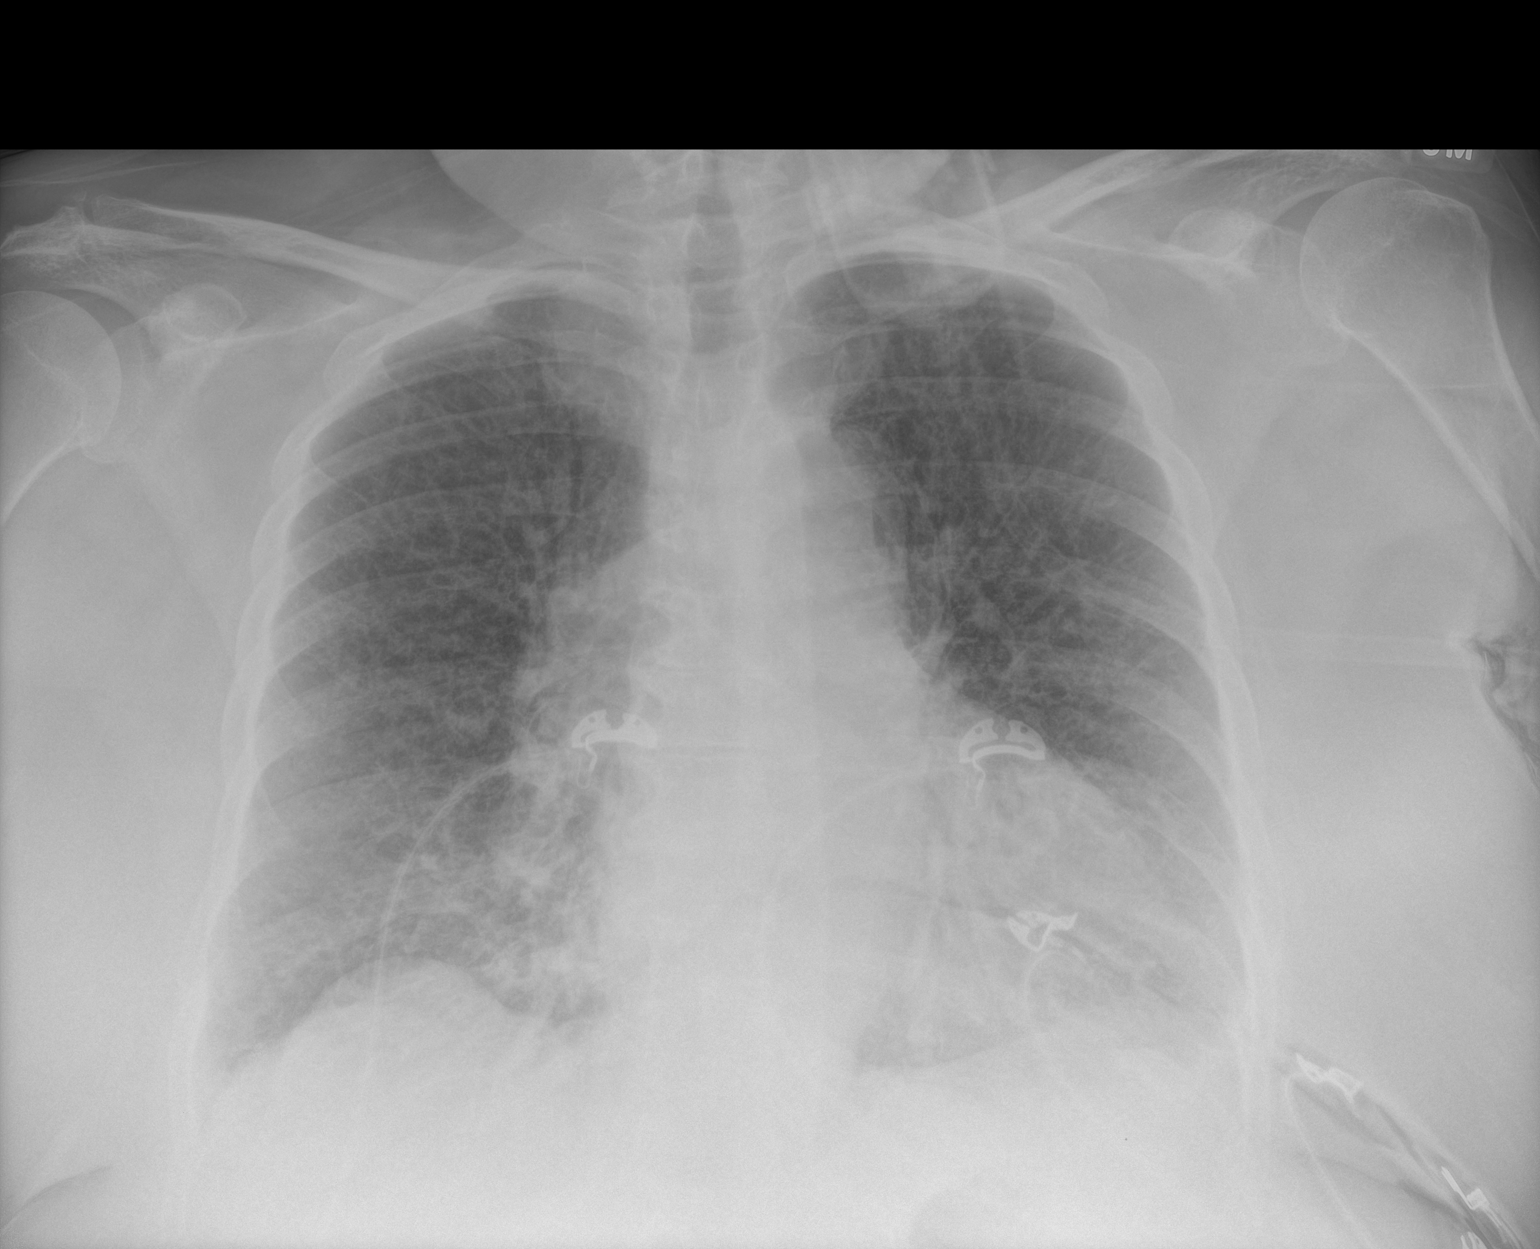

[1 of 1 positions shown; findings below may reference images not displayed]

FINDINGS: Mild cardiomegaly. The hila and mediastinum are normal. Mild
interstitial prominence without overt edema. Mild bibasilar
atelectasis.
IMPRESSION: Cardiomegaly. Pulmonary venous congestion. Probable atelectasis in
the bases.

## 2019-05-24 ENCOUNTER — Other Ambulatory Visit: Payer: Self-pay | Admitting: Family

## 2019-05-28 ENCOUNTER — Telehealth: Payer: Self-pay

## 2019-05-28 MED ORDER — FLUCONAZOLE 150 MG PO TABS: ORAL_TABLET | ORAL | 0 refills | Status: DC

## 2019-05-28 NOTE — Telephone Encounter (Signed)
Rx sent for diflucan to use in addition to diflucan powder. Pt notified.

## 2019-05-28 NOTE — Telephone Encounter (Signed)
Patient called in to see if Dr. Peggyann Juba can send in a stronger prescription because the prescription for NYSTATIN powder [[id number]]    Is not strong enough and the patient is itching so bad between her legs she has scratched to the point of bleeding. Can someone please follow up with the patient as soon as possible at [phone] or call [phone]   Thanks,

## 2019-06-05 ENCOUNTER — Other Ambulatory Visit: Payer: Self-pay

## 2019-06-05 NOTE — Patient Outreach (Signed)
[facility]) Care Management  06/05/2019  Veronica Wolf [birth year] [id number]   Telephone Assessment   Outreach attempt #3 to patient. No answer after multiple rings at both numbers for patient and unable to leave message.     Plan: RN CM will make outreach attempt to patient within two months.     Antionette Fairy, RN,BSN,CCM [facility] Care Management Telephonic Care Management Coordinator Direct Phone: [phone] Toll Free: [phone] Fax: [phone]

## 2019-06-06 ENCOUNTER — Ambulatory Visit: Payer: Self-pay

## 2019-06-08 ENCOUNTER — Ambulatory Visit: Payer: Medicare HMO | Attending: Internal Medicine

## 2019-06-08 DIAGNOSIS — Z23 Encounter for immunization: Secondary | ICD-10-CM

## 2019-06-08 NOTE — Telephone Encounter (Signed)
Pt states her rash is spreading from stomach to legs and she's still itching. Please advise what patient can be prescribed to take.

## 2019-06-08 NOTE — Progress Notes (Signed)
   Covid-19 Vaccination Clinic  Name:  DARLISHA KELM    MRN: [id number] DOB: 1940/12/03  06/08/2019  Ms. Mensinger was observed post Covid-19 immunization for 15 minutes without incident. She was provided with Vaccine Information Sheet and instruction to access the V-Safe system.   Ms. Larsen was instructed to call 911 with any severe reactions post vaccine: Marland Kitchen Difficulty breathing  . Swelling of face and throat  . A fast heartbeat  . A bad rash all over body  . Dizziness and weakness   Immunizations Administered    Name Date Dose VIS Date Route   Pfizer COVID-19 Vaccine 06/08/2019  5:08 PM 0.3 mL 03/16/2019 Intramuscular   Manufacturer: [employer], [employer]   Lot: [id number]   NDC: [address]-0

## 2019-06-09 MED ORDER — BETAMETHASONE DIPROPIONATE 0.05 % EX CREA: TOPICAL_CREAM | Freq: Two times a day (BID) | CUTANEOUS | 0 refills | Status: DC

## 2019-06-09 NOTE — Addendum Note (Signed)
Addended by: [name] on: 06/09/2019 09:30 AM   Modules accepted: Orders

## 2019-06-09 NOTE — Telephone Encounter (Signed)
Spoke to patient. She reports feeling some better today.  I advised her that I will send a cream to her pharmacy and that we would check back with her on Monday. If needed we will plan a video visit on Monday.   Windell Moulding, can you please call pt on Monday AM to check on her and offer video visit if needed?

## 2019-06-11 NOTE — Telephone Encounter (Signed)
Patient scheduled for video visit tomorrow evening as she was "not available for visit this morning"

## 2019-06-12 ENCOUNTER — Other Ambulatory Visit: Payer: Self-pay

## 2019-06-12 ENCOUNTER — Ambulatory Visit: Payer: Medicare HMO | Admitting: Family

## 2019-06-13 ENCOUNTER — Other Ambulatory Visit: Payer: Self-pay

## 2019-06-13 ENCOUNTER — Ambulatory Visit (INDEPENDENT_AMBULATORY_CARE_PROVIDER_SITE_OTHER): Payer: Medicare HMO | Admitting: Family

## 2019-06-13 ENCOUNTER — Encounter: Payer: Self-pay | Admitting: Family

## 2019-06-13 DIAGNOSIS — R21 Rash and other nonspecific skin eruption: Secondary | ICD-10-CM

## 2019-06-13 DIAGNOSIS — J449 Chronic obstructive pulmonary disease, unspecified: Secondary | ICD-10-CM | POA: Diagnosis not present

## 2019-06-13 NOTE — Progress Notes (Signed)
Virtual Visit via Video Note  I connected with Veronica Wolf on 06/13/19 at 12:40 PM EST by a video enabled telemedicine application and verified that I am speaking with the correct person using two identifiers.  Location: Patient: home Provider: office   I discussed the limitations of evaluation and management by telemedicine and the availability of in person appointments. The patient expressed understanding and agreed to proceed.  History of Present Illness:  Patient is a 79 year old female who presents today to discuss rash.  She reports that she has had itching on her legs and torso.  Yesterday noted some redness in the area.  We sent a prescription for betamethasone to her pharmacy.  She was unable to afford this.  She reports that there is no obvious rash today.  She is not having significant itching at this time. Wants to pick up some calamine lotion.  Reports that nothing has changed with her soaps or detergents.    Past Medical History:  Diagnosis Date  . Allergic rhinitis   . Anxiety   . CKD (chronic kidney disease) 04/04/2017  . COPD (chronic obstructive pulmonary disease) (HCC)   . Depression   . GERD (gastroesophageal reflux disease)   . Hyperlipidemia   . Hypertension   . Peptic ulcer 01/04/2003  . Vertigo      Social History   Socioeconomic History  . Marital status: Widowed    Spouse name: Not on file  . Number of children: Not on file  . Years of education: Not on file  . Highest education level: Not on file  Occupational History  . Not on file  Tobacco Use  . Smoking status: Former Games developer  . Smokeless tobacco: Never Used  Substance and Sexual Activity  . Alcohol use: No    Comment: has previous hx of ETOH abuse, quit 2014  . Drug use: No  . Sexual activity: Not on file  Other Topics Concern  . 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 Difficulty of Paying Living Expenses: Somewhat hard  Food Insecurity: No Food Insecurity  . Worried About Programme researcher, broadcasting/film/video in the Last Year: Never true  . Ran Out of Food in the Last Year: Never true  Transportation Needs: No Transportation Needs  . Lack of Transportation (Medical): No  . Lack of Transportation (Non-Medical): No  Physical Activity: Inactive  . Days of Exercise per Week: 0 days  . Minutes of Exercise per Session: 0 min  Stress: Stress Concern Present  . Feeling of Stress : Rather much  Social Connections:   . Frequency of Communication with Friends and Family: Not on file  . Frequency of Social Gatherings with Friends and Family: Not on file  . Attends Religious Services: Not on file  . Active Member of Clubs or Organizations: Not on file  . Attends Banker Meetings: Not on file  . Marital Status: Not on file  Intimate Partner Violence:   . Fear of Current or Ex-Partner: Not on file  . Emotionally Abused: Not on file  . Physically Abused: Not on file  . Sexually Abused: Not on file    Past Surgical History:  Procedure Laterality Date  . ABDOMINAL HYSTERECTOMY    . LEFT HEART CATH AND CORONARY ANGIOGRAPHY N/A 06/13/2017  Procedure: LEFT HEART CATH AND CORONARY ANGIOGRAPHY;  Surgeon: Leonie Man, MD;  Location: [place] CV LAB;  Service: Cardiovascular;  Laterality: N/A;  . RIGHT/LEFT HEART CATH AND CORONARY ANGIOGRAPHY N/A 09/16/2017   Procedure: RIGHT/LEFT HEART CATH AND CORONARY ANGIOGRAPHY;  Surgeon: Martinique, Peter M, MD;  Location: [place] CV LAB;  Service: Cardiovascular;  Laterality: N/A;  . ULTRASOUND GUIDANCE FOR VASCULAR ACCESS  09/16/2017   Procedure: Ultrasound Guidance For Vascular Access;  Surgeon: Martinique, Peter M, MD;  Location: [place] CV LAB;  Service: Cardiovascular;;    Family History  Problem Relation Age of Onset  . Breast cancer Mother   . Stroke Father      Allergies  Allergen Reactions  . Ibuprofen Other (See Comments)    Pt has hx of peptic ulcer and diverticulitis.     Current Outpatient Medications on File Prior to Visit  Medication Sig Dispense Refill  . albuterol (PROVENTIL) (2.5 MG/3ML) 0.083% nebulizer solution USE 1 VIAL IN NEBULIZER EVERY 6 HOURS AS NEEDED FOR WHEEZING AND FOR SHORTNESS OF BREATH 150 mL 0  . aspirin EC 81 MG tablet Take 1 tablet (81 mg total) by mouth daily. 90 tablet 3  . atorvastatin (LIPITOR) 40 MG tablet Take 1 tablet (40 mg total) by mouth daily at 6 PM. 30 tablet 5  . betamethasone dipropionate 0.05 % cream Apply topically 2 (two) times daily. 30 g 0  . cetirizine (ZYRTEC) 10 MG tablet Take 10 mg by mouth as needed for allergies.    . diazepam (VALIUM) 10 MG tablet TAKE 1 TABLET BY MOUTH EVERY 12 HOURS AS NEEDED FOR ANXIETY 45 tablet 0  . dicyclomine (BENTYL) 10 MG capsule TAKE 1 CAPSULE BY MOUTH 4 TIMES DAILY BEFORE MEAL(S) AND AT BEDTIME 60 capsule 0  . escitalopram (LEXAPRO) 20 MG tablet Take 1 tablet (20 mg total) by mouth daily. 90 tablet 1  . famotidine (PEPCID) 20 MG tablet Take 1 tablet (20 mg total) by mouth 2 (two) times daily. 60 tablet 0  . fluconazole (DIFLUCAN) 150 MG tablet Take 1 tablet by mouth today and repeat in 1 week. 2 tablet 0  . fluticasone (FLOVENT HFA) 110 MCG/ACT inhaler Inhale 2 puffs into the lungs 2 (two) times daily. 1 Inhaler 5  . levothyroxine (SYNTHROID) 25 MCG tablet TAKE 1 TABLET BY MOUTH ONCE DAILY BEFORE BREAKFAST 30 tablet 5  . lisinopril (ZESTRIL) 2.5 MG tablet Take 1 tablet (2.5 mg total) by mouth daily. 90 tablet 1  . meclizine (ANTIVERT) 25 MG tablet TAKE 1 TABLET BY MOUTH THREE TIMES DAILY AS NEEDED FOR DIZZINESS 30 tablet 0  . metoprolol succinate (TOPROL-XL) 25 MG 24 hr tablet Take 1/2 (one-half) tablet by mouth once daily 30 tablet 5  . NYSTATIN powder APPLY  POWDER TOPICALLY TO AFFECTED AREA 4 TIMES DAILY 45 g 0  . pantoprazole (PROTONIX) 40 MG tablet Take 1  tablet (40 mg total) by mouth daily. 90 tablet 1  . potassium chloride SA (K-DUR) 20 MEQ tablet Take 1 tablet (20 mEq total) by mouth daily. 30 tablet 5  . VENTOLIN HFA 108 (90 Base) MCG/ACT inhaler as needed.    . nitroGLYCERIN (NITROSTAT) 0.4 MG SL tablet Place 1 tablet (0.4 mg total) under the tongue every 5 (five) minutes as needed. 11 tablet 5  . [DISCONTINUED] nystatin (MYCOSTATIN/NYSTOP) powder Apply topically 4 (four) times daily. 45 g 1   No current facility-administered medications on file prior to visit.    There were no  vitals taken for this visit.   Observations/Objective:   Gen: Awake, alert, no acute distress Resp: Breathing is even and non-labored Psych: calm/pleasant demeanor Neuro: Alert and Oriented x 3, + facial symmetry, speech is clear. Skin: Patient was unable to position camera for me to be able to see her legs or abdomen.  However she reports no obvious rash today.  Assessment and Plan:  Skin rash-this is clinically improved.  I have advised the patient is okay to use as needed calamine lotion and to purchase over-the-counter hydrocortisone to use as needed.  In addition we discussed switching her detergent and fabric softener to a free and clear version for sensitive skin.  We also discussed using a gentle soap such as Dove for sensitive skin.  She is advised to call symptoms worsen.  Patient verbalizes understanding Follow Up Instructions:    I discussed the assessment and treatment plan with the patient. The patient was provided an opportunity to ask questions and all were answered. The patient agreed with the plan and demonstrated an understanding of the instructions.   The patient was advised to call back or seek an in-person evaluation if the symptoms worsen or if the condition fails to improve as anticipated.  Lemont Fillers, NP

## 2019-06-24 ENCOUNTER — Other Ambulatory Visit: Payer: Self-pay | Admitting: Family

## 2019-06-24 DIAGNOSIS — R42 Dizziness and giddiness: Secondary | ICD-10-CM

## 2019-06-26 ENCOUNTER — Other Ambulatory Visit: Payer: Self-pay | Admitting: Family

## 2019-06-26 DIAGNOSIS — F419 Anxiety disorder, unspecified: Secondary | ICD-10-CM

## 2019-06-26 NOTE — Telephone Encounter (Signed)
Requesting:valium  Contract:no UDS:10/31/17 Last OV:06/13/19 Next OV:n/a Last Refill:05/25/19  #[id number] Database:   Please advise

## 2019-06-27 NOTE — Telephone Encounter (Signed)
Please contact pt and let her know that I have sent refill. She is due to update UDS and contract. She will need to schedule lab appointment to complete this please.

## 2019-06-28 NOTE — Telephone Encounter (Signed)
Patient advised of rx and scheduled to be here tomorrow for uds and to sign contract.

## 2019-07-02 ENCOUNTER — Other Ambulatory Visit: Payer: Medicare HMO

## 2019-07-03 ENCOUNTER — Other Ambulatory Visit: Payer: Self-pay

## 2019-07-03 ENCOUNTER — Other Ambulatory Visit (INDEPENDENT_AMBULATORY_CARE_PROVIDER_SITE_OTHER): Payer: Medicare HMO

## 2019-07-03 DIAGNOSIS — F419 Anxiety disorder, unspecified: Secondary | ICD-10-CM | POA: Diagnosis not present

## 2019-07-03 DIAGNOSIS — Z79899 Other long term (current) drug therapy: Secondary | ICD-10-CM

## 2019-07-03 NOTE — Progress Notes (Signed)
Pt unable to obtain urine for UDS. Changed order to oral swab.

## 2019-07-03 NOTE — Addendum Note (Signed)
Addended by: Mervin Kung A on: 07/03/2019 08:13 AM   Modules accepted: Orders

## 2019-07-07 LAB — DRUG TOX MONITOR 1 W/CONF, ORAL FLD
Alprazolam: NEGATIVE ng/mL (ref <0.50)
Amphetamines: NEGATIVE ng/mL (ref <10)
Barbiturates: NEGATIVE ng/mL (ref <10)
Benzodiazepines: POSITIVE ng/mL — AB (ref <0.50)
Buprenorphine: NEGATIVE ng/mL (ref <0.10)
Chlordiazepoxide: NEGATIVE ng/mL (ref <0.50)
Clonazepam: NEGATIVE ng/mL (ref <0.50)
Cocaine: NEGATIVE ng/mL (ref <5.0)
Diazepam: 2.98 ng/mL — ABNORMAL HIGH (ref <0.50)
Fentanyl: NEGATIVE ng/mL (ref <0.10)
Flunitrazepam: NEGATIVE ng/mL (ref <0.50)
Flurazepam: NEGATIVE ng/mL (ref <0.50)
Heroin Metabolite: NEGATIVE ng/mL (ref <1.0)
Lorazepam: NEGATIVE ng/mL (ref <0.50)
MARIJUANA: NEGATIVE ng/mL (ref <2.5)
MDMA: NEGATIVE ng/mL (ref <10)
Meprobamate: NEGATIVE ng/mL (ref <2.5)
Methadone: NEGATIVE ng/mL (ref <5.0)
Midazolam: NEGATIVE ng/mL (ref <0.50)
Nicotine Metabolite: NEGATIVE ng/mL (ref <5.0)
Nordiazepam: 9.46 ng/mL — ABNORMAL HIGH (ref <0.50)
Opiates: NEGATIVE ng/mL (ref <2.5)
Oxazepam: NEGATIVE ng/mL (ref <0.50)
Phencyclidine: NEGATIVE ng/mL (ref <10)
Tapentadol: NEGATIVE ng/mL (ref <5.0)
Temazepam: NEGATIVE ng/mL (ref <0.50)
Tramadol: NEGATIVE ng/mL (ref <5.0)
Triazolam: NEGATIVE ng/mL (ref <0.50)
Zolpidem: NEGATIVE ng/mL (ref <5.0)

## 2019-08-02 ENCOUNTER — Other Ambulatory Visit: Payer: Self-pay | Admitting: Family

## 2019-08-02 DIAGNOSIS — R42 Dizziness and giddiness: Secondary | ICD-10-CM

## 2019-08-02 NOTE — Telephone Encounter (Signed)
Requesting: valium Contract:07/09/19 UDS:07/03/19 Last Visit:06/13/19 Next Visit:n/a Last Refill:06/27/19  Please Advise

## 2019-08-07 ENCOUNTER — Other Ambulatory Visit: Payer: Self-pay

## 2019-08-07 MED ORDER — NYSTATIN 100000 UNIT/GM EX POWD: CUTANEOUS | 1 refills | Status: DC

## 2019-08-07 NOTE — Telephone Encounter (Signed)
Rx sent and pt has been notified. 
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
## 2019-08-07 NOTE — Telephone Encounter (Signed)
Patient called in to get a refill for NYSTATIN powder [[id number]]    Per the patient she needs the generic brand.   Please send it to [facility] [address] [place], [place] - [address]  [address], [place] [address]  Phone:  [phone] Fax:  [phone]  DEA #:  --

## 2019-08-10 ENCOUNTER — Other Ambulatory Visit: Payer: Self-pay | Admitting: Family

## 2019-08-10 MED ORDER — DIAZEPAM 10 MG PO TABS: 10.0000 mg | ORAL_TABLET | Freq: Two times a day (BID) | ORAL | 0 refills | Status: DC | PRN

## 2019-08-15 ENCOUNTER — Other Ambulatory Visit: Payer: Self-pay | Admitting: Family

## 2019-08-15 DIAGNOSIS — R42 Dizziness and giddiness: Secondary | ICD-10-CM

## 2019-08-17 ENCOUNTER — Telehealth: Payer: Self-pay | Admitting: Family

## 2019-08-17 NOTE — Telephone Encounter (Signed)
Patient is requesting for her meds to be sent to a mail in pharamcy due to her having lack of transportation... Patient states she's going to also call her insurance as well. Please advise

## 2019-08-17 NOTE — Telephone Encounter (Signed)
Patient advised to let us know which mail order supplier she will be using.

## 2019-08-23 ENCOUNTER — Other Ambulatory Visit: Payer: Self-pay | Admitting: Family

## 2019-08-31 ENCOUNTER — Telehealth (INDEPENDENT_AMBULATORY_CARE_PROVIDER_SITE_OTHER): Payer: Medicare HMO | Admitting: Family

## 2019-08-31 ENCOUNTER — Other Ambulatory Visit: Payer: Self-pay

## 2019-08-31 DIAGNOSIS — R197 Diarrhea, unspecified: Secondary | ICD-10-CM | POA: Diagnosis not present

## 2019-08-31 DIAGNOSIS — J40 Bronchitis, not specified as acute or chronic: Secondary | ICD-10-CM | POA: Diagnosis not present

## 2019-08-31 MED ORDER — AZITHROMYCIN 250 MG PO TABS
ORAL_TABLET | ORAL | 0 refills | Status: DC
Start: 2019-08-31 — End: 2019-10-17

## 2019-08-31 MED ORDER — BENZONATATE 100 MG PO CAPS
100.0000 mg | ORAL_CAPSULE | Freq: Three times a day (TID) | ORAL | 0 refills | Status: DC | PRN
Start: 2019-08-31 — End: 2019-09-04

## 2019-08-31 NOTE — Progress Notes (Signed)
Virtual Visit via Video Note  I connected with Veronica Wolf on 08/31/19 at  2:00 PM EDT by a video enabled telemedicine application and verified that I am speaking with the correct person using two identifiers.  Location: Patient: home Provider: work   I discussed the limitations of evaluation and management by telemedicine and the availability of in person appointments. The patient expressed understanding and agreed to proceed.  History of Present Illness:  Patient is an 79 yr old female who presents today with chief complaint of cough. She reports that her cough has been present for about 2-1/2 weeks. She has been using TheraFlu without much improvement. She does report that the cough is a bit better today. She reports mild headache which she attributes to cough. She is using her humidifier "constantly."  She also reports diarrhea for the last 2 days. This is accompanied by decreased appetite. She denies any recent antibiotic use.  Observations/Objective:   Gen: Awake, alert, no acute distress Resp: Breathing is even and non-labored Psych: calm/pleasant demeanor Neuro: Alert and Oriented x 3, + facial symmetry, speech is clear.   Assessment and Plan:  Bronchitis-will treat with a azithromycin and as needed Tessalon. She is advised to call if symptoms worsen or if symptoms fail to improve.  Diarrhea-I suspect this is not related to her cough. Could be a viral illness. Unlikely C. difficile given no recent use of antibiotics. I have advised her to ensure adequate hydration and to use Imodium as needed. She is advised to call if symptoms worsen or if symptoms are not improved in the next 3 days. She is advised to go to the ED should she develop severe or worsening symptoms including weakness.   Follow Up Instructions:    I discussed the assessment and treatment plan with the patient. The patient was provided an opportunity to ask questions and all were answered. The patient agreed  with the plan and demonstrated an understanding of the instructions.   The patient was advised to call back or seek an in-person evaluation if the symptoms worsen or if the condition fails to improve as anticipated.  Lemont Fillers, NP

## 2019-09-03 ENCOUNTER — Other Ambulatory Visit: Payer: Self-pay | Admitting: Family

## 2019-09-04 ENCOUNTER — Telehealth: Payer: Self-pay | Admitting: Family

## 2019-09-04 NOTE — Telephone Encounter (Signed)
Caller: [place] Call back phone number:[phone]  Patient states she would like a different cough medicine  [facility] [address] [place], [place] - [address]  [address], [place] [address]  Phone:  [phone] Fax:  [phone]

## 2019-09-08 ENCOUNTER — Other Ambulatory Visit: Payer: Self-pay | Admitting: Family

## 2019-09-08 DIAGNOSIS — R42 Dizziness and giddiness: Secondary | ICD-10-CM

## 2019-09-11 ENCOUNTER — Other Ambulatory Visit: Payer: Self-pay | Admitting: Family

## 2019-09-12 NOTE — Telephone Encounter (Signed)
Called patient several times in the last few days with no answer and she has not returned phone call.

## 2019-09-13 ENCOUNTER — Other Ambulatory Visit: Payer: Self-pay | Admitting: Family

## 2019-09-17 ENCOUNTER — Telehealth: Payer: Self-pay

## 2019-09-25 ENCOUNTER — Other Ambulatory Visit: Payer: Self-pay | Admitting: Family

## 2019-10-03 ENCOUNTER — Other Ambulatory Visit: Payer: Self-pay | Admitting: Family

## 2019-10-04 ENCOUNTER — Telehealth: Payer: Self-pay | Admitting: Family

## 2019-10-04 NOTE — Telephone Encounter (Signed)
Please let pt know that I sent her valium refill. I see her dentist gave her an rx with hydrocodone. I would recommend that she avoid taking the pain medicine with the valium as the combo can be dangerous.

## 2019-10-05 NOTE — Telephone Encounter (Signed)
Patient advised rx was sent and advised not to take with the hydrocodone, she verbalized understanding.

## 2019-10-09 ENCOUNTER — Other Ambulatory Visit: Payer: Self-pay

## 2019-10-09 ENCOUNTER — Other Ambulatory Visit: Payer: Self-pay | Admitting: Family

## 2019-10-09 DIAGNOSIS — R42 Dizziness and giddiness: Secondary | ICD-10-CM

## 2019-10-09 MED ORDER — ATORVASTATIN CALCIUM 40 MG PO TABS: 40.0000 mg | ORAL_TABLET | Freq: Every day | ORAL | 5 refills | Status: DC

## 2019-10-09 NOTE — Patient Outreach (Signed)
[facility]) Care Management  10/09/2019  Veronica Wolf 07-16-1940 [id number]   Telephone Assessment    Outreach attempt to patient. Spoke with patient who denies any acute issues or concerns at present. She voices that she had been dealing with some gout flare up. She took meds as ordered and voices med has provided relief. Patient denies any falls. She is ambulatory without assistance and voices she is "extra careful" when ambulating. She continues to live with supportive family who assist her as needed. She is unsure of next PCP appt. Appetite remains fair. She voices she had noticed a decrease in appetite and how much is consuming. She does not have scale in home to monitor wgt. She denies any SOB or breathing problems lately. She denies any RN CM needs or concerns at present.   Medications Reviewed Today    Reviewed by Hayden Pedro, RN (Registered Nurse) on 10/09/19 at 68  Med List Status: <None>  Medication Order Taking? Sig Documenting Provider Last Dose Status Informant  albuterol (PROVENTIL) (2.5 MG/3ML) 0.083% nebulizer solution [id number] No USE 1 VIAL IN NEBULIZER EVERY 6 HOURS AS NEEDED FOR WHEEZING AND FOR SHORTNESS OF [name], Lenna Sciara, NP Taking Active   allopurinol (ZYLOPRIM) 100 MG tablet [id number]  Take 2 tablets by mouth once daily Debbrah Alar, NP  Active   aspirin EC 81 MG tablet [id number] No Take 1 tablet (81 mg total) by mouth daily. Revankar, Reita Cliche, MD Taking Active Self  atorvastatin (LIPITOR) 40 MG tablet [id number] No Take 1 tablet (40 mg total) by mouth daily at 6 PM. Debbrah Alar, NP Taking Active   azithromycin (ZITHROMAX) 250 MG tablet [id number]  Take 2 tabs by mouth today, then one tab once daily for 4 more days Debbrah Alar, NP  Active   benzonatate (TESSALON) 100 MG capsule [id number]  Take 1 capsule by mouth three times daily as needed Debbrah Alar, NP  Active   betamethasone dipropionate  0.05 % cream [id number] No Apply topically 2 (two) times daily. Debbrah Alar, NP Taking Active   cetirizine (ZYRTEC) 10 MG tablet [id number] No Take 10 mg by mouth as needed for allergies. [provider] Taking Active Self  diazepam (VALIUM) 10 MG tablet [id number]  TAKE 1 TABLET BY MOUTH EVERY 12 HOURS AS NEEDED FOR ANXIETY Debbrah Alar, NP  Active   dicyclomine (BENTYL) 10 MG capsule [id number]  TAKE 1 CAPSULE BY MOUTH THREE TIMES DAILY BEFORE MEAL(S) AND TAKE 1 CAPSULE AT BEDTIME Debbrah Alar, NP  Active   escitalopram (LEXAPRO) 20 MG tablet [id number] No Take 1 tablet (20 mg total) by mouth daily. Debbrah Alar, NP Taking Active   famotidine (PEPCID) 20 MG tablet [id number] No Take 1 tablet (20 mg total) by mouth 2 (two) times daily. Saguier, Percell Miller, PA-C Taking Active   fluconazole (DIFLUCAN) 150 MG tablet [id number] No Take 1 tablet by mouth today and repeat in 1 week. Debbrah Alar, NP Taking Active   fluticasone [facility] HFA) 110 MCG/ACT inhaler [id number] No Inhale 2 puffs into the lungs 2 (two) times daily. Debbrah Alar, NP Taking Active   levothyroxine (SYNTHROID) 25 MCG tablet [id number] No TAKE 1 TABLET BY MOUTH ONCE DAILY BEFORE Adaline Sill, NP Taking Active   lisinopril (ZESTRIL) 2.5 MG tablet [id number] No Take 1 tablet by mouth once daily Debbrah Alar, NP Taking Active   meclizine (ANTIVERT) 25 MG tablet [id number]  TAKE 1 TABLET BY MOUTH THREE TIMES DAILY AS NEEDED FOR DIZZINESS Debbrah Alar, NP  Active   metoprolol succinate (TOPROL-XL) 25 MG 24 hr tablet [id number] No Take 1/2 (one-half) tablet by mouth once daily Debbrah Alar, NP Taking Active   nitroGLYCERIN (NITROSTAT) 0.4 MG SL tablet [id number] No Place 1 tablet (0.4 mg total) under the tongue every 5 (five) minutes as needed. Debbrah Alar, NP Taking Expired 06/03/19 2359         Discontinued 03/04/19 1214 (Reorder)   nystatin (NYSTATIN) powder  [id number]  APPLY  POWDER TOPICALLY TO AFFECTED AREA 4 TIMES DAILY Debbrah Alar, NP  Active   pantoprazole (PROTONIX) 40 MG tablet [id number] No Take 1 tablet by mouth once daily Debbrah Alar, NP Taking Active   potassium chloride SA (K-DUR) 20 MEQ tablet [id number] No Take 1 tablet (20 mEq total) by mouth daily. Debbrah Alar, NP Taking Active   VENTOLIN HFA 108 [phone] Base) MCG/ACT inhaler [id number] No as needed. [provider] Taking Active            Med Note Sadie Haber Nov 20, 2018  4:52 PM) Needs to be refilled          SDOH Screenings   Alcohol Screen:   . Last Alcohol Screening Score (AUDIT):   Depression (PHQ2-9): Low Risk   . PHQ-2 Score: 0  Financial Resource Strain: Medium Risk  . Difficulty of Paying Living Expenses: Somewhat hard  Food Insecurity: No Food Insecurity  . Worried About Charity fundraiser in the Last Year: Never true  . Ran Out of Food in the Last Year: Never true  Housing: Low Risk   . Last Housing Risk Score: 0  Physical Activity: Inactive  . Days of Exercise per Week: 0 days  . Minutes of Exercise per Session: 0 min  Social Connections:   . Frequency of Communication with Friends and Family:   . Frequency of Social Gatherings with Friends and Family:   . Attends Religious Services:   . Active Member of Clubs or Organizations:   . Attends Archivist Meetings:   Marland Kitchen Marital Status:   Stress: Stress Concern Present  . Feeling of Stress : Rather much  Tobacco Use: Medium Risk  . Smoking Tobacco Use: Former Smoker  . Smokeless Tobacco Use: Never Used  Transportation Needs: No Transportation Needs  . Lack of Transportation (Medical): No  . Lack of Transportation (Non-Medical): No   Fall Risk  10/09/2019 08/07/2019 01/12/2019 12/25/2018 11/20/2018  Falls in the past year? 0 0 0 0 0  Number falls in past yr: 0 0 0 - -  Injury with Fall? 0 0 0 - -  Risk for fall due to : Medication side effect;Impaired vision  Medication side effect;Impaired vision Impaired vision;Medication side effect Medication side effect;Mental status change;Impaired mobility;Impaired balance/gait Mental status change;Impaired balance/gait;Medication side effect;Impaired vision  Follow up Falls evaluation completed;Education provided Falls evaluation completed;Education provided Falls evaluation completed Education provided;Falls prevention discussed;Falls evaluation completed Education provided;Falls prevention discussed;Falls evaluation completed   Depression screen [facility] 2/9 10/09/2019 12/26/2017 10/12/2017 09/26/2017  Decreased Interest 0 1 0 0  Down, Depressed, Hopeless 0 2 0 1  PHQ - 2 Score 0 3 0 1  Altered sleeping - 3 - -  Tired, decreased energy - 3 - -  Change in appetite - 1 - -  Feeling bad or failure about yourself  - 1 - -  Trouble concentrating - 0 - -  Moving slowly or fidgety/restless - 0 - -  Suicidal thoughts - 0 - -  PHQ-9 Score - 11 - -  Difficult doing work/chores - Somewhat difficult - -   THN CM Care Plan Problem One     Most Recent Value  Care Plan Problem One Knowledge deficiet related to self management of medications  Role Documenting the Problem One [place] for Problem One Active  THN Long Term Goal  Patient will have no COPD exacerbation of sxs and admisison to hospital over the next 90 days.  THN Long Term Goal Start Date 08/07/19  Interventions for Problem One Long Term Goal RN CM assessed for any acute issues or sxs. RNC M reviewed action plan with pt. RN CM confirmed pt knows s/s of worsening condition and when to seek medical attention. RN CM discussed importance of regular MD appts.     THN CM Care Plan Problem Two     Most Recent Value  Care Plan Problem Two --  [Patient with decreased appetite and weight loss.]  Role Documenting the Problem Two Care Management Telephonic Coordinator  Care Plan for Problem Two Active  THN Long Term Goal Patient will have dental work done to  aide in increasing oral intake over the next 90 days.  THN Long Term Goal Start Date 01/12/19  [facility] Long Term Goal Met Date 10/09/19      Plan: RN CM discussed with patient next outreach within the month of Sept. Patient gave verbal consent and in agreement with RN CM follow up and timeframe. Patient aware that they may contact RN CM sooner for any issues or concerns. RN CM will send quarterly update to PCP.   Enzo Montgomery, RN,BSN,CCM [place] Management Telephonic Care Management Coordinator Direct Phone: [phone] Toll Free: [phone] Fax: [phone]

## 2019-10-12 ENCOUNTER — Ambulatory Visit: Payer: Self-pay

## 2019-10-17 ENCOUNTER — Encounter: Payer: Self-pay | Admitting: Family

## 2019-10-17 ENCOUNTER — Telehealth (INDEPENDENT_AMBULATORY_CARE_PROVIDER_SITE_OTHER): Payer: Medicare HMO | Admitting: Family

## 2019-10-17 ENCOUNTER — Other Ambulatory Visit: Payer: Self-pay

## 2019-10-17 DIAGNOSIS — E7800 Pure hypercholesterolemia, unspecified: Secondary | ICD-10-CM

## 2019-10-17 DIAGNOSIS — E876 Hypokalemia: Secondary | ICD-10-CM

## 2019-10-17 DIAGNOSIS — E039 Hypothyroidism, unspecified: Secondary | ICD-10-CM

## 2019-10-17 DIAGNOSIS — M109 Gout, unspecified: Secondary | ICD-10-CM | POA: Diagnosis not present

## 2019-10-17 DIAGNOSIS — R42 Dizziness and giddiness: Secondary | ICD-10-CM

## 2019-10-17 MED ORDER — MECLIZINE HCL 25 MG PO TABS: 25.0000 mg | ORAL_TABLET | Freq: Two times a day (BID) | ORAL | 0 refills | Status: DC | PRN

## 2019-10-17 MED ORDER — COLCHICINE 0.6 MG PO TABS: ORAL_TABLET | ORAL | 0 refills | Status: DC

## 2019-10-17 MED ORDER — POTASSIUM CHLORIDE CRYS ER 20 MEQ PO TBCR: 20.0000 meq | EXTENDED_RELEASE_TABLET | Freq: Every day | ORAL | 5 refills | Status: DC

## 2019-10-17 NOTE — Progress Notes (Signed)
Virtual Visit via Video Note  I connected with Veronica Wolf on 10/17/19 at  8:40 AM EDT by a video enabled telemedicine application and verified that I am speaking with the correct person using two identifiers.  Location: Patient: home Provider: work   I discussed the limitations of evaluation and management by telemedicine and the availability of in person appointments. The patient expressed understanding and agreed to proceed. Only the patient and myself were present for today's video call.   History of Present Illness:  Patient is an 79 yr old female who presents today for follow up.   Reports "slight cold/cough.  Not too bad." Reports that she has had some wheezing. Reports some improvement with albuterol.  Reports that it started a few days ago.  Denies fever or covid-19 exposures.   Reports some upper back pain, just beneath c-spine.    Gout- maintained on allopurinol. Reports that she had gout in the left knee and now in the right knee.   Hyperlipidemia- maintained on lipitor.  Lab Results  Component Value Date   CHOL 314 (H) 09/15/2017   HDL 78 09/15/2017   LDLCALC 217 (H) 09/15/2017   TRIG 96 09/15/2017   CHOLHDL 4.0 09/15/2017   Anxiety-- maintained on lexapro/valium. Reports anxiety is well controlled.    Hypothyroid-  Lab Results  Component Value Date   TSH 2.66 03/13/2018   Vertigo- reports mild symptoms.  Some improvement with meclizine.   GERD-  Maintained on protonix. Notes occasional gerd symptoms.    Observations/Objective:   Gen: Awake, alert, no acute distress Resp: Breathing is even and non-labored Psych: calm/pleasant demeanor Neuro: Alert and Oriented x 3, + facial symmetry, speech is clear.   Assessment and Plan:  Back pain- recommended that she avoid nsaids due to renal insufficiency. Advised pt to use Tylenol prn. Call if symptoms worsen or if not in a few weeks.   Gout- uncontrolled. She has not been taking allopurinol.  Restart  allopurinol. Will rx with colchicine.  Vertigo- mild symptoms, improved with prn meclizine. Refill sent.   Hyperlipidemia- tolerating statin. Obtain follow up lipid panel.  Viral URI with cough- does not have transportation for covid testing.  Advised pt to let me know if her symptoms worsen or if symptoms fail to improve. Advised her to quarantine at home until she is feeling better.  This visit occurred during the SARS-CoV-2 public health emergency.  Safety protocols were in place, including screening questions prior to the visit, additional usage of staff PPE, and extensive cleaning of exam room while observing appropriate contact time as indicated for disinfecting solutions.       Follow Up Instructions:    I discussed the assessment and treatment plan with the patient. The patient was provided an opportunity to ask questions and all were answered. The patient agreed with the plan and demonstrated an understanding of the instructions.   The patient was advised to call back or seek an in-person evaluation if the symptoms worsen or if the condition fails to improve as anticipated.  Lemont Fillers, NP

## 2019-11-01 ENCOUNTER — Other Ambulatory Visit: Payer: Self-pay | Admitting: Family

## 2019-11-02 ENCOUNTER — Other Ambulatory Visit: Payer: Self-pay | Admitting: Family

## 2019-11-07 ENCOUNTER — Telehealth: Payer: Self-pay | Admitting: Family

## 2019-11-07 NOTE — Telephone Encounter (Signed)
Please call the patient I am unable to assess her problem without seeing her, however if she is certain she has a vaginal yeast infection, okay send Diflucan 150 mg 1 p.o. daily #2 no refills.  If she is not sure, needs office visit.

## 2019-11-07 NOTE — Telephone Encounter (Signed)
Patient states she has a yeast infection and would like [name] to please send in a medication to pharmacy    [facility] [address] [place], [place] - [address]  [address], [place] [address]  Phone:  [phone] Fax:  [phone]  DEA #:  --

## 2019-11-07 NOTE — Telephone Encounter (Signed)
Please review and advise.

## 2019-11-08 ENCOUNTER — Other Ambulatory Visit: Payer: Self-pay

## 2019-11-08 MED ORDER — FLUCONAZOLE 150 MG PO TABS
150.0000 mg | ORAL_TABLET | Freq: Once | ORAL | 0 refills | Status: AC
Start: 2019-11-08 — End: 2019-11-08

## 2019-11-08 NOTE — Telephone Encounter (Signed)
Called patient spoke with her about current issue.  She states that she really feels like she has a yeast infection.   Sent in Diflucan 150 mg, 2 tabs once daily no refills to [facility] on [address]. Patient is aware

## 2019-11-19 ENCOUNTER — Other Ambulatory Visit: Payer: Self-pay | Admitting: Family

## 2019-11-19 DIAGNOSIS — R42 Dizziness and giddiness: Secondary | ICD-10-CM

## 2019-11-29 ENCOUNTER — Other Ambulatory Visit: Payer: Self-pay | Admitting: Family

## 2019-12-10 ENCOUNTER — Other Ambulatory Visit: Payer: Self-pay | Admitting: Family

## 2019-12-12 ENCOUNTER — Other Ambulatory Visit: Payer: Self-pay

## 2019-12-12 NOTE — Patient Outreach (Signed)
[facility]) Care Management  12/12/2019  Veronica Wolf 25-Oct-1940 [id number]   Telephone Assessment    Outreach attempt to patient. Spoke with patient who reported she was still asleep and not up to talking at present.      Plan: RN CM will make outreach attempt to patient within the month of Oct.  Giordan Fordham Magda Paganini [facility] Care Management Telephonic Care Management Coordinator Direct Phone: [phone] Toll Free: [phone] Fax: [phone]

## 2019-12-20 ENCOUNTER — Other Ambulatory Visit: Payer: Self-pay | Admitting: Family

## 2019-12-20 ENCOUNTER — Telehealth: Payer: Self-pay | Admitting: Family

## 2019-12-20 NOTE — Progress Notes (Signed)
  Chronic Care Management   Note  12/20/2019 Name: Veronica Wolf MRN: [id number] DOB: [birth year]  Veronica Wolf is a 79 y.o. year old female who is a primary care patient of [name], NP. I reached out to [name] by phone today in response to a referral sent by [name] PCP, [name], NP.   [name] was given information about Chronic Care Management services today including:  1. CCM service includes personalized support from designated clinical staff supervised by her physician, including individualized plan of care and coordination with other care providers 2. 24/7 contact phone numbers for assistance for urgent and routine care needs. 3. Service will only be billed when office clinical staff spend 20 minutes or more in a month to coordinate care. 4. Only one practitioner may furnish and bill the service in a calendar month. 5. The patient may stop CCM services at any time (effective at the end of the month) by phone call to the office staff.   Patient agreed to services and verbal consent obtained.   Follow up plan:   Carley Perdue [employer] Scheduler

## 2019-12-21 ENCOUNTER — Ambulatory Visit: Payer: Self-pay

## 2019-12-24 ENCOUNTER — Other Ambulatory Visit: Payer: Self-pay | Admitting: Family

## 2019-12-29 ENCOUNTER — Other Ambulatory Visit: Payer: Self-pay | Admitting: Family

## 2020-01-04 ENCOUNTER — Other Ambulatory Visit: Payer: Self-pay

## 2020-01-04 ENCOUNTER — Other Ambulatory Visit: Payer: Self-pay | Admitting: Family

## 2020-01-04 DIAGNOSIS — R42 Dizziness and giddiness: Secondary | ICD-10-CM
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
## 2020-01-07 ENCOUNTER — Telehealth: Payer: Self-pay

## 2020-01-07 NOTE — Telephone Encounter (Signed)
Caller reports that she needs to get a list of her medications for the pharmacy. Caller would like to see if the office can fax to her pharmacy that is on file. Telephone: [phone]

## 2020-01-07 NOTE — Telephone Encounter (Signed)
Called patient about this message and she is really confused. I thing she is referring to [facility] Management with [facility]. Will call her back after checking with provider.

## 2020-01-07 NOTE — Telephone Encounter (Signed)
Patient advised she has a telephone appointment with K. Day with [facility] Wednesday 02-06-20 at 11 am. Patient advised to have her medication bottles with her for telephone consultation.

## 2020-01-21 ENCOUNTER — Other Ambulatory Visit: Payer: Self-pay | Admitting: Family

## 2020-01-22 ENCOUNTER — Ambulatory Visit: Payer: Self-pay

## 2020-02-01 ENCOUNTER — Other Ambulatory Visit: Payer: Self-pay | Admitting: Family Medicine

## 2020-02-01 ENCOUNTER — Telehealth: Payer: Self-pay | Admitting: Family

## 2020-02-01 DIAGNOSIS — R42 Dizziness and giddiness: Secondary | ICD-10-CM

## 2020-02-01 NOTE — Telephone Encounter (Signed)
Patient is requesting a medication list printed for her self.   Patient would like call back once printed

## 2020-02-01 NOTE — Telephone Encounter (Signed)
List provided to patient

## 2020-02-05 ENCOUNTER — Telehealth: Payer: Self-pay | Admitting: Pharmacist
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
## 2020-02-06 ENCOUNTER — Ambulatory Visit: Payer: Medicare HMO | Admitting: Pharmacist

## 2020-02-06 ENCOUNTER — Other Ambulatory Visit: Payer: Self-pay

## 2020-02-06 DIAGNOSIS — E785 Hyperlipidemia, unspecified: Secondary | ICD-10-CM

## 2020-02-06 DIAGNOSIS — R739 Hyperglycemia, unspecified: Secondary | ICD-10-CM

## 2020-02-06 DIAGNOSIS — E039 Hypothyroidism, unspecified: Secondary | ICD-10-CM

## 2020-02-06 NOTE — Chronic Care Management (AMB) (Signed)
Chronic Care Management Pharmacy  Name: Veronica Wolf  MRN: [id number] DOB: [birth year]  Chief Complaint/ HPI  Veronica Wolf,  79 y.o. , female presents for their Initial CCM visit with the clinical pharmacist via telephone due to COVID-19 Pandemic.  PCP : Veronica Alar, NP  Their chronic conditions include: Hyperlipidemia/Hx of MI, Pre-Diabetes, Hypothyroidism, Depression/Anxiety, GERD, Gout   Office Visits: 10/17/19: Visit w/ Veronica Alar, NP - Recommended tylenol for back pain, restart colchicine and allopurinol for gout  08/31/19: Visit w/ Veronica Alar, NP - Bronchitis prescribed zpak and benzonatate. Imodium as needed for diarrhea.  Consult Visit: None in the last 6 months  Medications: Outpatient Encounter Medications as of 02/06/2020  Medication Sig Note  . albuterol (PROVENTIL) (2.5 MG/3ML) 0.083% nebulizer solution USE 1 VIAL IN NEBULIZER EVERY 6 HOURS AS NEEDED FOR WHEEZING AND FOR SHORTNESS OF BREATH   . aspirin EC 81 MG tablet Take 1 tablet (81 mg total) by mouth daily.   Marland Kitchen atorvastatin (LIPITOR) 40 MG tablet Take 1 tablet (40 mg total) by mouth daily at 6 PM.   . diazepam (VALIUM) 10 MG tablet TAKE 1 TABLET BY MOUTH EVERY 12 HOURS AS NEEDED FOR ANXIETY   . dicyclomine (BENTYL) 10 MG capsule Take 1 capsule (10 mg total) by mouth 4 (four) times daily -  before meals and at bedtime.   Marland Kitchen escitalopram (LEXAPRO) 20 MG tablet Take 1 tablet (20 mg total) by mouth daily.   Arna Medici 25 MCG tablet TAKE 1 TABLET BY MOUTH ONCE DAILY BEFORE BREAKFAST   . lisinopril (ZESTRIL) 2.5 MG tablet Take 1 tablet by mouth once daily   . meclizine (ANTIVERT) 25 MG tablet TAKE 1 TABLET BY MOUTH TWICE DAILY AS NEEDED FOR DIZZINESS   . nystatin (NYSTATIN) powder APPLY  POWDER TOPICALLY TO AFFECTED AREA 4 TIMES DAILY   . pantoprazole (PROTONIX) 40 MG tablet Take 1 tablet (40 mg total) by mouth daily.   . potassium chloride SA (KLOR-CON) 20 MEQ tablet Take 1 tablet (20 mEq total)  by mouth daily.   Marland Kitchen allopurinol (ZYLOPRIM) 100 MG tablet Take 2 tablets by mouth once daily (Patient not taking: Reported on 02/06/2020)   . betamethasone dipropionate 0.05 % cream Apply topically 2 (two) times daily. (Patient not taking: Reported on 02/06/2020)   . cetirizine (ZYRTEC) 10 MG tablet Take 10 mg by mouth as needed for allergies. (Patient not taking: Reported on 02/06/2020)   . colchicine 0.6 MG tablet Take 2 tabs by mouth now and then 1 tab in 1 hour (for gout) (Patient not taking: Reported on 02/06/2020)   . fluticasone (FLOVENT HFA) 110 MCG/ACT inhaler Inhale 2 puffs into the lungs 2 (two) times daily. (Patient not taking: Reported on 02/06/2020) 02/06/2020: States she has never taken this med  . metoprolol succinate (TOPROL-XL) 25 MG 24 hr tablet Take 1/2 (one-half) tablet by mouth once daily (Patient not taking: Reported on 02/06/2020)   . nitroGLYCERIN (NITROSTAT) 0.4 MG SL tablet Place 1 tablet (0.4 mg total) under the tongue every 5 (five) minutes as needed.   . VENTOLIN HFA 108 (90 Base) MCG/ACT inhaler as needed. (Patient not taking: Reported on 02/06/2020) 11/20/2018: Needs to be refilled  . [DISCONTINUED] nystatin (MYCOSTATIN/NYSTOP) powder Apply topically 4 (four) times daily.    No facility-administered encounter medications on file as of 02/06/2020.     Current Diagnosis/Assessment:  Goals Addressed            This Visit's Progress   .  Chronic Care Management Pharmacy Care Plan        Social Hx:  She only has one child Daughter recently found out she had diabetes. 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 Her daughter has 6 kids Patient has significant family hx of diabetes on her mother's side. Her mother is the only one who did not have diabetes.    Acute: Dry Mouth    Patient has failed these meds in past: None noted  Patient is currently uncontrolled on the following medications: . Biotene mouth wash  Wonders if dry mouth is "a  bad sign" She has never had this before She wondered if it was her new dentures causing dry mouth, but states she had dry mouth before she had new dentures.   She tried biotene for a few weeks, but hasn't used it consistently since she ran out.   Possible Medication Contributors Dicyclomine Meclizine - Recommended that she not take daily and only take PRN   Plan -Limit meclizine use to as needed vs daily -Continue using Biotene mouth wash  Hypertension Screening   BP goal is:  <130/80  Office blood pressures are  BP Readings from Last 3 Encounters:  06/12/18 (!) 95/52  01/23/18 101/67  11/28/17 (!) 131/50   Patient checks BP at home infrequently Patient home BP readings are ranging: Unable to assess  Patient has failed these meds in the past: None noted  Patient is currently controlled on the following medications:  Marland Kitchen Metoprolol Succinate 64m 0.5 tab daily (states she is not taking) . Lisinopril 2.582mdaily  We discussed BP goal, request for patient to check her blood pressure for further assessment  Plan -Continue current medications  -Check BP 2-3 times per week and record  Hyperlipidemia/Hx of MI   LDL goal < 70  Last lipids Lab Results  Component Value Date   CHOL 314 (H) 09/15/2017   HDL 78 09/15/2017   LDLCALC 217 (H) 09/15/2017   TRIG 96 09/15/2017   CHOLHDL 4.0 09/15/2017   Hepatic Function Latest Ref Rng & Units 01/23/2018 09/26/2017 09/14/2017  Total Protein 6.0 - 8.3 g/dL 6.5 6.1 7.3  Albumin 3.5 - 5.2 g/dL 3.9 3.6 3.9  AST 0 - 37 U/L _0 ALT 0 - 35 U/L _1 Alk Phosphatase 39 - 117 U/L 84 65 62  Total Bilirubin 0.2 - 1.2 mg/dL 0.3 0.4 0.4     The ASCVD Risk score ([place] et al., 2013) failed to calculate for the following reasons:   The 2013 ASCVD risk score is only valid for ages 4030o 7940 The patient has a prior MI or stroke diagnosis   Patient has failed these meds in past: None noted  Patient is currently uncontrolled  on the following medications:  . Atorvastatin 4070maily  . Metoprolol Succinate 33m68m2 tab daily  Admits she forgets to take atorvastatin Pt would benefit from updated lipid panel  We discussed:  LDL goal and importance of medication adherence  Plan -Start taking atorvastatin daily -Update lipid panel  Pre-Diabetes   A1c goal <6.5%  Recent Relevant Labs: Lab Results  Component Value Date/Time   HGBA1C 6.1 06/12/2018 01:58 PM   HGBA1C 6.1 (H) 09/15/2017 03:20 AM   GFR 39.92 (L) 06/12/2018 01:58 PM   GFR 44.49 (L) 02/01/2018 01:39 PM    Last diabetic Eye exam: No results found for: HMDIABEYEEXA  Last diabetic Foot  exam: No results found for: HMDIABFOOTEX   Patient has failed these meds in past: None noted  Patient is currently controlled on the following medications: . None  Diet Snacks - Hard candy Special educational needs teacher and Life Savers - her favorite) for her dry mouth, Candy, chocolate, and ice-cream lover Drinks - No sodas, sometimes lemonade, drinks water, but can't quantify how much  We thoroughly discussed carbohydrate counting 3 jolly ranchers = 17 g of carbs 4 life savers = 14 g of carbs  We discussed: diet and exercise extensively  Plan -Continue control with diet and exercise  Hypothyroidism   Lab Results  Component Value Date/Time   TSH 2.66 03/13/2018 10:27 AM   TSH 6.46 (H) 01/23/2018 10:33 AM   FREET4 0.85 01/24/2018 03:04 PM    Patient has failed these meds in past: None noted  Patient is currently controlled on the following medications:  . Euthyrox 65mg daily  Admits she has not been taking this medication daily Patient would benefit from updated TSH  Plan -Repeat TSH at next office visit -Continue current medications  Depression/Anxiety   Assess further as followup  Depression screen [facility] 10/09/2019 12/26/2017 10/12/2017  Decreased Interest 0 1 0  Down, Depressed, Hopeless 0 2 0  PHQ - 2 Score 0 3 0  Altered sleeping - 3 -  Tired,  decreased energy - 3 -  Change in appetite - 1 -  Feeling bad or failure about yourself  - 1 -  Trouble concentrating - 0 -  Moving slowly or fidgety/restless - 0 -  Suicidal thoughts - 0 -  PHQ-9 Score - 11 -  Difficult doing work/chores - Somewhat difficult -    Patient has failed these meds in past: citalopram (inefficacy?) Patient is currently controlled on the following medications:  . Escitalopram 234mdaily . Diazepam 1022mvery 12 hours as needed   Diazepam use: about 3 times per week  Plan -Continue current medications  GERD/Hx of Peptic Ulcer and Diverticulitis    Assess further at follow up  Patient has failed these meds in past: None noted  Patient is currently controlled on the following medications:  . Pantoprazole 85m74mily  Plan -Continue current medications  Gout   Uric Acid, Serum  Date Value Ref Range Status  03/22/2017 8.4 (H) 2.4 - 7.0 mg/dL Final     Goal Uric Acid < 6 mg/dL   Medications that may increase uric acid levels: Aspirin  Last gout flare: About a month ago (took colchicine, but did not restart allopurinol)  Patient has failed these meds in past: None noted  Patient is currently controlled on the following medications:  . Allopurinol 100mg67mly (admits she has not been taking this medication)  Allopurinol Last fill date 09/25/19 - 30 DS  She feels her most recent episode of gout was the first time in years. Does not know why she should take allopurinol if she hasn't had it frequently. Discussed indication for allopurinol and patient decided she would not like to continue allopurinol therapy and would rather treat the acute attacks.    Plan -Continue current medications  Rash    Patient has failed these meds in past: None noted  Patient is currently controlled on the following medications: . Nystatin Powder (not using due to cost) . OTC Medicated Powder   Worst under breast and between her legs Uses a medicated powder  and this is helping Nystatin expensive  Plan -Continue current medications  Vaccines  Reviewed and discussed patient's vaccination history.    Immunization History  Administered Date(s) Administered  . Influenza, High Dose Seasonal PF 01/23/2018, 12/07/2018  . Influenza-Unspecified 12/23/2018  . PFIZER SARS-COV-2 Vaccination 05/14/2019, 06/08/2019  . Pneumococcal Conjugate-13 07/16/2015  . Pneumococcal Polysaccharide-23 12/23/2016  . Tdap 09/02/2009, 04/05/2017    Plan -Recommended patient receive Shingrix vaccine in pharmacy.   Medication Management   Pt uses [facility] pharmacy for all medications Uses pill box? Yes  Pt admits to not taking medications as prescribed and would benefit from adherence packaging.  She also struggles with transportation and would benefit from having her medications delivered to her home.   We discussed: Verbal consent obtained for [facility] enhanced pharmacy services (medication synchronization, adherence packaging, delivery coordination). A medication sync plan was created to allow patient to get all medications delivered once every 30 to 90 days per patient preference. Patient understands they have freedom to choose pharmacy and clinical pharmacist will coordinate care between all prescribers and [facility].   Plan  Utilize [facility] pharmacy for medication synchronization, packaging and delivery    Follow up:  3 month phone visit  De Blanch, PharmD Clinical Pharmacist [place] [facility] [phone]

## 2020-02-12 ENCOUNTER — Other Ambulatory Visit: Payer: Self-pay

## 2020-02-12 DIAGNOSIS — R739 Hyperglycemia, unspecified: Secondary | ICD-10-CM

## 2020-02-12 DIAGNOSIS — E039 Hypothyroidism, unspecified: Secondary | ICD-10-CM

## 2020-02-12 DIAGNOSIS — E7800 Pure hypercholesterolemia, unspecified: Secondary | ICD-10-CM

## 2020-02-23 ENCOUNTER — Other Ambulatory Visit: Payer: Self-pay | Admitting: Family Medicine

## 2020-02-23 ENCOUNTER — Other Ambulatory Visit: Payer: Self-pay | Admitting: Family

## 2020-02-23 NOTE — Patient Instructions (Signed)
Visit Information  Goals Addressed            This Visit's Progress   . Chronic Care Management Pharmacy Care Plan       CARE PLAN ENTRY (see longitudinal plan of care for additional care plan information)  Current Barriers:  . Chronic Disease Management support, education, and care coordination needs related to Hyperlipidemia/Hx of MI, Pre-Diabetes, Hypothyroidism, Depression/Anxiety, GERD, Gout    Hypertension Screening BP Readings from Last 3 Encounters:  06/12/18 (!) 95/52  01/23/18 101/67  11/28/17 (!) 131/50   . Pharmacist Clinical Goal(s): o Over the next 90 days, patient will work with PharmD and providers to maintain BP goal <130/80 . Current regimen:  . Metoprolol Succinate 25mg  0.5 tab daily . Lisinopril 2.5mg  daily . Interventions: o Discussed blood pressure goal o Request patient to check blood pressure 2-3 times per week and record . Patient self care activities - Over the next 90 days, patient will: o Maintain blood pressure less than 130/80 o Check blood pressure 2-3 times per week and record  Hyperlipidemia/Hx of MI Lab Results  Component Value Date/Time   LDLCALC 217 (H) 09/15/2017 03:20 AM   . Pharmacist Clinical Goal(s): o Over the next 180 days, patient will work with PharmD and providers to maintain LDL goal < 70 . Current regimen:  . Atorvastatin 40mg  daily  . Metoprolol Succinate 25mg  1/2 tab daily . Interventions: o Discussed LDL goal and importance of medication adherence o Repeat LDL at next office visit  . Patient self care activities - Over the next 90 days, patient will: o Maintain cholesterol medication regimen.   Pre-Diabetes Lab Results  Component Value Date/Time   HGBA1C 6.1 06/12/2018 01:58 PM   HGBA1C 6.1 (H) 09/15/2017 03:20 AM   . Pharmacist Clinical Goal(s): o Over the next 90 days, patient will work with PharmD and providers to maintain A1c goal <6.5% . Current regimen:  o Diet and exercise management    . Interventions: o Discussed the importance of limiting carbohydrates (30-45 grams per meal) . Patient self care activities - Over the next 90 days, patient will: o Maintain a1c <6.5%  Hypothyroidism . Pharmacist Clinical Goal(s) o Over the next 90 days, patient will work with PharmD and providers to maintain TSH within normal limits and reduce risk of symptoms associated with hypothyroidism . Current regimen:  o Euthyrox daily . Interventions: o Discussed importance of medication adherence o Repeat TSH at next visit  . Patient self care activities - Over the next 90 days, patient will: o Maintain hypothyroidism medication regimen  Medication management . Pharmacist Clinical Goal(s): o Over the next 90 days, patient will work with PharmD and providers to achieve optimal medication adherence . Current [facility]: [facility] to [facility] . Interventions o Comprehensive medication review performed. o Utilize [facility] for medication synchronization, packaging and delivery . Patient self care activities - Over the next 90 days, patient will: o Focus on medication adherence by filling and taking medications appropriately  o Take medications as prescribed o Report any questions or concerns to PharmD and/or provider(s)  Initial goal documentation        Veronica Wolf was given information about Chronic Care Management services today including:  1. CCM service includes personalized support from designated clinical staff supervised by her physician, including individualized plan of care and coordination with other care providers 2. 24/7 contact phone numbers for assistance for urgent and routine care needs. 3. Standard insurance, coinsurance, copays and deductibles  apply for chronic care management only during months in which we provide at least 20 minutes of these services. Most insurances cover these services at 100%, however patients may be responsible for any copay,  coinsurance and/or deductible if applicable. This service may help you avoid the need for more expensive face-to-face services. 4. Only one practitioner may furnish and bill the service in a calendar month. 5. The patient may stop CCM services at any time (effective at the end of the month) by phone call to the office staff.  Verbal consent obtained for [facility] enhanced pharmacy services (medication synchronization, adherence packaging, delivery coordination). A medication sync plan was created to allow patient to get all medications delivered once every 30 to 90 days per patient preference. Patient understands they have freedom to choose pharmacy and clinical pharmacist will coordinate care between all prescribers and [facility].   Patient agreed to services and verbal consent obtained.   The patient verbalized understanding of instructions, educational materials, and care plan provided today and agreed to receive a mailed copy of patient instructions, educational materials, and care plan.  Telephone follow up appointment with pharmacy team member scheduled for: 05/08/2020  Veronica Wolf Veronica Wolf, PharmD Clinical Pharmacist [facility] [phone]   Prediabetes Eating Plan Prediabetes is a condition that causes blood sugar (glucose) levels to be higher than normal. This increases the risk for developing diabetes. In order to prevent diabetes from developing, your health care provider may recommend a diet and other lifestyle changes to help you:  Control your blood glucose levels.  Improve your cholesterol levels.  Manage your blood pressure. Your health care provider may recommend working with a diet and nutrition specialist (dietitian) to make a meal plan that is best for you. What are tips for following this plan? Lifestyle  Set weight loss goals with the help of your health care team. It is recommended that most people with prediabetes lose 7%  of their current body weight.  Exercise for at least 30 minutes at least 5 days a week.  Attend a support group or seek ongoing support from a mental health counselor.  Take over-the-counter and prescription medicines only as told by your health care provider. Reading food labels  Read food labels to check the amount of fat, salt (sodium), and sugar in prepackaged foods. Avoid foods that have: ? Saturated fats. ? Trans fats. ? Added sugars.  Avoid foods that have more than 300 milligrams (mg) of sodium per serving. Limit your daily sodium intake to less than 2,300 mg each Jobanny Mavis. Shopping  Avoid buying pre-made and processed foods. Cooking  Cook with olive oil. Do not use butter, lard, or ghee.  Bake, broil, grill, or boil foods. Avoid frying. Meal planning   Work with your dietitian to develop an eating plan that is right for you. This may include: ? Tracking how many calories you take in. Use a food diary, notebook, or mobile application to track what you eat at each meal. ? Using the glycemic index (GI) to plan your meals. The index tells you how quickly a food will raise your blood glucose. Choose low-GI foods. These foods take a longer time to raise blood glucose.  Consider following a Mediterranean diet. This diet includes: ? Several servings each Aamina Skiff of fresh fruits and vegetables. ? Eating fish at least twice a week. ? Several servings each Erna Brossard of whole grains, beans, nuts, and seeds. ? Using olive oil instead of other fats. ?  Moderate alcohol consumption. ? Eating small amounts of red meat and whole-fat dairy.  If you have high blood pressure, you may need to limit your sodium intake or follow a diet such as the DASH eating plan. DASH is an eating plan that aims to lower high blood pressure. What foods are recommended? The items listed below may not be a complete list. Talk with your dietitian about what dietary choices are best for you. Grains Whole grains, such as  whole-wheat or whole-grain breads, crackers, cereals, and pasta. Unsweetened oatmeal. Bulgur. Barley. Quinoa. Brown rice. Corn or whole-wheat flour tortillas or taco shells. Vegetables Lettuce. Spinach. Peas. Beets. Cauliflower. Cabbage. Broccoli. Carrots. Tomatoes. Squash. Eggplant. Herbs. Peppers. Onions. Cucumbers. Brussels sprouts. Fruits Berries. Bananas. Apples. Oranges. Grapes. Papaya. Mango. Pomegranate. Kiwi. Grapefruit. Cherries. Meats and other protein foods Seafood. Poultry without skin. Lean cuts of pork and beef. Tofu. Eggs. Nuts. Beans. Dairy Low-fat or fat-free dairy products, such as yogurt, cottage cheese, and cheese. Beverages Water. Tea. Coffee. Sugar-free or diet soda. Seltzer water. Lowfat or no-fat milk. Milk alternatives, such as soy or almond milk. Fats and oils Olive oil. Canola oil. Sunflower oil. Grapeseed oil. Avocado. Walnuts. Sweets and desserts Sugar-free or low-fat pudding. Sugar-free or low-fat ice cream and other frozen treats. Seasoning and other foods Herbs. Sodium-free spices. Mustard. Relish. Low-fat, low-sugar ketchup. Low-fat, low-sugar barbecue sauce. Low-fat or fat-free mayonnaise. What foods are not recommended? The items listed below may not be a complete list. Talk with your dietitian about what dietary choices are best for you. Grains Refined white flour and flour products, such as bread, pasta, snack foods, and cereals. Vegetables Canned vegetables. Frozen vegetables with butter or cream sauce. Fruits Fruits canned with syrup. Meats and other protein foods Fatty cuts of meat. Poultry with skin. Breaded or fried meat. Processed meats. Dairy Full-fat yogurt, cheese, or milk. Beverages Sweetened drinks, such as sweet iced tea and soda. Fats and oils Butter. Lard. Ghee. Sweets and desserts Baked goods, such as cake, cupcakes, pastries, cookies, and cheesecake. Seasoning and other foods Spice mixes with added salt. Ketchup. Barbecue  sauce. Mayonnaise. Summary  To prevent diabetes from developing, you may need to make diet and other lifestyle changes to help control blood sugar, improve cholesterol levels, and manage your blood pressure.  Set weight loss goals with the help of your health care team. It is recommended that most people with prediabetes lose 7 percent of their current body weight.  Consider following a Mediterranean diet that includes plenty of fresh fruits and vegetables, whole grains, beans, nuts, seeds, fish, lean meat, low-fat dairy, and healthy oils. This information is not intended to replace advice given to you by your health care provider. Make sure you discuss any questions you have with your health care provider. Document Revised: 07/14/2018 Document Reviewed: 05/26/2016 Elsevier Patient Education  2020 [employer].

## 2020-02-25 ENCOUNTER — Telehealth: Payer: Self-pay | Admitting: Family

## 2020-02-25 NOTE — Telephone Encounter (Signed)
Requesting: diazepam 10mg  Contract: 06/28/19 UDS: 07/03/2019 Last Visit: 10/17/2019 Next Visit: None scheduled Last Refill: 12/24/2019 #45 and 0RF Pt sig: 1 tab q12h prn  Please Advise

## 2020-02-25 NOTE — Telephone Encounter (Signed)
Patient advised to go to the same pharmacy with her information card and they will let her know If she is die for it

## 2020-02-25 NOTE — Telephone Encounter (Signed)
Patient states she would like to know if it ok for her to get the booster shot for covid 19

## 2020-02-26 ENCOUNTER — Other Ambulatory Visit: Payer: Self-pay

## 2020-02-26 DIAGNOSIS — R42 Dizziness and giddiness: Secondary | ICD-10-CM

## 2020-02-26 MED ORDER — MECLIZINE HCL 25 MG PO TABS: ORAL_TABLET | ORAL | 1 refills | Status: DC

## 2020-02-26 MED ORDER — PANTOPRAZOLE SODIUM 40 MG PO TBEC
40.0000 mg | DELAYED_RELEASE_TABLET | Freq: Every day | ORAL | 1 refills | Status: DC
Start: 2020-02-26 — End: 2020-04-09

## 2020-02-26 MED ORDER — DICYCLOMINE HCL 10 MG PO CAPS
10.0000 mg | ORAL_CAPSULE | Freq: Four times a day (QID) | ORAL | 1 refills | Status: DC
Start: 2020-02-26 — End: 2020-07-30

## 2020-02-26 MED ORDER — ATORVASTATIN CALCIUM 40 MG PO TABS
40.0000 mg | ORAL_TABLET | Freq: Every day | ORAL | 1 refills | Status: DC
Start: 2020-02-26 — End: 2020-08-12

## 2020-02-26 MED ORDER — LISINOPRIL 2.5 MG PO TABS
2.5000 mg | ORAL_TABLET | Freq: Every day | ORAL | 1 refills | Status: DC
Start: 2020-02-26 — End: 2020-08-12

## 2020-02-26 MED ORDER — ALLOPURINOL 100 MG PO TABS
200.0000 mg | ORAL_TABLET | Freq: Every day | ORAL | 1 refills | Status: DC
Start: 2020-02-26 — End: 2020-08-12

## 2020-02-26 MED ORDER — POTASSIUM CHLORIDE CRYS ER 20 MEQ PO TBCR
20.0000 meq | EXTENDED_RELEASE_TABLET | Freq: Every day | ORAL | 1 refills | Status: DC
Start: 2020-02-26 — End: 2020-08-12

## 2020-02-26 MED ORDER — LEVOTHYROXINE SODIUM 25 MCG PO TABS
ORAL_TABLET | ORAL | 1 refills | Status: DC
Start: 2020-02-26 — End: 2020-08-12

## 2020-02-26 MED ORDER — ESCITALOPRAM OXALATE 20 MG PO TABS
20.0000 mg | ORAL_TABLET | Freq: Every day | ORAL | 1 refills | Status: DC
Start: 2020-02-26 — End: 2020-08-11

## 2020-03-03 ENCOUNTER — Telehealth: Payer: Self-pay | Admitting: Family

## 2020-03-03 MED ORDER — DIAZEPAM 10 MG PO TABS: 10.0000 mg | ORAL_TABLET | Freq: Two times a day (BID) | ORAL | 0 refills | Status: DC | PRN

## 2020-03-03 NOTE — Telephone Encounter (Signed)
See rx. [facility] will [facility] next month.

## 2020-03-05 ENCOUNTER — Telehealth: Payer: Self-pay | Admitting: Family

## 2020-03-05 NOTE — Telephone Encounter (Signed)
Advised patient, per Efraim Kaufmann she needs in person evaluation. She is very hesitant to come I and will like to wait if it gets better. She said "if not better by tomorrow she will call for appointment"

## 2020-03-05 NOTE — Telephone Encounter (Signed)
Patient states she is extremely sore and would ike to know if Veronica Wolf could tel her what to do about it .

## 2020-03-14 ENCOUNTER — Ambulatory Visit (INDEPENDENT_AMBULATORY_CARE_PROVIDER_SITE_OTHER): Payer: Medicare HMO | Admitting: Family

## 2020-03-14 ENCOUNTER — Other Ambulatory Visit: Payer: Self-pay

## 2020-03-14 ENCOUNTER — Telehealth: Payer: Self-pay | Admitting: Family

## 2020-03-14 ENCOUNTER — Encounter: Payer: Self-pay | Admitting: Family

## 2020-03-14 DIAGNOSIS — Z23 Encounter for immunization: Secondary | ICD-10-CM

## 2020-03-14 DIAGNOSIS — E039 Hypothyroidism, unspecified: Secondary | ICD-10-CM | POA: Diagnosis not present

## 2020-03-14 DIAGNOSIS — R1013 Epigastric pain: Secondary | ICD-10-CM

## 2020-03-14 DIAGNOSIS — E7800 Pure hypercholesterolemia, unspecified: Secondary | ICD-10-CM | POA: Diagnosis not present

## 2020-03-14 DIAGNOSIS — F419 Anxiety disorder, unspecified: Secondary | ICD-10-CM

## 2020-03-14 DIAGNOSIS — M109 Gout, unspecified: Secondary | ICD-10-CM | POA: Diagnosis not present

## 2020-03-14 DIAGNOSIS — Z79899 Other long term (current) drug therapy: Secondary | ICD-10-CM

## 2020-03-14 DIAGNOSIS — R42 Dizziness and giddiness: Secondary | ICD-10-CM | POA: Diagnosis not present

## 2020-03-14 MED ORDER — MECLIZINE HCL 25 MG PO TABS: ORAL_TABLET | ORAL | 0 refills | Status: DC

## 2020-03-14 MED ORDER — NYSTATIN 100000 UNIT/GM EX POWD: CUTANEOUS | 1 refills | Status: DC

## 2020-03-14 NOTE — Progress Notes (Signed)
Subjective:    Patient ID: Veronica Wolf, female    DOB: 1941-01-18, 79 y.o.   MRN: [id number]  HPI  Patient is an 79 yr old female who presents today for follow up.  Hyperlipidemia-she is written for a atorvastatin 40 mg once daily.  It appears that she was not taking it last time we checked. Lab Results  Component Value Date   CHOL 314 (H) 09/15/2017   HDL 78 09/15/2017   LDLCALC 217 (H) 09/15/2017   TRIG 96 09/15/2017   CHOLHDL 4.0 09/15/2017   Vertigo- uses meclizine prn.  She reports that she continues to have some intermittent vertigo symptoms.  Depression/Anxiety-  Maintained on valium 10mg  bid prn as well as lexapro 20mg .   Hypothyroid- maintained on synthroid once daily.  Lab Results  Component Value Date   TSH 2.66 03/13/2018   GERD- maintained on pantoprazole 40mg .    Gout-reports no recent gout symptoms. She uses colchicine on a as needed basis.  She is complaining of epigastric pain x 2 days.  She is taking pantoprazole. She reports that her appetite has been poor for 1 month. She reports that she is sleeping a lot. She states that she has been taking lexapro.    Wt Readings from Last 3 Encounters:  03/14/20 191 lb (86.6 kg)  06/12/18 203 lb 3.2 oz (92.2 kg)  01/23/18 208 lb (94.3 kg)     Review of Systems    See HPI  Past Medical History:  Diagnosis Date  . Allergic rhinitis   . Anxiety   . CKD (chronic kidney disease) 04/04/2017  . COPD (chronic obstructive pulmonary disease) (HCC)   . Depression   . GERD (gastroesophageal reflux disease)   . Hyperlipidemia   . Hypertension   . Peptic ulcer 01/04/2003  . Vertigo      Social History   Socioeconomic History  . Marital status: Widowed    Spouse name: Not on file  . Number of children: Not on file  . Years of education: Not on file  . Highest education level: Not on file  Occupational History  . Not on file  Tobacco Use  . Smoking status: Former 01/25/18  . Smokeless tobacco: Never  Used  Vaping Use  . Vaping Use: Never used  Substance and Sexual Activity  . Alcohol use: No    Comment: has previous hx of ETOH abuse, quit 2014  . Drug use: No  . Sexual activity: Not on file  Other Topics Concern  . 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 Worried About [place] in the Last Year: Never true  . Ran Out of Food in the Last Year: Never true  Transportation Needs: No Transportation Needs  . Lack of Transportation (Medical): No  . Lack of Transportation (Non-Medical): No  Physical Activity: Not on file  Stress: Not on file  Social Connections: Not on file  Intimate Partner Violence: Not on file    Past Surgical History:  Procedure Laterality Date  . ABDOMINAL HYSTERECTOMY    . LEFT HEART CATH AND CORONARY ANGIOGRAPHY N/A 06/13/2017   Procedure: LEFT HEART CATH AND CORONARY ANGIOGRAPHY;  Surgeon: [employer], MD;  Location: [facility] INVASIVE CV LAB;  Service: Cardiovascular;  Laterality: N/A;  . RIGHT/LEFT HEART CATH AND CORONARY ANGIOGRAPHY N/A 09/16/2017   Procedure: RIGHT/LEFT HEART CATH AND CORONARY ANGIOGRAPHY;  Surgeon: Swaziland, Peter M, MD;  Location: [facility] INVASIVE CV LAB;  Service: Cardiovascular;  Laterality: N/A;  . ULTRASOUND GUIDANCE FOR VASCULAR ACCESS  09/16/2017   Procedure: Ultrasound Guidance For Vascular Access;  Surgeon: Swaziland, Peter M, MD;  Location: [facility] INVASIVE CV LAB;  Service: Cardiovascular;;    Family History  Problem Relation Age of Onset  . Breast cancer Mother   . Stroke Father     Allergies  Allergen Reactions  . Ibuprofen Other (See Comments)    Pt has hx of peptic ulcer and diverticulitis.     Current Outpatient Medications on File Prior to Visit  Medication Sig Dispense Refill  . albuterol (PROVENTIL) (2.5 MG/3ML)  0.083% nebulizer solution USE 1 VIAL IN NEBULIZER EVERY 6 HOURS AS NEEDED FOR WHEEZING AND FOR SHORTNESS OF BREATH 150 mL 0  . allopurinol (ZYLOPRIM) 100 MG tablet Take 2 tablets (200 mg total) by mouth daily. 180 tablet 1  . aspirin EC 81 MG tablet Take 1 tablet (81 mg total) by mouth daily. 90 tablet 3  . atorvastatin (LIPITOR) 40 MG tablet Take 1 tablet (40 mg total) by mouth daily at 6 PM. 90 tablet 1  . betamethasone dipropionate 0.05 % cream Apply topically 2 (two) times daily. 30 g 0  . cetirizine (ZYRTEC) 10 MG tablet Take 10 mg by mouth as needed for allergies.    Marland Kitchen colchicine 0.6 MG tablet Take 2 tabs by mouth now and then 1 tab in 1 hour (for gout) 3 tablet 0  . diazepam (VALIUM) 10 MG tablet Take 1 tablet (10 mg total) by mouth every 12 (twelve) hours as needed. for anxiety 135 tablet 0  . dicyclomine (BENTYL) 10 MG capsule Take 1 capsule (10 mg total) by mouth in the morning, at noon, in the evening, and at bedtime. 360 capsule 1  . escitalopram (LEXAPRO) 20 MG tablet Take 1 tablet (20 mg total) by mouth daily. 90 tablet 1  . fluticasone (FLOVENT HFA) 110 MCG/ACT inhaler Inhale 2 puffs into the lungs 2 (two) times daily. 1 Inhaler 5  . levothyroxine (EUTHYROX) 25 MCG tablet TAKE 1 TABLET BY MOUTH ONCE DAILY BEFORE BREAKFAST 90 tablet 1  . lisinopril (ZESTRIL) 2.5 MG tablet Take 1 tablet (2.5 mg total) by mouth daily. 90 tablet 1  . meclizine (ANTIVERT) 25 MG tablet TAKE 1 TABLET BY MOUTH TWICE DAILY AS NEEDED FOR DIZZINESS 90 tablet 1  . metoprolol succinate (TOPROL-XL) 25 MG 24 hr tablet Take 0.5 tablets (12.5 mg total) by mouth daily. 45 tablet 0  . pantoprazole (PROTONIX) 40 MG tablet Take 1 tablet (40 mg total) by mouth daily. 90 tablet 1  . potassium chloride SA (KLOR-CON) 20 MEQ tablet Take 1 tablet (20 mEq total) by mouth daily. 90 tablet 1  . VENTOLIN HFA 108 (90 Base) MCG/ACT inhaler as needed.    . nitroGLYCERIN (NITROSTAT) 0.4 MG SL tablet Place 1 tablet (0.4 mg total) under  the tongue every 5 (five) minutes as needed. 11 tablet 5  . [DISCONTINUED] nystatin (MYCOSTATIN/NYSTOP) powder Apply topically 4 (four) times daily. 45 g 1   No current facility-administered medications on file prior to visit.    BP (!) 119/49 (BP Location: Right Arm, Patient Position: Sitting, Cuff Size: Large)   Pulse 80   Temp 97.8 F (36.6 C) (Oral)   Resp 16  Ht 5\' 4"  (1.626 m)   Wt 191 lb (86.6 kg)   SpO2 98%   BMI 32.79 kg/m    Objective:   Physical Exam Constitutional:      Appearance: She is well-developed and well-nourished.  Cardiovascular:     Rate and Rhythm: Normal rate and regular rhythm.     Heart sounds: Normal heart sounds. No murmur heard.   Pulmonary:     Effort: Pulmonary effort is normal. No respiratory distress.     Breath sounds: Normal breath sounds. No wheezing.  Abdominal:     General: Bowel sounds are normal.     Palpations: Abdomen is soft.     Comments: Reproducible epigastric tenderness without guarding.  Skin:    Comments: Fungal rash noted beneath both breasts  Psychiatric:        Mood and Affect: Mood and affect normal.        Behavior: Behavior normal.        Thought Content: Thought content normal.        Judgment: Judgment normal.           Assessment & Plan:  Hyperlipidemia-obtain follow-up lipid panel.  Continue atorvastatin 40 mg.  Intertrigo-Rx sent for nystatin powder.  Anxiety-depression-fair control.  Continue Lexapro and Valium.  UDS is updated today as well as a controlled substance contract.  Epigastric pain-EKG is performed today and is compared to previous EKG.  EKG notes normal sinus rhythm and appears unchanged compared to previous EKG.  We will continue Protonix.  Check LFTs and lipase.  I would like to check her renal function and if okay, plan to proceed with a CT of the abdomen and pelvis next week.  She is advised to go to the ER if she develops chest pain, shortness of breath or severe worsening abdominal  pain.  In the meantime recommended Tums and Tylenol as needed for pain.  Vertigo-continue as needed meclizine.  Hypothyroid- stable, continue synthroid . Obtain follow up tsh.   Hyperlipidemia-obtain follow-up lipid panel.  Continue atorvastatin 40 mg once daily.  60 minutes spent on today's visit. The majority of the time was spent counseling patient.   This visit occurred during the SARS-CoV-2 public health emergency.  Safety protocols were in place, including screening questions prior to the visit, additional usage of staff PPE, and extensive cleaning of exam room while observing appropriate contact time as indicated for disinfecting solutions.

## 2020-03-14 NOTE — Telephone Encounter (Signed)
Patient states she is going to keep [facility], she is not going to change

## 2020-03-14 NOTE — Patient Instructions (Signed)
Please complete lab work prior to leaving.  You should be contacted about scheduling your CT scan next week. Go to the ER if you develop severe/worsening abdominal pain, nausea/vomitting, chest pain or shortness of breath.

## 2020-03-15 ENCOUNTER — Encounter: Payer: Self-pay | Admitting: Family

## 2020-03-15 LAB — COMPREHENSIVE METABOLIC PANEL
AG Ratio: 1.2 (calc) (ref 1.0–2.5)
ALT: 6 U/L (ref 6–29)
AST: 11 U/L (ref 10–35)
Albumin: 3.7 g/dL (ref 3.6–5.1)
Alkaline phosphatase (APISO): 71 U/L (ref 37–153)
BUN/Creatinine Ratio: 9 (calc) (ref 6–22)
BUN: 13 mg/dL (ref 7–25)
CO2: 23 mmol/L (ref 20–32)
Calcium: 8.9 mg/dL (ref 8.6–10.4)
Chloride: 108 mmol/L (ref 98–110)
Creat: 1.45 mg/dL — ABNORMAL HIGH (ref 0.60–0.88)
Globulin: 3.2 g/dL (calc) (ref 1.9–3.7)
Glucose, Bld: 94 mg/dL (ref 65–99)
Potassium: 4.7 mmol/L (ref 3.5–5.3)
Sodium: 142 mmol/L (ref 135–146)
Total Bilirubin: 0.4 mg/dL (ref 0.2–1.2)
Total Protein: 6.9 g/dL (ref 6.1–8.1)

## 2020-03-15 LAB — TSH: TSH: 1.77 mIU/L (ref 0.40–4.50)

## 2020-03-15 LAB — LIPASE: Lipase: 31 U/L (ref 7–60)

## 2020-03-15 LAB — LIPID PANEL
Cholesterol: 172 mg/dL (ref <200)
HDL: 35 mg/dL — ABNORMAL LOW (ref >=50)
LDL Cholesterol (Calc): 108 mg/dL (calc) — ABNORMAL HIGH
Non-HDL Cholesterol (Calc): 137 mg/dL (calc) — ABNORMAL HIGH (ref <130)
Total CHOL/HDL Ratio: 4.9 (calc) (ref <5.0)
Triglycerides: 170 mg/dL — ABNORMAL HIGH (ref <150)

## 2020-03-15 LAB — URIC ACID: Uric Acid, Serum: 8.4 mg/dL — ABNORMAL HIGH (ref 2.5–7.0)

## 2020-03-17 NOTE — Progress Notes (Signed)
Mailed out to pt 

## 2020-03-19 LAB — DRUG TOX MONITOR 1 W/CONF, ORAL FLD
Alprazolam: NEGATIVE ng/mL (ref <0.50)
Amphetamines: NEGATIVE ng/mL (ref <10)
Barbiturates: NEGATIVE ng/mL (ref <10)
Benzodiazepines: POSITIVE ng/mL — AB (ref <0.50)
Buprenorphine: NEGATIVE ng/mL (ref <0.10)
Chlordiazepoxide: NEGATIVE ng/mL (ref <0.50)
Clonazepam: NEGATIVE ng/mL (ref <0.50)
Cocaine: NEGATIVE ng/mL (ref <5.0)
Diazepam: 2.28 ng/mL — ABNORMAL HIGH (ref <0.50)
Fentanyl: NEGATIVE ng/mL (ref <0.10)
Flunitrazepam: NEGATIVE ng/mL (ref <0.50)
Flurazepam: NEGATIVE ng/mL (ref <0.50)
Heroin Metabolite: NEGATIVE ng/mL (ref <1.0)
Lorazepam: NEGATIVE ng/mL (ref <0.50)
MARIJUANA: NEGATIVE ng/mL (ref <2.5)
MDMA: NEGATIVE ng/mL (ref <10)
Meprobamate: NEGATIVE ng/mL (ref <2.5)
Methadone: NEGATIVE ng/mL (ref <5.0)
Midazolam: NEGATIVE ng/mL (ref <0.50)
Nicotine Metabolite: NEGATIVE ng/mL (ref <5.0)
Nordiazepam: 5.7 ng/mL — ABNORMAL HIGH (ref <0.50)
Opiates: NEGATIVE ng/mL (ref <2.5)
Oxazepam: NEGATIVE ng/mL (ref <0.50)
Phencyclidine: NEGATIVE ng/mL (ref <10)
Tapentadol: NEGATIVE ng/mL (ref <5.0)
Temazepam: NEGATIVE ng/mL (ref <0.50)
Tramadol: NEGATIVE ng/mL (ref <5.0)
Triazolam: NEGATIVE ng/mL (ref <0.50)
Zolpidem: NEGATIVE ng/mL (ref <5.0)

## 2020-03-19 NOTE — Telephone Encounter (Signed)
Patient advised her prescriptions have been [facility] at her request and they will send all medications pre packed for her , "she will give them a try and see if she likes it"

## 2020-03-20 ENCOUNTER — Ambulatory Visit (HOSPITAL_BASED_OUTPATIENT_CLINIC_OR_DEPARTMENT_OTHER)
Admission: RE | Admit: 2020-03-20 | Discharge: 2020-03-20 | Disposition: A | Discharge status: Home / Self Care | Payer: Medicare HMO | Source: Ambulatory Visit | Attending: Family | Admitting: Family

## 2020-03-20 ENCOUNTER — Encounter (HOSPITAL_BASED_OUTPATIENT_CLINIC_OR_DEPARTMENT_OTHER): Payer: Self-pay

## 2020-03-20 ENCOUNTER — Other Ambulatory Visit: Payer: Self-pay

## 2020-03-20 DIAGNOSIS — R1013 Epigastric pain: Secondary | ICD-10-CM | POA: Insufficient documentation

## 2020-03-20 DIAGNOSIS — K5730 Diverticulosis of large intestine without perforation or abscess without bleeding: Secondary | ICD-10-CM | POA: Diagnosis not present

## 2020-03-20 DIAGNOSIS — Z90710 Acquired absence of both cervix and uterus: Secondary | ICD-10-CM | POA: Diagnosis not present

## 2020-03-20 DIAGNOSIS — I700 Atherosclerosis of aorta: Secondary | ICD-10-CM | POA: Diagnosis not present

## 2020-03-20 MED ORDER — IOHEXOL 300 MG/ML  SOLN: 100.0000 mL | Freq: Once | INTRAMUSCULAR | Status: DC | PRN

## 2020-03-20 MED ORDER — IOHEXOL 300 MG/ML  SOLN
100.0000 mL | Freq: Once | INTRAMUSCULAR | Status: AC | PRN
  Administered 2020-03-20: 16:00:00 80 mL via INTRAVENOUS

## 2020-03-21 ENCOUNTER — Telehealth: Payer: Self-pay | Admitting: Family

## 2020-03-21 DIAGNOSIS — R1013 Epigastric pain: Secondary | ICD-10-CM

## 2020-03-21 NOTE — Telephone Encounter (Signed)
Pt.notified

## 2020-03-21 NOTE — Telephone Encounter (Signed)
CT scan does not show cause for her abdominal pain. I would like to refer her to GI for further evaluation. Referral pended.

## 2020-03-25 NOTE — Telephone Encounter (Signed)
Veronica Wolf would like  for you to call her before the day is up if its possible [phone]

## 2020-03-26 ENCOUNTER — Other Ambulatory Visit: Payer: Self-pay | Admitting: Family

## 2020-03-27 NOTE — Telephone Encounter (Signed)
Requesting: diazepam 10mg  Contract: 03/14/2020 UDS: 03/14/2020 Last Visit: 03/14/2020 Next Visit: None Last Refill: 03/03/2020 #135 and 0RF  Please Advise

## 2020-03-27 NOTE — Telephone Encounter (Signed)
Contacted patient and she states she is feeling well, has no further questions.

## 2020-04-07 ENCOUNTER — Other Ambulatory Visit: Payer: Self-pay

## 2020-04-07 NOTE — Patient Outreach (Signed)
[facility]) Care Management  04/07/2020  Veronica Wolf 1940-06-02 [id number]   Telephone Assessment   Unsuccessful outreach attempt to patient. No answer after several rings and unable to leave message.   Plan: RN CM will make quarterly outreach attempt to patient within the month of Feb if no return call from patient.   Antionette Fairy, RN,BSN,CCM [facility] Care Management Telephonic Care Management Coordinator Direct Phone: [phone] Toll Free: [phone] Fax: [phone]

## 2020-04-08 ENCOUNTER — Telehealth: Payer: Self-pay | Admitting: Family

## 2020-04-08 NOTE — Telephone Encounter (Signed)
Patient called wants Dr.O'sullivan to call her,pt is having chest pain I call triage to follow up wit patient

## 2020-04-08 NOTE — Telephone Encounter (Addendum)
Triage nurse Veronica Wolf call about pt and stated that she was sent to her for triage because of chest pain.  After talking with patient she stated that it sounds like GERD symptoms and to be seen within 24 hours.  The nurse also mention that patient told her that her nebulizer was broken and possibly causing some of he symptoms. Advised her that she does need to be seen. She declined appointment for today.  She is scheduled for tomorrow (she may not come in).  Advised her that if chest pain gets worse, sob, and any of the other symptoms that she was given to watch for by nurse to call 911, go ER immediately or Urgent Care immediately.  Also advise for nebulizer to call her supplier, should have a number on machine. She will call

## 2020-04-09 ENCOUNTER — Encounter: Payer: Self-pay | Admitting: Family

## 2020-04-09 ENCOUNTER — Other Ambulatory Visit: Payer: Self-pay

## 2020-04-09 ENCOUNTER — Ambulatory Visit (INDEPENDENT_AMBULATORY_CARE_PROVIDER_SITE_OTHER): Payer: Medicare HMO | Admitting: Family

## 2020-04-09 ENCOUNTER — Telehealth: Payer: Self-pay

## 2020-04-09 DIAGNOSIS — R1013 Epigastric pain: Secondary | ICD-10-CM

## 2020-04-09 MED ORDER — SUCRALFATE 1 GM/10ML PO SUSP: 1.0000 g | Freq: Three times a day (TID) | ORAL | 1 refills | Status: DC

## 2020-04-09 MED ORDER — PANTOPRAZOLE SODIUM 40 MG PO TBEC: 40.0000 mg | DELAYED_RELEASE_TABLET | Freq: Two times a day (BID) | ORAL | 0 refills | Status: DC

## 2020-04-09 NOTE — Telephone Encounter (Signed)
Patient was seen today.

## 2020-04-09 NOTE — Progress Notes (Signed)
Subjective:    Patient ID: Veronica Wolf, female    DOB: Dec 16, 1940, 80 y.o.   MRN: [id number]  HPI  Patient is an 80 yr old female who presents today to discuss possible GERD symptoms. Reports some stiffness in her back. Some diarrhea.  Reports some epigastric discomfort.  Pain is not made worse at night.  Epigastric discomfort "wakes me up in the AM."  She has tried TUMS without improvement.  Reports good compliance with pantoprazole.  She admits to taking ibuprofen 2 tabs most days.   Review of Systems See HPI  Past Medical History:  Diagnosis Date  . Allergic rhinitis   . Anxiety   . CKD (chronic kidney disease) 04/04/2017  . COPD (chronic obstructive pulmonary disease) ([place])   . Depression   . GERD (gastroesophageal reflux disease)   . Hyperlipidemia   . Hypertension   . Peptic ulcer 01/04/2003  . Vertigo      Social History   Socioeconomic History  . Marital status: Widowed    Spouse name: Not on file  . Number of children: Not on file  . Years of education: Not on file  . Highest education level: Not on file  Occupational History  . Not on file  Tobacco Use  . Smoking status: Former Research scientist (life sciences)  . Smokeless tobacco: Never Used  Vaping Use  . Vaping Use: Never used  Substance and Sexual Activity  . Alcohol use: No    Comment: has previous hx of ETOH abuse, quit 2014  . Drug use: No  . Sexual activity: Not on file  Other Topics Concern  . 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 Worried About Charity fundraiser in the Last Year: Never true  . Ran Out of Food in the Last Year: Never true  Transportation Needs: No Transportation Needs  . Lack of Transportation (Medical): No  . Lack of Transportation (Non-Medical): No  Physical Activity: Not on file   Stress: Not on file  Social Connections: Not on file  Intimate Partner Violence: Not on file    Past Surgical History:  Procedure Laterality Date  . ABDOMINAL HYSTERECTOMY    . LEFT HEART CATH AND CORONARY ANGIOGRAPHY N/A 06/13/2017   Procedure: LEFT HEART CATH AND CORONARY ANGIOGRAPHY;  Surgeon: Leonie Man, MD;  Location: [facility];  Service: Cardiovascular;  Laterality: N/A;  . RIGHT/LEFT HEART CATH AND CORONARY ANGIOGRAPHY N/A 09/16/2017   Procedure: RIGHT/LEFT HEART CATH AND CORONARY ANGIOGRAPHY;  Surgeon: Martinique, Peter M, MD;  Location: [facility];  Service: Cardiovascular;  Laterality: N/A;  . ULTRASOUND GUIDANCE FOR VASCULAR ACCESS  09/16/2017   Procedure: Ultrasound Guidance For Vascular Access;  Surgeon: Martinique, Peter M, MD;  Location: [facility];  Service: Cardiovascular;;    Family History  Problem Relation Age of Onset  . Breast cancer Mother   . Stroke Father     Allergies  Allergen Reactions  . Ibuprofen Other (See Comments)    Pt has hx of peptic ulcer and diverticulitis.     Current Outpatient Medications on File Prior to Visit  Medication Sig Dispense Refill  . albuterol (PROVENTIL) (2.5 MG/3ML) 0.083% nebulizer solution USE 1 VIAL IN NEBULIZER EVERY 6 HOURS  AS NEEDED FOR WHEEZING AND FOR SHORTNESS OF BREATH 150 mL 0  . allopurinol (ZYLOPRIM) 100 MG tablet Take 2 tablets (200 mg total) by mouth daily. 180 tablet 1  . aspirin EC 81 MG tablet Take 1 tablet (81 mg total) by mouth daily. 90 tablet 3  . atorvastatin (LIPITOR) 40 MG tablet Take 1 tablet (40 mg total) by mouth daily at 6 PM. 90 tablet 1  . betamethasone dipropionate 0.05 % cream Apply topically 2 (two) times daily. 30 g 0  . cetirizine (ZYRTEC) 10 MG tablet Take 10 mg by mouth as needed for allergies.    Marland Kitchen colchicine 0.6 MG tablet Take 2 tabs by mouth now and then 1 tab in 1 hour (for gout) 3 tablet 0  . diazepam (VALIUM) 10 MG tablet TAKE 1 TABLET BY MOUTH EVERY 12 HOURS  AS NEEDED FOR ANXIETY 45 tablet 0  . dicyclomine (BENTYL) 10 MG capsule Take 1 capsule (10 mg total) by mouth in the morning, at noon, in the evening, and at bedtime. 360 capsule 1  . escitalopram (LEXAPRO) 20 MG tablet Take 1 tablet (20 mg total) by mouth daily. 90 tablet 1  . fluticasone (FLOVENT HFA) 110 MCG/ACT inhaler Inhale 2 puffs into the lungs 2 (two) times daily. 1 Inhaler 5  . levothyroxine (EUTHYROX) 25 MCG tablet TAKE 1 TABLET BY MOUTH ONCE DAILY BEFORE BREAKFAST 90 tablet 1  . lisinopril (ZESTRIL) 2.5 MG tablet Take 1 tablet (2.5 mg total) by mouth daily. 90 tablet 1  . meclizine (ANTIVERT) 25 MG tablet TAKE 1 TABLET BY MOUTH TWICE DAILY AS NEEDED FOR DIZZINESS 30 tablet 0  . metoprolol succinate (TOPROL-XL) 25 MG 24 hr tablet Take 0.5 tablets (12.5 mg total) by mouth daily. 45 tablet 0  . nystatin (NYSTATIN) powder APPLY  POWDER TOPICALLY TO AFFECTED AREA 2 TIMES DAILY 60 g 1  . pantoprazole (PROTONIX) 40 MG tablet Take 1 tablet (40 mg total) by mouth daily. 90 tablet 1  . potassium chloride SA (KLOR-CON) 20 MEQ tablet Take 1 tablet (20 mEq total) by mouth daily. 90 tablet 1  . VENTOLIN HFA 108 (90 Base) MCG/ACT inhaler as needed.    . nitroGLYCERIN (NITROSTAT) 0.4 MG SL tablet Place 1 tablet (0.4 mg total) under the tongue every 5 (five) minutes as needed. 11 tablet 5   No current facility-administered medications on file prior to visit.    BP 110/61 (BP Location: Right Arm, Patient Position: Sitting, Cuff Size: Large)   Pulse 76   Temp 97.6 F (36.4 C) (Oral)   Resp 16   Wt 196 lb (88.9 kg)   SpO2 97%   BMI 33.64 kg/m       Objective:   Physical Exam Constitutional:      Appearance: She is well-developed and well-nourished.  Cardiovascular:     Rate and Rhythm: Normal rate and regular rhythm.     Heart sounds: Normal heart sounds. No murmur heard.   Pulmonary:     Effort: Pulmonary effort is normal. No respiratory distress.     Breath sounds: Normal breath  sounds. No wheezing.  Abdominal:     General: Bowel sounds are normal.     Palpations: Abdomen is soft.     Tenderness: There is abdominal tenderness in the epigastric area.  Musculoskeletal:     Comments: Reproducible tenderness to palpation overlying the sternum.   Psychiatric:        Mood and Affect: Mood and affect normal.  Behavior: Behavior normal.        Thought Content: Thought content normal.        Judgment: Judgment normal.           Assessment & Plan:  Epigastric pain- She first complained about this 1 month ago.  We performed a CT abd/pelvis which showed normal gallbladder and diverticulosis.  She reports good compliance with protonix but admits to frequent NSAID use.  Advised pt as follows:  Stop ibuprofen, you may use tylenol as needed.  Please increase protonix to twice daily.  Add carafat 21ml PO QID.  Will also plan referral to GI, check CBC, CMET, Lipase.  This visit occurred during the SARS-CoV-2 public health emergency.  Safety protocols were in place, including screening questions prior to the visit, additional usage of staff PPE, and extensive cleaning of exam room while observing appropriate contact time as indicated for disinfecting solutions.

## 2020-04-09 NOTE — Patient Instructions (Addendum)
Stop ibuprofen, you may use tylenol as needed.  Please increase protonix to twice daily.

## 2020-04-09 NOTE — Telephone Encounter (Signed)
Nurse Assessment Nurse: D'Heur Ezzard Standing, RN, Adrienne Date/Time (Eastern Time): 04/08/2020 12:26:10 PM Confirm and document reason for call. If symptomatic, describe symptoms. ---Call transferred from answering service, caller is having chest pain that doesn't last that long. Normally starts in the morning. She has been taking Tums. She also has a nebulizer which is broken. Does the patient have any new or worsening symptoms? ---Yes Will a triage be completed? ---Yes Related visit to physician within the last 2 weeks? ---No Does the PT have any chronic conditions? (i.e. diabetes, asthma, this includes High risk factors for pregnancy, etc.) ---Yes List chronic conditions. ---gout, HTN, GERD, depression, anxiety Is this a behavioral health or substance abuse call? ---No Guidelines Guideline Title Affirmed Question Affirmed Notes Nurse Date/Time (Eastern Time) Chest Pain [1] Patient says chest pain feels exactly the same as previously diagnosed "heartburn" AND [2] describes burning in chest AND [3] accompanying sour taste in mouth D'Heur Ezzard Standing, RN, Hansel Starling 04/08/2020 12:33:29 PM Disp. Time Lamount Cohen Time) Disposition Final User 04/08/2020 12:07:21 PM Send to Urgent Queue Jory Ee 04/08/2020 12:11:18 PM Attempt made - no message left D'Heur Ezzard Standing, RN, Hansel Starling PLEASE NOTE: All timestamps contained within this report are represented as [place] Standard Time. CONFIDENTIALTY NOTICE: This fax transmission is intended only for the addressee. It contains information that is legally privileged, confidential or otherwise protected from use or disclosure. If you are not the intended recipient, you are strictly prohibited from reviewing, disclosing, copying using or disseminating any of this information or taking any action in reliance on or regarding this information. If you have received this fax in error, please notify us immediately by telephone so that we can arrange for its return to  [place]. Phone: [phone], Toll-Free: [phone], Fax: [phone] Page: 2 of 2 Call Id: [id number] 04/08/2020 12:43:17 PM Call PCP within 24 Hours Yes D'Heur Ezzard Standing, RN, Hansel Starling Caller Disagree/Comply Comply Caller Understands Yes PreDisposition Call Doctor Care Advice Given Per Guideline CALL PCP WITHIN 24 HOURS: * You need to discuss this with your doctor (or NP/PA) within the next 24 hours. * IF OFFICE WILL BE OPEN: Call the office when it opens tomorrow morning. GENERAL CARE ADVICE FOR GASTRIC REFLUX: * Avoid lying down for 3 or more hours after eating * Avoid or reduce caffeine (coffee, tea, colas) * Stop smoking FOOD: * Eat smaller meals and avoid snacks right before sleeping. * Avoid the following foods which tend to aggravate heartburn and stomach problems: fatty/greasy foods, spicy foods, caffeinated beverages, and chocolate. CALL BACK IF: * Difficulty breathing or unusual sweating occurs * Chest pain increases in frequency, duration or severity * Chest pain lasts over 5 minutes * You become worse CARE ADVICE given per Chest Pain (Adult) guideline. * Try taking an antacid (e.g., Mylanta, Maalox) 1 hour after meals and before bedtime. ANTACIDS: Comments User: Hansel Starling, D'Heur Ezzard Standing, RN Date/Time Lamount Cohen Time): 04/08/2020 12:28:54 PM Caller states her chest pain "feels like indigestion." User: Hansel Starling, D'Heur Ezzard Standing, RN Date/Time (Eastern Time): 04/08/2020 12:34:57 PM Caller states her pain is in the middle of her stomach up into her chest, she describes it as a "sickening pain". User: Hansel Starling, D'Heur Ezzard Standing, RN Date/Time Lamount Cohen Time): 04/08/2020 12:35:28 PM Caller states her MD told her to take TUMS when she has this type of pain,. User: Hansel Starling, D'Heur Ezzard Standing, RN Date/Time Lamount Cohen Time): 04/08/2020 12:36:02 PM Caller states she has COPD User: Hansel Starling, D'Heur Ezzard Standing, RN Date/Time Lamount Cohen Time): 04/08/2020 12:53:38 PM Called office to ask if they would be able to help with  patient's  nebulizer. They will talk with caller about contacting machine supplier. They will also call her to schedule an appointment within 24 hrs  Pt has appt this morning w/ PCP

## 2020-04-10 ENCOUNTER — Ambulatory Visit: Payer: Self-pay

## 2020-04-11 ENCOUNTER — Encounter: Payer: Self-pay | Admitting: Gastroenterology

## 2020-04-11 ENCOUNTER — Ambulatory Visit (INDEPENDENT_AMBULATORY_CARE_PROVIDER_SITE_OTHER): Payer: Medicare HMO | Admitting: Gastroenterology

## 2020-04-11 VITALS — BP 122/78 | HR 88 | Ht 62.6 in | Wt 198.0 lb

## 2020-04-11 DIAGNOSIS — K219 Gastro-esophageal reflux disease without esophagitis: Secondary | ICD-10-CM

## 2020-04-11 DIAGNOSIS — I2510 Atherosclerotic heart disease of native coronary artery without angina pectoris: Secondary | ICD-10-CM

## 2020-04-11 DIAGNOSIS — J449 Chronic obstructive pulmonary disease, unspecified: Secondary | ICD-10-CM | POA: Diagnosis not present

## 2020-04-11 DIAGNOSIS — Z860100 Personal history of colon polyps, unspecified: Secondary | ICD-10-CM

## 2020-04-11 DIAGNOSIS — J4489 Other specified chronic obstructive pulmonary disease: Secondary | ICD-10-CM

## 2020-04-11 DIAGNOSIS — R1013 Epigastric pain: Secondary | ICD-10-CM

## 2020-04-11 NOTE — Patient Instructions (Signed)
If you are age 81 or older, your body mass index should be between 23-30. Your Body mass index is 35.53 kg/m. If this is out of the aforementioned range listed, please consider follow up with your Primary Care Provider.  If you are age 51 or younger, your body mass index should be between 19-25. Your Body mass index is 35.53 kg/m. If this is out of the aformentioned range listed, please consider follow up with your Primary Care Provider.   Due to recent changes in healthcare laws, you may see the results of your imaging and laboratory studies on MyChart before your provider has had a chance to review them.  We understand that in some cases there may be results that are confusing or concerning to you. Not all laboratory results come back in the same time frame and the provider may be waiting for multiple results in order to interpret others.  Please give [place] 48 hours in order for your provider to thoroughly review all the results before contacting the office for clarification of your results.   Thank you for choosing me and [facility].  Vito Cirigliano, D.O.

## 2020-04-11 NOTE — Progress Notes (Signed)
Chief Complaint: GERD, epigastric pain   Referring Provider:     [name], NP   HPI:     Veronica Wolf is a 80 y.o. female with a history of COPD, CKD, CAD with cardiac cath 2019, depression, HTN, HLD, allergic rhinitis, anxiety, referred to the [facility]ology [facility] for evaluation of GERD and epigastric pain. She also has a reported history peptic ulcer disease.  Reflux symptoms largely well controlled with pantoprazole 40 mg/day.  Was seen by her PCM on 03/14/2020 for epigastric pain; EKG at baseline and labs unremarkable (liver enzymes, lipase, TSH). CT abdomen/pelvis with sigmoid diverticulosis and otherwise unremarkable GI tract. Was seen in follow-up by her PCM earlier this week. Was told to stop taking ibuprofen (was taking NSAIDs frequently), increased Protonix to bid, and added Carafate (patient not taking it), and referred to GI.  Today, she states the MEG pain continues to occur intermittently. Has had black stools over last 2 weeks, but started taking Pepto Bismol over that same time. No radiation, n/v,hematochezia. Occurs at random; not a/w PO intake or activity. Improves with Tums. Has stopped taking Ibuprofen (was taking a couple times/day). Not sure if she has ever had an EGD in the past, but reports a hx of PUD.  Last colonoscopy was several years per patient and n/f polyps. Not sure where this was done though (?Dr. Kinnie Scales).    Past Medical History:  Diagnosis Date  . Allergic rhinitis   . Anxiety   . CKD (chronic kidney disease) 04/04/2017  . COPD (chronic obstructive pulmonary disease) ([facility])   . Depression   . GERD (gastroesophageal reflux disease)   . Hyperlipidemia   . Hypertension   . Peptic ulcer 01/04/2003  . Vertigo      Past Surgical History:  Procedure Laterality Date  . ABDOMINAL HYSTERECTOMY    . LEFT HEART CATH AND CORONARY ANGIOGRAPHY N/A 06/13/2017   Procedure: LEFT HEART CATH AND CORONARY ANGIOGRAPHY;  Surgeon:  Marykay Lex, MD;  Location: [facility];  Service: Cardiovascular;  Laterality: N/A;  . RIGHT/LEFT HEART CATH AND CORONARY ANGIOGRAPHY N/A 09/16/2017   Procedure: RIGHT/LEFT HEART CATH AND CORONARY ANGIOGRAPHY;  Surgeon: Swaziland, Peter M, MD;  Location: [facility];  Service: Cardiovascular;  Laterality: N/A;  . ULTRASOUND GUIDANCE FOR VASCULAR ACCESS  09/16/2017   Procedure: Ultrasound Guidance For Vascular Access;  Surgeon: Swaziland, Peter M, MD;  Location: [facility];  Service: Cardiovascular;;   Family History  Problem Relation Age of Onset  . Breast cancer Mother   . Stroke Father   . Stomach cancer Neg Hx   . Colon cancer Neg Hx   . Pancreatic cancer Neg Hx   . Esophageal cancer Neg Hx    Social History   Tobacco Use  . Smoking status: Former Games developer  . Smokeless tobacco: Never Used  Vaping Use  . Vaping Use: Never used  Substance Use Topics  . Alcohol use: No    Comment: has previous hx of ETOH abuse, quit 2014  . Drug use: No   Current Outpatient Medications  Medication Sig Dispense Refill  . albuterol (PROVENTIL) (2.5 MG/3ML) 0.083% nebulizer solution USE 1 VIAL IN NEBULIZER EVERY 6 HOURS AS NEEDED FOR WHEEZING AND FOR SHORTNESS OF BREATH 150 mL 0  . allopurinol (ZYLOPRIM) 100 MG tablet Take 2 tablets (200 mg total) by mouth daily. 180 tablet 1  . aspirin EC 81  MG tablet Take 1 tablet (81 mg total) by mouth daily. 90 tablet 3  . atorvastatin (LIPITOR) 40 MG tablet Take 1 tablet (40 mg total) by mouth daily at 6 PM. 90 tablet 1  . betamethasone dipropionate 0.05 % cream Apply topically 2 (two) times daily. 30 g 0  . cetirizine (ZYRTEC) 10 MG tablet Take 10 mg by mouth as needed for allergies.    Marland Kitchen colchicine 0.6 MG tablet Take 2 tabs by mouth now and then 1 tab in 1 hour (for gout) 3 tablet 0  . diazepam (VALIUM) 10 MG tablet TAKE 1 TABLET BY MOUTH EVERY 12 HOURS AS NEEDED FOR ANXIETY 45 tablet 0  . dicyclomine (BENTYL) 10 MG capsule Take 1 capsule  (10 mg total) by mouth in the morning, at noon, in the evening, and at bedtime. 360 capsule 1  . escitalopram (LEXAPRO) 20 MG tablet Take 1 tablet (20 mg total) by mouth daily. 90 tablet 1  . levothyroxine (EUTHYROX) 25 MCG tablet TAKE 1 TABLET BY MOUTH ONCE DAILY BEFORE BREAKFAST 90 tablet 1  . lisinopril (ZESTRIL) 2.5 MG tablet Take 1 tablet (2.5 mg total) by mouth daily. 90 tablet 1  . meclizine (ANTIVERT) 25 MG tablet TAKE 1 TABLET BY MOUTH TWICE DAILY AS NEEDED FOR DIZZINESS 30 tablet 0  . metoprolol succinate (TOPROL-XL) 25 MG 24 hr tablet Take 0.5 tablets (12.5 mg total) by mouth daily. 45 tablet 0  . nystatin (NYSTATIN) powder APPLY  POWDER TOPICALLY TO AFFECTED AREA 2 TIMES DAILY 60 g 1  . pantoprazole (PROTONIX) 40 MG tablet Take 1 tablet (40 mg total) by mouth 2 (two) times daily. 180 tablet 0  . potassium chloride SA (KLOR-CON) 20 MEQ tablet Take 1 tablet (20 mEq total) by mouth daily. 90 tablet 1  . VENTOLIN HFA 108 (90 Base) MCG/ACT inhaler as needed.    . nitroGLYCERIN (NITROSTAT) 0.4 MG SL tablet Place 1 tablet (0.4 mg total) under the tongue every 5 (five) minutes as needed. 11 tablet 5   No current facility-administered medications for this visit.   Allergies  Allergen Reactions  . Ibuprofen Other (See Comments)    Pt has hx of peptic ulcer and diverticulitis.      Review of Systems: All systems reviewed and negative except where noted in HPI.     Physical Exam:    Wt Readings from Last 3 Encounters:  04/11/20 198 lb (89.8 kg)  04/09/20 196 lb (88.9 kg)  03/14/20 191 lb (86.6 kg)    BP 122/78   Pulse 88   Ht 5' 2.6" (1.59 m)   Wt 198 lb (89.8 kg)   BMI 35.53 kg/m  Constitutional:  Pleasant, in no acute distress. Psychiatric: Normal mood and affect. Behavior is normal. EENT: Pupils normal.  Conjunctivae are normal. No scleral icterus. Neck supple. No cervical LAD. Cardiovascular: Normal rate, regular rhythm. No edema Pulmonary/chest: Effort normal and  breath sounds normal. No wheezing, rales or rhonchi. Abdominal: Soft, nondistended, nontender. Bowel sounds active throughout. There are no masses palpable. No hepatomegaly. Neurological: Alert and oriented to person place and time. Skin: Skin is warm and dry. No rashes noted.   ASSESSMENT AND PLAN;   1) Epigastric pain - EGD to evaluate for PUD, gastritis - Continue high-dose PPI as already prescribed - Start Carafate as prescribed.  If liquid not covered/constipated, okay to change to tablets - Continue avoiding all NSAIDs  2) GERD - Well-controlled on PPI - Continue antireflux lifestyle/dietary modifications - Evaluate for  erosive esophagitis, LES laxity, hiatal hernia, etc. ABG as above  3) History of colon polyps - Will obtain records from previous GI for review  4) History of CAD - Okay to continue ASA 81 mg in the perioperative period  5) History of COPD - To bring rescue inhalers with her on procedure date  The indications, risks, and benefits of EGD were explained to the patient in detail. Risks include but are not limited to bleeding, perforation, adverse reaction to medications, and cardiopulmonary compromise. Sequelae include but are not limited to the possibility of surgery, hositalization, and mortality. The patient verbalized understanding and wished to proceed. All questions answered, referred to scheduler. Further recommendations pending results of the exam.    Lavena Bullion, DO, FACG  04/11/2020, 2:57 PM   Debbrah Alar, NP

## 2020-04-16 ENCOUNTER — Telehealth: Payer: Self-pay | Admitting: Family

## 2020-04-16 NOTE — Telephone Encounter (Signed)
Medication: nystatin (NYSTATIN) powder [[id number]]     Has the patient contacted their pharmacy? NO (If no, request that the patient contact the pharmacy for the refill.) (If yes, when and what did the pharmacy advise?)    Preferred Pharmacy (with phone number or street name):  [facility] [address] [place], [place] - [address]  [address], [place] [address]  Phone:  [phone] Fax:  [phone]      Agent: Please be advised that RX refills may take up to 3 business days. We ask that you follow-up with your pharmacy.

## 2020-04-17 ENCOUNTER — Telehealth: Payer: Self-pay

## 2020-04-17 MED ORDER — NYSTATIN 100000 UNIT/GM EX POWD: CUTANEOUS | 1 refills | Status: DC

## 2020-04-17 NOTE — Telephone Encounter (Signed)
Rx sent 

## 2020-04-17 NOTE — Telephone Encounter (Signed)
PA initiated via Covermymeds; KEY: [id number]. PA approved.   PA Case: [id number], Status: Approved, Coverage Starts on: 04/05/2020 12:00:00 AM, Coverage Ends on: 04/04/2021 12:00:00 AM. Questions? Contact [phone]

## 2020-04-24 ENCOUNTER — Ambulatory Visit (AMBULATORY_SURGERY_CENTER): Payer: Medicare HMO | Admitting: Gastroenterology

## 2020-04-24 ENCOUNTER — Other Ambulatory Visit: Payer: Self-pay

## 2020-04-24 ENCOUNTER — Encounter: Payer: Self-pay | Admitting: Gastroenterology

## 2020-04-24 VITALS — BP 162/57 | HR 74 | Temp 96.6°F | Resp 22 | Ht 62.0 in | Wt 198.0 lb

## 2020-04-24 DIAGNOSIS — K219 Gastro-esophageal reflux disease without esophagitis: Secondary | ICD-10-CM | POA: Diagnosis not present

## 2020-04-24 DIAGNOSIS — K222 Esophageal obstruction: Secondary | ICD-10-CM

## 2020-04-24 DIAGNOSIS — R1013 Epigastric pain: Secondary | ICD-10-CM | POA: Diagnosis not present

## 2020-04-24 MED ORDER — SUCRALFATE 1 GM/10ML PO SUSP: 1.0000 g | Freq: Four times a day (QID) | ORAL | 1 refills | Status: DC

## 2020-04-24 MED ORDER — SODIUM CHLORIDE 0.9 % IV SOLN: 500.0000 mL | INTRAVENOUS | Status: DC

## 2020-04-24 NOTE — Progress Notes (Signed)
Prior to procedure, patient endorses epigastric pain for the last 2 days.  Pain has been essentially continuous and nonradiating.  No associated shortness of breath or diaphoresis.  Pain similar in location and quality to previous, but more severe.  Tried taking OTC Pepto-Bismol without any relief.  Due to her cardiac history, obtained EKG in the preoperative unit.  EKG reviewed and compared to previous studies.  Largely unchanged from her recent EKG from 03/14/2020 along with comparison on 06/12/2018.  She is otherwise hemodynamically stable. Cardiac: Regular rate and rhythm Pulmonary: CTA bilaterally, no wheezes GI: TTP in mid epigastrium without peritoneal signs. +BS Extremities: No c/c/e  Based on unchanged EKG, duration of symptoms, location/quality of symptoms, feel this is less likely to be ACS.  Discussed the possibility of atypical cardiac symptoms, particularly in elderly female with comorbidities and prior cardiac history.  Discussed the risks of the procedure with the patient at length again today, along with discussion with her daughter. Discussed risks of sedation, and they both consent to proceed with the procedure.  Separately, I discussed possible signs of early memory impairment with the daughter.  She confirms that she has been forgetful over the last couple of months.  I strongly recommended that she seek further medical attention with her primary care, and she understands and agrees.  Patient is otherwise able to recall why she is here today, the details surrounding her GI symptoms, her cardiac history, and details regarding today's EGD.    Doristine Locks, DO, [facility]

## 2020-04-24 NOTE — Progress Notes (Addendum)
1335:  Pt c/o sever pain in epigastric area that goes upward through breast bone. This pain started yesterday and is much worse today. She first states that pepto bismal helped but now declines any change in when taking it.CRNA Allyson Sabal and Dr. Santa Lighter at bedside and requested EKG.  1337: writhing in pain.  EKG in progress.  1344: pt state she feels slightly better lying with only slight EOB., rating it a "7" now. Dr. Barron Alvine at bedside looking at EKG results.  No change since last EKG. 1400:  Dr. Barron Alvine informed pts daughter jill of episode and confusion. Daughter concurs with confusion. Will proceed with EGD. 1410: pt up to bathroom comfortably and on her way to procedure room.

## 2020-04-24 NOTE — Patient Instructions (Signed)
YOU HAD AN ENDOSCOPIC PROCEDURE TODAY AT THE [facility]:   Refer to the procedure report that was given to you for any specific questions about what was found during the examination.  If the procedure report does not answer your questions, please call your gastroenterologist to clarify.  If you requested that your care partner not be given the details of your procedure findings, then the procedure report has been included in a sealed envelope for you to review at your convenience later.  YOU SHOULD EXPECT: Some feelings of bloating in the abdomen. Passage of more gas than usual.  Walking can help get rid of the air that was put into your GI tract during the procedure and reduce the bloating. If you had a lower endoscopy (such as a colonoscopy or flexible sigmoidoscopy) you may notice spotting of blood in your stool or on the toilet paper. If you underwent a bowel prep for your procedure, you may not have a normal bowel movement for a few days.  Please Note:  You might notice some irritation and congestion in your nose or some drainage.  This is from the oxygen used during your procedure.  There is no need for concern and it should clear up in a day or so.  SYMPTOMS TO REPORT IMMEDIATELY:   Following lower endoscopy (colonoscopy or flexible sigmoidoscopy):  Excessive amounts of blood in the stool  Significant tenderness or worsening of abdominal pains  Swelling of the abdomen that is new, acute  Fever of 100F or higher   Following upper endoscopy (EGD)  Vomiting of blood or coffee ground material  New chest pain or pain under the shoulder blades  Painful or persistently difficult swallowing  New shortness of breath  Fever of 100F or higher  Black, tarry-looking stools  For urgent or emergent issues, a gastroenterologist can be reached at any hour by calling [phone]. Do not use MyChart messaging for urgent concerns.    DIET:  We do recommend a small meal at first, but  then you may proceed to your regular diet.  Drink plenty of fluids but you should avoid alcoholic beverages for 24 hours.  ACTIVITY:  You should plan to take it easy for the rest of today and you should NOT DRIVE or use heavy machinery until tomorrow (because of the sedation medicines used during the test).    FOLLOW UP: Our staff will call the number listed on your records 48-72 hours following your procedure to check on you and address any questions or concerns that you may have regarding the information given to you following your procedure. If we do not reach you, we will leave a message.  We will attempt to reach you two times.  During this call, we will ask if you have developed any symptoms of COVID 19. If you develop any symptoms (ie: fever, flu-like symptoms, shortness of breath, cough etc.) before then, please call [phone].  If you test positive for Covid 19 in the 2 weeks post procedure, please call and report this information to us.    If any biopsies were taken you will be contacted by phone or by letter within the next 1-3 weeks.  Please call us at [phone] if you have not heard about the biopsies in 3 weeks.    SIGNATURES/CONFIDENTIALITY: You and/or your care partner have signed paperwork which will be entered into your electronic medical record.  These signatures attest to the fact that that the information above on   your After Visit Summary has been reviewed and is understood.  Full responsibility of the confidentiality of this discharge information lies with you and/or your care-partner. 

## 2020-04-24 NOTE — Op Note (Signed)
[facility] Patient Name: Veronica Wolf Procedure Date: 04/24/2020 1:20 PM MRN: [id number] Endoscopist: Doristine Locks , MD Age: 80 Referring MD:  Date of Birth: 1940/08/19 Gender: Female Account #: [id number] Procedure:                Upper GI endoscopy Indications:              Epigastric abdominal pain, Suspected esophageal                            reflux Medicines:                Monitored Anesthesia Care Procedure:                Pre-Anesthesia Assessment:                           - Prior to the procedure, a History and Physical                            was performed, and patient medications and                            allergies were reviewed. The patient's tolerance of                            previous anesthesia was also reviewed. The risks                            and benefits of the procedure and the sedation                            options and risks were discussed with the patient.                            All questions were answered, and informed consent                            was obtained. Prior Anticoagulants: The patient has                            taken no previous anticoagulant or antiplatelet                            agents except for aspirin. ASA Grade Assessment:                            III - A patient with severe systemic disease. After                            reviewing the risks and benefits, the patient was                            deemed in satisfactory condition to undergo the  procedure.                           After obtaining informed consent, the endoscope was                            passed under direct vision. Throughout the                            procedure, the patient's blood pressure, pulse, and                            oxygen saturations were monitored continuously. The                            Endoscope was introduced through the mouth, with                             the intention of advancing to the esophagus. The                            scope was advanced to the cricopharyngeal esophagus                            before the procedure was aborted. Medications were                            given. The upper GI endoscopy was technically                            difficult and complex due to abnormal anatomy. The                            patient tolerated the procedure well. Scope In: Scope Out: Findings:                 One severe stenosis was found at the                            cricopharyngeus. The stenosis was not traversed.                            Attempted to reposition patient and despite                            multiple attempts, could not traverse nor get a                            better visualization of the obstructive anatomy. Complications:            No immediate complications. Estimated Blood Loss:     Estimated blood loss: none. 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 Query presence of Zenker's                            diverticulum vs severe web. This was not                            traversable.                           - No specimens collected. Recommendation:           - Patient has a contact number available for                            emergencies. The signs and symptoms of potential                            delayed complications were discussed with the                            patient. Return to normal activities tomorrow.                            Written discharge instructions were provided to the                            patient.                           - Soft diet.                           - Continue present medications.                           - Perform an barium esophagram at appointment to be                            scheduled.                           - Return to GI clinic after studies are complete.                            - Use sucralfate suspension 1 gram PO QID for                            treatment of suspected Peptic Ulcer Disease.                           - Avoid all NSAIDs.                           - Depending on above results, may need to repeat                            EGD at the hospital  with ultraslim scope.                           - Results discussed with patient and her daughter                            at bedside. Doristine LocksVito Daryan Cagley, MD 04/24/2020 2:33:13 PM

## 2020-04-24 NOTE — Progress Notes (Signed)
To PACU, VSS. Report to Rn.tb 

## 2020-04-25 ENCOUNTER — Encounter: Payer: Medicare HMO | Admitting: Gastroenterology

## 2020-04-25 ENCOUNTER — Telehealth: Payer: Self-pay | Admitting: Gastroenterology

## 2020-04-25 NOTE — Telephone Encounter (Signed)
Pt states that carafate is over $90, she wants to know if there is a more affordable option.

## 2020-04-27 ENCOUNTER — Other Ambulatory Visit: Payer: Self-pay | Admitting: Family

## 2020-04-27 DIAGNOSIS — R42 Dizziness and giddiness: Secondary | ICD-10-CM

## 2020-04-28 ENCOUNTER — Telehealth: Payer: Self-pay | Admitting: *Deleted

## 2020-04-28 NOTE — Telephone Encounter (Signed)
Requesting: diazepam 10mg  Contract: 03/14/2020 UDS: 03/14/2020 Last Visit: 03/14/2020 Next Visit: None Last Refill: 03/27/2020 #45 and 0RF  Please Advise

## 2020-04-28 NOTE — Telephone Encounter (Signed)
Attempted f/u phone call. No answer. Left message. °

## 2020-04-28 NOTE — Telephone Encounter (Signed)
Attempted 2nd f/u phone call. No answer. Left message.  °

## 2020-04-30 MED ORDER — SUCRALFATE 1 G PO TABS: 1.0000 g | ORAL_TABLET | Freq: Four times a day (QID) | ORAL | 0 refills | Status: DC

## 2020-04-30 NOTE — Telephone Encounter (Signed)
I have sent prescription to patient's pharmacy.  

## 2020-04-30 NOTE — Telephone Encounter (Signed)
Would you like to change this medication to something else?  

## 2020-04-30 NOTE — Telephone Encounter (Signed)
Can try sending in Rx for sucralfate tablets, and can break tablet in 1/2 and let dissolve completely in water prior to drinking.

## 2020-05-02 ENCOUNTER — Telehealth: Payer: Self-pay | Admitting: Family

## 2020-05-02 NOTE — Telephone Encounter (Signed)
Patient's daughter Noreene Larsson called in reference to patient nebulizer machine being broken. Per patient she does not remember where machine is from, no sticker on the machine indicating the company name. Patient needs a new machine or her machine fixed.    Please Advise

## 2020-05-02 NOTE — Telephone Encounter (Signed)
Patient daughter called back and states patient found the information to medical supply office.  [employer]  Fax Number [phone]  Patient states she needs an order for a nebulizer kit adult. (patient has a broken piece on her machine).

## 2020-05-03 ENCOUNTER — Other Ambulatory Visit: Payer: Self-pay | Admitting: Family

## 2020-05-05 ENCOUNTER — Telehealth: Payer: Self-pay

## 2020-05-05 NOTE — Telephone Encounter (Signed)
Called daughter to let her know that order was faxed to the fax number provided but got no answer and no answering machine.

## 2020-05-05 NOTE — Telephone Encounter (Signed)
Talked to patient's daughter and notified her that rx was faxed earlier this morning, she reports she was called from the medical supply and she already received the nebulizer.

## 2020-05-05 NOTE — Telephone Encounter (Signed)
Caller Name [name] Relationship To Patient Daughter Return Phone Number [phone] (Secondary) Chief Complaint Prescription Refill or Medication Request (non symptomatic) Reason for Call Symptomatic / Request for Health Information Initial Comment Caller states left message yesterday. Is calling inquiring about any progress. Patient's prescription needs a kit. Nebulizer machine has a missing part, via kit. It has been a few weeks now. Fax number to send the prescription to is: [phone] Medical Supply Translation No Disp. Time Eilene Ghazi Time) Disposition Final User 05/03/2020 1:30:06 PM Attempt made - no message left Eldridge Dace, [place] 05/03/2020 1:47:21 PM FINAL ATTEMPT MADE - no message left Yes Rosana Hoes, RN, Candace Comments User: Marina Goodell, RN Date/Time Eilene Ghazi Time): 05/03/2020 1:30:55 PM Voicemail is full, unable to leave message

## 2020-05-07 ENCOUNTER — Other Ambulatory Visit: Payer: Self-pay | Admitting: Gastroenterology

## 2020-05-07 ENCOUNTER — Telehealth: Payer: Self-pay | Admitting: Gastroenterology

## 2020-05-07 ENCOUNTER — Ambulatory Visit: Payer: Medicare HMO | Admitting: Family

## 2020-05-07 DIAGNOSIS — K219 Gastro-esophageal reflux disease without esophagitis: Secondary | ICD-10-CM

## 2020-05-07 DIAGNOSIS — K222 Esophageal obstruction: Secondary | ICD-10-CM

## 2020-05-07 NOTE — Telephone Encounter (Signed)
Pt called to ask you to pls do not make any appts to check "her throat." Pt will contact you in few weeks to schedule it.

## 2020-05-07 NOTE — Telephone Encounter (Signed)
Pt is requesting a call back from a nurse, pt states she is still experiencing chest pain, pt states the CARAFATE is not helping her.

## 2020-05-07 NOTE — Telephone Encounter (Signed)
Spoke to patient this afternoon who reports continued chest pain. She recently had an EGD 04/24/20.( No biopsies noted in chart) and started on Carafate QID in addition to Pantoprazole 40 mg twice daily. Her chest pain started last night for a few hours then subsided. She reports eating 5 chocolate candies after dinner with a cup of coffee. She was advised to avoid those types of food triggers,and avoid Ibuprofen.She is scheduled for a barium esophagram next week. Patient was advised to call the office if her CP returns. She will have a follow up once   results are back.

## 2020-05-08 ENCOUNTER — Telehealth: Payer: Medicare HMO

## 2020-05-12 ENCOUNTER — Telehealth: Payer: Self-pay | Admitting: Gastroenterology

## 2020-05-12 ENCOUNTER — Other Ambulatory Visit: Payer: Self-pay | Admitting: Gastroenterology

## 2020-05-12 MED ORDER — FAMOTIDINE 20 MG PO TABS: 20.0000 mg | ORAL_TABLET | Freq: Two times a day (BID) | ORAL | 3 refills | Status: DC

## 2020-05-12 NOTE — Telephone Encounter (Signed)
Spoke to patient. Medication sent to her [facility] pharmacy

## 2020-05-12 NOTE — Progress Notes (Signed)
pe

## 2020-05-12 NOTE — Telephone Encounter (Signed)
Pt is requesting a call back from a nurse in regards to her CARAFATE. Pt states the medication is not helping her, pt states she is still experiencing chest pain and diarrhea.

## 2020-05-12 NOTE — Telephone Encounter (Signed)
Patient continues to complain of GERD,CP. She takes Carafate QID and Protonix 40 mg BID. She is scheduled for an esophagram tomorrow at [facility] but was wondering if there was anything else she can try

## 2020-05-12 NOTE — Telephone Encounter (Signed)
Can add Pepcid 20 mg bid to see if this improves her symptoms.  Can also plan to put her on for EGD at [facility] with miniscope with me pending results of esophagram.

## 2020-05-13 ENCOUNTER — Ambulatory Visit (HOSPITAL_COMMUNITY)
Admission: RE | Admit: 2020-05-13 | Discharge: 2020-05-13 | Disposition: A | Discharge status: Home / Self Care | Payer: Medicare Other | Source: Ambulatory Visit | Attending: Gastroenterology | Admitting: Gastroenterology

## 2020-05-13 ENCOUNTER — Other Ambulatory Visit: Payer: Self-pay

## 2020-05-13 DIAGNOSIS — K2289 Other specified disease of esophagus: Secondary | ICD-10-CM | POA: Diagnosis not present

## 2020-05-13 DIAGNOSIS — K222 Esophageal obstruction: Secondary | ICD-10-CM | POA: Insufficient documentation

## 2020-05-13 DIAGNOSIS — K219 Gastro-esophageal reflux disease without esophagitis: Secondary | ICD-10-CM | POA: Diagnosis not present

## 2020-05-15 ENCOUNTER — Other Ambulatory Visit: Payer: Self-pay | Admitting: Gastroenterology

## 2020-05-15 ENCOUNTER — Other Ambulatory Visit: Payer: Self-pay

## 2020-05-15 DIAGNOSIS — R1310 Dysphagia, unspecified: Secondary | ICD-10-CM

## 2020-05-30 ENCOUNTER — Other Ambulatory Visit: Payer: Self-pay | Admitting: Family

## 2020-05-30 NOTE — Telephone Encounter (Signed)
Requesting: diazepam 10mg  Contract: 06/28/2019 UDS: 03/14/2020 Last Visit: 04/09/2020 Next Visit: None Last Refill: 04/29/2020 #45 and 0RF  Please Advise

## 2020-06-03 ENCOUNTER — Ambulatory Visit (AMBULATORY_SURGERY_CENTER): Payer: Self-pay

## 2020-06-03 ENCOUNTER — Other Ambulatory Visit: Payer: Self-pay

## 2020-06-03 VITALS — Ht 62.0 in | Wt 199.0 lb

## 2020-06-03 DIAGNOSIS — R1310 Dysphagia, unspecified: Secondary | ICD-10-CM

## 2020-06-03 DIAGNOSIS — K222 Esophageal obstruction: Secondary | ICD-10-CM

## 2020-06-03 NOTE — Progress Notes (Signed)
No egg or soy allergy known to patient  No issues with past sedation with any surgeries or procedures No intubation problems in the past  No FH of Malignant Hyperthermia No diet pills per patient No home 02 use per patient  No blood thinners per patient  Pt denies issues with constipation  No A fib or A flutter  COVID 19 guidelines implemented in PV today with Pt and RN  Pt denies loose or missing teeth, dentures, partials, dental implants, capped or bonded teeth; Coupon given to pt in PV today , Code to Pharmacy and  NO PA's for preps discussed with pt in PV today  Discussed with pt there will be an out-of-pocket cost for prep and that varies from $0 to 70 dollars  Due to the COVID-19 pandemic we are asking patients to follow certain guidelines.  Pt aware of COVID protocols and [facility] guidelines   

## 2020-06-04 ENCOUNTER — Other Ambulatory Visit: Payer: Self-pay

## 2020-06-04 NOTE — Patient Outreach (Addendum)
[facility]) Care Management  06/04/2020  Veronica Wolf 1940/10/07 [id number]   Telephone Assessment   Outreach attempt # 1 to patient. Spoke briefly with patient who reported she was resting at present. Patient has switched to [facility] Medicare. Advised patient that case would be transferred to another RN and she voiced understanding.     Plan: RN CM will send case to [facility] RN Health Coach for reassignment.  Antionette Fairy, RN,BSN,CCM [facility] Care Management Telephonic Care Management Coordinator Direct Phone: [phone] Toll Free: [phone] Fax: [phone]

## 2020-06-05 ENCOUNTER — Other Ambulatory Visit: Payer: Self-pay

## 2020-06-05 ENCOUNTER — Other Ambulatory Visit: Payer: Self-pay | Admitting: *Deleted

## 2020-06-06 ENCOUNTER — Ambulatory Visit: Payer: Self-pay

## 2020-06-09 ENCOUNTER — Other Ambulatory Visit (HOSPITAL_COMMUNITY)
Admission: RE | Admit: 2020-06-09 | Discharge: 2020-06-09 | Disposition: A | Discharge status: Home / Self Care | Payer: Medicare HMO | Source: Ambulatory Visit | Attending: Gastroenterology | Admitting: Gastroenterology

## 2020-06-09 DIAGNOSIS — Z20822 Contact with and (suspected) exposure to covid-19: Secondary | ICD-10-CM | POA: Insufficient documentation

## 2020-06-09 DIAGNOSIS — Z01812 Encounter for preprocedural laboratory examination: Secondary | ICD-10-CM | POA: Insufficient documentation

## 2020-06-09 LAB — SARS CORONAVIRUS 2 (TAT 6-24 HRS): SARS Coronavirus 2: NEGATIVE

## 2020-06-10 ENCOUNTER — Other Ambulatory Visit: Payer: Self-pay | Admitting: *Deleted

## 2020-06-10 NOTE — Patient Outreach (Signed)
[facility]) Care Management  06/10/2020  T[facility]URE [facility] [birth year] [id number]  Unsuccessful outreach attempt made to patient. Patient answered the phone and stated that she would not be able to speak today. She did request that this nurse call back at a later date.   Plan: RN Health Coach will call patient within the month of March  Blanchie Serve RN, BSN [facility] Care Management  RN Health Coach [phone] [email or web address]

## 2020-06-11 ENCOUNTER — Emergency Department (HOSPITAL_COMMUNITY): Payer: Medicare HMO

## 2020-06-11 ENCOUNTER — Encounter (HOSPITAL_COMMUNITY)
Admission: RE | Disposition: A | Discharge status: Home / Self Care | Payer: Self-pay | Source: Home / Self Care | Attending: Gastroenterology

## 2020-06-11 ENCOUNTER — Encounter (HOSPITAL_COMMUNITY): Payer: Self-pay | Admitting: Gastroenterology

## 2020-06-11 ENCOUNTER — Other Ambulatory Visit: Payer: Self-pay

## 2020-06-11 ENCOUNTER — Ambulatory Visit (HOSPITAL_COMMUNITY): Payer: Medicare HMO | Admitting: Certified Registered"

## 2020-06-11 ENCOUNTER — Encounter (HOSPITAL_COMMUNITY): Payer: Self-pay

## 2020-06-11 ENCOUNTER — Ambulatory Visit (HOSPITAL_COMMUNITY)
Admission: RE | Admit: 2020-06-11 | Discharge: 2020-06-11 | Disposition: A | Discharge status: Home / Self Care | Payer: Medicare HMO | Attending: Gastroenterology | Admitting: Gastroenterology

## 2020-06-11 ENCOUNTER — Emergency Department (HOSPITAL_COMMUNITY)
Admission: EM | Admit: 2020-06-11 | Discharge: 2020-06-11 | Disposition: A | Discharge status: Home / Self Care | Payer: Medicare HMO | Source: Home / Self Care | Attending: Emergency Medicine | Admitting: Emergency Medicine

## 2020-06-11 DIAGNOSIS — R1011 Right upper quadrant pain: Secondary | ICD-10-CM | POA: Insufficient documentation

## 2020-06-11 DIAGNOSIS — K219 Gastro-esophageal reflux disease without esophagitis: Secondary | ICD-10-CM | POA: Insufficient documentation

## 2020-06-11 DIAGNOSIS — I129 Hypertensive chronic kidney disease with stage 1 through stage 4 chronic kidney disease, or unspecified chronic kidney disease: Secondary | ICD-10-CM | POA: Insufficient documentation

## 2020-06-11 DIAGNOSIS — I2510 Atherosclerotic heart disease of native coronary artery without angina pectoris: Secondary | ICD-10-CM | POA: Insufficient documentation

## 2020-06-11 DIAGNOSIS — Z90710 Acquired absence of both cervix and uterus: Secondary | ICD-10-CM | POA: Diagnosis not present

## 2020-06-11 DIAGNOSIS — R1012 Left upper quadrant pain: Secondary | ICD-10-CM | POA: Insufficient documentation

## 2020-06-11 DIAGNOSIS — R079 Chest pain, unspecified: Secondary | ICD-10-CM | POA: Diagnosis not present

## 2020-06-11 DIAGNOSIS — K222 Esophageal obstruction: Secondary | ICD-10-CM | POA: Insufficient documentation

## 2020-06-11 DIAGNOSIS — Z538 Procedure and treatment not carried out for other reasons: Secondary | ICD-10-CM | POA: Diagnosis not present

## 2020-06-11 DIAGNOSIS — R1310 Dysphagia, unspecified: Secondary | ICD-10-CM

## 2020-06-11 DIAGNOSIS — R1013 Epigastric pain: Secondary | ICD-10-CM | POA: Insufficient documentation

## 2020-06-11 DIAGNOSIS — J449 Chronic obstructive pulmonary disease, unspecified: Secondary | ICD-10-CM | POA: Insufficient documentation

## 2020-06-11 DIAGNOSIS — E039 Hypothyroidism, unspecified: Secondary | ICD-10-CM | POA: Insufficient documentation

## 2020-06-11 DIAGNOSIS — Z87891 Personal history of nicotine dependence: Secondary | ICD-10-CM | POA: Insufficient documentation

## 2020-06-11 DIAGNOSIS — N183 Chronic kidney disease, stage 3 unspecified: Secondary | ICD-10-CM | POA: Insufficient documentation

## 2020-06-11 DIAGNOSIS — K760 Fatty (change of) liver, not elsewhere classified: Secondary | ICD-10-CM | POA: Diagnosis not present

## 2020-06-11 DIAGNOSIS — Z79899 Other long term (current) drug therapy: Secondary | ICD-10-CM | POA: Insufficient documentation

## 2020-06-11 DIAGNOSIS — K5730 Diverticulosis of large intestine without perforation or abscess without bleeding: Secondary | ICD-10-CM | POA: Diagnosis not present

## 2020-06-11 DIAGNOSIS — K7689 Other specified diseases of liver: Secondary | ICD-10-CM | POA: Diagnosis not present

## 2020-06-11 LAB — TROPONIN I (HIGH SENSITIVITY)
Troponin I (High Sensitivity): 4 ng/L (ref <18)
Troponin I (High Sensitivity): 5 ng/L (ref <18)

## 2020-06-11 LAB — CBC WITH DIFFERENTIAL/PLATELET
Abs Immature Granulocytes: 0.03 10*3/uL (ref 0.00–0.07)
Basophils Absolute: 0.1 10*3/uL (ref 0.0–0.1)
Basophils Relative: 1 %
Eosinophils Absolute: 0.2 10*3/uL (ref 0.0–0.5)
Eosinophils Relative: 2 %
HCT: 38.1 % (ref 36.0–46.0)
Hemoglobin: 11.5 g/dL — ABNORMAL LOW (ref 12.0–15.0)
Immature Granulocytes: 0 %
Lymphocytes Relative: 24 %
Lymphs Abs: 2.2 10*3/uL (ref 0.7–4.0)
MCH: 27.6 pg (ref 26.0–34.0)
MCHC: 30.2 g/dL (ref 30.0–36.0)
MCV: 91.6 fL (ref 80.0–100.0)
Monocytes Absolute: 0.4 10*3/uL (ref 0.1–1.0)
Monocytes Relative: 5 %
Neutro Abs: 6.2 10*3/uL (ref 1.7–7.7)
Neutrophils Relative %: 68 %
Platelets: 219 10*3/uL (ref 150–400)
RBC: 4.16 MIL/uL (ref 3.87–5.11)
RDW: 16.3 % — ABNORMAL HIGH (ref 11.5–15.5)
WBC: 9.1 10*3/uL (ref 4.0–10.5)
nRBC: 0 % (ref 0.0–0.2)

## 2020-06-11 LAB — COMPREHENSIVE METABOLIC PANEL
ALT: 9 U/L (ref 0–44)
AST: 15 U/L (ref 15–41)
Albumin: 3.4 g/dL — ABNORMAL LOW (ref 3.5–5.0)
Alkaline Phosphatase: 64 U/L (ref 38–126)
Anion gap: 6 (ref 5–15)
BUN: 17 mg/dL (ref 8–23)
CO2: 21 mmol/L — ABNORMAL LOW (ref 22–32)
Calcium: 8.3 mg/dL — ABNORMAL LOW (ref 8.9–10.3)
Chloride: 113 mmol/L — ABNORMAL HIGH (ref 98–111)
Creatinine, Ser: 1.23 mg/dL — ABNORMAL HIGH (ref 0.44–1.00)
GFR, Estimated: 44 mL/min — ABNORMAL LOW (ref >60)
Glucose, Bld: 105 mg/dL — ABNORMAL HIGH (ref 70–99)
Potassium: 4.4 mmol/L (ref 3.5–5.1)
Sodium: 140 mmol/L (ref 135–145)
Total Bilirubin: 0.3 mg/dL (ref 0.3–1.2)
Total Protein: 6.7 g/dL (ref 6.5–8.1)

## 2020-06-11 LAB — LIPASE, BLOOD: Lipase: 36 U/L (ref 11–51)

## 2020-06-11 SURGERY — CANCELLED PROCEDURE

## 2020-06-11 MED ORDER — MORPHINE SULFATE (PF) 4 MG/ML IV SOLN
4.0000 mg | Freq: Once | INTRAVENOUS | Status: AC
  Administered 2020-06-11: 4 mg via INTRAVENOUS
  Filled 2020-06-11: qty 1

## 2020-06-11 MED ORDER — LACTATED RINGERS IV SOLN
INTRAVENOUS | Status: DC | PRN
  Administered 2020-06-11: 10 mL/h via INTRAVENOUS

## 2020-06-11 MED ORDER — SODIUM CHLORIDE 0.9 % IV SOLN: INTRAVENOUS | Status: DC

## 2020-06-11 MED ORDER — DIPHENHYDRAMINE HCL 50 MG/ML IJ SOLN
25.0000 mg | Freq: Once | INTRAMUSCULAR | Status: AC
  Administered 2020-06-11: 25 mg via INTRAVENOUS
  Filled 2020-06-11: qty 1

## 2020-06-11 MED ORDER — METOCLOPRAMIDE HCL 10 MG PO TABS: 10.0000 mg | ORAL_TABLET | Freq: Four times a day (QID) | ORAL | 0 refills | Status: DC | PRN

## 2020-06-11 MED ORDER — METOCLOPRAMIDE HCL 5 MG/ML IJ SOLN
10.0000 mg | Freq: Once | INTRAMUSCULAR | Status: AC
  Administered 2020-06-11: 10 mg via INTRAVENOUS
  Filled 2020-06-11: qty 2

## 2020-06-11 MED ORDER — IOHEXOL 300 MG/ML  SOLN
100.0000 mL | Freq: Once | INTRAMUSCULAR | Status: AC | PRN
  Administered 2020-06-11: 75 mL via INTRAVENOUS

## 2020-06-11 MED ORDER — SODIUM CHLORIDE 0.9 % IV BOLUS: 1000.0000 mL | Freq: Once | INTRAVENOUS | Status: DC

## 2020-06-11 MED ORDER — ONDANSETRON HCL 4 MG/2ML IJ SOLN: 4.0000 mg | Freq: Once | INTRAMUSCULAR | Status: DC

## 2020-06-11 MED ORDER — LACTATED RINGERS IV BOLUS
1000.0000 mL | Freq: Once | INTRAVENOUS | Status: AC
  Administered 2020-06-11: 1000 mL via INTRAVENOUS

## 2020-06-11 SURGICAL SUPPLY — 14 items

## 2020-06-11 NOTE — Discharge Instructions (Signed)
Stay hydrated.  Take Reglan for nausea and it may help with esophageal spasms  Your GI doctor will contact you about rescheduling the procedure to next  Return to ER if you have worse epigastric pain and chest pain and trouble breathing.

## 2020-06-11 NOTE — Progress Notes (Signed)
In the preoperative area, patient was endorsing left sided chest/LUQ pain, radiating around to her back.  She did not mention this on my initial interview with the patient.  Sounds like symptoms have been present, stuttering over the last couple of days, but history not entirely clear.  She describes this as being different from her epigastric pain.  Otherwise remained hemodynamic stable.  EKG obtained by the Anesthesiologist and was unremarkable.  I discussed the symptoms and potential DDx, specifically including cardiopulmonary etiologies, with the patient and with her daughter.  Given her age, medical history, and the symptoms of unclear etiology, in conference with the Anesthesiologist, we decided out of an abundance of caution to cancel today's EGD in favor of expedited evaluation in the ER and likely Cardiology evaluation as well.  Provided work-up otherwise unrevealing, we can certainly expedite getting this EGD done at [facility] in the very near future with me.  I discussed this plan with the patient and her daughter and they both agree and understand.

## 2020-06-11 NOTE — Progress Notes (Signed)
Patient in endo as outpatient for EGD, patient complaining of new epigastric/chest pain- per orders EKG done. Per [facility] with anesthesia and MD Cirigiliano they felt best to have her go to ED to get checked out and cancel procedure for today. Patient taken with daughter to Emergency room.

## 2020-06-11 NOTE — ED Notes (Signed)
Brought pt water and pillow

## 2020-06-11 NOTE — ED Triage Notes (Signed)
Pt was at endoscopy, did not complete endoscopy d/t left sided abd pain. Denies n/v/d.

## 2020-06-11 NOTE — H&P (Signed)
    P  Chief Complaint:    GERD, epigastric pain, esophageal stricture  HPI:     Patient is a 80 y.o. female with a history of COPD, CKD, CAD with cardiac cath 2019, depression, HTN, HLD, allergic rhinitis, anxiety, presenting to [facility] for EGD for evaluation of known esophageal stricture, along with evaluation of epigastric pain and reflux symptoms.  Initially seen in the office 04/11/2020  W/ c/o MEG pain and reflux.  EGD on 04/24/2020 notable for severe stenosis in the proximal esophagus at the cricopharyngeus, which could not be traversed.  Subsequent esophagram on 05/13/2020 with moderate to severe stricture at the cervical/thoracic junction.  The remainder of the esophagus was otherwise normal aside from mild presbyesophagus.  She presents today for EGD with small caliber ultraslim scope.     Review of systems:     No chest pain, no SOB, no fevers, no urinary sx   Past Medical History:  Diagnosis Date  . Allergic rhinitis   . Allergy    seasonal allergies  . Anxiety   . CKD (chronic kidney disease) 04/04/2017  . COPD (chronic obstructive pulmonary disease) (HCC)    uses inhaler  . Depression   . GERD (gastroesophageal reflux disease)   . Gout    hx of  . Hyperlipidemia    on meds  . Hypertension    on meds  . Myocardial infarction (HCC)   . Peptic ulcer 01/04/2003  . Vertigo     Patient's surgical history, family medical history, social history, medications and allergies were all reviewed in Epic    Current Facility-Administered Medications  Medication Dose Route Frequency Provider Last Rate Last Admin  . 0.9 %  sodium chloride infusion   Intravenous Continuous Katie Moch V, DO        Physical Exam:     Ht 5\' 2"  (1.575 m)   Wt 90.3 kg   BMI 36.40 kg/m   GENERAL:  Pleasant female in NAD PSYCH: : Cooperative, normal affect EENT:  conjunctiva pink, mucous membranes moist, neck supple without masses CARDIAC:  RRR, no murmur heard, no  peripheral edema PULM: Normal respiratory effort, lungs CTA bilaterally, no wheezing ABDOMEN:  Nondistended, soft, nontender. No obvious masses, no hepatomegaly,  normal bowel sounds SKIN:  turgor, no lesions seen Musculoskeletal:  Normal muscle tone, normal strength NEURO: Alert and oriented x 3, no focal neurologic deficits   IMPRESSION and PLAN:    1) Epigastric pain 2) GERD 3) Esophageal stricture  -Plan for EGD today with ultraslim scope with possible esophageal dilation, biopsies -Evaluation for PUD, gastritis  4) History of CAD -Continuing ASA 81 mg in perioperative period      ,DO, FACG 06/11/2020, 11:49 AM

## 2020-06-11 NOTE — Interval H&P Note (Signed)
History and Physical Interval Note:  06/11/2020 11:54 AM  Veronica Wolf  has presented today for surgery, with the diagnosis of dysphagia.  The various methods of treatment have been discussed with the patient and family. After consideration of risks, benefits and other options for treatment, the patient has consented to  Procedure(s) with comments: ESOPHAGOGASTRODUODENOSCOPY (EGD) WITH PROPOFOL (N/A) - EGD dilation with ulraslim scope as a surgical intervention.  The patient's history has been reviewed, patient examined, no change in status, stable for surgery.  I have reviewed the patient's chart and labs.  Questions were answered to the patient's satisfaction.     Verlin Dike Rileigh Kawashima

## 2020-06-11 NOTE — ED Provider Notes (Signed)
[facility]-EMERGENCY DEPT Provider Note   CSN: [id number] Arrival date & time: 06/11/20  1310     History Chief Complaint  Patient presents with  . Abdominal Pain    Veronica Wolf is a 80 y.o. female hx of CKD, COPD, reflux, previous MI here presenting with epigastric pain.  Patient went to preop for endoscopy.  Patient has sudden onset of epigastric pain and left upper quadrant pain.  Patient was unable to get endoscopy and was sent here for possible rule out ACS.  Patient actually had extensive work-up recently.  She had a barium swallow that showed an esophageal stricture.  The history is provided by the patient.       Past Medical History:  Diagnosis Date  . Allergic rhinitis   . Allergy    seasonal allergies  . Anxiety   . CKD (chronic kidney disease) 04/04/2017  . COPD (chronic obstructive pulmonary disease) (HCC)    uses inhaler  . Depression   . GERD (gastroesophageal reflux disease)   . Gout    hx of  . Hyperlipidemia    on meds  . Hypertension    on meds  . Myocardial infarction (HCC)   . Peptic ulcer 01/04/2003  . Vertigo     Patient Active Problem List   Diagnosis Date Noted  . Hypothyroid 01/25/2018  . Mild CAD 10/03/2017  . Stress-induced cardiomyopathy 09/19/2017  . Elevated troponin   . Hypokalemia 09/14/2017  . Non-ST elevation (NSTEMI) myocardial infarction [facility]) - due to Demand Infarction 09/14/2017  . CKD (chronic kidney disease), stage III (HCC) 04/04/2017  . HLD (hyperlipidemia)   . Allergic rhinitis   . Gout 02/28/2017  . Depression with anxiety 02/28/2017  . Epigastric abdominal pain 12/22/2016  . COPD with chronic bronchitis (HCC) 12/22/2016  . GERD (gastroesophageal reflux disease) 12/22/2016  . DOE (dyspnea on exertion) 12/22/2016    Past Surgical History:  Procedure Laterality Date  . ABDOMINAL HYSTERECTOMY    . APPENDECTOMY    . LEFT HEART CATH AND CORONARY ANGIOGRAPHY N/A 06/13/2017   Procedure: LEFT  HEART CATH AND CORONARY ANGIOGRAPHY;  Surgeon: Marykay Lex, MD;  Location: [facility] INVASIVE CV LAB;  Service: Cardiovascular;  Laterality: N/A;  . RIGHT/LEFT HEART CATH AND CORONARY ANGIOGRAPHY N/A 09/16/2017   Procedure: RIGHT/LEFT HEART CATH AND CORONARY ANGIOGRAPHY;  Surgeon: Swaziland, Peter M, MD;  Location: [facility] INVASIVE CV LAB;  Service: Cardiovascular;  Laterality: N/A;  . ULTRASOUND GUIDANCE FOR VASCULAR ACCESS  09/16/2017   Procedure: Ultrasound Guidance For Vascular Access;  Surgeon: Swaziland, Peter M, MD;  Location: [facility] INVASIVE CV LAB;  Service: Cardiovascular;;  . WISDOM TOOTH EXTRACTION       OB History   No obstetric history on file.     Family History  Problem Relation Age of Onset  . Breast cancer Mother   . Stroke Father   . Stomach cancer Neg Hx   . Colon cancer Neg Hx   . Pancreatic cancer Neg Hx   . Esophageal cancer Neg Hx   . Colon polyps Neg Hx   . Rectal cancer Neg Hx     Social History   Tobacco Use  . Smoking status: Former Smoker    Quit date: 04/24/2000    Years since quitting: 20.1  . Smokeless tobacco: Never Used  Vaping Use  . Vaping Use: Never used  Substance Use Topics  . Alcohol use: Not Currently    Comment: has previous hx of ETOH abuse,  quit 2014  . Drug use: No    Home Medications Prior to Admission medications   Medication Sig Start Date End Date Taking? Authorizing Provider  albuterol (PROVENTIL) (2.5 MG/3ML) 0.083% nebulizer solution USE 1 VIAL IN NEBULIZER EVERY 6 HOURS AS NEEDED FOR WHEEZING AND FOR SHORTNESS OF BREATH Patient taking differently: Take 2.5 mg by nebulization every 6 (six) hours as needed for wheezing or shortness of breath. 08/23/19   [name], NP  allopurinol (ZYLOPRIM) 100 MG tablet Take 2 tablets (200 mg total) by mouth daily. 02/26/20   [name], NP  atorvastatin (LIPITOR) 40 MG tablet Take 1 tablet (40 mg total) by mouth daily at 6 PM. 02/26/20   [name], NP  cetirizine (ZYRTEC) 10  MG tablet Take 10 mg by mouth daily as needed for allergies.    [provider]  colchicine 0.6 MG tablet Take 0.6 mg by mouth daily as needed.    [provider]  diazepam (VALIUM) 10 MG tablet TAKE 1 TABLET BY MOUTH EVERY 12 HOURS AS NEEDED FOR ANXIETY Patient taking differently: Take 10 mg by mouth at bedtime as needed for sleep. 06/01/20   [name], NP  dicyclomine (BENTYL) 10 MG capsule Take 1 capsule (10 mg total) by mouth in the morning, at noon, in the evening, and at bedtime. Patient taking differently: Take 10 mg by mouth 3 (three) times daily as needed for spasms. 02/26/20   [name], NP  escitalopram (LEXAPRO) 20 MG tablet Take 1 tablet (20 mg total) by mouth daily. 02/26/20   [name], NP  famotidine (PEPCID) 20 MG tablet Take 1 tablet (20 mg total) by mouth 2 (two) times daily. Patient taking differently: Take 20 mg by mouth 2 (two) times daily as needed. 05/12/20   Cirigliano, Vito V, DO  levothyroxine (EUTHYROX) 25 MCG tablet TAKE 1 TABLET BY MOUTH ONCE DAILY BEFORE BREAKFAST Patient taking differently: Take 25 mcg by mouth daily before breakfast. TAKE 1 TABLET BY MOUTH ONCE DAILY BEFORE BREAKFAST 02/26/20   [name], NP  lisinopril (ZESTRIL) 2.5 MG tablet Take 1 tablet (2.5 mg total) by mouth daily. 02/26/20   [name], NP  meclizine (ANTIVERT) 25 MG tablet Take 1 tablet (25 mg total) by mouth 2 (two) times daily as needed for dizziness. 04/28/20   [name], NP  metoprolol succinate (TOPROL-XL) 25 MG 24 hr tablet Take 0.5 tablets (12.5 mg total) by mouth daily. 02/25/20   [name], NP  nitroGLYCERIN (NITROSTAT) 0.4 MG SL tablet Place 1 tablet (0.4 mg total) under the tongue every 5 (five) minutes as needed. 03/05/19 06/03/19  [name], NP  nystatin (NYSTATIN) powder APPLY  POWDER TOPICALLY TO AFFECTED AREA 2 TIMES DAILY Patient taking differently: Apply 1 application topically 2 (two)  times daily as needed (skin irritation). 04/17/20   [name], NP  pantoprazole (PROTONIX) 40 MG tablet Take 1 tablet (40 mg total) by mouth 2 (two) times daily. 04/09/20   [name], NP  potassium chloride SA (KLOR-CON) 20 MEQ tablet Take 1 tablet (20 mEq total) by mouth daily. 02/26/20   [name], NP  sucralfate (CARAFATE) 1 g tablet Take 1 tablet (1 g total) by mouth 4 (four) times daily. May cut pill in half and dissolve in water prior to drinking. 04/30/20   Cirigliano, Vito V, DO    Allergies    Ibuprofen  Review of Systems   Review of Systems  Gastrointestinal: Positive for abdominal pain.  All other systems reviewed  and are negative.   Physical Exam Updated Vital Signs BP (!) 152/70 (BP Location: Left Arm)   Pulse 68   Temp 97.6 F (36.4 C) (Oral)   Resp 18   Ht 5\' 2"  (1.575 m)   Wt 90 kg   SpO2 95%   BMI 36.29 kg/m   Physical Exam Vitals and nursing note reviewed.  Constitutional:      Comments: Slightly uncomfortable  HENT:     Head: Normocephalic.     Mouth/Throat:     Mouth: Mucous membranes are moist.  Eyes:     Extraocular Movements: Extraocular movements intact.     Pupils: Pupils are equal, round, and reactive to light.  Cardiovascular:     Rate and Rhythm: Normal rate and regular rhythm.     Heart sounds: Normal heart sounds.  Pulmonary:     Effort: Pulmonary effort is normal.     Breath sounds: Normal breath sounds.  Abdominal:     General: Abdomen is flat.     Comments: Mild epigastric and right upper quadrant tenderness  Skin:    General: Skin is warm.     Capillary Refill: Capillary refill takes less than 2 seconds.  Neurological:     General: No focal deficit present.     Mental Status: She is alert.  Psychiatric:        Mood and Affect: Mood normal.     ED Results / Procedures / Treatments   Labs (all labs ordered are listed, but only abnormal results are displayed) Labs Reviewed  CBC WITH  DIFFERENTIAL/PLATELET  COMPREHENSIVE METABOLIC PANEL  LIPASE, BLOOD  TROPONIN I (HIGH SENSITIVITY)    EKG None  Radiology No results found.  Procedures Procedures   Medications Ordered in ED Medications  morphine 4 MG/ML injection 4 mg (has no administration in time range)  sodium chloride 0.9 % bolus 1,000 mL (has no administration in time range)  metoCLOPramide (REGLAN) injection 10 mg (has no administration in time range)  diphenhydrAMINE (BENADRYL) injection 25 mg (has no administration in time range)    ED Course  I have reviewed the triage vital signs and the nursing notes.  Pertinent labs & imaging results that were available during my care of the patient were reviewed by me and considered in my medical decision making (see chart for details).    MDM Rules/Calculators/A&P                         Veronica Wolf is a 80 y.o. female here presenting with abdominal pain.  Patient has esophageal stricture wonder if she has esophageal spasms.  Can be atypical for ACS as well.  Also consider stomach ulcer.  Plan to get CBC, CMP, troponin x2, CT abdomen pelvis.  Will also contact her GI doctor regarding recommendations  7:10 PM CT abdomen pelvis unremarkable.  Troponin negative x2. Discussed with Dr. 96 (her GI doctor).  He states that if the work-up is negative, he plans to follow-up with her next week for a esophageal dilation and look for stomach ulcer.   Final Clinical Impression(s) / ED Diagnoses Final diagnoses:  None    Rx / DC Orders ED Discharge Orders    None       Barron Alvine, MD 06/11/20 1911

## 2020-06-11 NOTE — ED Notes (Signed)
Pt and family requesting update from provider, c/o time in hospital, provider made aware

## 2020-06-11 NOTE — Anesthesia Preprocedure Evaluation (Signed)
Anesthesia Evaluation    Reviewed: Allergy & Precautions, Patient's Chart, lab work & pertinent test results, reviewed documented beta blocker date and time   History of Anesthesia Complications Negative for: history of anesthetic complications  Airway        Dental   Pulmonary COPD, former smoker,           Cardiovascular hypertension, Pt. on home beta blockers and Pt. on medications + CAD, + Past MI and + DOE       Neuro/Psych Anxiety Depression negative neurological ROS     GI/Hepatic Neg liver ROS, PUD, GERD  Medicated and Controlled,  Endo/Other  Hypothyroidism   Renal/GU Renal InsufficiencyRenal disease  negative genitourinary   Musculoskeletal negative musculoskeletal ROS (+)   Abdominal   Peds  Hematology negative hematology ROS (+)   Anesthesia Other Findings Day of surgery medications reviewed with patient.  Reproductive/Obstetrics negative OB ROS                            Anesthesia Physical Anesthesia Plan  ASA:   Anesthesia Plan:    Post-op Pain Management:    Induction:   PONV Risk Score and Plan:   Airway Management Planned:   Additional Equipment:   Intra-op Plan:   Post-operative Plan:   Informed Consent:   Plan Discussed with:   Anesthesia Plan Comments: (Patient c/o 8/10 epigastric and left upper quadrant "aching" pain that began suddenly in preop area. No dyspnea, diaphoresis, nausea, presyncope. Pain radiates to back. Denies any pain like this in the past. She does have chronic epigastric discomfort, but left chest/abdominal and back pain is new. Vital signs stable. 12 lead EKG unremarkable. Recommended ED evaluation. Dr. Barron Alvine at bedside and has communicated with patient's daughter as well. Stephannie Peters, MD)        Anesthesia Quick Evaluation

## 2020-06-13 ENCOUNTER — Other Ambulatory Visit: Payer: Self-pay | Admitting: Gastroenterology

## 2020-06-13 ENCOUNTER — Telehealth: Payer: Self-pay | Admitting: Gastroenterology

## 2020-06-13 DIAGNOSIS — R1310 Dysphagia, unspecified: Secondary | ICD-10-CM

## 2020-06-13 DIAGNOSIS — K222 Esophageal obstruction: Secondary | ICD-10-CM

## 2020-06-13 NOTE — Telephone Encounter (Signed)
Patient called to reschedule procedure at the hospital.

## 2020-06-13 NOTE — Telephone Encounter (Signed)
Scheduleed [facility] EGD. Patient has a preop visit prior to go over instructions.

## 2020-06-19 ENCOUNTER — Other Ambulatory Visit: Payer: Self-pay | Admitting: *Deleted

## 2020-06-19 NOTE — Patient Outreach (Signed)
[facility]) Care Management  06/19/2020  Veronica Wolf 12-Apr-1940 [id number]  Unsuccessful outreach attempt made to patient. Patient answered the phone and stated that she would not be able to speak today. She did request that this nurse call back at a later date.   Plan: RN Health Coach will call patient within the month of April.  Blanchie Serve RN, BSN [facility] Care Management  RN Health Coach [phone] [name]

## 2020-06-25 ENCOUNTER — Telehealth: Payer: Self-pay | Admitting: Family

## 2020-06-25 MED ORDER — SUCRALFATE 1 GM/10ML PO SUSP: 1.0000 g | Freq: Four times a day (QID) | ORAL | 1 refills | Status: DC

## 2020-06-25 NOTE — Telephone Encounter (Signed)
Patient called and stated that she is taking Sucralfate 1 mg  4 X a day however this medicine is $100.00 it's  to expensive for her,pt would like to know is there a generic brand that you can prescribe her pt has  not taking the Sucralfate for a few day and now she feel the pain is coming back

## 2020-06-25 NOTE — Telephone Encounter (Signed)
Patient advised of new rx.

## 2020-06-25 NOTE — Telephone Encounter (Signed)
I sent carafate liquid instead. I am not sure if it will be cheaper though. Please remove carafate tabs from med list.

## 2020-06-27 NOTE — Telephone Encounter (Signed)
[facility] called stating carafate/sucralfate is the generic version the price is $100.00. wondering if you could prescribe something else

## 2020-06-30 NOTE — Telephone Encounter (Signed)
Please advise, I thing the patient may benefit from [email or web address]

## 2020-07-01 NOTE — Telephone Encounter (Signed)
It looks like the carafate tablets are $18 with good rx.  I have texted her a copy of the coupon card for [facility].

## 2020-07-01 NOTE — Addendum Note (Signed)
Addended by: [name] on: 07/01/2020 07:12 AM   Modules accepted: Orders

## 2020-07-02 ENCOUNTER — Other Ambulatory Visit: Payer: Self-pay | Admitting: Gastroenterology

## 2020-07-02 ENCOUNTER — Telehealth: Payer: Self-pay | Admitting: Family

## 2020-07-02 ENCOUNTER — Telehealth: Payer: Self-pay | Admitting: Gastroenterology

## 2020-07-02 NOTE — Telephone Encounter (Signed)
Medication: diazepam (VALIUM) 10 MG tablet [[id number]]      Has the patient contacted their pharmacy? no (If no, request that the patient contact the pharmacy for the refill.) (If yes, when and what did the pharmacy advise?)    Preferred Pharmacy (with phone number or street name)  [facility] [address] [place], [place] - [address]  [address] [place] [address]  Phone:  [phone] Fax:  [phone]    Agent: Please be advised that RX refills may take up to 3 business days. We ask that you follow-up with your pharmacy.

## 2020-07-02 NOTE — Telephone Encounter (Signed)
Patrina from [employer] is requesting a call back from a nurse since pt is requesting a tablet form of the Carafate instead of the liquid since it'll be cheaper for the pt.

## 2020-07-03 MED ORDER — DIAZEPAM 10 MG PO TABS: 10.0000 mg | ORAL_TABLET | Freq: Two times a day (BID) | ORAL | 0 refills | Status: DC | PRN

## 2020-07-03 NOTE — Telephone Encounter (Signed)
Last RX:06-01-20 Last OV:04-09-2020 Next OV: none UDS:03-14-20 CSC:03-22-17

## 2020-07-28 ENCOUNTER — Other Ambulatory Visit: Payer: Self-pay | Admitting: Family

## 2020-07-28 NOTE — Telephone Encounter (Signed)
Diazepam 10 mg Last RX: 07-03-2020 Last OV:04-09-2020 Next OV: no future appointments UDS:03-14-2020 CSC:03-14-2020

## 2020-07-29 ENCOUNTER — Telehealth: Payer: Self-pay | Admitting: *Deleted

## 2020-07-29 ENCOUNTER — Telehealth: Payer: Self-pay | Admitting: Family

## 2020-07-29 NOTE — Telephone Encounter (Signed)
Can you please call pt and check on her. If chest pain she should go to ER if GI symptoms needs OV please.

## 2020-07-29 NOTE — Telephone Encounter (Signed)
Spoke with patient regarding symptoms.  Patient reports taking Pepto-Bismol and all symptoms have resolved.  Declines appointment at this time. Advised to go to ER if experiencing chest pain, verbalized understanding.   Patient has virtual appointment with GI tomorrow for EGD pre-op.

## 2020-07-29 NOTE — Telephone Encounter (Signed)
FYI  Looking at the message they gave her some home care instructions

## 2020-07-29 NOTE — Telephone Encounter (Signed)
[facility] is requesting rx. Medication [facility].. now patient wants to go [facility] to [facility]  Medication: dicyclomine (BENTYL) 10 MG capsule [[id number]]   meclizine (ANTIVERT) 25 MG tablet [[id number]]      Has the patient contacted their pharmacy?  (If no, request that the patient contact the pharmacy for the refill.) (If yes, when and what did the pharmacy advise?)     Preferred Pharmacy (with phone number or street name):[facility] - [place], [place] - [place] [id number]  [address], [place] [address]  Phone:  [phone] Fax:  [phone]      Agent: Please be advised that RX refills may take up to 3 business days. We ask that you follow-up with your pharmacy.

## 2020-07-29 NOTE — Telephone Encounter (Signed)
Who Is Calling Patient / Member / Family / Caregiver Call Type Triage / Clinical Relationship To Patient Self Return Phone Number [phone] (Primary) Chief Complaint CHEST PAIN - pain, pressure, heaviness or tightness Reason for Call Symptomatic / Request for Health Information Who Is Calling Patient / Member / Family / Caregiver Call Type Triage / Clinical Relationship To Patient Self Return Phone Number [phone] (Primary) Chief Complaint CHEST PAIN - pain, pressure, heaviness or tightness Reason for Call Symptomatic / Request for Health Information Initial Comment Caller says that she called earlier because she was having chest pains. She wants to know if she missed the return call. The chest pain is not as bad, but her stomach hurts. She has GURD. Drinking a lot of Ginger Ale and water. Should she take Pepto-Bismal? Translation No Nurse Assessment Nurse: Mariah Milling, RN, Diana Eves Date/Time (Eastern Time): 07/28/2020 5:13:45 PM Confirm and document reason for call. If symptomatic, describe symptoms. ---Caller says that she called earlier because she was having chest pains. She wants to know if she missed the return call. The chest pain is not as bad, but her stomach hurts. She has GERD. Drinking a lot of Ginger Ale and water. Should she take Pepto-Bismol? chest pain is gone. stomach pain at this time. caller has diarrhea and vomiting. no fever. vomiting 2 episodes. has not been eating much. drinking fluids. diarrhea multiple episodes as well. no weakness no confusion.Translation No Nurse Assessment Nurse: Mariah Milling, RN, Diana Eves Date/Time Lamount Cohen Time): 07/28/2020 5:13:45 PM Confirm and document reason for call. If symptomatic, describe symptoms. ---Caller says that she called earlier because she was having chest pains. She wants to know if she missed the return call. The chest pain is not as bad, but her stomach hurts. She has GERD. Drinking a lot of Ginger Ale and water.  Should she take Pepto-Bismol? chest pain is gone. stomach pain at this time. caller has diarrhea and vomiting. no fever. vomiting 2 episodes. has not been eating much. drinking fluids. diarrhea multiple episodes as well. no weakness no confusion.    Guidelines Guideline Title Affirmed Question Affirmed Notes Nurse Date/Time (Eastern Time) AND [2] comes and goes (cramps) Disp. Time Lamount Cohen Time) Disposition Final User 07/28/2020 5:11:23 PM Send to Urgent Ladoris Gene 07/28/2020 5:21:45 PM Home Care Yes Mariah Milling, RN, Diana Eves Caller Disagree/Comply Comply Caller Understands Yes PreDisposition Did not know what to do Care Advice Given Per Guideline HOME CARE: * You should be able to treat this at home. REASSURANCE AND EDUCATION - STOMACH PAIN: * It doesn't sound like a serious stomachache. * A mild stomachache can be from indigestion, stomach irritation, or overeating. * Sometimes a stomachache signals the onset of a vomiting illness from a virus. * Here is some care advice that should help. OTC MEDS - BISMUTH SUBSALICYLATE (E.G., KAOPECTATE, PEPTO-BISMOL): * Helps reduce abdominal cramping, diarrhea, and vomiting. * Adult dosage: two tablets or two tablespoons (30 ml) PO. Maximum of 8 doses in a 24 hour period. * Do not use for more than 2 days. * May cause a temporary darkening of stool and tongue. CAUTION - BISMUTH SUBSALICYLATE (E.G., KAOPECTATE, PEPTO-BISMOL): * Do not use if allergic to aspirin. - Do not use in pregnancy. * Read and follow the package instructions carefully. DIET: * Slowly advance diet from clear liquids to a bland diet. * Avoid alcohol or caffeinated beverages. * Avoid greasy or fatty foods. EXPECTED COURSE - ABDOMINAL PAIN: * With harmless causes, the pain is usually better or goes  away within 2 hours. * With viral gastroenteritis ('stomach flu'), belly cramps may occur before each bout of vomiting or diarrhea and may last 2 to 3 days. * With serious causes  (such as appendicitis) the pain becomes constant and more severe. CARE ADVICE given per Abdominal Pain, Female (Adult) guideline. * You become worse * Intermittent pains (e.g., comes and goes, cramps) lasts over 48 hours * Constant pain lasts over 2 hours * Severe pain lasts over 1 hour CALL BACK IF

## 2020-07-30 ENCOUNTER — Other Ambulatory Visit: Payer: Self-pay

## 2020-07-30 ENCOUNTER — Ambulatory Visit (AMBULATORY_SURGERY_CENTER): Payer: Self-pay | Admitting: *Deleted

## 2020-07-30 DIAGNOSIS — R1310 Dysphagia, unspecified: Secondary | ICD-10-CM

## 2020-07-30 DIAGNOSIS — K222 Esophageal obstruction: Secondary | ICD-10-CM

## 2020-07-30 DIAGNOSIS — R42 Dizziness and giddiness: Secondary | ICD-10-CM

## 2020-07-30 MED ORDER — MECLIZINE HCL 25 MG PO TABS: 25.0000 mg | ORAL_TABLET | Freq: Two times a day (BID) | ORAL | 1 refills | Status: DC | PRN

## 2020-07-30 MED ORDER — DICYCLOMINE HCL 10 MG PO CAPS: 10.0000 mg | ORAL_CAPSULE | Freq: Four times a day (QID) | ORAL | 1 refills | Status: DC

## 2020-07-30 NOTE — Progress Notes (Signed)
No egg or soy allergy known to patient  No issues with past sedation with any surgeries or procedures Patient denies ever being told they had issues or difficulty with intubation  No FH of Malignant Hyperthermia No diet pills per patient No home 02 use per patient  No blood thinners per patient  Pt denies issues with constipation  No A fib or A flutter  EMMI video to pt or via MyChart  COVID 19 guidelines implemented in PV today with Pt and RN  Pt is fully vaccinated  for Covid    VIRTUAL PREVISIT COMPLETED. INSTRUCTIONS MAILED TO PATIENT.  Due to the COVID-19 pandemic we are asking patients to follow certain guidelines.  Pt aware of COVID protocols and [facility] guidelines

## 2020-07-30 NOTE — Telephone Encounter (Signed)
Medication sent.

## 2020-07-31 ENCOUNTER — Encounter: Payer: Self-pay | Admitting: *Deleted

## 2020-07-31 ENCOUNTER — Other Ambulatory Visit: Payer: Self-pay | Admitting: *Deleted

## 2020-07-31 NOTE — Patient Instructions (Addendum)
Goals Addressed            This Visit's Progress   . [facility]) Patient will verbalize maintaining nutrition while experiencing dysphagia for the next 90 days       Timeframe:  Long-Range Goal Priority:  High Start Date: 07/31/20                           Expected End Date: 11/01/20 Follow Up Date: 10/02/20  -Discussed drinking supplements to help maintain nutrition -Encouraged patient to continue to drink plenty of water and fluid -Discussed eating soft foods that are easy to swallow   Notes: Patient states she has had swallowing difficulties for a few months. She is being followed by Dr. Barron Alvine and will have a her esophagus dilated on 08/13/20. Patient has been eating soft foods and drinking plenty of water and fluids. Nurse discussed with patient about drinking nutritional supplements and nurse will send her Ensure coupons.                        Marland Kitchen [facility]) Track and Manage My Symptoms       Timeframe:  Long-Range Goal Priority:  High Start Date: 01/04/20                            Expected End Date: 02/01/21                      Follow Up Date 10/02/20   - develop a rescue plan - eliminate symptom triggers at home - follow rescue plan if symptoms flare-up - keep follow-up appointments    Why is this important?   Tracking your symptoms and other information about your health helps your doctor plan your care.  Write down the symptoms, the time of day, what you were doing and what medicine you are taking.  You will soon learn how to manage your symptoms.     Notes: Updated 07/29/20: Patient reports that her COPD is currently well controlled.     . ([facility])Track and Manage My Triggers       Timeframe:  Long-Range Goal Priority:  High Start Date: 01/04/20                            Expected End Date: 02/01/21                      Follow Up Date: 10/02/20   - identify and remove indoor air pollutants - limit outdoor activity during cold weather    Why is this important?   Triggers  are activities or things, like tobacco smoke or cold weather, that make your COPD (chronic obstructive pulmonary disease) flare-up.  Knowing these triggers helps you plan how to stay away from them.  When you cannot remove them, you can learn how to manage them.     Notes: Updated 07/31/20: Patient states that her home environment is safe and that currently her COPD is well controlled.

## 2020-07-31 NOTE — Patient Outreach (Signed)
[facility]) Care Management  [facility] Care Manager  07/31/2020   Veronica Wolf 07-Sep-1940 [id number]  Subjective: Successful telephone outreach call to patient. HIPAA identifiers obtained. Patient states that she will have surgery on 08/13/20 to dilate his esophagus. Patient states she has experienced dysphagia for several months. She reports that she has been able to swallow soft foods and take her medications thus far. Nurse discussed drinking nutritional supplements and will send patient Ensure coupons. Patient explains that currently her COPD is well controlled. She is tolerating the high pollen weather well and uses her nebulizer 1-2 times daily which is her baseline. Patient inform nurse that she needed to end our conversation because she needed to call her pharmacy to add a medication to the her order to be delivered today.   Encounter Medications:  Outpatient Encounter Medications as of 07/31/2020  Medication Sig Note  . albuterol (PROVENTIL) (2.5 MG/3ML) 0.083% nebulizer solution USE 1 VIAL IN NEBULIZER EVERY 6 HOURS AS NEEDED FOR WHEEZING AND FOR SHORTNESS OF BREATH (Patient taking differently: Take 2.5 mg by nebulization every 6 (six) hours as needed for wheezing or shortness of breath.)   . allopurinol (ZYLOPRIM) 100 MG tablet Take 2 tablets (200 mg total) by mouth daily.   Marland Kitchen atorvastatin (LIPITOR) 40 MG tablet Take 1 tablet (40 mg total) by mouth daily at 6 PM.   . cetirizine (ZYRTEC) 10 MG tablet Take 10 mg by mouth daily as needed for allergies.   Marland Kitchen colchicine 0.6 MG tablet Take 0.6 mg by mouth daily as needed.   . diazepam (VALIUM) 10 MG tablet TAKE 1 TABLET BY MOUTH EVERY 12 HOURS AS NEEDED FOR ANXIETY   . dicyclomine (BENTYL) 10 MG capsule Take 1 capsule (10 mg total) by mouth in the morning, at noon, in the evening, and at bedtime.   Marland Kitchen escitalopram (LEXAPRO) 20 MG tablet Take 1 tablet (20 mg total) by mouth daily.   . famotidine (PEPCID) 20 MG tablet Take 1 tablet  (20 mg total) by mouth 2 (two) times daily. (Patient taking differently: Take 20 mg by mouth 2 (two) times daily as needed.)   . levothyroxine (EUTHYROX) 25 MCG tablet TAKE 1 TABLET BY MOUTH ONCE DAILY BEFORE BREAKFAST (Patient taking differently: Take 25 mcg by mouth daily before breakfast. TAKE 1 TABLET BY MOUTH ONCE DAILY BEFORE BREAKFAST) 06/03/2020: On hold until patient refills  . lisinopril (ZESTRIL) 2.5 MG tablet Take 1 tablet (2.5 mg total) by mouth daily.   . meclizine (ANTIVERT) 25 MG tablet Take 1 tablet (25 mg total) by mouth 2 (two) times daily as needed for dizziness.   . metoCLOPramide (REGLAN) 10 MG tablet Take 1 tablet (10 mg total) by mouth every 6 (six) hours as needed for nausea (nausea/headache).   . metoprolol succinate (TOPROL-XL) 25 MG 24 hr tablet Take 0.5 tablets (12.5 mg total) by mouth daily.   Marland Kitchen nystatin (NYSTATIN) powder APPLY  POWDER TOPICALLY TO AFFECTED AREA 2 TIMES DAILY (Patient taking differently: Apply 1 application topically 2 (two) times daily as needed (skin irritation).)   . pantoprazole (PROTONIX) 40 MG tablet Take 1 tablet by mouth twice daily   . potassium chloride SA (KLOR-CON) 20 MEQ tablet Take 1 tablet (20 mEq total) by mouth daily.   . sucralfate (CARAFATE) 1 g tablet TAKE ONE TABLET BY MOUTH FOUR TIMES DAILY. MAY cut pill in half AND DISSOLVE in water prior TO drinking   . nitroGLYCERIN (NITROSTAT) 0.4 MG SL tablet Place 1 tablet (  0.4 mg total) under the tongue every 5 (five) minutes as needed. 06/03/2020: On hold--patient needs refills authorized   No facility-administered encounter medications on file as of 07/31/2020.    Functional Status:  In your present state of health, do you have any difficulty performing the following activities: 10/09/2019  Hearing? N  Vision? N  Difficulty concentrating or making decisions? N  Walking or climbing stairs? N  Dressing or bathing? N  Doing errands, shopping? N  Preparing Food and eating ? N  Using the  Toilet? N  In the past six months, have you accidently leaked urine? N  Do you have problems with loss of bowel control? N  Managing your Medications? N  Managing your Finances? N  Housekeeping or managing your Housekeeping? N  Some recent data might be hidden    Fall/Depression Screening: Fall Risk  07/31/2020 01/04/2020 10/09/2019  Falls in the past year? 0 0 0  Number falls in past yr: 0 0 0  Injury with Fall? 0 0 0  Risk for fall due to : Impaired mobility;Impaired balance/gait;History of fall(s) Medication side effect;Impaired vision Medication side effect;Impaired vision  Follow up Falls prevention discussed;Education provided;Falls evaluation completed Falls evaluation completed;Education provided Falls evaluation completed;Education provided   [facility] 2/9 Scores 07/31/2020 10/09/2019 12/26/2017 10/12/2017 09/26/2017  PHQ - 2 Score 1 0 3 0 1  PHQ- 9 Score - - 11 - -    Assessment:  Goals Addressed            This Visit's Progress   . [facility]) Patient will verbalize maintaining nutrition while experiencing dysphagia for the next 90 days       Timeframe:  Long-Range Goal Priority:  High Start Date: 07/31/20                           Expected End Date: 11/01/20 Follow Up Date: 10/02/20  -Discussed drinking supplements to help maintain nutrition -Encouraged patient to continue to drink plenty of water and fluid -Discussed eating soft foods that are easy to swallow   Notes: Patient states she has had swallowing difficulties for a few months. She is being followed by Dr. Barron Alvine and will have a her esophagus dilated on 08/13/20. Patient has been eating soft foods and drinking plenty of water and fluids. Nurse discussed with patient about drinking nutritional supplements and nurse will send her Ensure coupons.                        Marland Kitchen [facility]) Track and Manage My Symptoms       Timeframe:  Long-Range Goal Priority:  High Start Date: 01/04/20                            Expected End Date:  02/01/21                      Follow Up Date 10/02/20   - develop a rescue plan - eliminate symptom triggers at home - follow rescue plan if symptoms flare-up - keep follow-up appointments    Why is this important?   Tracking your symptoms and other information about your health helps your doctor plan your care.  Write down the symptoms, the time of day, what you were doing and what medicine you are taking.  You will soon learn how to manage your symptoms.  Notes: Updated 07/29/20: Patient reports that her COPD is currently well controlled.     . ([facility])Track and Manage My Triggers       Timeframe:  Long-Range Goal Priority:  High Start Date: 01/04/20                            Expected End Date: 02/01/21                      Follow Up Date: 10/02/20   - identify and remove indoor air pollutants - limit outdoor activity during cold weather    Why is this important?   Triggers are activities or things, like tobacco smoke or cold weather, that make your COPD (chronic obstructive pulmonary disease) flare-up.  Knowing these triggers helps you plan how to stay away from them.  When you cannot remove them, you can learn how to manage them.     Notes: Updated 07/31/20: Patient states that her home environment is safe and that currently her COPD is well controlled.      Plan: RN Health Coach will send PCP today's assessment note, will send patient Ensure coupons, and will call patient within the month of May. Follow-up:  Patient agrees to Care Plan and Follow-up.  Blanchie Serve RN, BSN [facility] Care Management  RN Health Coach [phone] [email or web address]

## 2020-08-04 ENCOUNTER — Encounter: Payer: Self-pay | Admitting: Gastroenterology

## 2020-08-06 ENCOUNTER — Encounter: Payer: Self-pay | Admitting: Gastroenterology

## 2020-08-07 ENCOUNTER — Other Ambulatory Visit: Payer: Self-pay | Admitting: Family

## 2020-08-11 ENCOUNTER — Other Ambulatory Visit (HOSPITAL_COMMUNITY)
Admission: RE | Admit: 2020-08-11 | Discharge: 2020-08-11 | Disposition: A | Discharge status: Home / Self Care | Payer: Medicare HMO | Source: Ambulatory Visit | Attending: Gastroenterology | Admitting: Gastroenterology

## 2020-08-11 ENCOUNTER — Other Ambulatory Visit: Payer: Self-pay

## 2020-08-11 ENCOUNTER — Telehealth: Payer: Self-pay | Admitting: Family

## 2020-08-11 DIAGNOSIS — Z20822 Contact with and (suspected) exposure to covid-19: Secondary | ICD-10-CM | POA: Diagnosis not present

## 2020-08-11 DIAGNOSIS — Z01812 Encounter for preprocedural laboratory examination: Secondary | ICD-10-CM | POA: Diagnosis not present

## 2020-08-11 LAB — SARS CORONAVIRUS 2 (TAT 6-24 HRS): SARS Coronavirus 2: NEGATIVE

## 2020-08-11 MED ORDER — ESCITALOPRAM OXALATE 20 MG PO TABS: 20.0000 mg | ORAL_TABLET | Freq: Every day | ORAL | 1 refills | Status: DC

## 2020-08-11 NOTE — Telephone Encounter (Signed)
[facility] states patient would like to transfer all active medication to them  [facility] - [place], [place] - [address] Phone:  [phone]  Fax:  [phone]

## 2020-08-12 ENCOUNTER — Other Ambulatory Visit: Payer: Self-pay

## 2020-08-12 MED ORDER — ALLOPURINOL 100 MG PO TABS: 200.0000 mg | ORAL_TABLET | Freq: Every day | ORAL | 1 refills | Status: DC

## 2020-08-12 MED ORDER — ATORVASTATIN CALCIUM 40 MG PO TABS
40.0000 mg | ORAL_TABLET | Freq: Every day | ORAL | 1 refills | Status: DC
Start: 2020-08-12 — End: 2020-08-19

## 2020-08-12 MED ORDER — POTASSIUM CHLORIDE CRYS ER 20 MEQ PO TBCR: 20.0000 meq | EXTENDED_RELEASE_TABLET | Freq: Every day | ORAL | 1 refills | Status: DC

## 2020-08-12 MED ORDER — ESCITALOPRAM OXALATE 20 MG PO TABS: 20.0000 mg | ORAL_TABLET | Freq: Every day | ORAL | 1 refills | Status: DC

## 2020-08-12 MED ORDER — LISINOPRIL 2.5 MG PO TABS: 2.5000 mg | ORAL_TABLET | Freq: Every day | ORAL | 1 refills | Status: DC

## 2020-08-12 MED ORDER — METOPROLOL SUCCINATE ER 25 MG PO TB24: 12.5000 mg | ORAL_TABLET | Freq: Every day | ORAL | 0 refills | Status: DC

## 2020-08-12 MED ORDER — LEVOTHYROXINE SODIUM 25 MCG PO TABS: ORAL_TABLET | ORAL | 1 refills | Status: DC

## 2020-08-12 NOTE — Telephone Encounter (Signed)
Spoke w/ Katrina from [employer]- calling to check status of refills- she has received a call from Pt 7 times and would like to expedite refills.

## 2020-08-12 NOTE — Telephone Encounter (Signed)
Refill request received from [facility] for mediations.  Spoke with patient and she is using [facility].  All rx have been sent to [facility] yesterday.

## 2020-08-12 NOTE — Telephone Encounter (Signed)
[facility] at patient's request. She was scheduled to come in for follow up 08-18-2020

## 2020-08-12 NOTE — Anesthesia Preprocedure Evaluation (Addendum)
Anesthesia Evaluation  Patient identified by MRN, date of birth, ID band Patient awake    Reviewed: Allergy & Precautions, NPO status , Patient's Chart, lab work & pertinent test results  History of Anesthesia Complications Negative for: history of anesthetic complications  Airway Mallampati: II  TM Distance: >3 FB Neck ROM: Full    Dental  (+) Edentulous Upper, Dental Advisory Given   Pulmonary COPD,  COPD inhaler, former smoker,    Pulmonary exam normal        Cardiovascular hypertension, Pt. on home beta blockers and Pt. on medications + CAD, + Past MI and + DOE  Normal cardiovascular exam  Cath 2019 1. No significant CAD 2. Moderate to severely elevated LV filling pressures 3. Moderate pulmonary venous hypertension.  4. Normal cardiac output.     Neuro/Psych Anxiety Depression negative neurological ROS     GI/Hepatic Neg liver ROS, PUD, GERD  Medicated and Controlled,  Endo/Other  Hypothyroidism   Renal/GU Renal InsufficiencyRenal disease  negative genitourinary   Musculoskeletal negative musculoskeletal ROS (+)   Abdominal   Peds  Hematology negative hematology ROS (+)   Anesthesia Other Findings Day of surgery medications reviewed with patient.  Reproductive/Obstetrics negative OB ROS                            Anesthesia Physical  Anesthesia Plan  ASA: III  Anesthesia Plan: MAC   Post-op Pain Management:    Induction: Intravenous  PONV Risk Score and Plan: Ondansetron and Propofol infusion  Airway Management Planned: Natural Airway  Additional Equipment:   Intra-op Plan:   Post-operative Plan:   Informed Consent: I have reviewed the patients History and Physical, chart, labs and discussed the procedure including the risks, benefits and alternatives for the proposed anesthesia with the patient or authorized representative who has indicated his/her understanding  and acceptance.     Dental advisory given  Plan Discussed with: Anesthesiologist and CRNA  Anesthesia Plan Comments:        Anesthesia Quick Evaluation

## 2020-08-13 ENCOUNTER — Encounter (HOSPITAL_COMMUNITY): Payer: Self-pay | Admitting: Gastroenterology

## 2020-08-13 ENCOUNTER — Encounter (HOSPITAL_COMMUNITY)
Admission: RE | Disposition: A | Discharge status: Home / Self Care | Payer: Self-pay | Source: Home / Self Care | Attending: Gastroenterology

## 2020-08-13 ENCOUNTER — Ambulatory Visit (HOSPITAL_COMMUNITY)
Admission: RE | Admit: 2020-08-13 | Discharge: 2020-08-13 | Disposition: A | Discharge status: Home / Self Care | Payer: Medicare HMO | Attending: Gastroenterology | Admitting: Gastroenterology

## 2020-08-13 ENCOUNTER — Ambulatory Visit (HOSPITAL_COMMUNITY): Payer: Medicare HMO | Admitting: Anesthesiology

## 2020-08-13 ENCOUNTER — Other Ambulatory Visit: Payer: Self-pay

## 2020-08-13 DIAGNOSIS — K219 Gastro-esophageal reflux disease without esophagitis: Secondary | ICD-10-CM | POA: Insufficient documentation

## 2020-08-13 DIAGNOSIS — I10 Essential (primary) hypertension: Secondary | ICD-10-CM | POA: Diagnosis not present

## 2020-08-13 DIAGNOSIS — J449 Chronic obstructive pulmonary disease, unspecified: Secondary | ICD-10-CM | POA: Diagnosis not present

## 2020-08-13 DIAGNOSIS — Z87891 Personal history of nicotine dependence: Secondary | ICD-10-CM | POA: Insufficient documentation

## 2020-08-13 DIAGNOSIS — K449 Diaphragmatic hernia without obstruction or gangrene: Secondary | ICD-10-CM

## 2020-08-13 DIAGNOSIS — K2950 Unspecified chronic gastritis without bleeding: Secondary | ICD-10-CM | POA: Diagnosis not present

## 2020-08-13 DIAGNOSIS — N189 Chronic kidney disease, unspecified: Secondary | ICD-10-CM | POA: Insufficient documentation

## 2020-08-13 DIAGNOSIS — K222 Esophageal obstruction: Secondary | ICD-10-CM

## 2020-08-13 DIAGNOSIS — I2510 Atherosclerotic heart disease of native coronary artery without angina pectoris: Secondary | ICD-10-CM | POA: Diagnosis not present

## 2020-08-13 DIAGNOSIS — R1013 Epigastric pain: Secondary | ICD-10-CM

## 2020-08-13 DIAGNOSIS — I129 Hypertensive chronic kidney disease with stage 1 through stage 4 chronic kidney disease, or unspecified chronic kidney disease: Secondary | ICD-10-CM | POA: Insufficient documentation

## 2020-08-13 DIAGNOSIS — E039 Hypothyroidism, unspecified: Secondary | ICD-10-CM | POA: Diagnosis not present

## 2020-08-13 DIAGNOSIS — R1310 Dysphagia, unspecified: Secondary | ICD-10-CM

## 2020-08-13 HISTORY — PX: BIOPSY: SHX5522

## 2020-08-13 HISTORY — PX: SAVORY DILATION: SHX5439

## 2020-08-13 HISTORY — PX: ESOPHAGOGASTRODUODENOSCOPY (EGD) WITH PROPOFOL: SHX5813

## 2020-08-13 SURGERY — ESOPHAGOGASTRODUODENOSCOPY (EGD) WITH PROPOFOL
Anesthesia: Monitor Anesthesia Care

## 2020-08-13 MED ORDER — PROPOFOL 500 MG/50ML IV EMUL
INTRAVENOUS | Status: AC
  Filled 2020-08-13: qty 150

## 2020-08-13 MED ORDER — PROPOFOL 500 MG/50ML IV EMUL
INTRAVENOUS | Status: DC | PRN
  Administered 2020-08-13: 150 ug/kg/min via INTRAVENOUS

## 2020-08-13 MED ORDER — SODIUM CHLORIDE 0.9 % IV SOLN: INTRAVENOUS | Status: DC

## 2020-08-13 MED ORDER — PROPOFOL 500 MG/50ML IV EMUL
INTRAVENOUS | Status: AC
  Filled 2020-08-13: qty 50

## 2020-08-13 MED ORDER — LACTATED RINGERS IV SOLN: INTRAVENOUS | Status: DC

## 2020-08-13 SURGICAL SUPPLY — 14 items

## 2020-08-13 NOTE — Discharge Instructions (Signed)
YOU HAD AN ENDOSCOPIC PROCEDURE TODAY: Refer to the procedure report and other information in the discharge instructions given to you for any specific questions about what was found during the examination. If this information does not answer your questions, please call [place] office at [phone] to clarify.   YOU SHOULD EXPECT: Some feelings of bloating in the abdomen. Passage of more gas than usual. Walking can help get rid of the air that was put into your GI tract during the procedure and reduce the bloating. If you had a lower endoscopy (such as a colonoscopy or flexible sigmoidoscopy) you may notice spotting of blood in your stool or on the toilet paper. Some abdominal soreness may be present for a day or two, also.  DIET: Your first meal following the procedure should be a light meal and then it is ok to progress to your normal diet. A half-sandwich or bowl of soup is an example of a good first meal. Heavy or fried foods are harder to digest and may make you feel nauseous or bloated. Drink plenty of fluids but you should avoid alcoholic beverages for 24 hours. If you had a esophageal dilation, please see attached instructions for diet.    ACTIVITY: Your care partner should take you home directly after the procedure. You should plan to take it easy, moving slowly for the rest of the day. You can resume normal activity the day after the procedure however YOU SHOULD NOT DRIVE, use power tools, machinery or perform tasks that involve climbing or major physical exertion for 24 hours (because of the sedation medicines used during the test).   SYMPTOMS TO REPORT IMMEDIATELY: A gastroenterologist can be reached at any hour. Please call [phone]  for any of the following symptoms:   Following upper endoscopy (EGD, EUS, ERCP, esophageal dilation) Vomiting of blood or coffee ground material  New, significant abdominal pain  New, significant chest pain or pain under the shoulder blades  Painful or  persistently difficult swallowing  New shortness of breath  Black, tarry-looking or red, bloody stools  FOLLOW UP:  If any biopsies were taken you will be contacted by phone or by letter within the next 1-3 weeks. Call [phone]  if you have not heard about the biopsies in 3 weeks.  Please also call with any specific questions about appointments or follow up tests.  

## 2020-08-13 NOTE — Interval H&P Note (Signed)
History and Physical Interval Note:  08/13/2020 8:06 AM  Veronica Wolf  has presented today for surgery, with the diagnosis of dysphagia.  The various methods of treatment have been discussed with the patient and family. After consideration of risks, benefits and other options for treatment, the patient has consented to  Procedure(s): ESOPHAGOGASTRODUODENOSCOPY (EGD) WITH PROPOFOL (N/A) as a surgical intervention.  The patient's history has been reviewed, patient examined, no change in status, stable for surgery.  I have reviewed the patient's chart and labs.  Questions were answered to the patient's satisfaction.     [name]

## 2020-08-13 NOTE — Anesthesia Postprocedure Evaluation (Addendum)
Anesthesia Post Note  Patient: LAURAINE CRESPO  Procedure(s) Performed: ESOPHAGOGASTRODUODENOSCOPY (EGD) WITH PROPOFOL (N/A ) SAVORY DILATION (N/A ) BIOPSY     Patient location during evaluation: Endoscopy Anesthesia Type: MAC Level of consciousness: awake and alert Pain management: pain level controlled Vital Signs Assessment: post-procedure vital signs reviewed and stable Respiratory status: spontaneous breathing and respiratory function stable Cardiovascular status: stable Postop Assessment: no apparent nausea or vomiting Anesthetic complications: no   No complications documented.  Last Vitals:  Vitals:   08/13/20 0920 08/13/20 0930  BP: (!) 115/49 (!) 146/68  Pulse: 63 62  Resp: 20 16  Temp:    SpO2: 100% 96%    Last Pain:  Vitals:   08/13/20 0930  TempSrc:   PainSc: 0-No pain                 Jaray Boliver DANIEL

## 2020-08-13 NOTE — Transfer of Care (Signed)
Immediate Anesthesia Transfer of Care Note  Patient: Veronica Wolf  Procedure(s) Performed: ESOPHAGOGASTRODUODENOSCOPY (EGD) WITH PROPOFOL (N/A ) SAVORY DILATION (N/A ) BIOPSY  Patient Location: PACU  Anesthesia Type:MAC  Level of Consciousness: sedated and responds to stimulation  Airway & Oxygen Therapy: Patient Spontanous Breathing and Patient connected to face mask oxygen  Post-op Assessment: Report given to RN and Post -op Vital signs reviewed and stable  Post vital signs: Reviewed and stable  Last Vitals:  Vitals Value Taken Time  BP    Temp    Pulse    Resp    SpO2      Last Pain:  Vitals:   08/13/20 0823  TempSrc: Oral  PainSc: 0-No pain         Complications: No complications documented.

## 2020-08-13 NOTE — H&P (Signed)
    P  Chief Complaint:     GERD, epigastric pain, esophageal stricture   HPI:     Patient is a 80 y.o. female with a history of COPD, CKD, CAD with cardiac cath 2019,depression, HTN, HLD, allergic rhinitis, anxiety, presenting to [facility] for EGD for evaluation of known esophageal stricture, along with evaluation of epigastric pain and reflux symptoms.  Initially seen in the office 04/11/2020  W/ c/o MEG pain and reflux.  EGD on 04/24/2020 notable for severe stenosis in the proximal esophagus at the cricopharyngeus, which could not be traversed.  Subsequent esophagram on 05/13/2020 with moderate to severe stricture at the cervical/thoracic junction.  The remainder of the esophagus was otherwise normal aside from mild presbyesophagus.    Presented for EGD on 06/11/2020 at [facility], but due to chest pain in the preoperative procedure was canceled in favor of expedited ER evaluation.  Cardiac work-up was unremarkable and repeat CT abdomen/pelvis also unremarkable.  She presents today for EGD with small caliber ultraslim scope.     Review of systems:     No chest pain, no SOB, no fevers, no urinary sx   Past Medical History:  Diagnosis Date  . Allergic rhinitis   . Allergy    seasonal allergies  . Anxiety   . CKD (chronic kidney disease) 04/04/2017  . COPD (chronic obstructive pulmonary disease) (HCC)    uses inhaler  . Depression   . GERD (gastroesophageal reflux disease)   . Gout    hx of  . Hyperlipidemia    on meds  . Hypertension    on meds  . Myocardial infarction (HCC)   . Peptic ulcer 01/04/2003  . Vertigo     Patient's surgical history, family medical history, social history, medications and allergies were all reviewed in Epic    No current facility-administered medications for this encounter.    Physical Exam:     Ht 5\' 4"  (1.626 m)   Wt 90.7 kg   BMI 34.33 kg/m   GENERAL:  Pleasant female in NAD PSYCH: : Cooperative, normal affect EENT:   conjunctiva pink, mucous membranes moist, neck supple without masses CARDIAC:  RRR, no murmur heard, no peripheral edema PULM: Normal respiratory effort, lungs CTA bilaterally, no wheezing ABDOMEN:  Nondistended, soft, nontender. No obvious masses, no hepatomegaly,  normal bowel sounds SKIN:  turgor, no lesions seen Musculoskeletal:  Normal muscle tone, normal strength NEURO: Alert and oriented x 3, no focal neurologic deficits   IMPRESSION and PLAN:    1) Epigastric pain 2) GERD 3) Esophageal stricture  -Plan for EGD today with ultraslim scope with possible esophageal dilation, biopsies -Evaluation for PUD, gastritis  4) History of CAD -Continuing ASA 81 mg in perioperative period          Breyanna Valera ,DO, FACG 08/13/2020, 8:02 AM

## 2020-08-13 NOTE — Addendum Note (Signed)
Addendum  created 08/13/20 1218 by Landen Breeland, MD   Clinical Note Signed    

## 2020-08-13 NOTE — Op Note (Signed)
[facility] Patient Name: Veronica MellowLois Wolf Procedure Date: 08/13/2020 MRN: [id number] Attending MD: Doristine LocksVito Levonte Molina , MD Date of Birth: May 08, 1940 CSN: [id number] Age: [id number] Admit Type: Outpatient Procedure:                Upper GI endoscopy Indications:              Epigastric abdominal pain, Dysphagia, Suspected                            esophageal reflux, Abnormal esophagram, For therapy                            of esophageal stenosis Providers:                Doristine LocksVito Jayshon Dommer, MD, Fransisca ConnorsMichael Williams, Leanne LovelyAllison                            Townsend, Technician, Mirian MoKelley Carver, CRNA Referring MD:              Medicines:                Monitored Anesthesia Care Complications:            No immediate complications. Estimated Blood Loss:     Estimated blood loss was minimal. Procedure:                Pre-Anesthesia Assessment:                           - Prior to the procedure, a History and Physical                            was performed, and patient medications and                            allergies were reviewed. The patient's tolerance of                            previous anesthesia was also reviewed. The risks                            and benefits of the procedure and the sedation                            options and risks were discussed with the patient.                            All questions were answered, and informed consent                            was obtained. Prior Anticoagulants: The patient has                            taken no previous anticoagulant or antiplatelet                            agents. ASA  Grade Assessment: III - A patient with                            severe systemic disease. After reviewing the risks                            and benefits, the patient was deemed in                            satisfactory condition to undergo the procedure.                           After obtaining informed consent, the endoscope was                             passed under direct vision. Throughout the                            procedure, the patient's blood pressure, pulse, and                            oxygen saturations were monitored continuously. The                            [id number]-[id number] ([id number]) Olympus ultra slim endoscope                            was introduced through the mouth, and advanced to                            the second part of duodenum. The upper GI endoscopy                            was accomplished without difficulty. The patient                            tolerated the procedure well. Scope In: Scope Out: Findings:      One benign-appearing, intrinsic moderate stenosis was found 15 cm from       the incisors. This stenosis measured 7 mm (inner diameter) x 1 cm (in       length). The stenosis was traversed. A guidewire was placed and the       scope was withdrawn. Dilation was performed with a Savary dilator with       mild resistance at 8 mm, 9 mm, 11 mm and 12 mm. The dilation site was       examined following endoscope reinsertion and showed mild mucosal       disruption and moderate improvement in luminal narrowing. This was then       biopsied with a cold forceps for further fracturing of the stricture.       Estimated blood loss was minimal.      A 2 cm hiatal hernia was present. The remainder of the esophagus was       normal appearing.      The gastroesophageal flap valve was visualized  endoscopically and       classified as Hill Grade III (minimal fold, loose to endoscope, hiatal       hernia likely).      The entire examined stomach was normal. Biopsies were taken with a cold       forceps for Helicobacter pylori testing. Estimated blood loss was       minimal.      The examined duodenum was normal. Impression:               - Benign-appearing esophageal stenosis. Dilated                            with a 12 mm Savary dilator with appropriate                            mucosal rent,  consistent with successful dilation.                            The stricture was then further fractured using cold                            forceps.                           - 2 cm hiatal hernia.                           - Gastroesophageal flap valve classified as Hill                            Grade III (minimal fold, loose to endoscope, hiatal                            hernia likely).                           - Normal stomach. Biopsied.                           - Normal examined duodenum. Moderate Sedation:      Not Applicable - Patient had care per Anesthesia. Recommendation:           - Patient has a contact number available for                            emergencies. The signs and symptoms of potential                            delayed complications were discussed with the                            patient. Return to normal activities tomorrow.                            Written discharge instructions were provided to the  patient.                           - Soft diet today then advance slowly as tolerated                            tomorrow.                           - Continue present medications.                           - Await pathology results.                           - Repeat upper endoscopy in 4-6 weeks for                            retreatment. Again plan to schedule at [facility]. Procedure Code(s):        --- Professional ---                           [phone], Esophagogastroduodenoscopy, flexible,                            transoral; with insertion of guide wire followed by                            passage of dilator(s) through esophagus over guide                            wire                           [id number], 59, Esophagogastroduodenoscopy, flexible,                            transoral; with biopsy, single or multiple Diagnosis Code(s):        --- Professional ---                            K22.2, Esophageal obstruction                           K44.9, Diaphragmatic hernia without obstruction or                            gangrene                           R10.13, Epigastric pain                           R13.10, Dysphagia, unspecified  R93.3, Abnormal findings on diagnostic imaging of                            other parts of digestive tract CPT copyright 2019 American Medical Association. All rights reserved. The codes documented in this report are preliminary and upon coder review may  be revised to meet current compliance requirements. Doristine Locks, MD 08/13/2020 9:13:46 AM Number of Addenda: 0

## 2020-08-14 ENCOUNTER — Telehealth: Payer: Self-pay | Admitting: General Surgery

## 2020-08-14 LAB — SURGICAL PATHOLOGY

## 2020-08-14 NOTE — Telephone Encounter (Signed)
-----   Message from [place] V, DO sent at 08/14/2020  1:10 PM EDT ----- The biopsies taken from your stomach were notable for gastritis (inflammation), but there was no evidence of Helicobacter pylori infection.

## 2020-08-14 NOTE — Telephone Encounter (Signed)
Spoke with the patient and advised her of the results, patient verbalized understanding.

## 2020-08-18 ENCOUNTER — Other Ambulatory Visit: Payer: Self-pay

## 2020-08-18 ENCOUNTER — Ambulatory Visit (INDEPENDENT_AMBULATORY_CARE_PROVIDER_SITE_OTHER): Payer: Medicare HMO | Admitting: Family

## 2020-08-18 VITALS — BP 99/52 | HR 83 | Temp 98.2°F | Resp 16 | Ht 64.0 in | Wt 202.0 lb

## 2020-08-18 DIAGNOSIS — M109 Gout, unspecified: Secondary | ICD-10-CM | POA: Diagnosis not present

## 2020-08-18 DIAGNOSIS — N183 Chronic kidney disease, stage 3 unspecified: Secondary | ICD-10-CM

## 2020-08-18 DIAGNOSIS — F418 Other specified anxiety disorders: Secondary | ICD-10-CM

## 2020-08-18 DIAGNOSIS — E785 Hyperlipidemia, unspecified: Secondary | ICD-10-CM

## 2020-08-18 DIAGNOSIS — K219 Gastro-esophageal reflux disease without esophagitis: Secondary | ICD-10-CM

## 2020-08-18 DIAGNOSIS — I2510 Atherosclerotic heart disease of native coronary artery without angina pectoris: Secondary | ICD-10-CM

## 2020-08-18 DIAGNOSIS — E039 Hypothyroidism, unspecified: Secondary | ICD-10-CM | POA: Diagnosis not present

## 2020-08-18 LAB — TSH: TSH: 3.57 u[IU]/mL (ref 0.35–4.50)

## 2020-08-18 LAB — LIPID PANEL
Cholesterol: 204 mg/dL — ABNORMAL HIGH (ref 0–200)
HDL: 42.3 mg/dL (ref >39.00)
LDL Cholesterol: 125 mg/dL — ABNORMAL HIGH (ref 0–99)
NonHDL: 161.23
Total CHOL/HDL Ratio: 5
Triglycerides: 182 mg/dL — ABNORMAL HIGH (ref 0.0–149.0)
VLDL: 36.4 mg/dL (ref 0.0–40.0)

## 2020-08-18 LAB — COMPREHENSIVE METABOLIC PANEL
ALT: 7 U/L (ref 0–35)
AST: 12 U/L (ref 0–37)
Albumin: 3.8 g/dL (ref 3.5–5.2)
Alkaline Phosphatase: 80 U/L (ref 39–117)
BUN: 15 mg/dL (ref 6–23)
CO2: 27 mEq/L (ref 19–32)
Calcium: 8.6 mg/dL (ref 8.4–10.5)
Chloride: 106 mEq/L (ref 96–112)
Creatinine, Ser: 1.38 mg/dL — ABNORMAL HIGH (ref 0.40–1.20)
GFR: 35.98 mL/min — ABNORMAL LOW (ref >60.00)
Glucose, Bld: 66 mg/dL — ABNORMAL LOW (ref 70–99)
Potassium: 3.9 mEq/L (ref 3.5–5.1)
Sodium: 141 mEq/L (ref 135–145)
Total Bilirubin: 0.2 mg/dL (ref 0.2–1.2)
Total Protein: 6.6 g/dL (ref 6.0–8.3)

## 2020-08-18 MED ORDER — ASPIRIN 81 MG PO TBEC: 81.0000 mg | DELAYED_RELEASE_TABLET | Freq: Every day | ORAL | 12 refills | Status: DC

## 2020-08-18 NOTE — Assessment & Plan Note (Signed)
Lab Results  Component Value Date   TSH 1.77 03/14/2020

## 2020-08-18 NOTE — Assessment & Plan Note (Signed)
Add Aspirin 81mg , continue statin, arrange follow up with cardiology.

## 2020-08-18 NOTE — Assessment & Plan Note (Addendum)
Fair control on pepcid 20mg  bid/omeprazole.  Feels like it has improved since she had her endo with esophageal dilation. Management per GI.

## 2020-08-18 NOTE — Patient Instructions (Addendum)
Please start aspirin 81mg .

## 2020-08-18 NOTE — Progress Notes (Signed)
Subjective:   By signing my name below, I, [name], attest that this documentation has been prepared under the direction and in the presence of Sandford CrazeMelissa O'Sullivan NP. 08/18/2020      Patient ID: Veronica Wolf, female    DOB: 03-Aug-1940, 80 y.o.   MRN: [id number]  No chief complaint on file.   HPI Patient is in today for a office visit. She is doing well at this time.  She complains of burning pain since this morning in her upper abdomen.  She had a EDG last week on Aug 13, 2020.   Anxiety/Depression- She is doing finding mild relief on 20 mg Lexapro daily PO. She has issues fixing her car and finds commuting to church and other activities to be difficult. She is planning to have her grandson to drive her to church.  She also complains of being more anxious and jittery recently. This issues has affected her sleep. She takes 10 mg valium daily PO and finds relief.   GERD- She still has heart burn but mentions it has reduced since her most recent procedure. She does note some ongoing epigastric discomfort at times  Hyperlipidemia- She continues to take 40 mg Lipitor to manage her hyperlipidemia. Gout- She is managing her gout well on 200 mg allopurinol PO.  Hypothyroid- She continues taking 25 mg levothyroxine daily PO to manage her hypothyroid symptoms.  Hypertension- She takes an aspirin once a day to manage her hypertension. Her blood pressure was lower today compared to her last measurement. BP Readings from Last 3 Encounters:  08/18/20 (!) 99/52  08/13/20 (!) 146/68  06/11/20 119/67    Past Medical History:  Diagnosis Date  . Allergic rhinitis   . Allergy    seasonal allergies  . Anxiety   . CKD (chronic kidney disease) 04/04/2017  . COPD (chronic obstructive pulmonary disease) (HCC)    uses inhaler  . Depression   . GERD (gastroesophageal reflux disease)   . Gout    hx of  . Hyperlipidemia    on meds  . Hypertension    on meds  . Myocardial infarction (HCC)    . Peptic ulcer 01/04/2003  . Vertigo     Past Surgical History:  Procedure Laterality Date  . ABDOMINAL HYSTERECTOMY    . APPENDECTOMY    . BIOPSY  08/13/2020   Procedure: BIOPSY;  Surgeon: Shellia Cleverlyirigliano, Vito V, DO;  Location: [facility];  Service: Gastroenterology;;  . ESOPHAGOGASTRODUODENOSCOPY (EGD) WITH PROPOFOL N/A 08/13/2020   Procedure: ESOPHAGOGASTRODUODENOSCOPY (EGD) WITH PROPOFOL;  Surgeon: Shellia Cleverlyirigliano, Vito V, DO;  Location: [facility];  Service: Gastroenterology;  Laterality: N/A;  . LEFT HEART CATH AND CORONARY ANGIOGRAPHY N/A 06/13/2017   Procedure: LEFT HEART CATH AND CORONARY ANGIOGRAPHY;  Surgeon: Marykay LexHarding, David W, MD;  [facility];  Service: Cardiovascular;  Laterality: N/A;  . RIGHT/LEFT HEART CATH AND CORONARY ANGIOGRAPHY N/A 09/16/2017   Procedure: RIGHT/LEFT HEART CATH AND CORONARY ANGIOGRAPHY;  Surgeon: SwazilandJordan, Peter M, MD;  [facility];  Service: Cardiovascular;  Laterality: N/A;  . SAVORY DILATION N/A 08/13/2020   Procedure: SAVORY DILATION;  Surgeon: Shellia Cleverlyirigliano, Vito V, DO;  Location: [facility];  Service: Gastroenterology;  Laterality: N/A;  . ULTRASOUND GUIDANCE FOR VASCULAR ACCESS  09/16/2017   Procedure: Ultrasound Guidance For Vascular Access;  Surgeon: SwazilandJordan, Peter M, MD;  [facility];  Service: Cardiovascular;;  . WISDOM TOOTH EXTRACTION      Family History  Problem Relation Age  of Onset  . Breast cancer Mother   . Stroke Father   . Stomach cancer Neg Hx   . Colon cancer Neg Hx   . Pancreatic cancer Neg Hx   . Esophageal cancer Neg Hx   . Colon polyps Neg Hx   . Rectal cancer Neg Hx     Social History   Socioeconomic History  . Marital status: Widowed    Spouse name: Not on file  . Number of children: 1  . Years of education: Not on file  . Highest education level: Not on file  Occupational History  . Occupation: Retired  Tobacco Use  . Smoking status: Former Smoker    Quit date:  04/24/2000    Years since quitting: 20.3  . Smokeless tobacco: Never Used  Vaping Use  . Vaping Use: Never used  Substance and Sexual Activity  . Alcohol use: Not Currently    Comment: has previous hx of ETOH abuse, quit 2014  . Drug use: No  . Sexual activity: Never  Other Topics Concern  . 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 Worried About Programme researcher, broadcasting/film/video in the Last Year: Never true  . Ran Out of Food in the Last Year: Never true  Transportation Needs: No Transportation Needs  . Lack of Transportation (Medical): No  . Lack of Transportation (Non-Medical): No  Physical Activity: Not on file  Stress: Not on file  Social Connections: Not on file  Intimate Partner Violence: Not on file    Outpatient Medications Prior to Visit  Medication Sig Dispense Refill  . albuterol (PROVENTIL) (2.5 MG/3ML) 0.083% nebulizer solution USE 1 VIAL IN NEBULIZER EVERY 6 HOURS AS NEEDED FOR WHEEZING AND FOR SHORTNESS OF BREATH 150 mL 0  . allopurinol (ZYLOPRIM) 100 MG tablet Take 2 tablets (200 mg total) by mouth daily. 180 tablet 1  . atorvastatin (LIPITOR) 40 MG tablet Take 1 tablet (40 mg total) by mouth daily at 6 PM. 90 tablet 1  . colchicine 0.6 MG tablet Take 0.6 mg by mouth daily as needed (gout).    . diazepam (VALIUM) 10 MG tablet TAKE 1 TABLET BY MOUTH EVERY 12 HOURS AS NEEDED FOR ANXIETY (Patient taking differently: Take 10 mg by mouth every 12 (twelve) hours as needed for anxiety.) 45 tablet 0  . dicyclomine (BENTYL) 10 MG capsule Take 1 capsule (10 mg total) by mouth in the morning, at noon, in the evening, and at bedtime. (Patient taking differently: Take 10 mg by mouth daily.) 360 capsule 1  . escitalopram (LEXAPRO) 20 MG tablet Take 1 tablet (20 mg total) by mouth daily. 90  tablet 1  . famotidine (PEPCID) 20 MG tablet Take 1 tablet (20 mg total) by mouth 2 (two) times daily. (Patient taking differently: Take 20 mg by mouth 2 (two) times daily as needed for heartburn.) 60 tablet 3  . levothyroxine (EUTHYROX) 25 MCG tablet TAKE 1 TABLET BY MOUTH ONCE DAILY BEFORE BREAKFAST 90 tablet 1  . lisinopril (ZESTRIL) 2.5 MG tablet Take 1 tablet (2.5 mg total) by mouth daily. 90 tablet 1  . meclizine (ANTIVERT) 25 MG tablet Take 1 tablet (25 mg total) by mouth 2 (two) times daily as needed for dizziness. 30 tablet 1  .  metoprolol succinate (TOPROL-XL) 25 MG 24 hr tablet Take 0.5 tablets (12.5 mg total) by mouth daily. 45 tablet 0  . nitroGLYCERIN (NITROSTAT) 0.4 MG SL tablet Place 1 tablet (0.4 mg total) under the tongue every 5 (five) minutes as needed. 11 tablet 5  . nitroGLYCERIN (NITROSTAT) 0.4 MG SL tablet Place 0.4 mg under the tongue every 5 (five) minutes as needed for chest pain.    Marland Kitchen nystatin (NYSTATIN) powder APPLY  POWDER TOPICALLY TO AFFECTED AREA 2 TIMES DAILY (Patient taking differently: Apply 1 application topically 2 (two) times daily as needed (skin irritation).) 60 g 1  . pantoprazole (PROTONIX) 40 MG tablet Take 1 tablet by mouth twice daily (Patient taking differently: Take 40 mg by mouth 2 (two) times daily.) 180 tablet 0  . potassium chloride SA (KLOR-CON) 20 MEQ tablet Take 1 tablet (20 mEq total) by mouth daily. 90 tablet 1  . sucralfate (CARAFATE) 1 g tablet TAKE ONE TABLET BY MOUTH FOUR TIMES DAILY. MAY cut pill in half AND DISSOLVE in water prior TO drinking (Patient taking differently: Take 1 g by mouth every 4 (four) hours.) 120 tablet 0   No facility-administered medications prior to visit.    Allergies  Allergen Reactions  . Ibuprofen Other (See Comments)    Pt has hx of peptic ulcer and diverticulitis.     ROS     Objective:    Physical Exam Constitutional:      Appearance: Normal appearance.  HENT:     Head: Normocephalic and  atraumatic.     Right Ear: External ear normal.     Left Ear: External ear normal.  Eyes:     Extraocular Movements: Extraocular movements intact.     Pupils: Pupils are equal, round, and reactive to light.  Cardiovascular:     Rate and Rhythm: Normal rate and regular rhythm.     Pulses: Normal pulses.     Heart sounds: Normal heart sounds. No murmur heard. No friction rub. No gallop.   Pulmonary:     Effort: Pulmonary effort is normal. No respiratory distress.     Breath sounds: Normal breath sounds. No stridor. No wheezing, rhonchi or rales.  Abdominal:     Comments: Mild epigastric tenderness without guarding  Skin:    General: Skin is warm and dry.  Neurological:     Mental Status: She is alert and oriented to person, place, and time.  Psychiatric:        Behavior: Behavior normal.     There were no vitals taken for this visit. Wt Readings from Last 3 Encounters:  08/11/20 200 lb (90.7 kg)  06/11/20 198 lb 6.6 oz (90 kg)  06/05/20 199 lb (90.3 kg)    Diabetic Foot Exam - Simple   No data filed    Lab Results  Component Value Date   WBC 9.1 06/11/2020   HGB 11.5 (L) 06/11/2020   HCT 38.1 06/11/2020   PLT 219 06/11/2020   GLUCOSE 105 (H) 06/11/2020   CHOL 172 03/14/2020   TRIG 170 (H) 03/14/2020   HDL 35 (L) 03/14/2020   LDLCALC 108 (H) 03/14/2020   ALT 9 06/11/2020   AST 15 06/11/2020   NA 140 06/11/2020   K 4.4 06/11/2020   CL 113 (H) 06/11/2020   CREATININE 1.23 (H) 06/11/2020   BUN 17 06/11/2020   CO2 21 (L) 06/11/2020   TSH 1.77 03/14/2020   INR 1.11 09/16/2017   HGBA1C 6.1 06/12/2018    Lab Results  Component Value Date   TSH 1.77 03/14/2020   Lab Results  Component Value Date   WBC 9.1 06/11/2020   HGB 11.5 (L) 06/11/2020   HCT 38.1 06/11/2020   MCV 91.6 06/11/2020   PLT 219 06/11/2020   Lab Results  Component Value Date   NA 140 06/11/2020   K 4.4 06/11/2020   CO2 21 (L) 06/11/2020   GLUCOSE 105 (H) 06/11/2020   BUN 17 06/11/2020    CREATININE 1.23 (H) 06/11/2020   BILITOT 0.3 06/11/2020   ALKPHOS 64 06/11/2020   AST 15 06/11/2020   ALT 9 06/11/2020   PROT 6.7 06/11/2020   ALBUMIN 3.4 (L) 06/11/2020   CALCIUM 8.3 (L) 06/11/2020   ANIONGAP 6 06/11/2020   GFR 39.92 (L) 06/12/2018   Lab Results  Component Value Date   CHOL 172 03/14/2020   Lab Results  Component Value Date   HDL 35 (L) 03/14/2020   Lab Results  Component Value Date   LDLCALC 108 (H) 03/14/2020   Lab Results  Component Value Date   TRIG 170 (H) 03/14/2020   Lab Results  Component Value Date   CHOLHDL 4.9 03/14/2020   Lab Results  Component Value Date   HGBA1C 6.1 06/12/2018       Assessment & Plan:   Problem List Items Addressed This Visit   None      No orders of the defined types were placed in this encounter.   I, [name] NP, personally preformed the services described in this documentation.  All medical record entries made by the scribe were at my direction and in my presence.  I have reviewed the chart and discharge instructions (if applicable) and agree that the record reflects my personal performance and is accurate and complete. 08/18/2020   I,[name],acting as a Neurosurgeon for Lemont Fillers, NP.,have documented all relevant documentation on the behalf of Lemont Fillers, NP,as directed by  Lemont Fillers, NP while in the presence of Lemont Fillers, NP.   Shehryar [employer]

## 2020-08-18 NOTE — Assessment & Plan Note (Addendum)
Depression is Stable on lexapro- plans to try to get out more to help with her mood. Notes some increased anxiety during the day. Only taking valium HS. Advised pt to try taking a dose of valium in the AM.

## 2020-08-18 NOTE — Assessment & Plan Note (Signed)
Continue ACE inhibitor. Obtain follow up BUN/CR.

## 2020-08-18 NOTE — Assessment & Plan Note (Signed)
Continues allopurinol 100mg . Denies any recent gout flare.

## 2020-08-19 ENCOUNTER — Telehealth: Payer: Self-pay | Admitting: Family

## 2020-08-19 MED ORDER — ATORVASTATIN CALCIUM 80 MG PO TABS: 80.0000 mg | ORAL_TABLET | Freq: Every day | ORAL | 1 refills | Status: DC

## 2020-08-19 NOTE — Telephone Encounter (Signed)
Patient advised of results and medication dose increase. She verbalized understanding.

## 2020-08-19 NOTE — Telephone Encounter (Signed)
Cholesterol is above goal. Please increase atorvastatin from 40mg  to 80mg  once daily.

## 2020-08-20 ENCOUNTER — Encounter: Payer: Self-pay | Admitting: *Deleted

## 2020-08-20 ENCOUNTER — Other Ambulatory Visit: Payer: Self-pay | Admitting: *Deleted

## 2020-08-20 DIAGNOSIS — J449 Chronic obstructive pulmonary disease, unspecified: Secondary | ICD-10-CM

## 2020-08-20 NOTE — Patient Outreach (Signed)
[facility]) Care Management  [facility] Care Manager  08/20/2020   Veronica Wolf [birth year] [id number]  Subjective: Successful telephone outreach call to patient. HIPAA identifiers obtained. Patient reports that her EGD with esophagus dilation procedure went well. Patient explains that she is not having any difficulty with swallowing presently. Adding that she can eat or drink anything she wants and is back to eating normally. Patient denies any breathing difficulties from receiving anesthesia and reports that her COPD is currently well controlled with her prescribed medications. Patient does want assistance with obtaining transportation and would like to increase her social activities. Nurse will send a referral to community care coordinator for assistance with these issues. Patient would like to receive pharmacy outreaches from [name], PharmD and nurse sent the pharmacist a staff message to notify. Patient does report that her home environment is safe, she denies any needs for DME, and states she has not had any recent falls. Patient did not have any further questions or concerns today and did confirm that she has this nurse's contact number to call her if needed.   Encounter Medications:  Outpatient Encounter Medications as of 08/20/2020  Medication Sig Note  . albuterol (PROVENTIL) (2.5 MG/3ML) 0.083% nebulizer solution USE 1 VIAL IN NEBULIZER EVERY 6 HOURS AS NEEDED FOR WHEEZING AND FOR SHORTNESS OF BREATH   . allopurinol (ZYLOPRIM) 100 MG tablet Take 2 tablets (200 mg total) by mouth daily.   Marland Kitchen aspirin 81 MG EC tablet Take 1 tablet (81 mg total) by mouth daily. Swallow whole.   Marland Kitchen atorvastatin (LIPITOR) 80 MG tablet Take 1 tablet (80 mg total) by mouth daily.   . colchicine 0.6 MG tablet Take 0.6 mg by mouth daily as needed (gout).   . diazepam (VALIUM) 10 MG tablet TAKE 1 TABLET BY MOUTH EVERY 12 HOURS AS NEEDED FOR ANXIETY (Patient taking differently: Take 10 mg by mouth  every 12 (twelve) hours as needed for anxiety.)   . dicyclomine (BENTYL) 10 MG capsule Take 1 capsule (10 mg total) by mouth in the morning, at noon, in the evening, and at bedtime. (Patient taking differently: Take 10 mg by mouth daily.)   . escitalopram (LEXAPRO) 20 MG tablet Take 1 tablet (20 mg total) by mouth daily.   . famotidine (PEPCID) 20 MG tablet Take 1 tablet (20 mg total) by mouth 2 (two) times daily. (Patient taking differently: Take 20 mg by mouth 2 (two) times daily as needed for heartburn.)   . levothyroxine (EUTHYROX) 25 MCG tablet TAKE 1 TABLET BY MOUTH ONCE DAILY BEFORE BREAKFAST   . lisinopril (ZESTRIL) 2.5 MG tablet Take 1 tablet (2.5 mg total) by mouth daily.   . meclizine (ANTIVERT) 25 MG tablet Take 1 tablet (25 mg total) by mouth 2 (two) times daily as needed for dizziness.   . metoprolol succinate (TOPROL-XL) 25 MG 24 hr tablet Take 0.5 tablets (12.5 mg total) by mouth daily.   . nitroGLYCERIN (NITROSTAT) 0.4 MG SL tablet Place 0.4 mg under the tongue every 5 (five) minutes as needed for chest pain.   Marland Kitchen nystatin (NYSTATIN) powder APPLY  POWDER TOPICALLY TO AFFECTED AREA 2 TIMES DAILY (Patient taking differently: Apply 1 application topically 2 (two) times daily as needed (skin irritation).)   . pantoprazole (PROTONIX) 40 MG tablet Take 1 tablet by mouth twice daily (Patient taking differently: Take 40 mg by mouth 2 (two) times daily.)   . potassium chloride SA (KLOR-CON) 20 MEQ tablet Take 1 tablet (20 mEq  total) by mouth daily.   . sucralfate (CARAFATE) 1 g tablet TAKE ONE TABLET BY MOUTH FOUR TIMES DAILY. MAY cut pill in half AND DISSOLVE in water prior TO drinking (Patient taking differently: Take 1 g by mouth every 4 (four) hours.)   . nitroGLYCERIN (NITROSTAT) 0.4 MG SL tablet Place 1 tablet (0.4 mg total) under the tongue every 5 (five) minutes as needed. 06/03/2020: On hold--patient needs refills authorized   No facility-administered encounter medications on file as of  08/20/2020.    Functional Status:  In your present state of health, do you have any difficulty performing the following activities: 10/09/2019  Hearing? N  Vision? N  Difficulty concentrating or making decisions? N  Walking or climbing stairs? N  Dressing or bathing? N  Doing errands, shopping? N  Preparing Food and eating ? N  Using the Toilet? N  In the past six months, have you accidently leaked urine? N  Do you have problems with loss of bowel control? N  Managing your Medications? N  Managing your Finances? N  Housekeeping or managing your Housekeeping? N  Some recent data might be hidden    Fall/Depression Screening: Fall Risk  08/20/2020 08/18/2020 07/31/2020  Falls in the past year? 0 0 0  Number falls in past yr: 0 0 0  Injury with Fall? 0 0 0  Risk for fall due to : Impaired mobility;Impaired balance/gait - Impaired mobility;Impaired balance/gait;History of fall(s)  Follow up Falls prevention discussed;Education provided;Falls evaluation completed - Falls prevention discussed;Education provided;Falls evaluation completed   PHQ 2/9 Scores 08/18/2020 07/31/2020 10/09/2019 12/26/2017 10/12/2017 09/26/2017  PHQ - 2 Score 4 1 0 3 0 1  PHQ- 9 Score 10 - - 11 - -    Assessment:  Goals Addressed            This Visit's Progress   . COMPLETED: [facility]) Patient will verbalize maintaining nutrition while experiencing dysphagia for the next 90 days       Timeframe:  Long-Range Goal Priority:  High Start Date: 07/31/20                           Expected End Date: 08/20/20 Follow Up Date: 08/20/20  -Discussed drinking supplements to help maintain nutrition -Encouraged patient to continue to drink plenty of water and fluid -Discussed eating soft foods that are easy to swallow   Notes: Patient states she has had swallowing difficulties for a few months. She is being followed by Dr. Barron Alvine and will have a her esophagus dilated on 08/13/20. Patient has been eating soft foods and drinking  plenty of water and fluids. Nurse discussed with patient about drinking nutritional supplements and nurse will send her Ensure coupons.      Updated 518/22: Patient reports that the EGD with esophagus dilation  was successful. Adding that she is not having any difficulty swallowing whatever she wants to eat or drink.                    Marland Kitchen [facility]) Track and Manage My Symptoms       Timeframe:  Long-Range Goal Priority:  High Start Date: 01/04/20                            Expected End Date: 02/01/21  Follow Up Date 11/01/20   - develop a rescue plan - eliminate symptom triggers at home - follow rescue plan if symptoms flare-up - keep follow-up appointments    Why is this important?   Tracking your symptoms and other information about your health helps your doctor plan your care.  Write down the symptoms, the time of day, what you were doing and what medicine you are taking.  You will soon learn how to manage your symptoms.     Notes: Updated 07/29/20: Patient reports that her COPD is currently well controlled.  Updated 08/20/20: Patient reports that she did not experience any breathing difficulties after her EGD procedure. Continues to report that her COPD is under control.    Marland Kitchen [facility] Eye Exam       Timeframe:  Long-Range Goal Priority:  Medium Start Date:  08/20/20                           Expected End Date: 12/02/20                     Follow Up Date 12/02/20    - schedule appointment with eye doctor    Why is this important?    Eye check-ups are important when you have diabetes.   Vision loss can be prevented.    Notes: Patient reports that it has been several years since her last eye exam. Nurse called [facility] as a 3 way call with the patient to ensure that they do take [facility]. Nurse provided the patient with their office number and address and the patient states she will call then to make an appointment.   [phone]/ [address] [place], [place] [address].    . ([facility])Track and Manage My Triggers       Timeframe:  Long-Range Goal Priority:  High Start Date: 01/04/20                            Expected End Date: 02/01/21                      Follow Up Date: 11/01/20   - identify and remove indoor air pollutants - limit outdoor activity during cold weather    Why is this important?   Triggers are activities or things, like tobacco smoke or cold weather, that make your COPD (chronic obstructive pulmonary disease) flare-up.  Knowing these triggers helps you plan how to stay away from them.  When you cannot remove them, you can learn how to manage them.     Notes: Updated 07/31/20: Patient states that her home environment is safe and that currently her COPD is well controlled. Updated 08/20/20: Patient reports no breathing difficulties after her EGD procedure.       Plan: RN Health Coach will send PCP a quarterly update, will send referral to community care coordinator, will send patient advance directive documents, and will call patient within the month of July. Follow-up:  Patient agrees to Care Plan and Follow-up.   Blanchie Serve RN, BSN [facility] Care Management  RN Health Coach [phone] [email or web address]

## 2020-08-20 NOTE — Telephone Encounter (Signed)
Patient would like medication to go to  [facility] - [place], [place] - [address] Phone:  [phone]  Fax:  [phone]      I

## 2020-08-20 NOTE — Patient Instructions (Addendum)
Goals Addressed            This Visit's Progress   . COMPLETED: [facility]) Patient will verbalize maintaining nutrition while experiencing dysphagia for the next 90 days       Timeframe:  Long-Range Goal Priority:  High Start Date: 07/31/20                           Expected End Date: 08/20/20 Follow Up Date: 08/20/20  -Discussed drinking supplements to help maintain nutrition -Encouraged patient to continue to drink plenty of water and fluid -Discussed eating soft foods that are easy to swallow   Notes: Patient states she has had swallowing difficulties for a few months. She is being followed by Dr. Barron Alvine and will have a her esophagus dilated on 08/13/20. Patient has been eating soft foods and drinking plenty of water and fluids. Nurse discussed with patient about drinking nutritional supplements and nurse will send her Ensure coupons.      Updated 518/22: Patient reports that the EGD with esophagus dilation  was successful. Adding that she is not having any difficulty swallowing whatever she wants to eat or drink.                    Marland Kitchen [facility]) Track and Manage My Symptoms       Timeframe:  Long-Range Goal Priority:  High Start Date: 01/04/20                            Expected End Date: 02/01/21                      Follow Up Date 11/01/20   - develop a rescue plan - eliminate symptom triggers at home - follow rescue plan if symptoms flare-up - keep follow-up appointments    Why is this important?   Tracking your symptoms and other information about your health helps your doctor plan your care.  Write down the symptoms, the time of day, what you were doing and what medicine you are taking.  You will soon learn how to manage your symptoms.     Notes: Updated 07/29/20: Patient reports that her COPD is currently well controlled.  Updated 08/20/20: Patient reports that she did not experience any breathing difficulties after her EGD procedure. Continues to report that her COPD is under  control.    Marland Kitchen [facility] Eye Exam       Timeframe:  Long-Range Goal Priority:  Medium Start Date:  08/20/20                           Expected End Date: 12/02/20                     Follow Up Date 12/02/20    - schedule appointment with eye doctor    Why is this important?    Eye check-ups are important when you have diabetes.   Vision loss can be prevented.    Notes: Patient reports that it has been several years since her last eye exam. Nurse called [facility] as a 3 way call with the patient to ensure that they do take [facility]. Nurse provided the patient with their office number and address and the patient states she will call then to make an appointment.  [phone]/ [address] [place], [place] [id number].    . ([facility])Track and Manage My Triggers       Timeframe:  Long-Range Goal Priority:  High Start Date: 01/04/20                            Expected End Date: 02/01/21                      Follow Up Date: 11/01/20   - identify and remove indoor air pollutants - limit outdoor activity during cold weather    Why is this important?   Triggers are activities or things, like tobacco smoke or cold weather, that make your COPD (chronic obstructive pulmonary disease) flare-up.  Knowing these triggers helps you plan how to stay away from them.  When you cannot remove them, you can learn how to manage them.     Notes: Updated 07/31/20: Patient states that her home environment is safe and that currently her COPD is well controlled. Updated 08/20/20: Patient reports no breathing difficulties after her EGD procedure.        Pursed Lip Breathing Pursed lip breathing is a technique to relieve the feeling of being short of breath. Some long-term respiratory conditions, such as chronic obstructive pulmonary disease (COPD) and severe asthma, can make it hard to breathe out (exhale) all the air in your lungs. This can cause air that has less  oxygen than normal to build up in your lungs (air trapping). Trapped air means your lungs fill with less fresh air when you breathe in, or inhale. As a result, you feel short of breath. Pursed lip breathing keeps your airways open longer when you exhale and empties more air from your lungs. This makes more space for fresh air when you inhale. Pursed lip breathing can also slow down your breathing and keep your body from having to work so hard to breathe. Over time, pursed lip breathing may help you be able to be more physically active and do more activities. How to perform pursed lip breathing Being short of breath can make you tense and anxious. Before you start this breathing exercise, take a minute to relax your shoulders and close your eyes. Then: Start the exercise by closing your mouth. Breathe in through your nose, taking a normal breath. You can do this at your normal rate of breathing. If you feel you are not getting enough air, breathe in while slowly counting to 2 or 3. Pucker (purse) your lips as if you were going to whistle. Gently tighten the muscles of your abdomenor press on your abdomen to help push the air out. Breathe out slowly through your pursed lips. Take at least twice as long to breathe out as it takes you to breathe in. Make sure that you breathe out all of the air, but do not force air out. Repeat the exercise until your breathing improves. Ask your health care provider how often and how long to do this exercise.   Follow these instructions at home: Take over-the-counter and prescription medicines only as told by your health care provider. Return to your normal activities as told by your health care provider. Ask your health care provider what activities are safe for you. Do not use any products that contain nicotine or tobacco, such as cigarettes, e-cigarettes, and chewing tobacco. If you need help quitting, ask your health care provider. Keep all follow-up visits as told  by your health care provider. This is important. Where to find more information American Lung Association: www.lung.org Contact a health care provider if: Your shortness of breath gets worse. You become less able to exercise or be active. You develop a cough. You develop a fever. You experience problems with this breathing technique. Get help right away if: You are struggling to breathe. Your shortness of breath prevents you from doing any activity. These symptoms may represent a serious problem that is an emergency. Do not wait to see if the symptoms will go away. Get medical help right away. Call your local emergency services (911 in the [place] Do not drive yourself to the hospital. Summary Pursed lip breathing is a breathing technique that helps to remove trapped air from your lungs. This technique helps you get more oxygen into your lungs. Pursed lip breathing can help slow down your breathing and keeps your body from having to work so hard to breathe. Over time, pursed lip breathing may help you be able to be more physically active. When performing this technique, take at least twice as long to breathe out as it takes you to breathe in. Ask your health care provider how often and how long you should do pursed lip breathing. This information is not intended to replace advice given to you by your health care provider. Make sure you discuss any questions you have with your health care provider. Document Revised: 03/05/2019 Document Reviewed: 03/06/2019 Elsevier Patient Education  2021 Elsevier Inc.  Chronic Obstructive Pulmonary Disease  Chronic obstructive pulmonary disease (COPD) is a long-term (chronic) lung problem. When you have COPD, it is hard for air to get in and out of your lungs. Usually the condition gets worse over time, and your lungs will never return to normal. There are things you can do to keep yourself as healthy as possible. What are the causes?  Smoking. This is  the most common cause.  Certain genes passed from parent to child (inherited). What increases the risk?  Being exposed to secondhand smoke from cigarettes, pipes, or cigars.  Being exposed to chemicals and other irritants, such as fumes and dust in the work environment.  Having chronic lung conditions or infections. What are the signs or symptoms?  Shortness of breath, especially during physical activity.  A long-term cough with a large amount of thick mucus. Sometimes, the cough may not have any mucus (dry cough).  Wheezing.  Breathing quickly.  Skin that looks gray or blue, especially in the fingers, toes, or lips.  Feeling tired (fatigue).  Weight loss.  Chest tightness.  Having infections often.  Episodes when breathing symptoms become much worse (exacerbations). At the later stages of this disease, you may have swelling in the ankles, feet, or legs. How is this treated?  Taking medicines.  Quitting smoking, if you smoke.  Rehabilitation. This includes steps to make your body work better. It may involve a team of specialists.  Doing exercises.  Making changes to your diet.  Using oxygen.  Lung surgery.  Lung transplant.  Comfort measures (palliative care). Follow these instructions at home: Medicines  Take over-the-counter and prescription medicines only as told by your doctor.  Talk to your doctor before taking any cough or allergy medicines. You may need to avoid medicines that cause your lungs to be dry. Lifestyle  If you smoke, stop smoking. Smoking makes the problem worse.  Do not smoke or use any products that contain nicotine or tobacco. If you need help  quitting, ask your doctor.  Avoid being around things that make your breathing worse. This may include smoke, chemicals, and fumes.  Stay active, but remember to rest as well.  Learn and use tips on how to manage stress and control your breathing.  Make sure you get enough sleep. Most  adults need at least 7 hours of sleep every night.  Eat healthy foods. Eat smaller meals more often. Rest before meals. Controlled breathing Learn and use tips on how to control your breathing as told by your doctor. Try:  Breathing in (inhaling) through your nose for 1 second. Then, pucker your lips and breath out (exhale) through your lips for 2 seconds.  Putting one hand on your belly (abdomen). Breathe in slowly through your nose for 1 second. Your hand on your belly should move out. Pucker your lips and breathe out slowly through your lips. Your hand on your belly should move in as you breathe out.   Controlled coughing Learn and use controlled coughing to clear mucus from your lungs. Follow these steps: 1. Lean your head a little forward. 2. Breathe in deeply. 3. Try to hold your breath for 3 seconds. 4. Keep your mouth slightly open while coughing 2 times. 5. Spit any mucus out into a tissue. 6. Rest and do the steps again 1 or 2 times as needed. General instructions  Make sure you get all the shots (vaccines) that your doctor recommends. Ask your doctor about a flu shot and a pneumonia shot.  Use oxygen therapy and pulmonary rehabilitation if told by your doctor. If you need home oxygen therapy, ask your doctor if you should buy a tool to measure your oxygen level (oximeter).  Make a COPD action plan with your doctor. This helps you to know what to do if you feel worse than usual.  Manage any other conditions you have as told by your doctor.  Avoid going outside when it is very hot, cold, or humid.  Avoid people who have a sickness you can catch (contagious).  Keep all follow-up visits. Contact a doctor if:  You cough up more mucus than usual.  There is a change in the color or thickness of the mucus.  It is harder to breathe than usual.  Your breathing is faster than usual.  You have trouble sleeping.  You need to use your medicines more often than usual.  You  have trouble doing your normal activities such as getting dressed or walking around the house. Get help right away if:  You have shortness of breath while resting.  You have shortness of breath that stops you from: ? Being able to talk. ? Doing normal activities.  Your chest hurts for longer than 5 minutes.  Your skin color is more blue than usual.  Your pulse oximeter shows that you have low oxygen for longer than 5 minutes.  You have a fever.  You feel too tired to breathe normally. These symptoms may represent a serious problem that is an emergency. Do not wait to see if the symptoms will go away. Get medical help right away. Call your local emergency services (911 in the [place] Do not drive yourself to the hospital. Summary  Chronic obstructive pulmonary disease (COPD) is a long-term lung problem.  The way your lungs work will never return to normal. Usually the condition gets worse over time. There are things you can do to keep yourself as healthy as possible.  Take over-the-counter and prescription medicines only  as told by your doctor.  If you smoke, stop. Smoking makes the problem worse. This information is not intended to replace advice given to you by your health care provider. Make sure you discuss any questions you have with your health care provider. Document Revised: 01/29/2020 Document Reviewed: 01/29/2020 Elsevier Patient Education  2021 [employer].

## 2020-08-20 NOTE — Telephone Encounter (Signed)
Patient advised [facility] had already [facility] the medication to her if she wants it at [facility] will need to [facility]. Patient confirmed he already received shipment and she will just keep it. No need to send medication to [facility]

## 2020-08-21 ENCOUNTER — Telehealth: Payer: Self-pay

## 2020-08-21 NOTE — Telephone Encounter (Signed)
   Telephone encounter was:  Successful.  08/21/2020 Name: Veronica Wolf MRN: [id number] DOB: 1941-02-29  Veronica Wolf is a 80 y.o. year old female who is a primary care patient of Veronica Craze, NP . The community resource team was consulted for assistance with transportation and senior activities  Care guide performed the following interventions: Patient provided with information about care guide support team and interviewed to confirm resource needs  Spoke with patient gave her information to contact for transportation [employer] Access and Scientist, physiological of [place].  Also gave information for [facility] which is down the street from her home..  Follow Up Plan:  Care guide will follow up with patient by phone over the next 7 days  Maryum Batterson, AAS Paralegal, [facility] Care Guide . Embedded Care Coordination [facility]  Care Management  [address] [place], [place] [address] ??[email or web address]  ?? [phone]   [email or web address]

## 2020-08-22 ENCOUNTER — Telehealth: Payer: Self-pay

## 2020-08-22 NOTE — Telephone Encounter (Signed)
   Telephone encounter was:  Successful.  08/22/2020 Name: Veronica Wolf MRN: [id number] DOB: [birth year]  Veronica Wolf is a 80 y.o. year old female who is a primary care patient of [name], NP . The community resource team was consulted for assistance with Transportation Needs  and senior activities  Care guide performed the following interventions: Follow up call placed to the patient to discuss status of referral Spoke with patient gave her the number for the [facility] again. She has the numbers for [employer]. Patient stated she does not need any further assistance at this time. .  Follow Up Plan:  No further follow up planned at this time. The patient has been provided with needed resources.  Nazarene Bunning, AAS Paralegal, [facility] Care Guide . Embedded Care Coordination [facility] Health  Care Management  [address] [place], [place] [address] ??[email or web address]  ?? [phone]   [email or web address]

## 2020-09-02 ENCOUNTER — Telehealth: Payer: Self-pay | Admitting: Pharmacist

## 2020-09-02 ENCOUNTER — Ambulatory Visit (INDEPENDENT_AMBULATORY_CARE_PROVIDER_SITE_OTHER): Payer: Medicare HMO | Admitting: Pharmacist

## 2020-09-02 ENCOUNTER — Ambulatory Visit: Payer: Medicare HMO | Admitting: Family

## 2020-09-02 DIAGNOSIS — R1310 Dysphagia, unspecified: Secondary | ICD-10-CM

## 2020-09-02 DIAGNOSIS — I2510 Atherosclerotic heart disease of native coronary artery without angina pectoris: Secondary | ICD-10-CM | POA: Diagnosis not present

## 2020-09-02 DIAGNOSIS — E039 Hypothyroidism, unspecified: Secondary | ICD-10-CM | POA: Diagnosis not present

## 2020-09-02 DIAGNOSIS — F418 Other specified anxiety disorders: Secondary | ICD-10-CM

## 2020-09-02 DIAGNOSIS — E785 Hyperlipidemia, unspecified: Secondary | ICD-10-CM

## 2020-09-02 DIAGNOSIS — M109 Gout, unspecified: Secondary | ICD-10-CM

## 2020-09-02 MED ORDER — DIAZEPAM 10 MG PO TABS: 10.0000 mg | ORAL_TABLET | Freq: Two times a day (BID) | ORAL | 0 refills | Status: DC | PRN

## 2020-09-02 NOTE — Telephone Encounter (Signed)
Please fax rx to [facility].

## 2020-09-02 NOTE — Patient Instructions (Signed)
Visit Information Veronica Wolf,   It was a pleasure speaking with you today. Please feel free to contact me if you have any questions or concerns. Below is information regarding you health goals.   [name], PharmD Clinical Pharmacist [facility] [phone]   PATIENT GOALS: Goals Addressed            This Visit's Progress   . Chronic Care Management Pharmacy Care Plan   On track    CARE PLAN ENTRY (see longitudinal plan of care for additional care plan information)  Current Barriers:  . Chronic Disease Management support, education, and care coordination needs related to Hyperlipidemia/Hx of MI, Pre-Diabetes, Hypothyroidism, Depression/Anxiety, GERD, Gout    Hypertension Screening BP Readings from Last 3 Encounters:  08/18/20 (!) 99/52  08/13/20 (!) 146/68  06/11/20 119/67   . Pharmacist Clinical Goal(s): o Over the next 90 days, patient will work with PharmD and providers to maintain BP goal <130/80 . Current regimen:  . Metoprolol Succinate 25mg  0.5 tab daily . Lisinopril 2.5mg  daily . Interventions: o Discussed blood pressure goal o Consider purchasing blood pressure cuff for home use and check blood pressure 2 to 3 times per week and record . Patient self care activities - Over the next 90 days, patient will: o Maintain blood pressure less than 130/80 o Consider purchasing blood pressure cuff and check blood pressure 2 to 3 times per week and record  Hyperlipidemia/Hx of MI Lab Results  Component Value Date/Time   LDLCALC 125 (H) 08/18/2020 12:03 PM   LDLCALC 108 (H) 03/14/2020 02:39 PM   . Pharmacist Clinical Goal(s): o Over the next 90 days, patient will work with PharmD and providers to maintain LDL goal < 70 . Current regimen:  . Atorvastatin 80mg  daily  . Aspirin 81mg  daily  . Metoprolol succinate ER 25mg  - take 0.5 tablet daily . Interventions: o Discussed LDL goal and importance of medication adherence o Repeat  LDL at next office visit since dose of atorvastatin was recently increased . Patient self care activities - Over the next 90 days, patient will: o Maintain cholesterol medication regimen.  o Start taking aspirin 81mg  daily per Melissa O'Sullivan's office visit 08/18/2020  Anxiety / Depression:  . Pharmacist Clinical Goal(s): o Over the next 45 days, patient will work with PharmD and providers to maintain control of anxiety and depression symptoms . Current regimen:  . Escitalopram 20mg  daily  . Diazepam 10mg  take 1 tablet every 12 hours if needed for anxiety . Interventions: o Coordinated with pharmacy and provider for refills on escitalopram and diazepam;  . Patient self care activities - Over the next 90 days, patient will: o Maintain current medication regimen for anxiety and depression   Gout:  . Pharmacist Clinical Goal(s): o Over the next 90 days, patient will work with PharmD and providers to prevent acute gout episodes / lower uric acid . Current regimen:  . Allopurinol 100mg  - take 2 tablets = 200mg  daily (prevention) . Colchicine 0.6mg  take up to twice a day as needed for gout attack . Interventions: o Discussed difference between maintenance / presentation medication and treatment . Patient self care activities - Over the next 90 days, patient will: o Maintain current medication regimen for gout  Pre-Diabetes Lab Results  Component Value Date/Time   HGBA1C 6.1 06/12/2018 01:58 PM   HGBA1C 6.1 (H) 09/15/2017 03:20 AM   . Pharmacist Clinical Goal(s): o Over the next 90 days, patient will work with  PharmD and providers to maintain A1c goal <6.5% . Current regimen:  o Diet and exercise management   . Interventions: o Discussed the importance of limiting carbohydrates (30-45 grams per meal) . Patient self care activities - Over the next 90 days, patient will: o Maintain a1c <6.5%  Hypothyroidism . Pharmacist Clinical Goal(s) o Over the next 90 days, patient will work  with PharmD and providers to maintain TSH within normal limits and reduce risk of symptoms associated with hypothyroidism . Current regimen:  o Euthyrox daily . Interventions: o Discussed importance of medication adherence o Reminded to separate Euthyrox / levothyroxine dose by 30 minutes from breakfast and morning medications . Patient self care activities - Over the next 90 days, patient will: o Maintain hypothyroidism medication regimen  Medication management . Pharmacist Clinical Goal(s): o Over the next 90 days, patient will work with PharmD and providers to achieve optimal medication adherence . Current [facility]: [facility] to [facility] . Interventions o Comprehensive medication review performed. o Utilize [facility] pharmacy for medication synchronization, packaging and delivery o Patient having difficulty swallowing atorvastatin but is halving tablets. She asked if she could get her pharmacy to deliver a pill cutter with her next deliver. Will coordinate with there pharmacy . Patient self care activities - Over the next 90 days, patient will: o Focus on medication adherence by filling and taking medications appropriately  o Take medications as prescribed o Report any questions or concerns to PharmD and/or provider(s) o Contact Wynelle Dreier, PharmD if any issues with cost of medications or difficulty swallowing tablets.   Please see past updates related to this goal by clicking on the "Past Updates" button in the selected goal         The patient verbalized understanding of instructions, educational materials, and care plan provided today and agreed to receive a mailed copy of patient instructions, educational materials, and care plan.   The care management team will reach out to the patient again over the next 90 days.

## 2020-09-02 NOTE — Telephone Encounter (Signed)
rx faxed

## 2020-09-02 NOTE — Chronic Care Management (AMB) (Signed)
Chronic Care Management Pharmacy Note  09/02/2020 Name:  Veronica Wolf MRN:  [id number] DOB:  09-Aug-1940  Subjective: Veronica Wolf is an 79 y.o. year old female who is a primary patient of Debbrah Alar, NP.  The CCM team was consulted for assistance with disease management and care coordination needs.    Engaged with patient by telephone for follow up visit in response to provider referral for pharmacy case management and/or care coordination services.   Consent to Services:  The patient was given information about Chronic Care Management services, agreed to services, and gave verbal consent prior to initiation of services.  Please see initial visit note for detailed documentation.   Patient Care Team: Debbrah Alar, NP as PCP - General (Internal Medicine) Revankar, Reita Cliche, MD as PCP - Cardiology (Cardiology) Michiel Cowboy, RN as [place] Management Cherre Robins, PharmD (Pharmacist)  Recent office visits: 08/18/2020 - PCP Inda Castle) - follow up chronic conditions. Atorvastatin increased to 57m daily after LDL checked and found to be 125; Also added aspirin 875mdaily due to h/o MI.  Recent consult visits: [facility] - see below [facility] - ED Visit for epigastric pain. Has esophageal stricture Plan to get CBC, CMP, troponin x2, CT abdomen pelvis. Will also      contact her GI doctor regarding recommendations. CT abdomen pelvis unremarkable. Troponin negative x2. Discussed with Dr.                    CiBryan Lemmaher GI doctor).He states that if the work-up is negative, he plans to follow-up with her next week for a esophageal dilation and      look for stomach ulcer. Prescribed metoclopramide 1052m6 hours as needed   Objective:  Lab Results  Component Value Date   CREATININE 1.38 (H) 08/18/2020   CREATININE 1.23 (H) 06/11/2020   CREATININE  1.45 (H) 03/14/2020    Lab Results  Component Value Date   HGBA1C 6.1 06/12/2018   Last diabetic Eye exam: No results found for: HMDIABEYEEXA  Last diabetic Foot exam: No results found for: HMDIABFOOTEX      Component Value Date/Time   CHOL 204 (H) 08/18/2020 1203   TRIG 182.0 (H) 08/18/2020 1203   HDL 42.30 08/18/2020 1203   CHOLHDL 5 08/18/2020 1203   VLDL 36.4 08/18/2020 1203   LDLCALC 125 (H) 08/18/2020 1203   LDLCALC 108 (H) 03/14/2020 1439    Hepatic Function Latest Ref Rng & Units 08/18/2020 06/11/2020 03/14/2020  Total Protein 6.0 - 8.3 g/dL 6.6 6.7 6.9  Albumin 3.5 - 5.2 g/dL 3.8 3.4(L) -  AST 0 - 37 U/L _0 ALT 0 - 35 U/L _1 Alk Phosphatase 39 - 117 U/L 80 64 -  Total Bilirubin 0.2 - 1.2 mg/dL 0.2 0.3 0.4    Lab Results  Component Value Date/Time   TSH 3.57 08/18/2020 12:03 PM   TSH 1.77 03/14/2020 02:39 PM   FREET4 0.85 01/24/2018 03:04 PM    CBC Latest Ref Rng & Units 06/11/2020 01/23/2018 11/28/2017  WBC 4.0 - 10.5 K/uL 9.1 8.3 8.7  Hemoglobin 12.0 - 15.0 g/dL 11.5(L) 11.9(L) 12.3  Hematocrit 36.0 - 46.0 % 38.1 36.6 38.8  Platelets 150 - 400 K/uL 219 190.0 185    No results found for: VD25OH  Clinical ASCVD: Yes  The ASCVD  Risk score Mikey Bussing DC Jr., et al., 2013) failed to calculate for the following reasons:   The 2013 ASCVD risk score is only valid for ages 61 to 40   The patient has a prior MI or stroke diagnosis    Other:  DEXA 03/05/2014             AP LUMBAR SPINE not used for calculation due to significant degenerative changes.             Left FOREARM (1/3 RADIUS) T Score: -0.4             Left FEMUR neck T-Score: -0.7   Social History   Tobacco Use  Smoking Status Former Smoker  . Quit date: 04/24/2000  . Years since quitting: 20.3  Smokeless Tobacco Never Used   BP Readings from Last 3 Encounters:  08/18/20 (!) 99/52  08/13/20 (!) 146/68  06/11/20 119/67   Pulse Readings from Last 3 Encounters:  08/18/20 83  08/13/20  62  06/11/20 (!) 58   Wt Readings from Last 3 Encounters:  08/18/20 202 lb (91.6 kg)  08/11/20 200 lb (90.7 kg)  06/11/20 198 lb 6.6 oz (90 kg)    Assessment: Review of patient past medical history, allergies, medications, health status, including review of consultants reports, laboratory and other test data, was performed as part of comprehensive evaluation and provision of chronic care management services.   SDOH:  (Social Determinants of Health) assessments and interventions performed:    CCM Care Plan  Allergies  Allergen Reactions  . Ibuprofen Other (See Comments)    Pt has hx of peptic ulcer and diverticulitis.     Medications Reviewed Today    Reviewed by Cherre Robins, PharmD (Pharmacist) on 09/02/20 at [place] List Status: <None>  Medication Order Taking? Sig Documenting Provider Last Dose Status Informant  albuterol (PROVENTIL) (2.5 MG/3ML) 0.083% nebulizer solution [id number] Yes USE 1 VIAL IN NEBULIZER EVERY 6 HOURS AS NEEDED FOR WHEEZING AND FOR SHORTNESS OF [name], Lenna Sciara, NP Taking Active   allopurinol (ZYLOPRIM) 100 MG tablet [id number] Yes Take 2 tablets (200 mg total) by mouth daily. Debbrah Alar, NP Taking Active   aspirin 81 MG EC tablet [id number] No Take 1 tablet (81 mg total) by mouth daily. Swallow whole.  Patient not taking: Reported on 09/02/2020   Debbrah Alar, NP Not Taking Active   atorvastatin (LIPITOR) 80 MG tablet [id number] Yes Take 1 tablet (80 mg total) by mouth daily. Debbrah Alar, NP Taking Active   colchicine 0.6 MG tablet [id number] No Take 0.6 mg by mouth daily as needed (gout).  Patient not taking: Reported on 09/02/2020   [provider] Not Taking Active Self  diazepam (VALIUM) 10 MG tablet [id number] Yes TAKE 1 TABLET BY MOUTH EVERY 12 HOURS AS NEEDED FOR ANXIETY  Patient taking differently: Take 10 mg by mouth every 12 (twelve) hours as needed for anxiety.   Debbrah Alar, NP Taking Active    dicyclomine (BENTYL) 10 MG capsule [id number] No Take 1 capsule (10 mg total) by mouth in the morning, at noon, in the evening, and at bedtime.  Patient not taking: Reported on 09/02/2020   Debbrah Alar, NP Not Taking Active   escitalopram (LEXAPRO) 20 MG tablet [id number] Yes Take 1 tablet (20 mg total) by mouth daily. Debbrah Alar, NP Taking Active   famotidine (PEPCID) 20 MG tablet [id number] No Take 1 tablet (20 mg total) by mouth 2 (two) times daily.  Patient not taking: Reported  on 09/02/2020   Lavena Bullion, DO Not Taking Active   levothyroxine (EUTHYROX) 25 MCG tablet [id number] Yes TAKE 1 TABLET BY MOUTH ONCE DAILY BEFORE Adaline Sill, NP Taking Active   lisinopril (ZESTRIL) 2.5 MG tablet [id number] Yes Take 1 tablet (2.5 mg total) by mouth daily. Debbrah Alar, NP Taking Active   meclizine (ANTIVERT) 25 MG tablet [id number] No Take 1 tablet (25 mg total) by mouth 2 (two) times daily as needed for dizziness.  Patient not taking: Reported on 09/02/2020   Debbrah Alar, NP Not Taking Active Self  metoprolol succinate (TOPROL-XL) 25 MG 24 hr tablet [id number] Yes Take 0.5 tablets (12.5 mg total) by mouth daily. Debbrah Alar, NP Taking Active   nitroGLYCERIN (NITROSTAT) 0.4 MG SL tablet [id number] Yes Place 0.4 mg under the tongue every 5 (five) minutes as needed for chest pain. [provider] Taking Active Self  nystatin (NYSTATIN) powder [id number] No APPLY  POWDER TOPICALLY TO AFFECTED AREA 2 TIMES DAILY  Patient not taking: Reported on 09/02/2020   Debbrah Alar, NP Not Taking Active   pantoprazole (PROTONIX) 40 MG tablet [id number] Yes Take 1 tablet by mouth twice daily  Patient taking differently: Take 40 mg by mouth 2 (two) times daily.   Debbrah Alar, NP Taking Active   potassium chloride SA (KLOR-CON) 20 MEQ tablet [id number] No Take 1 tablet (20 mEq total) by mouth daily.  Patient not taking: Reported on 09/02/2020    Debbrah Alar, NP Not Taking Active   sucralfate (CARAFATE) 1 g tablet [id number] Yes TAKE ONE TABLET BY MOUTH FOUR TIMES DAILY. MAY cut pill in half AND DISSOLVE in water prior TO drinking  Patient taking differently: Take 1 g by mouth every 4 (four) hours.   Lavena Bullion, DO Taking Active           Patient Active Problem List   Diagnosis Date Noted  . Dysphagia   . Esophageal stricture   . Hiatal hernia   . Abdominal pain, epigastric   . Hypothyroid 01/25/2018  . Mild CAD 10/03/2017  . Stress-induced cardiomyopathy 09/19/2017  . Hypokalemia 09/14/2017  . Non-ST elevation (NSTEMI) myocardial infarction [facility]) - due to Demand Infarction 09/14/2017  . CKD (chronic kidney disease), stage III ([place]) 04/04/2017  . HLD (hyperlipidemia)   . Allergic rhinitis   . Gout 02/28/2017  . Depression with anxiety 02/28/2017  . Epigastric abdominal pain 12/22/2016  . COPD with chronic bronchitis ([place]) 12/22/2016  . GERD (gastroesophageal reflux disease) 12/22/2016  . DOE (dyspnea on exertion) 12/22/2016    Immunization History  Administered Date(s) Administered  . Fluad Quad(high Dose 65+) 03/14/2020  . Influenza, High Dose Seasonal PF 01/23/2018, 12/07/2018  . Influenza-Unspecified 12/23/2018  . PFIZER(Purple Top)SARS-COV-2 Vaccination 05/14/2019, 06/08/2019  . Pneumococcal Conjugate-13 07/16/2015  . Pneumococcal Polysaccharide-23 12/23/2016  . Tdap 09/02/2009, 04/05/2017    Conditions to be addressed/monitored: CAD, HTN, HLD, COPD, Anxiety, Depression and gout, GERD ; hypothyroidism  Care Plan : General Pharmacy (Adult)  Updates made by Cherre Robins, PHARMD since 09/02/2020 12:00 AM    Problem: Medication management and Monitoring and Coordination of Care for Chronic Conditions   Priority: High  Onset Date: 09/02/2020    Long-Range Goal: Patient Stated   Start Date: 09/02/2020  This Visit's Progress: On track  Priority: High  Note:     Current Barriers:  . Unable  to achieve control of hyperlipidemia  . Does not adhere to prescribed medication regimen . Does not maintain contact with provider  office  Pharmacist Clinical Goal(s):  Marland Kitchen Over the next 90 days, patient will achieve adherence to monitoring guidelines and medication adherence to achieve therapeutic efficacy . achieve control of hyperlipidemia as evidenced by LDL < 70 . adhere to prescribed medication regimen as evidenced by filling prescriptions on times . contact provider office for questions/concerns as evidenced notation of same in electronic health record through collaboration with PharmD and provider.   Interventions: . 1:1 collaboration with Debbrah Alar, NP regarding development and update of comprehensive plan of care as evidenced by provider attestation and co-signature . Inter-disciplinary care team collaboration (see longitudinal plan of care) . Comprehensive medication review performed; medication list updated in electronic medical record  Hypertension: . Controlled; BP goal is:  <130/80 . Last Office BP was low at 95/52 - continue to monitor for low BP  . Patient is not checking BP at home - states she does not have BP cuff.  . Patient has failed these meds in the past: None noted  . Current Regimen: o Metoprolol Succinate 53m 0.5 tab daily o Lisinopril 2.567mdaily . Interventions:  o Reviewed signs an dsymptoms of low BP to monitor for.  o Discussed BP goal o Recommended get BP cuff and check BP 2 to 3 times per week.  o Continue current medications    Hyperlipidemia / history of MI: . Not controlled; LDL goal < 70  . Patient has failed these meds in past: None noted  . Current therapy o Atorvastatin 8019maily (increased 08/18/2020) o Metoprolol Succinate 58m20m2 tab daily o Aspirin 81mg41mly (has not started yet) . Interventions:  o Discussed LDL goal and recent LDL result o Discussed importance of taking atorvastatin daily to both lower cholesterol and  prevent ASCVD / recurrent MI  Chronic Obstructive Pulmonary Disease: . Controlled . Current treatment: o Albuterol solution for nebulizer - use in nebulizer up to every 6 hours as needed for shortness of breath . No exacerbations requiring treatment in the last 6 months  . Interventions: None  Depression/Anxiety:  . Controlled . Patient has failed these meds in past: citalopram (inefficacy?) . Current treatment: o Escitalopram 20mg 52my o Diazepam 10mg e51m 12 hours as needed  . Interventions:  o Coordinated refills for diazepam with pharmacy and provider. o Continue current therapy  Hypothyroidism:  . TSH was WNL at last check on 08/18/2020 . Patient has failed these meds in past: None noted  . Patient is currently controlled on the following medications:  o Euthyrox 58mcg d73m  . Intervention:  o Continue current therapy   Gout:   . Goal Uric Acid < 6 mg/dL  .  Last uric acid was not at goal (UA = 8.4 on 03/14/2020) but patient denies symptoms of gout at this time.  . Current medications:  o Allopurinol 100mg - t64m2 tablets = 200mg dail18mColchicine 0.6mg up to 45m as needed for acute gout . Medications that may increase uric acid levels: Aspirin . Last gout flare: Feb 2022 . Patient has failed these meds in past: None noted  .  Interventions:  o Reviewed preventative versus treatment medications for gout.  o Continue current therapy o Consider checking Uric acid with next labs.    Medication management:   . [facility] .  Coordinated refills for diazepam, Nystatin ([facility] to [facility])  .[place] that swallowing of pills is better sine EGD and esophageal dilation  except she has to split atorvastatin 43m tablet.  o Recommended she get pill splitter to make cutting easier. Asked [facility] to order and send with next delivery order.  o If any issues in future could switch atorvastatin to  rosuvastatin 480mwhich is a smaller tablet.     Patient Goals/Self-Care Activities . Over the next 90 days, patient will:  take medications as prescribed, focus on medication adherence by filing prescriptions at one pharmacy, and call if any issues with medications - especially with splitting tablets or swallowing tablets.   Follow Up Plan: The care management team will reach out to the patient again over the next 90 days.                       Medication Assistance: None required.  Patient affirms current coverage meets needs.  Patient's [facility] pharmacy is:  [place]: [phone]: [phone] [place] [address] [place]: [phone]: [phone] Up:  Patient agrees to Care Plan and Follow-up.  Plan: The care management team will reach out to the patient again over the next 90 days.  TaCherre RobinsPharmD Clinical Pharmacist [facility] [phone]

## 2020-09-02 NOTE — Telephone Encounter (Signed)
Last UDS and contract  December 2021

## 2020-09-02 NOTE — Telephone Encounter (Signed)
Opened in error duplicate request.

## 2020-09-02 NOTE — Telephone Encounter (Signed)
Patient requesting refill for diazepam 10mg  every 12 hours as needed. Last filled #45 on 07/28/2020 at [facility].  She is requesting Rx be sent to [facility] since [facility] can deliver.

## 2020-09-02 NOTE — Progress Notes (Signed)
See CCM note 

## 2020-09-05 ENCOUNTER — Ambulatory Visit: Payer: Medicare HMO | Admitting: Family

## 2020-09-05 NOTE — Progress Notes (Incomplete)
Subjective:   By signing my name below, I, [name], attest that this documentation has been prepared under the direction and in the presence of [name] NP. 09/05/2020      Patient ID: Veronica Wolf, female    DOB: [birth year], 80 y.o.   MRN: [id number]  No chief complaint on file.   HPI Patient is in today for a office visit. She reports passing black stools recently.   Hyperlipidemia- Hypertension- GERD-  Past Medical History:  Diagnosis Date  . Allergic rhinitis   . Allergy    seasonal allergies  . Anxiety   . CKD (chronic kidney disease) 04/04/2017  . COPD (chronic obstructive pulmonary disease) (HCC)    uses inhaler  . Depression   . GERD (gastroesophageal reflux disease)   . Gout    hx of  . Hyperlipidemia    on meds  . Hypertension    on meds  . Myocardial infarction (HCC)   . Peptic ulcer 01/04/2003  . Vertigo     Past Surgical History:  Procedure Laterality Date  . ABDOMINAL HYSTERECTOMY    . APPENDECTOMY    . BIOPSY  08/13/2020   Procedure: BIOPSY;  Surgeon: Shellia Cleverly, DO;  Location: [facility];  Service: Gastroenterology;;  . ESOPHAGOGASTRODUODENOSCOPY (EGD) WITH PROPOFOL N/A 08/13/2020   Procedure: ESOPHAGOGASTRODUODENOSCOPY (EGD) WITH PROPOFOL;  Surgeon: Shellia Cleverly, DO;  Location: [facility];  Service: Gastroenterology;  Laterality: N/A;  . LEFT HEART CATH AND CORONARY ANGIOGRAPHY N/A 06/13/2017   Procedure: LEFT HEART CATH AND CORONARY ANGIOGRAPHY;  Surgeon: Marykay Lex, MD;  Location: [facility] INVASIVE CV LAB;  Service: Cardiovascular;  Laterality: N/A;  . RIGHT/LEFT HEART CATH AND CORONARY ANGIOGRAPHY N/A 09/16/2017   Procedure: RIGHT/LEFT HEART CATH AND CORONARY ANGIOGRAPHY;  Surgeon: Swaziland, Peter M, MD;  Location: [facility] INVASIVE CV LAB;  Service: Cardiovascular;  Laterality: N/A;  . SAVORY DILATION N/A 08/13/2020   Procedure: SAVORY DILATION;  Surgeon: Shellia Cleverly, DO;  Location: [facility];  Service:  Gastroenterology;  Laterality: N/A;  . ULTRASOUND GUIDANCE FOR VASCULAR ACCESS  09/16/2017   Procedure: Ultrasound Guidance For Vascular Access;  Surgeon: Swaziland, Peter M, MD;  Location: [facility] INVASIVE CV LAB;  Service: Cardiovascular;;  . WISDOM TOOTH EXTRACTION      Family History  Problem Relation Age of Onset  . Breast cancer Mother   . Stroke Father   . Stomach cancer Neg Hx   . Colon cancer Neg Hx   . Pancreatic cancer Neg Hx   . Esophageal cancer Neg Hx   . Colon polyps Neg Hx   . Rectal cancer Neg Hx     Social History   Socioeconomic History  . Marital status: Widowed    Spouse name: Not on file  . Number of children: 1  . Years of education: Not on file  . Highest education level: Not on file  Occupational History  . Occupation: Retired  Tobacco Use  . Smoking status: Former Smoker    Quit date: 04/24/2000    Years since quitting: 20.3  . Smokeless tobacco: Never Used  Vaping Use  . Vaping Use: Never used  Substance and Sexual Activity  . Alcohol use: Not Currently    Comment: has previous hx of ETOH abuse, quit 2014  . Drug use: No  . Sexual activity: Never  Other Topics Concern  . 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 Worried About Programme researcher, broadcasting/film/video in the Last Year: Never true  . Ran Out of Food in the Last Year: Never true  Transportation Needs: Unmet Transportation Needs  . Lack of Transportation (Medical): No  . Lack of Transportation (Non-Medical): Yes  Physical Activity: Not on file  Stress: Not on file  Social Connections: Not on file  Intimate Partner Violence: Not on file    Outpatient Medications Prior to Visit  Medication Sig Dispense Refill  . albuterol (PROVENTIL) (2.5 MG/3ML) 0.083% nebulizer solution USE 1 VIAL IN NEBULIZER  EVERY 6 HOURS AS NEEDED FOR WHEEZING AND FOR SHORTNESS OF BREATH 150 mL 0  . allopurinol (ZYLOPRIM) 100 MG tablet Take 2 tablets (200 mg total) by mouth daily. 180 tablet 1  . aspirin 81 MG EC tablet Take 1 tablet (81 mg total) by mouth daily. Swallow whole. (Patient not taking: Reported on 09/02/2020) 30 tablet 12  . atorvastatin (LIPITOR) 80 MG tablet Take 1 tablet (80 mg total) by mouth daily. 90 tablet 1  . colchicine 0.6 MG tablet Take 0.6 mg by mouth daily as needed (gout). (Patient not taking: Reported on 09/02/2020)    . diazepam (VALIUM) 10 MG tablet Take 1 tablet (10 mg total) by mouth every 12 (twelve) hours as needed. for anxiety 45 tablet 0  . dicyclomine (BENTYL) 10 MG capsule Take 1 capsule (10 mg total) by mouth in the morning, at noon, in the evening, and at bedtime. (Patient not taking: Reported on 09/02/2020) 360 capsule 1  . escitalopram (LEXAPRO) 20 MG tablet Take 1 tablet (20 mg total) by mouth daily. 90 tablet 1  . famotidine (PEPCID) 20 MG tablet Take 1 tablet (20 mg total) by mouth 2 (two) times daily. (Patient not taking: Reported on 09/02/2020) 60 tablet 3  . levothyroxine (EUTHYROX) 25 MCG tablet TAKE 1 TABLET BY MOUTH ONCE DAILY BEFORE BREAKFAST 90 tablet 1  . lisinopril (ZESTRIL) 2.5 MG tablet Take 1 tablet (2.5 mg total) by mouth daily. 90 tablet 1  . meclizine (ANTIVERT) 25 MG tablet Take 1 tablet (25 mg total) by mouth 2 (two) times daily as needed for dizziness. (Patient not taking: Reported on 09/02/2020) 30 tablet 1  . metoprolol succinate (TOPROL-XL) 25 MG 24 hr tablet Take 0.5 tablets (12.5 mg total) by mouth daily. 45 tablet 0  . nitroGLYCERIN (NITROSTAT) 0.4 MG SL tablet Place 0.4 mg under the tongue every 5 (five) minutes as needed for chest pain.    Marland Kitchen nystatin (NYSTATIN) powder APPLY  POWDER TOPICALLY TO AFFECTED AREA 2 TIMES DAILY (Patient not taking: Reported on 09/02/2020) 60 g 1  . pantoprazole (PROTONIX) 40 MG tablet Take 1 tablet by mouth twice daily (Patient  taking differently: Take 40 mg by mouth 2 (two) times daily.) 180 tablet 0  . potassium chloride SA (KLOR-CON) 20 MEQ tablet Take 1 tablet (20 mEq total) by mouth daily. (Patient not taking: Reported on 09/02/2020) 90 tablet 1  . sucralfate (CARAFATE) 1 g tablet TAKE ONE TABLET BY MOUTH FOUR TIMES DAILY. MAY cut pill in half AND DISSOLVE in water prior TO drinking (Patient taking differently: Take 1 g by mouth every 4 (four) hours.) 120 tablet 0   No facility-administered medications prior to visit.    Allergies  Allergen Reactions  . Ibuprofen Other (See Comments)  Pt has hx of peptic ulcer and diverticulitis.     ROS     Objective:    Physical Exam Constitutional:      General: She is not in acute distress.    Appearance: Normal appearance. She is not ill-appearing.  HENT:     Head: Normocephalic and atraumatic.     Right Ear: External ear normal.     Left Ear: External ear normal.  Eyes:     Extraocular Movements: Extraocular movements intact.     Pupils: Pupils are equal, round, and reactive to light.  Cardiovascular:     Rate and Rhythm: Normal rate and regular rhythm.     Pulses: Normal pulses.     Heart sounds: Normal heart sounds. No murmur heard. No gallop.   Pulmonary:     Effort: Pulmonary effort is normal. No respiratory distress.     Breath sounds: Normal breath sounds. No wheezing, rhonchi or rales.  Skin:    General: Skin is warm and dry.  Neurological:     Mental Status: She is alert and oriented to person, place, and time.  Psychiatric:        Behavior: Behavior normal.     There were no vitals taken for this visit. Wt Readings from Last 3 Encounters:  08/18/20 202 lb (91.6 kg)  08/11/20 200 lb (90.7 kg)  06/11/20 198 lb 6.6 oz (90 kg)    Diabetic Foot Exam - Simple   No data filed    Lab Results  Component Value Date   WBC 9.1 06/11/2020   HGB 11.5 (L) 06/11/2020   HCT 38.1 06/11/2020   PLT 219 06/11/2020   GLUCOSE 66 (L) 08/18/2020    CHOL 204 (H) 08/18/2020   TRIG 182.0 (H) 08/18/2020   HDL 42.30 08/18/2020   LDLCALC 125 (H) 08/18/2020   ALT 7 08/18/2020   AST 12 08/18/2020   NA 141 08/18/2020   K 3.9 08/18/2020   CL 106 08/18/2020   CREATININE 1.38 (H) 08/18/2020   BUN 15 08/18/2020   CO2 27 08/18/2020   TSH 3.57 08/18/2020   INR 1.11 09/16/2017   HGBA1C 6.1 06/12/2018    Lab Results  Component Value Date   TSH 3.57 08/18/2020   Lab Results  Component Value Date   WBC 9.1 06/11/2020   HGB 11.5 (L) 06/11/2020   HCT 38.1 06/11/2020   MCV 91.6 06/11/2020   PLT 219 06/11/2020   Lab Results  Component Value Date   NA 141 08/18/2020   K 3.9 08/18/2020   CO2 27 08/18/2020   GLUCOSE 66 (L) 08/18/2020   BUN 15 08/18/2020   CREATININE 1.38 (H) 08/18/2020   BILITOT 0.2 08/18/2020   ALKPHOS 80 08/18/2020   AST 12 08/18/2020   ALT 7 08/18/2020   PROT 6.6 08/18/2020   ALBUMIN 3.8 08/18/2020   CALCIUM 8.6 08/18/2020   ANIONGAP 6 06/11/2020   GFR 35.98 (L) 08/18/2020   Lab Results  Component Value Date   CHOL 204 (H) 08/18/2020   Lab Results  Component Value Date   HDL 42.30 08/18/2020   Lab Results  Component Value Date   LDLCALC 125 (H) 08/18/2020   Lab Results  Component Value Date   TRIG 182.0 (H) 08/18/2020   Lab Results  Component Value Date   CHOLHDL 5 08/18/2020   Lab Results  Component Value Date   HGBA1C 6.1 06/12/2018       Assessment & Plan:   Problem List Items Addressed  This Visit   None      No orders of the defined types were placed in this encounter.   I, Sandford CrazeMelissa O'Sullivan NP, personally preformed the services described in this documentation.  All medical record entries made by the scribe were at my direction and in my presence.  I have reviewed the chart and discharge instructions (if applicable) and agree that the record reflects my personal performance and is accurate and complete. 09/05/2020   I,[name],acting as a Neurosurgeonscribe for Lemont FillersMelissa S O'Sullivan,  NP.,have documented all relevant documentation on the behalf of Lemont FillersMelissa S O'Sullivan, NP,as directed by  Lemont FillersMelissa S O'Sullivan, NP while in the presence of Lemont FillersMelissa S O'Sullivan, NP.   Shehryar [employer] BlockBaig

## 2020-09-08 ENCOUNTER — Encounter: Payer: Self-pay | Admitting: Family

## 2020-09-08 ENCOUNTER — Other Ambulatory Visit: Payer: Self-pay

## 2020-09-08 ENCOUNTER — Ambulatory Visit (INDEPENDENT_AMBULATORY_CARE_PROVIDER_SITE_OTHER): Payer: Medicare HMO | Admitting: Family

## 2020-09-08 VITALS — BP 100/63 | HR 65 | Temp 99.0°F | Resp 16 | Wt 202.6 lb

## 2020-09-08 DIAGNOSIS — R197 Diarrhea, unspecified: Secondary | ICD-10-CM | POA: Diagnosis not present

## 2020-09-08 DIAGNOSIS — R1013 Epigastric pain: Secondary | ICD-10-CM

## 2020-09-08 DIAGNOSIS — R195 Other fecal abnormalities: Secondary | ICD-10-CM | POA: Diagnosis not present

## 2020-09-08 LAB — CBC WITH DIFFERENTIAL/PLATELET
Basophils Absolute: 0.1 10*3/uL (ref 0.0–0.1)
Basophils Relative: 0.8 % (ref 0.0–3.0)
Eosinophils Absolute: 0.3 10*3/uL (ref 0.0–0.7)
Eosinophils Relative: 3.8 % (ref 0.0–5.0)
HCT: 36.1 % (ref 36.0–46.0)
Hemoglobin: 11.6 g/dL — ABNORMAL LOW (ref 12.0–15.0)
Lymphocytes Relative: 30.9 % (ref 12.0–46.0)
Lymphs Abs: 2.3 10*3/uL (ref 0.7–4.0)
MCHC: 32.1 g/dL (ref 30.0–36.0)
MCV: 85 fl (ref 78.0–100.0)
Monocytes Absolute: 0.3 10*3/uL (ref 0.1–1.0)
Monocytes Relative: 4.6 % (ref 3.0–12.0)
Neutro Abs: 4.4 10*3/uL (ref 1.4–7.7)
Neutrophils Relative %: 59.9 % (ref 43.0–77.0)
Platelets: 219 10*3/uL (ref 150.0–400.0)
RBC: 4.25 Mil/uL (ref 3.87–5.11)
RDW: 16.9 % — ABNORMAL HIGH (ref 11.5–15.5)
WBC: 7.3 10*3/uL (ref 4.0–10.5)

## 2020-09-08 NOTE — Progress Notes (Signed)
Subjective:   By signing my name below, I, [name], attest that this documentation has been prepared under the direction and in the presence of Sandford CrazeMelissa O'Sullivan NP. 09/08/2020    Patient ID: Veronica ChinaLois P Wolf, female    DOB: 1940/07/12, 80 y.o.   MRN: [id number]  Chief Complaint  Patient presents with  . Abdominal Pain    Complains of abdomina  pain   . Stool Color Change    Reports having dark stools.     HPI   Patient is in today for a office visit. She complains of stomach pain since January, 2022. She recently developed diarrhea, and vomiting, and abdominal pain in the center of her abdomen. She also notes that her stools have darkened color. She reports having on average 1-2 loose stools daily. She does not have a history of cholecystectomy. During her last endoscopy on 08/13/2020, she had a section of her esophagus dilated and found mild relief. She also complains of nausea after waking up in the morning. She does not take any medication to manage these symptoms.    Health Maintenance Due  Topic Date Due  . Pneumococcal Vaccine 400-81 Years old (1 of 4 - PCV13) Never done  . Zoster Vaccines- Shingrix (1 of 2) Never done  . COVID-19 Vaccine (3 - Booster for [employer] series) 11/08/2019    Past Medical History:  Diagnosis Date  . Allergic rhinitis   . Allergy    seasonal allergies  . Anxiety   . CKD (chronic kidney disease) 04/04/2017  . COPD (chronic obstructive pulmonary disease) (HCC)    uses inhaler  . Depression   . GERD (gastroesophageal reflux disease)   . Gout    hx of  . Hyperlipidemia    on meds  . Hypertension    on meds  . Myocardial infarction (HCC)   . Peptic ulcer 01/04/2003  . Vertigo     Past Surgical History:  Procedure Laterality Date  . ABDOMINAL HYSTERECTOMY    . APPENDECTOMY    . BIOPSY  08/13/2020   Procedure: BIOPSY;  Surgeon: Shellia Cleverlyirigliano, Vito V, DO;  Location: [facility];  Service: Gastroenterology;;  . ESOPHAGOGASTRODUODENOSCOPY  (EGD) WITH PROPOFOL N/A 08/13/2020   Procedure: ESOPHAGOGASTRODUODENOSCOPY (EGD) WITH PROPOFOL;  Surgeon: Shellia Cleverlyirigliano, Vito V, DO;  Location: [facility];  Service: Gastroenterology;  Laterality: N/A;  . LEFT HEART CATH AND CORONARY ANGIOGRAPHY N/A 06/13/2017   Procedure: LEFT HEART CATH AND CORONARY ANGIOGRAPHY;  Surgeon: Marykay LexHarding, David W, MD;  [facility];  Service: Cardiovascular;  Laterality: N/A;  . RIGHT/LEFT HEART CATH AND CORONARY ANGIOGRAPHY N/A 09/16/2017   Procedure: RIGHT/LEFT HEART CATH AND CORONARY ANGIOGRAPHY;  Surgeon: SwazilandJordan, Peter M, MD;  [facility];  Service: Cardiovascular;  Laterality: N/A;  . SAVORY DILATION N/A 08/13/2020   Procedure: SAVORY DILATION;  Surgeon: Shellia Cleverlyirigliano, Vito V, DO;  Location: [facility];  Service: Gastroenterology;  Laterality: N/A;  . ULTRASOUND GUIDANCE FOR VASCULAR ACCESS  09/16/2017   Procedure: Ultrasound Guidance For Vascular Access;  Surgeon: SwazilandJordan, Peter M, MD;  [facility];  Service: Cardiovascular;;  . WISDOM TOOTH EXTRACTION      Family History  Problem Relation Age of Onset  . Breast cancer Mother   . Stroke Father   . Stomach cancer Neg Hx   . Colon cancer Neg Hx   . Pancreatic cancer Neg Hx   . Esophageal cancer Neg Hx   . Colon polyps Neg Hx   .  Rectal cancer Neg Hx     Social History   Socioeconomic History  . Marital status: Widowed    Spouse name: Not on file  . Number of children: 1  . Years of education: Not on file  . Highest education level: Not on file  Occupational History  . Occupation: Retired  Tobacco Use  . Smoking status: Former Smoker    Quit date: 04/24/2000    Years since quitting: 20.3  . Smokeless tobacco: Never Used  Vaping Use  . Vaping Use: Never used  Substance and Sexual Activity  . Alcohol use: Not Currently    Comment: has previous hx of ETOH abuse, quit 2014  . Drug use: No  . Sexual activity: Never  Other Topics Concern  . 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 Worried About Programme researcher, broadcasting/film/video in the Last Year: Never true  . Ran Out of Food in the Last Year: Never true  Transportation Needs: Unmet Transportation Needs  . Lack of Transportation (Medical): No  . Lack of Transportation (Non-Medical): Yes  Physical Activity: Not on file  Stress: Not on file  Social Connections: Not on file  Intimate Partner Violence: Not on file    Outpatient Medications Prior to Visit  Medication Sig Dispense Refill  . albuterol (PROVENTIL) (2.5 MG/3ML) 0.083% nebulizer solution USE 1 VIAL IN NEBULIZER EVERY 6 HOURS AS NEEDED FOR WHEEZING AND FOR SHORTNESS OF BREATH 150 mL 0  . allopurinol (ZYLOPRIM) 100 MG tablet Take 2 tablets (200 mg total) by mouth daily. 180 tablet 1  . aspirin 81 MG EC tablet Take 1 tablet (81 mg total) by mouth daily. Swallow whole. 30 tablet 12  . atorvastatin (LIPITOR) 80 MG tablet Take 1 tablet (80 mg total) by mouth daily. 90 tablet 1  . colchicine 0.6 MG tablet Take 0.6 mg by mouth daily as needed (gout).    . diazepam (VALIUM) 10 MG tablet Take 1 tablet (10 mg total) by mouth every 12 (twelve) hours as needed. for anxiety 45 tablet 0  . dicyclomine (BENTYL) 10 MG capsule Take 1 capsule (10 mg total) by mouth in the morning, at noon, in the evening, and at bedtime. 360 capsule 1  . escitalopram (LEXAPRO) 20 MG tablet Take 1 tablet (20 mg total) by mouth daily. 90 tablet 1  . famotidine (PEPCID) 20 MG tablet Take 1 tablet (20 mg total) by mouth 2 (two) times daily. 60 tablet 3  . levothyroxine (EUTHYROX) 25 MCG tablet TAKE 1 TABLET BY MOUTH ONCE DAILY BEFORE BREAKFAST 90 tablet 1  . lisinopril (ZESTRIL) 2.5 MG tablet Take 1 tablet (2.5 mg total) by mouth daily. 90 tablet 1  . meclizine (ANTIVERT) 25 MG  tablet Take 1 tablet (25 mg total) by mouth 2 (two) times daily as needed for dizziness. 30 tablet 1  . metoprolol succinate (TOPROL-XL) 25 MG 24 hr tablet Take 0.5 tablets (12.5 mg total) by mouth daily. 45 tablet 0  . nitroGLYCERIN (NITROSTAT) 0.4 MG SL tablet Place 0.4 mg under the tongue every 5 (five) minutes as needed for chest pain.    Marland Kitchen nystatin (NYSTATIN) powder APPLY  POWDER TOPICALLY TO AFFECTED AREA 2 TIMES DAILY (Patient taking differently: APPLY  POWDER TOPICALLY TO  AFFECTED AREA 2 TIMES DAILY) 60 g 1  . pantoprazole (PROTONIX) 40 MG tablet Take 1 tablet by mouth twice daily (Patient taking differently: Take 40 mg by mouth 2 (two) times daily.) 180 tablet 0  . potassium chloride SA (KLOR-CON) 20 MEQ tablet Take 1 tablet (20 mEq total) by mouth daily. 90 tablet 1  . sucralfate (CARAFATE) 1 g tablet TAKE ONE TABLET BY MOUTH FOUR TIMES DAILY. MAY cut pill in half AND DISSOLVE in water prior TO drinking (Patient taking differently: Take 1 g by mouth every 4 (four) hours.) 120 tablet 0   No facility-administered medications prior to visit.    Allergies  Allergen Reactions  . Ibuprofen Other (See Comments)    Pt has hx of peptic ulcer and diverticulitis.     Review of Systems  Gastrointestinal: Positive for abdominal pain, diarrhea, melena, nausea (During morning) and vomiting (Occasional).       Objective:    Physical Exam Constitutional:      General: She is not in acute distress.    Appearance: Normal appearance. She is not ill-appearing.  HENT:     Head: Normocephalic and atraumatic.     Right Ear: External ear normal.     Left Ear: External ear normal.  Eyes:     Extraocular Movements: Extraocular movements intact.     Pupils: Pupils are equal, round, and reactive to light.  Cardiovascular:     Rate and Rhythm: Normal rate and regular rhythm.     Pulses: Normal pulses.     Heart sounds: Normal heart sounds. No murmur heard. No gallop.   Pulmonary:     Effort:  Pulmonary effort is normal. No respiratory distress.     Breath sounds: Normal breath sounds. No wheezing, rhonchi or rales.  Abdominal:     Palpations: Abdomen is soft.     Tenderness: There is abdominal tenderness in the right upper quadrant and epigastric area. There is no guarding.  Genitourinary:    Rectum: Guaiac result negative.     Comments: Normal rectal exam. Skin:    General: Skin is warm and dry.  Neurological:     Mental Status: She is alert and oriented to person, place, and time.  Psychiatric:        Behavior: Behavior normal.     BP 100/63 (BP Location: Right Arm, Patient Position: Sitting, Cuff Size: Large)   Pulse 65   Temp 99 F (37.2 C) (Oral)   Resp 16   Wt 202 lb 9.6 oz (91.9 kg)   SpO2 99%   BMI 34.78 kg/m  Wt Readings from Last 3 Encounters:  09/08/20 202 lb 9.6 oz (91.9 kg)  08/18/20 202 lb (91.6 kg)  08/11/20 200 lb (90.7 kg)       Assessment & Plan:   Problem List Items Addressed This Visit      Unprioritized   RESOLVED: Epigastric pain   Relevant Orders   US Abdomen Complete   Hepatic function panel   Lipase   Epigastric abdominal pain    I am concerned about the possibility of GB involvement.  She did not complete abd [place] previously ordered. Will re-order.  Check LFT, lipase.       Dark stools    Hemoccult negative today on exam. Check CBC.       Relevant Orders   CBC with Differential/Platelet    Other Visit Diagnoses    Diarrhea, unspecified type    -  Primary   Relevant Orders  GI Profile, Stool, PCR       No orders of the defined types were placed in this encounter.   I, [name] NP, personally preformed the services described in this documentation.  All medical record entries made by the scribe were at my direction and in my presence.  I have reviewed the chart and discharge instructions (if applicable) and agree that the record reflects my personal performance and is accurate and  complete.09/08/2020   I,[name],acting as a scribe for Lemont Fillers, NP.,have documented all relevant documentation on the behalf of Lemont Fillers, NP,as directed by  Lemont Fillers, NP while in the presence of Lemont Fillers, NP.   Lemont Fillers, NP

## 2020-09-08 NOTE — Patient Instructions (Signed)
Please complete lab work prior to leaving. Schedule your abdominal ultrasound on the first floor in imaging.

## 2020-09-08 NOTE — Assessment & Plan Note (Signed)
I am concerned about the possibility of GB involvement.  She did not complete abd [place] previously ordered. Will re-order.  Check LFT, lipase.

## 2020-09-08 NOTE — Assessment & Plan Note (Signed)
Hemoccult negative today on exam. Check CBC.

## 2020-09-09 LAB — HEPATIC FUNCTION PANEL
ALT: 5 U/L (ref 0–35)
AST: 10 U/L (ref 0–37)
Albumin: 3.6 g/dL (ref 3.5–5.2)
Alkaline Phosphatase: 64 U/L (ref 39–117)
Bilirubin, Direct: 0.1 mg/dL (ref 0.0–0.3)
Total Bilirubin: 0.4 mg/dL (ref 0.2–1.2)
Total Protein: 6.3 g/dL (ref 6.0–8.3)

## 2020-09-09 LAB — LIPASE: Lipase: 25 U/L (ref 11.0–59.0)

## 2020-09-11 ENCOUNTER — Encounter: Payer: Self-pay | Admitting: Family

## 2020-09-11 ENCOUNTER — Ambulatory Visit (HOSPITAL_BASED_OUTPATIENT_CLINIC_OR_DEPARTMENT_OTHER): Payer: Medicare HMO

## 2020-09-11 LAB — GI PROFILE, STOOL, PCR

## 2020-09-11 NOTE — Progress Notes (Signed)
Mailed out to patient 

## 2020-09-15 ENCOUNTER — Other Ambulatory Visit: Payer: Self-pay

## 2020-09-15 ENCOUNTER — Ambulatory Visit (HOSPITAL_BASED_OUTPATIENT_CLINIC_OR_DEPARTMENT_OTHER)
Admission: RE | Admit: 2020-09-15 | Discharge: 2020-09-15 | Disposition: A | Discharge status: Home / Self Care | Payer: Medicare HMO | Source: Ambulatory Visit | Attending: Family | Admitting: Family

## 2020-09-15 DIAGNOSIS — R1013 Epigastric pain: Secondary | ICD-10-CM | POA: Insufficient documentation

## 2020-09-15 DIAGNOSIS — K7689 Other specified diseases of liver: Secondary | ICD-10-CM | POA: Diagnosis not present

## 2020-09-16 ENCOUNTER — Telehealth: Payer: Self-pay | Admitting: Family

## 2020-09-16 ENCOUNTER — Encounter: Payer: Self-pay | Admitting: Family

## 2020-09-16 DIAGNOSIS — I10 Essential (primary) hypertension: Secondary | ICD-10-CM | POA: Insufficient documentation

## 2020-09-16 DIAGNOSIS — T7840XA Allergy, unspecified, initial encounter: Secondary | ICD-10-CM | POA: Insufficient documentation

## 2020-09-16 DIAGNOSIS — F419 Anxiety disorder, unspecified: Secondary | ICD-10-CM | POA: Insufficient documentation

## 2020-09-16 DIAGNOSIS — R42 Dizziness and giddiness: Secondary | ICD-10-CM | POA: Insufficient documentation

## 2020-09-16 NOTE — Telephone Encounter (Signed)
Please advise pt that her ultrasound shows fatty liver but otherwise looks good. Treatment for fatty liver is low fat/low cholesterol diet, exercise and weight loss.    Also, I recommend that she contact GI for follow up.  She had an endoscopy in mid may and their note says that they wanted to repeat in 4-6 weeks.

## 2020-09-17 ENCOUNTER — Encounter: Payer: Self-pay | Admitting: Cardiology

## 2020-09-17 ENCOUNTER — Ambulatory Visit: Payer: Medicare HMO | Admitting: Cardiology

## 2020-09-17 ENCOUNTER — Other Ambulatory Visit: Payer: Self-pay

## 2020-09-17 VITALS — BP 100/60 | HR 87 | Ht 64.0 in | Wt 205.0 lb

## 2020-09-17 DIAGNOSIS — E782 Mixed hyperlipidemia: Secondary | ICD-10-CM | POA: Diagnosis not present

## 2020-09-17 DIAGNOSIS — R7303 Prediabetes: Secondary | ICD-10-CM | POA: Diagnosis not present

## 2020-09-17 DIAGNOSIS — I272 Pulmonary hypertension, unspecified: Secondary | ICD-10-CM

## 2020-09-17 DIAGNOSIS — E8881 Metabolic syndrome: Secondary | ICD-10-CM | POA: Diagnosis not present

## 2020-09-17 DIAGNOSIS — E669 Obesity, unspecified: Secondary | ICD-10-CM

## 2020-09-17 DIAGNOSIS — I2510 Atherosclerotic heart disease of native coronary artery without angina pectoris: Secondary | ICD-10-CM

## 2020-09-17 DIAGNOSIS — R0789 Other chest pain: Secondary | ICD-10-CM

## 2020-09-17 MED ORDER — EZETIMIBE 10 MG PO TABS: 10.0000 mg | ORAL_TABLET | Freq: Every day | ORAL | 3 refills | Status: DC

## 2020-09-17 MED ORDER — ISOSORBIDE MONONITRATE ER 30 MG PO TB24: 15.0000 mg | ORAL_TABLET | Freq: Every day | ORAL | 3 refills | Status: DC

## 2020-09-17 NOTE — Progress Notes (Signed)
Cardiology Office Note:    Date:  09/17/2020   ID:  Veronica Wolf, DOB Oct 15, 1940, MRN [id number]  PCP:  [name], NP  Cardiologist:  Garwin Brothers, MD  Electrophysiologist:  None   Referring MD: [name], NP   No chief complaint on file.   History of Present Illness:    Veronica Wolf is a 80 y.o. female with a hx of coronary artery disease and coronary angiography revealed mild coronary artery disease in 2019, Mixed hyperlipidemia, CKD, hypertension  presents to reestablish care. She was seen in 2019 by my partner Dr. Henrietta Hoover.   She reports that she was primary care doctor to see cardiology as she has been experiencing intermittent chest pain.  She reports that over the last several weeks she noticed that when she is doing some activities she feels some chest pain on exertion.  Nothing makes it better or worse.  But she is concerned.  She denies any shortness of breath.   Past Medical History:  Diagnosis Date   Allergic rhinitis    Allergy    seasonal allergies   Anxiety    CKD (chronic kidney disease) 04/04/2017   COPD (chronic obstructive pulmonary disease) (HCC)    uses inhaler   Depression    Fatty liver    GERD (gastroesophageal reflux disease)    Gout    hx of   Hyperlipidemia    on meds   Hypertension    on meds   Myocardial infarction [facility])    Peptic ulcer 01/04/2003   Vertigo     Past Surgical History:  Procedure Laterality Date   ABDOMINAL HYSTERECTOMY     APPENDECTOMY     BIOPSY  08/13/2020   Procedure: BIOPSY;  Surgeon: Shellia Cleverly, DO;  Location: [facility];  Service: Gastroenterology;;   ESOPHAGOGASTRODUODENOSCOPY (EGD) WITH PROPOFOL N/A 08/13/2020   Procedure: ESOPHAGOGASTRODUODENOSCOPY (EGD) WITH PROPOFOL;  Surgeon: Shellia Cleverly, DO;  Location: [facility];  Service: Gastroenterology;  Laterality: N/A;   LEFT HEART CATH AND CORONARY ANGIOGRAPHY N/A 06/13/2017   Procedure: LEFT HEART CATH AND CORONARY  ANGIOGRAPHY;  Surgeon: Marykay Lex, MD;  Location: [facility] INVASIVE CV LAB;  Service: Cardiovascular;  Laterality: N/A;   RIGHT/LEFT HEART CATH AND CORONARY ANGIOGRAPHY N/A 09/16/2017   Procedure: RIGHT/LEFT HEART CATH AND CORONARY ANGIOGRAPHY;  Surgeon: Swaziland, Peter M, MD;  Location: [facility] INVASIVE CV LAB;  Service: Cardiovascular;  Laterality: N/A;   SAVORY DILATION N/A 08/13/2020   Procedure: SAVORY DILATION;  Surgeon: Shellia Cleverly, DO;  Location: [facility];  Service: Gastroenterology;  Laterality: N/A;   ULTRASOUND GUIDANCE FOR VASCULAR ACCESS  09/16/2017   Procedure: Ultrasound Guidance For Vascular Access;  Surgeon: Swaziland, Peter M, MD;  Location[facility] IN[facility] CV LAB;  Service: Cardiovascular;;   WISDOM TOOTH EXTRACTION      Current Medications: Current Meds  Medication Sig   albuterol (PROVENTIL) (2.5 MG/3ML) 0.083% nebulizer solution USE 1 VIAL IN NEBULIZER EVERY 6 HOURS AS NEEDED FOR WHEEZING AND FOR SHORTNESS OF BREATH   allopurinol (ZYLOPRIM) 100 MG tablet Take 2 tablets (200 mg total) by mouth daily.   aspirin 81 MG EC tablet Take 1 tablet (81 mg total) by mouth daily. Swallow whole.   atorvastatin (LIPITOR) 80 MG tablet Take 1 tablet (80 mg total) by mouth daily.   colchicine 0.6 MG tablet Take 0.6 mg by mouth daily as needed (gout).   diazepam (VALIUM) 10 MG tablet Take 1 tablet (10 mg total) by mouth  every 12 (twelve) hours as needed. for anxiety   dicyclomine (BENTYL) 10 MG capsule Take 1 capsule (10 mg total) by mouth in the morning, at noon, in the evening, and at bedtime.   escitalopram (LEXAPRO) 20 MG tablet Take 1 tablet (20 mg total) by mouth daily.   ezetimibe (ZETIA) 10 MG tablet Take 1 tablet (10 mg total) by mouth daily.   famotidine (PEPCID) 20 MG tablet Take 1 tablet (20 mg total) by mouth 2 (two) times daily.   isosorbide mononitrate (IMDUR) 30 MG 24 hr tablet Take 0.5 tablets (15 mg total) by mouth daily.   levothyroxine (EUTHYROX) 25 MCG tablet TAKE 1 TABLET  BY MOUTH ONCE DAILY BEFORE BREAKFAST   lisinopril (ZESTRIL) 2.5 MG tablet Take 1 tablet (2.5 mg total) by mouth daily.   meclizine (ANTIVERT) 25 MG tablet Take 1 tablet (25 mg total) by mouth 2 (two) times daily as needed for dizziness.   metoprolol succinate (TOPROL-XL) 25 MG 24 hr tablet Take 0.5 tablets (12.5 mg total) by mouth daily.   nitroGLYCERIN (NITROSTAT) 0.4 MG SL tablet Place 0.4 mg under the tongue every 5 (five) minutes as needed for chest pain.   nystatin (NYSTATIN) powder APPLY  POWDER TOPICALLY TO AFFECTED AREA 2 TIMES DAILY (Patient taking differently: APPLY  POWDER TOPICALLY TO AFFECTED AREA 2 TIMES DAILY)   pantoprazole (PROTONIX) 40 MG tablet Take 1 tablet by mouth twice daily (Patient taking differently: Take 40 mg by mouth 2 (two) times daily.)   potassium chloride SA (KLOR-CON) 20 MEQ tablet Take 1 tablet (20 mEq total) by mouth daily.   sucralfate (CARAFATE) 1 g tablet TAKE ONE TABLET BY MOUTH FOUR TIMES DAILY. MAY cut pill in half AND DISSOLVE in water prior TO drinking (Patient taking differently: Take 1 g by mouth every 4 (four) hours.)     Allergies:   Ibuprofen   Social History   Socioeconomic History   Marital status: Widowed    Spouse name: Not on file   Number of children: 1   Years of education: Not on file   Highest education level: Not on file  Occupational History   Occupation: Retired  Tobacco Use   Smoking status: Former    Pack years: 0.00    Types: Cigarettes    Quit date: 04/24/2000    Years since quitting: 20.4   Smokeless tobacco: Never  Vaping Use   Vaping Use: Never used  Substance and Sexual Activity   Alcohol use: Not Currently    Comment: has previous hx of ETOH abuse, quit 2014   Drug use: No   Sexual activity: Never  Other Topics Concern   Not on file  Social History Narrative   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 [place]   Grew up [place]   Has daughter locally   Social Determinants of Corporate investment bankerHealth   Financial Resource  Strain: Not on file  Food Insecurity: No Food Insecurity   Worried About Programme researcher, broadcasting/film/videounning Out of Food in the Last Year: Never true   Baristaan Out of Food in the Last Year: Never true  Transportation Needs: Personal assistantUnmet Transportation Needs   Lack of Transportation (Medical): No   Lack of Transportation (Non-Medical): Yes  Physical Activity: Not on file  Stress: Not on file  Social Connections: Not on file     Family History: The patient's family history includes Breast cancer in her mother; Stroke in her father. There is no history of Stomach cancer, Colon cancer, Pancreatic cancer, Esophageal  cancer, Colon polyps, or Rectal cancer.  ROS:   Review of Systems  Constitution: Negative for decreased appetite, fever and weight gain.  HENT: Negative for congestion, ear discharge, hoarse voice and sore throat.   Eyes: Negative for discharge, redness, vision loss in right eye and visual halos.  Cardiovascular: Negative for chest pain, dyspnea on exertion, leg swelling, orthopnea and palpitations.  Respiratory: Negative for cough, hemoptysis, shortness of breath and snoring.   Endocrine: Negative for heat intolerance and polyphagia.  Hematologic/Lymphatic: Negative for bleeding problem. Does not bruise/bleed easily.  Skin: Negative for flushing, nail changes, rash and suspicious lesions.  Musculoskeletal: Negative for arthritis, joint pain, muscle cramps, myalgias, neck pain and stiffness.  Gastrointestinal: Negative for abdominal pain, bowel incontinence, diarrhea and excessive appetite.  Genitourinary: Negative for decreased libido, genital sores and incomplete emptying.  Neurological: Negative for brief paralysis, focal weakness, headaches and loss of balance.  Psychiatric/Behavioral: Negative for altered mental status, depression and suicidal ideas.  Allergic/Immunologic: Negative for HIV exposure and persistent infections.    EKGs/Labs/Other Studies Reviewed:    The following studies were reviewed  today:   EKG:  The ekg ordered today demonstrates sinus rhythm, heart rate 87 bpm with R wave progression suggesting old anterior infarction.  Right and left heart catheterization in June 2019   Prox RCA lesion is 20% stenosed. Post Atrio lesion is 20% stenosed. Hemodynamic findings consistent with moderate pulmonary hypertension. LV end diastolic pressure is moderately elevated.   1. No significant CAD 2. Moderate to severely elevated LV filling pressures 3. Moderate pulmonary venous hypertension. 4. Normal cardiac output.   Plan: recommend IV diuresis. I suspect elevated troponin is due to demand ischemia in setting of decompensated heart failure.   TTE 09/15/2017 Study Conclusions  Left ventricle: The cavity size was normal. Wall thickness was    normal. Systolic function was normal. The estimated ejection    fraction was in the range of 55% to 60%. Mild hypokinesis of the    mid-apicallateral and inferolateral myocardium. Doppler    parameters are consistent with abnormal left ventricular    relaxation (grade 1 diastolic dysfunction).  - Pulmonary arteries: Systolic pressure was moderately increased.    PA peak pressure: 62 mm Hg (S).    Recent Labs: 08/18/2020: BUN 15; Creatinine, Ser 1.38; Potassium 3.9; Sodium 141; TSH 3.57 09/08/2020: ALT 5; Hemoglobin 11.6; Platelets 219.0  Recent Lipid Panel    Component Value Date/Time   CHOL 204 (H) 08/18/2020 1203   TRIG 182.0 (H) 08/18/2020 1203   HDL 42.30 08/18/2020 1203   CHOLHDL 5 08/18/2020 1203   VLDL 36.4 08/18/2020 1203   LDLCALC 125 (H) 08/18/2020 1203   LDLCALC 108 (H) 03/14/2020 1439    Physical Exam:    VS:  BP 100/60 (BP Location: Right Arm)   Pulse 87   Ht  (1.626 m)   Wt 205 lb (93 kg)   SpO2 96%   BMI 35.19 kg/m     Wt Readings from Last 3 Encounters:  09/17/20 205 lb (93 kg)  09/08/20 202 lb 9.6 oz (91.9 kg)  08/18/20 202 lb (91.6 kg)     GEN: Well nourished, well developed in no acute  distress HEENT: Normal NECK: No JVD; No carotid bruits LYMPHATICS: No lymphadenopathy CARDIAC: S1S2 noted,RRR, no murmurs, rubs, gallops RESPIRATORY:  Clear to auscultation without rales, wheezing or rhonchi  ABDOMEN: Soft, non-tender, non-distended, +bowel sounds, no guarding. EXTREMITIES: No edema, No cyanosis, no clubbing MUSCULOSKELETAL:  No deformity  SKIN: Warm and dry NEUROLOGIC:  Alert and oriented x 3, non-focal PSYCHIATRIC:  Normal affect, good insight  ASSESSMENT:    1. Coronary artery disease involving native coronary artery of native heart, unspecified whether angina present   2. Other chest pain   3. Metabolic syndrome   4. Prediabetes   5. Mixed hyperlipidemia   6. Obesity (BMI 30-39.9)   7. Pulmonary hypertension, unspecified (HCC)    PLAN:    1. Her chest pain is concerning given her coronary artery disease I like to start the patient on low-dose Imdur 15 mg daily and also proceed with a functional ischemic evaluation.  Nuclear stress test will be appropriate at this time.  I talked to the patient about this test and she is agreeable to proceed with this.  2. In addition she is on atorvastatin 80 mg daily but her most recent lipid profile on Aug 18, 2020 showed HDL 42, LDL 125, total cholesterol 204, triglyceride 182.  Her LDL target is less than 70 therefore I am going to add Zetia 10 mg daily to her already taking atorvastatin 80 mg daily. 3.  The patient understands the need to lose weight with diet and exercise. We have discussed specific strategies for this.  4.  She is prediabetic most recent hemoglobin A1c.   The patient is in agreement with the above plan. The patient left the office in stable condition.  The patient will follow up in 12 weeks .   Medication Adjustments/Labs and Tests Ordered: Current medicines are reviewed at length with the patient today.  Concerns regarding medicines are outlined above.  Orders Placed This Encounter  Procedures    Hemoglobin A1c   MYOCARDIAL PERFUSION IMAGING   EKG 12-Lead   Meds ordered this encounter  Medications   ezetimibe (ZETIA) 10 MG tablet    Sig: Take 1 tablet (10 mg total) by mouth daily.    Dispense:  90 tablet    Refill:  3   isosorbide mononitrate (IMDUR) 30 MG 24 hr tablet    Sig: Take 0.5 tablets (15 mg total) by mouth daily.    Dispense:  45 tablet    Refill:  3    Patient Instructions  Medication Instructions:  Your physician has recommended you make the following change in your medication: START: Zetia 10 mg once daily START: Imdur 15 mg once daily *If you need a refill on your cardiac medications before your next appointment, please call your pharmacy*   Lab Work: None If you have labs (blood work) drawn today and your tests are completely normal, you will receive your results only by: MyChart Message (if you have MyChart) OR A paper copy in the mail If you have any lab test that is abnormal or we need to change your treatment, we will call you to review the results.   Testing/Procedures:   [facility] Cardiovascular Imaging at [facility] [address], [address] [place], [place] [address] Phone: [phone]    Please arrive 15 minutes prior to your appointment time for registration and insurance purposes.  The test will take approximately 3 to 4 hours to complete; you may bring reading material.  If someone comes with you to your appointment, they will need to remain in the main lobby due to limited space in the testing area. **If you are pregnant or breastfeeding, please notify the nuclear lab prior to your appointment**  How to prepare for your Myocardial Perfusion Test: Do not eat or drink  3 hours prior to your test, except you may have water. Do not consume products containing caffeine (regular or decaffeinated) 12 hours prior to your test. (ex: coffee, chocolate, sodas, tea). Do bring a list of your current medications with you.  If not listed  below, you may take your medications as normal. Do wear comfortable clothes (no dresses or overalls) and walking shoes, tennis shoes preferred (No heels or open toe shoes are allowed). Do NOT wear cologne, perfume, aftershave, or lotions (deodorant is allowed). If these instructions are not followed, your test will have to be rescheduled.  Please report to [address], [address] for your test.  If you have questions or concerns about your appointment, you can call the Nuclear Lab at [phone].  If you cannot keep your appointment, please provide 24 hours notification to the Nuclear Lab, to avoid a possible $50 charge to your account.    Follow-Up: At [facility], you and your health needs are our priority.  As part of our continuing mission to provide you with exceptional heart care, we have created designated Provider Care Teams.  These Care Teams include your primary Cardiologist (physician) and Advanced Practice Providers (APPs -  Physician Assistants and Nurse Practitioners) who all work together to provide you with the care you need, when you need it.  We recommend signing up for the patient portal called "MyChart".  Sign up information is provided on this After Visit Summary.  MyChart is used to connect with patients for Virtual Visits (Telemedicine).  Patients are able to view lab/test results, encounter notes, upcoming appointments, etc.  Non-urgent messages can be sent to your provider as well.   To learn more about what you can do with MyChart, go to [email or web address].    Your next appointment:   12 week(s)  The format for your next appointment:   In Person  Provider:   Thomasene Ripple, DO   Other Instructions    Adopting a Healthy Lifestyle.  Know what a healthy weight is for you (roughly BMI <25) and aim to maintain this   Aim for 7+ servings of fruits and vegetables daily   65-80+ fluid ounces of water or unsweet tea for healthy kidneys   Limit to  max 1 drink of alcohol per day; avoid smoking/tobacco   Limit animal fats in diet for cholesterol and heart health - choose grass fed whenever available   Avoid highly processed foods, and foods high in saturated/trans fats   Aim for low stress - take time to unwind and care for your mental health   Aim for 150 min of moderate intensity exercise weekly for heart health, and weights twice weekly for bone health   Aim for 7-9 hours of sleep daily   When it comes to diets, agreement about the perfect plan isnt easy to find, even among the experts. Experts at the [facility] of [employer] developed an idea known as the Healthy Eating Plate. Just imagine a plate divided into logical, healthy portions.   The emphasis is on diet quality:   Load up on vegetables and fruits - one-half of your plate: Aim for color and variety, and remember that potatoes dont count.   Go for whole grains - one-quarter of your plate: Whole wheat, barley, wheat berries, quinoa, oats, brown rice, and foods made with them. If you want pasta, go with whole wheat pasta.   Protein power - one-quarter of your plate: Fish, chicken, beans, and nuts are  all healthy, versatile protein sources. Limit red meat.   The diet, however, does go beyond the plate, offering a few other suggestions.   Use healthy plant oils, such as olive, canola, soy, corn, sunflower and peanut. Check the labels, and avoid partially hydrogenated oil, which have unhealthy trans fats.   If youre thirsty, drink water. Coffee and tea are good in moderation, but skip sugary drinks and limit milk and dairy products to one or two daily servings.   The type of carbohydrate in the diet is more important than the amount. Some sources of carbohydrates, such as vegetables, fruits, whole grains, and beans-are healthier than others.   Finally, stay active  Signed, Thomasene Ripple, DO  09/17/2020 10:50 AM    [facility]

## 2020-09-17 NOTE — Patient Instructions (Signed)
Medication Instructions:  Your physician has recommended you make the following change in your medication: START: Zetia 10 mg once daily START: Imdur 15 mg once daily *If you need a refill on your cardiac medications before your next appointment, please call your pharmacy*   Lab Work: None If you have labs (blood work) drawn today and your tests are completely normal, you will receive your results only by: MyChart Message (if you have MyChart) OR A paper copy in the mail If you have any lab test that is abnormal or we need to change your treatment, we will call you to review the results.   Testing/Procedures:   [facility] Cardiovascular Imaging at [facility] [address], [address] [place], [place] [address] Phone: [phone]    Please arrive 15 minutes prior to your appointment time for registration and insurance purposes.  The test will take approximately 3 to 4 hours to complete; you may bring reading material.  If someone comes with you to your appointment, they will need to remain in the main lobby due to limited space in the testing area. **If you are pregnant or breastfeeding, please notify the nuclear lab prior to your appointment**  How to prepare for your Myocardial Perfusion Test: Do not eat or drink 3 hours prior to your test, except you may have water. Do not consume products containing caffeine (regular or decaffeinated) 12 hours prior to your test. (ex: coffee, chocolate, sodas, tea). Do bring a list of your current medications with you.  If not listed below, you may take your medications as normal. Do wear comfortable clothes (no dresses or overalls) and walking shoes, tennis shoes preferred (No heels or open toe shoes are allowed). Do NOT wear cologne, perfume, aftershave, or lotions (deodorant is allowed). If these instructions are not followed, your test will have to be rescheduled.  Please report to [address], [address] for your test.   If you have questions or concerns about your appointment, you can call the Nuclear Lab at [phone].  If you cannot keep your appointment, please provide 24 hours notification to the Nuclear Lab, to avoid a possible $50 charge to your account.    Follow-Up: At [facility], you and your health needs are our priority.  As part of our continuing mission to provide you with exceptional heart care, we have created designated Provider Care Teams.  These Care Teams include your primary Cardiologist (physician) and Advanced Practice Providers (APPs -  Physician Assistants and Nurse Practitioners) who all work together to provide you with the care you need, when you need it.  We recommend signing up for the patient portal called "MyChart".  Sign up information is provided on this After Visit Summary.  MyChart is used to connect with patients for Virtual Visits (Telemedicine).  Patients are able to view lab/test results, encounter notes, upcoming appointments, etc.  Non-urgent messages can be sent to your provider as well.   To learn more about what you can do with MyChart, go to [email or web address].    Your next appointment:   12 week(s)  The format for your next appointment:   In Person  Provider:   Thomasene Ripple, DO   Other Instructions

## 2020-09-17 NOTE — Telephone Encounter (Signed)
Patient advised of result and to f/up with GI. She verbalized understanding

## 2020-09-18 ENCOUNTER — Telehealth: Payer: Self-pay | Admitting: Cardiology

## 2020-09-18 ENCOUNTER — Telehealth: Payer: Self-pay

## 2020-09-18 LAB — HEMOGLOBIN A1C
Est. average glucose Bld gHb Est-mCnc: 131 mg/dL
Hgb A1c MFr Bld: 6.2 % — ABNORMAL HIGH (ref 4.8–5.6)

## 2020-09-18 NOTE — Telephone Encounter (Signed)
Spoke with patient regarding results and recommendation.  Patient verbalizes understanding and is agreeable to plan of care. Advised patient to call back with any issues or concerns.  

## 2020-09-18 NOTE — Telephone Encounter (Signed)
Patient calling back with questions about the results given.

## 2020-09-18 NOTE — Telephone Encounter (Signed)
-----   Message from Thomasene Ripple, DO sent at 09/18/2020  7:57 AM EDT ----- Hemoglobin A1c, still prediabetic 6.2

## 2020-09-18 NOTE — Telephone Encounter (Signed)
Pt is calling back with questions about the results she has received

## 2020-09-18 NOTE — Telephone Encounter (Signed)
Spoke to patient went ober lab results with her again. Advised her to check with pcp regarding a1c. No further questions.

## 2020-09-23 ENCOUNTER — Other Ambulatory Visit: Payer: Self-pay | Admitting: Family

## 2020-09-23 DIAGNOSIS — R42 Dizziness and giddiness: Secondary | ICD-10-CM

## 2020-09-24 ENCOUNTER — Telehealth: Payer: Self-pay

## 2020-09-24 NOTE — Telephone Encounter (Signed)
Patient called today concerned stating she had received a call stating she had diabetes.  I looked back in patient's chart and asked patient could that information have been from a recent visit to Dr. Mallory Shirk office and she said yes.  I read back through messages and advised patient that it stated she was at a pre-diabetic range of 6.2 from some lab work that was done there last week and in the phone calls that had been placed between Dr. Mallory Shirk office and the patient they had stated for her to call them back with any further questions or concerns.  Patient was very worried and was a bit calmer once I stated it was in the pre-diabetic range "still" as noted in the chart by Cardiology.  Patient stated she would give Dr. Mallory Shirk office a call.  I advised patient to please call us if she had any further questions after speaking with Dr. Mallory Shirk office or if she felt like she needed to schedule an appointment with Melissa to further discuss this.

## 2020-09-24 NOTE — Telephone Encounter (Signed)
Spoke with patient about her A1C and diet recommendations. Patient verbalizes understanding. Link was sent to patient's daughter to set up MyChart. No further questions or concerns at this time.

## 2020-09-24 NOTE — Telephone Encounter (Signed)
    Pt said she called her pcp regarding her a1c result and was told to call Dr. Mallory Shirk office

## 2020-09-24 NOTE — Telephone Encounter (Signed)
Spoke with patient, see chart.    

## 2020-09-25 ENCOUNTER — Telehealth (HOSPITAL_COMMUNITY): Payer: Self-pay | Admitting: *Deleted

## 2020-09-25 NOTE — Telephone Encounter (Signed)
err

## 2020-09-25 NOTE — Telephone Encounter (Signed)
Patient given detailed instructions per Myocardial Perfusion Study Information Sheet for the test on 10/03/20 at 10:30. Patient notified to arrive 15 minutes early and that it is imperative to arrive on time for appointment to keep from having the test rescheduled.  If you need to cancel or reschedule your appointment, please call the office within 24 hours of your appointment. . Patient verbalized understanding.Daneil Dolin

## 2020-10-01 ENCOUNTER — Other Ambulatory Visit: Payer: Self-pay | Admitting: Family

## 2020-10-01 NOTE — Telephone Encounter (Signed)
Last RX:08-21-2020 Last OV:07-29-2020 Next OV: no future appointments UDS: 03-14-2020 CSC:03-14-2020

## 2020-10-03 ENCOUNTER — Encounter (HOSPITAL_COMMUNITY): Payer: Medicare HMO

## 2020-10-12 ENCOUNTER — Other Ambulatory Visit: Payer: Self-pay | Admitting: Family

## 2020-10-12 DIAGNOSIS — R42 Dizziness and giddiness: Secondary | ICD-10-CM

## 2020-10-20 ENCOUNTER — Other Ambulatory Visit: Payer: Self-pay | Admitting: *Deleted

## 2020-10-20 NOTE — Patient Outreach (Signed)
[facility]) Care Management  10/20/2020  DESIRAE MANCUSI 1940/06/07 [id number]  Unsuccessful outreach attempt made to patient. Patient answered the phone and stated that she would not be able to speak today. She did request that this nurse call back at a later date.   Plan: RN Health Coach will call patient within the month of August.  Blanchie Serve RN, BSN [facility] Care Management  RN Health Coach [phone] [email or web address]

## 2020-10-21 ENCOUNTER — Ambulatory Visit: Payer: Medicare HMO | Admitting: Family

## 2020-10-23 ENCOUNTER — Other Ambulatory Visit: Payer: Self-pay | Admitting: Family

## 2020-10-23 ENCOUNTER — Telehealth (HOSPITAL_COMMUNITY): Payer: Self-pay

## 2020-10-23 ENCOUNTER — Telehealth: Payer: Self-pay | Admitting: Cardiology

## 2020-10-23 NOTE — Telephone Encounter (Signed)
[facility] would lall the patient's active medications sent to them. They state [facility] refuses to transfer the medications.

## 2020-10-23 NOTE — Telephone Encounter (Signed)
Attempted to contact the patient. There was no answer, I will try again later. S.Mateusz Neilan EMTP 

## 2020-10-23 NOTE — Telephone Encounter (Signed)
Elease Hashimoto is returning Sabrina's call. Please advise.

## 2020-10-28 ENCOUNTER — Other Ambulatory Visit: Payer: Self-pay

## 2020-10-28 ENCOUNTER — Encounter (HOSPITAL_COMMUNITY): Payer: Self-pay | Admitting: Cardiology

## 2020-10-28 ENCOUNTER — Encounter (HOSPITAL_COMMUNITY): Payer: Medicare HMO

## 2020-10-28 ENCOUNTER — Telehealth: Payer: Self-pay | Admitting: *Deleted

## 2020-10-28 MED ORDER — ATORVASTATIN CALCIUM 80 MG PO TABS
80.0000 mg | ORAL_TABLET | Freq: Every day | ORAL | 3 refills | Status: DC
Start: 2020-10-28 — End: 2021-03-20

## 2020-10-28 MED ORDER — METOPROLOL SUCCINATE ER 25 MG PO TB24: 12.5000 mg | ORAL_TABLET | Freq: Every day | ORAL | 3 refills | Status: DC

## 2020-10-28 MED ORDER — ASPIRIN 81 MG PO TBEC
81.0000 mg | DELAYED_RELEASE_TABLET | Freq: Every day | ORAL | 12 refills | Status: AC
Start: 2020-10-28 — End: ?

## 2020-10-28 MED ORDER — EZETIMIBE 10 MG PO TABS: 10.0000 mg | ORAL_TABLET | Freq: Every day | ORAL | 3 refills | Status: DC

## 2020-10-28 MED ORDER — NITROGLYCERIN 0.4 MG SL SUBL: 0.4000 mg | SUBLINGUAL_TABLET | SUBLINGUAL | 3 refills | Status: DC | PRN

## 2020-10-28 MED ORDER — LISINOPRIL 2.5 MG PO TABS: 2.5000 mg | ORAL_TABLET | Freq: Every day | ORAL | 3 refills | Status: DC

## 2020-10-28 MED ORDER — ISOSORBIDE MONONITRATE ER 30 MG PO TB24: 15.0000 mg | ORAL_TABLET | Freq: Every day | ORAL | 3 refills | Status: DC

## 2020-10-28 NOTE — Telephone Encounter (Signed)
Refill sent to pharmacy.   

## 2020-10-28 NOTE — Telephone Encounter (Signed)
Who Is Calling Patient / Member / Family / Caregiver Call Type Triage / Clinical Relationship To Patient Self Return Phone Number [phone] (Primary) Chief Complaint Health information question (non symptomatic) Reason for Call Symptomatic / Request for Health Information Initial Comment Caller States she has a questions regarding diabetes. Wants to know if she can fly overseas if she has diabetes? Translation No Nurse Assessment Nurse: Warren Danes, RN, Dyke Maes Date/Time (Eastern Time): 10/27/2020 5:25:02 PM Confirm and document reason for call. If symptomatic, describe symptoms. ---Caller States she has a questions regarding diabetes. Wants to know if she can fly overseas if she has diabetes? New diagnosis of diagnosis

## 2020-10-28 NOTE — Telephone Encounter (Signed)
Lvm for patient to call back about her question  

## 2020-10-31 ENCOUNTER — Telehealth (HOSPITAL_COMMUNITY): Payer: Self-pay | Admitting: Cardiology

## 2020-10-31 NOTE — Telephone Encounter (Signed)
Patient is not on any medications.  Advised to watch diet, carbs and simple sugars as she was advised.

## 2020-10-31 NOTE — Telephone Encounter (Signed)
Patient left a voicemail on the Nuclear Department telephone on 10/30/20.  I returned patients call and asked if she would like to reschedule the No Showed Myoview.  She doesn't wish to reschedule at this time and she got my office number and will call back at a later date to reschedule when she is ready. Thank you.

## 2020-11-11 ENCOUNTER — Telehealth (HOSPITAL_COMMUNITY): Payer: Self-pay | Admitting: Cardiology

## 2020-11-11 NOTE — Telephone Encounter (Signed)
Please see below communication regarding patient s scheduled myoview:  Notes  10/31/20 I called pt back due to message left on nuc phone. Patient does not wish to reschedule at this time and will call back.LBW 8:51  10/28/20 NO SHOWED-MAILED LETTER LBW  CONSENT SIGNED  10/02/2020 3:50 PM QP:RFFM, ASHLEY N  Cancel Rsn: Patient (death in family. Will callback to reschedule in about 2-3 weeks when back.)   Order will be removed from the [facility] WQ. If patient calls back to reschedule we will reinstate the order or create a new one. Thank you.

## 2020-11-13 ENCOUNTER — Other Ambulatory Visit: Payer: Self-pay | Admitting: *Deleted

## 2020-11-13 NOTE — Patient Outreach (Signed)
[facility]) Care Management  11/13/2020  GAILE ALLMON 03-19-1941 [id number]  Outreach attempt to patient. No answer and unable to leave voicemail message due to patient's phone rang 12 times without any answer.  Plan: RN Health Coach will call patient within the month of September.  Blanchie Serve RN, BSN [facility] Care Management  RN Health Coach [phone] [email or web address]

## 2020-11-14 ENCOUNTER — Telehealth: Payer: Self-pay | Admitting: Family

## 2020-11-14 NOTE — Telephone Encounter (Signed)
Patient reports provider asked her during virtual visit if she had checked her blood pressure but she did not have a bp machine. Patient advised we always asked virtual  patients for vitals.  Patient advised the bp machine is always a good thing to have and it will be beneficial for her to get one.

## 2020-11-14 NOTE — Telephone Encounter (Signed)
Patient calling asking how she is to take her BP at home(MO has asked her to do so)  Please advise

## 2020-12-01 ENCOUNTER — Telehealth: Payer: Medicare HMO

## 2020-12-01 DIAGNOSIS — K760 Fatty (change of) liver, not elsewhere classified: Secondary | ICD-10-CM | POA: Insufficient documentation

## 2020-12-02 ENCOUNTER — Ambulatory Visit (INDEPENDENT_AMBULATORY_CARE_PROVIDER_SITE_OTHER): Payer: Medicare HMO | Admitting: Pharmacist

## 2020-12-02 DIAGNOSIS — I2510 Atherosclerotic heart disease of native coronary artery without angina pectoris: Secondary | ICD-10-CM

## 2020-12-02 DIAGNOSIS — E785 Hyperlipidemia, unspecified: Secondary | ICD-10-CM

## 2020-12-02 NOTE — Chronic Care Management (AMB) (Signed)
Chronic Care Management Pharmacy Note  12/02/2020 Name:  Veronica Wolf MRN:  [id number] DOB:  1940-11-19  Summary: Last LDL was 126. Refill history shows that atorvastatin has not been filled since 08/12/2020 - patient states she has not been taking atorvastatin every day. Also does not think that she has been taking ezetimibe.  Patient reports having back pain and possibly UTI  Recommendations/Changes made from today's visit: Will come in tomorrow for a face to face visit to review medications. I will assist in ordering medications needed Also made appt to see PCP tomorrow to evaluate back pain / UTI.   Subjective: Veronica Wolf is an 80 y.o. year old female who is a primary patient of Debbrah Alar, NP.  The CCM team was consulted for assistance with disease management and care coordination needs.    Engaged with patient by telephone for follow up visit in response to provider referral for pharmacy case management and/or care coordination services.   Consent to Services:  The patient was given information about Chronic Care Management services, agreed to services, and gave verbal consent prior to initiation of services.  Please see initial visit note for detailed documentation.   Patient Care Team: Debbrah Alar, NP as PCP - General (Internal Medicine) Revankar, Reita Cliche, MD as PCP - Cardiology (Cardiology) Michiel Cowboy, RN as [place] Management Cherre Robins, PharmD (Pharmacist)  Recent office visits: 09/16/2020 - PCP Phone Call. Ultrasound showed fatty liver. Recommended follow up with GI.  09/08/2020 - PCP Inda Castle) Seen for abdominal pain and diarrhea. Hemoccult negative. Reordered Abdominal CT, LFTs and lipase, CBC. No medication changes 08/18/2020 - PCP Inda Castle) - follow up chronic conditions. Atorvastatin increased to 80mg  daily after LDL checked and found to be 125; Also added aspirin 81mg  daily due to h/o MI.  Recent consult  visits: 09/17/2020 - Cardio (Dr Harriet Masson) CAD / intermittant chest pain. Started low dose Imdue 15mg  daily and ezetimibe 10mg  daily. Ordered nucular stress test  GI - see below / EGD done at [facility] visits: 08/13/2020 - EGD at [facility] - Dr Bryan Lemma 06/11/2020 - ED Visit for epigastric pain. Has esophageal stricture  Plan to get CBC, CMP, troponin x2, CT abdomen pelvis.  Will also      contact her GI doctor regarding recommendations. CT abdomen pelvis unremarkable.  Troponin negative x2. Discussed with Dr. Bryan Lemma (her GI doctor).  He states that if the work-up is negative, he plans to follow-up with her next week for a esophageal dilation and      look for stomach ulcer. Prescribed metoclopramide 10mg  q6 hours as needed   Objective:  Lab Results  Component Value Date   CREATININE 1.38 (H) 08/18/2020   CREATININE 1.23 (H) 06/11/2020   CREATININE 1.45 (H) 03/14/2020    Lab Results  Component Value Date   HGBA1C 6.2 (H) 09/17/2020   Last diabetic Eye exam: No results found for: HMDIABEYEEXA  Last diabetic Foot exam: No results found for: HMDIABFOOTEX      Component Value Date/Time   CHOL 204 (H) 08/18/2020 1203   TRIG 182.0 (H) 08/18/2020 1203   HDL 42.30 08/18/2020 1203   CHOLHDL 5 08/18/2020 1203   VLDL 36.4 08/18/2020 1203   LDLCALC 125 (H) 08/18/2020 1203   LDLCALC 108 (H) 03/14/2020 1439    Hepatic Function Latest Ref Rng & Units 09/08/2020 08/18/2020 06/11/2020  Total Protein 6.0 - 8.3 g/dL 6.3 6.6 6.7  Albumin  3.5 - 5.2 g/dL 3.6 3.8 3.4(L)  AST 0 - 37 U/L $Remo'10 12 15  'snPUS$ ALT 0 - 35 U/L $Remo'5 7 9  'lgNxI$ Alk Phosphatase 39 - 117 U/L 64 80 64  Total Bilirubin 0.2 - 1.2 mg/dL 0.4 0.2 0.3  Bilirubin, Direct 0.0 - 0.3 mg/dL 0.1 - -    Lab Results  Component Value Date/Time   TSH 3.57 08/18/2020 12:03 PM   TSH 1.77 03/14/2020 02:39 PM   FREET4 0.85 01/24/2018 03:04 PM    CBC Latest Ref Rng & Units 09/08/2020 06/11/2020 01/23/2018  WBC 4.0 - 10.5 K/uL 7.3 9.1  8.3  Hemoglobin 12.0 - 15.0 g/dL 11.6(L) 11.5(L) 11.9(L)  Hematocrit 36.0 - 46.0 % 36.1 38.1 36.6  Platelets 150.0 - 400.0 K/uL 219.0 219 190.0    No results found for: VD25OH  Clinical ASCVD: Yes  The ASCVD Risk score Mikey Bussing DC Jr., et al., 2013) failed to calculate for the following reasons:   The 2013 ASCVD risk score is only valid for ages 71 to 16   The patient has a prior MI or stroke diagnosis    Other:  DEXA 03/05/2014             AP LUMBAR SPINE not used for calculation due to significant degenerative changes.             Left FOREARM (1/3 RADIUS) T Score: -0.4             Left FEMUR neck T-Score: -0.7     Social History   Tobacco Use  Smoking Status Former   Types: Cigarettes   Quit date: 04/24/2000   Years since quitting: 20.6  Smokeless Tobacco Never   BP Readings from Last 3 Encounters:  09/17/20 100/60  09/08/20 100/63  08/18/20 (!) 99/52   Pulse Readings from Last 3 Encounters:  09/17/20 87  09/08/20 65  08/18/20 83   Wt Readings from Last 3 Encounters:  09/17/20 205 lb (93 kg)  09/08/20 202 lb 9.6 oz (91.9 kg)  08/18/20 202 lb (91.6 kg)    Assessment: Review of patient past medical history, allergies, medications, health status, including review of consultants reports, laboratory and other test data, was performed as part of comprehensive evaluation and provision of chronic care management services.   SDOH:  (Social Determinants of Health) assessments and interventions performed:    CCM Care Plan  Allergies  Allergen Reactions   Ibuprofen Other (See Comments)    Pt has hx of peptic ulcer and diverticulitis.     Medications Reviewed Today     Reviewed by Cherre Robins, PharmD (Pharmacist) on 12/02/20 at 25  Med List Status: <None>   Medication Order Taking? Sig Documenting Provider Last Dose Status Informant  albuterol (PROVENTIL) (2.5 MG/3ML) 0.083% nebulizer solution [id number]  USE 1 VIAL IN NEBULIZER EVERY 6 HOURS AS NEEDED FOR WHEEZING  AND FOR SHORTNESS OF [name], Lenna Sciara, NP  Active   allopurinol (ZYLOPRIM) 100 MG tablet [id number]  Take 2 tablets (200 mg total) by mouth daily. Debbrah Alar, NP  Active   aspirin 81 MG EC tablet [id number]  Take 1 tablet (81 mg total) by mouth daily. Swallow whole. Tobb, Kardie, DO  Active   atorvastatin (LIPITOR) 80 MG tablet [id number] Yes Take 1 tablet (80 mg total) by mouth daily. Berniece Salines, DO Taking Active   colchicine 0.6 MG tablet [id number]  Take 0.6 mg by mouth daily as needed (gout). [provider]  Active   diazepam (VALIUM) 10  MG tablet [id number]  TAKE ONE TABLET BY MOUTH EVERY TWELVE HOURS AS NEEDED FOR ANXIETY Debbrah Alar, NP  Active   dicyclomine (BENTYL) 10 MG capsule [id number]  Take 1 capsule (10 mg total) by mouth in the morning, at noon, in the evening, and at bedtime. Debbrah Alar, NP  Active   escitalopram (LEXAPRO) 20 MG tablet [id number]  Take 1 tablet (20 mg total) by mouth daily. Debbrah Alar, NP  Active   ezetimibe (ZETIA) 10 MG tablet [id number] Yes Take 1 tablet (10 mg total) by mouth daily. Tobb, Kardie, DO Taking Active   famotidine (PEPCID) 20 MG tablet [id number]  Take 1 tablet (20 mg total) by mouth 2 (two) times daily. Cirigliano, Vito V, DO  Active   isosorbide mononitrate (IMDUR) 30 MG 24 hr tablet [id number]  Take 0.5 tablets (15 mg total) by mouth daily. Berniece Salines, DO  Active   levothyroxine (EUTHYROX) 25 MCG tablet [id number]  TAKE 1 TABLET BY MOUTH ONCE DAILY BEFORE Coralyn Pear, Lenna Sciara, NP  Active   lisinopril (ZESTRIL) 2.5 MG tablet [id number]  Take 1 tablet (2.5 mg total) by mouth daily. Tobb, Kardie, DO  Active   meclizine (ANTIVERT) 25 MG tablet [id number]  TAKE 1 TABLET BY MOUTH TWICE DAILY AS NEEDED FOR Andi Hence, Lenna Sciara, NP  Active   metoprolol succinate (TOPROL-XL) 25 MG 24 hr tablet [id number]  Take 0.5 tablets (12.5 mg total) by mouth daily. Tobb, Kardie, DO  Active   nitroGLYCERIN  (NITROSTAT) 0.4 MG SL tablet [id number]  Place 1 tablet (0.4 mg total) under the tongue every 5 (five) minutes as needed for chest pain. Tobb, Kardie, DO  Active   nystatin (NYSTATIN) powder [id number]  APPLY  POWDER TOPICALLY TO AFFECTED AREA 2 TIMES DAILY  Patient taking differently: APPLY  POWDER TOPICALLY TO AFFECTED AREA 2 TIMES DAILY   Debbrah Alar, NP  Active   pantoprazole (PROTONIX) 40 MG tablet [id number]  Take 1 tablet by mouth twice daily  Patient taking differently: Take 40 mg by mouth 2 (two) times daily.   Debbrah Alar, NP  Active   potassium chloride SA (KLOR-CON) 20 MEQ tablet [id number]  Take 1 tablet (20 mEq total) by mouth daily. Debbrah Alar, NP  Active   sucralfate (CARAFATE) 1 g tablet [id number]  TAKE ONE TABLET BY MOUTH FOUR TIMES DAILY. MAY cut pill in half AND DISSOLVE in water prior TO drinking  Patient taking differently: Take 1 g by mouth every 4 (four) hours.   Lavena Bullion, DO  Active             Patient Active Problem List   Diagnosis Date Noted   Fatty liver 12/01/2020   Other chest pain 09/17/2020   Coronary artery disease involving native coronary artery of native heart 40/81/4481   Metabolic syndrome 85/63/1497   Prediabetes 09/17/2020   Allergy 09/16/2020   Anxiety 09/16/2020   Hypertension 09/16/2020   Vertigo 09/16/2020   Dark stools 09/08/2020   Dysphagia    Esophageal stricture    Hiatal hernia    Hypothyroid 01/25/2018   Mild CAD 10/03/2017   Stress-induced cardiomyopathy 09/19/2017   Hypokalemia 09/14/2017   Non-ST elevation (NSTEMI) myocardial infarction [facility]) - due to Demand Infarction 09/14/2017   CKD (chronic kidney disease) 04/04/2017   HLD (hyperlipidemia)    Allergic rhinitis    Gout 02/28/2017   Depression with anxiety 02/28/2017   Epigastric abdominal pain 12/22/2016   COPD with chronic bronchitis ([place]) 12/22/2016   GERD (gastroesophageal reflux  disease) 12/22/2016   DOE (dyspnea on exertion)  12/22/2016   Peptic ulcer 01/04/2003    Immunization History  Administered Date(s) Administered   Fluad Quad(high Dose 65+) 03/14/2020   Influenza, High Dose Seasonal PF 01/23/2018, 12/07/2018   Influenza-Unspecified 12/23/2018   PFIZER(Purple Top)SARS-COV-2 Vaccination 05/14/2019, 06/08/2019   Pneumococcal Conjugate-13 07/16/2015   Pneumococcal Polysaccharide-23 12/23/2016   Tdap 09/02/2009, 04/05/2017    Conditions to be addressed/monitored: CAD, HTN, HLD, COPD, Anxiety, Depression and gout, GERD ; hypothyroidism  Care Plan : General Pharmacy (Adult)  Updates made by Cherre Robins, PHARMD since 12/02/2020 12:00 AM     Problem: Medication management and Monitoring and Coordination of Care for Chronic Conditions   Priority: High  Onset Date: 09/02/2020     Long-Range Goal: Patient Stated   Start Date: 09/02/2020  Recent Progress: On track  Priority: High  Note:     Current Barriers:  Unable to achieve control of hyperlipidemia  Does not adhere to prescribed medication regimen Does not maintain contact with provider office  Pharmacist Clinical Goal(s):  Over the next 90 days, patient will achieve adherence to monitoring guidelines and medication adherence to achieve therapeutic efficacy achieve control of hyperlipidemia as evidenced by LDL < 70 adhere to prescribed medication regimen as evidenced by filling prescriptions on times contact provider office for questions/concerns as evidenced notation of same in electronic health record through collaboration with PharmD and provider.   Interventions: 1:1 collaboration with Debbrah Alar, NP regarding development and update of comprehensive plan of care as evidenced by provider attestation and co-signature Inter-disciplinary care team collaboration (see longitudinal plan of care) Comprehensive medication review performed; medication list updated in electronic medical record  Hypertension: Controlled; BP goal is:   <130/80 Previous Office BP low at 95/52  Patient is not checking BP at home - states she does not have BP cuff.  Patient has failed these meds in the past: None noted  Current Regimen: Metoprolol Succinate 25mg  0.5 tab daily Lisinopril 2.5mg  daily Interventions:  Reviewed signs and symptoms of low BP to monitor for.  Discussed BP goal Recommended get BP cuff and check BP 2 to 3 times per week.  Continue current medications    Hyperlipidemia / history of MI: Not controlled; LDL goal < 70  Patient has failed these meds in past: None noted  Statin has not been filled since 08/12/2020 Patient states she stopped taking atorvastatin and does not think she has started ezetimbe although it was filled in June and July.  Current therapy Atorvastatin 80mg  daily (increased 08/18/2020) Ezetimibe 10mg  daily (added 09/17/2020)  Metoprolol Succinate 25mg  1/2 tab daily Aspirin 81mg  daily  Interventions:  Discussed LDL goal and recent LDL result Discussed importance of taking atorvastatin daily to both lower cholesterol and prevent ASCVD / recurrent MI Patient to come in for face to face visit tomorrow to review medications  Chronic Obstructive Pulmonary Disease: Controlled Current treatment: Albuterol solution for nebulizer - use in nebulizer up to every 6 hours as needed for shortness of breath No exacerbations requiring treatment in the last 6 months  Interventions: None  Depression/Anxiety:  Controlled Patient has failed these meds in past: citalopram (inefficacy?) Current treatment: Escitalopram 20mg  daily Diazepam 10mg  every 12 hours as needed  Interventions: (addressed at previous visits)  Continue current therapy  Hypothyroidism:  TSH was WNL at last check on 08/18/2020 Patient has failed these meds in past: None noted  Patient is currently controlled on the following medications:  Euthyrox 53mcg daily  Intervention: (  addressed at previous visits)  Continue current therapy    Gout:   Goal Uric Acid < 6 mg/dL   Last uric acid was not at goal (UA = 8.4 on 03/14/2020) but patient denies symptoms of gout at this time.  Current medications:  Allopurinol 100mg  - take 2 tablets = 200mg  daily Colchicine 0.6mg  up to bid as needed for acute gout Medications that may increase uric acid levels: Aspirin Last gout flare: Feb 2022 Last allopurinol refill was 08/12/2020 for 90 DS Patient has failed these meds in past: None noted   Interventions:  Reviewed preventative versus treatment medications for gout.  Continue current therapy Consider checking Uric acid with next labs.    Medication management:   Current pharmacy:  [facility] and  [place] refills for diazepam, Nystatin ([facility] to transfer nystatin from [place])  Intervention:  Recommended she get pill splitter to make cutting easier. Asked [facility] to order and send with next delivery order.  If any issues in future could switch atorvastatin to rosuvastatin 40mg  which is a smaller tablet.  Patient to come in for face to face visit tomorrow to review medications   Patient also states since she returned from [place] (went for her brother's funeral. Returned yesterday 12/01/20) that she has had back pain and suspect possibly a UTI. Appointment made fro 12/03/2020 to see PCP at 10am.  I will see patient afterward to go over medications.   Patient Goals/Self-Care Activities Over the next 90 days, patient will:  take medications as prescribed, focus on medication adherence by filing prescriptions at one pharmacy, and call if any issues with medications - especially with splitting tablets or swallowing tablets.   Follow Up Plan:  I will see patient tomorrow 12/03/2020 to review medication face to face                        Medication Assistance: None required.  Patient affirms current coverage meets needs.  Patient's preferred pharmacy is:  [place] [place] [address] Phone: [phone] Fax: [phone]  [facility] - [place], [place] [address] [place] [address] Phone: [phone] Fax: [phone]  Follow Up:  Patient agrees to Care Plan and Follow-up.  Plan: Face to face appointment planned for tomorrow 12/03/2020  Cherre Robins, PharmD Clinical Pharmacist [place] [facility] [phone]

## 2020-12-02 NOTE — Patient Instructions (Signed)
Visit Information  PATIENT GOALS:  Goals Addressed             This Visit's Progress    Chronic Care Management Pharmacy Care Plan   Not on track    CARE PLAN ENTRY (see longitudinal plan of care for additional care plan information)  Current Barriers:  Chronic Disease Management support, education, and care coordination needs related to Hyperlipidemia/Hx of MI, Pre-Diabetes, Hypothyroidism, Depression/Anxiety, GERD, Gout    Hypertension Screening BP Readings from Last 3 Encounters:  09/17/20 100/60  09/08/20 100/63  08/18/20 (!) 99/52  Pharmacist Clinical Goal(s): Over the next 90 days, patient will work with PharmD and providers to maintain BP goal <130/80 Current regimen:  Metoprolol Succinate 25mg  0.5 tab daily Lisinopril 2.5mg  daily Interventions: Discussed blood pressure goal Consider purchasing blood pressure cuff for home use and check blood pressure 2 to 3 times per week and record Patient self care activities - Over the next 90 days, patient will: Maintain blood pressure less than 130/80 Consider purchasing blood pressure cuff and check blood pressure 2 to 3 times per week and record  Hyperlipidemia/History of MI Lab Results  Component Value Date/Time   LDLCALC 125 (H) 08/18/2020 12:03 PM   LDLCALC 108 (H) 03/14/2020 02:39 PM  Pharmacist Clinical Goal(s): Over the next 90 days, patient will work with PharmD and providers to achieve LDL goal < 70 Current regimen:  Atorvastatin 80mg  daily  Exetimibe 10mg  daily Aspirin 81mg  daily  Metoprolol succinate ER 25mg  - take 0.5 tablet daily Interventions: Discussed LDL goal and importance of medication adherence Repeat LDL at next office visit since zetimibe started Discussed adherence Patient self care activities - Over the next 90 days, patient will: Maintain cholesterol medication regimen.  Come in for face to face visit tomorrow to review medications  Anxiety / Depression:  Pharmacist Clinical Goal(s): Over  the next 45 days, patient will work with PharmD and providers to maintain control of anxiety and depression symptoms Current regimen:  Escitalopram 20mg  daily  Diazepam 10mg  take 1 tablet every 12 hours if needed for anxiety Interventions: Addressed at previous visit Patient self care activities - Over the next 90 days, patient will: Maintain current medication regimen for anxiety and depression   Gout:  Pharmacist Clinical Goal(s): Over the next 90 days, patient will work with PharmD and providers to prevent acute gout episodes / lower uric acid Current regimen:  Allopurinol 100mg  - take 2 tablets = 200mg  daily (prevention) Colchicine 0.6mg  take up to twice a day as needed for gout attack Interventions: Discussed difference between maintenance / presentation medication and treatment Patient self care activities - Over the next 90 days, patient will: Maintain current medication regimen for gout  Pre-Diabetes Lab Results  Component Value Date/Time   HGBA1C 6.2 (H) 09/17/2020 10:38 AM   HGBA1C 6.1 06/12/2018 01:58 PM  Pharmacist Clinical Goal(s): Over the next 90 days, patient will work with PharmD and providers to maintain A1c goal <6.5% Current regimen:  Diet and exercise management   Interventions: Discussed the importance of limiting carbohydrates (30-45 grams per meal) Patient self care activities - Over the next 90 days, patient will: Maintain a1c <6.5%  Hypothyroidism Pharmacist Clinical Goal(s) Over the next 90 days, patient will work with PharmD and providers to maintain TSH within normal limits and reduce risk of symptoms associated with hypothyroidism Current regimen:  Euthyrox daily Interventions: Discussed importance of medication adherence Reminded to separate Euthyrox / levothyroxine dose by 30 minutes from breakfast and morning  medications Patient self care activities - Over the next 90 days, patient will: Maintain hypothyroidism medication  regimen  Medication management Pharmacist Clinical Goal(s): Over the next 90 days, patient will work with PharmD and providers to achieve optimal medication adherence Current pharmacy: [facility] switched to [facility] Interventions Comprehensive medication review performed. Utilize [facility] pharmacy for medication synchronization, packaging and delivery Patient having difficulty swallowing atorvastatin but is halving tablets. She asked if she could get her pharmacy to deliver a pill cutter with her next deliver. Will coordinate with there pharmacy Patient self care activities - Over the next 90 days, patient will: Focus on medication adherence by filling and taking medications appropriately  Take medications as prescribed Report any questions or concerns to PharmD and/or provider(s) Contact [name], PharmD if any issues with cost of medications or difficulty swallowing tablets.   Please see past updates related to this goal by clicking on the "Past Updates" button in the selected goal          The patient verbalized understanding of instructions, educational materials, and care plan provided today and declined offer to receive copy of patient instructions, educational materials, and care plan.   Face to face planned for tomorrow  [name], PharmD Clinical Pharmacist [facility]

## 2020-12-03 ENCOUNTER — Ambulatory Visit (INDEPENDENT_AMBULATORY_CARE_PROVIDER_SITE_OTHER): Payer: Medicare HMO | Admitting: Family

## 2020-12-03 ENCOUNTER — Telehealth: Payer: Self-pay | Admitting: Pharmacist

## 2020-12-03 ENCOUNTER — Other Ambulatory Visit: Payer: Self-pay | Admitting: Gastroenterology

## 2020-12-03 ENCOUNTER — Other Ambulatory Visit: Payer: Self-pay

## 2020-12-03 ENCOUNTER — Ambulatory Visit: Payer: Medicare HMO | Admitting: Pharmacist

## 2020-12-03 ENCOUNTER — Telehealth: Payer: Self-pay | Admitting: Family

## 2020-12-03 ENCOUNTER — Ambulatory Visit (HOSPITAL_BASED_OUTPATIENT_CLINIC_OR_DEPARTMENT_OTHER)
Admission: RE | Admit: 2020-12-03 | Discharge: 2020-12-03 | Disposition: A | Discharge status: Home / Self Care | Payer: Medicare HMO | Source: Ambulatory Visit | Attending: Family | Admitting: Family

## 2020-12-03 VITALS — BP 110/55 | HR 87 | Temp 98.5°F | Resp 16 | Ht 64.0 in | Wt 188.0 lb

## 2020-12-03 DIAGNOSIS — E039 Hypothyroidism, unspecified: Secondary | ICD-10-CM

## 2020-12-03 DIAGNOSIS — I2510 Atherosclerotic heart disease of native coronary artery without angina pectoris: Secondary | ICD-10-CM

## 2020-12-03 DIAGNOSIS — E785 Hyperlipidemia, unspecified: Secondary | ICD-10-CM

## 2020-12-03 DIAGNOSIS — M549 Dorsalgia, unspecified: Secondary | ICD-10-CM

## 2020-12-03 DIAGNOSIS — F419 Anxiety disorder, unspecified: Secondary | ICD-10-CM | POA: Diagnosis not present

## 2020-12-03 DIAGNOSIS — F4321 Adjustment disorder with depressed mood: Secondary | ICD-10-CM

## 2020-12-03 DIAGNOSIS — N3000 Acute cystitis without hematuria: Secondary | ICD-10-CM | POA: Insufficient documentation

## 2020-12-03 DIAGNOSIS — Z23 Encounter for immunization: Secondary | ICD-10-CM

## 2020-12-03 DIAGNOSIS — R413 Other amnesia: Secondary | ICD-10-CM

## 2020-12-03 DIAGNOSIS — F4320 Adjustment disorder, unspecified: Secondary | ICD-10-CM | POA: Insufficient documentation

## 2020-12-03 MED ORDER — METHYLPREDNISOLONE 4 MG PO TBPK: ORAL_TABLET | ORAL | 0 refills | Status: DC

## 2020-12-03 MED ORDER — METHYLPREDNISOLONE 4 MG PO TBPK
ORAL_TABLET | ORAL | 0 refills | Status: DC
Start: 2020-12-03 — End: 2020-12-03

## 2020-12-03 MED ORDER — PANTOPRAZOLE SODIUM 40 MG PO TBEC: 40.0000 mg | DELAYED_RELEASE_TABLET | Freq: Two times a day (BID) | ORAL | 0 refills | Status: DC

## 2020-12-03 NOTE — Progress Notes (Signed)
Subjective:   By signing my name below, I, [name], attest that this documentation has been prepared under the direction and in the presence of [name] NP. 12/03/2020    Patient ID: Veronica Wolf, female    DOB: 12-16-1940, 80 y.o.   MRN: [id number]  Chief Complaint  Patient presents with   Back Pain    Complains of back pain    Back Pain  Patient is in today for a office visit.  Back pain- She complains of back pain for the past couple of weeks. She reports that before getting on a flight to [place] she pulled her muscle and felt pain since then.  UTI- She complains of urinary issues similar to her previous UTI. She was She had gotten an ultra sound while in [place] and found no issues. She was given ciprofloxacin to manage her symptoms which she is still taking at this time. She notes that her symptoms have improved while she is taking it. She denies having any blood in the urine and fevers at this time.  Mood- She reports her brother had recently passed away. She is grieving at this time and reports her mood has worsened since then.  Immunizations- She is willing to get a flu vaccine during this visit.    Health Maintenance Due  Topic Date Due   Zoster Vaccines- Shingrix (1 of 2) Never done   COVID-19 Vaccine (3 - Pfizer risk series) 07/06/2019    Past Medical History:  Diagnosis Date   Allergic rhinitis    Allergy    seasonal allergies   Anxiety    CKD (chronic kidney disease) 04/04/2017   COPD (chronic obstructive pulmonary disease) (HCC)    uses inhaler   Depression    Fatty liver    GERD (gastroesophageal reflux disease)    Gout    hx of   Hyperlipidemia    on meds   Hypertension    on meds   Myocardial infarction [facility])    Peptic ulcer 01/04/2003   Vertigo     Past Surgical History:  Procedure Laterality Date   ABDOMINAL HYSTERECTOMY     APPENDECTOMY     BIOPSY  08/13/2020   Procedure: BIOPSY;  Surgeon: Shellia Cleverly, DO;   Location: [facility];  Service: Gastroenterology;;   ESOPHAGOGASTRODUODENOSCOPY (EGD) WITH PROPOFOL N/A 08/13/2020   Procedure: ESOPHAGOGASTRODUODENOSCOPY (EGD) WITH PROPOFOL;  Surgeon: Shellia Cleverly, DO;  Location: [facility];  Service: Gastroenterology;  Laterality: N/A;   LEFT HEART CATH AND CORONARY ANGIOGRAPHY N/A 06/13/2017   Procedure: LEFT HEART CATH AND CORONARY ANGIOGRAPHY;  Surgeon: Marykay Lex, MD;  Location: [facility] INV[facility]IVE CV LAB;  Service: Cardiovascular;  Laterality: N/A;   RIGHT/LEFT HEART CATH AND CORONARY ANGIOGRAPHY N/A 09/16/2017   Procedure: RIGHT/LEFT HEART CATH AND CORONARY ANGIOGRAPHY;  Surgeon: Swaziland, Peter M, MD;  Location: [facility] INVASIVE CV LAB;  Service: Cardiovascular;  Laterality: N/A;   SAVORY DILATION N/A 08/13/2020   Procedure: SAVORY DILATION;  Surgeon: Shellia Cleverly, DO;  Location: [facility];  Service: Gastroenterology;  Laterality: N/A;   ULTRASOUND GUIDANCE FOR VASCULAR ACCESS  09/16/2017   Procedure: Ultrasound Guidance For Vascular Access;  Surgeon: Swaziland, Peter M, MD;  Location: [facility] INVASIVE CV LAB;  Service: Cardiovascular;;   WISDOM TOOTH EXTRACTION      Family History  Problem Relation Age of Onset   Breast cancer Mother    Stroke Father    Stomach cancer Neg Hx    Colon cancer  Neg Hx    Pancreatic cancer Neg Hx    Esophageal cancer Neg Hx    Colon polyps Neg Hx    Rectal cancer Neg Hx     Social History   Socioeconomic History   Marital status: Widowed    Spouse name: Not on file   Number of children: 1   Years of education: Not on file   Highest education level: Not on file  Occupational History   Occupation: Retired  Tobacco Use   Smoking status: Former    Types: Cigarettes    Quit date: 04/24/2000    Years since quitting: 20.6   Smokeless tobacco: Never  Vaping Use   Vaping Use: Never used  Substance and Sexual Activity   Alcohol use: Not Currently    Comment: has previous hx of ETOH abuse, quit 2014   Drug use:  No   Sexual activity: Never  Other Topics Concern   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[place]   Grew up in [place]   Has daughter locally   Social Determinants of Corporate investment banker Strain: Not on file  Food Insecurity: No Food Insecurity   Worried About Programme researcher, broadcasting/film/video in the Last Year: Never true   Barista in the Last Year: Never true  Transportation Needs: Personal assistant (Medical): No   Lack of Transportation (Non-Medical): Yes  Physical Activity: Not on file  Stress: Not on file  Social Connections: Not on file  Intimate Partner Violence: Not on file    Outpatient Medications Prior to Visit  Medication Sig Dispense Refill   albuterol (PROVENTIL) (2.5 MG/3ML) 0.083% nebulizer solution USE 1 VIAL IN NEBULIZER EVERY 6 HOURS AS NEEDED FOR WHEEZING AND FOR SHORTNESS OF BREATH 150 mL 0   allopurinol (ZYLOPRIM) 100 MG tablet Take 2 tablets (200 mg total) by mouth daily. 180 tablet 1   aspirin 81 MG EC tablet Take 1 tablet (81 mg total) by mouth daily. Swallow whole. (Patient not taking: Reported on 12/03/2020) 30 tablet 12   atorvastatin (LIPITOR) 80 MG tablet Take 1 tablet (80 mg total) by mouth daily. (Patient not taking: Reported on 12/03/2020) 90 tablet 3   colchicine 0.6 MG tablet Take 0.6 mg by mouth daily as needed (gout). (Patient not taking: Reported on 12/03/2020)     diazepam (VALIUM) 10 MG tablet TAKE ONE TABLET BY MOUTH EVERY TWELVE HOURS AS NEEDED FOR ANXIETY 45 tablet 0   dicyclomine (BENTYL) 10 MG capsule Take 1 capsule (10 mg total) by mouth in the morning, at noon, in the evening, and at bedtime. 360 capsule 1   escitalopram (LEXAPRO) 20 MG tablet Take 1 tablet (20 mg total) by mouth daily. (Patient not taking: Reported on 12/03/2020) 90 tablet 1   ezetimibe (ZETIA) 10 MG tablet Take 1 tablet (10 mg total) by mouth daily. (Patient not taking: Reported on 12/03/2020) 90  tablet 3   famotidine (PEPCID) 20 MG tablet Take 1 tablet (20 mg total) by mouth 2 (two) times daily. (Patient not taking: Reported on 12/03/2020) 60 tablet 3   isosorbide mononitrate (IMDUR) 30 MG 24 hr tablet Take 0.5 tablets (15 mg total) by mouth daily. 45 tablet 3   levothyroxine (EUTHYROX) 25 MCG tablet TAKE 1 TABLET BY MOUTH ONCE DAILY BEFORE BREAKFAST (Patient not taking: Reported on 12/03/2020) 90 tablet 1   lisinopril (ZESTRIL) 2.5 MG tablet  Take 1 tablet (2.5 mg total) by mouth daily. 90 tablet 3   metoprolol succinate (TOPROL-XL) 25 MG 24 hr tablet Take 0.5 tablets (12.5 mg total) by mouth daily. 45 tablet 3   nitroGLYCERIN (NITROSTAT) 0.4 MG SL tablet Place 1 tablet (0.4 mg total) under the tongue every 5 (five) minutes as needed for chest pain. 45 tablet 3   nystatin (NYSTATIN) powder APPLY  POWDER TOPICALLY TO AFFECTED AREA 2 TIMES DAILY (Patient taking differently: APPLY  POWDER TOPICALLY TO AFFECTED AREA 2 TIMES DAILY) 60 g 1   pantoprazole (PROTONIX) 40 MG tablet Take 1 tablet by mouth twice daily (Patient taking differently: Take 40 mg by mouth 2 (two) times daily.) 180 tablet 0   potassium chloride SA (KLOR-CON) 20 MEQ tablet Take 1 tablet (20 mEq total) by mouth daily. 90 tablet 1   sucralfate (CARAFATE) 1 g tablet TAKE ONE TABLET BY MOUTH FOUR TIMES DAILY. MAY cut pill in half AND DISSOLVE in water prior TO drinking (Patient not taking: Reported on 12/03/2020) 120 tablet 0   meclizine (ANTIVERT) 25 MG tablet TAKE 1 TABLET BY MOUTH TWICE DAILY AS NEEDED FOR DIZZINESS (Patient not taking: Reported on 12/03/2020) 30 tablet 0   No facility-administered medications prior to visit.    Allergies  Allergen Reactions   Ibuprofen Other (See Comments)    Pt has hx of peptic ulcer and diverticulitis.     Review of Systems  Musculoskeletal:  Positive for back pain (lower middle back).      Objective:    Physical Exam Constitutional:      General: She is not in acute distress.     Appearance: Normal appearance. She is not ill-appearing.  HENT:     Head: Normocephalic and atraumatic.     Right Ear: External ear normal.     Left Ear: External ear normal.  Eyes:     Extraocular Movements: Extraocular movements intact.     Pupils: Pupils are equal, round, and reactive to light.  Cardiovascular:     Rate and Rhythm: Normal rate and regular rhythm.     Heart sounds: Normal heart sounds. No murmur heard.   No gallop.  Pulmonary:     Effort: Pulmonary effort is normal. No respiratory distress.     Breath sounds: Normal breath sounds. No wheezing or rales.  Abdominal:     Tenderness: There is abdominal tenderness in the epigastric area. There is no right CVA tenderness or left CVA tenderness.  Skin:    General: Skin is warm and dry.  Neurological:     Mental Status: She is alert and oriented to person, place, and time.  Psychiatric:        Behavior: Behavior normal.        Judgment: Judgment normal.    BP (!) 110/55 (BP Location: Right Arm, Patient Position: Sitting, Cuff Size: Small)   Pulse 87   Temp 98.5 F (36.9 C) (Oral)   Resp 16   Ht 5\' 4"  (1.626 m)   Wt 188 lb (85.3 kg)   SpO2 96%   BMI 32.27 kg/m  Wt Readings from Last 3 Encounters:  12/03/20 188 lb (85.3 kg)  09/17/20 205 lb (93 kg)  09/08/20 202 lb 9.6 oz (91.9 kg)       Assessment & Plan:   Problem List Items Addressed This Visit       Unprioritized   Grief reaction    Patient appears to be grieving appropriately.  Monitor.  Back pain - Primary    Uncontrolled. Will rx with Medrol dose pak and obtain imaging as below.       Relevant Medications   methylPREDNISolone (MEDROL DOSEPAK) 4 MG TBPK tablet   Other Relevant Orders   DG Lumbar Spine Complete   DG Thoracic Spine 2 View   POCT Urinalysis Dipstick (Automated)   Urine Culture   Acute cystitis without hematuria    Improving with cipro she obtained from another provider.  She is advised to complete cipro.         Other Visit Diagnoses     Needs flu shot       Relevant Orders   Flu Vaccine QUAD High Dose(Fluad) (Completed)        Meds ordered this encounter  Medications   DISCONTD: methylPREDNISolone (MEDROL DOSEPAK) 4 MG TBPK tablet    Sig: Please take per package instructions.    Dispense:  21 tablet    Refill:  0    Order Specific Question:   Supervising Provider    Answer:   Danise Edge A [[id number]]   methylPREDNISolone (MEDROL DOSEPAK) 4 MG TBPK tablet    Sig: Please take per package instructions.    Dispense:  21 tablet    Refill:  0    Order Specific Question:   Supervising Provider    Answer:   Danise Edge A [[id number]]    I, [name] NP, personally preformed the services described in this documentation.  All medical record entries made by the scribe were at my direction and in my presence.  I have reviewed the chart and discharge instructions (if applicable) and agree that the record reflects my personal performance and is accurate and complete. 12/03/2020   I,[name],acting as a Neurosurgeon for Lemont Fillers, NP.,have documented all relevant documentation on the behalf of Lemont Fillers, NP,as directed by  Lemont Fillers, NP while in the presence of Lemont Fillers, NP.   Lemont Fillers, NP

## 2020-12-03 NOTE — Chronic Care Management (AMB) (Signed)
Chronic Care Management Pharmacy Note  12/03/2020 Name:  Veronica Wolf MRN:  [id number] DOB:  1940-10-17  Summary: Patient brought medication bottle in for face to face review medications and education regarding medication regimen.  Noted that there were several medication bottles that were empty - metoprolol, levothyroxine, escitalopram, pantoprazole, ezetimibe.  Patient also had 2 full bottles of atorvastatin 40mg  and  1 full bottle of atorvastatin 80mg  -  patient reports she just stopped taking  Recommendations/Changes made from today's visit: Assisted patient in requesting needed refills from [facility] and they will be delivered to her 12/04/20.  Discussed reason for taking each medication and importance to take as prescribed.   Subjective: Veronica Wolf is an 80 y.o. year old female who is a primary patient of Debbrah Alar, NP.  The CCM team was consulted for assistance with disease management and care coordination needs.    Engaged with patient face to face for follow up visit in response to provider referral for pharmacy case management and/or care coordination services.   Consent to Services:  The patient was given information about Chronic Care Management services, agreed to services, and gave verbal consent prior to initiation of services.  Please see initial visit note for detailed documentation.   Patient Care Team: Debbrah Alar, NP as PCP - General (Internal Medicine) Revankar, Reita Cliche, MD as PCP - Cardiology (Cardiology) Michiel Cowboy, RN as [place] Management Cherre Robins, PharmD (Pharmacist)  Recent office visits: 09/16/2020 - PCP Phone Call. Ultrasound showed fatty liver. Recommended follow up with GI.  09/08/2020 - PCP Inda Castle) Seen for abdominal pain and diarrhea. Hemoccult negative. Reordered Abdominal CT, LFTs and lipase, CBC. No medication changes 08/18/2020 - PCP Inda Castle) - follow up chronic conditions.  Atorvastatin increased to 80mg  daily after LDL checked and found to be 125; Also added aspirin 81mg  daily due to h/o MI.  Recent consult visits: 09/17/2020 - Cardio (Dr Harriet Masson) CAD / intermittant chest pain. Started low dose Imdue 15mg  daily and ezetimibe 10mg  daily. Ordered nucular stress test  GI - see below / EGD done [facility] visits: 08/13/2020 - EGD at [facility] - Dr Bryan Lemma 06/11/2020 - ED Visit for epigastric pain. Has esophageal stricture  Plan to get CBC, CMP, troponin x2, CT abdomen pelvis.  Will also      contact her GI doctor regarding recommendations. CT abdomen pelvis unremarkable.  Troponin negative x2. Discussed with Dr. Bryan Lemma (her GI doctor).  He states that if the work-up is negative, he plans to follow-up with her next week for a esophageal dilation and      look for stomach ulcer. Prescribed metoclopramide 10mg  q6 hours as needed   Objective:  Lab Results  Component Value Date   CREATININE 1.38 (H) 08/18/2020   CREATININE 1.23 (H) 06/11/2020   CREATININE 1.45 (H) 03/14/2020    Lab Results  Component Value Date   HGBA1C 6.2 (H) 09/17/2020   Last diabetic Eye exam: No results found for: HMDIABEYEEXA  Last diabetic Foot exam: No results found for: HMDIABFOOTEX      Component Value Date/Time   CHOL 204 (H) 08/18/2020 1203   TRIG 182.0 (H) 08/18/2020 1203   HDL 42.30 08/18/2020 1203   CHOLHDL 5 08/18/2020 1203   VLDL 36.4 08/18/2020 1203   LDLCALC 125 (H) 08/18/2020 1203   LDLCALC 108 (H) 03/14/2020 1439    Hepatic Function Latest Ref Rng & Units 09/08/2020 08/18/2020 06/11/2020  Total Protein 6.0 - 8.3 g/dL 6.3 6.6 6.7  Albumin 3.5 - 5.2 g/dL 3.6 3.8 3.4(L)  AST 0 - 37 U/L $Remo'10 12 15  'Boywu$ ALT 0 - 35 U/L $Remo'5 7 9  'CKhwd$ Alk Phosphatase 39 - 117 U/L 64 80 64  Total Bilirubin 0.2 - 1.2 mg/dL 0.4 0.2 0.3  Bilirubin, Direct 0.0 - 0.3 mg/dL 0.1 - -    Lab Results  Component Value Date/Time   TSH 3.57 08/18/2020 12:03 PM   TSH 1.77 03/14/2020  02:39 PM   FREET4 0.85 01/24/2018 03:04 PM    CBC Latest Ref Rng & Units 09/08/2020 06/11/2020 01/23/2018  WBC 4.0 - 10.5 K/uL 7.3 9.1 8.3  Hemoglobin 12.0 - 15.0 g/dL 11.6(L) 11.5(L) 11.9(L)  Hematocrit 36.0 - 46.0 % 36.1 38.1 36.6  Platelets 150.0 - 400.0 K/uL 219.0 219 190.0    No results found for: VD25OH  Clinical ASCVD: Yes  The ASCVD Risk score Mikey Bussing DC Jr., et al., 2013) failed to calculate for the following reasons:   The 2013 ASCVD risk score is only valid for ages 81 to 77   The patient has a prior MI or stroke diagnosis    Other:  DEXA 03/05/2014             AP LUMBAR SPINE not used for calculation due to significant degenerative changes.             Left FOREARM (1/3 RADIUS) T Score: -0.4             Left FEMUR neck T-Score: -0.7     Social History   Tobacco Use  Smoking Status Former   Types: Cigarettes   Quit date: 04/24/2000   Years since quitting: 20.6  Smokeless Tobacco Never   BP Readings from Last 3 Encounters:  12/03/20 (!) 110/55  09/17/20 100/60  09/08/20 100/63   Pulse Readings from Last 3 Encounters:  12/03/20 87  09/17/20 87  09/08/20 65   Wt Readings from Last 3 Encounters:  12/03/20 188 lb (85.3 kg)  09/17/20 205 lb (93 kg)  09/08/20 202 lb 9.6 oz (91.9 kg)    Assessment: Review of patient past medical history, allergies, medications, health status, including review of consultants reports, laboratory and other test data, was performed as part of comprehensive evaluation and provision of chronic care management services.   SDOH:  (Social Determinants of Health) assessments and interventions performed:    CCM Care Plan  Allergies  Allergen Reactions   Ibuprofen Other (See Comments)    Pt has hx of peptic ulcer and diverticulitis.     Medications Reviewed Today     Reviewed by Cherre Robins, PharmD (Pharmacist) on 12/03/20 at 1145  Med List Status: <None>   Medication Order Taking? Sig Documenting Provider Last Dose Status  Informant  albuterol (PROVENTIL) (2.5 MG/3ML) 0.083% nebulizer solution [id number] Yes USE 1 VIAL IN NEBULIZER EVERY 6 HOURS AS NEEDED FOR WHEEZING AND FOR SHORTNESS OF [name], Lenna Sciara, NP Taking Active   allopurinol (ZYLOPRIM) 100 MG tablet [id number] Yes Take 2 tablets (200 mg total) by mouth daily. Debbrah Alar, NP Taking Active   aspirin 81 MG EC tablet [id number] No Take 1 tablet (81 mg total) by mouth daily. Swallow whole.  Patient not taking: Reported on 12/03/2020   Berniece Salines, DO Not Taking Active   atorvastatin (LIPITOR) 80 MG tablet [id number] No Take 1 tablet (80 mg total) by mouth daily.  Patient not taking: Reported on 12/03/2020   Berniece Salines,  DO Not Taking Active   colchicine 0.6 MG tablet [id number] No Take 0.6 mg by mouth daily as needed (gout).  Patient not taking: Reported on 12/03/2020   [provider] Not Taking Active   diazepam (VALIUM) 10 MG tablet [id number] Yes TAKE ONE TABLET BY MOUTH EVERY TWELVE HOURS AS NEEDED FOR ANXIETY Debbrah Alar, NP Taking Active   dicyclomine (BENTYL) 10 MG capsule [id number] Yes Take 1 capsule (10 mg total) by mouth in the morning, at noon, in the evening, and at bedtime. Debbrah Alar, NP Taking Active   escitalopram (LEXAPRO) 20 MG tablet [id number] No Take 1 tablet (20 mg total) by mouth daily.  Patient not taking: Reported on 12/03/2020   Debbrah Alar, NP Not Taking Active   ezetimibe (ZETIA) 10 MG tablet [id number] No Take 1 tablet (10 mg total) by mouth daily.  Patient not taking: Reported on 12/03/2020   Berniece Salines, DO Not Taking Active   famotidine (PEPCID) 20 MG tablet [id number] No Take 1 tablet (20 mg total) by mouth 2 (two) times daily.  Patient not taking: Reported on 12/03/2020   Lavena Bullion, DO Not Taking Active   isosorbide mononitrate (IMDUR) 30 MG 24 hr tablet [id number] Yes Take 0.5 tablets (15 mg total) by mouth daily. Tobb, Godfrey Pick, DO Taking Active   levothyroxine (EUTHYROX)  25 MCG tablet [id number] No TAKE 1 TABLET BY MOUTH ONCE DAILY BEFORE BREAKFAST  Patient not taking: Reported on 12/03/2020   Debbrah Alar, NP Not Taking Active   lisinopril (ZESTRIL) 2.5 MG tablet [id number] Yes Take 1 tablet (2.5 mg total) by mouth daily. Tobb, Kardie, DO Taking Active   meclizine (ANTIVERT) 25 MG tablet [id number] No TAKE 1 TABLET BY MOUTH TWICE DAILY AS NEEDED FOR DIZZINESS  Patient not taking: Reported on 12/03/2020   Debbrah Alar, NP Not Taking Active   methylPREDNISolone (MEDROL DOSEPAK) 4 MG TBPK tablet [id number] Yes Please take per package instructions. Debbrah Alar, NP Taking Active   metoprolol succinate (TOPROL-XL) 25 MG 24 hr tablet [id number] Yes Take 0.5 tablets (12.5 mg total) by mouth daily. Tobb, Kardie, DO Taking Active   nitroGLYCERIN (NITROSTAT) 0.4 MG SL tablet [id number] Yes Place 1 tablet (0.4 mg total) under the tongue every 5 (five) minutes as needed for chest pain. Tobb, Godfrey Pick, DO Taking Active   nystatin (NYSTATIN) powder [id number] Yes APPLY  POWDER TOPICALLY TO AFFECTED AREA 2 TIMES DAILY  Patient taking differently: APPLY  POWDER TOPICALLY TO AFFECTED AREA 2 TIMES DAILY   Debbrah Alar, NP Taking Active   pantoprazole (PROTONIX) 40 MG tablet [id number] Yes Take 1 tablet by mouth twice daily  Patient taking differently: Take 40 mg by mouth 2 (two) times daily.   Debbrah Alar, NP Taking Active   potassium chloride SA (KLOR-CON) 20 MEQ tablet [id number] Yes Take 1 tablet (20 mEq total) by mouth daily. Debbrah Alar, NP Taking Active   sucralfate (CARAFATE) 1 g tablet [id number] No TAKE ONE TABLET BY MOUTH FOUR TIMES DAILY. MAY cut pill in half AND DISSOLVE in water prior TO drinking  Patient not taking: Reported on 12/03/2020   Lavena Bullion, DO Not Taking Active             Patient Active Problem List   Diagnosis Date Noted   Back pain 12/03/2020   Acute cystitis without hematuria 12/03/2020   Grief reaction  12/03/2020   Fatty liver 12/01/2020   Other chest pain 09/17/2020   Coronary artery disease involving native coronary artery of  native heart 91/47/8295   Metabolic syndrome 62/13/0865   Prediabetes 09/17/2020   Allergy 09/16/2020   Anxiety 09/16/2020   Hypertension 09/16/2020   Vertigo 09/16/2020   Dark stools 09/08/2020   Dysphagia    Esophageal stricture    Hiatal hernia    Hypothyroid 01/25/2018   Mild CAD 10/03/2017   Stress-induced cardiomyopathy 09/19/2017   Hypokalemia 09/14/2017   Non-ST elevation (NSTEMI) myocardial infarction [facility]) - due to Demand Infarction 09/14/2017   CKD (chronic kidney disease) 04/04/2017   HLD (hyperlipidemia)    Allergic rhinitis    Gout 02/28/2017   Depression with anxiety 02/28/2017   Epigastric abdominal pain 12/22/2016   COPD with chronic bronchitis ([place]) 12/22/2016   GERD (gastroesophageal reflux disease) 12/22/2016   DOE (dyspnea on exertion) 12/22/2016   Peptic ulcer 01/04/2003    Immunization History  Administered Date(s) Administered   Fluad Quad(high Dose 65+) 03/14/2020, 12/03/2020   Influenza, High Dose Seasonal PF 01/23/2018, 12/07/2018   Influenza-Unspecified 12/23/2018   PFIZER(Purple Top)SARS-COV-2 Vaccination 05/14/2019, 06/08/2019   Pneumococcal Conjugate-13 07/16/2015   Pneumococcal Polysaccharide-23 12/23/2016   Tdap 09/02/2009, 04/05/2017    Conditions to be addressed/monitored: CAD, HTN, HLD, COPD, Anxiety, Depression and gout, GERD ; hypothyroidism  Care Plan : General Pharmacy (Adult)  Updates made by Cherre Robins, PHARMD since 12/03/2020 12:00 AM     Problem: Medication management and Monitoring and Coordination of Care for Chronic Conditions   Priority: High  Onset Date: 09/02/2020     Long-Range Goal: Medication management   Start Date: 09/02/2020  Recent Progress: On track  Priority: High  Note:     Current Barriers:  Unable to achieve control of hyperlipidemia  Does not adhere to prescribed  medication regimen Does not maintain contact with provider office  Pharmacist Clinical Goal(s):  Over the next 90 days, patient will achieve adherence to monitoring guidelines and medication adherence to achieve therapeutic efficacy achieve control of hyperlipidemia as evidenced by LDL < 70 adhere to prescribed medication regimen as evidenced by filling prescriptions on times contact provider office for questions/concerns as evidenced notation of same in electronic health record through collaboration with PharmD and provider.   Interventions: 1:1 collaboration with Debbrah Alar, NP regarding development and update of comprehensive plan of care as evidenced by provider attestation and co-signature Inter-disciplinary care team collaboration (see longitudinal plan of care) Comprehensive medication review performed; medication list updated in electronic medical record  Hypertension: Controlled; BP goal is:  <130/80 Previous Office BP low at 95/52  Patient is not checking BP at home - states she does not have BP cuff.  Patient has failed these meds in the past: None noted  Current Regimen: Metoprolol Succinate 25mg  0.5 tab daily Lisinopril 2.5mg  daily Interventions:  Reviewed refill records and assessed for adherence issues. Discussed importance of taking metoprolol and lisinopril for heart health Reviewed signs and symptoms of low BP to monitor for.  Discussed BP goal Recommended get BP cuff and check BP 2 to 3 times per week.  Continue current medications    Hyperlipidemia / history of MI: Not controlled; LDL goal < 70  Patient has failed these meds in past: None noted  Statin has not been filled since 08/12/2020. Patient brought in bottles of medications and has 2 full bottles of atorvastatin 40mg  and a full bottle of atorvastatin 80mg .  Patient states she just stopped taking atorvastatin because she was taking so many other medications Current therapy Atorvastatin 80mg  daily  (increased 08/18/2020) Ezetimibe 10mg  daily (added 09/17/2020)  Metoprolol Succinate 25mg  1/2 tab daily Aspirin 81mg  daily  Interventions:  Discussed LDL goal and recent LDL results Discussed importance of taking atorvastatin daily to both lower cholesterol and prevent ASCVD / recurrent MI - patient states now that she knows the benefits she will take her cholesterol medications  Chronic Obstructive Pulmonary Disease: Controlled Current treatment: Albuterol solution for nebulizer - use in nebulizer up to every 6 hours as needed for shortness of breath No exacerbations requiring treatment in the last 6 months  Interventions: None  Depression/Anxiety:  Controlled Patient is a little teary today when talking about the recent death of her brother in [place].  Patient has failed these meds in past: citalopram (ineffective) Current treatment: Escitalopram 20mg  daily Diazepam 10mg  every 12 hours as needed  Interventions:  Reviewed refills records - patient is past due to have escitalopram refilled. Discussed adherence and importance of taking escitalopram for depression / anxiety Continue current therapy  Hypothyroidism:  TSH was WNL at last check on 08/18/2020 Patient has failed these meds in past: None noted  Patient is currently controlled on the following medications:  Euthyrox 37mcg daily  Intervention: (addressed at previous visits)  Continue current therapy   Gout:   Goal Uric Acid < 6 mg/dL   Last uric acid was not at goal (UA = 8.4 on 03/14/2020) but patient denies symptoms of gout at this time.  Current medications:  Allopurinol 100mg  - take 2 tablets = 200mg  daily Colchicine 0.6mg  up to bid as needed for acute gout Medications that may increase uric acid levels: Aspirin Last gout flare: Feb 2022 Last allopurinol refill was 08/12/2020 for 90 DS Patient has failed these meds in past: None noted   Interventions:  Reviewed preventative versus treatment medications for  gout.  Continue current therapy Consider checking Uric acid with next labs.    Medication management:   Current pharmacy:  [facility]  [facility] Intervention:  Recommended she get pill splitter/cutter to make cutting tablets easier. Asked [facility] to order and send with next delivery order.   Coordinated refills for needed medications: aspirin 81mg , diazepam, escitalopram, ezetimibe, levothyroxine, meclizine, metoprolol succinate, pantoprazole, and sucralfate. Will be filled at [facility] and delivered to patient  Reviewed each medication and reason for taking. Noted reason for each medication on patient's medication list.  Contacted patient's daughter about assisting with medication weekly boxes to improve adherence.      Patient Goals/Self-Care Activities Over the next 90 days, patient will:  take medications as prescribed, focus on medication adherence by filing prescriptions at one pharmacy, and call if any issues with medications - especially with splitting tablets or swallowing tablets.   Follow Up Plan:  1 - 2 weeks to review medication adherence.                      Medication Assistance: None required.  Patient affirms current coverage meets needs.  Patient's preferred pharmacy is:  [facility], [address] [place] [address] Phone: [phone] Fax: [phone]  [facility] - [place], [place] - [address] [place] [address] Phone: [phone] Fax: [phone]  Follow Up:  Patient agrees to Care Plan and Follow-up.  Plan: phone follow up in 1-2 weeks to check medication adherence.   Cherre Robins, PharmD Clinical Pharmacist [facility] [phone]

## 2020-12-03 NOTE — Patient Instructions (Addendum)
Please complete the cipro (antibiotic) for possible urinary tract infection. I think that your back pain is likely arthritis related. I have ordered some back x-rays for further evaluation. Please complete X-rays  on the first floor before you leave today. Begin Medrol dose pak (steroid) to hopefully help with your back pain.

## 2020-12-03 NOTE — Assessment & Plan Note (Signed)
Uncontrolled. Will rx with Medrol dose pak and obtain imaging as below.

## 2020-12-03 NOTE — Telephone Encounter (Signed)
Rx was cancelled 

## 2020-12-03 NOTE — Assessment & Plan Note (Signed)
Improving with cipro she obtained from another provider.  She is advised to complete cipro.

## 2020-12-03 NOTE — Telephone Encounter (Signed)
Patient was out of several medications;  Contacted [facility] to coordinate refills and have delivered to patient;  They were able to fill the following medications - escitalopram, ezetimibe, levothyroxine, meclizine, metoprolol succinate.  Need refills for:  Diazepam and pantoprazole - will request from PCP

## 2020-12-03 NOTE — Patient Instructions (Signed)
Visit Information  PATIENT GOALS:  Goals Addressed             This Visit's Progress    Chronic Care Management Pharmacy Care Plan   Not on track    CARE PLAN ENTRY (see longitudinal plan of care for additional care plan information)  Current Barriers:  Chronic Disease Management support, education, and care coordination needs related to Hyperlipidemia/Hx of MI, Pre-Diabetes, Hypothyroidism, Depression/Anxiety, GERD, Gout    Hypertension Screening BP Readings from Last 3 Encounters:  12/03/20 (!) 110/55  09/17/20 100/60  09/08/20 100/63  Pharmacist Clinical Goal(s): Over the next 90 days, patient will work with PharmD and providers to maintain BP goal <130/80 Current regimen:  Metoprolol Succinate 25mg  0.5 tab daily Lisinopril 2.5mg  daily Interventions: Discussed blood pressure goal Consider purchasing blood pressure cuff for home use and check blood pressure 2 to 3 times per week and record Reviewed refill records and assessed for adherence issues. Discussed importance of taking metoprolol and lisinopril for heart health Patient self care activities - Over the next 90 days, patient will: Maintain blood pressure less than 130/80 Consider purchasing blood pressure cuff and check blood pressure 2 to 3 times per week and record Continue current medications    Hyperlipidemia / Heart health Lab Results  Component Value Date/Time   LDLCALC 125 (H) 08/18/2020 12:03 PM   LDLCALC 108 (H) 03/14/2020 02:39 PM  Pharmacist Clinical Goal(s): Over the next 90 days, patient will work with PharmD and providers to achieve LDL goal < 70 Current regimen:  Atorvastatin 80mg  daily  Ezetimibe 10mg  daily Aspirin 81mg  daily  Metoprolol succinate ER 25mg  - take 0.5 tablet daily Interventions: Discussed LDL goal and importance of medication adherence Repeat LDL at next office visit since ezetimibe started Discussed adherence Patient self care activities - Over the next 90 days, patient  will: Restart taking atorvastatin 80mg  daily  Continue other medication listed above to lower cholesterol and to prevent heart attack  Anxiety / Depression:  Pharmacist Clinical Goal(s): Over the next 45 days, patient will work with PharmD and providers to maintain control of anxiety and depression symptoms Current regimen:  Escitalopram 20mg  daily  Diazepam 10mg  take 1 tablet every 12 hours if needed for anxiety Interventions: Addressed at previous visit Patient self care activities - Over the next 90 days, patient will: Maintain current medication regimen for anxiety and depression   Gout:  Pharmacist Clinical Goal(s): Over the next 90 days, patient will work with PharmD and providers to prevent acute gout episodes / lower uric acid Current regimen:  Allopurinol 100mg  - take 2 tablets = 200mg  daily (prevention) Colchicine 0.6mg  take up to twice a day as needed for gout attack Interventions: Discussed difference between maintenance / presentation medication and treatment Patient self care activities - Over the next 90 days, patient will: Maintain current medication regimen for gout  Pre-Diabetes Lab Results  Component Value Date/Time   HGBA1C 6.2 (H) 09/17/2020 10:38 AM   HGBA1C 6.1 06/12/2018 01:58 PM  Pharmacist Clinical Goal(s): Over the next 90 days, patient will work with PharmD and providers to maintain A1c goal <6.5% Current regimen:  Diet and exercise management   Interventions: Discussed the importance of limiting carbohydrates (30-45 grams per meal) Patient self care activities - Over the next 90 days, patient will: Maintain a1c <6.5%  Hypothyroidism Pharmacist Clinical Goal(s) Over the next 90 days, patient will work with PharmD and providers to maintain TSH within normal limits and reduce risk of symptoms associated with  hypothyroidism Current regimen:  Euthyrox / levothyroxine daily Interventions: Discussed importance of medication adherence Reminded  to separate Euthyrox / levothyroxine dose by 30 minutes from breakfast and morning medications Patient self care activities - Over the next 90 days, patient will: Maintain hypothyroidism medication regimen  Medication management Pharmacist Clinical Goal(s): Over the next 90 days, patient will work with PharmD and providers to achieve optimal medication adherence Current pharmacy: [facility] switched to [facility] Interventions Comprehensive medication review performed. Utilize [facility] pharmacy for medication synchronization, packaging and delivery Recommended she get pill splitter/cutter to make cutting tablets easier. Asked [facility] to order and send with next delivery order.  Coordinated refills for needed medications: aspirin 81mg , diazepam, escitalopram, ezetimibe, levothyroxine, meclizine, metoprolol succinate, pantoprazole, and sucralfate. Will be filled at [facility] and delivered to patient  Reviewed each medication and reason for taking. Noted reason for each medication on patient's medication list.  Contacted patient's daughter about assisting with medication weekly boxes to improve adherence Patient self care activities - Over the next 90 days, patient will: Focus on medication adherence by filling and taking medications appropriately  Take medications as prescribed Report any questions or concerns to PharmD and/or provider(s) Contact , PharmD if any issues with cost of medications or difficulty swallowing tablets.   Please see past updates related to this goal by clicking on the "Past Updates" button in the selected goal          The patient verbalized understanding of instructions, educational materials, and care plan provided today and declined offer to receive copy of patient instructions, educational materials, and care plan.   Telephone follow up appointment with care management team member scheduled for: 1 to 2 weeks  [name], PharmD Clinical  Pharmacist [facility]

## 2020-12-03 NOTE — Assessment & Plan Note (Signed)
Patient appears to be grieving appropriately.  Monitor.

## 2020-12-03 NOTE — Telephone Encounter (Signed)
Could you please cancel rx for medrol dose pak at [facility]? I resent to [facility].

## 2020-12-05 ENCOUNTER — Other Ambulatory Visit: Payer: Self-pay | Admitting: Family

## 2020-12-05 ENCOUNTER — Other Ambulatory Visit (INDEPENDENT_AMBULATORY_CARE_PROVIDER_SITE_OTHER): Payer: Medicare HMO

## 2020-12-05 DIAGNOSIS — M549 Dorsalgia, unspecified: Secondary | ICD-10-CM | POA: Diagnosis not present

## 2020-12-05 LAB — POC URINALSYSI DIPSTICK (AUTOMATED)
Bilirubin, UA: NEGATIVE
Blood, UA: NEGATIVE
Glucose, UA: NEGATIVE
Ketones, UA: NEGATIVE
Leukocytes, UA: NEGATIVE
Nitrite, UA: NEGATIVE
Protein, UA: NEGATIVE
Spec Grav, UA: 1.02 (ref 1.010–1.025)
Urobilinogen, UA: 0.2 E.U./dL
pH, UA: 5 (ref 5.0–8.0)

## 2020-12-05 MED ORDER — DIAZEPAM 10 MG PO TABS: ORAL_TABLET | ORAL | 0 refills | Status: DC

## 2020-12-05 NOTE — Telephone Encounter (Signed)
Requesting: Contract:03/14/20 UDS:03/14/20 Last Visit:12/03/20 Next Visit:unknown Last Refill:10/24/20  Please Advise

## 2020-12-05 NOTE — Telephone Encounter (Signed)
Opened in error

## 2020-12-05 NOTE — Progress Notes (Signed)
Pt unable to urinate at initial visit. No charge today.

## 2020-12-06 LAB — URINE CULTURE
MICRO NUMBER:: 12327686
Result:: NO GROWTH
SPECIMEN QUALITY:: ADEQUATE

## 2020-12-08 ENCOUNTER — Other Ambulatory Visit: Payer: Self-pay | Admitting: Family

## 2020-12-08 ENCOUNTER — Telehealth: Payer: Self-pay | Admitting: Family

## 2020-12-08 DIAGNOSIS — R42 Dizziness and giddiness: Secondary | ICD-10-CM

## 2020-12-08 DIAGNOSIS — S32000S Wedge compression fracture of unspecified lumbar vertebra, sequela: Secondary | ICD-10-CM

## 2020-12-08 NOTE — Telephone Encounter (Signed)
Please contact pt and let her know that I reviewed her x-rays and she has a lot of arthritis changes, but there is also a chronic fracture in her lower spine. This could be contributing to her severe pain. I would like to refer her to neurorsurgery for further evaluation.

## 2020-12-10 ENCOUNTER — Ambulatory Visit (INDEPENDENT_AMBULATORY_CARE_PROVIDER_SITE_OTHER): Payer: Medicare HMO | Admitting: Pharmacist

## 2020-12-10 DIAGNOSIS — I2510 Atherosclerotic heart disease of native coronary artery without angina pectoris: Secondary | ICD-10-CM

## 2020-12-10 DIAGNOSIS — E785 Hyperlipidemia, unspecified: Secondary | ICD-10-CM

## 2020-12-10 DIAGNOSIS — R413 Other amnesia: Secondary | ICD-10-CM

## 2020-12-10 DIAGNOSIS — E039 Hypothyroidism, unspecified: Secondary | ICD-10-CM

## 2020-12-10 DIAGNOSIS — F418 Other specified anxiety disorders: Secondary | ICD-10-CM

## 2020-12-10 DIAGNOSIS — I10 Essential (primary) hypertension: Secondary | ICD-10-CM

## 2020-12-10 NOTE — Patient Instructions (Signed)
Visit Information  PATIENT GOALS:  Goals Addressed             This Visit's Progress    Chronic Care Management Pharmacy Care Plan   On track    CARE PLAN ENTRY (see longitudinal plan of care for additional care plan information)  Current Barriers:  Chronic Disease Management support, education, and care coordination needs related to Hyperlipidemia/Hx of MI, Pre-Diabetes, Hypothyroidism, Depression/Anxiety, GERD, Gout    Hypertension Screening BP Readings from Last 3 Encounters:  12/03/20 (!) 110/55  09/17/20 100/60  09/08/20 100/63  Pharmacist Clinical Goal(s): Over the next 90 days, patient will work with PharmD and providers to maintain BP goal <130/80 Current regimen:  Metoprolol Succinate 25mg  0.5 tab daily Lisinopril 2.5mg  daily Interventions: Discussed blood pressure goal Consider purchasing blood pressure cuff for home use and check blood pressure 2 to 3 times per week and record Reviewed refill records and assessed for adherence issues. Discussed importance of taking metoprolol and lisinopril for heart health Patient self care activities - Over the next 90 days, patient will: Maintain blood pressure less than 130/80 Consider purchasing blood pressure cuff and check blood pressure 2 to 3 times per week and record Continue current medications    Hyperlipidemia / Heart health Lab Results  Component Value Date/Time   LDLCALC 125 (H) 08/18/2020 12:03 PM   LDLCALC 108 (H) 03/14/2020 02:39 PM  Pharmacist Clinical Goal(s): Over the next 90 days, patient will work with PharmD and providers to achieve LDL goal < 70 Current regimen:  Atorvastatin 80mg  daily  Ezetimibe 10mg  daily Aspirin 81mg  daily  Metoprolol succinate ER 25mg  - take 0.5 tablet daily Interventions: Discussed LDL goal and importance of medication adherence Repeat LDL at next office visit since ezetimibe started Discussed adherence Patient self care activities - Over the next 90 days, patient  will: Continue medications current prescribed to lower cholesterol and to prevent heart attack  Anxiety / Depression:  Pharmacist Clinical Goal(s): Over the next 45 days, patient will work with PharmD and providers to maintain control of anxiety and depression symptoms Current regimen:  Escitalopram 20mg  daily  Diazepam 10mg  take 1 tablet every 12 hours if needed for anxiety Interventions: Addressed at previous visit Patient self care activities - Over the next 90 days, patient will: Maintain current medication regimen for anxiety and depression   Gout:  Pharmacist Clinical Goal(s): Over the next 90 days, patient will work with PharmD and providers to prevent acute gout episodes / lower uric acid Current regimen:  Allopurinol 100mg  - take 2 tablets = 200mg  daily (prevention) Colchicine 0.6mg  take up to twice a day as needed for gout attack Interventions: Discussed difference between maintenance / presentation medication and treatment Patient self care activities - Over the next 90 days, patient will: Maintain current medication regimen for gout  Pre-Diabetes Lab Results  Component Value Date/Time   HGBA1C 6.2 (H) 09/17/2020 10:38 AM   HGBA1C 6.1 06/12/2018 01:58 PM  Pharmacist Clinical Goal(s): Over the next 90 days, patient will work with PharmD and providers to maintain A1c goal <6.5% Current regimen:  Diet and exercise management   Interventions: Discussed the importance of limiting carbohydrates (30-45 grams per meal) Patient self care activities - Over the next 90 days, patient will: Maintain a1c <6.5%  Hypothyroidism Pharmacist Clinical Goal(s) Over the next 90 days, patient will work with PharmD and providers to maintain TSH within normal limits and reduce risk of symptoms associated with hypothyroidism Current regimen:  Euthyrox / levothyroxine  daily Interventions: Discussed importance of medication adherence Reminded to separate Euthyrox / levothyroxine  dose by 30 minutes from breakfast and morning medications Patient self care activities - Over the next 90 days, patient will: Maintain hypothyroidism medication regimen  Medication management Pharmacist Clinical Goal(s): Over the next 90 days, patient will work with PharmD and providers to achieve optimal medication adherence Current pharmacy: [facility] switched to [facility] Interventions Comprehensive medication review performed. Utilize [facility] pharmacy for medication synchronization, packaging and delivery Recommended she get pill splitter/cutter to make cutting tablets easier. Coordinated refills for needed medications: aspirin 81mg , diazepam, escitalopram, ezetimibe, levothyroxine, meclizine, metoprolol succinate, pantoprazole, and sucralfate. Will be filled at [facility] and delivered to patient  Reviewed each medication and reason for taking. Noted reason for each medication on patient's medication list.  Contacted patient's daughter about assisting with medication weekly boxes to improve adherence (done at previous visit)  Patient self care activities - Over the next 90 days, patient will: Focus on medication adherence by filling and taking medications appropriately  Take medications as prescribed Purchase a pill cutter at the pharmacy.  Report any questions or concerns to PharmD and/or provider(s) Contact Samaia Iwata, PharmD if any issues with cost of medications or difficulty swallowing tablets.   Please see past updates related to this goal by clicking on the "Past Updates" button in the selected goal          The patient verbalized understanding of instructions, educational materials, and care plan provided today and declined offer to receive copy of patient instructions, educational materials, and care plan.   Telephone follow up appointment with care management team member scheduled for: 1 to 2 weeks  , PharmD Clinical Pharmacist [facility]  [facility]

## 2020-12-10 NOTE — Chronic Care Management (AMB) (Signed)
Chronic Care Management Pharmacy Note  12/10/2020 Name:  Veronica Wolf MRN:  [id number] DOB:  05-31-1940  Summary: Patient received needed medications from [facility] last week.  Adherence improved.  She reports her back pain has resolved.   Recommendations/Changes made from today's visit: Will continue to follow adherence and assist patient with refills next month.  Also sending her a pill cutter.    Subjective: Veronica Wolf is an 80 y.o. year old female who is a primary patient of Debbrah Alar, NP.  The CCM team was consulted for assistance with disease management and care coordination needs.    Engaged with patient face to face for follow up visit in response to provider referral for pharmacy case management and/or care coordination services.   Consent to Services:  The patient was given information about Chronic Care Management services, agreed to services, and gave verbal consent prior to initiation of services.  Please see initial visit note for detailed documentation.   Patient Care Team: Debbrah Alar, NP as PCP - General (Internal Medicine) Revankar, Reita Cliche, MD as PCP - Cardiology (Cardiology) Michiel Cowboy, RN as [facility] Cherre Robins, PharmD (Pharmacist)  Recent office visits: 12/03/2020- PCP Inda Castle, NP) Seen for back pain. Prescribed Medrol dose pack and ordered Xay. Also checked urinalysis and recommended she complete course of ciprofloxacin given while she was in [place].  09/16/2020 - PCP Phone Call. Ultrasound showed fatty liver. Recommended follow up with GI.  09/08/2020 - PCP Inda Castle) Seen for abdominal pain and diarrhea. Hemoccult negative. Reordered Abdominal CT, LFTs and lipase, CBC. No medication changes 08/18/2020 - PCP Inda Castle) - follow up chronic conditions. Atorvastatin increased to 80mg  daily after LDL checked and found to be 125; Also added aspirin 81mg  daily due to h/o MI.  Recent  consult visits: 09/17/2020 - Cardio (Dr Harriet Masson) CAD / intermittant chest pain. Started low dose Imdue 15mg  daily and ezetimibe 10mg  daily. Ordered nucular stress test  GI - see below / EGD done at [facility] visits: 08/13/2020 - EGD at [facility] - Dr Bryan Lemma 06/11/2020 - ED Visit for epigastric pain. Has esophageal stricture  Plan to get CBC, CMP, troponin x2, CT abdomen pelvis.  Will also      contact her GI doctor regarding recommendations. CT abdomen pelvis unremarkable.  Troponin negative x2. Discussed with Dr. Bryan Lemma (her GI doctor).  He states that if the work-up is negative, he plans to follow-up with her next week for a esophageal dilation and      look for stomach ulcer. Prescribed metoclopramide 10mg  q6 hours as needed   Objective:  Lab Results  Component Value Date   CREATININE 1.38 (H) 08/18/2020   CREATININE 1.23 (H) 06/11/2020   CREATININE 1.45 (H) 03/14/2020    Lab Results  Component Value Date   HGBA1C 6.2 (H) 09/17/2020   Last diabetic Eye exam: No results found for: HMDIABEYEEXA  Last diabetic Foot exam: No results found for: HMDIABFOOTEX      Component Value Date/Time   CHOL 204 (H) 08/18/2020 1203   TRIG 182.0 (H) 08/18/2020 1203   HDL 42.30 08/18/2020 1203   CHOLHDL 5 08/18/2020 1203   VLDL 36.4 08/18/2020 1203   LDLCALC 125 (H) 08/18/2020 1203   LDLCALC 108 (H) 03/14/2020 1439    Hepatic Function Latest Ref Rng & Units 09/08/2020 08/18/2020 06/11/2020  Total Protein 6.0 - 8.3 g/dL 6.3 6.6 6.7  Albumin 3.5 - 5.2 g/dL  3.6 3.8 3.4(L)  AST 0 - 37 U/L $Remo'10 12 15  'VYirR$ ALT 0 - 35 U/L $Remo'5 7 9  'nWDsA$ Alk Phosphatase 39 - 117 U/L 64 80 64  Total Bilirubin 0.2 - 1.2 mg/dL 0.4 0.2 0.3  Bilirubin, Direct 0.0 - 0.3 mg/dL 0.1 - -    Lab Results  Component Value Date/Time   TSH 3.57 08/18/2020 12:03 PM   TSH 1.77 03/14/2020 02:39 PM   FREET4 0.85 01/24/2018 03:04 PM    CBC Latest Ref Rng & Units 09/08/2020 06/11/2020 01/23/2018  WBC 4.0 - 10.5 K/uL  7.3 9.1 8.3  Hemoglobin 12.0 - 15.0 g/dL 11.6(L) 11.5(L) 11.9(L)  Hematocrit 36.0 - 46.0 % 36.1 38.1 36.6  Platelets 150.0 - 400.0 K/uL 219.0 219 190.0    No results found for: VD25OH  Clinical ASCVD: Yes  The ASCVD Risk score Mikey Bussing DC Jr., et al., 2013) failed to calculate for the following reasons:   The 2013 ASCVD risk score is only valid for ages 40 to 44   The patient has a prior MI or stroke diagnosis    Other:  DEXA 03/05/2014             AP LUMBAR SPINE not used for calculation due to significant degenerative changes.             Left FOREARM (1/3 RADIUS) T Score: -0.4             Left FEMUR neck T-Score: -0.7     Social History   Tobacco Use  Smoking Status Former   Types: Cigarettes   Quit date: 04/24/2000   Years since quitting: 20.6  Smokeless Tobacco Never   BP Readings from Last 3 Encounters:  12/03/20 (!) 110/55  09/17/20 100/60  09/08/20 100/63   Pulse Readings from Last 3 Encounters:  12/03/20 87  09/17/20 87  09/08/20 65   Wt Readings from Last 3 Encounters:  12/03/20 188 lb (85.3 kg)  09/17/20 205 lb (93 kg)  09/08/20 202 lb 9.6 oz (91.9 kg)    Assessment: Review of patient past medical history, allergies, medications, health status, including review of consultants reports, laboratory and other test data, was performed as part of comprehensive evaluation and provision of chronic care management services.   SDOH:  (Social Determinants of Health) assessments and interventions performed:    CCM Care Plan  Allergies  Allergen Reactions   Ibuprofen Other (See Comments)    Pt has hx of peptic ulcer and diverticulitis.     Medications Reviewed Today     Reviewed by Cherre Robins, PharmD (Pharmacist) on 12/10/20 at 1451  Med List Status: <None>   Medication Order Taking? Sig Documenting Provider Last Dose Status Informant  albuterol (PROVENTIL) (2.5 MG/3ML) 0.083% nebulizer solution [id number] Yes USE 1 VIAL IN NEBULIZER EVERY 6 HOURS AS NEEDED  FOR WHEEZING AND FOR SHORTNESS OF [name], Lenna Sciara, NP Taking Active   allopurinol (ZYLOPRIM) 100 MG tablet [id number] Yes Take 2 tablets (200 mg total) by mouth daily. Debbrah Alar, NP Taking Active   aspirin 81 MG EC tablet [id number] Yes Take 1 tablet (81 mg total) by mouth daily. Swallow whole. Tobb, Godfrey Pick, DO Taking Active   atorvastatin (LIPITOR) 80 MG tablet [id number] Yes Take 1 tablet (80 mg total) by mouth daily. Berniece Salines, DO Taking Active   colchicine 0.6 MG tablet [id number] Yes Take 0.6 mg by mouth daily as needed (gout). [provider] Taking Active   diazepam (VALIUM) 10 MG tablet [id number] Yes  TAKE ONE TABLET BY MOUTH EVERY TWELVE HOURS AS NEEDED FOR ANXIETY Debbrah Alar, NP Taking Active   dicyclomine (BENTYL) 10 MG capsule [id number] Yes Take 1 capsule (10 mg total) by mouth in the morning, at noon, in the evening, and at bedtime. Debbrah Alar, NP Taking Active   escitalopram (LEXAPRO) 20 MG tablet [id number] Yes Take 1 tablet (20 mg total) by mouth daily. Debbrah Alar, NP Taking Active   ezetimibe (ZETIA) 10 MG tablet [id number] Yes Take 1 tablet (10 mg total) by mouth daily. Berniece Salines, DO Taking Active   famotidine (PEPCID) 20 MG tablet [id number] Yes Take 1 tablet (20 mg total) by mouth 2 (two) times daily. Cirigliano, Vito V, DO Taking Active   isosorbide mononitrate (IMDUR) 30 MG 24 hr tablet [id number] Yes Take 0.5 tablets (15 mg total) by mouth daily. Berniece Salines, DO Taking Active   levothyroxine (EUTHYROX) 25 MCG tablet [id number] Yes TAKE 1 TABLET BY MOUTH ONCE DAILY BEFORE Adaline Sill, NP Taking Active   lisinopril (ZESTRIL) 2.5 MG tablet [id number] Yes Take 1 tablet (2.5 mg total) by mouth daily. Tobb, Kardie, DO Taking Active   meclizine (ANTIVERT) 25 MG tablet [id number] Yes TAKE ONE TABLET BY MOUTH TWICE DAILY AS NEEDED FOR dizziness Debbrah Alar, NP Taking Active   methylPREDNISolone (MEDROL DOSEPAK)  4 MG TBPK tablet [id number] Yes Please take per package instructions. Debbrah Alar, NP Taking Active   metoprolol succinate (TOPROL-XL) 25 MG 24 hr tablet [id number] Yes Take 0.5 tablets (12.5 mg total) by mouth daily. Tobb, Kardie, DO Taking Active   nitroGLYCERIN (NITROSTAT) 0.4 MG SL tablet [id number] Yes Place 1 tablet (0.4 mg total) under the tongue every 5 (five) minutes as needed for chest pain. Tobb, Kardie, DO Taking Active   nystatin (NYSTATIN) powder [id number] Yes APPLY  POWDER TOPICALLY TO AFFECTED AREA 2 TIMES DAILY  Patient taking differently: APPLY  POWDER TOPICALLY TO AFFECTED AREA 2 TIMES DAILY   Debbrah Alar, NP Taking Active   pantoprazole (PROTONIX) 40 MG tablet [id number] Yes Take 1 tablet (40 mg total) by mouth 2 (two) times daily. Debbrah Alar, NP Taking Active   potassium chloride SA (KLOR-CON) 20 MEQ tablet [id number] Yes Take 1 tablet (20 mEq total) by mouth daily. Debbrah Alar, NP Taking Active   sucralfate (CARAFATE) 1 g tablet [id number] Yes TAKE ONE TABLET BY MOUTH FOUR TIMES DAILY. MAY cut pill in half AND DISSOLVE in water prior TO drinking Cirigliano, Dominic Pea, DO Taking Active             Patient Active Problem List   Diagnosis Date Noted   Back pain 12/03/2020   Acute cystitis without hematuria 12/03/2020   Grief reaction 12/03/2020   Fatty liver 12/01/2020   Other chest pain 09/17/2020   Coronary artery disease involving native coronary artery of native heart 60/60/0459   Metabolic syndrome 97/74/1423   Prediabetes 09/17/2020   Allergy 09/16/2020   Anxiety 09/16/2020   Hypertension 09/16/2020   Vertigo 09/16/2020   Dark stools 09/08/2020   Dysphagia    Esophageal stricture    Hiatal hernia    Hypothyroid 01/25/2018   Mild CAD 10/03/2017   Stress-induced cardiomyopathy 09/19/2017   Hypokalemia 09/14/2017   Non-ST elevation (NSTEMI) myocardial infarction [facility]) - due to Demand Infarction 09/14/2017   CKD (chronic kidney  disease) 04/04/2017   HLD (hyperlipidemia)    Allergic rhinitis    Gout 02/28/2017   Depression with anxiety 02/28/2017   Epigastric abdominal pain 12/22/2016   COPD  with chronic bronchitis ([place]) 12/22/2016   GERD (gastroesophageal reflux disease) 12/22/2016   DOE (dyspnea on exertion) 12/22/2016   Peptic ulcer 01/04/2003    Immunization History  Administered Date(s) Administered   Fluad Quad(high Dose 65+) 03/14/2020, 12/03/2020   Influenza, High Dose Seasonal PF 01/23/2018, 12/07/2018   Influenza-Unspecified 12/23/2018   PFIZER(Purple Top)SARS-COV-2 Vaccination 05/14/2019, 06/08/2019   Pneumococcal Conjugate-13 07/16/2015   Pneumococcal Polysaccharide-23 12/23/2016   Tdap 09/02/2009, 04/05/2017    Conditions to be addressed/monitored: CAD, HTN, HLD, COPD, Anxiety, Depression and gout, GERD ; hypothyroidism  Care Plan : General Pharmacy (Adult)  Updates made by Cherre Robins, PHARMD since 12/10/2020 12:00 AM     Problem: Medication management and Monitoring and Coordination of Care for Chronic Conditions   Priority: High  Onset Date: 09/02/2020     Long-Range Goal: Medication management   Start Date: 09/02/2020  Recent Progress: On track  Priority: High  Note:     Current Barriers:  Unable to achieve control of hyperlipidemia  Does not adhere to prescribed medication regimen Does not maintain contact with provider office  Pharmacist Clinical Goal(s):  Over the next 90 days, patient will achieve adherence to monitoring guidelines and medication adherence to achieve therapeutic efficacy achieve control of hyperlipidemia as evidenced by LDL < 70 adhere to prescribed medication regimen as evidenced by filling prescriptions on times contact provider office for questions/concerns as evidenced notation of same in electronic health record through collaboration with PharmD and provider.   Interventions: 1:1 collaboration with Debbrah Alar, NP regarding development and  update of comprehensive plan of care as evidenced by provider attestation and co-signature Inter-disciplinary care team collaboration (see longitudinal plan of care) Comprehensive medication review performed; medication list updated in electronic medical record  Hypertension: Controlled; BP goal is:  <130/80 Previous Office BP low at 95/52  Patient is not checking BP at home - states she does not have BP cuff.  Patient has failed these meds in the past: None noted  Current Regimen: Metoprolol Succinate 25mg  0.5 tab daily Lisinopril 2.5mg  daily Interventions:  Reviewed refill records and assessed for adherence issues. Discussed importance of taking metoprolol and lisinopril for heart health Reviewed signs and symptoms of low BP to monitor for.  Discussed BP goal Recommended get BP cuff and check BP 2 to 3 times per week.  Continue current medications    Hyperlipidemia / history of MI: Not controlled; LDL goal < 70  Patient has failed these meds in past: None noted  Statin has not been filled since 08/12/2020. Patient brought in bottles of medications and has 2 full bottles of atorvastatin 40mg  and a full bottle of atorvastatin 80mg .  Patient states she just stopped taking atorvastatin because she was taking so many other medications Current therapy Atorvastatin 80mg  daily (increased 08/18/2020) Ezetimibe 10mg  daily (added 09/17/2020)  Metoprolol Succinate 25mg  1/2 tab daily Aspirin 81mg  daily  Interventions:  Discussed LDL goal and recent LDL results Discussed importance of taking atorvastatin daily to both lower cholesterol and prevent ASCVD / recurrent MI - patient states now that she knows the benefits she will take her cholesterol medications  Chronic Obstructive Pulmonary Disease: Controlled Current treatment: Albuterol solution for nebulizer - use in nebulizer up to every 6 hours as needed for shortness of breath No exacerbations requiring treatment in the last 6 months   Interventions: None  Depression/Anxiety:  Controlled Patient reports she is feeling so much better since back pain has improved. It has also helped with her mood. Patient  has failed these meds in past: citalopram (ineffective) Current treatment: Escitalopram 20mg  daily Diazepam 10mg  every 12 hours as needed  Interventions:  Discussed adherence and importance of taking escitalopram for depression / anxiety Continue current therapy  Hypothyroidism:  TSH was WNL at last check on 08/18/2020 Patient has failed these meds in past: None noted  Patient is currently controlled on the following medications:  Euthyrox 64mcg daily  Intervention: (addressed at previous visits)  Continue current therapy   Gout:   Goal Uric Acid < 6 mg/dL   Last uric acid was not at goal (UA = 8.4 on 03/14/2020) but patient denies symptoms of gout at this time.  Current medications:  Allopurinol 100mg  - take 2 tablets = 200mg  daily Colchicine 0.6mg  up to bid as needed for acute gout Medications that may increase uric acid levels: Aspirin Last gout flare: Feb 2022 Last allopurinol refill was 08/12/2020 for 90 DS Patient has failed these meds in past: None noted   Interventions:  Reviewed preventative versus treatment medications for gout.  Continue current therapy Consider checking Uric acid with next labs.    Medication management:   Patient is using scissors to cut her pills.  Current pharmacy:  [facility]  [facility] Intervention:  Recommended she get pill splitter/cutter to make cutting tablets easier. Asked [facility] to order and send with last delivery order but patient states a pill cutter was not included in her order. Patient states she will get her daughter to pick up a pill cutter.  Verified with patient that she received a deliver for 30 days supply of medications requested at our last visit: aspirin 81mg , diazepam, escitalopram, ezetimibe, levothyroxine, meclizine, metoprolol  succinate, pantoprazole, and sucralfate. Will be filled at [facility] and delivered to patient  Reviewed each medication and reason for taking. Noted reason for each medication on patient's medication list.  Contacted patient's daughter about assisting with medication weekly boxes to improve adherence. (Completed at last visit)    Patient Goals/Self-Care Activities Over the next 90 days, patient will:  take medications as prescribed, focus on medication adherence by filing prescriptions at one pharmacy, and call if any issues with medications - especially with splitting tablets or swallowing tablets.   Follow Up Plan:  1 - 2 weeks to review medication adherence.                      Medication Assistance: None required.  Patient affirms current coverage meets needs.  Patient's preferred pharmacy is:  [place], [place] [address] Phone: [phone] Fax: [phone]  [facility] - [place], [place] - [address] [place] [address] Phone: [phone] Fax: [phone]  Follow Up:  Patient agrees to Care Plan and Follow-up.  Plan: phone follow up in 1-2 weeks to review medication adherence and assist in reordering medications.   Cherre Robins, PharmD Clinical Pharmacist [facility] [phone]

## 2020-12-11 ENCOUNTER — Ambulatory Visit: Payer: Medicare HMO | Admitting: Cardiology

## 2020-12-16 ENCOUNTER — Other Ambulatory Visit: Payer: Self-pay | Admitting: *Deleted

## 2020-12-16 NOTE — Patient Outreach (Signed)
[facility]) Care Management  [facility] Care Manager  12/16/2020   Veronica LEMBERGER 12/26/1940 [id number]  Subjective: Successful telephone outreach call to patient. HIPAA identifiers obtained. Patient states she is grieving over her brother's recent death. Nurse offered her sincere condolences and discussed grief counseling with the patient who declines at this time stating that she has support from her daughter and family. Nurse discussed with patient her health goals and her health and wellness needs which were documented in the Epic system. Patient did not have any further questions or concerns today and did confirm that she has this nurse's contact number to call her if needed.   Encounter Medications:  Outpatient Encounter Medications as of 12/16/2020  Medication Sig   albuterol (PROVENTIL) (2.5 MG/3ML) 0.083% nebulizer solution USE 1 VIAL IN NEBULIZER EVERY 6 HOURS AS NEEDED FOR WHEEZING AND FOR SHORTNESS OF BREATH   allopurinol (ZYLOPRIM) 100 MG tablet Take 2 tablets (200 mg total) by mouth daily.   aspirin 81 MG EC tablet Take 1 tablet (81 mg total) by mouth daily. Swallow whole.   atorvastatin (LIPITOR) 80 MG tablet Take 1 tablet (80 mg total) by mouth daily.   colchicine 0.6 MG tablet Take 0.6 mg by mouth daily as needed (gout).   diazepam (VALIUM) 10 MG tablet TAKE ONE TABLET BY MOUTH EVERY TWELVE HOURS AS NEEDED FOR ANXIETY   dicyclomine (BENTYL) 10 MG capsule Take 1 capsule (10 mg total) by mouth in the morning, at noon, in the evening, and at bedtime.   escitalopram (LEXAPRO) 20 MG tablet Take 1 tablet (20 mg total) by mouth daily.   ezetimibe (ZETIA) 10 MG tablet Take 1 tablet (10 mg total) by mouth daily.   famotidine (PEPCID) 20 MG tablet Take 1 tablet (20 mg total) by mouth 2 (two) times daily.   isosorbide mononitrate (IMDUR) 30 MG 24 hr tablet Take 0.5 tablets (15 mg total) by mouth daily.   levothyroxine (EUTHYROX) 25 MCG tablet TAKE 1 TABLET BY MOUTH ONCE  DAILY BEFORE BREAKFAST   lisinopril (ZESTRIL) 2.5 MG tablet Take 1 tablet (2.5 mg total) by mouth daily.   meclizine (ANTIVERT) 25 MG tablet TAKE ONE TABLET BY MOUTH TWICE DAILY AS NEEDED FOR dizziness   methylPREDNISolone (MEDROL DOSEPAK) 4 MG TBPK tablet Please take per package instructions.   metoprolol succinate (TOPROL-XL) 25 MG 24 hr tablet Take 0.5 tablets (12.5 mg total) by mouth daily.   nitroGLYCERIN (NITROSTAT) 0.4 MG SL tablet Place 1 tablet (0.4 mg total) under the tongue every 5 (five) minutes as needed for chest pain.   nystatin (NYSTATIN) powder APPLY  POWDER TOPICALLY TO AFFECTED AREA 2 TIMES DAILY (Patient taking differently: APPLY  POWDER TOPICALLY TO AFFECTED AREA 2 TIMES DAILY)   pantoprazole (PROTONIX) 40 MG tablet Take 1 tablet (40 mg total) by mouth 2 (two) times daily.   potassium chloride SA (KLOR-CON) 20 MEQ tablet Take 1 tablet (20 mEq total) by mouth daily.   sucralfate (CARAFATE) 1 g tablet TAKE ONE TABLET BY MOUTH FOUR TIMES DAILY. MAY cut pill in half AND DISSOLVE in water prior TO drinking   No facility-administered encounter medications on file as of 12/16/2020.    Functional Status:  In your present state of health, do you have any difficulty performing the following activities: 12/03/2020  Hearing? N  Vision? N  Difficulty concentrating or making decisions? N  Walking or climbing stairs? N  Dressing or bathing? N  Doing errands, shopping? N  Some recent data  might be hidden    Fall/Depression Screening: Fall Risk  12/16/2020 08/20/2020 08/18/2020  Falls in the past year? 0 0 0  Number falls in past yr: 0 0 0  Injury with Fall? 0 0 0  Risk for fall due to : Impaired balance/gait;Impaired mobility Impaired mobility;Impaired balance/gait -  Follow up Falls prevention discussed;Education provided;Falls evaluation completed Falls prevention discussed;Education provided;Falls evaluation completed -   PHQ 2/9 Scores 12/03/2020 08/18/2020 07/31/2020 10/09/2019  12/26/2017 10/12/2017 09/26/2017  PHQ - 2 Score 3 4 1  0 3 0 1  PHQ- 9 Score 8 10 - - 11 - -    Assessment:   Care Plan There are no care plans that you recently modified to display for this patient.    Goals Addressed             This Visit's Progress    [facility]) Obtain Eye Exam   Not on track    Timeframe:  Long-Range Goal Priority:  Medium Start Date:  08/20/20                           Expected End Date: 12/02/20                     Follow Up Date 04/03/21    - schedule appointment with eye doctor    Why is this important?   Eye check-ups are important when you have diabetes.  Vision loss can be prevented.    Notes: 12/16/20: Patient explains that she has not made an eye appointment and requested that this nurse send her the [facility] contact information again for her to call and make an eye appointment. [facility]  Phone:[phone]  [address] [place], [place].  08/20/20: Patient reports that it has been several years since her last eye exam. Nurse called [facility] as a 3 way call with the patient to ensure that they do take [facility]. Nurse provided the patient with their office number and address and the patient states she will call then to make an appointment.  [facility] [address] [place], [place].     [facility]) Patient will verbalize going to the [facility] to increase her socialization within the next 90 days       Timeframe:  Long-Range Goal Priority:  Medium Start Date: 12/16/20                             Expected End Date: 04/03/21 Follow Up Date: 04/03/21  -Discussed going to [facility] in [place]   Notes 12/16/20: Patient expressed desire to participate in the classes at the senior center. Nurse discussed with the patient asking her grandsons to drive her and pick her up from the  center.                     12/18/20 [facility] [address] [place], [place]  Contact [phone] [address] [phone]     [facility]) Patient will verbalize having transportation to her providers appointment within the next 90 days       Timeframe:  Long-Range Goal Priority:  Medium Start Date: 12/16/20  Expected End Date: 04/03/21 Follow Up Dated 04/03/21  -Discussed [facility] -Discussed [facility]  Notes 12/16/20: Patient explained that her car needs to be repaired and she needs transportation services. Nurse will send a intake request form to [employer] and will send patient information to contact [facility] or [facility] for her to get set up with their transportation services.                          [facility]) Track and Manage My Symptoms   On track    Timeframe:  Long-Range Goal Priority:  High Start Date: 01/04/20                            Expected End Date: 02/01/21                      Follow Up Date 04/03/21   - develop a rescue plan - eliminate symptom triggers at home - follow rescue plan if symptoms flare-up - keep follow-up appointments  -Discussed rescue plans and color zones for COPD  Why is this important?   Tracking your symptoms and other information about your health helps your doctor plan your care.  Write down the symptoms, the time of day, what you were doing and what medicine you are taking.  You will soon learn how to manage your symptoms.     Notes: Updated 12/16/20: Patient reports that her COPD is well controlled presently. Nurse discussed COPD color zones and rescue action plans with the patient and will send the patient COPD education packet which includes this information.  Updated 07/29/20: Patient reports that her COPD is currently well controlled.  Updated 08/20/20: Patient reports that she did not experience any  breathing difficulties after her EGD procedure. Continues to report that her COPD is under control.     ([facility])Track and Manage My Triggers   On track    Timeframe:  Long-Range Goal Priority:  High Start Date: 01/04/20                            Expected End Date: 02/01/21                      Follow Up Date: 04/03/21   - identify and remove indoor air pollutants - limit outdoor activity during cold weather    Why is this important?   Triggers are activities or things, like tobacco smoke or cold weather, that make your COPD (chronic obstructive pulmonary disease) flare-up.  Knowing these triggers helps you plan how to stay away from them.  When you cannot remove them, you can learn how to manage them.     Notes: 12/16/20: Patient states that her home environment is safe from triggers. Patient reports that her COPD has been well controlled and that she has not needed to use her nebulizer recently.   Updated 08/20/20: Patient reports no breathing difficulties after her EGD procedure.   Updated 07/31/20: Patient states that her home environment is safe and that currently her COPD is well controlled.       Plan: RN Health Coach will send PCP today's assessment note, will send patient transportation resources, advance directive documents, COPD education, senior center resources, ophthalmologist resource to make an eye appointment, and  Ensure coupons. Nurse will  call patient within the month of December. Follow-up: Patient agrees to Care Plan and Follow-up.  Blanchie Serve RN, BSN [facility] Care Management  RN Health Coach [phone] [email or web address]

## 2020-12-16 NOTE — Patient Instructions (Addendum)
Goals Addressed             This Visit's Progress    [facility]) Obtain Eye Exam   Not on track    Timeframe:  Long-Range Goal Priority:  Medium Start Date:  08/20/20                           Expected End Date: 12/02/20                     Follow Up Date 04/03/21    - schedule appointment with eye doctor    Why is this important?   Eye check-ups are important when you have diabetes.  Vision loss can be prevented.    Notes: 12/16/20: Patient explains that she has not made an eye appointment and requested that this nurse send her the [facility] contact information again for her to call and make an eye appointment. [facility]  [phone]  [address] [place], [place] [address].  08/20/20: Patient reports that it has been several years since her last eye exam. Nurse [facility] as a 3 way call with the patient to ensure that they do take [facility]. Nurse provided the patient with their office number and address and the patient states she will call then to make an appointment.  [phone]/ [address] [place], [place] [address].     [facility]) Patient will verbalize going to the [facility] to increase her socialization within the next 90 days       Timeframe:  Long-Range Goal Priority:  Medium Start Date: 12/16/20                             Expected End Date: 04/03/21 Follow Up Date: 04/03/21  -Discussed going to [facility] in [place]   Notes 12/16/20: Patient expressed desire to participate in the classes at the senior center. Nurse discussed with the patient asking her grandsons to drive her and pick her up from the center.                     [facility] [address] [place], [place] [address]  Contact Lorene Dy H[address]Shine [phone]     [facility]) Patient will verbalize having  transportation to her providers appointment within the next 90 days       Timeframe:  Long-Range Goal Priority:  Medium Start Date: 12/16/20                           Expected End Date: 04/03/21 Follow Up Dated 04/03/21  -Discussed [facility] -Discussed [facility] [place]  Notes 12/16/20: Patient explained that her car needs to be repaired and she needs transportation services. Nurse will send a intake request form to [employer] and will send patient information to contact [facility] or [facility] for her to get set up with their transportation services.                          [facility]) Track and Manage My Symptoms   On track    Timeframe:  Long-Range Goal Priority:  High Start Date: 01/04/20  Expected End Date: 02/01/21                      Follow Up Date 04/03/21   - develop a rescue plan - eliminate symptom triggers at home - follow rescue plan if symptoms flare-up - keep follow-up appointments  -Discussed rescue plans and color zones for COPD  Why is this important?   Tracking your symptoms and other information about your health helps your doctor plan your care.  Write down the symptoms, the time of day, what you were doing and what medicine you are taking.  You will soon learn how to manage your symptoms.     Notes: Updated 12/16/20: Patient reports that her COPD is well controlled presently. Nurse discussed COPD color zones and rescue action plans with the patient and will send the patient COPD education packet which includes this information.  Updated 07/29/20: Patient reports that her COPD is currently well controlled.  Updated 08/20/20: Patient reports that she did not experience any breathing difficulties after her EGD procedure. Continues to report that her COPD is under control.     ([facility])Track and Manage My Triggers   On track    Timeframe:  Long-Range Goal Priority:  High Start  Date: 01/04/20                            Expected End Date: 02/01/21                      Follow Up Date: 04/03/21   - identify and remove indoor air pollutants - limit outdoor activity during cold weather    Why is this important?   Triggers are activities or things, like tobacco smoke or cold weather, that make your COPD (chronic obstructive pulmonary disease) flare-up.  Knowing these triggers helps you plan how to stay away from them.  When you cannot remove them, you can learn how to manage them.     Notes: 12/16/20: Patient states that her home environment is safe from triggers. Patient reports that her COPD has been well controlled and that she has not needed to use her nebulizer recently.   Updated 08/20/20: Patient reports no breathing difficulties after her EGD procedure.   Updated 07/31/20: Patient states that her home environment is safe and that currently her COPD is well controlled.

## 2020-12-18 ENCOUNTER — Ambulatory Visit: Payer: Medicare HMO | Admitting: Cardiology

## 2020-12-29 ENCOUNTER — Ambulatory Visit: Payer: Medicare HMO

## 2020-12-29 ENCOUNTER — Ambulatory Visit: Payer: Medicare HMO | Admitting: Pharmacist

## 2020-12-29 DIAGNOSIS — R413 Other amnesia: Secondary | ICD-10-CM

## 2020-12-29 DIAGNOSIS — Z79899 Other long term (current) drug therapy: Secondary | ICD-10-CM

## 2020-12-29 DIAGNOSIS — E039 Hypothyroidism, unspecified: Secondary | ICD-10-CM

## 2020-12-29 NOTE — Patient Instructions (Signed)
Visit Information  PATIENT GOALS:  Goals Addressed             This Visit's Progress    Chronic Care Management Pharmacy Care Plan   On track    CARE PLAN ENTRY (see longitudinal plan of care for additional care plan information)  Current Barriers:  Chronic Disease Management support, education, and care coordination needs related to Hyperlipidemia/Hx of MI, Pre-Diabetes, Hypothyroidism, Depression/Anxiety, GERD, Gout    Hypertension Screening BP Readings from Last 3 Encounters:  12/03/20 (!) 110/55  09/17/20 100/60  09/08/20 100/63  Pharmacist Clinical Goal(s): Over the next 90 days, patient will work with PharmD and providers to maintain BP goal <130/80 Current regimen:  Metoprolol Succinate 25mg  0.5 tab daily Lisinopril 2.5mg  daily Interventions: Discussed blood pressure goal Consider purchasing blood pressure cuff for home use and check blood pressure 2 to 3 times per week and record Reviewed refill records and assessed for adherence issues. Discussed importance of taking metoprolol and lisinopril for heart health Patient self care activities - Over the next 90 days, patient will: Maintain blood pressure less than 130/80 Consider purchasing blood pressure cuff and check blood pressure 2 to 3 times per week and record Continue current medications    Hyperlipidemia / Heart health Lab Results  Component Value Date/Time   LDLCALC 125 (H) 08/18/2020 12:03 PM   LDLCALC 108 (H) 03/14/2020 02:39 PM  Pharmacist Clinical Goal(s): Over the next 90 days, patient will work with PharmD and providers to achieve LDL goal < 70 Current regimen:  Atorvastatin 80mg  daily  Ezetimibe 10mg  daily Aspirin 81mg  daily  Metoprolol succinate ER 25mg  - take 0.5 tablet daily Interventions: Discussed LDL goal and importance of medication adherence Repeat LDL at next office visit since ezetimibe started Discussed adherence Patient self care activities - Over the next 90 days, patient  will: Continue medications current prescribed to lower cholesterol and to prevent heart attack  Anxiety / Depression:  Pharmacist Clinical Goal(s): Over the next 45 days, patient will work with PharmD and providers to maintain control of anxiety and depression symptoms Current regimen:  Escitalopram 20mg  daily  Diazepam 10mg  take 1 tablet every 12 hours if needed for anxiety Interventions: Addressed at previous visit Patient self care activities - Over the next 90 days, patient will: Maintain current medication regimen for anxiety and depression   Gout:  Pharmacist Clinical Goal(s): Over the next 90 days, patient will work with PharmD and providers to prevent acute gout episodes / lower uric acid Current regimen:  Allopurinol 100mg  - take 2 tablets = 200mg  daily (prevention) Colchicine 0.6mg  take up to twice a day as needed for gout attack Interventions: Discussed difference between maintenance / presentation medication and treatment Patient self care activities - Over the next 90 days, patient will: Maintain current medication regimen for gout  Pre-Diabetes Lab Results  Component Value Date/Time   HGBA1C 6.2 (H) 09/17/2020 10:38 AM   HGBA1C 6.1 06/12/2018 01:58 PM  Pharmacist Clinical Goal(s): Over the next 90 days, patient will work with PharmD and providers to maintain A1c goal <6.5% Current regimen:  Diet and exercise management   Interventions: Discussed the importance of limiting carbohydrates (30-45 grams per meal) Patient self care activities - Over the next 90 days, patient will: Maintain a1c <6.5%  Hypothyroidism Pharmacist Clinical Goal(s) Over the next 90 days, patient will work with PharmD and providers to maintain TSH within normal limits and reduce risk of symptoms associated with hypothyroidism Current regimen:  Euthyrox / levothyroxine  daily Interventions: Discussed importance of medication adherence Reminded to separate Euthyrox / levothyroxine  dose by 30 minutes from breakfast and morning medications Patient self care activities - Over the next 90 days, patient will: Maintain hypothyroidism medication regimen  Medication management Pharmacist Clinical Goal(s): Over the next 90 days, patient will work with PharmD and providers to achieve optimal medication adherence Current pharmacy: [facility] to [facility] Interventions Comprehensive medication review performed. Utilize [facility] pharmacy for medication synchronization, packaging and delivery Tried to assist patient with requesting refills but she thinks she might have "messed up". Will scheduled face to face home visit to review meds and help patient fill pill boxes and request refills.  Reviewed each medication and reason for taking. Noted reason for each medication on patient's medication list. (Done at previous visit) Contacted patient's daughter about assisting with medication weekly boxes to improve adherence (done at previous visit)  Patient self care activities - Over the next 90 days, patient will: Focus on medication adherence by filling and taking medications appropriately  Take medications as prescribed Purchase a pill cutter at the pharmacy.  Report any questions or concerns to PharmD and/or provider(s) Contact Sorren Vallier, PharmD if any issues with cost of medications or difficulty swallowing tablets.   Please see past updates related to this goal by clicking on the "Past Updates" button in the selected goal          The patient verbalized understanding of instructions, educational materials, and care plan provided today and declined offer to receive copy of patient instructions, educational materials, and care plan.   Face to Face appointment with care management team member scheduled for:  12/31/2020 - home visit

## 2020-12-29 NOTE — Chronic Care Management (AMB) (Signed)
Chronic Care Management Pharmacy Note  12/29/2020 Name:  Veronica Wolf MRN:  [id number] DOB:  1940-05-19  Summary: Patient is unsure if she is taking her medications correctly. She is concerned that she has "Messed up" her pill boxes  Recommendations/Changes made from today's visit: Made appt for face to face home visit (patient's car is currently broken down)  Subjective: Veronica Wolf is an 80 y.o. year old female who is a primary patient of Debbrah Alar, NP.  The CCM team was consulted for assistance with disease management and care coordination needs.    Engaged with patient by telephone for follow up visit in response to provider referral for pharmacy case management and/or care coordination services.   Consent to Services:  The patient was given information about Chronic Care Management services, agreed to services, and gave verbal consent prior to initiation of services.  Please see initial visit note for detailed documentation.   Patient Care Team: Debbrah Alar, NP as PCP - General (Internal Medicine) Revankar, Reita Cliche, MD as PCP - Cardiology (Cardiology) Michiel Cowboy, RN as [facility] Cherre Robins, PharmD (Pharmacist)  Recent office visits: 12/03/2020- PCP Inda Castle, NP) Seen for back pain. Prescribed Medrol dose pack and ordered Xay. Also checked urinalysis and recommended she complete course of ciprofloxacin given while she was in [place].  09/16/2020 - PCP Phone Call. Ultrasound showed fatty liver. Recommended follow up with GI.  09/08/2020 - PCP Inda Castle) Seen for abdominal pain and diarrhea. Hemoccult negative. Reordered Abdominal CT, LFTs and lipase, CBC. No medication changes 08/18/2020 - PCP Inda Castle) - follow up chronic conditions. Atorvastatin increased to $RemoveBefo'80mg'jAOjbsrHfEf$  daily after LDL checked and found to be 125; Also added aspirin $RemoveBefor'81mg'vWEfxlbJTrvY$  daily due to h/o MI.  Recent consult visits: 09/17/2020 - Cardio (Dr Harriet Masson) CAD /  intermittant chest pain. Started low dose Imdue $RemoveBe'15mg'lZNWSkvhb$  daily and ezetimibe $RemoveBefor'10mg'bwlCkMOOLqSO$  daily. Ordered nucular stress test  GI - see below / EGD done at [facility] visits: 08/13/2020 - EGD at [place]   Objective:  Lab Results  Component Value Date   CREATININE 1.38 (H) 08/18/2020   CREATININE 1.23 (H) 06/11/2020   CREATININE 1.45 (H) 03/14/2020    Lab Results  Component Value Date   HGBA1C 6.2 (H) 09/17/2020   Last diabetic Eye exam: No results found for: HMDIABEYEEXA  Last diabetic Foot exam: No results found for: HMDIABFOOTEX      Component Value Date/Time   CHOL 204 (H) 08/18/2020 1203   TRIG 182.0 (H) 08/18/2020 1203   HDL 42.30 08/18/2020 1203   CHOLHDL 5 08/18/2020 1203   VLDL 36.4 08/18/2020 1203   LDLCALC 125 (H) 08/18/2020 1203   LDLCALC 108 (H) 03/14/2020 1439    Hepatic Function Latest Ref Rng & Units 09/08/2020 08/18/2020 06/11/2020  Total Protein 6.0 - 8.3 g/dL 6.3 6.6 6.7  Albumin 3.5 - 5.2 g/dL 3.6 3.8 3.4(L)  AST 0 - 37 U/L $Remo'10 12 15  'ltcOL$ ALT 0 - 35 U/L $Remo'5 7 9  'LZDdK$ Alk Phosphatase 39 - 117 U/L 64 80 64  Total Bilirubin 0.2 - 1.2 mg/dL 0.4 0.2 0.3  Bilirubin, Direct 0.0 - 0.3 mg/dL 0.1 - -    Lab Results  Component Value Date/Time   TSH 3.57 08/18/2020 12:03 PM   TSH 1.77 03/14/2020 02:39 PM   FREET4 0.85 01/24/2018 03:04 PM    CBC Latest Ref Rng & Units 09/08/2020 06/11/2020 01/23/2018  WBC 4.0 -  10.5 K/uL 7.3 9.1 8.3  Hemoglobin 12.0 - 15.0 g/dL 11.6(L) 11.5(L) 11.9(L)  Hematocrit 36.0 - 46.0 % 36.1 38.1 36.6  Platelets 150.0 - 400.0 K/uL 219.0 219 190.0    No results found for: VD25OH  Clinical ASCVD: Yes  The ASCVD Risk score (Arnett DK, et al., 2019) failed to calculate for the following reasons:   The 2019 ASCVD risk score is only valid for ages 7 to 43   The patient has a prior MI or stroke diagnosis    Other:  DEXA 03/05/2014             AP LUMBAR SPINE not used for calculation due to significant degenerative  changes.             Left FOREARM (1/3 RADIUS) T Score: -0.4             Left FEMUR neck T-Score: -0.7     Social History   Tobacco Use  Smoking Status Former   Types: Cigarettes   Quit date: 04/24/2000   Years since quitting: 20.6  Smokeless Tobacco Never   BP Readings from Last 3 Encounters:  12/03/20 (!) 110/55  09/17/20 100/60  09/08/20 100/63   Pulse Readings from Last 3 Encounters:  12/03/20 87  09/17/20 87  09/08/20 65   Wt Readings from Last 3 Encounters:  12/03/20 188 lb (85.3 kg)  09/17/20 205 lb (93 kg)  09/08/20 202 lb 9.6 oz (91.9 kg)    Assessment: Review of patient past medical history, allergies, medications, health status, including review of consultants reports, laboratory and other test data, was performed as part of comprehensive evaluation and provision of chronic care management services.   SDOH:  (Social Determinants of Health) assessments and interventions performed:    CCM Care Plan  Allergies  Allergen Reactions   Ibuprofen Other (See Comments)    Pt has hx of peptic ulcer and diverticulitis.     Medications Reviewed Today     Reviewed by Michiel Cowboy, RN (Registered Nurse) on 12/16/20 at 1337  Med List Status: <None>   Medication Order Taking? Sig Documenting Provider Last Dose Status Informant  albuterol (PROVENTIL) (2.5 MG/3ML) 0.083% nebulizer solution [id number] Yes USE 1 VIAL IN NEBULIZER EVERY 6 HOURS AS NEEDED FOR WHEEZING AND FOR SHORTNESS OF [name], Lenna Sciara, NP Taking Active   allopurinol (ZYLOPRIM) 100 MG tablet [id number] Yes Take 2 tablets (200 mg total) by mouth daily. Debbrah Alar, NP Taking Active   aspirin 81 MG EC tablet [id number] Yes Take 1 tablet (81 mg total) by mouth daily. Swallow whole. Tobb, Godfrey Pick, DO Taking Active   atorvastatin (LIPITOR) 80 MG tablet [id number] Yes Take 1 tablet (80 mg total) by mouth daily. Berniece Salines, DO Taking Active   colchicine 0.6 MG tablet [id number] Yes Take 0.6 mg by  mouth daily as needed (gout). [provider] Taking Active   diazepam (VALIUM) 10 MG tablet [id number] Yes TAKE ONE TABLET BY MOUTH EVERY TWELVE HOURS AS NEEDED FOR ANXIETY Debbrah Alar, NP Taking Active   dicyclomine (BENTYL) 10 MG capsule [id number] Yes Take 1 capsule (10 mg total) by mouth in the morning, at noon, in the evening, and at bedtime. Debbrah Alar, NP Taking Active   escitalopram (LEXAPRO) 20 MG tablet [id number] Yes Take 1 tablet (20 mg total) by mouth daily. Debbrah Alar, NP Taking Active   ezetimibe (ZETIA) 10 MG tablet [id number] Yes Take 1 tablet (10 mg total) by mouth daily. Berniece Salines, DO Taking Active  famotidine (PEPCID) 20 MG tablet [id number] Yes Take 1 tablet (20 mg total) by mouth 2 (two) times daily. Cirigliano, Vito V, DO Taking Active   isosorbide mononitrate (IMDUR) 30 MG 24 hr tablet [id number] Yes Take 0.5 tablets (15 mg total) by mouth daily. Berniece Salines, DO Taking Active   levothyroxine (EUTHYROX) 25 MCG tablet [id number] Yes TAKE 1 TABLET BY MOUTH ONCE DAILY BEFORE Adaline Sill, NP Taking Active   lisinopril (ZESTRIL) 2.5 MG tablet [id number] Yes Take 1 tablet (2.5 mg total) by mouth daily. Tobb, Kardie, DO Taking Active   meclizine (ANTIVERT) 25 MG tablet [id number] Yes TAKE ONE TABLET BY MOUTH TWICE DAILY AS NEEDED FOR dizziness Debbrah Alar, NP Taking Active   methylPREDNISolone (MEDROL DOSEPAK) 4 MG TBPK tablet [id number] Yes Please take per package instructions. Debbrah Alar, NP Taking Active   metoprolol succinate (TOPROL-XL) 25 MG 24 hr tablet [id number] Yes Take 0.5 tablets (12.5 mg total) by mouth daily. Tobb, Kardie, DO Taking Active   nitroGLYCERIN (NITROSTAT) 0.4 MG SL tablet [id number] Yes Place 1 tablet (0.4 mg total) under the tongue every 5 (five) minutes as needed for chest pain. Tobb, Kardie, DO Taking Active   nystatin (NYSTATIN) powder [id number] Yes APPLY  POWDER TOPICALLY TO AFFECTED AREA 2  TIMES DAILY  Patient taking differently: APPLY  POWDER TOPICALLY TO AFFECTED AREA 2 TIMES DAILY   Debbrah Alar, NP Taking Active   pantoprazole (PROTONIX) 40 MG tablet [id number] Yes Take 1 tablet (40 mg total) by mouth 2 (two) times daily. Debbrah Alar, NP Taking Active   potassium chloride SA (KLOR-CON) 20 MEQ tablet [id number] Yes Take 1 tablet (20 mEq total) by mouth daily. Debbrah Alar, NP Taking Active   sucralfate (CARAFATE) 1 g tablet [id number] Yes TAKE ONE TABLET BY MOUTH FOUR TIMES DAILY. MAY cut pill in half AND DISSOLVE in water prior TO drinking Cirigliano, Dominic Pea, DO Taking Active             Patient Active Problem List   Diagnosis Date Noted   Back pain 12/03/2020   Acute cystitis without hematuria 12/03/2020   Grief reaction 12/03/2020   Fatty liver 12/01/2020   Other chest pain 09/17/2020   Coronary artery disease involving native coronary artery of native heart 70/35/0093   Metabolic syndrome 81/82/9937   Prediabetes 09/17/2020   Allergy 09/16/2020   Anxiety 09/16/2020   Hypertension 09/16/2020   Vertigo 09/16/2020   Dark stools 09/08/2020   Dysphagia    Esophageal stricture    Hiatal hernia    Hypothyroid 01/25/2018   Mild CAD 10/03/2017   Stress-induced cardiomyopathy 09/19/2017   Hypokalemia 09/14/2017   Non-ST elevation (NSTEMI) myocardial infarction [facility]) - due to Demand Infarction 09/14/2017   CKD (chronic kidney disease) 04/04/2017   HLD (hyperlipidemia)    Allergic rhinitis    Gout 02/28/2017   Depression with anxiety 02/28/2017   Epigastric abdominal pain 12/22/2016   COPD with chronic bronchitis ([place]) 12/22/2016   GERD (gastroesophageal reflux disease) 12/22/2016   DOE (dyspnea on exertion) 12/22/2016   Peptic ulcer 01/04/2003    Immunization History  Administered Date(s) Administered   Fluad Quad(high Dose 65+) 03/14/2020, 12/03/2020   Influenza, High Dose Seasonal PF 01/23/2018, 12/07/2018   Influenza-Unspecified  12/23/2018   PFIZER(Purple Top)SARS-COV-2 Vaccination 05/14/2019, 06/08/2019   Pneumococcal Conjugate-13 07/16/2015   Pneumococcal Polysaccharide-23 12/23/2016   Tdap 09/02/2009, 04/05/2017    Conditions to be addressed/monitored: CAD, HTN, HLD, COPD, Anxiety, Depression and gout, GERD ; hypothyroidism  Care Plan :  General Pharmacy (Adult)  Updates made by Cherre Robins, PHARMD since 12/29/2020 12:00 AM     Problem: Medication management and Monitoring and Coordination of Care for Chronic Conditions   Priority: High  Onset Date: 09/02/2020     Long-Range Goal: Medication management   Start Date: 09/02/2020  Recent Progress: On track  Priority: High  Note:     Current Barriers:  Unable to achieve control of hyperlipidemia  Does not adhere to prescribed medication regimen Does not maintain contact with provider office  Pharmacist Clinical Goal(s):  Over the next 90 days, patient will achieve adherence to monitoring guidelines and medication adherence to achieve therapeutic efficacy achieve control of hyperlipidemia as evidenced by LDL < 70 adhere to prescribed medication regimen as evidenced by filling prescriptions on times contact provider office for questions/concerns as evidenced notation of same in electronic health record through collaboration with PharmD and provider.   Interventions: 1:1 collaboration with Debbrah Alar, NP regarding development and update of comprehensive plan of care as evidenced by provider attestation and co-signature Inter-disciplinary care team collaboration (see longitudinal plan of care) Comprehensive medication review performed; medication list updated in electronic medical record  Hypertension: Controlled; BP goal is:  <130/80 Previous Office BP low at 95/52  Patient is not checking BP at home - states she does not have BP cuff.  Patient has failed these meds in the past: None noted  Current Regimen: Metoprolol Succinate 25mg  0.5 tab  daily Lisinopril 2.5mg  daily Interventions:  Reviewed refill records and assessed for adherence issues. Discussed importance of taking metoprolol and lisinopril for heart health Reviewed signs and symptoms of low BP to monitor for.  Discussed BP goal Recommended get BP cuff and check BP 2 to 3 times per week.  Continue current medications    Hyperlipidemia / history of MI: Not controlled; LDL goal < 70  Patient has failed these meds in past: None noted  Statin has not been filled since 08/12/2020. Patient brought in bottles of medications and has 2 full bottles of atorvastatin 40mg  and a full bottle of atorvastatin 80mg .  Patient states she just stopped taking atorvastatin because she was taking so many other medications Current therapy Atorvastatin 80mg  daily (increased 08/18/2020) Ezetimibe 10mg  daily (added 09/17/2020)  Metoprolol Succinate 25mg  1/2 tab daily Aspirin 81mg  daily  Interventions:  Discussed LDL goal and recent LDL results Discussed importance of taking atorvastatin daily to both lower cholesterol and prevent ASCVD / recurrent MI - patient states now that she knows the benefits she will take her cholesterol medications  Chronic Obstructive Pulmonary Disease: Controlled Current treatment: Albuterol solution for nebulizer - use in nebulizer up to every 6 hours as needed for shortness of breath No exacerbations requiring treatment in the last 6 months  Interventions: None  Depression/Anxiety:  Controlled Patient reports she is feeling so much better since back pain has improved. It has also helped with her mood. Patient has failed these meds in past: citalopram (ineffective) Current treatment: Escitalopram 20mg  daily Diazepam 10mg  every 12 hours as needed  Interventions:  Discussed adherence and importance of taking escitalopram for depression / anxiety Continue current therapy  Hypothyroidism:  TSH was WNL at last check on 08/18/2020 Patient has failed these  meds in past: None noted  Patient is currently controlled on the following medications:  Euthyrox 45mcg daily  Intervention: (addressed at previous visits)  Continue current therapy   Gout:   Goal Uric Acid < 6 mg/dL   Last uric acid was not  at goal (UA = 8.4 on 03/14/2020) but patient denies symptoms of gout at this time.  Current medications:  Allopurinol 100mg  - take 2 tablets = 200mg  daily Colchicine 0.6mg  up to bid as needed for acute gout Medications that may increase uric acid levels: Aspirin Last gout flare: Feb 2022 Last allopurinol refill was 08/12/2020 for 90 DS Patient has failed these meds in past: None noted   Interventions:  Reviewed preventative versus treatment medications for gout.  Continue current therapy Consider checking Uric acid with next labs.    Medication management:   Patient was using scissors to cut her pills. Provided her with a pill cutter after last appointment (was mailed to patient at her home)  Patient will be due to have medications refills soon. Tried to go thru medications and determine what needed to be refills. Patient states she feels that she has made a mess of her pill bottled and weekly pill boxes.  Current pharmacy:  [facility] Intervention:  Made appointment for home visit to review medication with patient on 12/31/2020 Reviewed each medication and reason for taking. Noted reason for each medication on patient's medication list. (Completed at previous visit) Contacted patient's daughter about assisting with medication weekly boxes to improve adherence. (Completed at last visit)    Patient Goals/Self-Care Activities Over the next 90 days, patient will:  take medications as prescribed, focus on medication adherence by filing prescriptions at one pharmacy, and call if any issues with medications - especially with splitting tablets or swallowing tablets.   Follow Up Plan:  will have a face to face home visit in 2 days  to review medication and assist patient with filling pill boxes, provided education about meds and reorder needed prescriptions.                      Medication Assistance: None required.  Patient affirms current coverage meets needs.  Patient's preferred pharmacy is:  [facility], [place] [address] Phone: [phone] Fax: [phone]  [facility] - [place], [place] - [address] [place] [address] Phone: [phone] Fax: [phone]  Follow Up:  Patient agrees to Care Plan and Follow-up.  Plan: 2 days - home visit / face to face visit to help organize medications and pill containers.    Cherre Robins, PharmD Clinical Pharmacist [facility] [phone]

## 2020-12-29 NOTE — Chronic Care Management (AMB) (Signed)
Chronic Care Management Pharmacy Note  12/29/2020 Name:  Veronica Wolf MRN:  [id number] DOB:  1940-05-08  Summary: Patient is due to refill her monthly medication in the next 5 days. However when I called today she was not sure what she really needed. States "Veronica Wolf I think I have messed this up".   Recommendations/Changes made from today's visit: Will meet with patient face to face 12/31/20 to help fill weekly pill reminders and discuss her trying packaging if it would make it easier for her. Asked for family to be present during visit to assist.  Subjective: Veronica Wolf is an 80 y.o. year old female who is a primary patient of Debbrah Alar, NP.  The CCM team was consulted for assistance with disease management and care coordination needs.    Engaged with patient face to face for follow up visit in response to provider referral for pharmacy case management and/or care coordination services.   Consent to Services:  The patient was given information about Chronic Care Management services, agreed to services, and gave verbal consent prior to initiation of services.  Please see initial visit note for detailed documentation.   Patient Care Team: Debbrah Alar, NP as PCP - General (Internal Medicine) Revankar, Reita Cliche, MD as PCP - Cardiology (Cardiology) Michiel Cowboy, RN as [facility] Cherre Robins, PharmD (Pharmacist)  Recent office visits: 12/03/2020- PCP Veronica Castle, NP) Seen for back pain. Prescribed Medrol dose pack and ordered Xay. Also checked urinalysis and recommended she complete course of ciprofloxacin given while she was in [place].  09/16/2020 - PCP Phone Call. Ultrasound showed fatty liver. Recommended follow up with GI.  09/08/2020 - PCP Veronica Wolf) Seen for abdominal pain and diarrhea. Hemoccult negative. Reordered Abdominal CT, LFTs and lipase, CBC. No medication changes 08/18/2020 - PCP Veronica Wolf) - follow up chronic  conditions. Atorvastatin increased to 80mg  daily after LDL checked and found to be 125; Also added aspirin 81mg  daily due to h/o MI.  Recent consult visits: 09/17/2020 - Cardio (Dr Harriet Masson) CAD / intermittant chest pain. Started low dose Imdue 15mg  daily and ezetimibe 10mg  daily. Ordered nucular stress test  GI - see below / EGD done at [facility] visits: 08/13/2020 - EGD at [facility] - Dr Bryan Lemma 06/11/2020 - ED Visit for epigastric pain. Has esophageal stricture  Plan to get CBC, CMP, troponin x2, CT abdomen pelvis.  Will also      contact her GI doctor regarding recommendations. CT abdomen pelvis unremarkable.  Troponin negative x2. Discussed with Dr. Bryan Lemma (her GI doctor).  He states that if the work-up is negative, he plans to follow-up with her next week for a esophageal dilation and      look for stomach ulcer. Prescribed metoclopramide 10mg  q6 hours as needed   Objective:  Lab Results  Component Value Date   CREATININE 1.38 (H) 08/18/2020   CREATININE 1.23 (H) 06/11/2020   CREATININE 1.45 (H) 03/14/2020    Lab Results  Component Value Date   HGBA1C 6.2 (H) 09/17/2020   Last diabetic Eye exam: No results found for: HMDIABEYEEXA  Last diabetic Foot exam: No results found for: HMDIABFOOTEX      Component Value Date/Time   CHOL 204 (H) 08/18/2020 1203   TRIG 182.0 (H) 08/18/2020 1203   HDL 42.30 08/18/2020 1203   CHOLHDL 5 08/18/2020 1203   VLDL 36.4 08/18/2020 1203   LDLCALC 125 (H) 08/18/2020 1203   LDLCALC 108 (H)  03/14/2020 1439    Hepatic Function Latest Ref Rng & Units 09/08/2020 08/18/2020 06/11/2020  Total Protein 6.0 - 8.3 g/dL 6.3 6.6 6.7  Albumin 3.5 - 5.2 g/dL 3.6 3.8 3.4(L)  AST 0 - 37 U/L $Remo'10 12 15  'AhlpS$ ALT 0 - 35 U/L $Remo'5 7 9  'FwmqI$ Alk Phosphatase 39 - 117 U/L 64 80 64  Total Bilirubin 0.2 - 1.2 mg/dL 0.4 0.2 0.3  Bilirubin, Direct 0.0 - 0.3 mg/dL 0.1 - -    Lab Results  Component Value Date/Time   TSH 3.57 08/18/2020 12:03 PM   TSH  1.77 03/14/2020 02:39 PM   FREET4 0.85 01/24/2018 03:04 PM    CBC Latest Ref Rng & Units 09/08/2020 06/11/2020 01/23/2018  WBC 4.0 - 10.5 K/uL 7.3 9.1 8.3  Hemoglobin 12.0 - 15.0 g/dL 11.6(L) 11.5(L) 11.9(L)  Hematocrit 36.0 - 46.0 % 36.1 38.1 36.6  Platelets 150.0 - 400.0 K/uL 219.0 219 190.0    No results found for: VD25OH  Clinical ASCVD: Yes  The ASCVD Risk score (Arnett DK, et al., 2019) failed to calculate for the following reasons:   The 2019 ASCVD risk score is only valid for ages 77 to 38   The patient has a prior MI or stroke diagnosis    Other:  DEXA 03/05/2014             AP LUMBAR SPINE not used for calculation due to significant degenerative changes.             Left FOREARM (1/3 RADIUS) T Score: -0.4             Left FEMUR neck T-Score: -0.7     Social History   Tobacco Use  Smoking Status Former   Types: Cigarettes   Quit date: 04/24/2000   Years since quitting: 20.6  Smokeless Tobacco Never   BP Readings from Last 3 Encounters:  12/03/20 (!) 110/55  09/17/20 100/60  09/08/20 100/63   Pulse Readings from Last 3 Encounters:  12/03/20 87  09/17/20 87  09/08/20 65   Wt Readings from Last 3 Encounters:  12/03/20 188 lb (85.3 kg)  09/17/20 205 lb (93 kg)  09/08/20 202 lb 9.6 oz (91.9 kg)    Assessment: Review of patient past medical history, allergies, medications, health status, including review of consultants reports, laboratory and other test data, was performed as part of comprehensive evaluation and provision of chronic care management services.   SDOH:  (Social Determinants of Health) assessments and interventions performed:    CCM Care Plan  Allergies  Allergen Reactions   Ibuprofen Other (See Comments)    Pt has hx of peptic ulcer and diverticulitis.     Medications Reviewed Today     Reviewed by Michiel Cowboy, RN (Registered Nurse) on 12/16/20 at 1337  Med List Status: <None>   Medication Order Taking? Sig Documenting Provider Last Dose  Status Informant  albuterol (PROVENTIL) (2.5 MG/3ML) 0.083% nebulizer solution [id number] Yes USE 1 VIAL IN NEBULIZER EVERY 6 HOURS AS NEEDED FOR WHEEZING AND FOR SHORTNESS OF [name], Lenna Sciara, NP Taking Active   allopurinol (ZYLOPRIM) 100 MG tablet [id number] Yes Take 2 tablets (200 mg total) by mouth daily. Debbrah Alar, NP Taking Active   aspirin 81 MG EC tablet [id number] Yes Take 1 tablet (81 mg total) by mouth daily. Swallow whole. Tobb, Godfrey Pick, DO Taking Active   atorvastatin (LIPITOR) 80 MG tablet [id number] Yes Take 1 tablet (80 mg total) by mouth daily. Berniece Salines, DO Taking  Active   colchicine 0.6 MG tablet [id number] Yes Take 0.6 mg by mouth daily as needed (gout). [provider] Taking Active   diazepam (VALIUM) 10 MG tablet [id number] Yes TAKE ONE TABLET BY MOUTH EVERY TWELVE HOURS AS NEEDED FOR ANXIETY Debbrah Alar, NP Taking Active   dicyclomine (BENTYL) 10 MG capsule [id number] Yes Take 1 capsule (10 mg total) by mouth in the morning, at noon, in the evening, and at bedtime. Debbrah Alar, NP Taking Active   escitalopram (LEXAPRO) 20 MG tablet [id number] Yes Take 1 tablet (20 mg total) by mouth daily. Debbrah Alar, NP Taking Active   ezetimibe (ZETIA) 10 MG tablet [id number] Yes Take 1 tablet (10 mg total) by mouth daily. Berniece Salines, DO Taking Active   famotidine (PEPCID) 20 MG tablet [id number] Yes Take 1 tablet (20 mg total) by mouth 2 (two) times daily. Cirigliano, Vito V, DO Taking Active   isosorbide mononitrate (IMDUR) 30 MG 24 hr tablet [id number] Yes Take 0.5 tablets (15 mg total) by mouth daily. Berniece Salines, DO Taking Active   levothyroxine (EUTHYROX) 25 MCG tablet [id number] Yes TAKE 1 TABLET BY MOUTH ONCE DAILY BEFORE Adaline Sill, NP Taking Active   lisinopril (ZESTRIL) 2.5 MG tablet [id number] Yes Take 1 tablet (2.5 mg total) by mouth daily. Tobb, Kardie, DO Taking Active   meclizine (ANTIVERT) 25 MG tablet [id number]  Yes TAKE ONE TABLET BY MOUTH TWICE DAILY AS NEEDED FOR dizziness Debbrah Alar, NP Taking Active   methylPREDNISolone (MEDROL DOSEPAK) 4 MG TBPK tablet [id number] Yes Please take per package instructions. Debbrah Alar, NP Taking Active   metoprolol succinate (TOPROL-XL) 25 MG 24 hr tablet [id number] Yes Take 0.5 tablets (12.5 mg total) by mouth daily. Tobb, Kardie, DO Taking Active   nitroGLYCERIN (NITROSTAT) 0.4 MG SL tablet [id number] Yes Place 1 tablet (0.4 mg total) under the tongue every 5 (five) minutes as needed for chest pain. Tobb, Kardie, DO Taking Active   nystatin (NYSTATIN) powder [id number] Yes APPLY  POWDER TOPICALLY TO AFFECTED AREA 2 TIMES DAILY  Patient taking differently: APPLY  POWDER TOPICALLY TO AFFECTED AREA 2 TIMES DAILY   Debbrah Alar, NP Taking Active   pantoprazole (PROTONIX) 40 MG tablet [id number] Yes Take 1 tablet (40 mg total) by mouth 2 (two) times daily. Debbrah Alar, NP Taking Active   potassium chloride SA (KLOR-CON) 20 MEQ tablet [id number] Yes Take 1 tablet (20 mEq total) by mouth daily. Debbrah Alar, NP Taking Active   sucralfate (CARAFATE) 1 g tablet [id number] Yes TAKE ONE TABLET BY MOUTH FOUR TIMES DAILY. MAY cut pill in half AND DISSOLVE in water prior TO drinking Cirigliano, Dominic Pea, DO Taking Active             Patient Active Problem List   Diagnosis Date Noted   Back pain 12/03/2020   Acute cystitis without hematuria 12/03/2020   Grief reaction 12/03/2020   Fatty liver 12/01/2020   Other chest pain 09/17/2020   Coronary artery disease involving native coronary artery of native heart 67/20/9470   Metabolic syndrome 96/28/3662   Prediabetes 09/17/2020   Allergy 09/16/2020   Anxiety 09/16/2020   Hypertension 09/16/2020   Vertigo 09/16/2020   Dark stools 09/08/2020   Dysphagia    Esophageal stricture    Hiatal hernia    Hypothyroid 01/25/2018   Mild CAD 10/03/2017   Stress-induced cardiomyopathy 09/19/2017    Hypokalemia 09/14/2017   Non-ST elevation (NSTEMI) myocardial infarction [facility]) - due to Demand Infarction 09/14/2017   CKD (chronic  kidney disease) 04/04/2017   HLD (hyperlipidemia)    Allergic rhinitis    Gout 02/28/2017   Depression with anxiety 02/28/2017   Epigastric abdominal pain 12/22/2016   COPD with chronic bronchitis ([place]) 12/22/2016   GERD (gastroesophageal reflux disease) 12/22/2016   DOE (dyspnea on exertion) 12/22/2016   Peptic ulcer 01/04/2003    Immunization History  Administered Date(s) Administered   Fluad Quad(high Dose 65+) 03/14/2020, 12/03/2020   Influenza, High Dose Seasonal PF 01/23/2018, 12/07/2018   Influenza-Unspecified 12/23/2018   PFIZER(Purple Top)SARS-COV-2 Vaccination 05/14/2019, 06/08/2019   Pneumococcal Conjugate-13 07/16/2015   Pneumococcal Polysaccharide-23 12/23/2016   Tdap 09/02/2009, 04/05/2017    Conditions to be addressed/monitored: CAD, HTN, HLD, COPD, Anxiety, Depression and gout, GERD ; hypothyroidism  Care Plan : General Pharmacy (Adult)  Updates made by Cherre Robins, PHARMD since 12/29/2020 12:00 AM     Problem: Medication management and Monitoring and Coordination of Care for Chronic Conditions   Priority: High  Onset Date: 09/02/2020     Long-Range Goal: Medication management   Start Date: 09/02/2020  Recent Progress: On track  Priority: High  Note:     Current Barriers:  Unable to achieve control of hyperlipidemia  Does not adhere to prescribed medication regimen Does not maintain contact with provider office  Pharmacist Clinical Goal(s):  Over the next 90 days, patient will achieve adherence to monitoring guidelines and medication adherence to achieve therapeutic efficacy achieve control of hyperlipidemia as evidenced by LDL < 70 adhere to prescribed medication regimen as evidenced by filling prescriptions on times contact provider office for questions/concerns as evidenced notation of same in electronic health record  through collaboration with PharmD and provider.   Interventions: 1:1 collaboration with Debbrah Alar, NP regarding development and update of comprehensive plan of care as evidenced by provider attestation and co-signature Inter-disciplinary care team collaboration (see longitudinal plan of care) Comprehensive medication review performed; medication list updated in electronic medical record  Hypertension: Controlled; BP goal is:  <130/80 Previous Office BP low at 95/52  Patient is not checking BP at home - states she does not have BP cuff.  Patient has failed these meds in the past: None noted  Current Regimen: Metoprolol Succinate 25mg  0.5 tab daily Lisinopril 2.5mg  daily Interventions:  Reviewed refill records and assessed for adherence issues. Discussed importance of taking metoprolol and lisinopril for heart health Reviewed signs and symptoms of low BP to monitor for.  Discussed BP goal Recommended get BP cuff and check BP 2 to 3 times per week.  Continue current medications    Hyperlipidemia / history of MI: Not controlled; LDL goal < 70  Patient has failed these meds in past: None noted  Statin has not been filled since 08/12/2020. Patient brought in bottles of medications and has 2 full bottles of atorvastatin 40mg  and a full bottle of atorvastatin 80mg .  Patient states she just stopped taking atorvastatin because she was taking so many other medications Current therapy Atorvastatin 80mg  daily (increased 08/18/2020) Ezetimibe 10mg  daily (added 09/17/2020)  Metoprolol Succinate 25mg  1/2 tab daily Aspirin 81mg  daily  Interventions:  Discussed LDL goal and recent LDL results Discussed importance of taking atorvastatin daily to both lower cholesterol and prevent ASCVD / recurrent MI - patient states now that she knows the benefits she will take her cholesterol medications  Chronic Obstructive Pulmonary Disease: Controlled Current treatment: Albuterol solution for  nebulizer - use in nebulizer up to every 6 hours as needed for shortness of breath No exacerbations requiring treatment in  the last 6 months  Interventions: None  Depression/Anxiety:  Controlled Patient reports she is feeling so much better since back pain has improved. It has also helped with her mood. Patient has failed these meds in past: citalopram (ineffective) Current treatment: Escitalopram 20mg  daily Diazepam 10mg  every 12 hours as needed  Interventions:  Discussed adherence and importance of taking escitalopram for depression / anxiety Continue current therapy  Hypothyroidism:  TSH was WNL at last check on 08/18/2020 Patient has failed these meds in past: None noted  Patient is currently controlled on the following medications:  Euthyrox 46mcg daily  Intervention: (addressed at previous visits)  Continue current therapy   Gout:   Goal Uric Acid < 6 mg/dL   Last uric acid was not at goal (UA = 8.4 on 03/14/2020) but patient denies symptoms of gout at this time.  Current medications:  Allopurinol 100mg  - take 2 tablets = 200mg  daily Colchicine 0.6mg  up to bid as needed for acute gout Medications that may increase uric acid levels: Aspirin Last gout flare: Feb 2022 Last allopurinol refill was 08/12/2020 for 90 DS Patient has failed these meds in past: None noted   Interventions:  Reviewed preventative versus treatment medications for gout.  Continue current therapy Consider checking Uric acid with next labs.    Medication management:   Patient was using scissors to cut her pills. Provided her with a pill cutter after last appointment (was mailed to patient at her home)  Patient will be due to have medications refills soon. Tried to go thru medications and determine what needed to be refills. Patient states she feels that she has made a mess of her pill bottled and weekly pill boxes.  Current pharmacy:  [facility] Intervention:  Made appointment  for home visit to review medication with patient on 12/31/2020 Reviewed each medication and reason for taking. Noted reason for each medication on patient's medication list. (Completed at previous visit) Contacted patient's daughter about assisting with medication weekly boxes to improve adherence. (Completed at last visit)    Patient Goals/Self-Care Activities Over the next 90 days, patient will:  take medications as prescribed, focus on medication adherence by filing prescriptions at one pharmacy, and call if any issues with medications - especially with splitting tablets or swallowing tablets.   Follow Up Plan:  will have a face to face home visit in 2 days to review medication and assist patient with filling pill boxes, provided education about meds and reorder needed prescriptions.                      Medication Assistance: None required.  Patient affirms current coverage meets needs.  Patient's preferred pharmacy is:  [place], [place] [address] Phone: [phone] Fax: [phone]  [facility] - [place], [place] - [address] [place] [address] Phone: [phone] Fax: [phone]  Follow Up:  Patient agrees to Care Plan and Follow-up.  Plan: face to face visit to assist with medications / refills - 2 days  Cherre Robins, PharmD Clinical Pharmacist [facility] [phone]

## 2020-12-30 NOTE — Progress Notes (Signed)
I have personally reviewed this encounter including the documentation in this note and have collaborated with the care management provider regarding care management and care coordination activities to include development and update of the comprehensive care plan. I am certifying that I agree with the content of this note and encounter as supervising provider.  Kipp Shank O'Sullivan NP  

## 2020-12-31 ENCOUNTER — Ambulatory Visit: Payer: Medicare HMO | Admitting: Pharmacist

## 2020-12-31 DIAGNOSIS — E785 Hyperlipidemia, unspecified: Secondary | ICD-10-CM

## 2020-12-31 DIAGNOSIS — Z79899 Other long term (current) drug therapy: Secondary | ICD-10-CM

## 2020-12-31 DIAGNOSIS — R413 Other amnesia: Secondary | ICD-10-CM

## 2020-12-31 DIAGNOSIS — I10 Essential (primary) hypertension: Secondary | ICD-10-CM

## 2020-12-31 DIAGNOSIS — I2510 Atherosclerotic heart disease of native coronary artery without angina pectoris: Secondary | ICD-10-CM

## 2021-01-02 DIAGNOSIS — I10 Essential (primary) hypertension: Secondary | ICD-10-CM

## 2021-01-02 DIAGNOSIS — E039 Hypothyroidism, unspecified: Secondary | ICD-10-CM | POA: Diagnosis not present

## 2021-01-02 DIAGNOSIS — E785 Hyperlipidemia, unspecified: Secondary | ICD-10-CM

## 2021-01-02 DIAGNOSIS — I2510 Atherosclerotic heart disease of native coronary artery without angina pectoris: Secondary | ICD-10-CM | POA: Diagnosis not present

## 2021-01-02 DIAGNOSIS — F418 Other specified anxiety disorders: Secondary | ICD-10-CM

## 2021-01-02 NOTE — Patient Instructions (Signed)
Visit Information  PATIENT GOALS:  Goals Addressed             This Visit's Progress    Chronic Care Management Pharmacy Care Plan       CARE PLAN ENTRY (see longitudinal plan of care for additional care plan information)  Current Barriers:  Chronic Disease Management support, education, and care coordination needs related to Hyperlipidemia/Hx of MI, Pre-Diabetes, Hypothyroidism, Depression/Anxiety, GERD, Gout    Hypertension Screening BP Readings from Last 3 Encounters:  12/03/20 (!) 110/55  09/17/20 100/60  09/08/20 100/63  Pharmacist Clinical Goal(s): Over the next 90 days, patient will work with PharmD and providers to maintain BP goal <130/80 Current regimen:  Metoprolol Succinate 25mg  0.5 tab daily Lisinopril 2.5mg  daily Interventions: Discussed blood pressure goal Consider purchasing blood pressure cuff for home use and check blood pressure 2 to 3 times per week and record Reviewed refill records and assessed for adherence issues. Discussed importance of taking metoprolol and lisinopril for heart health Patient self care activities - Over the next 90 days, patient will: Maintain blood pressure less than 130/80 Consider purchasing blood pressure cuff and check blood pressure 2 to 3 times per week and record Continue current medications    Hyperlipidemia / Heart health Lab Results  Component Value Date/Time   LDLCALC 125 (H) 08/18/2020 12:03 PM   LDLCALC 108 (H) 03/14/2020 02:39 PM  Pharmacist Clinical Goal(s): Over the next 90 days, patient will work with PharmD and providers to achieve LDL goal < 70 Current regimen:  Atorvastatin 80mg  daily  Ezetimibe 10mg  daily Aspirin 81mg  daily  Metoprolol succinate ER 25mg  - take 0.5 tablet daily Interventions: Discussed LDL goal and importance of medication adherence Repeat LDL at next office visit since ezetimibe started Discussed adherence Patient self care activities - Over the next 90 days, patient will: Continue  medications current prescribed to lower cholesterol and to prevent heart attack Coordinated refill for metoprolol with patient's pharmacy  Anxiety / Depression:  Pharmacist Clinical Goal(s): Over the next 45 days, patient will work with PharmD and providers to maintain control of anxiety and depression symptoms Current regimen:  Escitalopram 20mg  daily  Diazepam 10mg  take 1 tablet every 12 hours if needed for anxiety Interventions: Addressed at previous visit Patient self care activities - Over the next 90 days, patient will: Maintain current medication regimen for anxiety and depression   Gout:  Pharmacist Clinical Goal(s): Over the next 90 days, patient will work with PharmD and providers to prevent acute gout episodes / lower uric acid Current regimen:  Allopurinol 100mg  - take 2 tablets = 200mg  daily (prevention) Colchicine 0.6mg  take up to twice a day as needed for gout attack Interventions: Discussed difference between maintenance / presentation medication and treatment Patient self care activities - Over the next 90 days, patient will: Maintain current medication regimen for gout  Pre-Diabetes Lab Results  Component Value Date/Time   HGBA1C 6.2 (H) 09/17/2020 10:38 AM   HGBA1C 6.1 06/12/2018 01:58 PM  Pharmacist Clinical Goal(s): Over the next 90 days, patient will work with PharmD and providers to maintain A1c goal <6.5% Current regimen:  Diet and exercise management   Interventions: Discussed the importance of limiting carbohydrates (30-45 grams per meal) Patient self care activities - Over the next 90 days, patient will: Maintain a1c <6.5%  Hypothyroidism Pharmacist Clinical Goal(s) Over the next 90 days, patient will work with PharmD and providers to maintain TSH within normal limits and reduce risk of symptoms associated with hypothyroidism Current  regimen:  Euthyrox / levothyroxine daily Interventions: Discussed importance of medication  adherence Reminded to separate Euthyrox / levothyroxine dose by 30 minutes from breakfast and morning medications Patient self care activities - Over the next 90 days, patient will: Maintain hypothyroidism medication regimen  Medication management Pharmacist Clinical Goal(s): Over the next 90 days, patient will work with PharmD and providers to achieve optimal medication adherence Current [facility]: [facility] switched to [facility] Interventions Comprehensive medication review performed. Utilize [facility] pharmacy for medication synchronization, packaging and delivery Assisted patient in identifying medications  Coordinated converting patient to using packaging with [facility] Reviewed each medication and reason for taking. Noted reason for each medication on patient's medication list.   Patient self care activities - Over the next 90 days, patient will: Focus on medication adherence by filling and taking medications appropriately  Take medications as prescribed Purchase a pill cutter at the pharmacy.  Report any questions or concerns to PharmD and/or provider(s) Contact Kynslei Art, PharmD if any issues with cost of medications or difficulty swallowing tablets.   Please see past updates related to this goal by clicking on the "Past Updates" button in the selected goal          The patient verbalized understanding of instructions, educational materials, and care plan provided today and declined offer to receive copy of patient instructions, educational materials, and care plan.   Telephone follow up appointment with care management team member scheduled for: 7 to 10 days  [name], PharmD Clinical Pharmacist [facility]

## 2021-01-02 NOTE — Chronic Care Management (AMB) (Signed)
Chronic Care Management Pharmacy Note  01/02/2021 Name:  Veronica Wolf MRN:  [id number] DOB:  1940-11-20  Summary: Patient is unsure if she is taking her medications correctly. She is concerned that she has "Messed up" her pill boxes. Met with her in her home to assist in setting up her pill boxes.   Recommendations/Changes made from today's visit: Patient will change to using pill packaging system.  Coordinated with pharmacy to switch over to pill packaging.  Coordinated refill for metoprolol - will need 7 to 10 day supply to last until packaging arrives. Patient has plenty of other medications  Subjective: Veronica Wolf is an 80 y.o. year old female who is a primary patient of Debbrah Alar, NP.  The CCM team was consulted for assistance with disease management and care coordination needs.    Engaged with patient face to face for follow up visit in response to provider referral for pharmacy case management and/or care coordination services.   Consent to Services:  The patient was given information about Chronic Care Management services, agreed to services, and gave verbal consent prior to initiation of services.  Please see initial visit note for detailed documentation.   Patient Care Team: Debbrah Alar, NP as PCP - General (Internal Medicine) Revankar, Reita Cliche, MD as PCP - Cardiology (Cardiology) Michiel Cowboy, RN as [facility] Cherre Robins, PharmD (Pharmacist)  Recent office visits: 12/03/2020- PCP Inda Castle, NP) Seen for back pain. Prescribed Medrol dose pack and ordered Xay. Also checked urinalysis and recommended she complete course of ciprofloxacin given while she was in [place].  09/16/2020 - PCP Phone Call. Ultrasound showed fatty liver. Recommended follow up with GI.  09/08/2020 - PCP Inda Castle) Seen for abdominal pain and diarrhea. Hemoccult negative. Reordered Abdominal CT, LFTs and lipase, CBC. No medication  changes 08/18/2020 - PCP Inda Castle) - follow up chronic conditions. Atorvastatin increased to $RemoveBefo'80mg'yAicWlDUuvW$  daily after LDL checked and found to be 125; Also added aspirin $RemoveBefor'81mg'IzAlwoPdlFjb$  daily due to h/o MI.  Recent consult visits: 09/17/2020 - Cardio (Dr Harriet Masson) CAD / intermittant chest pain. Started low dose Imdue $RemoveBe'15mg'OeXTPKPyS$  daily and ezetimibe $RemoveBefor'10mg'vKTlvreuJyBu$  daily. Ordered nucular stress test  GI - see below / EGD done at [facility] visits: 08/13/2020 - EGD at [place]   Objective:  Lab Results  Component Value Date   CREATININE 1.38 (H) 08/18/2020   CREATININE 1.23 (H) 06/11/2020   CREATININE 1.45 (H) 03/14/2020    Lab Results  Component Value Date   HGBA1C 6.2 (H) 09/17/2020   Last diabetic Eye exam: No results found for: HMDIABEYEEXA  Last diabetic Foot exam: No results found for: HMDIABFOOTEX      Component Value Date/Time   CHOL 204 (H) 08/18/2020 1203   TRIG 182.0 (H) 08/18/2020 1203   HDL 42.30 08/18/2020 1203   CHOLHDL 5 08/18/2020 1203   VLDL 36.4 08/18/2020 1203   LDLCALC 125 (H) 08/18/2020 1203   LDLCALC 108 (H) 03/14/2020 1439    Hepatic Function Latest Ref Rng & Units 09/08/2020 08/18/2020 06/11/2020  Total Protein 6.0 - 8.3 g/dL 6.3 6.6 6.7  Albumin 3.5 - 5.2 g/dL 3.6 3.8 3.4(L)  AST 0 - 37 U/L $Remo'10 12 15  'xmucy$ ALT 0 - 35 U/L $Remo'5 7 9  'fhlwP$ Alk Phosphatase 39 - 117 U/L 64 80 64  Total Bilirubin 0.2 - 1.2 mg/dL 0.4 0.2 0.3  Bilirubin, Direct 0.0 - 0.3 mg/dL 0.1 - -  Lab Results  Component Value Date/Time   TSH 3.57 08/18/2020 12:03 PM   TSH 1.77 03/14/2020 02:39 PM   FREET4 0.85 01/24/2018 03:04 PM    CBC Latest Ref Rng & Units 09/08/2020 06/11/2020 01/23/2018  WBC 4.0 - 10.5 K/uL 7.3 9.1 8.3  Hemoglobin 12.0 - 15.0 g/dL 11.6(L) 11.5(L) 11.9(L)  Hematocrit 36.0 - 46.0 % 36.1 38.1 36.6  Platelets 150.0 - 400.0 K/uL 219.0 219 190.0    No results found for: VD25OH  Clinical ASCVD: Yes  The ASCVD Risk score (Arnett DK, et al., 2019) failed to calculate for  the following reasons:   The 2019 ASCVD risk score is only valid for ages 57 to 51   The patient has a prior MI or stroke diagnosis    Other:  DEXA 03/05/2014             AP LUMBAR SPINE not used for calculation due to significant degenerative changes.             Left FOREARM (1/3 RADIUS) T Score: -0.4             Left FEMUR neck T-Score: -0.7     Social History   Tobacco Use  Smoking Status Former   Types: Cigarettes   Quit date: 04/24/2000   Years since quitting: 20.7  Smokeless Tobacco Never   BP Readings from Last 3 Encounters:  12/03/20 (!) 110/55  09/17/20 100/60  09/08/20 100/63   Pulse Readings from Last 3 Encounters:  12/03/20 87  09/17/20 87  09/08/20 65   Wt Readings from Last 3 Encounters:  12/03/20 188 lb (85.3 kg)  09/17/20 205 lb (93 kg)  09/08/20 202 lb 9.6 oz (91.9 kg)    Assessment: Review of patient past medical history, allergies, medications, health status, including review of consultants reports, laboratory and other test data, was performed as part of comprehensive evaluation and provision of chronic care management services.   SDOH:  (Social Determinants of Health) assessments and interventions performed:    CCM Care Plan  Allergies  Allergen Reactions   Ibuprofen Other (See Comments)    Pt has hx of peptic ulcer and diverticulitis.     Medications Reviewed Today     Reviewed by Cherre Robins, PharmD (Pharmacist) on 01/02/21 at 2030  Med List Status: <None>   Medication Order Taking? Sig Documenting Provider Last Dose Status Informant  albuterol (PROVENTIL) (2.5 MG/3ML) 0.083% nebulizer solution [id number] Yes USE 1 VIAL IN NEBULIZER EVERY 6 HOURS AS NEEDED FOR WHEEZING AND FOR SHORTNESS OF [name], Lenna Sciara, NP Taking Active   allopurinol (ZYLOPRIM) 100 MG tablet [id number] Yes Take 2 tablets (200 mg total) by mouth daily. Debbrah Alar, NP Taking Active   aspirin 81 MG EC tablet [id number] Yes Take 1 tablet (81 mg total) by  mouth daily. Swallow whole. Tobb, Godfrey Pick, DO Taking Active   atorvastatin (LIPITOR) 80 MG tablet [id number] Yes Take 1 tablet (80 mg total) by mouth daily. Berniece Salines, DO Taking Active   colchicine 0.6 MG tablet [id number] No Take 0.6 mg by mouth daily as needed (gout).  Patient not taking: Reported on 01/02/2021   [provider] Not Taking Active   diazepam (VALIUM) 10 MG tablet [id number] Yes TAKE ONE TABLET BY MOUTH EVERY TWELVE HOURS AS NEEDED FOR ANXIETY Debbrah Alar, NP Taking Active   dicyclomine (BENTYL) 10 MG capsule [id number] Yes Take 1 capsule (10 mg total) by mouth in the morning, at noon, in the evening, and at bedtime.  Debbrah Alar, NP Taking Active   escitalopram (LEXAPRO) 20 MG tablet [id number] Yes Take 1 tablet (20 mg total) by mouth daily. Debbrah Alar, NP Taking Active   ezetimibe (ZETIA) 10 MG tablet [id number] Yes Take 1 tablet (10 mg total) by mouth daily. Berniece Salines, DO Taking Active   famotidine (PEPCID) 20 MG tablet [id number] Yes Take 1 tablet (20 mg total) by mouth 2 (two) times daily. Cirigliano, Vito V, DO Taking Active   isosorbide mononitrate (IMDUR) 30 MG 24 hr tablet [id number] Yes Take 0.5 tablets (15 mg total) by mouth daily. Berniece Salines, DO Taking Active   levothyroxine (EUTHYROX) 25 MCG tablet [id number] Yes TAKE 1 TABLET BY MOUTH ONCE DAILY BEFORE Adaline Sill, NP Taking Active   lisinopril (ZESTRIL) 2.5 MG tablet [id number] Yes Take 1 tablet (2.5 mg total) by mouth daily. Tobb, Godfrey Pick, DO Taking Active   meclizine (ANTIVERT) 25 MG tablet [id number] Yes TAKE ONE TABLET BY MOUTH TWICE DAILY AS NEEDED FOR dizziness Debbrah Alar, NP Taking Active   metoprolol succinate (TOPROL-XL) 25 MG 24 hr tablet [id number] Yes Take 0.5 tablets (12.5 mg total) by mouth daily. Tobb, Kardie, DO Taking Active   nitroGLYCERIN (NITROSTAT) 0.4 MG SL tablet [id number] Yes Place 1 tablet (0.4 mg total) under the tongue every 5 (five) minutes  as needed for chest pain. Tobb, Kardie, DO Taking Active   nystatin (NYSTATIN) powder [id number] Yes APPLY  POWDER TOPICALLY TO AFFECTED AREA 2 TIMES DAILY  Patient taking differently: APPLY  POWDER TOPICALLY TO AFFECTED AREA 2 TIMES DAILY   Debbrah Alar, NP Taking Active   pantoprazole (PROTONIX) 40 MG tablet [id number] Yes Take 1 tablet (40 mg total) by mouth 2 (two) times daily. Debbrah Alar, NP Taking Active   potassium chloride SA (KLOR-CON) 20 MEQ tablet [id number] Yes Take 1 tablet (20 mEq total) by mouth daily. Debbrah Alar, NP Taking Active   sucralfate (CARAFATE) 1 g tablet [id number] Yes TAKE ONE TABLET BY MOUTH FOUR TIMES DAILY. MAY cut pill in half AND DISSOLVE in water prior TO drinking Cirigliano, Dominic Pea, DO Taking Active             Patient Active Problem List   Diagnosis Date Noted   Back pain 12/03/2020   Acute cystitis without hematuria 12/03/2020   Grief reaction 12/03/2020   Fatty liver 12/01/2020   Other chest pain 09/17/2020   Coronary artery disease involving native coronary artery of native heart 94/50/3888   Metabolic syndrome 28/00/3491   Prediabetes 09/17/2020   Allergy 09/16/2020   Anxiety 09/16/2020   Hypertension 09/16/2020   Vertigo 09/16/2020   Dark stools 09/08/2020   Dysphagia    Esophageal stricture    Hiatal hernia    Hypothyroid 01/25/2018   Mild CAD 10/03/2017   Stress-induced cardiomyopathy 09/19/2017   Hypokalemia 09/14/2017   Non-ST elevation (NSTEMI) myocardial infarction [facility]) - due to Demand Infarction 09/14/2017   CKD (chronic kidney disease) 04/04/2017   HLD (hyperlipidemia)    Allergic rhinitis    Gout 02/28/2017   Depression with anxiety 02/28/2017   Epigastric abdominal pain 12/22/2016   COPD with chronic bronchitis (Apache) 12/22/2016   GERD (gastroesophageal reflux disease) 12/22/2016   DOE (dyspnea on exertion) 12/22/2016   Peptic ulcer 01/04/2003    Immunization History  Administered Date(s)  Administered   Fluad Quad(high Dose 65+) 03/14/2020, 12/03/2020   Influenza, High Dose Seasonal PF 01/23/2018, 12/07/2018   Influenza-Unspecified 12/23/2018   PFIZER(Purple Top)SARS-COV-2 Vaccination 05/14/2019, 06/08/2019   Pneumococcal Conjugate-13 07/16/2015  Pneumococcal Polysaccharide-23 12/23/2016   Tdap 09/02/2009, 04/05/2017    Conditions to be addressed/monitored: CAD, HTN, HLD, COPD, Anxiety, Depression and gout, GERD ; hypothyroidism  Care Plan : General Pharmacy (Adult)  Updates made by Cherre Robins, PHARMD since 01/02/2021 12:00 AM     Problem: Medication management and Monitoring and Coordination of Care for Chronic Conditions   Priority: High  Onset Date: 09/02/2020     Long-Range Goal: Medication management   Start Date: 09/02/2020  Recent Progress: On track  Priority: High  Note:     Current Barriers:  Unable to achieve control of hyperlipidemia  Does not adhere to prescribed medication regimen Does not maintain contact with provider office  Pharmacist Clinical Goal(s):  Over the next 90 days, patient will achieve adherence to monitoring guidelines and medication adherence to achieve therapeutic efficacy achieve control of hyperlipidemia as evidenced by LDL < 70 adhere to prescribed medication regimen as evidenced by filling prescriptions on times contact provider office for questions/concerns as evidenced notation of same in electronic health record through collaboration with PharmD and provider.   Interventions: 1:1 collaboration with Debbrah Alar, NP regarding development and update of comprehensive plan of care as evidenced by provider attestation and co-signature Inter-disciplinary care team collaboration (see longitudinal plan of care) Comprehensive medication review performed; medication list updated in electronic medical record  Hypertension: Controlled; BP goal is:  <130/80  Patient is not checking BP at home - states she does not have BP  cuff.  Patient has failed these meds in the past: None noted  Current Regimen: Metoprolol Succinate 25mg  0.5 tab daily Lisinopril 2.5mg  daily Interventions:  Reviewed refill records and assessed for adherence issues. Discussed importance of taking metoprolol and lisinopril for heart health Reviewed signs and symptoms of low blood pressure  to monitor for.  Discussed blood pressure  goal Recommended get blood pressure  cuff and check blood pressure  2 to 3 times per week.  Continue current medications    Hyperlipidemia / history of MI: Not controlled; LDL goal < 70  Patient has failed these meds in past: None noted  Statin has not been filled since 08/12/2020. Patient brought in bottles of medications and has 2 full bottles of atorvastatin 40mg  and a full bottle of atorvastatin 80mg .  Patient states she just stopped taking atorvastatin because she was taking so many other medications Current therapy Atorvastatin 80mg  daily (increased 08/18/2020) Ezetimibe 10mg  daily (added 09/17/2020)  Metoprolol Succinate 25mg  1/2 tab daily Aspirin 81mg  daily  Interventions:  Discussed LDL goal and recent LDL results Discussed importance of taking atorvastatin daily to both lower cholesterol and prevent ASCVD / recurrent MI   Chronic Obstructive Pulmonary Disease: Controlled Current treatment: Albuterol solution for nebulizer - use in nebulizer up to every 6 hours as needed for shortness of breath No exacerbations requiring treatment in the last 6 months  Interventions: None  Depression/Anxiety:  Controlled Patient reports she is feeling so much better since back pain has improved. It has also helped with her mood. Patient has failed these meds in past: citalopram (ineffective) Current treatment: Escitalopram 20mg  daily Diazepam 10mg  every 12 hours as needed  Interventions:  Discussed adherence and importance of taking escitalopram for depression / anxiety Continue current  therapy  Hypothyroidism:  TSH was WNL at last check on 08/18/2020 Patient has failed these meds in past: None noted  Patient is currently controlled on the following medications:  levothyroxine 10mcg daily  Intervention:  Continue current therapy   Gout:  Goal Uric Acid < 6 mg/dL   Last uric acid was not at goal (UA = 8.4 on 03/14/2020) but patient denies symptoms of gout at this time.  Current medications:  Allopurinol 100mg  - take 2 tablets = 200mg  daily Colchicine 0.6mg  up to bid as needed for acute gout Medications that may increase uric acid levels: Aspirin Last gout flare: Feb 2022 Patient has failed these meds in past: None noted   Interventions:  Reviewed preventative versus treatment medications for gout.  Continue current therapy Consider checking Uric acid with next labs.    Medication management:   Patient was using scissors to cut her pills. Provided her with a pill cutter after last appointment (was mailed to patient at her home)  Evaluated patient's current medication administration process.  Patient has taken all medication out of the prescription bottle and placed in weekly pill container however she did not fill the container by the day, instead she had all her metoprolol in Monday's slot; all her levothyroxine in Tuesday's slot, all her pantoprazole in Wednesday's slot, etc. She has 2 weekly pill containers like this but could not identify which pills were in each slot. She states she would just take 1 tablet every morning from each slot.  She did appear to have all prescribed meds with the exception of sucralfate and dicyclomine like this. She states she did not have these 2 in pill containers because she takes more than once per day.  Current pharmacy:  [facility]  [facility] Intervention:  Assisted patient in placing a few tablets back in prescription bottle so that she would be able to identify tablets in the pill boxes We discussed pill packaging.  Patient feels like this might be a good option for her.  Coordinated with [facility] to start process of converting her over to packaging.  She should have enough of all her medications to last until packaging if available with the exception of metoprolol Coordinate with [facility] to fill #5 tablets = 10 days supply of metoprolol and will be delivered 01/01/2021 Reviewed each medication and reason for taking. Noted reason for each medication on patient's medication list.    Patient Goals/Self-Care Activities Over the next 90 days, patient will:  take medications as prescribed, focus on medication adherence by filing prescriptions at one pharmacy, and call if any issues with medications - especially with splitting tablets or swallowing tablets.   Follow Up Plan:  will check back in with patient in 7 to 10 days once packaging arrives to make sure she does not have any questions.                      Medication Assistance: None required.  Patient affirms current coverage meets needs.  Patient's preferred pharmacy is:  [facility], [place] [address] Phone: [phone] Fax: [phone]  [facility] - [place], [place] - [address] [place] [address] Phone: [phone] Fax: [phone]  Follow Up:  Patient agrees to Care Plan and Follow-up.  Plan: follow up in 7 to 10 days to review packaging and make sure patient has not questions.   Cherre Robins, PharmD Clinical Pharmacist [facility] [phone]

## 2021-01-07 ENCOUNTER — Telehealth: Payer: Self-pay

## 2021-01-07 ENCOUNTER — Telehealth: Payer: Self-pay | Admitting: Family

## 2021-01-07 ENCOUNTER — Other Ambulatory Visit: Payer: Self-pay | Admitting: Gastroenterology

## 2021-01-07 NOTE — Telephone Encounter (Signed)
Patient called to get a refill on medicine but couldn't remember which one, she stated she had been having chest pain and wanted a refill of the medicine for it. She was triaged to the nurse for further treatment and help.

## 2021-01-07 NOTE — Telephone Encounter (Signed)
I accepted a Triage call regarding the pt. I called the pt after Triage hung up with her, she again refused to go to the ER. She wanted to schedule an appointment with PCP. She said she felt better and wanted to wait and see what happened.   I caught Melissa as she was leaving the restroom and let her know of prior events and she advised me to to call the pt back and let her know that she thought it was a good idea for her to go to the ER as well and call Cardiology to let them know what's going on and that's who refills her Nitroglycerin. I called the pt back and let her know Melissa's message- Pt aware and voices understanding. Appointment is set however on 01/09/21 with PCP.    -----Triage Call------ "Patient Name: Veronica Wolf Gender: Female DOB: [birth year] Phone Number: [phone] Chief Complaint CHEST PAIN - pain, pressure, heaviness or tightness Reason for Call Symptomatic / Request for Health Information Initial Comment Caller states she's having chest pain. Translation No Nurse Assessment Nurse: D'Heur Ezzard Standing, RN, Adrienne Date/Time (Eastern Time): 01/07/2021 11:30:04 AM Confirm and document reason for call. If symptomatic, describe symptoms. ---Caller states she's having chest pain that started early this morning. The pain is in the middle "straight down" to her stomach. The pain is constant since she woke this morning and is aching. She takes nitroglycerin for angina but she is currently out. She states she wants to get an appointment. Does the patient have any new or worsening symptoms? ---Yes Will a triage be completed? ---Yes Related visit to physician within the last 2 weeks? ---No Does the PT have any chronic conditions? (i.e. diabetes, asthma, this includes High risk factors for pregnancy, etc.) ---Yes List chronic conditions. ---Angina, diabetes, unsure of other issues-has "a whole list of meds" Is this a behavioral health or substance abuse call?  ---No Guidelines Guideline Title Affirmed Question Affirmed Notes Nurse Date/Time Lamount Cohen Time) Chest Pain [1] Chest pain lasts > 5 minutes AND [2] age > 11 D'Heur Ezzard Standing, RN, Adrienne 01/07/2021 11:36:31 AM Disp. Time Lamount Cohen Time) Disposition Final User 01/07/2021 11:28:05 AM Send to Urgent Marilu Favre, Alexus Time) Disposition Final User 01/07/2021 11:42:21 AM 911 Outcome Documentation D'Heur Ezzard Standing RN, Adrienne Reason: Patient refused 01/07/2021 11:41:08 AM Call EMS 911 Now Yes D'Heur Ezzard Standing, RN, Hansel Starling Caller Disagree/Comply Disagree Caller Understands Yes PreDisposition Call Doctor Care Advice Given Per Guideline CALL EMS 911 NOW: NOTE TO TRIAGER - IF CALLER ASKS ABOUT ASPIRIN: * Call EMS 911 first. *  User: Hansel Starling, D'Heur Ezzard Standing, RN Date/Time Lamount Cohen Time): 01/07/2021 11:41:03 AM Caller states she meds to eat first. If she feels worse, she will call 911 if she still feels bad. User: Hansel Starling, D'Heur Ezzard Standing, RN Date/Time Lamount Cohen Time): 01/07/2021 11:45:47 AM Advised office of 911 disposition & pt refusal. Referrals GO TO FACILITY REFUSE"

## 2021-01-09 ENCOUNTER — Ambulatory Visit (INDEPENDENT_AMBULATORY_CARE_PROVIDER_SITE_OTHER): Payer: Medicare HMO | Admitting: Pharmacist

## 2021-01-09 ENCOUNTER — Ambulatory Visit (INDEPENDENT_AMBULATORY_CARE_PROVIDER_SITE_OTHER): Payer: Medicare HMO | Admitting: Family

## 2021-01-09 ENCOUNTER — Encounter: Payer: Self-pay | Admitting: Family

## 2021-01-09 ENCOUNTER — Other Ambulatory Visit: Payer: Self-pay

## 2021-01-09 ENCOUNTER — Other Ambulatory Visit (HOSPITAL_BASED_OUTPATIENT_CLINIC_OR_DEPARTMENT_OTHER): Payer: Self-pay

## 2021-01-09 ENCOUNTER — Telehealth: Payer: Medicare HMO

## 2021-01-09 ENCOUNTER — Ambulatory Visit: Payer: Medicare HMO | Admitting: Family

## 2021-01-09 VITALS — BP 143/93 | HR 88 | Temp 98.1°F | Resp 16 | Wt 189.0 lb

## 2021-01-09 DIAGNOSIS — I2510 Atherosclerotic heart disease of native coronary artery without angina pectoris: Secondary | ICD-10-CM

## 2021-01-09 DIAGNOSIS — Z79899 Other long term (current) drug therapy: Secondary | ICD-10-CM

## 2021-01-09 DIAGNOSIS — R1013 Epigastric pain: Secondary | ICD-10-CM

## 2021-01-09 DIAGNOSIS — E785 Hyperlipidemia, unspecified: Secondary | ICD-10-CM

## 2021-01-09 DIAGNOSIS — R413 Other amnesia: Secondary | ICD-10-CM

## 2021-01-09 LAB — COMPREHENSIVE METABOLIC PANEL
ALT: 6 U/L (ref 0–35)
AST: 14 U/L (ref 0–37)
Albumin: 3.8 g/dL (ref 3.5–5.2)
Alkaline Phosphatase: 82 U/L (ref 39–117)
BUN: 10 mg/dL (ref 6–23)
CO2: 28 mEq/L (ref 19–32)
Calcium: 8.9 mg/dL (ref 8.4–10.5)
Chloride: 106 mEq/L (ref 96–112)
Creatinine, Ser: 1.4 mg/dL — ABNORMAL HIGH (ref 0.40–1.20)
GFR: 35.27 mL/min — ABNORMAL LOW (ref >60.00)
Glucose, Bld: 78 mg/dL (ref 70–99)
Potassium: 3.8 mEq/L (ref 3.5–5.1)
Sodium: 141 mEq/L (ref 135–145)
Total Bilirubin: 0.3 mg/dL (ref 0.2–1.2)
Total Protein: 6.7 g/dL (ref 6.0–8.3)

## 2021-01-09 LAB — HEMOGLOBIN A1C: Hgb A1c MFr Bld: 6.5 % (ref 4.6–6.5)

## 2021-01-09 LAB — LIPASE: Lipase: 22 U/L (ref 11.0–59.0)

## 2021-01-09 NOTE — Chronic Care Management (AMB) (Signed)
Chronic Care Management Pharmacy Note  01/09/2021 Name:  Veronica Wolf MRN:  [id number] DOB:  10-Jun-1940  Summary: Patient has 1 tablet of lisinopril. She was scheduled to start pill packaging with [facility] but she has not received packaged meds yet.   Recommendations/Changes made from today's visit: Coordinated with pharmacy to move up scheduled packaging start day to tomorrow 01/10/2021 if possible (she has enough of meds if they cannot except for lisinopril. If they cannot get packaging out to her today, [facility] will deliver enough lisinopril to cover until packaging start date of 01/16/2021.  Coordinated refills for meclizine and diazepam with PCP.  Subjective: Veronica Wolf is an 80 y.o. year old female who is a primary patient of Debbrah Alar, NP.  The CCM team was consulted for assistance with disease management and care coordination needs.    Engaged with patient face to face for follow up visit in response to provider referral for pharmacy case management and/or care coordination services.   Consent to Services:  The patient was given information about Chronic Care Management services, agreed to services, and gave verbal consent prior to initiation of services.  Please see initial visit note for detailed documentation.   Patient Care Team: Debbrah Alar, NP as PCP - General (Internal Medicine) Revankar, Reita Cliche, MD as PCP - Cardiology (Cardiology) Michiel Cowboy, RN as [facility] Cherre Robins, PharmD (Pharmacist)  Recent office visits: 01/09/2021 - PCP Inda Castle, NP) Seen for epigatric / chest pain. Labs ordered - CBC, BMP and lipase. Also ordered CT abdomin. No med changes noted.  12/03/2020- PCP Inda Castle, NP) Seen for back pain. Prescribed Medrol dose pack and ordered Xay. Also checked urinalysis and recommended she complete course of ciprofloxacin given while she was in [place].  09/16/2020 - PCP Phone Call. Ultrasound  showed fatty liver. Recommended follow up with GI.  09/08/2020 - PCP Inda Castle) Seen for abdominal pain and diarrhea. Hemoccult negative. Reordered Abdominal CT, LFTs and lipase, CBC. No medication changes 08/18/2020 - PCP Inda Castle) - follow up chronic conditions. Atorvastatin increased to $RemoveBefo'80mg'dzisJhddcUZ$  daily after LDL checked and found to be 125; Also added aspirin $RemoveBefor'81mg'qzKiCRKlMhQD$  daily due to h/o MI.  Recent consult visits: 09/17/2020 - Cardio (Dr Harriet Masson) CAD / intermittant chest pain. Started low dose Imdue $RemoveBe'15mg'qfTeYcHmc$  daily and ezetimibe $RemoveBefor'10mg'EZEGFwpGKdrm$  daily. Ordered nucular stress test  GI - see below / EGD done at [facility] visits: 08/13/2020 - EGD at [place]   Objective:  Lab Results  Component Value Date   CREATININE 1.38 (H) 08/18/2020   CREATININE 1.23 (H) 06/11/2020   CREATININE 1.45 (H) 03/14/2020    Lab Results  Component Value Date   HGBA1C 6.2 (H) 09/17/2020   Last diabetic Eye exam: No results found for: HMDIABEYEEXA  Last diabetic Foot exam: No results found for: HMDIABFOOTEX      Component Value Date/Time   CHOL 204 (H) 08/18/2020 1203   TRIG 182.0 (H) 08/18/2020 1203   HDL 42.30 08/18/2020 1203   CHOLHDL 5 08/18/2020 1203   VLDL 36.4 08/18/2020 1203   LDLCALC 125 (H) 08/18/2020 1203   LDLCALC 108 (H) 03/14/2020 1439    Hepatic Function Latest Ref Rng & Units 09/08/2020 08/18/2020 06/11/2020  Total Protein 6.0 - 8.3 g/dL 6.3 6.6 6.7  Albumin 3.5 - 5.2 g/dL 3.6 3.8 3.4(L)  AST 0 - 37 U/L $Remo'10 12 15  'zNOUm$ ALT 0 - 35 U/L 5 7 9  Alk Phosphatase 39 - 117 U/L 64 80 64  Total Bilirubin 0.2 - 1.2 mg/dL 0.4 0.2 0.3  Bilirubin, Direct 0.0 - 0.3 mg/dL 0.1 - -    Lab Results  Component Value Date/Time   TSH 3.57 08/18/2020 12:03 PM   TSH 1.77 03/14/2020 02:39 PM   FREET4 0.85 01/24/2018 03:04 PM    CBC Latest Ref Rng & Units 09/08/2020 06/11/2020 01/23/2018  WBC 4.0 - 10.5 K/uL 7.3 9.1 8.3  Hemoglobin 12.0 - 15.0 g/dL 11.6(L) 11.5(L) 11.9(L)  Hematocrit 36.0  - 46.0 % 36.1 38.1 36.6  Platelets 150.0 - 400.0 K/uL 219.0 219 190.0    No results found for: VD25OH  Clinical ASCVD: Yes  The ASCVD Risk score (Arnett DK, et al., 2019) failed to calculate for the following reasons:   The 2019 ASCVD risk score is only valid for ages 29 to 73   The patient has a prior MI or stroke diagnosis    Other:  DEXA 03/05/2014             AP LUMBAR SPINE not used for calculation due to significant degenerative changes.             Left FOREARM (1/3 RADIUS) T Score: -0.4             Left FEMUR neck T-Score: -0.7     Social History   Tobacco Use  Smoking Status Former   Types: Cigarettes   Quit date: 04/24/2000   Years since quitting: 20.7  Smokeless Tobacco Never   BP Readings from Last 3 Encounters:  01/09/21 (!) 143/93  12/03/20 (!) 110/55  09/17/20 100/60   Pulse Readings from Last 3 Encounters:  01/09/21 88  12/03/20 87  09/17/20 87   Wt Readings from Last 3 Encounters:  01/09/21 189 lb (85.7 kg)  12/03/20 188 lb (85.3 kg)  09/17/20 205 lb (93 kg)    Assessment: Review of patient past medical history, allergies, medications, health status, including review of consultants reports, laboratory and other test data, was performed as part of comprehensive evaluation and provision of chronic care management services.   SDOH:  (Social Determinants of Health) assessments and interventions performed:    CCM Care Plan  Allergies  Allergen Reactions   Ibuprofen Other (See Comments)    Pt has hx of peptic ulcer and diverticulitis.     Medications Reviewed Today     Reviewed by Cherre Robins, PharmD (Pharmacist) on 01/09/21 at [phone]  Med List Status: <None>   Medication Order Taking? Sig Documenting Provider Last Dose Status Informant  albuterol (PROVENTIL) (2.5 MG/3ML) 0.083% nebulizer solution [id number] No USE 1 VIAL IN NEBULIZER EVERY 6 HOURS AS NEEDED FOR WHEEZING AND FOR SHORTNESS OF BREATH  Patient not taking: Reported on 01/09/2021    Debbrah Alar, NP Not Taking Active   allopurinol (ZYLOPRIM) 100 MG tablet [id number] Yes Take 2 tablets (200 mg total) by mouth daily. Debbrah Alar, NP Taking Active   aspirin 81 MG EC tablet [id number] Yes Take 1 tablet (81 mg total) by mouth daily. Swallow whole. Tobb, Godfrey Pick, DO Taking Active   atorvastatin (LIPITOR) 80 MG tablet [id number] Yes Take 1 tablet (80 mg total) by mouth daily. Berniece Salines, DO Taking Active   colchicine 0.6 MG tablet [id number] No Take 0.6 mg by mouth daily as needed (gout).  Patient not taking: Reported on 01/09/2021   [provider] Not Taking Active   diazepam (VALIUM) 10 MG tablet [id number] Yes TAKE ONE TABLET BY  MOUTH EVERY TWELVE HOURS AS NEEDED FOR ANXIETY Debbrah Alar, NP Taking Active   dicyclomine (BENTYL) 10 MG capsule [id number] Yes Take 1 capsule (10 mg total) by mouth in the morning, at noon, in the evening, and at bedtime. Debbrah Alar, NP Taking Active   escitalopram (LEXAPRO) 20 MG tablet [id number] Yes Take 1 tablet (20 mg total) by mouth daily. Debbrah Alar, NP Taking Active   ezetimibe (ZETIA) 10 MG tablet [id number] Yes Take 1 tablet (10 mg total) by mouth daily. Berniece Salines, DO Taking Active   famotidine (PEPCID) 20 MG tablet [id number] Yes Take 1 tablet (20 mg total) by mouth 2 (two) times daily. Cirigliano, Vito V, DO Taking Active   isosorbide mononitrate (IMDUR) 30 MG 24 hr tablet [id number] Yes Take 0.5 tablets (15 mg total) by mouth daily. Berniece Salines, DO Taking Active   levothyroxine (EUTHYROX) 25 MCG tablet [id number] Yes TAKE 1 TABLET BY MOUTH ONCE DAILY BEFORE Adaline Sill, NP Taking Active   lisinopril (ZESTRIL) 2.5 MG tablet [id number] Yes Take 1 tablet (2.5 mg total) by mouth daily. Tobb, Godfrey Pick, DO Taking Active   meclizine (ANTIVERT) 25 MG tablet [id number] Yes TAKE ONE TABLET BY MOUTH TWICE DAILY AS NEEDED FOR dizziness Debbrah Alar, NP Taking Active   metoprolol succinate  (TOPROL-XL) 25 MG 24 hr tablet [id number] Yes Take 0.5 tablets (12.5 mg total) by mouth daily. Tobb, Kardie, DO Taking Active   nitroGLYCERIN (NITROSTAT) 0.4 MG SL tablet [id number] Yes Place 1 tablet (0.4 mg total) under the tongue every 5 (five) minutes as needed for chest pain. Tobb, Kardie, DO Taking Active   nystatin (NYSTATIN) powder [id number] Yes APPLY  POWDER TOPICALLY TO AFFECTED AREA 2 TIMES DAILY  Patient taking differently: APPLY  POWDER TOPICALLY TO AFFECTED AREA 2 TIMES DAILY   Debbrah Alar, NP Taking Active   pantoprazole (PROTONIX) 40 MG tablet [id number] Yes Take 1 tablet (40 mg total) by mouth 2 (two) times daily. Debbrah Alar, NP Taking Active   potassium chloride SA (KLOR-CON) 20 MEQ tablet [id number] Yes Take 1 tablet (20 mEq total) by mouth daily. Debbrah Alar, NP Taking Active   sucralfate (CARAFATE) 1 g tablet [id number] Yes TAKE ONE TABLET BY MOUTH FOUR TIMES DAILY. MAY cut pill in half AND DISSOLVE in water prior TO drinking  Patient taking differently: Take 0.5 g by mouth 4 (four) times daily -  with meals and at bedtime.   Lavena Bullion, DO Taking Active             Patient Active Problem List   Diagnosis Date Noted   Back pain 12/03/2020   Acute cystitis without hematuria 12/03/2020   Grief reaction 12/03/2020   Fatty liver 12/01/2020   Other chest pain 09/17/2020   Coronary artery disease involving native coronary artery of native heart 38/01/1750   Metabolic syndrome 02/58/5277   Prediabetes 09/17/2020   Allergy 09/16/2020   Anxiety 09/16/2020   Hypertension 09/16/2020   Vertigo 09/16/2020   Dark stools 09/08/2020   Dysphagia    Esophageal stricture    Hiatal hernia    Epigastric pain    Hypothyroid 01/25/2018   Mild CAD 10/03/2017   Stress-induced cardiomyopathy 09/19/2017   Hypokalemia 09/14/2017   Non-ST elevation (NSTEMI) myocardial infarction [facility]) - due to Demand Infarction 09/14/2017   CKD (chronic kidney disease)  04/04/2017   HLD (hyperlipidemia)    Allergic rhinitis    Gout 02/28/2017   Depression with anxiety 02/28/2017   Epigastric abdominal pain 12/22/2016  COPD with chronic bronchitis ([place]) 12/22/2016   GERD (gastroesophageal reflux disease) 12/22/2016   DOE (dyspnea on exertion) 12/22/2016   Peptic ulcer 01/04/2003    Immunization History  Administered Date(s) Administered   Fluad Quad(high Dose 65+) 03/14/2020, 12/03/2020   Influenza, High Dose Seasonal PF 01/23/2018, 12/07/2018   Influenza-Unspecified 12/23/2018   PFIZER(Purple Top)SARS-COV-2 Vaccination 05/14/2019, 06/08/2019   Pneumococcal Conjugate-13 07/16/2015   Pneumococcal Polysaccharide-23 12/23/2016   Tdap 09/02/2009, 04/05/2017    Conditions to be addressed/monitored: CAD, HTN, HLD, COPD, Anxiety, Depression and gout, GERD ; hypothyroidism  Care Plan : General Pharmacy (Adult)  Updates made by Cherre Robins, PHARMD since 01/09/2021 12:00 AM     Problem: Medication management and Monitoring and Coordination of Care for Chronic Conditions   Priority: High  Onset Date: 09/02/2020     Long-Range Goal: Medication management   Start Date: 09/02/2020  Recent Progress: On track  Priority: High  Note:     Current Barriers:  Unable to achieve control of hyperlipidemia  Does not adhere to prescribed medication regimen Does not maintain contact with provider office  Pharmacist Clinical Goal(s):  Over the next 90 days, patient will achieve adherence to monitoring guidelines and medication adherence to achieve therapeutic efficacy achieve control of hyperlipidemia as evidenced by LDL < 70 adhere to prescribed medication regimen as evidenced by filling prescriptions on times contact provider office for questions/concerns as evidenced notation of same in electronic health record through collaboration with PharmD and provider.   Interventions: 1:1 collaboration with Debbrah Alar, NP regarding development and update  of comprehensive plan of care as evidenced by provider attestation and co-signature Inter-disciplinary care team collaboration (see longitudinal plan of care) Comprehensive medication review performed; medication list updated in electronic medical record  Hypertension: Controlled; BP goal is:  <130/80  Patient is not checking BP at home - states she does not have BP cuff.  Patient has failed these meds in the past: None noted  Current Regimen: Metoprolol Succinate 25mg  0.5 tab daily Lisinopril 2.5mg  daily Interventions:  Reviewed refill records and assessed for adherence issues. Discussed importance of taking metoprolol and lisinopril for heart health Reviewed signs and symptoms of low blood pressure  to monitor for.  Discussed blood pressure  goal Recommended get blood pressure  cuff and check blood pressure  2 to 3 times per week.  Continue current medications    Hyperlipidemia / history of MI: Not controlled; LDL goal < 70  Patient has failed these meds in past: None noted   Patient states she just stopped taking atorvastatin because she was taking so many other medications Current therapy Atorvastatin 80mg  daily (increased 08/18/2020) Ezetimibe 10mg  daily (added 09/17/2020)  Metoprolol Succinate 25mg  1/2 tab daily Aspirin 81mg  daily  Interventions:  Discussed LDL goal and recent LDL results Discussed importance of taking atorvastatin daily to both lower cholesterol and prevent ASCVD / recurrent MI   Chronic Obstructive Pulmonary Disease: Controlled Current treatment: Albuterol solution for nebulizer - use in nebulizer up to every 6 hours as needed for shortness of breath No exacerbations requiring treatment in the last 6 months  Interventions: None  Depression/Anxiety:  Controlled Patient reports she is feeling so much better since back pain has improved. It has also helped with her mood. Patient has failed these meds in past: citalopram (ineffective) Current  treatment: Escitalopram 20mg  daily Diazepam 10mg  every 12 hours as needed  Interventions:  Discussed adherence and importance of taking escitalopram for depression / anxiety Continue current therapy  Hypothyroidism:  TSH was WNL at last check on 08/18/2020 Patient has failed these meds in past: None noted  Patient is currently controlled on the following medications:  levothyroxine 76mcg daily  Intervention:  Continue current therapy   Gout:   Goal Uric Acid < 6 mg/dL   Last uric acid was not at goal (UA = 8.4 on 03/14/2020) but patient denies symptoms of gout at this time.  Current medications:  Allopurinol 100mg  - take 2 tablets = 200mg  daily Colchicine 0.6mg  up to bid as needed for acute gout Medications that may increase uric acid levels: Aspirin Last gout flare: Feb 2022 Patient has failed these meds in past: None noted   Interventions:  Reviewed preventative versus treatment medications for gout.  Continue current therapy Consider checking Uric acid with next labs.    Medication management:   Patient was using scissors to cut her pills. Provided her with a pill cutter after last appointment (was mailed to patient at her home) (completed at previous visit) Evaluated patient's current medication administration process.  Patient has taken all medication out of the prescription bottle and placed in weekly pill container however she did not fill the container by the day, instead she had all her metoprolol in Monday's slot; all her levothyroxine in Tuesday's slot, all her pantoprazole in Wednesday's slot, etc. She has 2 weekly pill containers like this but could not identify which pills were in each slot. She states she would just take 1 tablet every morning from each slot.  She did appear to have all prescribed meds with the exception of sucralfate and dicyclomine like this. She states she did not have these 2 in pill containers because she takes more than once per day.  Current  pharmacy:  [facility] and  [facility] Intervention:  Assisted patient in placing a few tablets back in prescription bottle so that she would be able to identify tablets in the pill boxes Packaging has been arranged after speaking with [facility] start day was not until 01/16/2021. Since she only has one tablet of lisinopril left, asked if date can be moved up to 01/10/2021 or more lisinopril delivered to her until start date 01/16/21. [facility] is working on sooner start date for packaging.  Reviewed each medication and reason for taking. Noted reason for each medication on patient's medication list.    Patient Goals/Self-Care Activities Over the next 90 days, patient will:  take medications as prescribed, focus on medication adherence by filing prescriptions at one pharmacy, and call if any issues with medications - especially with splitting tablets or swallowing tablets.   Follow Up Plan:  will check back in with patient in 7 days once packaging arrives to make sure she does not have any questions.                       Medication Assistance: None required.  Patient affirms current coverage meets needs.  Patient's preferred pharmacy is:  [place], [place] [address] Phone: [phone] Fax: [phone]  [facility] - [place], [place] [address] [place] [address] Phone: [phone] Fax: [phone]  Follow Up:  Patient agrees to Care Plan and Follow-up.  Plan: follow up in 7 days to review packaging and make sure patient has not questions.   Cherre Robins, PharmD Clinical Pharmacist [facility] [phone]

## 2021-01-09 NOTE — Progress Notes (Signed)
Subjective:   By signing my name below, I, Veronica Wolf, attest that this documentation has been prepared under the direction and in the presence of Debbrah Alar, NP, 01/09/2021   Patient ID: Veronica Wolf, female    DOB: Jun 28, 1940, 80 y.o.   MRN: [id number]  Chief Complaint  Patient presents with   Abdominal Pain    Patient complains of epigastric pain, this started 2 days ago   Diarrhea    Complains of some diarrhea for 2 days    HPI Patient is in today for an office visit.   abdominal pain: She is experiencing pain in her epigastric area. Her pain began two days ago. She has experienced this pain before and notes that it is very similar to her previous pain. She has also seen GI for this pain and had EGD which noted stricture and HH. She has been using ginger ale to help with this pain.  Diarrhea: She notes about 3 loose stools that began yesterday.  Decreased appetite: She is experiencing a decrease in appetite since the onset of her symptoms.  Hiatal hernia: She does have a history of a hiatal hernia.  Blood pressure: Her blood pressure is slightly elevated today.  BP Readings from Last 3 Encounters:  01/09/21 (!) 143/93  12/03/20 (!) 110/55  09/17/20 100/60    Medications: She is compliant in taking her 40 mg Protonix twice a day and her 20 mg pepcid twice a day and Carafate QID.  Health Maintenance Due  Topic Date Due   Zoster Vaccines- Shingrix (1 of 2) Never done   COVID-19 Vaccine (3 - Pfizer risk series) 07/06/2019    Past Medical History:  Diagnosis Date   Allergic rhinitis    Allergy    seasonal allergies   Anxiety    CKD (chronic kidney disease) 04/04/2017   COPD (chronic obstructive pulmonary disease) (HCC)    uses inhaler   Depression    Fatty liver    GERD (gastroesophageal reflux disease)    Gout    hx of   Hyperlipidemia    on meds   Hypertension    on meds   Myocardial infarction [facility])    Peptic ulcer 01/04/2003   Vertigo      Past Surgical History:  Procedure Laterality Date   ABDOMINAL HYSTERECTOMY     APPENDECTOMY     BIOPSY  08/13/2020   Procedure: BIOPSY;  Surgeon: Lavena Bullion, DO;  Location: [facility];  Service: Gastroenterology;;   ESOPHAGOGASTRODUODENOSCOPY (EGD) WITH PROPOFOL N/A 08/13/2020   Procedure: ESOPHAGOGASTRODUODENOSCOPY (EGD) WITH PROPOFOL;  Surgeon: Lavena Bullion, DO;  Location: [facility];  Service: Gastroenterology;  Laterality: N/A;   LEFT HEART CATH AND CORONARY ANGIOGRAPHY N/A 06/13/2017   Procedure: LEFT HEART CATH AND CORONARY ANGIOGRAPHY;  Surgeon: Leonie Man, MD;  Location: [place] [facility];  Service: Cardiovascular;  Laterality: N/A;   RIGHT/LEFT HEART CATH AND CORONARY ANGIOGRAPHY N/A 09/16/2017   Procedure: RIGHT/LEFT HEART CATH AND CORONARY ANGIOGRAPHY;  Surgeon: Martinique, Peter M, MD;  Location: [place] [facility];  Service: Cardiovascular;  Laterality: N/A;   SAVORY DILATION N/A 08/13/2020   Procedure: SAVORY DILATION;  Surgeon: Lavena Bullion, DO;  Location: [facility];  Service: Gastroenterology;  Laterality: N/A;   ULTRASOUND GUIDANCE FOR VASCULAR ACCESS  09/16/2017   Procedure: Ultrasound Guidance For Vascular Access;  Surgeon: Martinique, Peter M, MD;  Location: [place] [facility];  Service: Cardiovascular;;   WISDOM TOOTH EXTRACTION  Family History  Problem Relation Age of Onset   Breast cancer Mother    Stroke Father    Stomach cancer Neg Hx    Colon cancer Neg Hx    Pancreatic cancer Neg Hx    Esophageal cancer Neg Hx    Colon polyps Neg Hx    Rectal cancer Neg Hx     Social History   Socioeconomic History   Marital status: Widowed    Spouse name: Not on file   Number of children: 1   Years of education: Not on file   Highest education level: Not on file  Occupational History   Occupation: Retired  Tobacco Use   Smoking status: Former    Types: Cigarettes    Quit date: 04/24/2000    Years since quitting: 20.7   Smokeless  tobacco: Never  Vaping Use   Vaping Use: Never used  Substance and Sexual Activity   Alcohol use: Not Currently    Comment: has previous hx of ETOH abuse, quit 2014   Drug use: No   Sexual activity: Never  Other Topics Concern   Not on file  Social History Narrative   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5 MG/3ML) 0.083% nebulizer solution USE 1 VIAL IN NEBULIZER EVERY 6 HOURS AS NEEDED FOR WHEEZING AND FOR SHORTNESS OF BREATH 150 mL 0   allopurinol (ZYLOPRIM) 100 MG tablet Take 2 tablets (200 mg total) by mouth daily. 180 tablet 1   aspirin 81 MG EC tablet Take 1 tablet (81 mg total) by mouth daily. Swallow whole. 30 tablet 12   atorvastatin (LIPITOR) 80 MG tablet Take 1 tablet (80 mg total) by mouth daily. 90 tablet 3   colchicine 0.6 MG tablet Take 0.6 mg by mouth daily as needed (gout).     diazepam (VALIUM) 10 MG tablet TAKE ONE TABLET BY MOUTH EVERY TWELVE HOURS AS NEEDED FOR ANXIETY 45 tablet 0   dicyclomine (BENTYL) 10 MG capsule Take 1 capsule (10 mg total) by mouth in the morning, at noon, in the evening, and at bedtime. 360 capsule 1   escitalopram (LEXAPRO) 20 MG tablet Take 1 tablet (20 mg total) by mouth daily. 90 tablet 1   ezetimibe (ZETIA) 10 MG tablet Take 1 tablet (10 mg total) by mouth daily. 90 tablet 3   famotidine (PEPCID) 20 MG  tablet Take 1 tablet (20 mg total) by mouth 2 (two) times daily. 60 tablet 3   isosorbide mononitrate (IMDUR) 30 MG 24 hr tablet Take 0.5 tablets (15 mg total) by mouth daily. 45 tablet 3   levothyroxine (EUTHYROX) 25 MCG tablet TAKE 1 TABLET BY MOUTH ONCE DAILY BEFORE BREAKFAST 90 tablet 1   lisinopril (ZESTRIL) 2.5 MG tablet Take 1 tablet (2.5 mg total) by mouth daily. 90 tablet 3  meclizine (ANTIVERT) 25 MG tablet TAKE ONE TABLET BY MOUTH TWICE DAILY AS NEEDED FOR dizziness 30 tablet 1   metoprolol succinate (TOPROL-XL) 25 MG 24 hr tablet Take 0.5 tablets (12.5 mg total) by mouth daily. 45 tablet 3   nitroGLYCERIN (NITROSTAT) 0.4 MG SL tablet Place 1 tablet (0.4 mg total) under the tongue every 5 (five) minutes as needed for chest pain. 45 tablet 3   nystatin (NYSTATIN) powder APPLY  POWDER TOPICALLY TO AFFECTED AREA 2 TIMES DAILY (Patient taking differently: APPLY  POWDER TOPICALLY TO AFFECTED AREA 2 TIMES DAILY) 60 g 1   pantoprazole (PROTONIX) 40 MG tablet Take 1 tablet (40 mg total) by mouth 2 (two) times daily. 180 tablet 0   potassium chloride SA (KLOR-CON) 20 MEQ tablet Take 1 tablet (20 mEq total) by mouth daily. 90 tablet 1   sucralfate (CARAFATE) 1 g tablet TAKE ONE TABLET BY MOUTH FOUR TIMES DAILY. MAY cut pill in half AND DISSOLVE in water prior TO drinking 120 tablet 0   No facility-administered medications prior to visit.    Allergies  Allergen Reactions   Ibuprofen Other (See Comments)    Pt has hx of peptic ulcer and diverticulitis.     Review of Systems  Gastrointestinal:  Positive for abdominal pain (In middle upper quadrant) and diarrhea.      Objective:    Physical Exam Constitutional:      General: She is not in acute distress.    Appearance: Normal appearance. She is not ill-appearing.  HENT:     Head: Normocephalic and atraumatic.     Right Ear: External ear normal.     Left Ear: External ear normal.  Eyes:     Extraocular Movements: Extraocular  movements intact.     Pupils: Pupils are equal, round, and reactive to light.  Cardiovascular:     Rate and Rhythm: Normal rate and regular rhythm.     Heart sounds: Normal heart sounds. No murmur heard.   No gallop.  Pulmonary:     Effort: Pulmonary effort is normal. No respiratory distress.     Breath sounds: Normal breath sounds. No wheezing or rales.  Abdominal:     General: Bowel sounds are normal.     Tenderness: There is abdominal tenderness (mild tenderness in LUQ).  Lymphadenopathy:     Cervical: No cervical adenopathy.  Skin:    General: Skin is warm and dry.  Neurological:     Mental Status: She is alert and oriented to person, place, and time.  Psychiatric:        Behavior: Behavior normal.        Judgment: Judgment normal.    BP (!) 143/93 (BP Location: Right Arm, Patient Position: Sitting, Cuff Size: Small)   Pulse 88   Temp 98.1 F (36.7 C) (Oral)   Resp 16   Wt 189 lb (85.7 kg)   SpO2 97%   BMI 32.44 kg/m  Wt Readings from Last 3 Encounters:  01/09/21 189 lb (85.7 kg)  12/03/20 188 lb (85.3 kg)  09/17/20 205 lb (93 kg)       Assessment & Plan:   Problem List Items Addressed This Visit       Unprioritized   Epigastric pain - Primary    Acute on chronic.  She as had extensive GI work up for this including endoscopy. She does have a hiatal hernia and I wonder how much this may be contributing to her discomfort. We discussed eating smaller meals. Clinical  Pharmacist is working with her and the pharmacy on medication packaging which will hopefully help with compliance.  Continue protonix bid, pepcid bid and carafate QID.  Will also obtain abdominal US for further evaluation of GB.  She is advised to go to the ER if severe/worsening pain or if unable to eat/drink.    In regards to the diarrhea- I advised pt to let me know if symptoms worsen or if not resolved in 2-3 days. We discussed adequate hydration.       Relevant Orders   Hemoglobin A1c   Comp Met  (CMET)   Lipase   US Abdomen Complete   No orders of the defined types were placed in this encounter.   I, Debbrah Alar, NP, personally preformed the services described in this documentation.  All medical record entries made by the scribe were at my direction and in my presence.  I have reviewed the chart and discharge instructions (if applicable) and agree that the record reflects my personal performance and is accurate and complete. 01/09/2021  I,Veronica Wolf,acting as a Education administrator for Nance Pear, NP.,have documented all relevant documentation on the behalf of Nance Pear, NP,as directed by  Nance Pear, NP while in the presence of Nance Pear, NP.  Nance Pear, NP

## 2021-01-09 NOTE — Patient Instructions (Signed)
Visit Information  PATIENT GOALS:  Goals Addressed             This Visit's Progress    Chronic Care Management Pharmacy Care Plan       CARE PLAN ENTRY (see longitudinal plan of care for additional care plan information)  Current Barriers:  Chronic Disease Management support, education, and care coordination needs related to Hyperlipidemia/Hx of MI, Pre-Diabetes, Hypothyroidism, Depression/Anxiety, GERD, Gout    Hypertension Screening BP Readings from Last 3 Encounters:  01/09/21 (!) 143/93  12/03/20 (!) 110/55  09/17/20 100/60  Pharmacist Clinical Goal(s): Over the next 90 days, patient will work with PharmD and providers to maintain BP goal <130/80 Current regimen:  Metoprolol Succinate 25mg  0.5 tab daily Lisinopril 2.5mg  daily Interventions: Discussed blood pressure goal Consider purchasing blood pressure cuff for home use and check blood pressure 2 to 3 times per week and record Reviewed refill records and assessed for adherence issues. Discussed importance of taking metoprolol and lisinopril for heart health Patient self care activities - Over the next 90 days, patient will: Maintain blood pressure less than 130/80 Consider purchasing blood pressure cuff and check blood pressure 2 to 3 times per week and record Continue current medications    Hyperlipidemia / Heart health Lab Results  Component Value Date/Time   LDLCALC 125 (H) 08/18/2020 12:03 PM   LDLCALC 108 (H) 03/14/2020 02:39 PM  Pharmacist Clinical Goal(s): Over the next 90 days, patient will work with PharmD and providers to achieve LDL goal < 70 Current regimen:  Atorvastatin 80mg  daily  Ezetimibe 10mg  daily Aspirin 81mg  daily  Metoprolol succinate ER 25mg  - take 0.5 tablet daily Interventions: Discussed LDL goal and importance of medication adherence Repeat LDL at next office visit since ezetimibe started Discussed adherence Patient self care activities - Over the next 90 days, patient  will: Continue medications current prescribed to lower cholesterol and to prevent heart attack Coordinated refill for metoprolol with patient's pharmacy  Anxiety / Depression:  Pharmacist Clinical Goal(s): Over the next 45 days, patient will work with PharmD and providers to maintain control of anxiety and depression symptoms Current regimen:  Escitalopram 20mg  daily  Diazepam 10mg  take 1 tablet every 12 hours if needed for anxiety Interventions: Addressed at previous visit Patient self care activities - Over the next 90 days, patient will: Maintain current medication regimen for anxiety and depression   Gout:  Pharmacist Clinical Goal(s): Over the next 90 days, patient will work with PharmD and providers to prevent acute gout episodes / lower uric acid Current regimen:  Allopurinol 100mg  - take 2 tablets = 200mg  daily (prevention) Colchicine 0.6mg  take up to twice a day as needed for gout attack Interventions: Discussed difference between maintenance / presentation medication and treatment Patient self care activities - Over the next 90 days, patient will: Maintain current medication regimen for gout  Pre-Diabetes Lab Results  Component Value Date/Time   HGBA1C 6.2 (H) 09/17/2020 10:38 AM   HGBA1C 6.1 06/12/2018 01:58 PM  Pharmacist Clinical Goal(s): Over the next 90 days, patient will work with PharmD and providers to maintain A1c goal <6.5% Current regimen:  Diet and exercise management   Interventions: Discussed the importance of limiting carbohydrates (30-45 grams per meal) Patient self care activities - Over the next 90 days, patient will: Maintain a1c <6.5%  Hypothyroidism Pharmacist Clinical Goal(s) Over the next 90 days, patient will work with PharmD and providers to maintain TSH within normal limits and reduce risk of symptoms associated with hypothyroidism  Current regimen:  Euthyrox / levothyroxine daily Interventions: Discussed importance of medication  adherence Reminded to separate Euthyrox / levothyroxine dose by 30 minutes from breakfast and morning medications Patient self care activities - Over the next 90 days, patient will: Maintain hypothyroidism medication regimen  Medication management Pharmacist Clinical Goal(s): Over the next 90 days, patient will work with PharmD and providers to achieve optimal medication adherence Current pharmacy: [facility] switched to [facility] Interventions Comprehensive medication review performed. Utilize [facility] pharmacy for medication synchronization, packaging and delivery Assisted patient in identifying medications  Coordinated converting patient to using packaging with [facility] Reviewed each medication and reason for taking. Noted reason for each medication on patient's medication list.   Patient self care activities - Over the next 90 days, patient will: Focus on medication adherence by filling and taking medications appropriately  Take medications as prescribed Purchase a pill cutter at the pharmacy.  Report any questions or concerns to PharmD and/or provider(s) Contact Asanti Craigo, PharmD if any issues with cost of medications or difficulty swallowing tablets.   Please see past updates related to this goal by clicking on the "Past Updates" button in the selected goal          The patient verbalized understanding of instructions, educational materials, and care plan provided today and declined offer to receive copy of patient instructions, educational materials, and care plan.   Telephone follow up appointment with care management team member scheduled for: 7 days  [name], PharmD Clinical Pharmacist [facility]

## 2021-01-09 NOTE — Assessment & Plan Note (Addendum)
Acute on chronic.  She as had extensive GI work up for this including endoscopy. She does have a hiatal hernia and I wonder how much this may be contributing to her discomfort. We discussed eating smaller meals. Clinical Pharmacist is working with her and the pharmacy on medication packaging which will hopefully help with compliance.  Continue protonix bid, pepcid bid and carafate QID.  Will also obtain abdominal US for further evaluation of GB.  She is advised to go to the ER if severe/worsening pain or if unable to eat/drink.    In regards to the diarrhea- I advised pt to let me know if symptoms worsen or if not resolved in 2-3 days. We discussed adequate hydration.

## 2021-01-09 NOTE — Patient Instructions (Signed)
Please complete lab work prior to leaving. You should be contacted about scheduling your appointment for the ultrasound of your stomach.

## 2021-01-12 ENCOUNTER — Other Ambulatory Visit: Payer: Self-pay | Admitting: Family

## 2021-01-12 ENCOUNTER — Telehealth: Payer: Self-pay | Admitting: Family

## 2021-01-12 NOTE — Telephone Encounter (Signed)
Medication: diazepam (VALIUM) 10 MG tablet   Has the patient contacted their pharmacy? Yes.   (If no, request that the patient contact the pharmacy for the refill.) (If yes, when and what did the pharmacy advise?)  Preferred Pharmacy (with phone number or street name): [facility] - [place], [place] - [place] [address]  [address], [place] [address]  Phone:  [phone]    Agent: Please be advised that RX refills may take up to 3 business days. We ask that you follow-up with your pharmacy.

## 2021-01-12 NOTE — Telephone Encounter (Signed)
Received call from technician with [facility]. Let them know that request for diazepam has been received.

## 2021-01-12 NOTE — Progress Notes (Signed)
Mailed out to patient 

## 2021-01-12 NOTE — Telephone Encounter (Signed)
Requesting: diazepam 10mg   Contract: 03/14/2020 UDS: 03/14/2020 Last Visit: 01/09/2021 Next Visit: None Last Refill: 12/05/2020 #45 and 0RF  Please Advise

## 2021-01-14 ENCOUNTER — Ambulatory Visit: Payer: Medicare HMO | Admitting: Family

## 2021-01-21 ENCOUNTER — Telehealth: Payer: Self-pay | Admitting: Pharmacist

## 2021-01-21 ENCOUNTER — Ambulatory Visit (HOSPITAL_BASED_OUTPATIENT_CLINIC_OR_DEPARTMENT_OTHER): Admission: RE | Admit: 2021-01-21 | Payer: Medicare HMO | Source: Ambulatory Visit

## 2021-01-21 NOTE — Telephone Encounter (Signed)
Patient called stating that she is not sure she can make her appointment for ultrasound for tomorrow 01/22/2021 at 4:30. I tried to call imaging for her to reschedule but has to LM on VM.  Patient was provided with phone number for imaging at [facility] [phone] and will call them tomorrow morning if she has not received a return call sooner.

## 2021-01-22 ENCOUNTER — Telehealth (HOSPITAL_BASED_OUTPATIENT_CLINIC_OR_DEPARTMENT_OTHER): Payer: Self-pay

## 2021-01-22 ENCOUNTER — Ambulatory Visit (HOSPITAL_BASED_OUTPATIENT_CLINIC_OR_DEPARTMENT_OTHER): Payer: Medicare HMO

## 2021-01-23 ENCOUNTER — Ambulatory Visit (HOSPITAL_BASED_OUTPATIENT_CLINIC_OR_DEPARTMENT_OTHER): Payer: Medicare HMO

## 2021-01-27 ENCOUNTER — Other Ambulatory Visit: Payer: Self-pay | Admitting: Family

## 2021-01-29 ENCOUNTER — Ambulatory Visit (HOSPITAL_BASED_OUTPATIENT_CLINIC_OR_DEPARTMENT_OTHER)
Admission: RE | Admit: 2021-01-29 | Discharge: 2021-01-29 | Disposition: A | Discharge status: Home / Self Care | Payer: Medicare HMO | Source: Ambulatory Visit | Attending: Family | Admitting: Family

## 2021-01-29 ENCOUNTER — Other Ambulatory Visit: Payer: Self-pay | Admitting: Family

## 2021-01-29 ENCOUNTER — Other Ambulatory Visit: Payer: Self-pay

## 2021-01-29 DIAGNOSIS — R1013 Epigastric pain: Secondary | ICD-10-CM | POA: Insufficient documentation

## 2021-01-30 ENCOUNTER — Ambulatory Visit: Payer: Medicare HMO | Admitting: Pharmacist

## 2021-01-30 ENCOUNTER — Other Ambulatory Visit: Payer: Self-pay | Admitting: Family

## 2021-01-30 DIAGNOSIS — E785 Hyperlipidemia, unspecified: Secondary | ICD-10-CM

## 2021-01-30 DIAGNOSIS — Z79899 Other long term (current) drug therapy: Secondary | ICD-10-CM

## 2021-01-30 DIAGNOSIS — F419 Anxiety disorder, unspecified: Secondary | ICD-10-CM

## 2021-01-30 DIAGNOSIS — R1013 Epigastric pain: Secondary | ICD-10-CM

## 2021-01-30 DIAGNOSIS — I2510 Atherosclerotic heart disease of native coronary artery without angina pectoris: Secondary | ICD-10-CM

## 2021-02-02 ENCOUNTER — Telehealth: Payer: Self-pay | Admitting: Family

## 2021-02-02 ENCOUNTER — Other Ambulatory Visit: Payer: Self-pay | Admitting: Family

## 2021-02-02 DIAGNOSIS — I2510 Atherosclerotic heart disease of native coronary artery without angina pectoris: Secondary | ICD-10-CM | POA: Diagnosis not present

## 2021-02-02 DIAGNOSIS — E785 Hyperlipidemia, unspecified: Secondary | ICD-10-CM

## 2021-02-02 NOTE — Telephone Encounter (Signed)
FYI Patient was asking about chest pain she had in the past and "still having at times"  Patient was advised to call cardiology. They tried to bring her back for appointment and called her several times. She will call them tomorrow. Phone number was provided.

## 2021-02-02 NOTE — Patient Instructions (Signed)
Visit Information  PATIENT GOALS:  Goals Addressed             This Visit's Progress    Chronic Care Management Pharmacy Care Plan   On track    CARE PLAN ENTRY (see longitudinal plan of care for additional care plan information)  Current Barriers:  Chronic Disease Management support, education, and care coordination needs related to Hyperlipidemia/Hx of MI, Pre-Diabetes, Hypothyroidism, Depression/Anxiety, GERD, Gout    Hypertension Screening BP Readings from Last 3 Encounters:  01/09/21 (!) 143/93  12/03/20 (!) 110/55  09/17/20 100/60  Pharmacist Clinical Goal(s): Over the next 90 days, patient will work with PharmD and providers to achieve BP goal <130/80 Current regimen:  Metoprolol Succinate 25mg  0.5 tab daily Lisinopril 2.5mg  daily Interventions: Discussed blood pressure goal Consider purchasing blood pressure cuff for home use and check blood pressure 2 to 3 times per week and record Reviewed refill records and assessed for adherence issues. Discussed importance of taking metoprolol and lisinopril for heart health Patient self care activities - Over the next 90 days, patient will: Maintain blood pressure less than 130/80 Consider purchasing blood pressure cuff and check blood pressure 2 to 3 times per week and record Continue current medications    Hyperlipidemia / Heart health Lab Results  Component Value Date/Time   LDLCALC 125 (H) 08/18/2020 12:03 PM   LDLCALC 108 (H) 03/14/2020 02:39 PM  Pharmacist Clinical Goal(s): Over the next 90 days, patient will work with PharmD and providers to achieve LDL goal < 70 Current regimen:  Atorvastatin 80mg  daily  Ezetimibe 10mg  daily Aspirin 81mg  daily  Metoprolol succinate ER 25mg  - take 0.5 tablet daily Nitroglycerin 0.4mg  - place 1 tablet under the tongue as needed for chest pain, may repeat in 5 minutes if chest pain not relieved.  Interventions: Discussed LDL goal and importance of medication adherence Repeat LDL  at next office visit since ezetimibe started Discussed adherence Patient self care activities - Over the next 90 days, patient will: Continue medications currently prescribed to lower cholesterol and to prevent heart attack Rescheduled appointment with cardiologist  Anxiety / Depression:  Pharmacist Clinical Goal(s): Over the next 45 days, patient will work with PharmD and providers to maintain control of anxiety and depression symptoms Current regimen:  Escitalopram 20mg  daily  Diazepam 10mg  take 1 tablet every 12 hours if needed for anxiety Interventions: Addressed at previous visit Patient self care activities - Over the next 90 days, patient will: Maintain current medication regimen for anxiety and depression  Remember to take diazepam only as needed.   Gout:  Pharmacist Clinical Goal(s): Over the next 90 days, patient will work with PharmD and providers to prevent acute gout episodes / lower uric acid Current regimen:  Allopurinol 100mg  - take 2 tablets = 200mg  daily (prevention) Colchicine 0.6mg  take up to twice a day as needed for gout attack Interventions: Discussed difference between maintenance / presentation medication and treatment Patient self care activities - Over the next 90 days, patient will: Maintain current medication regimen for gout  Pre-Diabetes Lab Results  Component Value Date/Time   HGBA1C 6.5 01/09/2021 08:10 AM   HGBA1C 6.2 (H) 09/17/2020 10:38 AM  Pharmacist Clinical Goal(s): Over the next 90 days, patient will work with PharmD and providers to maintain A1c goal <6.5% Current regimen:  Diet and exercise management   Interventions: Discussed the importance of limiting carbohydrates (30-45 grams per meal) Patient self care activities - Over the next 90 days, patient will: Maintain a1c <6.5%  Hypothyroidism Pharmacist Clinical Goal(s) Over the next 90 days, patient will work with PharmD and providers to maintain TSH within normal limits and  reduce risk of symptoms associated with hypothyroidism Current regimen:  Euthyrox / levothyroxine daily Interventions: Discussed importance of medication adherence Reminded to separate Euthyrox / levothyroxine dose by 30 minutes from breakfast and morning medications Patient self care activities - Over the next 90 days, patient will: Maintain hypothyroidism medication regimen  Medication management Pharmacist Clinical Goal(s): Over the next 90 days, patient will work with PharmD and providers to achieve optimal medication adherence Current pharmacy: [facility] switched to [facility] Interventions Comprehensive medication review performed. Utilize [facility] pharmacy for medication synchronization, packaging and delivery Assisted patient in identifying medications  Coordinated next delivery with [facility] Reviewed each medication and reason for taking.  Remember to secure all medications in your home from potential diversion or accidental ingestion.  Patient self care activities - Over the next 90 days, patient will: Focus on medication adherence by filling and taking medications appropriately  Take medications as prescribed Purchase a pill cutter at the pharmacy.  Report any questions or concerns to PharmD and/or provider(s) Contact Eleni Frank, PharmD if any issues with cost of medications or difficulty swallowing tablets.   Please see past updates related to this goal by clicking on the "Past Updates" button in the selected goal          The patient verbalized understanding of instructions, educational materials, and care plan provided today and declined offer to receive copy of patient instructions, educational materials, and care plan.   Telephone follow up appointment with care management team member scheduled for: 14 to 21 days  [name], PharmD Clinical Pharmacist [facility]

## 2021-02-02 NOTE — Chronic Care Management (AMB) (Signed)
Chronic Care Management Pharmacy Note  02/02/2021 Name:  Veronica Wolf MRN:  [id number] DOB:  29-Aug-1940  Summary: Has received first month of adherence packaging and like a lot! Improved adherence.  She does mention needing refill for diazepam - has 6 tablets left but last filled 01/12/2021 or 23 days supply.   Recommendations/Changes made from today's visit: Coordinated with pharmacy to next adherence packaging refill and delivery Discussed how often she is using diazepam. She states not taking bid every day but will run out soon. Advised her to secure medication at home and to monitor her intake more closely - should try to use only if needed.   Subjective: Veronica Wolf is an 80 y.o. year old female who is a primary patient of Debbrah Alar, NP.  The CCM team was consulted for assistance with disease management and care coordination needs.    Engaged with patient by telephone for follow up visit in response to provider referral for pharmacy case management and/or care coordination services.   Consent to Services:  The patient was given information about Chronic Care Management services, agreed to services, and gave verbal consent prior to initiation of services.  Please see initial visit note for detailed documentation.   Patient Care Team: Debbrah Alar, NP as PCP - General (Internal Medicine) Revankar, Reita Cliche, MD as PCP - Cardiology (Cardiology) Michiel Cowboy, RN as [facility] Cherre Robins, PharmD (Pharmacist)  Recent office visits: 01/09/2021 - PCP Inda Castle, NP) Seen for epigatric / chest pain. Labs ordered - CBC, BMP and lipase. Also ordered CT abdomin. No med changes noted.  12/03/2020- PCP Inda Castle, NP) Seen for back pain. Prescribed Medrol dose pack and ordered Xay. Also checked urinalysis and recommended she complete course of ciprofloxacin given while she was in [place].  09/16/2020 - PCP Phone Call. Ultrasound showed  fatty liver. Recommended follow up with GI.  09/08/2020 - PCP Inda Castle) Seen for abdominal pain and diarrhea. Hemoccult negative. Reordered Abdominal CT, LFTs and lipase, CBC. No medication changes 08/18/2020 - PCP Inda Castle) - follow up chronic conditions. Atorvastatin increased to 69m daily after LDL checked and found to be 125; Also added aspirin 86mdaily due to h/o MI.  Recent consult visits: 09/17/2020 - Cardio (Dr ToHarriet MassonCAD / intermittant chest pain. Started low dose Imdue 1563maily and ezetimibe 43m33mily. Ordered nucular stress test  [facility] see below [facility]  Lab Results  Component Value Date   CREATININE 1.40 (H) 01/09/2021   CREATININE 1.38 (H) 08/18/2020   CREATININE 1.23 (H) 06/11/2020    Lab Results  Component Value Date   HGBA1C 6.5 01/09/2021   Last diabetic Eye exam: No results found for: HMDIABEYEEXA  Last diabetic Foot exam: No results found for: HMDIABFOOTEX      Component Value Date/Time   CHOL 204 (H) 08/18/2020 1203   TRIG 182.0 (H) 08/18/2020 1203   HDL 42.30 08/18/2020 1203   CHOLHDL 5 08/18/2020 1203   VLDL 36.4 08/18/2020 1203   LDLCALC 125 (H) 08/18/2020 1203   LDLCALC 108 (H) 03/14/2020 1439    Hepatic Function Latest Ref Rng & Units 01/09/2021 09/08/2020 08/18/2020  Total Protein 6.0 - 8.3 g/dL 6.7 6.3 6.6  Albumin 3.5 - 5.2 g/dL 3.8 3.6 3.8  AST 0 - 37 U/L _0 ALT 0 - 35 U/L  6 5 7  Alk Phosphatase 39 - 117 U/L 82 64 80  Total Bilirubin 0.2 - 1.2 mg/dL 0.3 0.4 0.2  Bilirubin, Direct 0.0 - 0.3 mg/dL - 0.1 -    Lab Results  Component Value Date/Time   TSH 3.57 08/18/2020 12:03 PM   TSH 1.77 03/14/2020 02:39 PM   FREET4 0.85 01/24/2018 03:04 PM    CBC Latest Ref Rng & Units 09/08/2020 06/11/2020 01/23/2018  WBC 4.0 - 10.5 K/uL 7.3 9.1 8.3  Hemoglobin 12.0 - 15.0 g/dL 11.6(L) 11.5(L) 11.9(L)  Hematocrit 36.0 - 46.0 % 36.1  38.1 36.6  Platelets 150.0 - 400.0 K/uL 219.0 219 190.0    No results found for: VD25OH  Clinical ASCVD: Yes  The ASCVD Risk score (Arnett DK, et al., 2019) failed to calculate for the following reasons:   The 2019 ASCVD risk score is only valid for ages 40 to 79   The patient has a prior MI or stroke diagnosis    Other:  DEXA 03/05/2014             AP LUMBAR SPINE not used for calculation due to significant degenerative changes.             Left FOREARM (1/3 RADIUS) T Score: -0.4             Left FEMUR neck T-Score: -0.7     Social History   Tobacco Use  Smoking Status Former   Types: Cigarettes   Quit date: 04/24/2000   Years since quitting: 20.7  Smokeless Tobacco Never   BP Readings from Last 3 Encounters:  01/09/21 (!) 143/93  12/03/20 (!) 110/55  09/17/20 100/60   Pulse Readings from Last 3 Encounters:  01/09/21 88  12/03/20 87  09/17/20 87   Wt Readings from Last 3 Encounters:  01/09/21 189 lb (85.7 kg)  12/03/20 188 lb (85.3 kg)  09/17/20 205 lb (93 kg)    Assessment: Review of patient past medical history, allergies, medications, health status, including review of consultants reports, laboratory and other test data, was performed as part of comprehensive evaluation and provision of chronic care management services.   SDOH:  (Social Determinants of Health) assessments and interventions performed:  SDOH Interventions    Flowsheet Row Most Recent Value  SDOH Interventions   Financial Strain Interventions Intervention Not Indicated       CCM Care Plan  Allergies  Allergen Reactions   Ibuprofen Other (See Comments)    Pt has hx of peptic ulcer and diverticulitis.     Medications Reviewed Today     Reviewed by Eckard, Tammy, PharmD (Pharmacist) on 01/30/21 at 1106  Med List Status: <None>   Medication Order Taking? Sig Documenting Provider Last Dose Status Informant  albuterol (PROVENTIL) (2.5 MG/3ML) 0.083% nebulizer solution [id number] No USE 1  VIAL IN NEBULIZER EVERY 6 HOURS AS NEEDED FOR WHEEZING AND FOR SHORTNESS OF BREATH  Patient not taking: Reported on 01/30/2021   O'Sullivan, Melissa, NP Not Taking Active   allopurinol (ZYLOPRIM) 100 MG tablet [id number] Yes Take 2 tablets (200 mg total) by mouth daily. O'Sullivan, Melissa, NP Taking Active   aspirin 81 MG EC tablet [id number] Yes Take 1 tablet (81 mg total) by mouth daily. Swallow whole. Tobb, Kardie, DO Taking Active   atorvastatin (LIPITOR) 80 MG tablet [id number] Yes Take 1 tablet (80 mg total) by mouth daily. Tobb, Kardie, DO Taking Active   colchicine 0.6 MG tablet [id number] Yes Take 0.6 mg by mouth daily as needed (  gout). [provider] Taking Active   diazepam (VALIUM) 10 MG tablet [id number] Yes TAKE ONE TABLET BY MOUTH EVERY TWELVE HOURS AS NEEDED FOR ANXIETY O'Sullivan, Melissa, NP Taking Active   dicyclomine (BENTYL) 10 MG capsule [id number] Yes Take 1 capsule (10 mg total) by mouth in the morning, at noon, in the evening, and at bedtime. O'Sullivan, Melissa, NP Taking Active   escitalopram (LEXAPRO) 20 MG tablet [id number] Yes TAKE ONE TABLET BY MOUTH ONCE DAILY O'Sullivan, Melissa, NP Taking Active   ezetimibe (ZETIA) 10 MG tablet [id number]  Take 1 tablet (10 mg total) by mouth daily. Tobb, Kardie, DO  Expired 01/26/21 2359   famotidine (PEPCID) 20 MG tablet [id number] Yes Take 1 tablet (20 mg total) by mouth 2 (two) times daily. Cirigliano, Vito V, DO Taking Active   isosorbide mononitrate (IMDUR) 30 MG 24 hr tablet [id number]  Take 0.5 tablets (15 mg total) by mouth daily. Tobb, Kardie, DO  Expired 01/26/21 2359   levothyroxine (EUTHYROX) 25 MCG tablet [id number] Yes TAKE 1 TABLET BY MOUTH ONCE DAILY BEFORE BREAKFAST O'Sullivan, Melissa, NP Taking Active   lisinopril (ZESTRIL) 2.5 MG tablet [id number] Yes Take 1 tablet (2.5 mg total) by mouth daily. Tobb, Kardie, DO Taking Active   meclizine (ANTIVERT) 25 MG tablet [id number] Yes TAKE ONE TABLET BY MOUTH TWICE DAILY AS  NEEDED FOR dizziness O'Sullivan, Melissa, NP Taking Active   metoprolol succinate (TOPROL-XL) 25 MG 24 hr tablet [id number] Yes Take 0.5 tablets (12.5 mg total) by mouth daily. Tobb, Kardie, DO Taking Active   nitroGLYCERIN (NITROSTAT) 0.4 MG SL tablet [id number] Yes Place 1 tablet (0.4 mg total) under the tongue every 5 (five) minutes as needed for chest pain. Tobb, Kardie, DO Taking Active   nystatin (NYSTATIN) powder [id number] Yes APPLY  POWDER TOPICALLY TO AFFECTED AREA 2 TIMES DAILY  Patient taking differently: APPLY  POWDER TOPICALLY TO AFFECTED AREA 2 TIMES DAILY   O'Sullivan, Melissa, NP Taking Active   pantoprazole (PROTONIX) 40 MG tablet [id number] Yes Take 1 tablet (40 mg total) by mouth 2 (two) times daily. O'Sullivan, Melissa, NP Taking Active   potassium chloride SA (KLOR-CON) 20 MEQ tablet [id number] Yes Take 1 tablet (20 mEq total) by mouth daily. O'Sullivan, Melissa, NP Taking Active   sucralfate (CARAFATE) 1 g tablet [id number] Yes TAKE ONE TABLET BY MOUTH FOUR TIMES DAILY. MAY cut pill in half AND DISSOLVE in water prior TO drinking  Patient taking differently: Take 0.5 g by mouth 4 (four) times daily -  with meals and at bedtime.   Cirigliano, Vito V, DO Taking Active             Patient Active Problem List   Diagnosis Date Noted   Back pain 12/03/2020   Acute cystitis without hematuria 12/03/2020   Grief reaction 12/03/2020   Fatty liver 12/01/2020   Other chest pain 09/17/2020   Coronary artery disease involving native coronary artery of native heart 09/17/2020   Metabolic syndrome 09/17/2020   Prediabetes 09/17/2020   Allergy 09/16/2020   Anxiety 09/16/2020   Hypertension 09/16/2020   Vertigo 09/16/2020   Dark stools 09/08/2020   Dysphagia    Esophageal stricture    Hiatal hernia    Epigastric pain    Hypothyroid 01/25/2018   Mild CAD 10/03/2017   Stress-induced cardiomyopathy 09/19/2017   Hypokalemia 09/14/2017   Non-ST elevation (NSTEMI) myocardial  infarction (HCC) - due to Demand Infarction 09/14/2017   CKD (chronic kidney disease) 04/04/2017   HLD (hyperlipidemia)      Allergic rhinitis    Gout 02/28/2017   Depression with anxiety 02/28/2017   Epigastric abdominal pain 12/22/2016   COPD with chronic bronchitis (HCC) 12/22/2016   GERD (gastroesophageal reflux disease) 12/22/2016   DOE (dyspnea on exertion) 12/22/2016   Peptic ulcer 01/04/2003    Immunization History  Administered Date(s) Administered   Fluad Quad(high Dose 65+) 03/14/2020, 12/03/2020   Influenza, High Dose Seasonal PF 01/23/2018, 12/07/2018   Influenza-Unspecified 12/23/2018   PFIZER(Purple Top)SARS-COV-2 Vaccination 05/14/2019, 06/08/2019   Pneumococcal Conjugate-13 07/16/2015   Pneumococcal Polysaccharide-23 12/23/2016   Tdap 09/02/2009, 04/05/2017    Conditions to be addressed/monitored: CAD, HTN, HLD, COPD, Anxiety, Depression and gout, GERD ; hypothyroidism  Care Plan : General Pharmacy (Adult)  Updates made by Eckard, Tammy, PHARMD since 02/02/2021 12:00 AM     Problem: Medication management and Monitoring and Coordination of Care for Chronic Conditions   Priority: High  Onset Date: 09/02/2020     Long-Range Goal: Medication management   Start Date: 09/02/2020  Recent Progress: On track  Priority: High  Note:     Current Barriers:  Unable to achieve control of hyperlipidemia  Does not adhere to prescribed medication regimen Does not maintain contact with provider office  Pharmacist Clinical Goal(s):  Over the next 90 days, patient will achieve adherence to monitoring guidelines and medication adherence to achieve therapeutic efficacy achieve control of hyperlipidemia as evidenced by LDL < 70 adhere to prescribed medication regimen as evidenced by filling prescriptions on times contact provider office for questions/concerns as evidenced notation of same in electronic health record through collaboration with PharmD and provider.    Interventions: 1:1 collaboration with O'Sullivan, Melissa, NP regarding development and update of comprehensive plan of care as evidenced by provider attestation and co-signature Inter-disciplinary care team collaboration (see longitudinal plan of care) Comprehensive medication review performed; medication list updated in electronic medical record  Hypertension: Controlled; BP goal is:  <130/80  Patient is not checking BP at home - states she does not have BP cuff.  Patient has failed these meds in the past: None noted  Current Regimen: Metoprolol Succinate 25mg 0.5 tab daily Lisinopril 2.5mg daily Interventions:  Reviewed signs and symptoms of low blood pressure  to monitor for.  Discussed blood pressure  goal Recommended get blood pressure  cuff and check blood pressure  2 to 3 times per week. (Recommended at last visit, has not gotten cuff yet) Continue current medications for blood pressure    Hyperlipidemia / history of MI: Not controlled; LDL goal < 70  Patient has failed these meds in past: None noted   In September patient reported she just stopped taking atorvastatin because she was taking so many other medications Current therapy Atorvastatin 80mg daily (increased 08/18/2020) Ezetimibe 10mg daily (added 09/17/2020)  Metoprolol Succinate 25mg 1/2 tab daily Aspirin 81mg daily  Nitroglycerin 0.4mg - place 1 tablet under tongue as needed for chest pain, may repeat in 5 minutes if chest pain not relieved. Patient requests refill for Nitroglycerin but looks like #50 tablets was filled 01/07/2021. She is currently getting w/u for chest pain by PCP. Patient states that she has been taking Pepto-Bismol for chest pain and this seems to help.  Per last ED visit pain was thought to be of GI and not cardio origin Patient missed appointment with cardiologist in September due to being out of town Interventions:  Discussed LDL goal and recent LDL results Discussed importance of taking  atorvastatin daily to both lower cholesterol and prevent ASCVD /   recurrent MI  Recommended she reschedule appointment with cardiologist.  See below for discussion about medication adherence  Chronic Obstructive Pulmonary Disease: Controlled Current treatment: Albuterol solution for nebulizer - use in nebulizer up to every 6 hours as needed for shortness of breath No exacerbations requiring treatment in the last 6 months  Interventions: None  Depression/Anxiety:  Controlled Patient reports she is feeling so much better since back pain has improved. It has also helped with her mood. She doe still have some days that she is sad when she thinks about her brother's recent passing.  Patient has failed these meds in past: citalopram (ineffective) Patient states she has 6 tablets of diazepam left from prescription frilled 01/12/2021 for #45 tablets or  23 day supply.  She reports to me that she usually only takes at night for sleep but some days will take a dose during the day. She keeps diazepam on her bedside table.  Current treatment: Escitalopram 20mg daily Diazepam 10mg every 12 hours as needed  Interventions:  Discussed adherence and importance of taking escitalopram for depression / anxiety Encouraged patient to use diazepam sparingly - only at night if able. Also mentioned she should secure her medication to prevent diversion. She is not due to refill diazepam until around 02/04/2021. Consider grief counseling Continue current therapy  Hypothyroidism:  TSH was WNL at last check on 08/18/2020 Patient has failed these meds in past: None noted  Patient is currently controlled on the following medications:  levothyroxine 25mcg daily  Intervention:  Continue current therapy   Gout:   Goal Uric Acid < 6 mg/dL   Last uric acid was not at goal (UA = 8.4 on 03/14/2020) but patient denies symptoms of gout at this time.  Current medications:  Allopurinol 100mg - take 2 tablets = 200mg  daily Colchicine 0.6mg up to bid as needed for acute gout Medications that may increase uric acid levels: Aspirin Last gout flare: Feb 2022 Patient has failed these meds in past: None noted   Interventions:  Reviewed preventative versus treatment medications for gout.  Continue current therapy Consider checking Uric acid with next labs.    Medication management:   Patient was using scissors to cut her pills. Provided her with a pill cutter after last appointment (was mailed to patient at her home) (completed at previous visit) Evaluated patient's current medication administration process.  Previously patient was taking all medication out of the prescription bottle and placed in weekly pill container however she did not fill the container by the day, instead she had all her metoprolol in Monday's slot; all her levothyroxine in Tuesday's slot, all her pantoprazole in Wednesday's slot, etc. She has 2 weekly pill containers like this but could not identify which pills were in each slot. She states she would just take 1 tablet every morning from each slot.  She started medication adherence packaging 01/08/2021 and really likes it. Has helped her with remembering to take her medication on time.  Current pharmacy:  [facility] Intervention:  Consulted with [facility]. Here next delivery is 02/05/2021 and date to start next packaging 02/09/2021. Asked that ezetimibe be included in this month's packaging.  Reviewed with patient which medications are to be used as needed (these will not be included in packaging).  Discussed limiting use of diazepam due to increased risk of falls and dependence. Also advised patient to secure all prescriptions from potential diversion.    Patient Goals/Self-Care Activities Over the next 90 days, patient will:  take   medications as prescribed, focus on medication adherence by filing prescriptions at one pharmacy, and call if any issues with medications - especially with  splitting tablets or swallowing tablets.   Follow Up Plan:  will check back in with patient in 14 days to review medications          Medication Assistance: None required.  Patient affirms current coverage meets needs.  Patient's preferred pharmacy is:  [place], [place] [address] Phone: [phone] Fax: [phone]  [facility] - [place], [place] - [address] [place] [address] Phone: [phone] Fax: [phone]  Follow Up:  Patient agrees to Care Plan and Follow-up.  Plan: follow up in 14 to 21 days to review packaging and make sure patient has no questions.   Cherre Robins, PharmD Clinical Pharmacist [facility] [place] [phone]

## 2021-02-02 NOTE — Telephone Encounter (Signed)
Pt stated she was on the phone going over her ultrasound and it came back normal, but she was still wondering why she was having chest pains please advise.

## 2021-02-05 ENCOUNTER — Other Ambulatory Visit: Payer: Self-pay | Admitting: Family

## 2021-02-06 ENCOUNTER — Telehealth: Payer: Self-pay | Admitting: Family

## 2021-02-06 MED ORDER — DIAZEPAM 10 MG PO TABS: ORAL_TABLET | ORAL | 0 refills | Status: DC

## 2021-02-06 NOTE — Telephone Encounter (Signed)
-----   Message from Raynelle Jan sent at 02/05/2021 12:59 PM EDT ----- Regarding: Rx Hello,Veronica Wolf is due for a medication delivery from [facility], and they do not have refills on her Diazepam. Per the pharmacy they have sent a request electronically, but have not received it. Can this be sent to [facility], as her delivery will be in a few days. Thanks so much.

## 2021-02-10 ENCOUNTER — Telehealth: Payer: Self-pay | Admitting: Family

## 2021-02-10 NOTE — Telephone Encounter (Signed)
Diabetic footwear prescription form was received by fax and placed on Veronica Wolf's box.

## 2021-02-11 NOTE — Telephone Encounter (Signed)
Patient contacted about appointment to evaluate her feet for this, she reports she did not request diabetic footwear and she is not interested in getting some.  Form was discarded at her request.

## 2021-02-12 ENCOUNTER — Ambulatory Visit (INDEPENDENT_AMBULATORY_CARE_PROVIDER_SITE_OTHER): Payer: Medicare HMO | Admitting: Pharmacist

## 2021-02-12 DIAGNOSIS — F419 Anxiety disorder, unspecified: Secondary | ICD-10-CM

## 2021-02-12 DIAGNOSIS — F418 Other specified anxiety disorders: Secondary | ICD-10-CM

## 2021-02-12 DIAGNOSIS — I2510 Atherosclerotic heart disease of native coronary artery without angina pectoris: Secondary | ICD-10-CM

## 2021-02-12 DIAGNOSIS — E039 Hypothyroidism, unspecified: Secondary | ICD-10-CM

## 2021-02-12 DIAGNOSIS — E785 Hyperlipidemia, unspecified: Secondary | ICD-10-CM

## 2021-02-12 DIAGNOSIS — I10 Essential (primary) hypertension: Secondary | ICD-10-CM

## 2021-02-12 DIAGNOSIS — Z79899 Other long term (current) drug therapy: Secondary | ICD-10-CM

## 2021-02-13 ENCOUNTER — Telehealth: Payer: Self-pay | Admitting: Family

## 2021-02-13 NOTE — Chronic Care Management (AMB) (Signed)
Chronic Care Management Pharmacy Note  02/13/2021 Name:  Veronica Wolf MRN:  [id number] DOB:  1941/01/06  Summary: Has received second month of adherence packaging and likes a lot! Improved adherence.  Confirmed that ezetimibe was included in this months packaging  Recommendations/Changes made from today's visit: Reminded to make follow up with cardiologist.  Discussed how often she is using diazepam. Advised her to secure medication at home and to monitor her intake more closely - should try to use only if needed.   Subjective: Veronica Wolf is an 80 y.o. year old female who is a primary patient of Debbrah Alar, NP.  The CCM team was consulted for assistance with disease management and care coordination needs.    Engaged with patient by telephone for follow up visit in response to provider referral for pharmacy case management and/or care coordination services.   Consent to Services:  The patient was given information about Chronic Care Management services, agreed to services, and gave verbal consent prior to initiation of services.  Please see initial visit note for detailed documentation.   Patient Care Team: Debbrah Alar, NP as PCP - General (Internal Medicine) Revankar, Reita Cliche, MD as PCP - Cardiology (Cardiology) Michiel Cowboy, RN as [place], [place], RPH-CPP (Pharmacist)  Recent office visits: 01/09/2021 - PCP Inda Castle, NP) Seen for epigatric / chest pain. Labs ordered - CBC, BMP and lipase. Also ordered CT abdomin. No med changes noted.  12/03/2020- PCP Inda Castle, NP) Seen for back pain. Prescribed Medrol dose pack and ordered Xay. Also checked urinalysis and recommended she complete course of ciprofloxacin given while she was in [place].  09/16/2020 - PCP Phone Call. Ultrasound showed fatty liver. Recommended follow up with GI.  09/08/2020 - PCP Inda Castle) Seen for abdominal pain and diarrhea. Hemoccult negative.  Reordered Abdominal CT, LFTs and lipase, CBC. No medication changes 08/18/2020 - PCP Inda Castle) - follow up chronic conditions. Atorvastatin increased to $RemoveBefo'80mg'TVhMLMkVdjd$  daily after LDL checked and found to be 125; Also added aspirin $RemoveBefor'81mg'KeHeiJgldTOM$  daily due to h/o MI.  Recent consult visits: 09/17/2020 - Cardio (Dr Harriet Masson) CAD / intermittant chest pain. Started low dose Imdue $RemoveBe'15mg'HCCLfYoaY$  daily and ezetimibe $RemoveBefor'10mg'uQweFgJfmonz$  daily. Ordered nucular stress test  GI - see below / EGD done at [facility] visits: 08/13/2020 - EGD at [facility]   Objective:  Lab Results  Component Value Date   CREATININE 1.40 (H) 01/09/2021   CREATININE 1.38 (H) 08/18/2020   CREATININE 1.23 (H) 06/11/2020    Lab Results  Component Value Date   HGBA1C 6.5 01/09/2021   Last diabetic Eye exam: No results found for: HMDIABEYEEXA  Last diabetic Foot exam: No results found for: HMDIABFOOTEX      Component Value Date/Time   CHOL 204 (H) 08/18/2020 1203   TRIG 182.0 (H) 08/18/2020 1203   HDL 42.30 08/18/2020 1203   CHOLHDL 5 08/18/2020 1203   VLDL 36.4 08/18/2020 1203   LDLCALC 125 (H) 08/18/2020 1203   LDLCALC 108 (H) 03/14/2020 1439    Hepatic Function Latest Ref Rng & Units 01/09/2021 09/08/2020 08/18/2020  Total Protein 6.0 - 8.3 g/dL 6.7 6.3 6.6  Albumin 3.5 - 5.2 g/dL 3.8 3.6 3.8  AST 0 - 37 U/L $Remo'14 10 12  'HqiEF$ ALT 0 - 35 U/L $Remo'6 5 7  'kUUnx$ Alk Phosphatase 39 - 117 U/L 82 64 80  Total Bilirubin 0.2 - 1.2 mg/dL 0.3 0.4 0.2  Bilirubin, Direct  0.0 - 0.3 mg/dL - 0.1 -    Lab Results  Component Value Date/Time   TSH 3.57 08/18/2020 12:03 PM   TSH 1.77 03/14/2020 02:39 PM   FREET4 0.85 01/24/2018 03:04 PM    CBC Latest Ref Rng & Units 09/08/2020 06/11/2020 01/23/2018  WBC 4.0 - 10.5 K/uL 7.3 9.1 8.3  Hemoglobin 12.0 - 15.0 g/dL 11.6(L) 11.5(L) 11.9(L)  Hematocrit 36.0 - 46.0 % 36.1 38.1 36.6  Platelets 150.0 - 400.0 K/uL 219.0 219 190.0    No results found for: VD25OH  Clinical ASCVD: Yes  The ASCVD Risk  score (Arnett DK, et al., 2019) failed to calculate for the following reasons:   The 2019 ASCVD risk score is only valid for ages 63 to 41   The patient has a prior MI or stroke diagnosis    Other:  DEXA 03/05/2014             AP LUMBAR SPINE not used for calculation due to significant degenerative changes.             Left FOREARM (1/3 RADIUS) T Score: -0.4             Left FEMUR neck T-Score: -0.7     Social History   Tobacco Use  Smoking Status Former   Types: Cigarettes   Quit date: 04/24/2000   Years since quitting: 20.8  Smokeless Tobacco Never   BP Readings from Last 3 Encounters:  01/09/21 (!) 143/93  12/03/20 (!) 110/55  09/17/20 100/60   Pulse Readings from Last 3 Encounters:  01/09/21 88  12/03/20 87  09/17/20 87   Wt Readings from Last 3 Encounters:  01/09/21 189 lb (85.7 kg)  12/03/20 188 lb (85.3 kg)  09/17/20 205 lb (93 kg)    Assessment: Review of patient past medical history, allergies, medications, health status, including review of consultants reports, laboratory and other test data, was performed as part of comprehensive evaluation and provision of chronic care management services.   SDOH:  (Social Determinants of Health) assessments and interventions performed:     CCM Care Plan  Allergies  Allergen Reactions   Ibuprofen Other (See Comments)    Pt has hx of peptic ulcer and diverticulitis.     Medications Reviewed Today     Reviewed by Cherre Robins, RPH-CPP (Pharmacist) on 02/13/21 at 1024  Med List Status: <None>   Medication Order Taking? Sig Documenting Provider Last Dose Status Informant  albuterol (PROVENTIL) (2.5 MG/3ML) 0.083% nebulizer solution [id number] Yes USE 1 VIAL IN NEBULIZER EVERY 6 HOURS AS NEEDED FOR WHEEZING AND FOR SHORTNESS OF [name], Lenna Sciara, NP Taking Active   allopurinol (ZYLOPRIM) 100 MG tablet [id number] Yes Take 2 tablets (200 mg total) by mouth daily. Debbrah Alar, NP Taking Active   aspirin 81  MG EC tablet [id number] Yes Take 1 tablet (81 mg total) by mouth daily. Swallow whole. Tobb, Godfrey Pick, DO Taking Active   atorvastatin (LIPITOR) 80 MG tablet [id number] Yes Take 1 tablet (80 mg total) by mouth daily. Berniece Salines, DO Taking Active   colchicine 0.6 MG tablet [id number] No Take 0.6 mg by mouth daily as needed (gout).  Patient not taking: Reported on 02/13/2021   [provider] Not Taking Active   diazepam (VALIUM) 10 MG tablet [id number] Yes TAKE ONE TABLET BY MOUTH EVERY TWELVE HOURS AS NEEDED FOR ANXIETY Debbrah Alar, NP Taking Active   dicyclomine (BENTYL) 10 MG capsule [id number] Yes Take 1 capsule (10 mg total) by mouth in  the morning, at noon, in the evening, and at bedtime. Debbrah Alar, NP Taking Active   escitalopram (LEXAPRO) 20 MG tablet [id number] Yes TAKE ONE TABLET BY MOUTH ONCE DAILY Debbrah Alar, NP Taking Active   ezetimibe (ZETIA) 10 MG tablet [id number]  Take 1 tablet (10 mg total) by mouth daily. Tobb, Kardie, DO  Expired 01/26/21 2359   famotidine (PEPCID) 20 MG tablet [id number] Yes Take 1 tablet (20 mg total) by mouth 2 (two) times daily. Cirigliano, Vito V, DO Taking Active   isosorbide mononitrate (IMDUR) 30 MG 24 hr tablet [id number]  Take 0.5 tablets (15 mg total) by mouth daily. Tobb, Kardie, DO  Expired 01/26/21 2359   levothyroxine (EUTHYROX) 25 MCG tablet [id number] Yes TAKE 1 TABLET BY MOUTH ONCE DAILY BEFORE Adaline Sill, NP Taking Active   lisinopril (ZESTRIL) 2.5 MG tablet [id number] Yes Take 1 tablet (2.5 mg total) by mouth daily. Tobb, Godfrey Pick, DO Taking Active   meclizine (ANTIVERT) 25 MG tablet [id number] Yes TAKE ONE TABLET BY MOUTH TWICE DAILY AS NEEDED FOR dizziness Debbrah Alar, NP Taking Active   metoprolol succinate (TOPROL-XL) 25 MG 24 hr tablet [id number] Yes Take 0.5 tablets (12.5 mg total) by mouth daily. Tobb, Kardie, DO Taking Active   nitroGLYCERIN (NITROSTAT) 0.4 MG SL tablet [id number] Yes Place  1 tablet (0.4 mg total) under the tongue every 5 (five) minutes as needed for chest pain. Tobb, Kardie, DO Taking Active   nystatin (NYSTATIN) powder [id number] Yes APPLY  POWDER TOPICALLY TO AFFECTED AREA 2 TIMES DAILY  Patient taking differently: APPLY  POWDER TOPICALLY TO AFFECTED AREA 2 TIMES DAILY   Debbrah Alar, NP Taking Active   pantoprazole (PROTONIX) 40 MG tablet [id number] Yes Take 1 tablet (40 mg total) by mouth 2 (two) times daily. Debbrah Alar, NP Taking Active   potassium chloride SA (KLOR-CON) 20 MEQ tablet [id number] Yes Take 1 tablet (20 mEq total) by mouth daily. Debbrah Alar, NP Taking Active   sucralfate (CARAFATE) 1 g tablet [id number] Yes TAKE ONE TABLET BY MOUTH FOUR TIMES DAILY. MAY cut pill in half AND DISSOLVE in water prior TO drinking  Patient taking differently: Take 0.5 g by mouth 4 (four) times daily -  with meals and at bedtime.   Lavena Bullion, DO Taking Active             Patient Active Problem List   Diagnosis Date Noted   Back pain 12/03/2020   Acute cystitis without hematuria 12/03/2020   Grief reaction 12/03/2020   Fatty liver 12/01/2020   Other chest pain 09/17/2020   Coronary artery disease involving native coronary artery of native heart 11/57/2620   Metabolic syndrome 35/59/7416   Prediabetes 09/17/2020   Allergy 09/16/2020   Anxiety 09/16/2020   Hypertension 09/16/2020   Vertigo 09/16/2020   Dark stools 09/08/2020   Dysphagia    Esophageal stricture    Hiatal hernia    Epigastric pain    Hypothyroid 01/25/2018   Mild CAD 10/03/2017   Stress-induced cardiomyopathy 09/19/2017   Hypokalemia 09/14/2017   Non-ST elevation (NSTEMI) myocardial infarction [facility]) - due to Demand Infarction 09/14/2017   CKD (chronic kidney disease) 04/04/2017   HLD (hyperlipidemia)    Allergic rhinitis    Gout 02/28/2017   Depression with anxiety 02/28/2017   Epigastric abdominal pain 12/22/2016   COPD with chronic bronchitis ([place])  12/22/2016   GERD (gastroesophageal reflux disease) 12/22/2016   DOE (dyspnea on exertion) 12/22/2016   Peptic ulcer 01/04/2003  Immunization History  Administered Date(s) Administered   Fluad Quad(high Dose 65+) 03/14/2020, 12/03/2020   Influenza, High Dose Seasonal PF 01/23/2018, 12/07/2018   Influenza-Unspecified 12/23/2018   PFIZER(Purple Top)SARS-COV-2 Vaccination 05/14/2019, 06/08/2019   Pneumococcal Conjugate-13 07/16/2015   Pneumococcal Polysaccharide-23 12/23/2016   Tdap 09/02/2009, 04/05/2017    Conditions to be addressed/monitored: CAD, HTN, HLD, COPD, Anxiety, Depression and gout, GERD ; hypothyroidism  Care Plan : General Pharmacy (Adult)  Updates made by Cherre Robins, RPH-CPP since 02/13/2021 12:00 AM     Problem: Medication management and Monitoring and Coordination of Care for Chronic Conditions   Priority: High  Onset Date: 09/02/2020     Long-Range Goal: Medication management   Start Date: 09/02/2020  Recent Progress: On track  Priority: High  Note:     Current Barriers:  Unable to achieve control of hyperlipidemia  Does not adhere to prescribed medication regimen Does not maintain contact with provider office  Pharmacist Clinical Goal(s):  Over the next 90 days, patient will achieve adherence to monitoring guidelines and medication adherence to achieve therapeutic efficacy achieve control of hyperlipidemia as evidenced by LDL < 70 adhere to prescribed medication regimen as evidenced by filling prescriptions on times contact provider office for questions/concerns as evidenced notation of same in electronic health record through collaboration with PharmD and provider.   Interventions: 1:1 collaboration with Debbrah Alar, NP regarding development and update of comprehensive plan of care as evidenced by provider attestation and co-signature Inter-disciplinary care team collaboration (see longitudinal plan of care) Comprehensive medication review  performed; medication list updated in electronic medical record  Hypertension: Controlled; BP goal is:  <130/80  Patient is not checking BP at home - states she does not have BP cuff.  Patient has failed these meds in the past: None noted  Current Regimen: Metoprolol Succinate 25mg  0.5 tab daily Lisinopril 2.5mg  daily Interventions:  Reviewed signs and symptoms of low blood pressure  to monitor for.  Discussed blood pressure  goal Recommended get blood pressure  cuff and check blood pressure  2 to 3 times per week. (Recommended at last visit, has not gotten cuff yet) Continue current medications for blood pressure    Hyperlipidemia / history of MI: Not controlled; LDL goal < 70  Patient has failed these meds in past: None noted   In September patient reported she just stopped taking atorvastatin because she was taking so many other medications Current therapy Atorvastatin 80mg  daily (increased 08/18/2020) Ezetimibe 10mg  daily (added 09/17/2020)  Metoprolol Succinate 25mg  1/2 tab daily Aspirin 81mg  daily  Nitroglycerin 0.4mg  - place 1 tablet under tongue as needed for chest pain, may repeat in 5 minutes if chest pain not relieved. Patient requests refill for Nitroglycerin but looks like #50 tablets was filled 01/07/2021. She is currently getting w/u for chest pain by PCP. Patient states that she has been taking Pepto-Bismol for chest pain and this seems to help.  Per last ED visit pain was thought to be of GI and not cardio origin Patient missed appointment with cardiologist in September due to being out of town Interventions:  Discussed LDL goal and recent LDL results Discussed importance of taking atorvastatin daily to both lower cholesterol and prevent ASCVD / recurrent MI  Reminded patient to reschedule appointment with cardiologist.  See below for discussion about medication adherence   Chronic Obstructive Pulmonary Disease: Controlled Current treatment: Albuterol solution  for nebulizer - use in nebulizer up to every 6 hours as needed for shortness of breath No exacerbations  requiring treatment in the last 6 months  Interventions: None  Depression/Anxiety:  Controlled Patient reports she is feeling so much better since back pain has improved. It has also helped with her mood. She doe still have some days that she is sad when she thinks about her brother's recent passing.  Patient has failed these meds in past: citalopram (ineffective) Patient states she has 6 tablets of diazepam left from prescription frilled 01/12/2021 for #45 tablets or  23 day supply.  She reports to me that she usually only takes at night for sleep but some days will take a dose during the day. She keeps diazepam on her bedside table.  Current treatment: Escitalopram 20mg  daily Diazepam 10mg  every 12 hours as needed  Interventions:  Discussed adherence and importance of taking escitalopram for depression / anxiety Encouraged patient to use diazepam sparingly - only at night if able. Also mentioned she should secure her medication to prevent diversion.  Consider grief counseling Continue current therapy  Hypothyroidism:  TSH was WNL at last check on 08/18/2020 Patient has failed these meds in past: None noted  Patient is currently controlled on the following medications:  levothyroxine 42mcg daily  Intervention:  Continue current therapy   Gout:   Goal Uric Acid < 6 mg/dL   Last uric acid was not at goal (UA = 8.4 on 03/14/2020) but patient denies symptoms of gout at this time.  Current medications:  Allopurinol 100mg  - take 2 tablets = 200mg  daily Colchicine 0.6mg  up to bid as needed for acute gout Medications that may increase uric acid levels: Aspirin Last gout flare: Feb 2022 Patient has failed these meds in past: None noted   Interventions:  Reviewed preventative versus treatment medications for gout.  Continue current therapy Consider checking Uric acid with next labs.     Medication management:   Patient was using scissors to cut her pills. Provided her with a pill cutter after last appointment (was mailed to patient at her home) (completed at previous visit) Evaluated patient's current medication administration process.  Previously patient was taking all medication out of the prescription bottle and placed in weekly pill container however she did not fill the container by the day, instead she had all her metoprolol in Monday's slot; all her levothyroxine in Tuesday's slot, all her pantoprazole in Wednesday's slot, etc. She has 2 weekly pill containers like this but could not identify which pills were in each slot. She states she would just take 1 tablet every morning from each slot.  She started medication adherence packaging 01/08/2021 and really likes it. Has helped her with remembering to take her medication on time.  Current pharmacy:  [facility] Intervention:  Verified patient has received packages medications for 1 month (ezetimibe was included in this month's packaging) Reviewed with patient which medications are to be used as needed (these will not be included in packaging).  Discussed limiting use of diazepam due to increased risk of falls and dependence. Also advised patient to secure all prescriptions from potential diversion.    Patient Goals/Self-Care Activities Over the next 90 days, patient will:  take medications as prescribed, focus on medication adherence by filing prescriptions at one pharmacy, and call if any issues with medications - especially with splitting tablets or swallowing tablets.   Follow Up Plan:  will check back in with patient in 1 to 2 months          Medication Assistance: None required.  Patient affirms current coverage meets  needs.  Patient's preferred pharmacy is:  [facility], [place] [address] Phone: [phone] Fax:  [phone]  [facility] - [place], [place] - [address] [place] [address] Phone: [phone] Fax: [phone]  Follow Up:  Patient agrees to Care Plan and Follow-up.  Plan: follow up in 1 to 2 months   Cherre Robins, PharmD Clinical Pharmacist [facility] [phone]

## 2021-02-13 NOTE — Patient Instructions (Signed)
Visit Information  CARE PLAN     Hypertension Screening BP Readings from Last 3 Encounters:  01/09/21 (!) 143/93  12/03/20 (!) 110/55  09/17/20 100/60   Pharmacist Clinical Goal(s): Over the next 90 days, patient will work with PharmD and providers to achieve BP goal <130/80 Current regimen:  Metoprolol Succinate 25mg  0.5 tab daily Lisinopril 2.5mg  daily Interventions: Discussed blood pressure goal Consider purchasing blood pressure cuff for home use and check blood pressure 2 to 3 times per week and record Reviewed refill records and assessed for adherence issues. Discussed importance of taking metoprolol and lisinopril for heart health Patient self care activities - Over the next 90 days, patient will: Maintain blood pressure less than 130/80 Consider purchasing blood pressure cuff and check blood pressure 2 to 3 times per week and record Continue current medications    Hyperlipidemia / Heart health Lab Results  Component Value Date/Time   LDLCALC 125 (H) 08/18/2020 12:03 PM   LDLCALC 108 (H) 03/14/2020 02:39 PM   Pharmacist Clinical Goal(s): Over the next 90 days, patient will work with PharmD and providers to achieve LDL goal < 70 Current regimen:  Atorvastatin 80mg  daily  Ezetimibe 10mg  daily Aspirin 81mg  daily  Metoprolol succinate ER 25mg  - take 0.5 tablet daily Nitroglycerin 0.4mg  - place 1 tablet under the tongue as needed for chest pain, may repeat in 5 minutes if chest pain not relieved.  Interventions: Discussed LDL goal and importance of medication adherence Repeat LDL at next office visit since ezetimibe started Discussed adherence Patient self care activities - Over the next 90 days, patient will: Continue medications currently prescribed to lower cholesterol and to prevent heart attack Rescheduled appointment with cardiologist  Anxiety / Depression:  Pharmacist Clinical Goal(s): Over the next 45 days, patient will work with PharmD and providers to  maintain control of anxiety and depression symptoms Current regimen:  Escitalopram 20mg  daily  Diazepam 10mg  take 1 tablet every 12 hours if needed for anxiety Interventions: Discussed using diazepam only if needed. Limit use during daytime if able.  Patient self care activities - Over the next 90 days, patient will: Maintain current medication regimen for anxiety and depression  Remember to take diazepam only as needed.   Gout:  Pharmacist Clinical Goal(s): Over the next 90 days, patient will work with PharmD and providers to prevent acute gout episodes / lower uric acid Current regimen:  Allopurinol 100mg  - take 2 tablets = 200mg  daily (prevention) Colchicine 0.6mg  take up to twice a day as needed for gout attack Interventions: Discussed difference between maintenance / presentation medication and treatment Patient self care activities - Over the next 90 days, patient will: Maintain current medication regimen for gout  Pre-Diabetes Lab Results  Component Value Date/Time   HGBA1C 6.5 01/09/2021 08:10 AM   HGBA1C 6.2 (H) 09/17/2020 10:38 AM   Pharmacist Clinical Goal(s): Over the next 90 days, patient will work with PharmD and providers to maintain A1c goal <6.5% Current regimen:  Diet and exercise management   Interventions: Discussed the importance of limiting carbohydrates (30-45 grams per meal) Patient self care activities - Over the next 90 days, patient will: Maintain a1c <6.5%  Hypothyroidism Pharmacist Clinical Goal(s) Over the next 90 days, patient will work with PharmD and providers to maintain TSH within normal limits and reduce risk of symptoms associated with hypothyroidism Current regimen:  Euthyrox / levothyroxine daily Interventions: Discussed importance of medication adherence Reminded to separate Euthyrox / levothyroxine dose by 30 minutes from breakfast  and morning medications Patient self care activities - Over the next 90 days, patient  will: Maintain hypothyroidism medication regimen  Medication management Pharmacist Clinical Goal(s): Over the next 90 days, patient will work with PharmD and providers to achieve optimal medication adherence Current pharmacy: [facility] switched to [facility] Interventions Comprehensive medication review performed. Utilize [facility] pharmacy for medication synchronization, packaging and delivery Assisted patient in identifying medications  Coordinated next delivery with [facility] Reviewed each medication and reason for taking.  Remember to secure all medications in your home from potential diversion or accidental ingestion.  Patient self care activities - Over the next 90 days, patient will: Focus on medication adherence by filling and taking medications appropriately  Take medications as prescribed Purchase a pill cutter at the pharmacy.  Report any questions or concerns to PharmD and/or provider(s) Contact Serayah Yazdani, PharmD if any issues with cost of medications or difficulty swallowing tablets.    The patient verbalized understanding of instructions, educational materials, and care plan provided today and declined offer to receive copy of patient instructions, educational materials, and care plan.   Telephone follow up appointment with care management team member scheduled for: 1 to 2 months  [name], PharmD Clinical Pharmacist [facility]

## 2021-02-13 NOTE — Telephone Encounter (Signed)
error 

## 2021-02-16 ENCOUNTER — Telehealth: Payer: Self-pay | Admitting: Family

## 2021-02-16 ENCOUNTER — Ambulatory Visit (INDEPENDENT_AMBULATORY_CARE_PROVIDER_SITE_OTHER): Payer: Medicare HMO | Admitting: Family

## 2021-02-16 ENCOUNTER — Other Ambulatory Visit: Payer: Self-pay

## 2021-02-16 VITALS — BP 102/56 | HR 82 | Temp 97.9°F | Resp 16 | Ht 64.0 in | Wt 182.0 lb

## 2021-02-16 DIAGNOSIS — I10 Essential (primary) hypertension: Secondary | ICD-10-CM

## 2021-02-16 DIAGNOSIS — J449 Chronic obstructive pulmonary disease, unspecified: Secondary | ICD-10-CM

## 2021-02-16 DIAGNOSIS — M109 Gout, unspecified: Secondary | ICD-10-CM | POA: Diagnosis not present

## 2021-02-16 DIAGNOSIS — R7303 Prediabetes: Secondary | ICD-10-CM

## 2021-02-16 DIAGNOSIS — R1013 Epigastric pain: Secondary | ICD-10-CM

## 2021-02-16 DIAGNOSIS — H6692 Otitis media, unspecified, left ear: Secondary | ICD-10-CM | POA: Insufficient documentation

## 2021-02-16 DIAGNOSIS — F419 Anxiety disorder, unspecified: Secondary | ICD-10-CM | POA: Diagnosis not present

## 2021-02-16 NOTE — Assessment & Plan Note (Signed)
Lab Results  Component Value Date   HGBA1C 6.5 01/09/2021   Discussed diabetic diet.

## 2021-02-16 NOTE — Assessment & Plan Note (Signed)
Lab Results  Component Value Date   TSH 3.57 08/18/2020   Will need TSH next visit.  Continue current dose of synthroid.

## 2021-02-16 NOTE — Assessment & Plan Note (Signed)
New.  Will rx with amoxicillin.  

## 2021-02-16 NOTE — Progress Notes (Addendum)
Subjective:   By signing my name below, I, [name], attest that this documentation has been prepared under the direction and in the presence of [name] NP. 02/16/2021    Patient ID: Veronica Wolf, female    DOB: 17-Mar-1941, 80 y.o.   MRN: [id number]  Chief Complaint  Patient presents with   Abdominal Pain    Patient reports she is still having abdominal pain    HPI Patient is in today for a office visit. She is present with her daughter during this visit.   Stomach pain- She complains of mild stomach pain after waking up this morning. She thinks it may be her medication. The pain starts from her chest and goes down. She completed a [place] which showed nothing new. She has seen a GI specialist and completed a endoscopy which showed a hiatal hernia. They also widened a narrowing in her esophagus. She is interested in seeing a GI specialist to manage her pain.  Diabetes- Her last A1c showed she was borderline diabetic. She is requesting a diet to help manage it.  Sleep- She complains of waking up late. She completes at least 8 hours of sleep but sleeps late around 1-2 pm.  Blood pressure- Her blood pressure is slightly low during this visit. She reports having mild occasional dizziness. She continues taking 2.5 mg lisinopril daily PO, 25 mg metoprolol succinate daily PO and reports no new issues while taking it.   BP Readings from Last 3 Encounters:  02/16/21 (!) 102/56  01/09/21 (!) 143/93  12/03/20 (!) 110/55   Pulse Readings from Last 3 Encounters:  02/16/21 82  01/09/21 88  12/03/20 87   Gout- She has no recent flare ups of gout. She continues taking 200 mg allopurinol daily PO.  Breathing- She reports having mild wheezing at night and is using her inhaler to manage her symptoms and finds relief.  Transportation- She is requesting to connect with services to help her reach her appointments safely.    Health Maintenance Due  Topic Date Due   Zoster Vaccines-  Shingrix (1 of 2) Never done   COVID-19 Vaccine (3 - Pfizer risk series) 07/06/2019    Past Medical History:  Diagnosis Date   Allergic rhinitis    Allergy    seasonal allergies   Anxiety    CKD (chronic kidney disease) 04/04/2017   COPD (chronic obstructive pulmonary disease) (HCC)    uses inhaler   Depression    Fatty liver    GERD (gastroesophageal reflux disease)    Gout    hx of   Hyperlipidemia    on meds   Hypertension    on meds   Myocardial infarction [facility])    Peptic ulcer 01/04/2003   Vertigo     Past Surgical History:  Procedure Laterality Date   ABDOMINAL HYSTERECTOMY     APPENDECTOMY     BIOPSY  08/13/2020   Procedure: BIOPSY;  Surgeon: Shellia Cleverly, DO;  Location: [facility];  Service: Gastroenterology;;   ESOPHAGOGASTRODUODENOSCOPY (EGD) WITH PROPOFOL N/A 08/13/2020   Procedure: ESOPHAGOGASTRODUODENOSCOPY (EGD) WITH PROPOFOL;  Surgeon: Shellia Cleverly, DO;  Location: [facility];  Service: Gastroenterology;  Laterality: N/A;   LEFT HEART CATH AND CORONARY ANGIOGRAPHY N/A 06/13/2017   Procedure: LEFT HEART CATH AND CORONARY ANGIOGRAPHY;  Surgeon: Marykay Lex, MD;  Location: [facility] INVASIVE CV LAB;  Service: Cardiovascular;  Laterality: N/A;   RIGHT/LEFT HEART CATH AND CORONARY ANGIOGRAPHY N/A 09/16/2017   Procedure: RIGHT/LEFT HEART  CATH AND CORONARY ANGIOGRAPHY;  Surgeon: Swaziland, Peter M, MD;  Location: [facility] INVASIVE CV LAB;  Service: Cardiovascular;  Laterality: N/A;   SAVORY DILATION N/A 08/13/2020   Procedure: SAVORY DILATION;  Surgeon: Shellia Cleverly, DO;  Location: [facility];  Service: Gastroenterology;  Laterality: N/A;   ULTRASOUND GUIDANCE FOR VASCULAR ACCESS  09/16/2017   Procedure: Ultrasound Guidance For Vascular Access;  Surgeon: Swaziland, Peter M, MD;  Location: [facility] INVASIVE CV LAB;  Service: Cardiovascular;;   WISDOM TOOTH EXTRACTION      Family History  Problem Relation Age of Onset   Breast cancer Mother    Stroke Father     Stomach cancer Neg Hx    Colon cancer Neg Hx    Pancreatic cancer Neg Hx    Esophageal cancer Neg Hx    Colon polyps Neg Hx    Rectal cancer Neg Hx     Social History   Socioeconomic History   Marital status: Widowed    Spouse name: Not on file   Number of children: 1   Years of education: Not on file   Highest education level: Not on file  Occupational History   Occupation: Retired  Tobacco Use   Smoking status: Former    Types: Cigarettes    Quit date: 04/24/2000    Years since quitting: 20.8   Smokeless tobacco: Never  Vaping Use   Vaping Use: Never used  Substance and Sexual Activity   Alcohol use: Not Currently    Comment: has previous hx of ETOH abuse, quit 2014   Drug use: No   Sexual activity: Never  Other Topics Concern   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[place]   Grew up in [place]   Has daughter locally   Social Determinants of Corporate investment banker Strain: Low Risk    Difficulty of Paying Living Expenses: Not very hard  Food Insecurity: No Food Insecurity   Worried About Programme researcher, broadcasting/film/video in the Last Year: Never true   Barista in the Last Year: Never true  Transportation Needs: Personal assistant (Medical): No   Lack of Transportation (Non-Medical): Yes  Physical Activity: Not on file  Stress: Not on file  Social Connections: Not on file  Intimate Partner Violence: Not on file    Outpatient Medications Prior to Visit  Medication Sig Dispense Refill   albuterol (PROVENTIL) (2.5 MG/3ML) 0.083% nebulizer solution USE 1 VIAL IN NEBULIZER EVERY 6 HOURS AS NEEDED FOR WHEEZING AND FOR SHORTNESS OF BREATH 150 mL 0   allopurinol (ZYLOPRIM) 100 MG tablet Take 2 tablets (200 mg total) by mouth daily. 180 tablet 1   aspirin 81 MG EC tablet Take 1 tablet (81 mg total) by mouth daily. Swallow whole. 30 tablet 12   atorvastatin (LIPITOR) 80 MG tablet Take 1 tablet (80  mg total) by mouth daily. 90 tablet 3   colchicine 0.6 MG tablet Take 0.6 mg by mouth daily as needed (gout). (Patient not taking: Reported on 02/13/2021)     diazepam (VALIUM) 10 MG tablet TAKE ONE TABLET BY MOUTH EVERY TWELVE HOURS AS NEEDED FOR ANXIETY 45 tablet 0   dicyclomine (BENTYL) 10 MG capsule Take 1 capsule (10 mg total) by mouth in the morning, at noon, in the evening, and at bedtime. 360 capsule 1   escitalopram (LEXAPRO) 20 MG tablet TAKE ONE TABLET BY MOUTH ONCE  DAILY 90 tablet 1   ezetimibe (ZETIA) 10 MG tablet Take 1 tablet (10 mg total) by mouth daily. 90 tablet 3   famotidine (PEPCID) 20 MG tablet Take 1 tablet (20 mg total) by mouth 2 (two) times daily. 60 tablet 3   isosorbide mononitrate (IMDUR) 30 MG 24 hr tablet Take 0.5 tablets (15 mg total) by mouth daily. 45 tablet 3   levothyroxine (EUTHYROX) 25 MCG tablet TAKE 1 TABLET BY MOUTH ONCE DAILY BEFORE BREAKFAST 90 tablet 1   lisinopril (ZESTRIL) 2.5 MG tablet Take 1 tablet (2.5 mg total) by mouth daily. 90 tablet 3   meclizine (ANTIVERT) 25 MG tablet TAKE ONE TABLET BY MOUTH TWICE DAILY AS NEEDED FOR dizziness 30 tablet 1   metoprolol succinate (TOPROL-XL) 25 MG 24 hr tablet Take 0.5 tablets (12.5 mg total) by mouth daily. 45 tablet 3   nitroGLYCERIN (NITROSTAT) 0.4 MG SL tablet Place 1 tablet (0.4 mg total) under the tongue every 5 (five) minutes as needed for chest pain. 45 tablet 3   nystatin (NYSTATIN) powder APPLY  POWDER TOPICALLY TO AFFECTED AREA 2 TIMES DAILY (Patient taking differently: APPLY  POWDER TOPICALLY TO AFFECTED AREA 2 TIMES DAILY) 60 g 1   pantoprazole (PROTONIX) 40 MG tablet Take 1 tablet (40 mg total) by mouth 2 (two) times daily. 180 tablet 0   potassium chloride SA (KLOR-CON) 20 MEQ tablet Take 1 tablet (20 mEq total) by mouth daily. 90 tablet 1   sucralfate (CARAFATE) 1 g tablet TAKE ONE TABLET BY MOUTH FOUR TIMES DAILY. MAY cut pill in half AND DISSOLVE in water prior TO drinking (Patient taking  differently: Take 0.5 g by mouth 4 (four) times daily -  with meals and at bedtime.) 120 tablet 0   No facility-administered medications prior to visit.    Allergies  Allergen Reactions   Ibuprofen Other (See Comments)    Pt has hx of peptic ulcer and diverticulitis.     Review of Systems  Respiratory:  Positive for wheezing (at night).   Gastrointestinal:  Positive for abdominal pain (upper).      Objective:    Physical Exam Constitutional:      General: She is not in acute distress.    Appearance: Normal appearance. She is not ill-appearing.  HENT:     Head: Normocephalic and atraumatic.     Right Ear: External ear normal.     Left Ear: External ear normal.  Eyes:     Extraocular Movements: Extraocular movements intact.     Pupils: Pupils are equal, round, and reactive to light.  Cardiovascular:     Rate and Rhythm: Normal rate and regular rhythm.     Heart sounds: Normal heart sounds. No murmur heard.   No gallop.  Pulmonary:     Effort: Pulmonary effort is normal. No respiratory distress.     Breath sounds: Normal breath sounds. No wheezing or rales.  Abdominal:     Palpations: Abdomen is soft.     Tenderness: There is no abdominal tenderness.  Skin:    General: Skin is warm and dry.  Neurological:     Mental Status: She is alert and oriented to person, place, and time.  Psychiatric:        Behavior: Behavior normal.    BP (!) 102/56 (BP Location: Right Arm, Patient Position: Sitting, Cuff Size: Small)   Pulse 82   Temp 97.9 F (36.6 C) (Oral)   Resp 16   Ht 5\' 4"  (1.626 m)  Wt 182 lb (82.6 kg)   SpO2 97%   BMI 31.24 kg/m  Wt Readings from Last 3 Encounters:  02/16/21 182 lb (82.6 kg)  01/09/21 189 lb (85.7 kg)  12/03/20 188 lb (85.3 kg)       Assessment & Plan:   Problem List Items Addressed This Visit       Unprioritized   Prediabetes    Lab Results  Component Value Date   HGBA1C 6.5 01/09/2021  Discussed diabetic diet.        Relevant Orders   Urine Microalbumin w/creat. ratio   Hypertension    BP Readings from Last 3 Encounters:  02/16/21 (!) 102/56  01/09/21 (!) 143/93  12/03/20 (!) 110/55  BP is a bit low.  Will monitor for now.         Gout    Continue allopurinol 200mg  once daily. Stable.       Epigastric pain    Chronic.  Neg/US, no tenderness on exam today. I advised pt to schedule follow up with her GI.       Anxiety - Primary    Stable on lexapro and valium. Continue same.       Relevant Orders   DRUG MONITORING, PANEL 8 WITH CONFIRMATION, URINE     No orders of the defined types were placed in this encounter.   I, NP, personally preformed the services described in this documentation.  All medical record entries made by the scribe were at my direction and in my presence.  I have reviewed the chart and discharge instructions (if applicable) and agree that the record reflects my personal performance and is accurate and complete. 02/16/2021   I,[name],acting as a 02/18/2021 for Neurosurgeon, NP.,have documented all relevant documentation on the behalf of Lemont Fillers, NP,as directed by  Lemont Fillers, NP while in the presence of Lemont Fillers, NP.   Lemont Fillers, NP

## 2021-02-16 NOTE — Assessment & Plan Note (Addendum)
Stable on lexapro and valium. Continue same.

## 2021-02-16 NOTE — Telephone Encounter (Signed)
Pt states she is having a rash again, pt informed she has already been given rx for this but threw away the container. Pt stated this was just a rash and no other symptoms. Please advise.

## 2021-02-16 NOTE — Telephone Encounter (Signed)
Where is her rash?  Is it under her breasts and stomach? If so, I will send nystatin powder.

## 2021-02-16 NOTE — Patient Instructions (Addendum)
Please schedule a follow up visit with your GI doctor.  [phone]

## 2021-02-16 NOTE — Assessment & Plan Note (Addendum)
BP Readings from Last 3 Encounters:  02/16/21 (!) 102/56  01/09/21 (!) 143/93  12/03/20 (!) 110/55   BP is a bit low.  Will monitor for now.

## 2021-02-16 NOTE — Assessment & Plan Note (Signed)
Continue allopurinol 200mg  once daily. Stable.

## 2021-02-16 NOTE — Assessment & Plan Note (Deleted)
Will rx with prednisone taper.  Continue albuterol as needed.

## 2021-02-16 NOTE — Progress Notes (Deleted)
Subjective:   By signing my name below, I, [name], attest that this documentation has been prepared under the direction and in the presence of [name] NP. 02/16/2021    Patient ID: Veronica Wolf, female    DOB: 17-Mar-1941, 80 y.o.   MRN: [id number]  Chief Complaint  Patient presents with   Abdominal Pain    Patient reports she is still having abdominal pain    HPI Patient is in today for a office visit. She is present with her daughter during this visit.   Stomach pain- She complains of mild stomach pain after waking up this morning. She thinks it may be her medication. The pain starts from her chest and goes down. She completed a [place] which showed nothing new. She has seen a GI specialist and completed a endoscopy which showed a hiatal hernia. They also widened a narrowing in her esophagus. She is interested in seeing a GI specialist to manage her pain.  Diabetes- Her last A1c showed she was borderline diabetic. She is requesting a diet to help manage it.  Sleep- She complains of waking up late. She completes at least 8 hours of sleep but sleeps late around 1-2 pm.  Blood pressure- Her blood pressure is slightly low during this visit. She reports having mild occasional dizziness. She continues taking 2.5 mg lisinopril daily PO, 25 mg metoprolol succinate daily PO and reports no new issues while taking it.   BP Readings from Last 3 Encounters:  02/16/21 (!) 102/56  01/09/21 (!) 143/93  12/03/20 (!) 110/55   Pulse Readings from Last 3 Encounters:  02/16/21 82  01/09/21 88  12/03/20 87   Gout- She has no recent flare ups of gout. She continues taking 200 mg allopurinol daily PO.  Breathing- She reports having mild wheezing at night and is using her inhaler to manage her symptoms and finds relief.  Transportation- She is requesting to connect with services to help her reach her appointments safely.    Health Maintenance Due  Topic Date Due   Zoster Vaccines-  Shingrix (1 of 2) Never done   COVID-19 Vaccine (3 - Pfizer risk series) 07/06/2019    Past Medical History:  Diagnosis Date   Allergic rhinitis    Allergy    seasonal allergies   Anxiety    CKD (chronic kidney disease) 04/04/2017   COPD (chronic obstructive pulmonary disease) (HCC)    uses inhaler   Depression    Fatty liver    GERD (gastroesophageal reflux disease)    Gout    hx of   Hyperlipidemia    on meds   Hypertension    on meds   Myocardial infarction [facility])    Peptic ulcer 01/04/2003   Vertigo     Past Surgical History:  Procedure Laterality Date   ABDOMINAL HYSTERECTOMY     APPENDECTOMY     BIOPSY  08/13/2020   Procedure: BIOPSY;  Surgeon: Shellia Cleverly, DO;  Location: [facility];  Service: Gastroenterology;;   ESOPHAGOGASTRODUODENOSCOPY (EGD) WITH PROPOFOL N/A 08/13/2020   Procedure: ESOPHAGOGASTRODUODENOSCOPY (EGD) WITH PROPOFOL;  Surgeon: Shellia Cleverly, DO;  Location: [facility];  Service: Gastroenterology;  Laterality: N/A;   LEFT HEART CATH AND CORONARY ANGIOGRAPHY N/A 06/13/2017   Procedure: LEFT HEART CATH AND CORONARY ANGIOGRAPHY;  Surgeon: Marykay Lex, MD;  Location: [facility] INVASIVE CV LAB;  Service: Cardiovascular;  Laterality: N/A;   RIGHT/LEFT HEART CATH AND CORONARY ANGIOGRAPHY N/A 09/16/2017   Procedure: RIGHT/LEFT HEART  CATH AND CORONARY ANGIOGRAPHY;  Surgeon: Swaziland, Peter M, MD;  Location: [facility] INVASIVE CV LAB;  Service: Cardiovascular;  Laterality: N/A;   SAVORY DILATION N/A 08/13/2020   Procedure: SAVORY DILATION;  Surgeon: Shellia Cleverly, DO;  Location: [facility];  Service: Gastroenterology;  Laterality: N/A;   ULTRASOUND GUIDANCE FOR VASCULAR ACCESS  09/16/2017   Procedure: Ultrasound Guidance For Vascular Access;  Surgeon: Swaziland, Peter M, MD;  Location: [facility] INVASIVE CV LAB;  Service: Cardiovascular;;   WISDOM TOOTH EXTRACTION      Family History  Problem Relation Age of Onset   Breast cancer Mother    Stroke Father     Stomach cancer Neg Hx    Colon cancer Neg Hx    Pancreatic cancer Neg Hx    Esophageal cancer Neg Hx    Colon polyps Neg Hx    Rectal cancer Neg Hx     Social History   Socioeconomic History   Marital status: Widowed    Spouse name: Not on file   Number of children: 1   Years of education: Not on file   Highest education level: Not on file  Occupational History   Occupation: Retired  Tobacco Use   Smoking status: Former    Types: Cigarettes    Quit date: 04/24/2000    Years since quitting: 20.8   Smokeless tobacco: Never  Vaping Use   Vaping Use: Never used  Substance and Sexual Activity   Alcohol use: Not Currently    Comment: has previous hx of ETOH abuse, quit 2014   Drug use: No   Sexual activity: Never  Other Topics Concern   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[place]   Grew up in [place]   Has daughter locally   Social Determinants of Corporate investment banker Strain: Low Risk    Difficulty of Paying Living Expenses: Not very hard  Food Insecurity: No Food Insecurity   Worried About Programme researcher, broadcasting/film/video in the Last Year: Never true   Barista in the Last Year: Never true  Transportation Needs: Personal assistant (Medical): No   Lack of Transportation (Non-Medical): Yes  Physical Activity: Not on file  Stress: Not on file  Social Connections: Not on file  Intimate Partner Violence: Not on file    Outpatient Medications Prior to Visit  Medication Sig Dispense Refill   albuterol (PROVENTIL) (2.5 MG/3ML) 0.083% nebulizer solution USE 1 VIAL IN NEBULIZER EVERY 6 HOURS AS NEEDED FOR WHEEZING AND FOR SHORTNESS OF BREATH 150 mL 0   allopurinol (ZYLOPRIM) 100 MG tablet Take 2 tablets (200 mg total) by mouth daily. 180 tablet 1   aspirin 81 MG EC tablet Take 1 tablet (81 mg total) by mouth daily. Swallow whole. 30 tablet 12   atorvastatin (LIPITOR) 80 MG tablet Take 1 tablet (80  mg total) by mouth daily. 90 tablet 3   colchicine 0.6 MG tablet Take 0.6 mg by mouth daily as needed (gout). (Patient not taking: Reported on 02/13/2021)     diazepam (VALIUM) 10 MG tablet TAKE ONE TABLET BY MOUTH EVERY TWELVE HOURS AS NEEDED FOR ANXIETY 45 tablet 0   dicyclomine (BENTYL) 10 MG capsule Take 1 capsule (10 mg total) by mouth in the morning, at noon, in the evening, and at bedtime. 360 capsule 1   escitalopram (LEXAPRO) 20 MG tablet TAKE ONE TABLET BY MOUTH ONCE  DAILY 90 tablet 1   ezetimibe (ZETIA) 10 MG tablet Take 1 tablet (10 mg total) by mouth daily. 90 tablet 3   famotidine (PEPCID) 20 MG tablet Take 1 tablet (20 mg total) by mouth 2 (two) times daily. 60 tablet 3   isosorbide mononitrate (IMDUR) 30 MG 24 hr tablet Take 0.5 tablets (15 mg total) by mouth daily. 45 tablet 3   levothyroxine (EUTHYROX) 25 MCG tablet TAKE 1 TABLET BY MOUTH ONCE DAILY BEFORE BREAKFAST 90 tablet 1   lisinopril (ZESTRIL) 2.5 MG tablet Take 1 tablet (2.5 mg total) by mouth daily. 90 tablet 3   meclizine (ANTIVERT) 25 MG tablet TAKE ONE TABLET BY MOUTH TWICE DAILY AS NEEDED FOR dizziness 30 tablet 1   metoprolol succinate (TOPROL-XL) 25 MG 24 hr tablet Take 0.5 tablets (12.5 mg total) by mouth daily. 45 tablet 3   nitroGLYCERIN (NITROSTAT) 0.4 MG SL tablet Place 1 tablet (0.4 mg total) under the tongue every 5 (five) minutes as needed for chest pain. 45 tablet 3   nystatin (NYSTATIN) powder APPLY  POWDER TOPICALLY TO AFFECTED AREA 2 TIMES DAILY (Patient taking differently: APPLY  POWDER TOPICALLY TO AFFECTED AREA 2 TIMES DAILY) 60 g 1   pantoprazole (PROTONIX) 40 MG tablet Take 1 tablet (40 mg total) by mouth 2 (two) times daily. 180 tablet 0   potassium chloride SA (KLOR-CON) 20 MEQ tablet Take 1 tablet (20 mEq total) by mouth daily. 90 tablet 1   sucralfate (CARAFATE) 1 g tablet TAKE ONE TABLET BY MOUTH FOUR TIMES DAILY. MAY cut pill in half AND DISSOLVE in water prior TO drinking (Patient taking  differently: Take 0.5 g by mouth 4 (four) times daily -  with meals and at bedtime.) 120 tablet 0   No facility-administered medications prior to visit.    Allergies  Allergen Reactions   Ibuprofen Other (See Comments)    Pt has hx of peptic ulcer and diverticulitis.     Review of Systems  Respiratory:  Positive for wheezing (at night).   Gastrointestinal:  Positive for abdominal pain (upper).      Objective:    Physical Exam Constitutional:      General: She is not in acute distress.    Appearance: Normal appearance. She is not ill-appearing.  HENT:     Head: Normocephalic and atraumatic.     Right Ear: External ear normal.     Left Ear: External ear normal.  Eyes:     Extraocular Movements: Extraocular movements intact.     Pupils: Pupils are equal, round, and reactive to light.  Cardiovascular:     Rate and Rhythm: Normal rate and regular rhythm.     Heart sounds: Normal heart sounds. No murmur heard.   No gallop.  Pulmonary:     Effort: Pulmonary effort is normal. No respiratory distress.     Breath sounds: Normal breath sounds. No wheezing or rales.  Abdominal:     Palpations: Abdomen is soft.     Tenderness: There is no abdominal tenderness.  Skin:    General: Skin is warm and dry.  Neurological:     Mental Status: She is alert and oriented to person, place, and time.  Psychiatric:        Behavior: Behavior normal.    BP (!) 102/56 (BP Location: Right Arm, Patient Position: Sitting, Cuff Size: Small)   Pulse 82   Temp 97.9 F (36.6 C) (Oral)   Resp 16   Ht 5\' 4"  (1.626 m)  Wt 182 lb (82.6 kg)   SpO2 97%   BMI 31.24 kg/m  Wt Readings from Last 3 Encounters:  02/16/21 182 lb (82.6 kg)  01/09/21 189 lb (85.7 kg)  12/03/20 188 lb (85.3 kg)       Assessment & Plan:   Problem List Items Addressed This Visit       Unprioritized   Prediabetes    Lab Results  Component Value Date   HGBA1C 6.5 01/09/2021  Discussed diabetic diet.        Relevant Orders   Urine Microalbumin w/creat. ratio   Left otitis media    New. Will rx with amoxicillin.       Hypertension    BP Readings from Last 3 Encounters:  02/16/21 (!) 102/56  01/09/21 (!) 143/93  12/03/20 (!) 110/55  BP is a bit low.  Will monitor for now.         Gout    Continue allopurinol 200mg  once daily. Stable.       Epigastric pain    Chronic.  Neg/US, no tenderness on exam today. I advised pt to schedule follow up with her GI.       COPD exacerbation (HCC)    Will rx with prednisone taper.  Continue albuterol as needed.      Anxiety - Primary    Stable on lexapro and valium. Will update controlled substance contract.      Relevant Orders   DRUG MONITORING, PANEL 8 WITH CONFIRMATION, URINE     No orders of the defined types were placed in this encounter.   I, [name] NP, personally preformed the services described in this documentation.  All medical record entries made by the scribe were at my direction and in my presence.  I have reviewed the chart and discharge instructions (if applicable) and agree that the record reflects my personal performance and is accurate and complete. 02/16/2021   I,[name],acting as a Neurosurgeon for Lemont Fillers, NP.,have documented all relevant documentation on the behalf of Lemont Fillers, NP,as directed by  Lemont Fillers, NP while in the presence of Lemont Fillers, NP.   Lemont Fillers, NP

## 2021-02-16 NOTE — Assessment & Plan Note (Signed)
Chronic.  Neg/US, no tenderness on exam today. I advised pt to schedule follow up with her GI.

## 2021-02-17 ENCOUNTER — Other Ambulatory Visit: Payer: Self-pay

## 2021-02-17 DIAGNOSIS — J449 Chronic obstructive pulmonary disease, unspecified: Secondary | ICD-10-CM

## 2021-02-17 MED ORDER — NYSTATIN 100000 UNIT/GM EX POWD: CUTANEOUS | 1 refills | Status: DC

## 2021-02-17 NOTE — Telephone Encounter (Signed)
Diabetic footwear company continues to call stating that pt requested footwear. I informed them that pt did not request footwear. They insist that she did. I informed them to refax paperwork and we will move forward from there. He stated we already have the fax so he will not refax it and we need to fax it back to them then hung up.

## 2021-02-19 ENCOUNTER — Other Ambulatory Visit: Payer: Self-pay | Admitting: Family

## 2021-02-23 ENCOUNTER — Telehealth: Payer: Self-pay | Admitting: Family

## 2021-02-23 NOTE — Telephone Encounter (Signed)
Patient advised and scheduled to come in tomorrow for labs. Orders changed to future

## 2021-02-23 NOTE — Telephone Encounter (Signed)
It looks like she forgot to go to the lab after her visit. Please contact pt to schedule a lab appointment.

## 2021-02-23 NOTE — Addendum Note (Signed)
Addended by: Wilford Corner on: 02/23/2021 09:56 AM   Modules accepted: Orders

## 2021-02-24 ENCOUNTER — Telehealth: Payer: Self-pay | Admitting: Family

## 2021-02-24 ENCOUNTER — Other Ambulatory Visit: Payer: Medicare HMO

## 2021-02-24 ENCOUNTER — Other Ambulatory Visit: Payer: Self-pay

## 2021-02-24 ENCOUNTER — Telehealth: Payer: Self-pay | Admitting: *Deleted

## 2021-02-24 DIAGNOSIS — F419 Anxiety disorder, unspecified: Secondary | ICD-10-CM

## 2021-02-24 DIAGNOSIS — Z79899 Other long term (current) drug therapy: Secondary | ICD-10-CM

## 2021-02-24 DIAGNOSIS — R7303 Prediabetes: Secondary | ICD-10-CM

## 2021-02-24 NOTE — Telephone Encounter (Signed)
Noted.  Please advise pt that we will not be able to provide valium refilss until she completes the UDS in the office.

## 2021-02-24 NOTE — Addendum Note (Signed)
Addended by: Mervin Kung A on: 02/24/2021 04:23 PM   Modules accepted: Orders

## 2021-02-24 NOTE — Telephone Encounter (Signed)
Pt family member dropped off urine on behalf of patient. He stated that it was not labelled but provided patient's name. I opened the bag and there was urine in a cup that was from [place] and still had the sticker on it from '[employer]'.  I called patient to explain that we are not able to accept the urine that was brought in today. UDS was ordered and cannot be collected at home.  Pt will need to schedule a lab appointment to give a urine sample in person in the office. Pt refused to schedule another lab appointment at this time.  Please advise.

## 2021-02-24 NOTE — Telephone Encounter (Signed)
Pt stated she has not been feeling well and will be dropping off the sample as soon as she can, she stated she already had the stuff to do the sample and just needed to drop it off. Please advise.

## 2021-02-24 NOTE — Telephone Encounter (Signed)
Patient dropped off specimen in a used store container, lab not able to process specimen and notified patient

## 2021-02-24 NOTE — Progress Notes (Signed)
Pt family member dropped off urine on behalf of patient. He stated that it was not labelled but provided patient's name. I opened the bag and there was urine in a cup that was from [place] and still had the sticker on it from '[employer]'.  I called patient to explain that we are not able to accept the urine that was brought in today. UDS was ordered and cannot be collected at home.  Pt will need to schedule a lab appointment to give a urine sample in person in the office. Pt refused to schedule another lab appointment. Notified PCP and CMA.

## 2021-03-02 ENCOUNTER — Other Ambulatory Visit: Payer: Self-pay | Admitting: Family

## 2021-03-02 ENCOUNTER — Telehealth: Payer: Self-pay | Admitting: Pharmacist

## 2021-03-02 NOTE — Progress Notes (Signed)
Chronic Care Management Pharmacy Assistant   Name: Veronica Wolf  MRN: [id number] DOB: 08-08-1940  .  Reason for Encounter: Medication Review    Recent office visits:  02/16/21 [name], NP-Internal Medicine (Abdominal pain) orders: urine microalbumin, no med changes  Recent consult visits:  None ID  Hospital visits:  None in previous 6 months  Medications: Outpatient Encounter Medications as of 03/02/2021  Medication Sig   albuterol (PROVENTIL) (2.5 MG/3ML) 0.083% nebulizer solution USE 1 VIAL IN NEBULIZER EVERY 6 HOURS AS NEEDED FOR WHEEZING AND FOR SHORTNESS OF BREATH   allopurinol (ZYLOPRIM) 100 MG tablet TAKE TWO TABLETS BY MOUTH ONCE DAILY   aspirin 81 MG EC tablet Take 1 tablet (81 mg total) by mouth daily. Swallow whole.   atorvastatin (LIPITOR) 80 MG tablet Take 1 tablet (80 mg total) by mouth daily.   colchicine 0.6 MG tablet Take 0.6 mg by mouth daily as needed (gout). (Patient not taking: Reported on 02/13/2021)   diazepam (VALIUM) 10 MG tablet TAKE ONE TABLET BY MOUTH EVERY TWELVE HOURS AS NEEDED FOR ANXIETY   dicyclomine (BENTYL) 10 MG capsule Take 1 capsule (10 mg total) by mouth in the morning, at noon, in the evening, and at bedtime.   escitalopram (LEXAPRO) 20 MG tablet TAKE ONE TABLET BY MOUTH ONCE DAILY   ezetimibe (ZETIA) 10 MG tablet Take 1 tablet (10 mg total) by mouth daily.   famotidine (PEPCID) 20 MG tablet Take 1 tablet (20 mg total) by mouth 2 (two) times daily.   isosorbide mononitrate (IMDUR) 30 MG 24 hr tablet Take 0.5 tablets (15 mg total) by mouth daily.   levothyroxine (SYNTHROID) 25 MCG tablet TAKE ONE TABLET BY MOUTH EVERY MORNING   lisinopril (ZESTRIL) 2.5 MG tablet Take 1 tablet (2.5 mg total) by mouth daily.   meclizine (ANTIVERT) 25 MG tablet TAKE ONE TABLET BY MOUTH TWICE DAILY AS NEEDED FOR dizziness   metoprolol succinate (TOPROL-XL) 25 MG 24 hr tablet Take 0.5 tablets (12.5 mg total) by mouth daily.   nitroGLYCERIN  (NITROSTAT) 0.4 MG SL tablet Place 1 tablet (0.4 mg total) under the tongue every 5 (five) minutes as needed for chest pain.   nystatin powder APPLY  POWDER TOPICALLY TO AFFECTED AREA 2 TIMES DAILY   pantoprazole (PROTONIX) 40 MG tablet Take 1 tablet (40 mg total) by mouth 2 (two) times daily.   potassium chloride SA (KLOR-CON) 20 MEQ tablet Take 1 tablet (20 mEq total) by mouth daily.   sucralfate (CARAFATE) 1 g tablet TAKE ONE TABLET BY MOUTH FOUR TIMES DAILY. MAY cut pill in half AND DISSOLVE in water prior TO drinking (Patient taking differently: Take 0.5 g by mouth 4 (four) times daily -  with meals and at bedtime.)   No facility-administered encounter medications on file as of 03/02/2021.    Reviewed chart for medication changes ahead of medication coordination call.  No OVs, Consults, or hospital visits since last care coordination call/Pharmacist visit. (If appropriate, list visit date, provider name)  No medication changes indicated OR if recent visit, treatment plan here.  BP Readings from Last 3 Encounters:  02/16/21 (!) 102/56  01/09/21 (!) 143/93  12/03/20 (!) 110/55    Lab Results  Component Value Date   HGBA1C 6.5 01/09/2021     Patient obtains medications through Adherence Packaging  30 Days   Last adherence delivery included:  Levothyroxine 25 mcg 1 tab at breakfast Allopurinol 100 mg 2 tabs breakfast Escitalopram 20 mg 1 tab breakfast  Atorvastatin 80 mg 1 tab at bedtime Lisinopril 2.5 mg 1 tab at bedtime Ezetimibe 10 mg 1 tab breakfast Metoprolol 25 mg 1/2 tab every evening Dicyclomine 10 mg 1 cap 4x daily, breakfast,lunch,evening and bedtime Nitroglycern sub 0.4 mg place 1 tab under the tongue every 5 mins Potassium Cl 20 meq 1 tab at Lunch Sucralfate 1 gram 1/2 tab 4x daily as needed Pantoprazole 40 mg 1 tab twice daily, breakfast and evening Diazepam 10 mg 1 tab every twelve hours as needed   Patient is due for next adherence delivery on:  03/06/21.  Called patient and reviewed medications and coordinated delivery. This delivery to include: Levothyroxine 25 mcg 1 tab at breakfast Allopurinol 100 mg 2 tabs breakfast Escitalopram 20 mg 1 tab breakfast Atorvastatin 80 mg 1 tab at bedtime Lisinopril 2.5 mg 1 tab at bedtime Ezetimibe 10 mg 1 tab breakfast Metoprolol 25 mg 1/2 tab every evening Dicyclomine 10 mg 1 cap 4x daily, breakfast,lunch,evening and bedtime Nitroglycern sub 0.4 mg place 1 tab under the tongue every 5 mins Potassium Cl 20 meq 1 tab at Lunch Sucralfate 1 gram 1/2 tab 4x daily as needed Pantoprazole 40 mg 1 tab twice daily, breakfast and evening Diazepam 10 mg 1 tab every twelve hours as needed Patient will need a short fill of (med), prior to adherence delivery. (To align with sync date or if PRN med)  Patient needs refills for none noted.  Confirmed delivery date of 03/06/21, advised patient that pharmacy will contact them the morning of delivery.    Velvet Bathe Clinical Pharmacist Assistant [phone]

## 2021-03-02 NOTE — Telephone Encounter (Signed)
Patient was advised she needs to complete UDS, we where not able to process specimen. She was scheduled to come in tomorrow am

## 2021-03-02 NOTE — Telephone Encounter (Signed)
Requesting: diazepam Contract:  UDS: 02/24/21 Last Visit: 02/16/21 Next Visit: none Last Refill: 02/06/21  Please Advise

## 2021-03-03 ENCOUNTER — Ambulatory Visit: Payer: Medicare HMO | Admitting: Pharmacist

## 2021-03-03 ENCOUNTER — Other Ambulatory Visit: Payer: Medicare HMO

## 2021-03-03 DIAGNOSIS — R7303 Prediabetes: Secondary | ICD-10-CM

## 2021-03-03 DIAGNOSIS — F419 Anxiety disorder, unspecified: Secondary | ICD-10-CM

## 2021-03-03 DIAGNOSIS — F418 Other specified anxiety disorders: Secondary | ICD-10-CM

## 2021-03-03 DIAGNOSIS — I10 Essential (primary) hypertension: Secondary | ICD-10-CM

## 2021-03-03 DIAGNOSIS — E785 Hyperlipidemia, unspecified: Secondary | ICD-10-CM

## 2021-03-03 DIAGNOSIS — M109 Gout, unspecified: Secondary | ICD-10-CM

## 2021-03-03 DIAGNOSIS — R1310 Dysphagia, unspecified: Secondary | ICD-10-CM

## 2021-03-03 MED ORDER — FAMOTIDINE 20 MG PO TABS: 20.0000 mg | ORAL_TABLET | Freq: Two times a day (BID) | ORAL | 2 refills | Status: DC

## 2021-03-03 NOTE — Telephone Encounter (Signed)
Reviewed medication list from CMA. Noted that Aspirin 81mg  daily, isosorbide mononitrate 30mg  - take 0.5 tablet daily and famotidine 20mg  twice a day.  and spoke with technician, Chasity.  Confirmed that Aspirin and isosorbide would be included in her packaging.  Will also send in Rx for famotidine 20mg  twice a day to be added to packaging. (See notes from Chronic Care Management phone call today)

## 2021-03-03 NOTE — Patient Instructions (Signed)
Veronica Wolf It was a pleasure speaking with you today.  I have attached a summary of our visit today and information about your health goals.   Our next appointment is by telephone on March 25, 2021 at 1:45pm  Please call the care guide team at [phone] if you need to cancel or reschedule your appointment.   If you have any questions or concerns, please feel free to contact me either at the phone number below or with a MyChart message.     [name], PharmD Clinical Pharmacist [facility] [phone] (direct line)  [phone] (main office number)   CARE PLAN ENTRY  Hypertension Screening BP Readings from Last 3 Encounters:  02/16/21 (!) 102/56  01/09/21 (!) 143/93  12/03/20 (!) 110/55   Pharmacist Clinical Goal(s): Over the next 90 days, patient will work with PharmD and providers to achieve BP goal <130/80 Current regimen:  Metoprolol Succinate 25mg  0.5 tab daily Lisinopril 2.5mg  daily Interventions: Discussed blood pressure goal Discussed importance of taking metoprolol and lisinopril for heart health Patient self care activities - Over the next 90 days, patient will: Maintain blood pressure less than 130/80 Consider purchasing blood pressure cuff and check blood pressure 2 to 3 times per week and record Continue current medications    Hyperlipidemia / Heart health Lab Results  Component Value Date/Time   LDLCALC 125 (H) 08/18/2020 12:03 PM   LDLCALC 108 (H) 03/14/2020 02:39 PM   Pharmacist Clinical Goal(s): Over the next 90 days, patient will work with PharmD and providers to achieve LDL goal < 70 Current regimen:  Atorvastatin 80mg  daily  Ezetimibe 10mg  daily Aspirin 81mg  daily  Metoprolol succinate ER 25mg  - take 0.5 tablet daily Nitroglycerin 0.4mg  - place 1 tablet under the tongue as needed for chest pain, may repeat in 5 minutes if chest pain not relieved.  Interventions: Discussed LDL goal and importance of  medication adherence Repeat LDL at next office visit since ezetimibe started Discussed adherence Patient self care activities - Over the next 90 days, patient will: Continue medications currently prescribed to lower cholesterol and to prevent heart attack Rescheduled appointment with cardiologist  Anxiety / Depression:  Pharmacist Clinical Goal(s): Over the next 45 days, patient will work with PharmD and providers to maintain control of anxiety and depression symptoms Current regimen:  Escitalopram 20mg  daily  Diazepam 10mg  take 1 tablet every 12 hours if needed for anxiety Interventions: Discussed using diazepam only if needed. Limit use during daytime if able.  Patient self care activities - Over the next 90 days, patient will: Maintain current medication regimen for anxiety and depression  Remember to take diazepam only as needed.   Gout:  Pharmacist Clinical Goal(s): Over the next 90 days, patient will work with PharmD and providers to prevent acute gout episodes / lower uric acid Current regimen:  Allopurinol 100mg  - take 2 tablets = 200mg  daily (prevention) Colchicine 0.6mg  take up to twice a day as needed for gout attack Interventions: Discussed difference between maintenance / presentation medication and treatment Patient self care activities - Over the next 90 days, patient will: Maintain current medication regimen for gout  Pre-Diabetes Lab Results  Component Value Date/Time   HGBA1C 6.5 01/09/2021 08:10 AM   HGBA1C 6.2 (H) 09/17/2020 10:38 AM   Pharmacist Clinical Goal(s): Over the next 90 days, patient will work with PharmD and providers to maintain A1c goal <6.5% Current regimen:  Diet and exercise management   Interventions: Discussed the importance of  limiting carbohydrates (30-45 grams per meal) Patient self care activities - Over the next 90 days, patient will: Maintain a1c <6.5%  Hypothyroidism Pharmacist Clinical Goal(s) Over the next 90 days, patient  will work with PharmD and providers to maintain TSH within normal limits and reduce risk of symptoms associated with hypothyroidism Current regimen:  Euthyrox / levothyroxine daily Interventions: Discussed importance of medication adherence Reminded to separate Euthyrox / levothyroxine dose by 30 minutes from breakfast and morning medications Patient self care activities - Over the next 90 days, patient will: Maintain hypothyroidism medication regimen  Medication management Pharmacist Clinical Goal(s): Over the next 90 days, patient will work with PharmD and providers to achieve optimal medication adherence Current pharmacy: [facility] switched to [facility] Interventions Comprehensive medication review performed. Utilize [facility] pharmacy for medication synchronization, packaging and delivery Coordinated with pharmacy regarding adherence packaging and updated Rx for famotidine Reminded to secure all medications in your home from potential diversion or accidental ingestion.  Patient self care activities - Over the next 90 days, patient will: Focus on medication adherence by filling and taking medications appropriately  Take medications as prescribed Report any questions or concerns to PharmD and/or provider(s) Contact [name], PharmD if any issues with medications   The patient verbalized understanding of instructions, educational materials, and care plan provided today and declined offer to receive copy of patient instructions, educational materials, and care plan.

## 2021-03-03 NOTE — Chronic Care Management (AMB) (Signed)
Chronic Care Management Pharmacy Note  03/03/2021 Name:  Veronica Wolf MRN:  [id number] DOB:  [birth year]  Summary: Coordinated with pharmacy to make sure medication list for adherence packaging was correct. Updated to add famotidine. Prescription for famotidine $RemoveBefore'20mg'CQMHyniyqGPgj$  twice a day send to pharmacy.   Recommendations/Changes made from today's visit: Reminded patient to come in for UDS 03/06/21 Discussed how often she is using diazepam. Advised her to secure medication at home and to monitor her intake more closely - should try to use only if needed.   Subjective: Veronica Wolf is an 79 y.o. year old female who is a primary patient of Debbrah Alar, NP.  The CCM team was consulted for assistance with disease management and care coordination needs.    Engaged with patient by telephone for follow up visit in response to provider referral for pharmacy case management and/or care coordination services.   Consent to Services:  The patient was given information about Chronic Care Management services, agreed to services, and gave verbal consent prior to initiation of services.  Please see initial visit note for detailed documentation.   Patient Care Team: Debbrah Alar, NP as PCP - General (Internal Medicine) Revankar, Reita Cliche, MD as PCP - Cardiology (Cardiology) Michiel Cowboy, RN as [place], [place], RPH-CPP (Pharmacist)  Recent office visits: 02/16/2021 - Phone Call  to PCP office regarding rash under her breast and stomach. Debbrah Alar sent in prescription for nystatin powder.  02/16/2021 - Internal Med Inda Castle) F/U anxiety and stomach pain. Referred to GI for abdominal pain. UDS ordered. BP noted to be low - no medication changes. Continue to minitor BP.  01/09/2021 - PCP Inda Castle, NP) Seen for epigatric / chest pain. Labs ordered - CBC, BMP and lipase. Also ordered CT abdomin. No med changes noted.  12/03/2020- PCP  Inda Castle, NP) Seen for back pain. Prescribed Medrol dose pack and ordered Xay. Also checked urinalysis and recommended she complete course of ciprofloxacin given while she was in [place].  09/16/2020 - PCP Phone Call. Ultrasound showed fatty liver. Recommended follow up with GI.  09/08/2020 - PCP Inda Castle) Seen for abdominal pain and diarrhea. Hemoccult negative. Reordered Abdominal CT, LFTs and lipase, CBC. No medication changes  Recent consult visits: 09/17/2020 - Cardio (Dr Harriet Masson) CAD / intermittant chest pain. Started low dose Imdue $RemoveBe'15mg'GKoqfEvXo$  daily and ezetimibe $RemoveBefor'10mg'wtDuprVBvfBJ$  daily. Ordered nucular stress test   Hospital visits: None in the last 6 months   Objective:  Lab Results  Component Value Date   CREATININE 1.40 (H) 01/09/2021   CREATININE 1.38 (H) 08/18/2020   CREATININE 1.23 (H) 06/11/2020    Lab Results  Component Value Date   HGBA1C 6.5 01/09/2021   Last diabetic Eye exam: No results found for: HMDIABEYEEXA  Last diabetic Foot exam: No results found for: HMDIABFOOTEX      Component Value Date/Time   CHOL 204 (H) 08/18/2020 1203   TRIG 182.0 (H) 08/18/2020 1203   HDL 42.30 08/18/2020 1203   CHOLHDL 5 08/18/2020 1203   VLDL 36.4 08/18/2020 1203   LDLCALC 125 (H) 08/18/2020 1203   LDLCALC 108 (H) 03/14/2020 1439    Hepatic Function Latest Ref Rng & Units 01/09/2021 09/08/2020 08/18/2020  Total Protein 6.0 - 8.3 g/dL 6.7 6.3 6.6  Albumin 3.5 - 5.2 g/dL 3.8 3.6 3.8  AST 0 - 37 U/L $Remo'14 10 12  'giycS$ ALT 0 - 35 U/L $Remo'6 5 7  'AnEOZ$ Alk Phosphatase 39 - 117 U/L 82  64 80  Total Bilirubin 0.2 - 1.2 mg/dL 0.3 0.4 0.2  Bilirubin, Direct 0.0 - 0.3 mg/dL - 0.1 -    Lab Results  Component Value Date/Time   TSH 3.57 08/18/2020 12:03 PM   TSH 1.77 03/14/2020 02:39 PM   FREET4 0.85 01/24/2018 03:04 PM    CBC Latest Ref Rng & Units 09/08/2020 06/11/2020 01/23/2018  WBC 4.0 - 10.5 K/uL 7.3 9.1 8.3  Hemoglobin 12.0 - 15.0 g/dL 11.6(L) 11.5(L) 11.9(L)  Hematocrit 36.0 - 46.0 % 36.1 38.1 36.6   Platelets 150.0 - 400.0 K/uL 219.0 219 190.0    No results found for: VD25OH  Clinical ASCVD: Yes  The ASCVD Risk score (Arnett DK, et al., 2019) failed to calculate for the following reasons:   The 2019 ASCVD risk score is only valid for ages 75 to 73   The patient has a prior MI or stroke diagnosis    Other:  DEXA 03/05/2014             AP LUMBAR SPINE not used for calculation due to significant degenerative changes.             Left FOREARM (1/3 RADIUS) T Score: -0.4             Left FEMUR neck T-Score: -0.7     Social History   Tobacco Use  Smoking Status Former   Types: Cigarettes   Quit date: 04/24/2000   Years since quitting: 20.8  Smokeless Tobacco Never   BP Readings from Last 3 Encounters:  02/16/21 (!) 102/56  01/09/21 (!) 143/93  12/03/20 (!) 110/55   Pulse Readings from Last 3 Encounters:  02/16/21 82  01/09/21 88  12/03/20 87   Wt Readings from Last 3 Encounters:  02/16/21 182 lb (82.6 kg)  01/09/21 189 lb (85.7 kg)  12/03/20 188 lb (85.3 kg)    Assessment: Review of patient past medical history, allergies, medications, health status, including review of consultants reports, laboratory and other test data, was performed as part of comprehensive evaluation and provision of chronic care management services.   SDOH:  (Social Determinants of Health) assessments and interventions performed:     CCM Care Plan  Allergies  Allergen Reactions   Ibuprofen Other (See Comments)    Pt has hx of peptic ulcer and diverticulitis.     Medications Reviewed Today     Reviewed by Cherre Robins, RPH-CPP (Pharmacist) on 03/03/21 at 1449  Med List Status: <None>   Medication Order Taking? Sig Documenting Provider Last Dose Status Informant  albuterol (PROVENTIL) (2.5 MG/3ML) 0.083% nebulizer solution [id number] Yes USE 1 VIAL IN NEBULIZER EVERY 6 HOURS AS NEEDED FOR WHEEZING AND FOR SHORTNESS OF [name], Lenna Sciara, NP Taking Active   allopurinol (ZYLOPRIM)  100 MG tablet [id number] Yes TAKE TWO TABLETS BY MOUTH ONCE DAILY Debbrah Alar, NP Taking Active   aspirin 81 MG EC tablet [id number] Yes Take 1 tablet (81 mg total) by mouth daily. Swallow whole. Tobb, Godfrey Pick, DO Taking Active   atorvastatin (LIPITOR) 80 MG tablet [id number] Yes Take 1 tablet (80 mg total) by mouth daily. Berniece Salines, DO Taking Active   colchicine 0.6 MG tablet [id number] Yes Take 0.6 mg by mouth daily as needed (gout). [provider] Taking Active   diazepam (VALIUM) 10 MG tablet [id number] Yes TAKE ONE TABLET BY MOUTH EVERY TWELVE HOURS AS NEEDED FOR ANXIETY Debbrah Alar, NP Taking Active   dicyclomine (BENTYL) 10 MG capsule [id number] Yes Take 1 capsule (10 mg  total) by mouth in the morning, at noon, in the evening, and at bedtime. Debbrah Alar, NP Taking Active   escitalopram (LEXAPRO) 20 MG tablet [id number] Yes TAKE ONE TABLET BY MOUTH ONCE DAILY Debbrah Alar, NP Taking Active   ezetimibe (ZETIA) 10 MG tablet [id number] Yes Take 1 tablet (10 mg total) by mouth daily. Tobb, Kardie, DO Taking Active   famotidine (PEPCID) 20 MG tablet [id number] No Take 1 tablet (20 mg total) by mouth 2 (two) times daily.  Patient not taking: Reported on 03/03/2021   Lavena Bullion, DO Not Taking Active   isosorbide mononitrate (IMDUR) 30 MG 24 hr tablet [id number] Yes Take 0.5 tablets (15 mg total) by mouth daily. Berniece Salines, DO Taking Active   levothyroxine (SYNTHROID) 25 MCG tablet [id number] Yes TAKE ONE TABLET BY MOUTH EVERY MORNING Debbrah Alar, NP Taking Active   lisinopril (ZESTRIL) 2.5 MG tablet [id number] Yes Take 1 tablet (2.5 mg total) by mouth daily. Tobb, Godfrey Pick, DO Taking Active   meclizine (ANTIVERT) 25 MG tablet [id number] Yes TAKE ONE TABLET BY MOUTH TWICE DAILY AS NEEDED FOR dizziness Debbrah Alar, NP Taking Active   metoprolol succinate (TOPROL-XL) 25 MG 24 hr tablet [id number] Yes Take 0.5 tablets (12.5 mg total) by mouth daily.  Tobb, Kardie, DO Taking Active   nitroGLYCERIN (NITROSTAT) 0.4 MG SL tablet [id number] Yes Place 1 tablet (0.4 mg total) under the tongue every 5 (five) minutes as needed for chest pain. Berniece Salines, DO Taking Active   nystatin powder [id number] Yes APPLY  POWDER TOPICALLY TO AFFECTED AREA 2 TIMES DAILY Debbrah Alar, NP Taking Active   pantoprazole (PROTONIX) 40 MG tablet [id number] Yes TAKE ONE TABLET BY MOUTH TWICE DAILY Debbrah Alar, NP Taking Active   potassium chloride SA (KLOR-CON) 20 MEQ tablet [id number] Yes Take 1 tablet (20 mEq total) by mouth daily. Debbrah Alar, NP Taking Active   sucralfate (CARAFATE) 1 g tablet [id number] Yes TAKE ONE TABLET BY MOUTH FOUR TIMES DAILY. MAY cut pill in half AND DISSOLVE in water prior TO drinking  Patient taking differently: Take 0.5 g by mouth 4 (four) times daily -  with meals and at bedtime.   Lavena Bullion, DO Taking Active             Patient Active Problem List   Diagnosis Date Noted   Left otitis media 02/16/2021   Back pain 12/03/2020   Acute cystitis without hematuria 12/03/2020   Grief reaction 12/03/2020   Fatty liver 12/01/2020   Other chest pain 09/17/2020   Coronary artery disease involving native coronary artery of native heart 36/14/4315   Metabolic syndrome 40/11/6759   Prediabetes 09/17/2020   Allergy 09/16/2020   Anxiety 09/16/2020   Hypertension 09/16/2020   Vertigo 09/16/2020   Dark stools 09/08/2020   Dysphagia    Esophageal stricture    Hiatal hernia    Epigastric pain    Hypothyroid 01/25/2018   Mild CAD 10/03/2017   Stress-induced cardiomyopathy 09/19/2017   COPD exacerbation ([place]) 09/14/2017   Hypokalemia 09/14/2017   Non-ST elevation (NSTEMI) myocardial infarction [facility]) - due to Demand Infarction 09/14/2017   CKD (chronic kidney disease) 04/04/2017   HLD (hyperlipidemia)    Allergic rhinitis    Gout 02/28/2017   Depression with anxiety 02/28/2017   Epigastric abdominal pain  12/22/2016   COPD with chronic bronchitis ([place]) 12/22/2016   GERD (gastroesophageal reflux disease) 12/22/2016   DOE (dyspnea on exertion) 12/22/2016   Peptic ulcer 01/04/2003    Immunization History  Administered Date(s) Administered   Fluad Quad(high Dose 65+) 03/14/2020, 12/03/2020   Influenza, High Dose Seasonal PF 01/23/2018, 12/07/2018   Influenza-Unspecified 12/23/2018   PFIZER(Purple Top)SARS-COV-2 Vaccination 05/14/2019, 06/08/2019   Pneumococcal Conjugate-13 07/16/2015   Pneumococcal Polysaccharide-23 12/23/2016   Tdap 09/02/2009, 04/05/2017    Conditions to be addressed/monitored: CAD, HTN, HLD, COPD, Anxiety, Depression and gout, GERD ; hypothyroidism  Care Plan : General Pharmacy (Adult)  Updates made by Cherre Robins, RPH-CPP since 03/03/2021 12:00 AM     Problem: Medication management and Monitoring and Coordination of Care for Chronic Conditions   Priority: High  Onset Date: 09/02/2020     Long-Range Goal: Medication management   Start Date: 09/02/2020  Recent Progress: On track  Priority: High  Note:     Current Barriers:  Unable to achieve control of hyperlipidemia  Does not adhere to prescribed medication regimen Does not maintain contact with provider office  Pharmacist Clinical Goal(s):  Over the next 90 days, patient will achieve adherence to monitoring guidelines and medication adherence to achieve therapeutic efficacy achieve control of hyperlipidemia as evidenced by LDL < 70 adhere to prescribed medication regimen as evidenced by filling prescriptions on times contact provider office for questions/concerns as evidenced notation of same in electronic health record through collaboration with PharmD and provider.   Interventions: 1:1 collaboration with Debbrah Alar, NP regarding development and update of comprehensive plan of care as evidenced by provider attestation and co-signature Inter-disciplinary care team collaboration (see  longitudinal plan of care) Comprehensive medication review performed; medication list updated in electronic medical record  Hypertension: Controlled though blood pressure was a little low at last office visit BP goal is:  <130/80  Patient is not checking BP at home - states she does not have BP cuff.  Patient has failed these meds in the past: None noted  Current Regimen: Metoprolol Succinate 25mg  0.5 tab daily Lisinopril 2.5mg  daily Interventions:  Reviewed signs and symptoms of low blood pressure  to monitor for.  Discussed blood pressure  goal Continue current medications for blood pressure    Hyperlipidemia / history of MI: Not controlled; LDL goal < 70  Patient has failed these meds in past: None noted   In September patient reported she just stopped taking atorvastatin because she was taking so many other medications Current therapy Atorvastatin 80mg  daily (increased 08/18/2020) Ezetimibe 10mg  daily (added 09/17/2020)  Metoprolol Succinate 25mg  1/2 tab daily Aspirin 81mg  daily  Nitroglycerin 0.4mg  - place 1 tablet under tongue as needed for chest pain, may repeat in 5 minutes if chest pain not relieved. Interventions:  Discussed LDL goal Discussed importance of taking atorvastatin daily to both lower cholesterol and prevent ASCVD / recurrent MI; Adherence has improved since she started adherence packaging. Reminded patient to reschedule appointment with cardiologist.   Chronic Obstructive Pulmonary Disease: Controlled Current treatment: Albuterol solution for nebulizer - use in nebulizer up to every 6 hours as needed for shortness of breath No exacerbations requiring treatment in the last 6 months  Interventions: None  Depression/Anxiety:  Controlled She still reports some days that she is sad when she thinks about her brother's recent passing.  Patient has failed these meds in past: citalopram (ineffective) Patient reports that she takes usually only 1 tablet at  bedtime but sometimes takes a second dose if needed.   Current treatment: Escitalopram 20mg  daily Diazepam 10mg  every 12 hours as needed  Interventions:  Discussed adherence and importance of taking escitalopram for depression / anxiety Encouraged patient to  use diazepam sparingly - only at night if able. Also mentioned she should secure her medication to prevent diversion.  Reminded patient of urine drug screen ordered for 03/06/21 Consider grief counseling Continue current therapy  Hypothyroidism:  TSH was WNL at last check on 08/18/2020 Patient has failed these meds in past: None noted  Patient is currently controlled on the following medications:  levothyroxine 20mcg daily  Intervention:  Continue current therapy   Gout:   Goal Uric Acid < 6 mg/dL   Last uric acid was not at goal (UA = 8.4 on 03/14/2020) but patient denies symptoms of gout at this time.  Current medications:  Allopurinol 100mg  - take 2 tablets = 200mg  daily Colchicine 0.6mg  up to bid as needed for acute gout Medications that may increase uric acid levels: Aspirin Last gout flare: Feb 2022 Patient has failed these meds in past: None noted   Interventions:  Reviewed preventative versus treatment medications for gout.  Continue current therapy Consider checking Uric acid with next labs.    Medication management:   She started medication adherence packaging 01/08/2021 and really likes it. Has helped her with remembering to take her medication on time.  Reviewed list form CMA with [facility] from 03/02/21. Isosorbide, aspirin and famotidine were missing from list. Spoke with pharmacy tech with [facility] and review medications and directions in packages. Isosorbide and Aspirin are to be included in packaging but [facility] did not have famotidine on med packaging list. Updated prescription for famotidine sent to pharmacy.  Current pharmacy:  [facility] Intervention:  Comprehensive medication review completed.   Coordinated packaging and refill for famotidine with pharmacy Reviewed with patient which medications are to be used as needed (these will not be included in packaging).     Patient Goals/Self-Care Activities Over the next 90 days, patient will:  take medications as prescribed, focus on medication adherence by filing prescriptions at one pharmacy, and call if any issues with medications   Follow Up Plan:  will check back in with patient in 1 month          Medication Assistance: None required.  Patient affirms current coverage meets needs.  Patient's preferred pharmacy is:  [place], [place] [address] Phone: [phone] Fax: [phone]  [facility] - [place], [place] - [address] [place] [address] Phone: [phone] Fax: [phone]  Follow Up:  Patient agrees to Care Plan and Follow-up.  Plan: follow up in 1 month  Cherre Robins, PharmD Clinical Pharmacist [place] [phone]

## 2021-03-04 DIAGNOSIS — I2510 Atherosclerotic heart disease of native coronary artery without angina pectoris: Secondary | ICD-10-CM

## 2021-03-04 DIAGNOSIS — E039 Hypothyroidism, unspecified: Secondary | ICD-10-CM

## 2021-03-04 DIAGNOSIS — I10 Essential (primary) hypertension: Secondary | ICD-10-CM | POA: Diagnosis not present

## 2021-03-04 DIAGNOSIS — F418 Other specified anxiety disorders: Secondary | ICD-10-CM | POA: Diagnosis not present

## 2021-03-04 DIAGNOSIS — E785 Hyperlipidemia, unspecified: Secondary | ICD-10-CM

## 2021-03-04 DIAGNOSIS — Z87891 Personal history of nicotine dependence: Secondary | ICD-10-CM

## 2021-03-05 ENCOUNTER — Other Ambulatory Visit: Payer: Medicare HMO

## 2021-03-05 ENCOUNTER — Other Ambulatory Visit (INDEPENDENT_AMBULATORY_CARE_PROVIDER_SITE_OTHER): Payer: Medicare HMO

## 2021-03-05 ENCOUNTER — Other Ambulatory Visit: Payer: Self-pay | Admitting: Family Medicine

## 2021-03-05 DIAGNOSIS — R350 Frequency of micturition: Secondary | ICD-10-CM

## 2021-03-05 DIAGNOSIS — R7303 Prediabetes: Secondary | ICD-10-CM | POA: Diagnosis not present

## 2021-03-05 DIAGNOSIS — F419 Anxiety disorder, unspecified: Secondary | ICD-10-CM | POA: Diagnosis not present

## 2021-03-05 DIAGNOSIS — Z79899 Other long term (current) drug therapy: Secondary | ICD-10-CM | POA: Diagnosis not present

## 2021-03-05 LAB — POC URINALSYSI DIPSTICK (AUTOMATED)
Bilirubin, UA: NEGATIVE
Blood, UA: NEGATIVE
Glucose, UA: NEGATIVE
Ketones, UA: NEGATIVE
Leukocytes, UA: NEGATIVE
Nitrite, UA: NEGATIVE
Protein, UA: NEGATIVE
Spec Grav, UA: <=1.005 — AB (ref 1.010–1.025)
Urobilinogen, UA: 0.2 E.U./dL
pH, UA: 6 (ref 5.0–8.0)

## 2021-03-05 NOTE — Progress Notes (Signed)
See earlier lab visit from today.

## 2021-03-05 NOTE — Addendum Note (Signed)
Addended by: Mervin Kung A on: 03/05/2021 04:02 PM   Modules accepted: Orders

## 2021-03-05 NOTE — Progress Notes (Signed)
Pt here for UDS and MALB.  Pt unable to urinate. Waited in office for 15 minutes and agreed to do cheek swab in place of UDS.  After completion of cheek swab pt thought she might be able to urinate. Collected scant amount but sample was turbid and green in color. Spoke with Dr Patsy Lager in PCP's absence. She recommends a better sample collection. Contacted pt, she has to arrange transportation and thinks she can come by tomorrow to provide another specimen. Pt denies dysuria but does note more frequent urination the last 2 to 3 days.   Pt's grandson returned another urine sample in sterile urine cup with pt's name and dob on cup. Urine is clear. Dip is negative. Sent for MALB as previously ordered.

## 2021-03-06 ENCOUNTER — Other Ambulatory Visit: Payer: Medicare HMO

## 2021-03-06 ENCOUNTER — Other Ambulatory Visit: Payer: Self-pay | Admitting: *Deleted

## 2021-03-06 DIAGNOSIS — I10 Essential (primary) hypertension: Secondary | ICD-10-CM

## 2021-03-06 LAB — URINE CULTURE
MICRO NUMBER:: 12701905
SPECIMEN QUALITY:: ADEQUATE

## 2021-03-06 LAB — MICROALBUMIN / CREATININE URINE RATIO
Creatinine,U: 20.1 mg/dL
Microalb Creat Ratio: 3.5 mg/g (ref 0.0–30.0)
Microalb, Ur: <0.7 mg/dL (ref 0.0–1.9)

## 2021-03-06 NOTE — Patient Instructions (Signed)
Visit Information  Thank you for taking time to visit with me today. Please don't hesitate to contact me if I can be of assistance to you before our next scheduled telephone appointment.  Following are the goals we discussed today:  Patient Goals/Self-Care Activities: Take all medications as prescribed Attend all scheduled provider appointments Call pharmacy for medication refills 3-7 days in advance of running out of medications identify and remove indoor air pollutants limit outdoor activity during cold weather eliminate symptom triggers at home practice relaxation or meditation daily Call to make an eye examination appointment    The patient verbalized understanding of instructions, educational materials, and care plan provided today and agreed to receive a mailed copy of patient instructions, educational materials, and care plan.   Telephone follow up appointment with care management team member scheduled TMA:[email or web address]  Blanchie Serve RN, BSN Nurse [facility] [phone] [email or web address]

## 2021-03-06 NOTE — Patient Outreach (Signed)
[facility]) Care Management  03/06/2021  Veronica Wolf 06-27-1940 [id number]  [facility]) Care Management RN Health Coach Note   03/06/2021 Name:  Veronica Wolf MRN:  [id number] DOB:  [birth year]  Summary: Patient reports that her COPD continues to be well controlled and that she has not had to use her rescue medication recently. Patient needs transportation assistance and nurse will send a referral to community care coordinator to assist. Patient states she continues to grieve over the recent loss of her brother. She declines grief counseling explaining that she is speaking to her daughter. Patient reports she is taking her prescribed depression and anti-anxiety medication.  Recommendations/Changes made from today's visit: Referral to community care coordinator to assist with transportation Encouraged patient to schedule an eye examination appointment Encouraged patient to contact [facility] to discuss social activities [phone]  Subjective: Veronica Wolf is an 80 y.o. year old female who is a primary patient of [name], NP. The care management team was consulted for assistance with care management and/or care coordination needs.    RN Health Coach completed Telephone Visit today.   Objective:  Medications Reviewed Today     Reviewed by Wanda Plump, RN (Registered Nurse) on 03/06/21 at 1149  Med List Status: <None>   Medication Order Taking? Sig Documenting Provider Last Dose Status Informant  albuterol (PROVENTIL) (2.5 MG/3ML) 0.083% nebulizer solution [id number] Yes USE 1 VIAL IN NEBULIZER EVERY 6 HOURS AS NEEDED FOR WHEEZING AND FOR SHORTNESS OF [place], Efraim Kaufmann, NP Taking Active   allopurinol (ZYLOPRIM) 100 MG tablet [id number] Yes TAKE TWO TABLETS BY MOUTH ONCE DAILY [name], NP Taking Active   aspirin 81 MG EC tablet [id number] Yes Take 1 tablet (81 mg total) by mouth daily. Swallow whole.  Tobb, Lavona Mound, DO Taking Active   atorvastatin (LIPITOR) 80 MG tablet [id number] Yes Take 1 tablet (80 mg total) by mouth daily. Thomasene Ripple, DO Taking Active   colchicine 0.6 MG tablet [id number] Yes Take 0.6 mg by mouth daily as needed (gout). [provider] Taking Active   diazepam (VALIUM) 10 MG tablet [id number] Yes TAKE ONE TABLET BY MOUTH EVERY TWELVE HOURS AS NEEDED FOR ANXIETY [name], NP Taking Active   dicyclomine (BENTYL) 10 MG capsule [id number] Yes Take 1 capsule (10 mg total) by mouth in the morning, at noon, in the evening, and at bedtime. [name], NP Taking Active   escitalopram (LEXAPRO) 20 MG tablet [id number] Yes TAKE ONE TABLET BY MOUTH ONCE DAILY [name], NP Taking Active   ezetimibe (ZETIA) 10 MG tablet [id number] Yes Take 1 tablet (10 mg total) by mouth daily. Thomasene Ripple, DO Taking Active   famotidine (PEPCID) 20 MG tablet [id number] Yes Take 1 tablet (20 mg total) by mouth 2 (two) times daily. In the morning and at bedtime Wanda Plump, MD Taking Active   isosorbide mononitrate (IMDUR) 30 MG 24 hr tablet [id number] Yes Take 0.5 tablets (15 mg total) by mouth daily. Thomasene Ripple, DO Taking Active   levothyroxine (SYNTHROID) 25 MCG tablet [id number] Yes TAKE ONE TABLET BY MOUTH EVERY MORNING [name], NP Taking Active   lisinopril (ZESTRIL) 2.5 MG tablet [id number] Yes Take 1 tablet (2.5 mg total) by mouth daily. Tobb, Kardie, DO Taking Active   meclizine (ANTIVERT) 25 MG tablet [id number] Yes TAKE ONE TABLET BY MOUTH TWICE DAILY AS NEEDED FOR dizziness [name], NP Taking Active   metoprolol succinate (TOPROL-XL) 25 MG 24  hr tablet [id number] Yes Take 0.5 tablets (12.5 mg total) by mouth daily. Tobb, Kardie, DO Taking Active   nitroGLYCERIN (NITROSTAT) 0.4 MG SL tablet [id number] Yes Place 1 tablet (0.4 mg total) under the tongue every 5 (five) minutes as needed for chest pain. Thomasene Ripple, DO Taking Active   nystatin  powder [id number] Yes APPLY  POWDER TOPICALLY TO AFFECTED AREA 2 TIMES DAILY [name], NP Taking Active   pantoprazole (PROTONIX) 40 MG tablet [id number] Yes TAKE ONE TABLET BY MOUTH TWICE DAILY [name], NP Taking Active   potassium chloride SA (KLOR-CON) 20 MEQ tablet [id number] Yes Take 1 tablet (20 mEq total) by mouth daily. [name], NP Taking Active   sucralfate (CARAFATE) 1 g tablet [id number] No TAKE ONE TABLET BY MOUTH FOUR TIMES DAILY. MAY cut pill in half AND DISSOLVE in water prior TO drinking  Patient not taking: Reported on 03/06/2021   Shellia Cleverly, DO Not Taking Active            Med Note Hoyt Koch, Cierria Height A   Fri Mar 06, 2021 11:43 AM) Reports not taking             SDOH:  (Social Determinants of Health) assessments and interventions performed:  SDOH Interventions    Flowsheet Row Most Recent Value  SDOH Interventions   Transportation Interventions --  [referral sent to community care guide for transportation assistance]  Depression Interventions/Treatment  Medication       Care Plan  Review of patient past medical history, allergies, medications, health status, including review of consultants reports, laboratory and other test data, was performed as part of comprehensive evaluation for care management services.   Care Plan : COPD (Adult)  Updates made by Wanda Plump, RN since 03/06/2021 12:00 AM     Problem: Disease Progression (COPD) Resolved 03/06/2021  Priority: High     Long-Range Goal: Disease Progression Minimized or Managed Completed 03/06/2021  Start Date: 01/04/2020  Expected End Date: 02/01/2021  Priority: High  Note:   Resolving due to duplicate goal  Patient will have no hospital admissions over the next 90 days.   Evidence-based guidance:  Identify current smoking/tobacco use; provide smoking cessation intervention.  Assess symptom control by the frequency and type of symptoms, reliever use and activity  limitation at every encounter.  Assess risk for exacerbation (flare up) by evaluating spirometry, pulse oximetry, reliever use, presentation of symptoms and activity limitation; anticipate treatment adjustment based on risks and resources.  Develop and/or review and reinforce use of COPD rescue (action) plan even when symptoms are controlled or infrequent.  Ask patient to bring inhaler to all visits; assess and reinforce correct technique; address barriers to proper inhaler use, such as older age, use of multiple devices and lack of understanding.   Identify symptom triggers, such as smoking, virus, weather change, emotional upset, exercise, obesity and environmental allergen; consider reduction of work-exposure versus elimination to avoid compromising employment.  Correlate presentation to comorbidity, such as diabetes, heart failure, obstructive sleep apnea, depression and anxiety, which may worsen symptoms.  Prepare for individualized pharmacologic therapy that may include LABA (long-acting beta-2 agonist), LAMA (long-acting muscarinic antagonist), SABA (short-acting beta-2 agonist) oral or inhaled corticosteroid.  Promote participation in pulmonary rehabilitation for breathing exercises, skills training, improved exercise capacity, mood and quality of life; address barriers to participation.  Promote physical activity or exercise to improve or maintain exercise capacity, based on tolerance that may include walking, water exercise, cycling or limb muscle strength training.  Promote use of energy conservation and activity pacing techniques.  Promote use of breathing and coughing techniques, such as inspiratory muscle training, pursed-lip breathing, diaphragmatic breathing, pranayama yoga breathing or huff cough.  Screen for malnutrition risk factors, such as unintentional weight loss and poor oral intake; refer to dietitian if identified.  Consider recommendation for oral drink supplement or  multivitamin and mineral supplements if suspect inadequate oral intake or micronutrient deficiencies.   Screen for obstructive sleep apnea; prepare patient for polysomnography based on risk and presentation.  Prepare patient for use of long-term oxygen and noninvasive ventilation to relieve hypercapnia, hypoxemia, obstructive sleep apnea and reduce work of breathing.  Prepare patient with worsening disease for surgical interventions that may include bronchoscopy, lung volume reduction surgery, bullectomy or lung transplantation.   Notes:     Task: Alleviate Barriers to COPD Management Completed 03/06/2021  Due Date: 02/01/2021  Note:   Care Management Activities:    - barriers to treatment managed - communicable disease prevention promoted - medication-adherence assessment completed - rescue (action) plan reviewed - screen for functional limitations completed and reviewed - self-awareness of symptom triggers encouraged    Notes:     Problem: Symptom Exacerbation (COPD) Resolved 03/06/2021  Priority: High  Note:          Long-Range Goal: Symptom Exacerbation Prevented or Minimized Completed 03/06/2021  Start Date: 01/04/2020  Expected End Date: 02/01/2021  Priority: High  Note:   Resolving due to duplicate goal  Patient will obtain flu and covid vaccine.  Evidence-based guidance:  Monitor for signs of respiratory infection, including changes in sputum color, volume and thickness, as well as fever.  Encourage infection prevention strategies that may include prophylactic antibiotic therapy for patients with history of frequent exacerbations or antibiotic administration during exacerbation based on presentation, risk and benefit.  Encourage receipt of influenza and pneumococcal vaccine.  Prepare patient for use of home long-term oxygen therapy in presence of sever resting hypoxemia.  Prepare patients for laboratory studies or diagnostic exams, such as spirometry, pulse oximetry and  arterial blood gas based on current symptoms, risk factors and presentation.  Assess barriers and manage adherence, including inhaler technique and persistent trigger exposure; encourage adherence, even when symptoms are controlled or infrequent.  Assess and monitor for signs/symptoms of psychosocial concerns, such as shortness of breath-anxiety cycle or depression that may impact stability of symptoms.  Identify economic resources, sociocultural beliefs, social factors and health literacy that may interfere with adherence.  Promote lifestyle changes when needed, including regular physical activity based on tolerance, weight loss, healthy eating and stress management.  Consider referral to nurse or community health worker or home-visiting program for intensive support and education (disease-management program).  Increase frequency of follow-up following exacerbation or hospitalization; consider transition of care interventions, such as hospital visit, home visit, telephone follow-up, review of discharge summary and resource referrals.   Notes:     Task: Identify and Minimize Risk of COPD Exacerbation Completed 03/06/2021  Due Date: 02/01/2021  Note:   Care Management Activities:    - barriers to lifestyle changes reviewed and addressed - healthy lifestyle promoted - rescue (action) plan reviewed - signs/symptoms of worsening disease assessed - symptom triggers identified - treatment plan reviewed    Notes:     Care Plan : RN Care Manager Plan of Care  Updates made by Wanda Plump, RN since 03/06/2021 12:00 AM     Problem: Knowledge Deficit Related to COPD   Priority: High  Long-Range Goal: Development of Plan of Care for the Management of COPD   Start Date: 03/06/2021  Expected End Date: 04/03/2022  Note:   Current Barriers:  Knowledge Deficits related to plan of care for management of COPD  Lacks caregiver support  RNCM Clinical Goal(s):  Patient will take all medications  exactly as prescribed and will call provider for medication related questions as evidenced by continuation of receiving care from [facility] pharmacist; receiving her prescribed medications via delivery from [facility] prepackaged; patient's verbalization that she is taking her medications as prescribed demonstrate Ongoing health management independence as evidenced by no ED visits or hospitalizations related to COPD within the next 90 days work with [employer] care guide to address needs related to  Transportation as evidenced by patient and/or community resource care guide support through collaboration with Medical illustrator, provider, and care team.   Interventions: Inter-disciplinary care team collaboration (see longitudinal plan of care) Evaluation of current treatment plan related to  self management and patient's adherence to plan as established by provider Referral to community care coordinator to assist with transportation Encouraged patient to schedule an eye examination appointment   COPD Interventions:  (Status:  Goal on track:  Yes.) Long Term Goal Provided patient with basic written and verbal COPD education on self care/management/and exacerbation prevention Provided written and verbal instructions on pursed lip breathing and utilized returned demonstration as teach back Advised patient to self assesses COPD action plan zone and make appointment with provider if in the yellow zone for 48 hours without improvement Provided education about and advised patient to utilize infection prevention strategies to reduce risk of respiratory infection  Patient Goals/Self-Care Activities: Take all medications as prescribed Attend all scheduled provider appointments Call pharmacy for medication refills 3-7 days in advance of running out of medications identify and remove indoor air pollutants limit outdoor activity during cold weather eliminate symptom triggers at home practice relaxation  or meditation daily Call to make an eye examination appointment   Follow Up Plan:  Telephone follow up appointment with care management team member scheduled for:  January       Plan: Face to Face appointment with care management team member scheduled for: January . Nurse will send PCP today's assessment note.   Blanchie Serve RN, Mining engineer [facility] [phone] [email or web address]

## 2021-03-09 ENCOUNTER — Telehealth: Payer: Self-pay

## 2021-03-09 ENCOUNTER — Telehealth: Payer: Self-pay | Admitting: *Deleted

## 2021-03-09 DIAGNOSIS — Z008 Encounter for other general examination: Secondary | ICD-10-CM | POA: Diagnosis not present

## 2021-03-09 LAB — DRUG TOX MONITOR 1 W/CONF, ORAL FLD
Alprazolam: NEGATIVE ng/mL (ref <0.50)
Amphetamines: NEGATIVE ng/mL (ref <10)
Barbiturates: NEGATIVE ng/mL (ref <10)
Benzodiazepines: POSITIVE ng/mL — AB (ref <0.50)
Buprenorphine: NEGATIVE ng/mL (ref <0.10)
Chlordiazepoxide: NEGATIVE ng/mL (ref <0.50)
Clonazepam: NEGATIVE ng/mL (ref <0.50)
Cocaine: NEGATIVE ng/mL (ref <5.0)
Diazepam: 1.54 ng/mL — ABNORMAL HIGH (ref <0.50)
Fentanyl: NEGATIVE ng/mL (ref <0.10)
Flunitrazepam: NEGATIVE ng/mL (ref <0.50)
Flurazepam: NEGATIVE ng/mL (ref <0.50)
Heroin Metabolite: NEGATIVE ng/mL (ref <1.0)
Lorazepam: NEGATIVE ng/mL (ref <0.50)
MARIJUANA: NEGATIVE ng/mL (ref <2.5)
MDMA: NEGATIVE ng/mL (ref <10)
Meprobamate: NEGATIVE ng/mL (ref <2.5)
Methadone: NEGATIVE ng/mL (ref <5.0)
Midazolam: NEGATIVE ng/mL (ref <0.50)
Nicotine Metabolite: NEGATIVE ng/mL (ref <5.0)
Nordiazepam: 4.3 ng/mL — ABNORMAL HIGH (ref <0.50)
Opiates: NEGATIVE ng/mL (ref <2.5)
Oxazepam: NEGATIVE ng/mL (ref <0.50)
Phencyclidine: NEGATIVE ng/mL (ref <10)
Tapentadol: NEGATIVE ng/mL (ref <5.0)
Temazepam: NEGATIVE ng/mL (ref <0.50)
Tramadol: NEGATIVE ng/mL (ref <5.0)
Triazolam: NEGATIVE ng/mL (ref <0.50)
Zolpidem: NEGATIVE ng/mL (ref <5.0)

## 2021-03-09 NOTE — Telephone Encounter (Signed)
   Telephone encounter was:  Successful.  03/09/2021 Name: Veronica Wolf MRN: [id number] DOB: 1940/12/30  Veronica Wolf is a 80 y.o. year old female who is a primary care patient of [name], NP . The community resource team was consulted for assistance with Transportation Needs Patient outreached and going to mail her information on [employer] transportation and also on [place] transport[place]  Care guide performed the following interventions: Patient provided with information about care guide support team and interviewed to confirm resource needs Follow up call placed to the patient to discuss status of referral.  Follow Up Plan:  No further follow up planned at this time. The patient has been provided with needed resources.  Alois Cliche -[facility] Guide , Embedded Care Coordination [facility], Care Management  [phone] [address] [place] , [place] [address] Email : [email or web address]

## 2021-03-09 NOTE — Telephone Encounter (Signed)
   Telephone encounter was:   Successful.  03/09/2021 Name: Veronica Wolf MRN: [id number] DOB: 02/06/1941  Veronica Wolf is a 80 y.o. year old female who is a primary care patient of [name], NP . The community resource team was consulted for assistance with Transportation Needs   Care guide performed the following interventions:  Pt advised she is only needing transportation to and from doctors appt. She stated at this time, she does not have any appointments scheduled. She is just inquring about transportation for future needs. Pt also advised she has [facility] again.  Pt also advised she is ambulatory and no ride support is needed.  Follow Up Plan:  Care guide will follow up with patient by phone over the next few days after speaking with [employer] and [employer] to ensure which plan is active and has a transportation benefit.  [facility] Guide, Embedded Care Coordination [facility]  [place], [place] [address]  Main Phone: [phone]  E-mail: [email or web address]  Website: [email or web address]

## 2021-03-10 ENCOUNTER — Telehealth: Payer: Self-pay | Admitting: Family

## 2021-03-10 NOTE — Telephone Encounter (Signed)
Pt called to notify she is changing pharmacy.   [facility] [address] [place], [place] [address] [phone]

## 2021-03-13 NOTE — Telephone Encounter (Signed)
Per [facility], they will transfer all meds to [facility] pharmacy phone and fax number provided as Phone [phone] and fax [phone].

## 2021-03-17 ENCOUNTER — Ambulatory Visit: Payer: Medicare HMO | Admitting: *Deleted

## 2021-03-18 ENCOUNTER — Telehealth: Payer: Self-pay | Admitting: Family

## 2021-03-18 NOTE — Telephone Encounter (Signed)
Medication:  Pt needs a :  Glucose meter with supplies and test strips  famotidine (PEPCID) 20 MG tablet   nystatin powder   meclizine (ANTIVERT) 25 MG tablet   potassium chloride SA (KLOR-CON) 20 MEQ tablet   pantoprazole (PROTONIX) 40 MG tablet   diazepam (VALIUM) 10 MG tablet    dicyclomine (BENTYL) 10 MG capsule   levothyroxine (SYNTHROID) 25 MCG tablet    escitalopram (LEXAPRO) 20 MG tablet   albuterol (PROVENTIL) (2.5 MG/3ML) 0.083% nebulizer solution   potassium chloride SA (KLOR-CON) 20 MEQ tablet   Has the patient contacted their pharmacy? Yes.   (If no, request that the patient contact the pharmacy for the refill.) (If yes, when and what did the pharmacy advise?)  Preferred Pharmacy (with phone number or street name):  [facility] [address] [place], [place] [address] Fax:[phone]  Agent: Please be advised that RX refills may take up to 3 business days. We ask that you follow-up with your pharmacy.

## 2021-03-19 ENCOUNTER — Other Ambulatory Visit: Payer: Self-pay | Admitting: Family

## 2021-03-19 NOTE — Telephone Encounter (Signed)
Requesting: diazepam 10mg  Contract: 03/14/2020 UDS: 03/05/2021 Last Visit: 02/16/2021 Next Visit: None Last Refill:03/03/21 #10 and 0RF  Please Advise

## 2021-03-20 ENCOUNTER — Other Ambulatory Visit: Payer: Self-pay

## 2021-03-20 MED ORDER — LISINOPRIL 2.5 MG PO TABS: 2.5000 mg | ORAL_TABLET | Freq: Every day | ORAL | 3 refills | Status: DC

## 2021-03-20 MED ORDER — POTASSIUM CHLORIDE CRYS ER 20 MEQ PO TBCR: 20.0000 meq | EXTENDED_RELEASE_TABLET | Freq: Every day | ORAL | 1 refills | Status: DC

## 2021-03-20 MED ORDER — DICYCLOMINE HCL 10 MG PO CAPS
10.0000 mg | ORAL_CAPSULE | Freq: Four times a day (QID) | ORAL | 1 refills | Status: DC
Start: 2021-03-20 — End: 2022-11-29

## 2021-03-20 MED ORDER — ESCITALOPRAM OXALATE 20 MG PO TABS: 20.0000 mg | ORAL_TABLET | Freq: Every day | ORAL | 1 refills | Status: DC

## 2021-03-20 MED ORDER — SUCRALFATE 1 G PO TABS: ORAL_TABLET | ORAL | 0 refills | Status: DC

## 2021-03-20 MED ORDER — ISOSORBIDE MONONITRATE ER 30 MG PO TB24: 15.0000 mg | ORAL_TABLET | Freq: Every day | ORAL | 3 refills | Status: DC

## 2021-03-20 MED ORDER — PANTOPRAZOLE SODIUM 40 MG PO TBEC: 40.0000 mg | DELAYED_RELEASE_TABLET | Freq: Two times a day (BID) | ORAL | 0 refills | Status: DC

## 2021-03-20 MED ORDER — EZETIMIBE 10 MG PO TABS: 10.0000 mg | ORAL_TABLET | Freq: Every day | ORAL | 3 refills | Status: DC

## 2021-03-20 MED ORDER — LEVOTHYROXINE SODIUM 25 MCG PO TABS: ORAL_TABLET | ORAL | 1 refills | Status: DC

## 2021-03-20 MED ORDER — METOPROLOL SUCCINATE ER 25 MG PO TB24
12.5000 mg | ORAL_TABLET | Freq: Every day | ORAL | 3 refills | Status: DC
Start: 2021-03-20 — End: 2021-11-03

## 2021-03-20 MED ORDER — ATORVASTATIN CALCIUM 80 MG PO TABS: 80.0000 mg | ORAL_TABLET | Freq: Every day | ORAL | 3 refills | Status: DC

## 2021-03-20 MED ORDER — ALBUTEROL SULFATE (2.5 MG/3ML) 0.083% IN NEBU: INHALATION_SOLUTION | RESPIRATORY_TRACT | 0 refills | Status: DC

## 2021-03-20 MED ORDER — ALLOPURINOL 100 MG PO TABS
200.0000 mg | ORAL_TABLET | Freq: Every day | ORAL | 1 refills | Status: DC
Start: 2021-03-20 — End: 2021-08-25

## 2021-03-20 NOTE — Telephone Encounter (Signed)
Prescriptions sent to patient's new mail order pharmacy

## 2021-03-25 ENCOUNTER — Ambulatory Visit (INDEPENDENT_AMBULATORY_CARE_PROVIDER_SITE_OTHER): Payer: Medicare HMO | Admitting: Pharmacist

## 2021-03-25 DIAGNOSIS — I10 Essential (primary) hypertension: Secondary | ICD-10-CM

## 2021-03-25 DIAGNOSIS — Z79899 Other long term (current) drug therapy: Secondary | ICD-10-CM

## 2021-03-25 DIAGNOSIS — E785 Hyperlipidemia, unspecified: Secondary | ICD-10-CM

## 2021-03-25 DIAGNOSIS — F419 Anxiety disorder, unspecified: Secondary | ICD-10-CM

## 2021-03-25 DIAGNOSIS — F418 Other specified anxiety disorders: Secondary | ICD-10-CM

## 2021-03-25 DIAGNOSIS — R1310 Dysphagia, unspecified: Secondary | ICD-10-CM

## 2021-03-25 NOTE — Patient Instructions (Addendum)
Veronica Wolf It was a pleasure speaking with you today.  I have attached a summary of our visit today and information about your health goals.   If you have any questions or concerns, please feel free to contact me either at the phone number below or with a MyChart message.   Keep up the good work!  Veronica Wolf, PharmD Clinical Pharmacist [facility] [phone] (direct line)  [phone] (main office number)  CARE PLAN ENTRY  Hypertension Screening BP Readings from Last 3 Encounters:  02/16/21 (!) 102/56  01/09/21 (!) 143/93  12/03/20 (!) 110/55   Pharmacist Clinical Goal(s): Over the next 90 days, patient will work with PharmD and providers to achieve BP goal <130/80 Current regimen:  Metoprolol Succinate 25mg  0.5 tab daily Lisinopril 2.5mg  daily Interventions: Discussed blood pressure goal Discussed importance of taking metoprolol and lisinopril for heart health Patient self care activities - Over the next 90 days, patient will: Maintain blood pressure less than 130/80 Consider purchasing blood pressure cuff and check blood pressure 2 to 3 times per week and record Continue current medications    Hyperlipidemia / Heart health Lab Results  Component Value Date/Time   LDLCALC 125 (H) 08/18/2020 12:03 PM   LDLCALC 108 (H) 03/14/2020 02:39 PM   Pharmacist Clinical Goal(s): Over the next 90 days, patient will work with PharmD and providers to achieve LDL goal < 70 Current regimen:  Atorvastatin 80mg  daily  Ezetimibe 10mg  daily Aspirin 81mg  daily  Metoprolol succinate ER 25mg  - take 0.5 tablet daily Nitroglycerin 0.4mg  - place 1 tablet under the tongue as needed for chest pain, may repeat in 5 minutes if chest pain not relieved.  Interventions: Discussed LDL goal and importance of medication adherence Repeat LDL at next office visit since ezetimibe started Discussed adherence Patient self care activities - Over the next 90 days,  patient will: Continue medications currently prescribed to lower cholesterol and to prevent heart attack Rescheduled appointment with cardiologist  Anxiety / Depression:  Pharmacist Clinical Goal(s): Over the next 45 days, patient will work with PharmD and providers to maintain control of anxiety and depression symptoms Current regimen:  Escitalopram 20mg  daily  Diazepam 10mg  take 1 tablet every 12 hours if needed for anxiety Interventions: Discussed using diazepam only if needed. Limit use during daytime if able.  Patient self care activities - Over the next 90 days, patient will: Maintain current medication regimen for anxiety and depression  Remember to take diazepam only as needed.   Gout:  Pharmacist Clinical Goal(s): Over the next 90 days, patient will work with PharmD and providers to prevent acute gout episodes / lower uric acid Current regimen:  Allopurinol 100mg  - take 2 tablets = 200mg  daily (prevention) Colchicine 0.6mg  take up to twice a day as needed for gout attack Interventions: Discussed difference between maintenance / presentation medication and treatment Patient self care activities - Over the next 90 days, patient will: Maintain current medication regimen for gout  Pre-Diabetes Lab Results  Component Value Date/Time   HGBA1C 6.5 01/09/2021 08:10 AM   HGBA1C 6.2 (H) 09/17/2020 10:38 AM   Pharmacist Clinical Goal(s): Over the next 90 days, patient will work with PharmD and providers to maintain A1c goal <6.5% Current regimen:  Diet and exercise management   Interventions: Discussed the importance of limiting carbohydrates (30-45 grams per meal) Patient self care activities - Over the next 90 days, patient will: Maintain a1c <6.5%  Hypothyroidism Pharmacist Clinical Goal(s) Over the next  90 days, patient will work with PharmD and providers to maintain TSH within normal limits and reduce risk of symptoms associated with hypothyroidism Current regimen:   Euthyrox / levothyroxine daily Interventions: Discussed importance of medication adherence Reminded to separate Euthyrox / levothyroxine dose by 30 minutes from breakfast and morning medications Patient self care activities - Over the next 90 days, patient will: Maintain hypothyroidism medication regimen  Medication management Pharmacist Clinical Goal(s): Over the next 90 days, patient will work with PharmD and providers to achieve optimal medication adherence Current pharmacy: [facility] Intervention:  Comprehensive medication review completed.  Coordinated packaging and refill for famotidine with pharmacy Reviewed with patient which medications are to be used as needed (these will not be included in packaging).   Discussed plan to get 1 more refill from [facility] with patient. Will transition to [facility] pharmacy is Veronica Wolf is her insurance in 2023.  Interventions Comprehensive medication review performed. Utilize [facility] pharmacy for medication synchronization, packaging and delivery at least through 2022. Will change to [facility] if Medicare plan is [employer] in 2023.  Reminded to secure all medications in your home from potential diversion or accidental ingestion.  Patient self care activities - Over the next 90 days, patient will: Focus on medication adherence by filling and taking medications appropriately  Take medications as prescribed Report any questions or concerns to PharmD and/or provider(s) Contact Veronica Wolf, PharmD if any issues with medications   The patient verbalized understanding of instructions, educational materials, and care plan provided today and declined offer to receive copy of patient instructions, educational materials, and care plan.

## 2021-03-27 ENCOUNTER — Other Ambulatory Visit: Payer: Self-pay | Admitting: Family

## 2021-03-27 ENCOUNTER — Telehealth: Payer: Self-pay | Admitting: Family

## 2021-03-27 NOTE — Telephone Encounter (Signed)
Dr. Servando Salina, I received a message from [facility] pharmacy requesting [place] to change her imdur ER 30mg  1/2 tab because it can't be cut in half.  I don't see a 15 mg tab option.  Looks like you started this for the patient back in June.  Would you mind sending in refill/dose of your choice  please to [facility] please?

## 2021-03-31 NOTE — Chronic Care Management (AMB) (Signed)
Chronic Care Management Pharmacy Note  03/31/2021 Name:  Veronica Wolf MRN:  [id number] DOB:  05/15/1940  Summary: Coordinated packaging with [facility]. Patient will need 1 more refill thru [facility] on March 31 2021 to get her thru until benefits thru [place] start (patient is not sure that she changed to [facility] for 2023. She will check with there son in law who was helping her pick a plan for 2023) . If she has changed to [employer] for 2023, will assist with transition to [facility] in January 2023.    Subjective: Veronica Wolf is an 80 y.o. year old female who is a primary patient of Debbrah Alar, NP.  The CCM team was consulted for assistance with disease management and care coordination needs.    Engaged with patient by telephone for follow up visit in response to provider referral for pharmacy case management and/or care coordination services.   Consent to Services:  The patient was given information about Chronic Care Management services, agreed to services, and gave verbal consent prior to initiation of services.  Please see initial visit note for detailed documentation.   Patient Care Team: Debbrah Alar, NP as PCP - General (Internal Medicine) Revankar, Reita Cliche, MD as PCP - Cardiology (Cardiology) Michiel Cowboy, RN as [place], RPH-CPP (Pharmacist)  Recent office visits: 02/16/2021 - Phone Call  to PCP office regarding rash under her breast and stomach. Debbrah Alar sent in prescription for nystatin powder.  02/16/2021 - Internal Med Inda Castle) F/U anxiety and stomach pain. Referred to GI for abdominal pain. UDS ordered. BP noted to be low - no medication changes. Continue to minitor BP.  01/09/2021 - PCP Inda Castle, NP) Seen for epigatric / chest pain. Labs ordered - CBC, BMP and lipase. Also ordered CT abdomin. No med changes noted.  12/03/2020- PCP Inda Castle, NP) Seen for back pain.  Prescribed Medrol dose pack and ordered Xay. Also checked urinalysis and recommended she complete course of ciprofloxacin given while she was in [place].  09/16/2020 - PCP Phone Call. Ultrasound showed fatty liver. Recommended follow up with GI.  09/08/2020 - PCP Inda Castle) Seen for abdominal pain and diarrhea. Hemoccult negative. Reordered Abdominal CT, LFTs and lipase, CBC. No medication changes  Recent consult visits: 09/17/2020 - Cardio (Dr Harriet Masson) CAD / intermittant chest pain. Started low dose Imdue $RemoveBe'15mg'hoYQBTtKo$  daily and ezetimibe $RemoveBefor'10mg'pGXFlEVtoCAK$  daily. Ordered nucular stress test   Hospital visits: None in the last 6 months   Objective:  Lab Results  Component Value Date   CREATININE 1.40 (H) 01/09/2021   CREATININE 1.38 (H) 08/18/2020   CREATININE 1.23 (H) 06/11/2020    Lab Results  Component Value Date   HGBA1C 6.5 01/09/2021   Last diabetic Eye exam: No results found for: HMDIABEYEEXA  Last diabetic Foot exam: No results found for: HMDIABFOOTEX      Component Value Date/Time   CHOL 204 (H) 08/18/2020 1203   TRIG 182.0 (H) 08/18/2020 1203   HDL 42.30 08/18/2020 1203   CHOLHDL 5 08/18/2020 1203   VLDL 36.4 08/18/2020 1203   LDLCALC 125 (H) 08/18/2020 1203   LDLCALC 108 (H) 03/14/2020 1439    Hepatic Function Latest Ref Rng & Units 01/09/2021 09/08/2020 08/18/2020  Total Protein 6.0 - 8.3 g/dL 6.7 6.3 6.6  Albumin 3.5 - 5.2 g/dL 3.8 3.6 3.8  AST 0 - 37 U/L $Remo'14 10 12  'AWUeI$ ALT 0 - 35 U/L $Remo'6 5 7  'WgNJA$ Alk Phosphatase 39 -  117 U/L 82 64 80  Total Bilirubin 0.2 - 1.2 mg/dL 0.3 0.4 0.2  Bilirubin, Direct 0.0 - 0.3 mg/dL - 0.1 -    Lab Results  Component Value Date/Time   TSH 3.57 08/18/2020 12:03 PM   TSH 1.77 03/14/2020 02:39 PM   FREET4 0.85 01/24/2018 03:04 PM    CBC Latest Ref Rng & Units 09/08/2020 06/11/2020 01/23/2018  WBC 4.0 - 10.5 K/uL 7.3 9.1 8.3  Hemoglobin 12.0 - 15.0 g/dL 11.6(L) 11.5(L) 11.9(L)  Hematocrit 36.0 - 46.0 % 36.1 38.1 36.6  Platelets 150.0 - 400.0 K/uL 219.0 219  190.0    No results found for: VD25OH  Clinical ASCVD: Yes  The ASCVD Risk score (Arnett DK, et al., 2019) failed to calculate for the following reasons:   The 2019 ASCVD risk score is only valid for ages 20 to 53   The patient has a prior MI or stroke diagnosis    Other:  DEXA 03/05/2014             AP LUMBAR SPINE not used for calculation due to significant degenerative changes.             Left FOREARM (1/3 RADIUS) T Score: -0.4             Left FEMUR neck T-Score: -0.7     Social History   Tobacco Use  Smoking Status Former   Types: Cigarettes   Quit date: 04/24/2000   Years since quitting: 20.9  Smokeless Tobacco Never   BP Readings from Last 3 Encounters:  02/16/21 (!) 102/56  01/09/21 (!) 143/93  12/03/20 (!) 110/55   Pulse Readings from Last 3 Encounters:  02/16/21 82  01/09/21 88  12/03/20 87   Wt Readings from Last 3 Encounters:  02/16/21 182 lb (82.6 kg)  01/09/21 189 lb (85.7 kg)  12/03/20 188 lb (85.3 kg)    Assessment: Review of patient past medical history, allergies, medications, health status, including review of consultants reports, laboratory and other test data, was performed as part of comprehensive evaluation and provision of chronic care management services.   SDOH:  (Social Determinants of Health) assessments and interventions performed:  SDOH Interventions    Flowsheet Row Most Recent Value  SDOH Interventions   Physical Activity Interventions Patient Refused        CCM Care Plan  Allergies  Allergen Reactions   Ibuprofen Other (See Comments)    Pt has hx of peptic ulcer and diverticulitis.     Medications Reviewed Today     Reviewed by Cherre Robins, RPH-CPP (Pharmacist) on 03/25/21 at 48  Med List Status: <None>   Medication Order Taking? Sig Documenting Provider Last Dose Status Informant  albuterol (PROVENTIL) (2.5 MG/3ML) 0.083% nebulizer solution [id number] Yes USE 1 VIAL IN NEBULIZER EVERY 6 HOURS AS NEEDED FOR  WHEEZING AND FOR SHORTNESS OF [name], Lenna Sciara, NP Taking Active   allopurinol (ZYLOPRIM) 100 MG tablet [id number] Yes Take 2 tablets (200 mg total) by mouth daily. Debbrah Alar, NP Taking Active   aspirin 81 MG EC tablet [id number] Yes Take 1 tablet (81 mg total) by mouth daily. Swallow whole. Tobb, Godfrey Pick, DO Taking Active   atorvastatin (LIPITOR) 80 MG tablet [id number] Yes Take 1 tablet (80 mg total) by mouth daily. Debbrah Alar, NP Taking Active   colchicine 0.6 MG tablet [id number] Yes Take 0.6 mg by mouth daily as needed (gout). [provider] Taking Active   diazepam (VALIUM) 10 MG tablet [id number] Yes TAKE ONE  TABLET BY MOUTH every 12 hours AS NEEDED FOR anxiety Debbrah Alar, NP Taking Active   dicyclomine (BENTYL) 10 MG capsule [id number] Yes Take 1 capsule (10 mg total) by mouth in the morning, at noon, in the evening, and at bedtime. Debbrah Alar, NP Taking Active   escitalopram (LEXAPRO) 20 MG tablet [id number] Yes Take 1 tablet (20 mg total) by mouth daily. Debbrah Alar, NP Taking Active   ezetimibe (ZETIA) 10 MG tablet [id number] Yes Take 1 tablet (10 mg total) by mouth daily. Debbrah Alar, NP Taking Active   famotidine (PEPCID) 20 MG tablet [id number] Yes Take 1 tablet (20 mg total) by mouth 2 (two) times daily. In the morning and at bedtime Colon Branch, MD Taking Active   isosorbide mononitrate (IMDUR) 30 MG 24 hr tablet [id number] Yes Take 0.5 tablets (15 mg total) by mouth daily. Debbrah Alar, NP Taking Active   levothyroxine (SYNTHROID) 25 MCG tablet [id number] Yes TAKE ONE TABLET BY MOUTH EVERY MORNING Debbrah Alar, NP Taking Active   lisinopril (ZESTRIL) 2.5 MG tablet [id number] Yes Take 1 tablet (2.5 mg total) by mouth daily. Debbrah Alar, NP Taking Active   meclizine (ANTIVERT) 25 MG tablet [id number] Yes TAKE ONE TABLET BY MOUTH TWICE DAILY AS NEEDED FOR dizziness Debbrah Alar, NP Taking Active    metoprolol succinate (TOPROL-XL) 25 MG 24 hr tablet [id number] Yes Take 0.5 tablets (12.5 mg total) by mouth daily. Debbrah Alar, NP Taking Active   nitroGLYCERIN (NITROSTAT) 0.4 MG SL tablet [id number] Yes Place 1 tablet (0.4 mg total) under the tongue every 5 (five) minutes as needed for chest pain. Berniece Salines, DO Taking Active   nystatin powder [id number] Yes APPLY  POWDER TOPICALLY TO AFFECTED AREA 2 TIMES DAILY Debbrah Alar, NP Taking Active   pantoprazole (PROTONIX) 40 MG tablet [id number] Yes Take 1 tablet (40 mg total) by mouth 2 (two) times daily. Debbrah Alar, NP Taking Active   potassium chloride SA (KLOR-CON M) 20 MEQ tablet [id number] Yes Take 1 tablet (20 mEq total) by mouth daily. Debbrah Alar, NP Taking Active   sucralfate (CARAFATE) 1 g tablet [id number] Yes TAKE ONE TABLET BY MOUTH FOUR TIMES DAILY. MAY cut pill in half AND DISSOLVE in water prior TO drinking Debbrah Alar, NP Taking Active             Patient Active Problem List   Diagnosis Date Noted   Left otitis media 02/16/2021   Back pain 12/03/2020   Acute cystitis without hematuria 12/03/2020   Grief reaction 12/03/2020   Fatty liver 12/01/2020   Other chest pain 09/17/2020   Coronary artery disease involving native coronary artery of native heart 98/92/1194   Metabolic syndrome 17/40/8144   Prediabetes 09/17/2020   Allergy 09/16/2020   Anxiety 09/16/2020   Hypertension 09/16/2020   Vertigo 09/16/2020   Dark stools 09/08/2020   Dysphagia    Esophageal stricture    Hiatal hernia    Epigastric pain    Hypothyroid 01/25/2018   Mild CAD 10/03/2017   Stress-induced cardiomyopathy 09/19/2017   COPD exacerbation ([place]) 09/14/2017   Hypokalemia 09/14/2017   Non-ST elevation (NSTEMI) myocardial infarction [facility]) - due to Demand Infarction 09/14/2017   CKD (chronic kidney disease) 04/04/2017   HLD (hyperlipidemia)    Allergic rhinitis    Gout 02/28/2017   Depression with anxiety  02/28/2017   Epigastric abdominal pain 12/22/2016   COPD with chronic bronchitis ([place]) 12/22/2016   GERD (gastroesophageal reflux disease) 12/22/2016   DOE (dyspnea on exertion) 12/22/2016  Peptic ulcer 01/04/2003    Immunization History  Administered Date(s) Administered   Fluad Quad(high Dose 65+) 03/14/2020, 12/03/2020   Influenza, High Dose Seasonal PF 01/23/2018, 12/07/2018   Influenza-Unspecified 12/23/2018   PFIZER(Purple Top)SARS-COV-2 Vaccination 05/14/2019, 06/08/2019   Pneumococcal Conjugate-13 07/16/2015   Pneumococcal Polysaccharide-23 12/23/2016   Tdap 09/02/2009, 04/05/2017    Conditions to be addressed/monitored: CAD, HTN, HLD, COPD, Anxiety, Depression and gout, GERD ; hypothyroidism  Care Plan : General Pharmacy (Adult)  Updates made by Cherre Robins, RPH-CPP since 03/31/2021 12:00 AM     Problem: Medication management and Monitoring and Coordination of Care for Chronic Conditions   Priority: High  Onset Date: 09/02/2020     Long-Range Goal: Medication management   Start Date: 09/02/2020  Recent Progress: On track  Priority: High  Note:     Current Barriers:  Unable to achieve control of hyperlipidemia  Does not adhere to prescribed medication regimen Does not maintain contact with provider office  Pharmacist Clinical Goal(s):  Over the next 90 days, patient will achieve adherence to monitoring guidelines and medication adherence to achieve therapeutic efficacy achieve control of hyperlipidemia as evidenced by LDL < 70 adhere to prescribed medication regimen as evidenced by filling prescriptions on times contact provider office for questions/concerns as evidenced notation of same in electronic health record through collaboration with PharmD and provider.   Interventions: 1:1 collaboration with Debbrah Alar, NP regarding development and update of comprehensive plan of care as evidenced by provider attestation and co-signature Inter-disciplinary  care team collaboration (see longitudinal plan of care) Comprehensive medication review performed; medication list updated in electronic medical record  Hypertension: Controlled though blood pressure was a little low at last office visit BP goal is:  <130/80  Patient is not checking BP at home - states she does not have BP cuff.  Patient has failed these meds in the past: None noted  Current Regimen: Metoprolol Succinate 25mg  0.5 tab daily Lisinopril 2.5mg  daily Interventions:  Reviewed signs and symptoms of low blood pressure to monitor for.  Discussed blood pressure  goal Continue current medications for blood pressure    Hyperlipidemia / history of MI: Not controlled; LDL goal < 70  Patient has failed these meds in past: None noted   In September patient reported she just stopped taking atorvastatin because she was taking so many other medications Current therapy Atorvastatin 80mg  daily (increased 08/18/2020) Ezetimibe 10mg  daily (added 09/17/2020)  Metoprolol Succinate 25mg  1/2 tab daily Aspirin 81mg  daily  Nitroglycerin 0.4mg  - place 1 tablet under tongue as needed for chest pain, may repeat in 5 minutes if chest pain not relieved. Interventions:  Discussed LDL goal Discussed importance of taking atorvastatin daily to both lower cholesterol and prevent ASCVD / recurrent MI; Adherence has improved since she started adherence packaging. Consider rechecking lipids with next labs since has not been rechecked since atorvastatin increased and ezetimibe added Reminded patient to reschedule appointment with cardiologist.   Chronic Obstructive Pulmonary Disease: Controlled Current treatment: Albuterol solution for nebulizer - use in nebulizer up to every 6 hours as needed for shortness of breath No exacerbations requiring treatment in the last 6 months  Had phone appointment with Health Coach for COPD on 03/06/2021 Interventions: None  Depression/Anxiety:  Improving; goal:  decrease use of diazepam and decrease number of days per month with symptoms of grief / depression Current treatment: Escitalopram 20mg  daily Diazepam 10mg  every 12 hours as needed  Patient has failed these meds in past: citalopram (ineffective) She still  reports some days that she is sad when she thinks about her brother's passing. Patient has declined referral in past for grief counseling.  Patient was using diazepam frequently but today she reports that she takes usually only 1 tablet at bedtime but sometimes takes a second dose if needed.  Her refill history shows that she last filled diazepam for #10 tablets on 03/03/2021 Interventions:  Discussed adherence and importance of taking escitalopram for depression / anxiety Encouraged patient to continue to use diazepam sparingly - only at night if able.  Consider grief counseling (patient continues to decline)  Hypothyroidism:  Patient is currently controlled on the following medications:  levothyroxine 2mcg daily  TSH was WNL at last check on 08/18/2020 Patient has failed these meds in past: None noted  Intervention: (addressed at previous visit)  Continue current therapy   Gout:   Goal Uric Acid < 6 mg/dL   Last uric acid was not at goal (UA = 8.4 on 03/14/2020) but patient denies symptoms of gout at this time.  Current medications:  Allopurinol 100mg  - take 2 tablets = 200mg  daily Colchicine 0.6mg  up to bid as needed for acute gout Medications that may increase uric acid levels: Aspirin Last gout flare: Feb 2022 Patient has failed these meds in past: None noted   Interventions: (addressed at previous visit) Reviewed preventative versus treatment medications for gout.  Continue current therapy Consider checking Uric acid with next labs.    Medication management:   She started medication adherence packaging 01/08/2021 and really likes it. Has helped her with remembering to take her medication on time.  Noted that patient has  requested prescriptions to be sent to [facility] but she does not have [employer]. She will need another refll of maintenance medicaitons 04/01/2021.  Consulted with patient and she is unsure if her son in law changed her Medicare plan to [facility] or kept her with [employer]. She will let our office know.  [facility] and they are holding prescriptions that were sent in until April 05, 2021 to see if patient will have [place], they will fill her maintenance medications at least once more for 30 days and supply in packaging.  Current pharmacy:  [facility] Intervention:  Comprehensive medication review completed.  Coordinated packaging. Discussed plan to get 1 more refill from [facility]. Will transition to [place] if [employer] Rossetti is her insurance in 2023.  Reviewed with patient which medications are to be used as needed (these will not be included in packaging).     Patient Goals/Self-Care Activities Over the next 90 days, patient will:  take medications as prescribed, focus on medication adherence by filing prescriptions at one pharmacy, and call if any issues with medications   Follow Up Plan:  will check back in with patient in 1 month to assist with medication refills and transition to [place]         Medication Assistance: None required.  Patient affirms current coverage meets needs.  Patient's preferred pharmacy is:  [place], [place] - [address] [place] [address] Phone: [phone] Fax: [phone]  [place], [place] - [address] [place] [address] Phone: [phone] Fax: [phone]  [facility] - [place], [address] [place] [address] Phone: [phone] Fax: [phone]   Follow Up:  Patient  agrees to Care Plan and Follow-up.  Plan: follow up in 1 month  Cherre Robins, PharmD Clinical Pharmacist [facility] [place] [phone]

## 2021-04-01 ENCOUNTER — Telehealth: Payer: Self-pay

## 2021-04-01 NOTE — Telephone Encounter (Signed)
° °  Telephone encounter was:  Successful.  04/01/2021 Name: TANGIE STAY MRN: [id number] DOB: [birth year]  LAURENA VALKO is a 80 y.o. year old female who is a primary care patient of [name], NP . The community resource team was consulted for assistance with Transportation Needs   Care guide performed the following interventions:  Patient has been educated on the use of Transportation. Information on transportation has been sent out by Jeanine CG. Per CG: going to mail her information on [employer] transportation and also on [place] transportation .  Follow Up Plan:  No further follow up planned at this time. The patient has been provided with needed resources. Pt advised at this time, she still does not need any rides. Information needed only.  [facility] Guide, Embedded Care Coordination [facility]  [place], [place] [address]  Main Phone: [phone]   E-mail: [email or web address]  Website: [email or web address]

## 2021-04-02 ENCOUNTER — Other Ambulatory Visit: Payer: Self-pay

## 2021-04-02 MED ORDER — ISOSORBIDE MONONITRATE ER 30 MG PO TB24: 30.0000 mg | ORAL_TABLET | Freq: Every day | ORAL | 3 refills | Status: DC

## 2021-04-02 NOTE — Telephone Encounter (Signed)
Please send 30 mg Imdur to her pharmacy.

## 2021-04-02 NOTE — Progress Notes (Signed)
Prescription sent to pharmacy.

## 2021-04-03 ENCOUNTER — Other Ambulatory Visit: Payer: Self-pay | Admitting: Family

## 2021-04-03 NOTE — Telephone Encounter (Signed)
Prescription sent to pharmacy yesterday.

## 2021-04-03 NOTE — Telephone Encounter (Signed)
Veronica Wolf Pt  Requesting: diazepam 10mg   Contract: 03/14/2020 UDS: 03/05/2021 Last Visit: 02/16/2021 Next Visit: None Last Refill: 03/19/2021 #10 and 0RF  Please Advise

## 2021-04-04 DIAGNOSIS — F418 Other specified anxiety disorders: Secondary | ICD-10-CM

## 2021-04-04 DIAGNOSIS — M109 Gout, unspecified: Secondary | ICD-10-CM | POA: Diagnosis not present

## 2021-04-04 DIAGNOSIS — Z87891 Personal history of nicotine dependence: Secondary | ICD-10-CM

## 2021-04-04 DIAGNOSIS — E785 Hyperlipidemia, unspecified: Secondary | ICD-10-CM

## 2021-04-04 DIAGNOSIS — I10 Essential (primary) hypertension: Secondary | ICD-10-CM

## 2021-04-06 ENCOUNTER — Other Ambulatory Visit: Payer: Self-pay | Admitting: Family

## 2021-04-06 NOTE — Telephone Encounter (Signed)
Patient is requesting a refill of the following medications: Requested Prescriptions   Pending Prescriptions Disp Refills   diazepam (VALIUM) 10 MG tablet [Pharmacy Med Name: diazepam 10 mg tablet] 10 tablet 0    Sig: TAKE ONE TABLET BY MOUTH EVERY TWELVE HOURS AS NEEDED FOR ANXIETY    Date of patient request: 04/05/21 Last office visit: 02/16/21 Date of last refill: 03/19/21 Last refill amount: 10 Follow up time period per chart: 04/20/21 with the pharm, none with provider yet.

## 2021-04-08 ENCOUNTER — Other Ambulatory Visit: Payer: Self-pay | Admitting: Family

## 2021-04-08 DIAGNOSIS — R42 Dizziness and giddiness: Secondary | ICD-10-CM

## 2021-04-13 ENCOUNTER — Telehealth: Payer: Self-pay | Admitting: Family

## 2021-04-13 ENCOUNTER — Ambulatory Visit: Payer: Medicare HMO

## 2021-04-13 ENCOUNTER — Ambulatory Visit (INDEPENDENT_AMBULATORY_CARE_PROVIDER_SITE_OTHER): Payer: Medicare HMO

## 2021-04-13 VITALS — Ht 64.0 in | Wt 182.0 lb

## 2021-04-13 DIAGNOSIS — Z0000 Encounter for general adult medical examination without abnormal findings: Secondary | ICD-10-CM

## 2021-04-13 DIAGNOSIS — Z780 Asymptomatic menopausal state: Secondary | ICD-10-CM | POA: Diagnosis not present

## 2021-04-13 DIAGNOSIS — Z1231 Encounter for screening mammogram for malignant neoplasm of breast: Secondary | ICD-10-CM | POA: Diagnosis not present

## 2021-04-13 NOTE — Progress Notes (Signed)
Subjective:   Veronica Wolf is a 81 y.o. female who presents for an Initial Medicare Annual Wellness Visit.  I connected with Veronica Wolf today by telephone and verified that I am speaking with the correct person using two identifiers. Location patient: home Location provider: work Persons participating in the virtual visit: patient, Engineer, civil (consulting).    I discussed the limitations, risks, security and privacy concerns of performing an evaluation and management service by telephone and the availability of in person appointments. I also discussed with the patient that there may be a patient responsible charge related to this service. The patient expressed understanding and verbally consented to this telephonic visit.    Interactive audio and video telecommunications were attempted between this provider and patient, however failed, due to patient having technical difficulties OR patient did not have access to video capability.  We continued and completed visit with audio only.  Some vital signs may be absent or patient reported.   Time Spent with patient on telephone encounter: 50 minutes   Review of Systems     Cardiac Risk Factors include: advanced age (>79men, >29 women);hypertension;dyslipidemia;obesity (BMI >30kg/m2)     Objective:    Today's Vitals   04/13/21 0742 04/13/21 0743  Weight: 182 lb (82.6 kg)   Height: 5\' 4"  (1.626 m)   PainSc:  7    Body mass index is 31.24 kg/m.  Advanced Directives 04/13/2021 08/20/2020 08/13/2020 07/31/2020 06/11/2020 06/11/2020 10/09/2019  Does Patient Have a Medical Advance Directive? No No No No No Yes No  Type of Advance Directive - - - - - 12/10/2019;Living will -  Does patient want to make changes to medical advance directive? - - - - - - -  Copy of Healthcare Power of Attorney in Chart? - - - - - No - copy requested -  Would patient like information on creating a medical advance directive? Yes (MAU/Ambulatory/Procedural Areas - Information  given) Yes (ED - Information included in AVS) Yes (MAU/Ambulatory/Procedural Areas - Information given) Yes (ED - Information included in AVS) No - Patient declined - No - Patient declined    Current Medications (verified) Outpatient Encounter Medications as of 04/13/2021  Medication Sig   albuterol (PROVENTIL) (2.5 MG/3ML) 0.083% nebulizer solution USE 1 VIAL IN NEBULIZER EVERY 6 HOURS AS NEEDED FOR WHEEZING AND FOR SHORTNESS OF BREATH   allopurinol (ZYLOPRIM) 100 MG tablet Take 2 tablets (200 mg total) by mouth daily.   aspirin 81 MG EC tablet Take 1 tablet (81 mg total) by mouth daily. Swallow whole.   atorvastatin (LIPITOR) 80 MG tablet Take 1 tablet (80 mg total) by mouth daily.   colchicine 0.6 MG tablet Take 0.6 mg by mouth daily as needed (gout).   diazepam (VALIUM) 10 MG tablet TAKE ONE TABLET BY MOUTH EVERY TWELVE HOURS AS NEEDED FOR ANXIETY   dicyclomine (BENTYL) 10 MG capsule Take 1 capsule (10 mg total) by mouth in the morning, at noon, in the evening, and at bedtime.   escitalopram (LEXAPRO) 20 MG tablet Take 1 tablet (20 mg total) by mouth daily.   ezetimibe (ZETIA) 10 MG tablet Take 1 tablet (10 mg total) by mouth daily.   famotidine (PEPCID) 20 MG tablet Take 1 tablet (20 mg total) by mouth 2 (two) times daily. In the morning and at bedtime   isosorbide mononitrate (IMDUR) 30 MG 24 hr tablet Take 1 tablet (30 mg total) by mouth daily.   levothyroxine (SYNTHROID) 25 MCG tablet TAKE ONE TABLET  BY MOUTH EVERY MORNING   lisinopril (ZESTRIL) 2.5 MG tablet Take 1 tablet (2.5 mg total) by mouth daily.   meclizine (ANTIVERT) 25 MG tablet TAKE ONE TABLET BY MOUTH twive daily AS NEEDED FOR dizziness   metoprolol succinate (TOPROL-XL) 25 MG 24 hr tablet Take 0.5 tablets (12.5 mg total) by mouth daily.   nitroGLYCERIN (NITROSTAT) 0.4 MG SL tablet Place 1 tablet (0.4 mg total) under the tongue every 5 (five) minutes as needed for chest pain.   nystatin (MYCOSTATIN/NYSTOP) powder APPLY POWDER  TOPICALLY TO AFFECTED AREA 2 TIMES DAILY   pantoprazole (PROTONIX) 40 MG tablet Take 1 tablet (40 mg total) by mouth 2 (two) times daily.   potassium chloride SA (KLOR-CON M) 20 MEQ tablet TAKE ONE TABLET BY MOUTH AT NOON   sucralfate (CARAFATE) 1 g tablet TAKE ONE TABLET BY MOUTH FOUR TIMES DAILY. MAY cut pill in half AND DISSOLVE in water prior TO drinking   No facility-administered encounter medications on file as of 04/13/2021.    Allergies (verified) Ibuprofen   History: Past Medical History:  Diagnosis Date   Allergic rhinitis    Allergy    seasonal allergies   Anxiety    CKD (chronic kidney disease) 04/04/2017   COPD (chronic obstructive pulmonary disease) (HCC)    uses inhaler   Depression    Fatty liver    GERD (gastroesophageal reflux disease)    Gout    hx of   Hyperlipidemia    on meds   Hypertension    on meds   Myocardial infarction [facility])    Peptic ulcer 01/04/2003   Vertigo    Past Surgical History:  Procedure Laterality Date   ABDOMINAL HYSTERECTOMY     APPENDECTOMY     BIOPSY  08/13/2020   Procedure: BIOPSY;  Surgeon: Shellia Cleverly, DO;  Location: [facility];  Service: Gastroenterology;;   ESOPHAGOGASTRODUODENOSCOPY (EGD) WITH PROPOFOL N/A 08/13/2020   Procedure: ESOPHAGOGASTRODUODENOSCOPY (EGD) WITH PROPOFOL;  Surgeon: Shellia Cleverly, DO;  Location: [facility];  Service: Gastroenterology;  Laterality: N/A;   LEFT HEART CATH AND CORONARY ANGIOGRAPHY N/A 06/13/2017   Procedure: LEFT HEART CATH AND CORONARY ANGIOGRAPHY;  Surgeon: Marykay Lex, MD;  Location: [facility] INVASIVE CV LAB;  Service: Cardiovascular;  Laterality: N/A;   RIGHT/LEFT HEART CATH AND CORONARY ANGIOGRAPHY N/A 09/16/2017   Procedure: RIGHT/LEFT HEART CATH AND CORONARY ANGIOGRAPHY;  Surgeon: Swaziland, Peter M, MD;  Location: [facility] INVASIVE CV LAB;  Service: Cardiovascular;  Laterality: N/A;   SAVORY DILATION N/A 08/13/2020   Procedure: SAVORY DILATION;  Surgeon: Shellia Cleverly, DO;   Location: [facility];  Service: Gastroenterology;  Laterality: N/A;   ULTRASOUND GUIDANCE FOR VASCULAR ACCESS  09/16/2017   Procedure: Ultrasound Guidance For Vascular Access;  Surgeon: Swaziland, Peter M, MD;  Location: [facility] INVASIVE CV LAB;  Service: Cardiovascular;;   WISDOM TOOTH EXTRACTION     Family History  Problem Relation Age of Onset   Breast cancer Mother    Stroke Father    Stomach cancer Neg Hx    Colon cancer Neg Hx    Pancreatic cancer Neg Hx    Esophageal cancer Neg Hx    Colon polyps Neg Hx    Rectal cancer Neg Hx    Social History   Socioeconomic History   Marital status: Widowed    Spouse name: Not on file   Number of children: 1   Years of education: Not on file   Highest education level: Not on file  Occupational History   Occupation: Retired  Tobacco Use   Smoking status: Former    Types: Cigarettes    Quit date: 04/24/2000    Years since quitting: 20.9   Smokeless tobacco: Never  Vaping Use   Vaping Use: Never used  Substance and Sexual Activity   Alcohol use: Not Currently    Comment: has previous hx of ETOH abuse, quit 2014   Drug use: No   Sexual activity: Never  Other Topics Concern   Not on file  Social History Narrative   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 [place]   Grew up [place]   Has daughter locally   Social Determinants of Corporate investment bankerHealth   Financial Resource Strain: Low Risk    Difficulty of Paying Living Expenses: Not very hard  Food Insecurity: No Food Insecurity   Worried About Programme researcher, broadcasting/film/videounning Out of Food in the Last Year: Never true   Baristaan Out of Food in the Last Year: Never true  Transportation Needs: Personal assistantUnmet Transportation Needs   Lack of Transportation (Medical): Yes   Lack of Transportation (Non-Medical): Yes  Physical Activity: Inactive   Days of Exercise per Week: 0 days   Minutes of Exercise per Session: 0 min  Stress: Not on file  Social Connections: Not on file    Tobacco Counseling Counseling given: Not  Answered   Clinical Intake:  Pre-visit preparation completed: Yes  Pain : 0-10 Pain Score: 7  Pain Type: Chronic pain Pain Location: Chest (patient states this is nothing new & she has been having this issue off & on for months & has seen provider for it) Pain Onset: More than a month ago Pain Frequency: Intermittent     BMI - recorded: 31.24 Nutritional Status: BMI > 30  Obese Nutritional Risks: None Diabetes: No  How often do you need to have someone help you when you read instructions, pamphlets, or other written materials from your doctor or pharmacy?: 1 - Never  Diabetic?No  Interpreter Needed?: No  Information entered by :: Thomasenia SalesMartha Roselind Klus LPN   Activities of Daily Living In your present state of health, do you have any difficulty performing the following activities: 04/13/2021 12/03/2020  Hearing? N N  Vision? N N  Difficulty concentrating or making decisions? Y N  Comment occasionally -  Walking or climbing stairs? N N  Dressing or bathing? N N  Doing errands, shopping? N N  Preparing Food and eating ? N -  Using the Toilet? N -  In the past six months, have you accidently leaked urine? N -  Do you have problems with loss of bowel control? N -  Managing your Medications? N -  Managing your Finances? N -  Housekeeping or managing your Housekeeping? N -  Some recent data might be hidden    Patient Care Team: [name]'Sullivan, Melissa, NP as PCP - General (Internal Medicine) Revankar, Aundra Dubinajan R, MD as PCP - Cardiology (Cardiology) Hoyt KochWine, Pryor OchoaJill A, RN as [facility] Care Management Eckard, Tammy, RPH-CPP (Pharmacist)  Indicate any recent Medical Services you may have received from other than [facility] providers in the past year (date may be approximate).     Assessment:   This is a routine wellness examination for [place].  Hearing/Vision screen Hearing Screening - Comments:: No issues Vision Screening - Comments:: Last eye exam-2 years ago  Dietary issues  and exercise activities discussed: Current Exercise Habits: The patient does not participate in regular exercise at present, Exercise limited by: None identified  Goals Addressed             This Visit's Progress    Patient Stated       Increase activity       Depression Screen PHQ 2/9 Scores 04/13/2021 03/06/2021 12/03/2020 08/18/2020 07/31/2020 10/09/2019 12/26/2017  PHQ - 2 Score 1 3 3 4 1  0 3  PHQ- 9 Score - 9 8 10  - - 11    Fall Risk Fall Risk  04/13/2021 03/06/2021 12/16/2020 08/20/2020 08/18/2020  Falls in the past year? 0 0 0 0 0  Number falls in past yr: 0 0 0 0 0  Injury with Fall? 0 0 0 0 0  Risk for fall due to : - Impaired mobility;Impaired balance/gait;History of fall(s) Impaired balance/gait;Impaired mobility Impaired mobility;Impaired balance/gait -  Follow up Falls prevention discussed Falls prevention discussed;Education provided;Falls evaluation completed Falls prevention discussed;Education provided;Falls evaluation completed Falls prevention discussed;Education provided;Falls evaluation completed -    FALL RISK PREVENTION PERTAINING TO THE HOME:  Any stairs in or around the home? Yes  If so, are there any without handrails? No  Home free of loose throw rugs in walkways, pet beds, electrical cords, etc? No pt aware of fall risk Adequate lighting in your home to reduce risk of falls? Yes   ASSISTIVE DEVICES UTILIZED TO PREVENT FALLS:  Life alert? No  Use of a cane, walker or w/c? No  Grab bars in the bathroom? No  Shower chair or bench in shower? No  Elevated toilet seat or a handicapped toilet? No   TIMED UP AND GO:  Was the test performed? No . Phone visit   Cognitive Function:Normal cognitive status assessed by this Nurse Health Advisor. No abnormalities found.          Immunizations Immunization History  Administered Date(s) Administered   Fluad Quad(high Dose 65+) 03/14/2020, 12/03/2020   Influenza, High Dose Seasonal PF 01/23/2018, 12/07/2018    Influenza-Unspecified 12/23/2018   PFIZER(Purple Top)SARS-COV-2 Vaccination 05/14/2019, 06/08/2019   Pneumococcal Conjugate-13 07/16/2015   Pneumococcal Polysaccharide-23 12/23/2016   Tdap 09/02/2009, 04/05/2017    TDAP status: Up to date  Flu Vaccine status: Up to date  Pneumococcal vaccine status: Up to date  Covid-19 vaccine status: Information provided on how to obtain vaccines.   Qualifies for Shingles Vaccine? Yes   Zostavax completed No   Shingrix Completed?: No.    Education has been provided regarding the importance of this vaccine. Patient has been advised to call insurance company to determine out of pocket expense if they have not yet received this vaccine. Advised may also receive vaccine at local pharmacy or Health Dept. Verbalized acceptance and understanding.  Screening Tests Health Maintenance  Topic Date Due   Zoster Vaccines- Shingrix (1 of 2) Never done   COVID-19 Vaccine (3 - Pfizer risk series) 07/06/2019   TETANUS/TDAP  04/06/2027   Pneumonia Vaccine 56+ Years old  Completed   INFLUENZA VACCINE  Completed   DEXA SCAN  Completed   HPV VACCINES  Aged Out    Health Maintenance  Health Maintenance Due  Topic Date Due   Zoster Vaccines- Shingrix (1 of 2) Never done   COVID-19 Vaccine (3 - Pfizer risk series) 07/06/2019    Colorectal cancer screening: No longer required.   Mammogram status: Ordered today. Pt provided with contact info and advised to call to schedule appt.   Bone Density status: Ordered today. Pt provided with contact info and advised to call to schedule appt.  Lung Cancer Screening: (Low  Dose CT Chest recommended if Age 57-80 years, 30 pack-year currently smoking OR have quit w/in 15years.) does not qualify.     Additional Screening:  Hepatitis C Screening: does not qualify  Vision Screening: Recommended annual ophthalmology exams for early detection of glaucoma and other disorders of the eye. Is the patient up to date with their  annual eye exam?  No  Who is the provider or what is the name of the office in which the patient attends annual eye exams? Unknown-pt to make an appt soon   Dental Screening: Recommended annual dental exams for proper oral hygiene  Community Resource Referral / Chronic Care Management: CRR required this visit?  Yes for social enrichment  CCM required this visit?  No      Plan:     I have personally reviewed and noted the following in the patients chart:   Medical and social history Use of alcohol, tobacco or illicit drugs  Current medications and supplements including opioid prescriptions. Patient is not currently taking opioid prescriptions. Functional ability and status Nutritional status Physical activity Advanced directives List of other physicians Hospitalizations, surgeries, and ER visits in previous 12 months Vitals Screenings to include cognitive, depression, and falls Referrals and appointments  In addition, I have reviewed and discussed with patient certain preventive protocols, quality metrics, and best practice recommendations. A written personalized care plan for preventive services as well as general preventive health recommendations were provided to patient.   Due to this being a telephonic visit, the after visit summary with patients personalized plan was offered to patient via mail or my-chart. Per request, patient was mailed a copy of AVS.   Roanna RaiderMartha A Shenaya Lebo, LPN   1/6/10961/12/2021  Nurse Health Advisor  Nurse Notes: None

## 2021-04-13 NOTE — Telephone Encounter (Signed)
[facility].  [facility] - [place], [place] - [address]  [address], [place] [address]  Phone:  [phone]  Fax:  [phone]

## 2021-04-13 NOTE — Patient Instructions (Signed)
[name] , Thank you for taking time to complete your Medicare Wellness Visit. I appreciate your ongoing commitment to your health goals. Please review the following plan we discussed and let me know if I can assist you in the future.   Screening recommendations/referrals: Colonoscopy: No longer required Mammogram: Ordered today. Someone will call you to schedule. Bone Density: Ordered today. Someone will call you to schedule. Recommended yearly ophthalmology/optometry visit for glaucoma screening and checkup Recommended yearly dental visit for hygiene and checkup  Vaccinations: Influenza vaccine: Up to date Pneumococcal vaccine: Up to date Tdap vaccine: Up to date Shingles vaccine: Discuss with pharmacy   Covid-19:Booster available at the pharmacy  Advanced directives: Information mailed today.  Conditions/risks identified: See problem list  Next appointment: Follow up in one year for your annual wellness visit    Preventive Care 65 Years and Older, Female Preventive care refers to lifestyle choices and visits with your health care provider that can promote health and wellness. What does preventive care include? A yearly physical exam. This is also called an annual well check. Dental exams once or twice a year. Routine eye exams. Ask your health care provider how often you should have your eyes checked. Personal lifestyle choices, including: Daily care of your teeth and gums. Regular physical activity. Eating a healthy diet. Avoiding tobacco and drug use. Limiting alcohol use. Practicing safe sex. Taking low-dose aspirin every day. Taking vitamin and mineral supplements as recommended by your health care provider. What happens during an annual well check? The services and screenings done by your health care provider during your annual well check will depend on your age, overall health, lifestyle risk factors, and family history of disease. Counseling  Your health care  provider may ask you questions about your: Alcohol use. Tobacco use. Drug use. Emotional well-being. Home and relationship well-being. Sexual activity. Eating habits. History of falls. Memory and ability to understand (cognition). Work and work Astronomer. Reproductive health. Screening  You may have the following tests or measurements: Height, weight, and BMI. Blood pressure. Lipid and cholesterol levels. These may be checked every 5 years, or more frequently if you are over 28 years old. Skin check. Lung cancer screening. You may have this screening every year starting at age 11 if you have a 30-pack-year history of smoking and currently smoke or have quit within the past 15 years. Fecal occult blood test (FOBT) of the stool. You may have this test every year starting at age 76. Flexible sigmoidoscopy or colonoscopy. You may have a sigmoidoscopy every 5 years or a colonoscopy every 10 years starting at age 59. Hepatitis C blood test. Hepatitis B blood test. Sexually transmitted disease (STD) testing. Diabetes screening. This is done by checking your blood sugar (glucose) after you have not eaten for a while (fasting). You may have this done every 1-3 years. Bone density scan. This is done to screen for osteoporosis. You may have this done starting at age 53. Mammogram. This may be done every 1-2 years. Talk to your health care provider about how often you should have regular mammograms. Talk with your health care provider about your test results, treatment options, and if necessary, the need for more tests. Vaccines  Your health care provider may recommend certain vaccines, such as: Influenza vaccine. This is recommended every year. Tetanus, diphtheria, and acellular pertussis (Tdap, Td) vaccine. You may need a Td booster every 10 years. Zoster vaccine. You may need this after age 95. Pneumococcal 13-valent conjugate (PCV13)  vaccine. One dose is recommended after age  98. Pneumococcal polysaccharide (PPSV23) vaccine. One dose is recommended after age 58. Talk to your health care provider about which screenings and vaccines you need and how often you need them. This information is not intended to replace advice given to you by your health care provider. Make sure you discuss any questions you have with your health care provider. Document Released: 04/18/2015 Document Revised: 12/10/2015 Document Reviewed: 01/21/2015 Elsevier Interactive Patient Education  2017 [place] Prevention in the Home Falls can cause injuries. They can happen to people of all ages. There are many things you can do to make your home safe and to help prevent falls. What can I do on the outside of my home? Regularly fix the edges of walkways and driveways and fix any cracks. Remove anything that might make you trip as you walk through a door, such as a raised step or threshold. Trim any bushes or trees on the path to your home. Use bright outdoor lighting. Clear any walking paths of anything that might make someone trip, such as rocks or tools. Regularly check to see if handrails are loose or broken. Make sure that both sides of any steps have handrails. Any raised decks and porches should have guardrails on the edges. Have any leaves, snow, or ice cleared regularly. Use sand or salt on walking paths during winter. Clean up any spills in your garage right away. This includes oil or grease spills. What can I do in the bathroom? Use night lights. Install grab bars by the toilet and in the tub and shower. Do not use towel bars as grab bars. Use non-skid mats or decals in the tub or shower. If you need to sit down in the shower, use a plastic, non-slip stool. Keep the floor dry. Clean up any water that spills on the floor as soon as it happens. Remove soap buildup in the tub or shower regularly. Attach bath mats securely with double-sided non-slip rug tape. Do not have throw  rugs and other things on the floor that can make you trip. What can I do in the bedroom? Use night lights. Make sure that you have a light by your bed that is easy to reach. Do not use any sheets or blankets that are too big for your bed. They should not hang down onto the floor. Have a firm chair that has side arms. You can use this for support while you get dressed. Do not have throw rugs and other things on the floor that can make you trip. What can I do in the kitchen? Clean up any spills right away. Avoid walking on wet floors. Keep items that you use a lot in easy-to-reach places. If you need to reach something above you, use a strong step stool that has a grab bar. Keep electrical cords out of the way. Do not use floor polish or wax that makes floors slippery. If you must use wax, use non-skid floor wax. Do not have throw rugs and other things on the floor that can make you trip. What can I do with my stairs? Do not leave any items on the stairs. Make sure that there are handrails on both sides of the stairs and use them. Fix handrails that are broken or loose. Make sure that handrails are as long as the stairways. Check any carpeting to make sure that it is firmly attached to the stairs. Fix any carpet that is loose  or worn. Avoid having throw rugs at the top or bottom of the stairs. If you do have throw rugs, attach them to the floor with carpet tape. Make sure that you have a light switch at the top of the stairs and the bottom of the stairs. If you do not have them, ask someone to add them for you. What else can I do to help prevent falls? Wear shoes that: Do not have high heels. Have rubber bottoms. Are comfortable and fit you well. Are closed at the toe. Do not wear sandals. If you use a stepladder: Make sure that it is fully opened. Do not climb a closed stepladder. Make sure that both sides of the stepladder are locked into place. Ask someone to hold it for you, if  possible. Clearly mark and make sure that you can see: Any grab bars or handrails. First and last steps. Where the edge of each step is. Use tools that help you move around (mobility aids) if they are needed. These include: Canes. Walkers. Scooters. Crutches. Turn on the lights when you go into a dark area. Replace any light bulbs as soon as they burn out. Set up your furniture so you have a clear path. Avoid moving your furniture around. If any of your floors are uneven, fix them. If there are any pets around you, be aware of where they are. Review your medicines with your doctor. Some medicines can make you feel dizzy. This can increase your chance of falling. Ask your doctor what other things that you can do to help prevent falls. This information is not intended to replace advice given to you by your health care provider. Make sure you discuss any questions you have with your health care provider. Document Released: 01/16/2009 Document Revised: 08/28/2015 Document Reviewed: 04/26/2014 Elsevier Interactive Patient Education  2017 [employer].

## 2021-04-13 NOTE — Telephone Encounter (Signed)
[facility].  [facility] - [place], [place] - [address]  [address], [place] [address]  Phone:  [phone]  Fax:  [phone]

## 2021-04-15 NOTE — Telephone Encounter (Signed)
Patient reports she will like to [facility] due to packaging is better and easier for her to understand.  °Patient advised to call [facility]. She verbalized understandinng °

## 2021-04-15 NOTE — Telephone Encounter (Signed)
Patient reports she will like to [facility] due to packaging is better and easier for her to understand.  Patient advised to [facility]. She verbalized understandinng

## 2021-04-17 ENCOUNTER — Other Ambulatory Visit: Payer: Self-pay | Admitting: *Deleted

## 2021-04-17 NOTE — Patient Outreach (Signed)
[facility]) Care Management  04/17/2021  Veronica Wolf 05/03/1940 [id number]  Unsuccessful outreach attempt made to patient. Patient answered the phone and explained that she would not be able to speak today due to not feeling well. Patient did state that she has been sick with cold symptoms and that she has been in contact with her PCP. Nurse advised the patient to make an appointment with her PCP if she becomes any worse or if her symptoms persist and patient verbalized understanding. Patient reports that she switched to [facility]. Nurse verified in system that [facility] was effective on 04/05/21. Advised patient that her case would be transferred to another RN who manages [facility] and she voiced understanding and agreement.  Nurse did explain that until she is contacted by the new RNCM that she could call this nurse for assistance given that she is currently not feeling well. Patient was appreciative and stated she would contact nurse if needed. Patient did confirm that she has this nurse's contact number.      Plan: Nurse will transfer case to [facility] RN CM for ongoing disease management, education and support.    Blanchie Serve RN, Mining engineer [facility] [phone] [email or web address]

## 2021-04-20 ENCOUNTER — Other Ambulatory Visit: Payer: Self-pay | Admitting: *Deleted

## 2021-04-20 ENCOUNTER — Telehealth: Payer: Self-pay | Admitting: *Deleted

## 2021-04-20 ENCOUNTER — Ambulatory Visit: Payer: Medicare HMO | Admitting: Pharmacist

## 2021-04-20 DIAGNOSIS — J449 Chronic obstructive pulmonary disease, unspecified: Secondary | ICD-10-CM

## 2021-04-20 DIAGNOSIS — Z79899 Other long term (current) drug therapy: Secondary | ICD-10-CM

## 2021-04-20 NOTE — Patient Instructions (Addendum)
[name] It was a pleasure speaking with you today.  I have attached a summary of our visit today and information about your health goals.   If you have any questions or concerns, please feel free to contact me either at the phone number below or with a MyChart message.   Keep up the good work!  Cherre Robins, PharmD Clinical Pharmacist [facility] [phone] (direct line)  [phone] (main office number)  CARE PLAN ENTRY  Hypertension Screening BP Readings from Last 3 Encounters:  02/16/21 (!) 102/56  01/09/21 (!) 143/93  12/03/20 (!) 110/55   Pharmacist Clinical Goal(s): Over the next 90 days, patient will work with PharmD and providers to achieve BP goal <130/80 Current regimen:  Metoprolol Succinate 25mg  0.5 tab daily Lisinopril 2.5mg  daily Interventions: Discussed blood pressure goal Discussed importance of taking metoprolol and lisinopril for heart health Patient self care activities - Over the next 90 days, patient will: Maintain blood pressure less than 130/80 Consider purchasing blood pressure cuff and check blood pressure 2 to 3 times per week and record Continue current medications    Hyperlipidemia / Heart health Lab Results  Component Value Date/Time   LDLCALC 125 (H) 08/18/2020 12:03 PM   LDLCALC 108 (H) 03/14/2020 02:39 PM   Pharmacist Clinical Goal(s): Over the next 90 days, patient will work with PharmD and providers to achieve LDL goal < 70 Current regimen:  Atorvastatin 80mg  daily  Ezetimibe 10mg  daily Aspirin 81mg  daily  Metoprolol succinate ER 25mg  - take 0.5 tablet daily Nitroglycerin 0.4mg  - place 1 tablet under the tongue as needed for chest pain, may repeat in 5 minutes if chest pain not relieved.  Interventions: Discussed LDL goal and importance of medication adherence Repeat LDL at next office visit since ezetimibe started Discussed adherence Patient self care activities - Over the next 90 days,  patient will: Continue medications currently prescribed to lower cholesterol and to prevent heart attack Rescheduled appointment with cardiologist  Anxiety / Depression:  Pharmacist Clinical Goal(s): Over the next 45 days, patient will work with PharmD and providers to maintain control of anxiety and depression symptoms Current regimen:  Escitalopram 20mg  daily  Diazepam 10mg  take 1 tablet every 12 hours if needed for anxiety Interventions: Discussed using diazepam only if needed. Limit use during daytime if able.  Patient self care activities - Over the next 90 days, patient will: Maintain current medication regimen for anxiety and depression  Remember to take diazepam only as needed.   Gout:  Pharmacist Clinical Goal(s): Over the next 90 days, patient will work with PharmD and providers to prevent acute gout episodes / lower uric acid Current regimen:  Allopurinol 100mg  - take 2 tablets = 200mg  daily (prevention) Colchicine 0.6mg  take up to twice a day as needed for gout attack Interventions: Discussed difference between maintenance / presentation medication and treatment Patient self care activities - Over the next 90 days, patient will: Maintain current medication regimen for gout  Pre-Diabetes Lab Results  Component Value Date/Time   HGBA1C 6.5 01/09/2021 08:10 AM   HGBA1C 6.2 (H) 09/17/2020 10:38 AM   Pharmacist Clinical Goal(s): Over the next 90 days, patient will work with PharmD and providers to maintain A1c goal <6.5% Current regimen:  Diet and exercise management   Interventions: Discussed the importance of limiting carbohydrates (30-45 grams per meal) Patient self care activities - Over the next 90 days, patient will: Maintain a1c <6.5%  Hypothyroidism Pharmacist Clinical Goal(s) Over the next  90 days, patient will work with PharmD and providers to maintain TSH within normal limits and reduce risk of symptoms associated with hypothyroidism Current regimen:   Euthyrox / levothyroxine 69mcg daily Interventions: Discussed importance of medication adherence Reminded to separate Euthyrox / levothyroxine dose by 30 minutes from breakfast and morning medications Patient self care activities - Over the next 90 days, patient will: Maintain hypothyroidism medication regimen  Medication management Pharmacist Clinical Goal(s): Over the next 90 days, patient will work with PharmD and providers to achieve optimal medication adherence Current pharmacy: [facility] Intervention:  Comprehensive medication review completed.  Coordinated packaging and refill for famotidine with pharmacy Reviewed with patient which medications are to be used as needed (these will not be included in packaging).   Discussed plan to get 1 more refill from [facility] with patient. Will transition to [place] is Mcarthur Rossetti is her insurance in 2023.  Interventions Comprehensive medication review performed. Utilize [facility] pharmacy for medication synchronization, packaging and delivery at least through 2022. Will change to [place] if Medicare plan is [employer] in 2023.  Reminded to secure all medications in your home from potential diversion or accidental ingestion.  Patient self care activities - Over the next 90 days, patient will: Focus on medication adherence by filling and taking medications appropriately  Take medications as prescribed Report any questions or concerns to PharmD and/or provider(s) Contact Cherre Robins, PharmD if any issues with medications   The patient verbalized understanding of instructions, educational materials, and care plan provided today and agreed to receive a mailed copy of patient instructions, educational materials, and care plan.

## 2021-04-20 NOTE — Patient Outreach (Signed)
[facility]) Care Management  04/20/2021  Veronica Wolf 1941/03/02 [id number]  Nurse Health Coach performed case closure due to patient will transfer to embedded CM within her PCP practice. Patient was notified that another RNCM would begin to outreach to her within the next several weeks.   Plan: Case Closure  Blanchie Serve RN, BSN Nurse Health Coach [facility] [phone] [email or web address]

## 2021-04-20 NOTE — Chronic Care Management (AMB) (Signed)
Chronic Care Management Pharmacy Note  04/20/2021 Name:  Veronica Wolf MRN:  [id number] DOB:  [birth year]  Summary: Plan today was to assist patient with medications and assist with getting from either [facility] or [facility]. However patient sounding very ill on the phone. She are reported she had been in bed for 2 days with cough, aches and congestion.  Recommended she take a home COVID test - patient endorsed she has one at home.  She will call office back with results.  Encouraged her to go to [place] ED but patient states she does not have anyone to bring her currently.  Appointment made for phone / video visit with PCP tomorrow at 7:20am 04/21/2021 Encouraged patient to use albuterol nebulizer for wheezing. Can use Mucinex for congestion. Increase intake of fluids. Acetaminophen for fever and aches.   Subjective: Veronica Wolf is an 81 y.o. year old female who is a primary patient of Debbrah Alar, NP.  The CCM team was consulted for assistance with disease management and care coordination needs.    Engaged with patient by telephone for follow up visit in response to provider referral for pharmacy case management and/or care coordination services.   Consent to Services:  The patient was given information about Chronic Care Management services, agreed to services, and gave verbal consent prior to initiation of services.  Please see initial visit note for detailed documentation.   Patient Care Team: Debbrah Alar, NP as PCP - General (Internal Medicine) Revankar, Reita Cliche, MD as PCP - Cardiology (Cardiology) Cherre Robins, [place] (Pharmacist) Luretha Rued, RN as [place] Management  Recent office visits: 02/16/2021 - Phone Call  to PCP office regarding rash under her breast and stomach. Debbrah Alar sent in prescription for nystatin powder.  02/16/2021 - Internal Med Inda Castle) F/U anxiety and stomach pain. Referred  to GI for abdominal pain. UDS ordered. BP noted to be low - no medication changes. Continue to minitor BP.  01/09/2021 - PCP Inda Castle, NP) Seen for epigatric / chest pain. Labs ordered - CBC, BMP and lipase. Also ordered CT abdomin. No med changes noted.  12/03/2020- PCP Inda Castle, NP) Seen for back pain. Prescribed Medrol dose pack and ordered Xay. Also checked urinalysis and recommended she complete course of ciprofloxacin given while she was in [place].  09/16/2020 - PCP Phone Call. Ultrasound showed fatty liver. Recommended follow up with GI.  09/08/2020 - PCP Inda Castle) Seen for abdominal pain and diarrhea. Hemoccult negative. Reordered Abdominal CT, LFTs and lipase, CBC. No medication changes  Recent consult visits: 09/17/2020 - Cardio (Dr Harriet Masson) CAD / intermittant chest pain. Started low dose Imdue 71m daily and ezetimibe 123mdaily. Ordered nucular stress test   Hospital visits: None in the last 6 months   Objective:  Lab Results  Component Value Date   CREATININE 1.40 (H) 01/09/2021   CREATININE 1.38 (H) 08/18/2020   CREATININE 1.23 (H) 06/11/2020    Lab Results  Component Value Date   HGBA1C 6.5 01/09/2021   Last diabetic Eye exam: No results found for: HMDIABEYEEXA  Last diabetic Foot exam: No results found for: HMDIABFOOTEX      Component Value Date/Time   CHOL 204 (H) 08/18/2020 1203   TRIG 182.0 (H) 08/18/2020 1203   HDL 42.30 08/18/2020 1203   CHOLHDL 5 08/18/2020 1203   VLDL 36.4 08/18/2020 1203   LDLCALC 125 (H) 08/18/2020 1203   LDLCALC 108 (H) 03/14/2020 1439    Hepatic  Function Latest Ref Rng & Units 01/09/2021 09/08/2020 08/18/2020  Total Protein 6.0 - 8.3 g/dL 6.7 6.3 6.6  Albumin 3.5 - 5.2 g/dL 3.8 3.6 3.8  AST 0 - 37 U/L _0 ALT 0 - 35 U/L _1 Alk Phosphatase 39 - 117 U/L 82 64 80  Total Bilirubin 0.2 - 1.2 mg/dL 0.3 0.4 0.2  Bilirubin, Direct 0.0 - 0.3 mg/dL - 0.1 -    Lab Results  Component Value Date/Time   TSH 3.57 08/18/2020  12:03 PM   TSH 1.77 03/14/2020 02:39 PM   FREET4 0.85 01/24/2018 03:04 PM    CBC Latest Ref Rng & Units 09/08/2020 06/11/2020 01/23/2018  WBC 4.0 - 10.5 K/uL 7.3 9.1 8.3  Hemoglobin 12.0 - 15.0 g/dL 11.6(L) 11.5(L) 11.9(L)  Hematocrit 36.0 - 46.0 % 36.1 38.1 36.6  Platelets 150.0 - 400.0 K/uL 219.0 219 190.0    No results found for: VD25OH  Clinical ASCVD: Yes  The ASCVD Risk score (Arnett DK, et al., 2019) failed to calculate for the following reasons:   The 2019 ASCVD risk score is only valid for ages 60 to 74   The patient has a prior MI or stroke diagnosis    Other:  DEXA 03/05/2014             AP LUMBAR SPINE not used for calculation due to significant degenerative changes.             Left FOREARM (1/3 RADIUS) T Score: -0.4             Left FEMUR neck T-Score: -0.7     Social History   Tobacco Use  Smoking Status Former   Types: Cigarettes   Quit date: 04/24/2000   Years since quitting: 21.0  Smokeless Tobacco Never   BP Readings from Last 3 Encounters:  02/16/21 (!) 102/56  01/09/21 (!) 143/93  12/03/20 (!) 110/55   Pulse Readings from Last 3 Encounters:  02/16/21 82  01/09/21 88  12/03/20 87   Wt Readings from Last 3 Encounters:  04/13/21 182 lb (82.6 kg)  02/16/21 182 lb (82.6 kg)  01/09/21 189 lb (85.7 kg)    Assessment: Review of patient past medical history, allergies, medications, health status, including review of consultants reports, laboratory and other test data, was performed as part of comprehensive evaluation and provision of chronic care management services.   SDOH:  (Social Determinants of Health) assessments and interventions performed:      CCM Care Plan  Allergies  Allergen Reactions   Ibuprofen Other (See Comments)    Pt has hx of peptic ulcer and diverticulitis.     Medications Reviewed Today     Reviewed by Marta Antu, LPN (Licensed Practical Nurse) on 04/13/21 at [phone]  Med List Status: <None>   Medication Order  Taking? Sig Documenting Provider Last Dose Status Informant  albuterol (PROVENTIL) (2.5 MG/3ML) 0.083% nebulizer solution [id number]  USE 1 VIAL IN NEBULIZER EVERY 6 HOURS AS NEEDED FOR WHEEZING AND FOR SHORTNESS OF [name], Lenna Sciara, NP  Active   allopurinol (ZYLOPRIM) 100 MG tablet [id number]  Take 2 tablets (200 mg total) by mouth daily. Debbrah Alar, NP  Active   aspirin 81 MG EC tablet [id number]  Take 1 tablet (81 mg total) by mouth daily. Swallow whole. Tobb, Kardie, DO  Active   atorvastatin (LIPITOR) 80 MG tablet [id number]  Take 1 tablet (80 mg total) by mouth daily. Debbrah Alar, NP  Active  colchicine 0.6 MG tablet [id number]  Take 0.6 mg by mouth daily as needed (gout). [provider]  Active   diazepam (VALIUM) 10 MG tablet [id number]  TAKE ONE TABLET BY MOUTH EVERY TWELVE HOURS AS NEEDED FOR ANXIETY Debbrah Alar, NP  Active   dicyclomine (BENTYL) 10 MG capsule [id number]  Take 1 capsule (10 mg total) by mouth in the morning, at noon, in the evening, and at bedtime. Debbrah Alar, NP  Active   escitalopram (LEXAPRO) 20 MG tablet [id number]  Take 1 tablet (20 mg total) by mouth daily. Debbrah Alar, NP  Active   ezetimibe (ZETIA) 10 MG tablet [id number]  Take 1 tablet (10 mg total) by mouth daily. Debbrah Alar, NP  Active   famotidine (PEPCID) 20 MG tablet [id number]  Take 1 tablet (20 mg total) by mouth 2 (two) times daily. In the morning and at bedtime Colon Branch, MD  Active   isosorbide mononitrate (IMDUR) 30 MG 24 hr tablet [id number]  Take 1 tablet (30 mg total) by mouth daily. Berniece Salines, DO  Active   levothyroxine (SYNTHROID) 25 MCG tablet [id number]  TAKE ONE TABLET BY MOUTH EVERY MORNING Debbrah Alar, NP  Active   lisinopril (ZESTRIL) 2.5 MG tablet [id number]  Take 1 tablet (2.5 mg total) by mouth daily. Debbrah Alar, NP  Active   meclizine (ANTIVERT) 25 MG tablet [id number]  TAKE ONE TABLET BY MOUTH twive daily AS  NEEDED FOR dizziness Debbrah Alar, NP  Active   metoprolol succinate (TOPROL-XL) 25 MG 24 hr tablet [id number]  Take 0.5 tablets (12.5 mg total) by mouth daily. Debbrah Alar, NP  Active   nitroGLYCERIN (NITROSTAT) 0.4 MG SL tablet [id number]  Place 1 tablet (0.4 mg total) under the tongue every 5 (five) minutes as needed for chest pain. Tobb, Kardie, DO  Active   nystatin (MYCOSTATIN/NYSTOP) powder [id number]  APPLY POWDER TOPICALLY TO AFFECTED AREA 2 TIMES DAILY Debbrah Alar, NP  Active   pantoprazole (PROTONIX) 40 MG tablet [id number]  Take 1 tablet (40 mg total) by mouth 2 (two) times daily. Debbrah Alar, NP  Active   potassium chloride SA (KLOR-CON M) 20 MEQ tablet [id number]  TAKE ONE TABLET BY MOUTH AT Guerry Bruin, Lenna Sciara, NP  Active   sucralfate (CARAFATE) 1 g tablet [id number]  TAKE ONE TABLET BY MOUTH FOUR TIMES DAILY. MAY cut pill in half AND DISSOLVE in water prior TO drinking Debbrah Alar, NP  Active             Patient Active Problem List   Diagnosis Date Noted   Left otitis media 02/16/2021   Back pain 12/03/2020   Acute cystitis without hematuria 12/03/2020   Grief reaction 12/03/2020   Fatty liver 12/01/2020   Other chest pain 09/17/2020   Coronary artery disease involving native coronary artery of native heart 12/75/1700   Metabolic syndrome 17/49/4496   Prediabetes 09/17/2020   Allergy 09/16/2020   Anxiety 09/16/2020   Hypertension 09/16/2020   Vertigo 09/16/2020   Dark stools 09/08/2020   Dysphagia    Esophageal stricture    Hiatal hernia    Epigastric pain    Hypothyroid 01/25/2018   Mild CAD 10/03/2017   Stress-induced cardiomyopathy 09/19/2017   COPD exacerbation ([place]) 09/14/2017   Hypokalemia 09/14/2017   Non-ST elevation (NSTEMI) myocardial infarction [facility]) - due to Demand Infarction 09/14/2017   CKD (chronic kidney disease) 04/04/2017   HLD (hyperlipidemia)    Allergic rhinitis    Gout 02/28/2017   Depression with  anxiety  02/28/2017   Epigastric abdominal pain 12/22/2016   COPD with chronic bronchitis ([place]) 12/22/2016   GERD (gastroesophageal reflux disease) 12/22/2016   DOE (dyspnea on exertion) 12/22/2016   Peptic ulcer 01/04/2003    Immunization History  Administered Date(s) Administered   Fluad Quad(high Dose 65+) 03/14/2020, 12/03/2020   Influenza, High Dose Seasonal PF 01/23/2018, 12/07/2018   Influenza-Unspecified 12/23/2018   PFIZER(Purple Top)SARS-COV-2 Vaccination 05/14/2019, 06/08/2019   Pneumococcal Conjugate-13 07/16/2015   Pneumococcal Polysaccharide-23 12/23/2016   Tdap 09/02/2009, 04/05/2017    Conditions to be addressed/monitored: CAD, HTN, HLD, COPD, Anxiety, Depression and gout, GERD ; hypothyroidism  Care Plan : General Pharmacy (Adult)  Updates made by Cherre Robins, RPH-CPP since 04/20/2021 12:00 AM     Problem: Medication management and Monitoring and Coordination of Care for Chronic Conditions   Priority: High  Onset Date: 09/02/2020     Long-Range Goal: Medication management   Start Date: 09/02/2020  Recent Progress: On track  Priority: High  Note:     Current Barriers:  Unable to achieve control of hyperlipidemia  Does not adhere to prescribed medication regimen Does not maintain contact with provider office  Pharmacist Clinical Goal(s):  Over the next 90 days, patient will achieve adherence to monitoring guidelines and medication adherence to achieve therapeutic efficacy achieve control of hyperlipidemia as evidenced by LDL < 70 adhere to prescribed medication regimen as evidenced by filling prescriptions on times contact provider office for questions/concerns as evidenced notation of same in electronic health record through collaboration with PharmD and provider.   Interventions: 1:1 collaboration with Debbrah Alar, NP regarding development and update of comprehensive plan of care as evidenced by provider attestation and  co-signature Inter-disciplinary care team collaboration (see longitudinal plan of care) Comprehensive medication review performed; medication list updated in electronic medical record  Hypertension: Controlled though blood pressure was a little low at last office visit BP goal is:  <130/80  Patient is not checking BP at home - states she does not have BP cuff.  Patient has failed these meds in the past: None noted  Current Regimen: Metoprolol Succinate 48m 0.5 tab daily Lisinopril 2.573mdaily Interventions:  Reviewed signs and symptoms of low blood pressure to monitor for.  Discussed blood pressure  goal Continue current medications for blood pressure    Hyperlipidemia / history of MI: Not controlled; LDL goal < 70  Patient has failed these meds in past: None noted   In September patient reported she just stopped taking atorvastatin because she was taking so many other medications Current therapy Atorvastatin 8066maily (increased 08/18/2020) Ezetimibe 67m43mily (added 09/17/2020)  Metoprolol Succinate 25mg68m tab daily Aspirin 81mg 33my  Nitroglycerin 0.4mg - 9mce 1 tablet under tongue as needed for chest pain, may repeat in 5 minutes if chest pain not relieved. Interventions:  Discussed LDL goal Discussed importance of taking atorvastatin daily to both lower cholesterol and prevent ASCVD / recurrent MI; Adherence has improved since she started adherence packaging. Consider rechecking lipids with next labs since has not been rechecked since atorvastatin increased and ezetimibe added Reminded patient to reschedule appointment with cardiologist.   Chronic Obstructive Pulmonary Disease: Controlled Current treatment: Albuterol solution for nebulizer - use in nebulizer up to every 6 hours as needed for shortness of breath No exacerbations requiring treatment in the last 6 months  Had phone appointment with Health Coach for COPD on 03/06/2021 Interventions:  None  Depression/Anxiety:  Improving; goal: decrease use of diazepam and decrease number of days per  month with symptoms of grief / depression Current treatment: Escitalopram 52m daily Diazepam 177mevery 12 hours as needed  Patient has failed these meds in past: citalopram (ineffective) She still reports some days that she is sad when she thinks about her brother's passing. Patient has declined referral in past for grief counseling.  Patient was using diazepam frequently but today she reports that she takes usually only 1 tablet at bedtime but sometimes takes a second dose if needed.  Her refill history shows that she last filled diazepam for #10 tablets on 03/03/2021 Interventions:  Discussed adherence and importance of taking escitalopram for depression / anxiety Encouraged patient to continue to use diazepam sparingly - only at night if able.  Consider grief counseling (patient continues to decline)  Hypothyroidism:  Patient is currently controlled on the following medications:  levothyroxine 2578mdaily  TSH was WNL at last check on 08/18/2020 Patient has failed these meds in past: None noted  Intervention: (addressed at previous visit)  Continue current therapy   Gout:   Goal Uric Acid < 6 mg/dL   Last uric acid was not at goal (UA = 8.4 on 03/14/2020) but patient denies symptoms of gout at this time.  Current medications:  Allopurinol 100m16mtake 2 tablets = 200mg87mly Colchicine 0.6mg u70mo bid as needed for acute gout Medications that may increase uric acid levels: Aspirin Last gout flare: Feb 2022 Patient has failed these meds in past: None noted   Interventions: (addressed at previous visit) Reviewed preventative versus treatment medications for gout.  Continue current therapy Consider checking Uric acid with next labs.    Medication management:   She started medication adherence packaging 01/08/2021 and really likes it. Has helped her with remembering to take her  medication on time.  Noted that [facility] [place] [facility] need another refll of maintenance medicaitons 04/01/2021.  Consulted with patient and she is unsure if her son in law changed her Medicare plan [facility] with [employer]. She will let our office know.  [facility] [place] that [place] [facility] in until April 05, 2021 to see if patient will have [facility] will fill her maintenance medications at least once more for 30 days and supply in packaging.  Current pharmacy:  [facility] Intervention:  Comprehensive medication review completed.  Coordinated packaging. Discussed plan to get 1 more refill from [facility]. [facility] in 2023.  Reviewed with patient which medications are to be used as needed (these will not be included in packaging).   Plan was to assist patient with medications and assist with getting from either [facility] or [facility]. However patient sounding very ill on the phone. She are reported she had been in bed for 2 days with cough, aches and congestion.  Recommended she take a home COVID test - patient endorsed she has one at home.  She will call office back with results.  [facility] [place] [facility] patient states she does not have anyone to bring her currently.  Appointment made for phone / video visit with PCP tomorrow at 7:20am 04/21/2021 Encouraged patient to use albuterol nebulizer for wheezing. Can use Mucinex for congestion. Increase intake of fluids. Acetaminophen for fever and aches.    Patient Goals/Self-Care Activities Over the  next 90 days, patient will:  take medications as prescribed, focus on medication adherence by filing prescriptions at one pharmacy, and call if any issues with medications   Follow  Up Plan:  will check back in with patient in 3 to 7 days to assist with medication refills and transition to [place]         Medication Assistance: None required.  Patient affirms current coverage meets needs.  Patient's preferred pharmacy is:  [place], [place] - [address] [place] [address] Phone: [phone] Fax: [phone]  [place], [place] - [address] [place] [address] Phone: [phone] Fax: [phone]  [facility] - [place], [place] [address] Phone: [phone] Fax: [phone]   Follow Up:  Patient agrees to Care Plan and Follow-up.  Plan: follow up in 3 to 7 days  Cherre Robins, PharmD Clinical Pharmacist [facility] [place] [phone]

## 2021-04-20 NOTE — Chronic Care Management (AMB) (Signed)
°  Chronic Care Management   Note  04/20/2021 Name: SHUKRI NISTLER MRN: [id number] DOB: Nov 26, 1940  NYASHIA RANEY is a 81 y.o. year old female who is a primary care patient of [name], NP. LAYA LETENDRE is currently enrolled in care management services. An additional referral for RNCM was placed.   Follow up plan: Telephone appointment with care management team member scheduled for: 05/04/2021  Burman Nieves, CCMA Care Guide, Embedded Care Coordination [facility]   Care Management  Direct Dial: [phone]

## 2021-04-21 ENCOUNTER — Encounter (HOSPITAL_BASED_OUTPATIENT_CLINIC_OR_DEPARTMENT_OTHER): Payer: Self-pay

## 2021-04-21 ENCOUNTER — Ambulatory Visit (HOSPITAL_BASED_OUTPATIENT_CLINIC_OR_DEPARTMENT_OTHER): Admission: RE | Admit: 2021-04-21 | Payer: Medicare HMO | Source: Ambulatory Visit

## 2021-04-21 ENCOUNTER — Other Ambulatory Visit: Payer: Self-pay

## 2021-04-21 ENCOUNTER — Ambulatory Visit (HOSPITAL_BASED_OUTPATIENT_CLINIC_OR_DEPARTMENT_OTHER)
Admission: RE | Admit: 2021-04-21 | Discharge: 2021-04-21 | Disposition: A | Discharge status: Home / Self Care | Payer: Medicare HMO | Source: Ambulatory Visit | Attending: Family | Admitting: Family

## 2021-04-21 ENCOUNTER — Emergency Department (HOSPITAL_BASED_OUTPATIENT_CLINIC_OR_DEPARTMENT_OTHER): Payer: Medicare HMO

## 2021-04-21 ENCOUNTER — Telehealth: Payer: Self-pay | Admitting: Family

## 2021-04-21 ENCOUNTER — Telehealth (INDEPENDENT_AMBULATORY_CARE_PROVIDER_SITE_OTHER): Payer: Medicare HMO | Admitting: Family

## 2021-04-21 ENCOUNTER — Emergency Department (HOSPITAL_BASED_OUTPATIENT_CLINIC_OR_DEPARTMENT_OTHER)
Admission: EM | Admit: 2021-04-21 | Discharge: 2021-04-21 | Disposition: A | Discharge status: Home / Self Care | Payer: Medicare HMO | Attending: Emergency Medicine | Admitting: Emergency Medicine

## 2021-04-21 DIAGNOSIS — B9789 Other viral agents as the cause of diseases classified elsewhere: Secondary | ICD-10-CM | POA: Diagnosis not present

## 2021-04-21 DIAGNOSIS — R0789 Other chest pain: Secondary | ICD-10-CM | POA: Diagnosis not present

## 2021-04-21 DIAGNOSIS — A419 Sepsis, unspecified organism: Secondary | ICD-10-CM | POA: Diagnosis not present

## 2021-04-21 DIAGNOSIS — Z20822 Contact with and (suspected) exposure to covid-19: Secondary | ICD-10-CM | POA: Diagnosis not present

## 2021-04-21 DIAGNOSIS — I129 Hypertensive chronic kidney disease with stage 1 through stage 4 chronic kidney disease, or unspecified chronic kidney disease: Secondary | ICD-10-CM | POA: Insufficient documentation

## 2021-04-21 DIAGNOSIS — J069 Acute upper respiratory infection, unspecified: Secondary | ICD-10-CM | POA: Diagnosis not present

## 2021-04-21 DIAGNOSIS — K5730 Diverticulosis of large intestine without perforation or abscess without bleeding: Secondary | ICD-10-CM | POA: Diagnosis not present

## 2021-04-21 DIAGNOSIS — R0602 Shortness of breath: Secondary | ICD-10-CM | POA: Diagnosis present

## 2021-04-21 DIAGNOSIS — R109 Unspecified abdominal pain: Secondary | ICD-10-CM | POA: Insufficient documentation

## 2021-04-21 DIAGNOSIS — Z7982 Long term (current) use of aspirin: Secondary | ICD-10-CM | POA: Insufficient documentation

## 2021-04-21 DIAGNOSIS — R059 Cough, unspecified: Secondary | ICD-10-CM | POA: Diagnosis not present

## 2021-04-21 DIAGNOSIS — I700 Atherosclerosis of aorta: Secondary | ICD-10-CM | POA: Diagnosis not present

## 2021-04-21 DIAGNOSIS — J9811 Atelectasis: Secondary | ICD-10-CM | POA: Diagnosis not present

## 2021-04-21 DIAGNOSIS — N189 Chronic kidney disease, unspecified: Secondary | ICD-10-CM | POA: Diagnosis not present

## 2021-04-21 DIAGNOSIS — R079 Chest pain, unspecified: Secondary | ICD-10-CM | POA: Diagnosis not present

## 2021-04-21 DIAGNOSIS — J441 Chronic obstructive pulmonary disease with (acute) exacerbation: Secondary | ICD-10-CM | POA: Insufficient documentation

## 2021-04-21 DIAGNOSIS — Z79899 Other long term (current) drug therapy: Secondary | ICD-10-CM | POA: Diagnosis not present

## 2021-04-21 DIAGNOSIS — Z1231 Encounter for screening mammogram for malignant neoplasm of breast: Secondary | ICD-10-CM | POA: Insufficient documentation

## 2021-04-21 LAB — URINALYSIS, ROUTINE W REFLEX MICROSCOPIC
Bilirubin Urine: NEGATIVE
Glucose, UA: NEGATIVE mg/dL
Hgb urine dipstick: NEGATIVE
Ketones, ur: NEGATIVE mg/dL
Leukocytes,Ua: NEGATIVE
Nitrite: NEGATIVE
Protein, ur: NEGATIVE mg/dL
Specific Gravity, Urine: 1.025 (ref 1.005–1.030)
pH: 5.5 (ref 5.0–8.0)

## 2021-04-21 LAB — TROPONIN I (HIGH SENSITIVITY)
Troponin I (High Sensitivity): 8 ng/L (ref <18)
Troponin I (High Sensitivity): 7 ng/L (ref <18)

## 2021-04-21 LAB — HEPATIC FUNCTION PANEL
ALT: 10 U/L (ref 0–44)
AST: 14 U/L — ABNORMAL LOW (ref 15–41)
Albumin: 3.1 g/dL — ABNORMAL LOW (ref 3.5–5.0)
Alkaline Phosphatase: 70 U/L (ref 38–126)
Bilirubin, Direct: 0.1 mg/dL (ref 0.0–0.2)
Indirect Bilirubin: 0.4 mg/dL (ref 0.3–0.9)
Total Bilirubin: 0.5 mg/dL (ref 0.3–1.2)
Total Protein: 6.6 g/dL (ref 6.5–8.1)

## 2021-04-21 LAB — LIPASE, BLOOD: Lipase: 27 U/L (ref 11–51)

## 2021-04-21 LAB — APTT: aPTT: 30 seconds (ref 24–36)

## 2021-04-21 LAB — CBC
HCT: 36.1 % (ref 36.0–46.0)
Hemoglobin: 11.3 g/dL — ABNORMAL LOW (ref 12.0–15.0)
MCH: 27.3 pg (ref 26.0–34.0)
MCHC: 31.3 g/dL (ref 30.0–36.0)
MCV: 87.2 fL (ref 80.0–100.0)
Platelets: 210 10*3/uL (ref 150–400)
RBC: 4.14 MIL/uL (ref 3.87–5.11)
RDW: 15.6 % — ABNORMAL HIGH (ref 11.5–15.5)
WBC: 16.2 10*3/uL — ABNORMAL HIGH (ref 4.0–10.5)
nRBC: 0 % (ref 0.0–0.2)

## 2021-04-21 LAB — PROTIME-INR
INR: 1.3 — ABNORMAL HIGH (ref 0.8–1.2)
Prothrombin Time: 15.8 seconds — ABNORMAL HIGH (ref 11.4–15.2)

## 2021-04-21 LAB — RESP PANEL BY RT-PCR (FLU A&B, COVID) ARPGX2
Influenza A by PCR: NEGATIVE
Influenza B by PCR: NEGATIVE
SARS Coronavirus 2 by RT PCR: NEGATIVE

## 2021-04-21 LAB — BASIC METABOLIC PANEL
Anion gap: 9 (ref 5–15)
BUN: 14 mg/dL (ref 8–23)
CO2: 21 mmol/L — ABNORMAL LOW (ref 22–32)
Calcium: 8.2 mg/dL — ABNORMAL LOW (ref 8.9–10.3)
Chloride: 106 mmol/L (ref 98–111)
Creatinine, Ser: 1.63 mg/dL — ABNORMAL HIGH (ref 0.44–1.00)
GFR, Estimated: 31 mL/min — ABNORMAL LOW (ref >60)
Glucose, Bld: 96 mg/dL (ref 70–99)
Potassium: 4 mmol/L (ref 3.5–5.1)
Sodium: 136 mmol/L (ref 135–145)

## 2021-04-21 LAB — LACTIC ACID, PLASMA: Lactic Acid, Venous: 1.1 mmol/L (ref 0.5–1.9)

## 2021-04-21 MED ORDER — VANCOMYCIN HCL IN DEXTROSE 1-5 GM/200ML-% IV SOLN: 1000.0000 mg | Freq: Once | INTRAVENOUS | Status: DC

## 2021-04-21 MED ORDER — AMOXICILLIN-POT CLAVULANATE 875-125 MG PO TABS: 1.0000 | ORAL_TABLET | Freq: Two times a day (BID) | ORAL | 0 refills | Status: AC

## 2021-04-21 MED ORDER — IPRATROPIUM-ALBUTEROL 0.5-2.5 (3) MG/3ML IN SOLN
3.0000 mL | Freq: Once | RESPIRATORY_TRACT | Status: AC
  Administered 2021-04-21: 3 mL via RESPIRATORY_TRACT
  Filled 2021-04-21: qty 3

## 2021-04-21 MED ORDER — SODIUM CHLORIDE 0.9 % IV SOLN
2.0000 g | Freq: Once | INTRAVENOUS | Status: AC
  Administered 2021-04-21: 2 g via INTRAVENOUS
  Filled 2021-04-21: qty 2

## 2021-04-21 MED ORDER — DOXYCYCLINE HYCLATE 100 MG PO CAPS: 100.0000 mg | ORAL_CAPSULE | Freq: Two times a day (BID) | ORAL | 0 refills | Status: AC

## 2021-04-21 MED ORDER — VANCOMYCIN HCL IN DEXTROSE 1-5 GM/200ML-% IV SOLN
1000.0000 mg | Freq: Once | INTRAVENOUS | Status: AC
  Administered 2021-04-21: 1000 mg via INTRAVENOUS
  Filled 2021-04-21: qty 200

## 2021-04-21 MED ORDER — BENZONATATE 100 MG PO CAPS: 100.0000 mg | ORAL_CAPSULE | Freq: Three times a day (TID) | ORAL | 0 refills | Status: DC

## 2021-04-21 MED ORDER — LACTATED RINGERS IV BOLUS (SEPSIS)
1000.0000 mL | Freq: Once | INTRAVENOUS | Status: AC
  Administered 2021-04-21: 1000 mL via INTRAVENOUS

## 2021-04-21 MED ORDER — LACTATED RINGERS IV SOLN: INTRAVENOUS | Status: DC

## 2021-04-21 MED ORDER — METRONIDAZOLE 500 MG/100ML IV SOLN
500.0000 mg | Freq: Once | INTRAVENOUS | Status: AC
  Administered 2021-04-21: 500 mg via INTRAVENOUS
  Filled 2021-04-21: qty 100

## 2021-04-21 MED ORDER — VANCOMYCIN HCL IN DEXTROSE 1-5 GM/200ML-% IV SOLN: 1000.0000 mg | INTRAVENOUS | Status: DC

## 2021-04-21 MED ORDER — VANCOMYCIN HCL 500 MG/100ML IV SOLN
500.0000 mg | Freq: Once | INTRAVENOUS | Status: DC
  Filled 2021-04-21: qty 100

## 2021-04-21 MED ORDER — LACTATED RINGERS IV BOLUS (SEPSIS)
500.0000 mL | Freq: Once | INTRAVENOUS | Status: AC
  Administered 2021-04-21: 500 mL via INTRAVENOUS

## 2021-04-21 MED ORDER — VANCOMYCIN HCL 1750 MG/350ML IV SOLN
1750.0000 mg | Freq: Once | INTRAVENOUS | Status: DC
Start: 2021-04-21 — End: 2021-04-21

## 2021-04-21 MED ORDER — LACTATED RINGERS IV BOLUS
1000.0000 mL | Freq: Once | INTRAVENOUS | Status: AC
Start: 2021-04-21 — End: 2021-04-21
  Administered 2021-04-21: 1000 mL via INTRAVENOUS

## 2021-04-21 MED ORDER — SODIUM CHLORIDE 0.9 % IV SOLN: 2.0000 g | INTRAVENOUS | Status: DC

## 2021-04-21 MED ORDER — METHYLPREDNISOLONE SODIUM SUCC 125 MG IJ SOLR
125.0000 mg | Freq: Once | INTRAMUSCULAR | Status: AC
  Administered 2021-04-21: 125 mg via INTRAVENOUS
  Filled 2021-04-21: qty 2

## 2021-04-21 MED ORDER — IOHEXOL 300 MG/ML  SOLN
100.0000 mL | Freq: Once | INTRAMUSCULAR | Status: AC | PRN
  Administered 2021-04-21: 100 mL via INTRAVENOUS

## 2021-04-21 MED ORDER — PREDNISONE 20 MG PO TABS
40.0000 mg | ORAL_TABLET | Freq: Every day | ORAL | 0 refills | Status: AC
Start: 2021-04-22 — End: 2021-04-27

## 2021-04-21 NOTE — Progress Notes (Signed)
Virtual telephone visit    Virtual Visit via Telephone Note   This visit type was conducted due to national recommendations for restrictions regarding the COVID-19 Pandemic (e.g. social distancing) in an effort to limit this patient's exposure and mitigate transmission in our community. Due to her co-morbid illnesses, this patient is at least at moderate risk for complications without adequate follow up. This format is felt to be most appropriate for this patient at this time. The patient did not have access to video technology or had technical difficulties with video requiring transitioning to audio format only (telephone). Physical exam was limited to content and character of the telephone converstion. CMA was able to get the patient set up on a telephone visit.   Patient location: Home. Patient and provider in visit Provider location: Office  I discussed the limitations of evaluation and management by telemedicine and the availability of in person appointments. The patient expressed understanding and agreed to proceed.   Visit Date: 04/21/2021  Today's healthcare provider: Lemont Fillers, NP     Subjective:    Patient ID: Veronica Wolf, female    DOB: Dec 07, 1940, 81 y.o.   MRN: [id number]  Chief Complaint  Patient presents with   Cough    Complains of persistent cough with chest congestion    Cough   Patient presents today for telephone visit.  Reports "bad cold" and cough.  Reports that cough has been going on for 1 month and is getting worse.  Reports pain in the left chest and left shoulder for a few days.  Left sided chest pain/shoulder pain "comes and goes." She does have history of MI.   She denies fever.  Tried to test herself for covid but "I couldn't get it together."    Past Medical History:  Diagnosis Date   Allergic rhinitis    Allergy    seasonal allergies   Anxiety    CKD (chronic kidney disease) 04/04/2017   COPD (chronic obstructive pulmonary  disease) (HCC)    uses inhaler   Depression    Fatty liver    GERD (gastroesophageal reflux disease)    Gout    hx of   Hyperlipidemia    on meds   Hypertension    on meds   Myocardial infarction [facility])    Peptic ulcer 01/04/2003   Vertigo     Past Surgical History:  Procedure Laterality Date   ABDOMINAL HYSTERECTOMY     APPENDECTOMY     BIOPSY  08/13/2020   Procedure: BIOPSY;  Surgeon: Shellia Cleverly, DO;  Location: [facility];  Service: Gastroenterology;;   ESOPHAGOGASTRODUODENOSCOPY (EGD) WITH PROPOFOL N/A 08/13/2020   Procedure: ESOPHAGOGASTRODUODENOSCOPY (EGD) WITH PROPOFOL;  Surgeon: Shellia Cleverly, DO;  Location: [facility];  Service: Gastroenterology;  Laterality: N/A;   LEFT HEART CATH AND CORONARY ANGIOGRAPHY N/A 06/13/2017   Procedure: LEFT HEART CATH AND CORONARY ANGIOGRAPHY;  Surgeon: Marykay Lex, MD;  Location: [facility] INVASIVE CV LAB;  Service: Cardiovascular;  Laterality: N/A;   RIGHT/LEFT HEART CATH AND CORONARY ANGIOGRAPHY N/A 09/16/2017   Procedure: RIGHT/LEFT HEART CATH AND CORONARY ANGIOGRAPHY;  Surgeon: [place]az[place], Peter M, MD;  Location: [facility] INVASIVE CV LAB;  Service: Cardiovascular;  Laterality: N/A;   SAVORY DILATION N/A 08/13/2020   Procedure: SAVORY DILATION;  Surgeon: Shellia Cleverly, DO;  Location: [facility];  Service: Gastroenterology;  Laterality: N/A;   ULTRASOUND GUIDANCE FOR VASCULAR ACCESS  09/16/2017   Procedure: Ultrasound Guidance For Vascular Access;  Surgeon:  Martin[place], Peter M, MD;  Location: [place] CV LAB;  Service: Cardiovascular;;   WISDOM TOOTH EXTRACTION      Family History  Problem Relation Age of Onset   Breast cancer Mother    Stroke Father    Stomach cancer Neg Hx    Colon cancer Neg Hx    Pancreatic cancer Neg Hx    Esophageal cancer Neg Hx    Colon polyps Neg Hx    Rectal cancer Neg Hx     Social History   Socioeconomic History   Marital status: Widowed    Spouse name: Not on file   Number of children: 1    Years of education: Not on file   Highest education level: Not on file  Occupational History   Occupation: Retired  Tobacco Use   Smoking status: Former    Types: Cigarettes    Quit date: 04/24/2000    Years since quitting: 21.0   Smokeless tobacco: Never  Vaping Use   Vaping Use: Never used  Substance and Sexual Activity   Alcohol use: Not Currently    Comment: has previous hx of ETOH abuse, quit 2014   Drug use: No   Sexual activity: Never  Other Topics Concern   Not on file  Social History Narrative   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[place]   Grew up in [place]   Has daughter locally   Social Determinants of Radio broadcast assistant Strain: Low Risk    Difficulty of Paying Living Expenses: Not very hard  Food Insecurity: No Food Insecurity   Worried About Charity fundraiser in the Last Year: Never true   Arboriculturist in the Last Year: Never true  Transportation Needs: Public librarian (Medical): Yes   Lack of Transportation (Non-Medical): Yes  Physical Activity: Inactive   Days of Exercise per Week: 0 days   Minutes of Exercise per Session: 0 min  Stress: Not on file  Social Connections: Not on file  Intimate Partner Violence: Not on file    Outpatient Medications Prior to Visit  Medication Sig Dispense Refill   albuterol (PROVENTIL) (2.5 MG/3ML) 0.083% nebulizer solution USE 1 VIAL IN NEBULIZER EVERY 6 HOURS AS NEEDED FOR WHEEZING AND FOR SHORTNESS OF BREATH 150 mL 0   allopurinol (ZYLOPRIM) 100 MG tablet Take 2 tablets (200 mg total) by mouth daily. 180 tablet 1   aspirin 81 MG EC tablet Take 1 tablet (81 mg total) by mouth daily. Swallow whole. 30 tablet 12   atorvastatin (LIPITOR) 80 MG tablet Take 1 tablet (80 mg total) by mouth daily. 90 tablet 3   colchicine 0.6 MG tablet Take 0.6 mg by mouth daily as needed (gout).     diazepam (VALIUM) 10 MG tablet TAKE ONE TABLET BY MOUTH EVERY TWELVE HOURS AS NEEDED FOR  ANXIETY 45 tablet 0   dicyclomine (BENTYL) 10 MG capsule Take 1 capsule (10 mg total) by mouth in the morning, at noon, in the evening, and at bedtime. 360 capsule 1   escitalopram (LEXAPRO) 20 MG tablet Take 1 tablet (20 mg total) by mouth daily. 90 tablet 1   ezetimibe (ZETIA) 10 MG tablet Take 1 tablet (10 mg total) by mouth daily. 90 tablet 3   famotidine (PEPCID) 20 MG tablet Take 1 tablet (20 mg total) by mouth 2 (two) times daily. In the morning and at bedtime 60 tablet 2   isosorbide mononitrate (IMDUR)  30 MG 24 hr tablet Take 1 tablet (30 mg total) by mouth daily. 90 tablet 3   levothyroxine (SYNTHROID) 25 MCG tablet TAKE ONE TABLET BY MOUTH EVERY MORNING 90 tablet 1   lisinopril (ZESTRIL) 2.5 MG tablet Take 1 tablet (2.5 mg total) by mouth daily. 90 tablet 3   meclizine (ANTIVERT) 25 MG tablet TAKE ONE TABLET BY MOUTH twive daily AS NEEDED FOR dizziness 30 tablet 1   metoprolol succinate (TOPROL-XL) 25 MG 24 hr tablet Take 0.5 tablets (12.5 mg total) by mouth daily. 45 tablet 3   nitroGLYCERIN (NITROSTAT) 0.4 MG SL tablet Place 1 tablet (0.4 mg total) under the tongue every 5 (five) minutes as needed for chest pain. 45 tablet 3   nystatin (MYCOSTATIN/NYSTOP) powder APPLY POWDER TOPICALLY TO AFFECTED AREA 2 TIMES DAILY 60 g 4   pantoprazole (PROTONIX) 40 MG tablet Take 1 tablet (40 mg total) by mouth 2 (two) times daily. 180 tablet 0   potassium chloride SA (KLOR-CON M) 20 MEQ tablet TAKE ONE TABLET BY MOUTH AT NOON 90 tablet 1   sucralfate (CARAFATE) 1 g tablet TAKE ONE TABLET BY MOUTH FOUR TIMES DAILY. MAY cut pill in half AND DISSOLVE in water prior TO drinking 120 tablet 0   No facility-administered medications prior to visit.    Allergies  Allergen Reactions   Ibuprofen Other (See Comments)    Pt has hx of peptic ulcer and diverticulitis.     Review of Systems  Respiratory:  Positive for cough.       Objective:    Physical Exam Pulmonary:     Effort: No respiratory  distress.     Breath sounds: Rhonchi (noted with frequent episodes of coughing during visit) present.  Neurological:     Mental Status: She is alert. Mental status is at baseline.  Psychiatric:        Mood and Affect: Mood normal.    There were no vitals taken for this visit. Wt Readings from Last 3 Encounters:  04/13/21 182 lb (82.6 kg)  02/16/21 182 lb (82.6 kg)  01/09/21 189 lb (85.7 kg)        Assessment & Plan:   Problem List Items Addressed This Visit       Unprioritized   Intermittent left-sided chest pain    New. Radiates into left shoulder.Given her hx of CAD, I am concerned about this new pain.   Pt is advised to go to the ER now for further evaluation. She will have a family member bring her to one of the [facility]'s.        Cough - Primary    Suspect COPD exacerbation, possibly with pneumonia.  Needs chest imaging/exam.        I am having Sonji P. Maute "Mardene Celeste" maintain her colchicine, aspirin, nitroGLYCERIN, famotidine, allopurinol, albuterol, atorvastatin, dicyclomine, escitalopram, ezetimibe, levothyroxine, lisinopril, metoprolol succinate, pantoprazole, sucralfate, isosorbide mononitrate, diazepam, potassium chloride SA, nystatin, and meclizine.  No orders of the defined types were placed in this encounter.    I discussed the assessment and treatment plan with the patient. The patient was provided an opportunity to ask questions and all were answered. The patient agreed with the plan and demonstrated an understanding of the instructions.   The patient was advised to call back or seek an in-person evaluation if the symptoms worsen or if the condition fails to improve as anticipated.  I provided 12 minutes of non-face-to-face time during this encounter.   Nance Pear, NP [place]  Clinical research associate at [facility] (phone) [phone] (fax)  [place]

## 2021-04-21 NOTE — Telephone Encounter (Signed)
She sounded terrible at today's visit and was reporting intermittent left shoulder/left chest pain. I still think she should be seen in the ED.  If she doesn't want to go to the ED then I would recommend Urgent care.

## 2021-04-21 NOTE — Discharge Instructions (Addendum)
You were seen today for worsening cough and intermittent chest pain.  Your x-ray was negative for pneumonia or signs of infection.  We are a little concerned when you are blood pressure started to drop a little bit, but this has improved greatly throughout the day.  You did receive a couple of antibiotics IV today, but I am sending you home with a course of antibiotics to start tomorrow to prevent progression into pneumonia.  I have also given you a dose of steroids today.  When you fill your prednisone prescription at home, you may start them tomorrow.   Continue to use your inhaler to help with your cough and symptoms.  I have also sent you in some Tessalon which you can take to help suppress your cough.  I recommend that you go to the drugstore or pharmacy and pick up an SPO2 monitor.  You can place this on your finger and if the number remains under 90%, you need to return to the ED.  Additionally if you become short of breath or have difficulty breathing, you need to return to the ED.  Please follow-up with your primary care doctor in a few days to make sure that you are heading in the right direction.  Hope you feel better soon!

## 2021-04-21 NOTE — Sepsis Progress Note (Signed)
Confirmed with bedside RN Vernona Rieger that blood cultures were drawn prior to antibiotic initiation.

## 2021-04-21 NOTE — ED Triage Notes (Addendum)
Pt c/o flu like s xx 1 week-DOE-pt's initial O2 sat mid 80s-placed on O2 2LNC-sats up to mid 90s

## 2021-04-21 NOTE — Assessment & Plan Note (Signed)
Suspect COPD exacerbation, possibly with pneumonia.  Needs chest imaging/exam.

## 2021-04-21 NOTE — Telephone Encounter (Signed)
Patient called back and spoke to [facility].  Veronica Wolf advised her that our recommendation was still for her to go to the ER.

## 2021-04-21 NOTE — Telephone Encounter (Signed)
Patient went to ed

## 2021-04-21 NOTE — Assessment & Plan Note (Signed)
New. Radiates into left shoulder.Given her hx of CAD, I am concerned about this new pain.   Pt is advised to go to the ER now for further evaluation. She will have a family member bring her to one of the [place].

## 2021-04-21 NOTE — Sepsis Progress Note (Signed)
eLink is following this Code Sepsis. °

## 2021-04-21 NOTE — ED Provider Notes (Signed)
[facility] EMERGENCY DEPARTMENT Provider Note   CSN: [id number] Arrival date & time: 04/21/21  1501     History  Chief Complaint  Patient presents with   Cough    Veronica Wolf is a 81 y.o. female with history of MI, COPD, CKD, GERD, hypertension, gout, peptic ulcer disease who presents to the ED for evaluation of cough and flulike symptoms that been ongoing for 4 days.  Patient states she has been coughing aggressively since she was walking in the rain 4 days ago.  Cough is overall nonproductive.  She also complains of chest pain on the right side of her ribs and over the lateral left side of her ribs.  She has been taking albuterol at home without any improvement.  No recent sick contacts, no recent travel.  She did not take any home test.  She denies fevers, runny nose, abdominal pain, N/V/D, urinary symptoms.  Per triage report, patient's initial O2 sat was mid 80s that improved to mid 90s once on 2 L nasal cannula.   Cough Associated symptoms: chest pain and shortness of breath   Associated symptoms: no fever, no headaches and no rash       Home Medications Prior to Admission medications   Medication Sig Start Date End Date Taking? Authorizing Provider  albuterol (PROVENTIL) (2.5 MG/3ML) 0.083% nebulizer solution USE 1 VIAL IN NEBULIZER EVERY 6 HOURS AS NEEDED FOR WHEEZING AND FOR SHORTNESS OF BREATH 03/20/21   [name], NP  allopurinol (ZYLOPRIM) 100 MG tablet Take 2 tablets (200 mg total) by mouth daily. 03/20/21   [name], NP  aspirin 81 MG EC tablet Take 1 tablet (81 mg total) by mouth daily. Swallow whole. 10/28/20   Tobb, Kardie, DO  atorvastatin (LIPITOR) 80 MG tablet Take 1 tablet (80 mg total) by mouth daily. 03/20/21   [name], NP  colchicine 0.6 MG tablet Take 0.6 mg by mouth daily as needed (gout).    [provider]  diazepam (VALIUM) 10 MG tablet TAKE ONE TABLET BY MOUTH EVERY TWELVE HOURS AS NEEDED FOR ANXIETY  04/07/21   [name], NP  dicyclomine (BENTYL) 10 MG capsule Take 1 capsule (10 mg total) by mouth in the morning, at noon, in the evening, and at bedtime. 03/20/21   [name], NP  escitalopram (LEXAPRO) 20 MG tablet Take 1 tablet (20 mg total) by mouth daily. 03/20/21   [name], NP  ezetimibe (ZETIA) 10 MG tablet Take 1 tablet (10 mg total) by mouth daily. 03/20/21 06/18/21  [name], NP  famotidine (PEPCID) 20 MG tablet Take 1 tablet (20 mg total) by mouth 2 (two) times daily. In the morning and at bedtime 03/03/21   Wanda Plump, MD  isosorbide mononitrate (IMDUR) 30 MG 24 hr tablet Take 1 tablet (30 mg total) by mouth daily. 04/02/21   Tobb, Kardie, DO  levothyroxine (SYNTHROID) 25 MCG tablet TAKE ONE TABLET BY MOUTH EVERY MORNING 03/20/21   [name], NP  lisinopril (ZESTRIL) 2.5 MG tablet Take 1 tablet (2.5 mg total) by mouth daily. 03/20/21   [name], NP  meclizine (ANTIVERT) 25 MG tablet TAKE ONE TABLET BY MOUTH twive daily AS NEEDED FOR dizziness 04/08/21   [name], NP  metoprolol succinate (TOPROL-XL) 25 MG 24 hr tablet Take 0.5 tablets (12.5 mg total) by mouth daily. 03/20/21   [name], NP  nitroGLYCERIN (NITROSTAT) 0.4 MG SL tablet Place 1 tablet (0.4 mg total) under the tongue every 5 (five)  minutes as needed for chest pain. 10/28/20   Tobb, Kardie, DO  nystatin (MYCOSTATIN/NYSTOP) powder APPLY POWDER TOPICALLY TO AFFECTED AREA 2 TIMES DAILY 04/06/21   [name], NP  pantoprazole (PROTONIX) 40 MG tablet Take 1 tablet (40 mg total) by mouth 2 (two) times daily. 03/20/21   [name], NP  potassium chloride SA (KLOR-CON M) 20 MEQ tablet TAKE ONE TABLET BY MOUTH AT NOON 04/06/21   [name], NP  sucralfate (CARAFATE) 1 g tablet TAKE ONE TABLET BY MOUTH FOUR TIMES DAILY. MAY cut pill in half AND DISSOLVE in water prior TO drinking 03/20/21   [name], NP      Allergies     Ibuprofen    Review of Systems   Review of Systems  Constitutional:  Negative for fever.  HENT: Negative.    Eyes: Negative.   Respiratory:  Positive for cough and shortness of breath.   Cardiovascular:  Positive for chest pain.  Gastrointestinal:  Negative for abdominal pain and vomiting.  Endocrine: Negative.   Genitourinary: Negative.   Musculoskeletal: Negative.   Skin:  Negative for rash.  Neurological:  Negative for headaches.  All other systems reviewed and are negative.  Physical Exam Updated Vital Signs BP 101/77    Pulse 78    Temp 98.3 F (36.8 C) (Oral)    Resp 15    SpO2 97%  Physical Exam Vitals and nursing note reviewed.  Constitutional:      General: She is not in acute distress.    Appearance: She is ill-appearing.  HENT:     Head: Atraumatic.     Nose: Nose normal.  Eyes:     Extraocular Movements: Extraocular movements intact.     Conjunctiva/sclera: Conjunctivae normal.  Cardiovascular:     Rate and Rhythm: Normal rate and regular rhythm.     Pulses: Normal pulses.          Radial pulses are 2+ on the right side and 2+ on the left side.       Dorsalis pedis pulses are 2+ on the right side and 2+ on the left side.     Heart sounds: No murmur heard. Pulmonary:     Effort: Pulmonary effort is normal. No accessory muscle usage or respiratory distress.     Breath sounds: Normal breath sounds.  Chest:     Comments: Chest wall tenderness of the right lateral rib cage and left lateral rib cage.  Consistent with muscular pain secondary from coughing. Abdominal:     General: Abdomen is flat. There is no distension.     Palpations: Abdomen is soft.     Tenderness: There is no abdominal tenderness.     Comments: Abdomen soft, nondistended, without guarding or peritoneal signs.  Mild tenderness to palpation in epigastric region.   Musculoskeletal:        General: Normal range of motion.     Cervical back: Normal range of motion.  Skin:    General: Skin is  warm and dry.     Capillary Refill: Capillary refill takes less than 2 seconds.  Neurological:     General: No focal deficit present.     Mental Status: She is alert.  Psychiatric:        Mood and Affect: Mood normal.    ED Results / Procedures / Treatments   Labs (all labs ordered are listed, but only abnormal results are displayed) Labs Reviewed  BASIC METABOLIC PANEL - Abnormal; Notable for the following components:  Result Value   CO2 21 (*)    Creatinine, Ser 1.63 (*)    Calcium 8.2 (*)    GFR, Estimated 31 (*)    All other components within normal limits  CBC - Abnormal; Notable for the following components:   WBC 16.2 (*)    Hemoglobin 11.3 (*)    RDW 15.6 (*)    All other components within normal limits  RESP PANEL BY RT-PCR (FLU A&B, COVID) ARPGX2  CBC WITH DIFFERENTIAL/PLATELET  URINALYSIS, ROUTINE W REFLEX MICROSCOPIC  TROPONIN I (HIGH SENSITIVITY)    EKG None  Radiology DG Chest 2 View  Result Date: 04/21/2021 CLINICAL DATA:  Chest pain with cough. EXAM: CHEST - 2 VIEW COMPARISON:  Chest x-ray 06/11/2020. FINDINGS: There is minimal bibasilar atelectasis. There is no pleural effusion or pneumothorax. No acute fractures are seen. Cardiomediastinal silhouette is within normal limits. IMPRESSION: No active cardiopulmonary disease. Electronically Signed   By: Darliss Cheney M.D.   On: 04/21/2021 15:46    Procedures Procedures    Medications Ordered in ED Medications  ipratropium-albuterol (DUONEB) 0.5-2.5 (3) MG/3ML nebulizer solution 3 mL (3 mLs Nebulization Given 04/21/21 1615)    ED Course/ Medical Decision Making/ A&P Clinical Course as of 04/21/21 2310  Tue Apr 21, 2021  1620 Troponin I (High Sensitivity) Troponin normal [EC]  1621 Basic metabolic panel(!) BMP normal [EC]  1621 DG Chest 2 View Chest x-ray with mild atelectasis but no active infiltrate or cardiopulmonary disease [EC]  1621 EKG with NSR [EC]  1755 CBC(!) CBC with elevated white  count 16.2 [EC]  1755 Resp Panel by RT-PCR (Flu A&B, Covid) Nasopharyngeal Swab Respiratory panel negative [EC]  1755 Urinalysis, Routine w reflex microscopic Urine, Clean Catch Urine negative [EC]  1756 RN notified me that patient blood pressures have been slightly declining.  Post recent blood pressure was 69/48.  Sepsis protocol initiated.  I will proceed with a CT abdomen for sepsis of unknown source.  Additional IV fluids will be administered. [EC]  1806 Resp Panel by RT-PCR (Flu A&B, Covid) Nasopharyngeal Swab Respiratory panel negative [EC]  1826 Hepatic function panel(!) Hepatic function unremarkable [EC]  1835 Protime-INR(!) INR slightly elevated [EC]    Clinical Course User Index [EC] Janell Quiet, PA-C                           Medical Decision Making Amount and/or Complexity of Data Reviewed Labs: ordered. Decision-making details documented in ED Course. Radiology: ordered. Decision-making details documented in ED Course.  Risk Prescription drug management.   History:  LEXII WALSH is a 81 y.o. female with history of MI, COPD, CKD, GERD, hypertension, gout, peptic ulcer disease who presents to the ED for evaluation of cough and flulike symptoms that been ongoing for 4 days.  Patient states she has been coughing aggressively since she was walking in the rain 4 days ago.  Cough is overall nonproductive.  She also complains of chest pain on the right side of her ribs and over the lateral left side of her ribs.  She has been taking albuterol at home without any improvement.  No recent sick contacts, no recent travel.  She did not take any home test.  She denies fevers, runny nose, abdominal pain, N/V/D, urinary symptoms.  Per triage report, patient's initial O2 sat was mid 80s that improved to mid 90s once on 2 L nasal cannula. Additional history obtained from daughter External records from  outside source obtained and reviewed including family med records from her video  visit today. Details: Patient was for to ED due to left-sided chest pain with history of previous MI; suspect COPD exacerbation This patient presents to the ED for concern of cough, chest pain, this involves an extensive number of treatment options, and is a complaint that carries with it a high risk of complications and morbidity.   Differential diagnosis for emergent cause of cough includes but is not limited to upper respiratory infection, lower respiratory infection, allergies, asthma, irritants, foreign body, medications such as ACE inhibitors, reflux, asthma, CHF, lung cancer, interstitial lung disease, psychiatric causes, postnasal drip and postinfectious bronchospasm. The emergent differential diagnosis of chest pain includes: Acute coronary syndrome, pericarditis, aortic dissection, pulmonary embolism, tension pneumothorax, and esophageal rupture. I do not believe the patient has an emergent cause of chest pain, other urgent/non-acute considerations include, but are not limited to: chronic angina, aortic stenosis, cardiomyopathy, myocarditis, mitral valve prolapse, pulmonary hypertension, hypertrophic obstructive cardiomyopathy (HOCM), aortic insufficiency, right ventricular hypertrophy, pneumonia, pleuritis, bronchitis, pneumothorax, tumor, gastroesophageal reflux disease (GERD), esophageal spasm, Mallory-Weiss syndrome, peptic ulcer disease, biliary disease, pancreatitis, functional gastrointestinal pain, cervical or thoracic disk disease or arthritis, shoulder arthritis, costochondritis, subacromial bursitis, anxiety or panic attack, herpes zoster, breast disorders, chest wall tumors, thoracic outlet syndrome, mediastinitis.   Initial impression:  81 year old female on 2 L nasal cannula with oxygen sats at 98% during my evaluation.  She did have significant coughing fit while I was in the room that sounded dry, hacking. At time of evaluation, chest x-ray was negative for infiltrate or pulmonary  disease.  CMP was normal.  CBC with elevated WBC.  I will proceed with COVID flu test and DuoNeb treatment.  Lab Tests and EKG:  I Ordered, reviewed, and interpreted labs and EKG.  The pertinent results in my decision-making regarding them are detailed in the ED course and/or initial impression section above.   Imaging Studies ordered:  I ordered imaging studies including chest x-ray and CT abdomen I independently visualized and interpreted imaging and I agree with the radiologist interpretation. Decisions made regarding results are detailed in the ED course and/or initial impression section above.   Cardiac Monitoring:  The patient was maintained on a cardiac monitor.  I personally viewed and interpreted the cardiac monitored which showed an underlying rhythm of: NSR   Medicines ordered and prescription drug management:  I ordered medication including: DuoNeb for cough 2500 mL lactated Ringer bolus for hypotension and suspected sepsis Metronidazole 500 mg IV for suspected sepsis  Vancomycin as empiric therapy and sepsis protocol Solu-Medrol 125 IV for COPD exacerbation Reevaluation of the patient after these medicines showed that the patient improved Patient's blood pressures improve dramatically with infusion of IV fluids.  I took her off her supplemental oxygen and she was satting between 90 and 95%.  She maintained this while walking and insist that she feels fine.  She does not have any accessory muscle use or signs of respiratory distress. I have reviewed the patients home medicines and have made adjustments as needed    Reevaluation:  After the interventions noted above, I reevaluated the patient and found that they have :improved   Disposition:  After consideration of the diagnostic results, physical exam, history and the patients response to treatment feel that the patent would benefit from discharge with strict return precautions.   COPD exacerbation:  Viral URI  with cough Given patient's overall reassuring work-up and physical exam, it is  likely that her hypotension was secondary to dehydration since she has not been feeling well recently.  Her symptoms improved dramatically with infusion of IV fluids and empiric antibiotics.  She also had symptomatic improvement with DuoNeb treatment.  At this point, patient desires to go home.  She feels well without any respiratory distress or trouble breathing.  I think this is reasonable option although we discussed strict return precautions.  Patient and her daughter are understanding and amenable to plan.  I advised him to go pick up an SPO2 monitor at the pharmacy or drugstore and return if her O2 saturation dipped below 90%.  I sent her home with a prescription for Tessalon for cough, steroids for COPD exacerbation and dual antibiotics given her symptoms and history of COPD. I discussed this case with my attending, Benjiman CoreNathan Pickering, who also agrees that this is a reasonable plan.   Final Clinical Impression(s) / ED Diagnoses Final diagnoses:  COPD exacerbation (HCC)  Viral URI with cough    Rx / DC Orders ED Discharge Orders          Ordered    predniSONE (DELTASONE) 20 MG tablet  Daily        04/21/21 2148    benzonatate (TESSALON) 100 MG capsule  Every 8 hours        04/21/21 2148    amoxicillin-clavulanate (AUGMENTIN) 875-125 MG tablet  Every 12 hours        04/21/21 2148    doxycycline (VIBRAMYCIN) 100 MG capsule  2 times daily        04/21/21 2148              Delight OvensConklin, Clova Morlock R, PA-C 04/21/21 2314    Benjiman CorePickering, Nathan, MD 04/24/21 [phone]

## 2021-04-21 NOTE — Progress Notes (Signed)
Pharmacy Antibiotic Note  Veronica Wolf is a 81 y.o. female admitted on 04/21/2021 with sepsis.  Pharmacy has been consulted for vancomycin and cefepime dosing.  WBC is elevated at 16.2, patient is currently afebrile. Renal function appears to be elevated from baseline with Scr at 1.63.  Plan: Cefepime 2g q24h Vancomycin 1500 mg IV x1 Vancomycin 1000 mg q48h (estimated AUC 508) Metronidazole per MD Monitor renal function and signs of clinical improvement Follow-up cultures     Temp (24hrs), Avg:98.3 F (36.8 C), Min:98.3 F (36.8 C), Max:98.3 F (36.8 C)  Recent Labs  Lab 04/21/21 1516  WBC 16.2*  CREATININE 1.63*    Estimated Creatinine Clearance: 28.2 mL/min (A) (by C-G formula based on SCr of 1.63 mg/dL (H)).    Allergies  Allergen Reactions   Ibuprofen Other (See Comments)    Pt has hx of peptic ulcer and diverticulitis.     Antimicrobials this admission: 1/17 vancomycin >>  1/17 cefepime >>  1/17 metronidazole >>  Dose adjustments this admission: none  Microbiology results: 1/17 BCx: pending 1/17 UCx: pending   Thank you for involving pharmacy in this patient's care.  Arnette Felts, PharmD PGY1 Ambulatory Care Pharmacy Resident 04/21/2021 6:19 PM  **Pharmacist phone directory can be found on [email or web address] listed under [facility]**

## 2021-04-21 NOTE — Telephone Encounter (Signed)
Patient states she feels much better and does not believe she needs to go to emergency room per Melissa's recommendation during their Virtual visit today. She wanted to know if she still had to go, after consulting with CMA pt was advised to still go to the ED for further evaluation. Patient states she feel much better and might wait a few hours before she goes since she doesn't feel that sick anymore.

## 2021-04-22 ENCOUNTER — Ambulatory Visit (INDEPENDENT_AMBULATORY_CARE_PROVIDER_SITE_OTHER): Payer: Medicare HMO | Admitting: Pharmacist

## 2021-04-22 ENCOUNTER — Encounter (HOSPITAL_BASED_OUTPATIENT_CLINIC_OR_DEPARTMENT_OTHER): Payer: Self-pay

## 2021-04-22 ENCOUNTER — Ambulatory Visit (HOSPITAL_BASED_OUTPATIENT_CLINIC_OR_DEPARTMENT_OTHER): Payer: Medicare HMO

## 2021-04-22 ENCOUNTER — Ambulatory Visit (HOSPITAL_BASED_OUTPATIENT_CLINIC_OR_DEPARTMENT_OTHER): Admission: RE | Admit: 2021-04-22 | Payer: Medicare HMO | Source: Ambulatory Visit

## 2021-04-22 DIAGNOSIS — J449 Chronic obstructive pulmonary disease, unspecified: Secondary | ICD-10-CM

## 2021-04-22 DIAGNOSIS — I10 Essential (primary) hypertension: Secondary | ICD-10-CM

## 2021-04-22 DIAGNOSIS — Z79899 Other long term (current) drug therapy: Secondary | ICD-10-CM

## 2021-04-22 DIAGNOSIS — E785 Hyperlipidemia, unspecified: Secondary | ICD-10-CM

## 2021-04-23 LAB — URINE CULTURE: Culture: NO GROWTH

## 2021-04-26 LAB — CULTURE, BLOOD (ROUTINE X 2)
Culture: NO GROWTH
Culture: NO GROWTH
Special Requests: ADEQUATE
Special Requests: ADEQUATE

## 2021-04-26 NOTE — Patient Instructions (Signed)
Mrs. Yarish It was a pleasure speaking with you today.  I have attached a summary of our visit today and information about your health goals.   Patient self care activities - Over the next 90 days, patient will: Focus on medication adherence by filling and taking medications appropriately  Take medications as prescribed Report any questions or concerns to PharmD and/or provider(s) Transition over from [place] to [place], PharmD if any issues with medications [phone] Start antibiotics and prednisone prescribed at ED. Also may use benzonatate if needed for cough.   Please call the care guide team at [phone] if you need to cancel or reschedule your appointment.   If you have any questions or concerns, please feel free to contact me either at the phone number below or with a MyChart message.     Cherre Robins, PharmD Clinical Pharmacist [place] [phone] (direct line)  [phone] (main office number)   The patient verbalized understanding of instructions, educational materials, and care plan provided today and agreed to receive a mailed copy of patient instructions, educational materials, and care plan.

## 2021-04-26 NOTE — Chronic Care Management (AMB) (Signed)
Chronic Care Management Pharmacy Note  04/26/2021 Name:  Veronica Wolf MRN:  [id number] DOB:  [birth year]  Summary: Checked in with patient after ED visit. She reports she has not picked up antibiotics and prednisone that was prescribed but her grandson is on his way to [facility] to do so now. I encouraged her to start as soon as she gets the prescriptions and emphasized the importance of taking these medications as prescribed to prevent worsening of her COPD exacerbation and hospitalization.   Subjective: Veronica Wolf is an 81 y.o. year old female who is a primary patient of Debbrah Alar, NP.  The CCM team was consulted for assistance with disease management and care coordination needs.    Engaged with patient by telephone for follow up visit in response to provider referral for pharmacy case management and/or care coordination services.   Consent to Services:  The patient was given information about Chronic Care Management services, agreed to services, and gave verbal consent prior to initiation of services.  Please see initial visit note for detailed documentation.   Patient Care Team: Debbrah Alar, NP as PCP - General (Internal Medicine) Revankar, Reita Cliche, MD as PCP - Cardiology (Cardiology) Cherre Robins, [place] (Pharmacist) Luretha Rued, RN as Archie Management  Recent office visits: 04/21/2021 - Interal med Inda Castle) Phone visit for cough, shortness of breath and wheezing. Patient encouraged to go to ED.  02/16/2021 - Phone Call  to PCP office regarding rash under her breast and stomach. Debbrah Alar sent in prescription for nystatin powder.  02/16/2021 - Internal Med Inda Castle) F/U anxiety and stomach pain. Referred to GI for abdominal pain. UDS ordered. BP noted to be low - no medication changes. Continue to minitor BP.  01/09/2021 - PCP Inda Castle, NP) Seen for epigatric / chest pain. Labs ordered - CBC, BMP and lipase.  Also ordered CT abdomin. No med changes noted.  12/03/2020- PCP Inda Castle, NP) Seen for back pain. Prescribed Medrol dose pack and ordered Xay. Also checked urinalysis and recommended she complete course of ciprofloxacin given while she was in [place].  09/16/2020 - PCP Phone Call. Ultrasound showed fatty liver. Recommended follow up with GI.  09/08/2020 - PCP Inda Castle) Seen for abdominal pain and diarrhea. Hemoccult negative. Reordered Abdominal CT, LFTs and lipase, CBC. No medication changes  Recent consult visits: 09/17/2020 - Cardio (Dr Harriet Masson) CAD / intermittant chest pain. Started low dose Imdue $RemoveBe'15mg'NLuorDEAR$  daily and ezetimibe $RemoveBefor'10mg'dyOACzQfRqwf$  daily. Ordered nucular stress test   Hospital visits: 04/21/2021 ED Visit at [facility]. URI leading to COPD exacerbation. Prescribed doxycycline, Augmentin, prednisone and benzonatate at discharge.    Objective:  Lab Results  Component Value Date   CREATININE 1.63 (H) 04/21/2021   CREATININE 1.40 (H) 01/09/2021   CREATININE 1.38 (H) 08/18/2020    Lab Results  Component Value Date   HGBA1C 6.5 01/09/2021   Last diabetic Eye exam: No results found for: HMDIABEYEEXA  Last diabetic Foot exam: No results found for: HMDIABFOOTEX      Component Value Date/Time   CHOL 204 (H) 08/18/2020 1203   TRIG 182.0 (H) 08/18/2020 1203   HDL 42.30 08/18/2020 1203   CHOLHDL 5 08/18/2020 1203   VLDL 36.4 08/18/2020 1203   LDLCALC 125 (H) 08/18/2020 1203   LDLCALC 108 (H) 03/14/2020 1439    Hepatic Function Latest Ref Rng & Units 04/21/2021 01/09/2021 09/08/2020  Total Protein 6.5 - 8.1 g/dL 6.6 6.7 6.3  Albumin 3.5 - 5.0 g/dL  3.1(L) 3.8 3.6  AST 15 - 41 U/L 14(L) 14 10  ALT 0 - 44 U/L $Remo'10 6 5  'FodEW$ Alk Phosphatase 38 - 126 U/L 70 82 64  Total Bilirubin 0.3 - 1.2 mg/dL 0.5 0.3 0.4  Bilirubin, Direct 0.0 - 0.2 mg/dL 0.1 - 0.1    Lab Results  Component Value Date/Time   TSH 3.57 08/18/2020 12:03 PM   TSH 1.77 03/14/2020 02:39 PM   FREET4 0.85 01/24/2018  03:04 PM    CBC Latest Ref Rng & Units 04/21/2021 09/08/2020 06/11/2020  WBC 4.0 - 10.5 K/uL 16.2(H) 7.3 9.1  Hemoglobin 12.0 - 15.0 g/dL 11.3(L) 11.6(L) 11.5(L)  Hematocrit 36.0 - 46.0 % 36.1 36.1 38.1  Platelets 150 - 400 K/uL 210 219.0 219    No results found for: VD25OH  Clinical ASCVD: Yes  The ASCVD Risk score (Arnett DK, et al., 2019) failed to calculate for the following reasons:   The 2019 ASCVD risk score is only valid for ages 110 to 84   The patient has a prior MI or stroke diagnosis    Other:  DEXA 03/05/2014             AP LUMBAR SPINE not used for calculation due to significant degenerative changes.             Left FOREARM (1/3 RADIUS) T Score: -0.4             Left FEMUR neck T-Score: -0.7     Social History   Tobacco Use  Smoking Status Former   Types: Cigarettes   Quit date: 04/24/2000   Years since quitting: 21.0  Smokeless Tobacco Never   BP Readings from Last 3 Encounters:  04/21/21 (!) 108/59  02/16/21 (!) 102/56  01/09/21 (!) 143/93   Pulse Readings from Last 3 Encounters:  04/21/21 76  02/16/21 82  01/09/21 88   Wt Readings from Last 3 Encounters:  04/13/21 182 lb (82.6 kg)  02/16/21 182 lb (82.6 kg)  01/09/21 189 lb (85.7 kg)    Assessment: Review of patient past medical history, allergies, medications, health status, including review of consultants reports, laboratory and other test data, was performed as part of comprehensive evaluation and provision of chronic care management services.   SDOH:  (Social Determinants of Health) assessments and interventions performed:      CCM Care Plan  Allergies  Allergen Reactions   Ibuprofen Other (See Comments)    Pt has hx of peptic ulcer and diverticulitis.     Medications Reviewed Today     Reviewed by Cherre Robins, RPH-CPP (Pharmacist) on 04/26/21 at [place] List Status: <None>   Medication Order Taking? Sig Documenting Provider Last Dose Status Informant  albuterol (PROVENTIL) (2.5  MG/3ML) 0.083% nebulizer solution [id number] Yes USE 1 VIAL IN NEBULIZER EVERY 6 HOURS AS NEEDED FOR WHEEZING AND FOR SHORTNESS OF [name], Lenna Sciara, NP Taking Active   allopurinol (ZYLOPRIM) 100 MG tablet [id number] Yes Take 2 tablets (200 mg total) by mouth daily. Debbrah Alar, NP Taking Active   amoxicillin-clavulanate (AUGMENTIN) 875-125 MG tablet [id number] No Take 1 tablet by mouth every 12 (twelve) hours for 5 days.  Patient not taking: Reported on 04/26/2021   Rodena Piety Not Taking Active   aspirin 81 MG EC tablet [id number] Yes Take 1 tablet (81 mg total) by mouth daily. Swallow whole. Berniece Salines, DO Taking Active   atorvastatin (LIPITOR) 80 MG tablet [id number] Yes Take 1 tablet (80 mg total) by  mouth daily. Debbrah Alar, NP Taking Active   benzonatate (TESSALON) 100 MG capsule [id number] No Take 1 capsule (100 mg total) by mouth every 8 (eight) hours.  Patient not taking: Reported on 04/26/2021   Rodena Piety Not Taking Active   colchicine 0.6 MG tablet [id number] Yes Take 0.6 mg by mouth daily as needed (gout). [provider] Taking Active   diazepam (VALIUM) 10 MG tablet [id number] Yes TAKE ONE TABLET BY MOUTH EVERY TWELVE HOURS AS NEEDED FOR ANXIETY Debbrah Alar, NP Taking Active   dicyclomine (BENTYL) 10 MG capsule [id number] Yes Take 1 capsule (10 mg total) by mouth in the morning, at noon, in the evening, and at bedtime. Debbrah Alar, NP Taking Active   doxycycline (VIBRAMYCIN) 100 MG capsule [id number] No Take 1 capsule (100 mg total) by mouth 2 (two) times daily for 5 days.  Patient not taking: Reported on 04/26/2021   Tonye Pearson, PA-C Not Taking Active   escitalopram (LEXAPRO) 20 MG tablet [id number] Yes Take 1 tablet (20 mg total) by mouth daily. Debbrah Alar, NP Taking Active   ezetimibe (ZETIA) 10 MG tablet [id number] Yes Take 1 tablet (10 mg total) by mouth daily. Debbrah Alar, NP Taking Active    famotidine (PEPCID) 20 MG tablet [id number] Yes Take 1 tablet (20 mg total) by mouth 2 (two) times daily. In the morning and at bedtime Colon [place], MD Taking Active   isosorbide mononitrate (IMDUR) 30 MG 24 hr tablet [id number] Yes Take 1 tablet (30 mg total) by mouth daily. Berniece Salines, DO Taking Active   levothyroxine (SYNTHROID) 25 MCG tablet [id number] Yes TAKE ONE TABLET BY MOUTH EVERY MORNING Debbrah Alar, NP Taking Active   lisinopril (ZESTRIL) 2.5 MG tablet [id number] Yes Take 1 tablet (2.5 mg total) by mouth daily. Debbrah Alar, NP Taking Active   meclizine (ANTIVERT) 25 MG tablet [id number] Yes TAKE ONE TABLET BY MOUTH twive daily AS NEEDED FOR dizziness Debbrah Alar, NP Taking Active   metoprolol succinate (TOPROL-XL) 25 MG 24 hr tablet [id number] Yes Take 0.5 tablets (12.5 mg total) by mouth daily. Debbrah Alar, NP Taking Active   nitroGLYCERIN (NITROSTAT) 0.4 MG SL tablet [id number] Yes Place 1 tablet (0.4 mg total) under the tongue every 5 (five) minutes as needed for chest pain. Berniece Salines, DO Taking Active   nystatin (MYCOSTATIN/NYSTOP) powder [id number] Yes APPLY POWDER TOPICALLY TO AFFECTED AREA 2 TIMES DAILY Debbrah Alar, NP Taking Active   pantoprazole (PROTONIX) 40 MG tablet [id number] Yes Take 1 tablet (40 mg total) by mouth 2 (two) times daily. Debbrah Alar, NP Taking Active   potassium chloride SA (KLOR-CON M) 20 MEQ tablet [id number] Yes TAKE ONE TABLET BY MOUTH AT [place] Bruin, Lenna Sciara, NP Taking Active   predniSONE (DELTASONE) 20 MG tablet [id number] No Take 2 tablets (40 mg total) by mouth daily for 5 days.  Patient not taking: Reported on 04/26/2021   Tonye Pearson, [place] Not Taking Active   sucralfate (CARAFATE) 1 g tablet [id number] Yes TAKE ONE TABLET BY MOUTH FOUR TIMES DAILY. MAY cut pill in half AND DISSOLVE in water prior TO drinking Debbrah Alar, NP Taking Active             Patient Active Problem List    Diagnosis Date Noted   Cough 04/21/2021   Left otitis media 02/16/2021   Back pain 12/03/2020   Acute cystitis without hematuria 12/03/2020   Grief reaction 12/03/2020   Fatty liver 12/01/2020   Intermittent left-sided chest  pain 09/17/2020   Coronary artery disease involving native coronary artery of native heart 09/29/9483   Metabolic syndrome 46/27/0350   Prediabetes 09/17/2020   Allergy 09/16/2020   Anxiety 09/16/2020   Hypertension 09/16/2020   Vertigo 09/16/2020   Dark stools 09/08/2020   Dysphagia    Esophageal stricture    Hiatal hernia    Epigastric pain    Hypothyroid 01/25/2018   Mild CAD 10/03/2017   Stress-induced cardiomyopathy 09/19/2017   COPD exacerbation ([place]) 09/14/2017   Hypokalemia 09/14/2017   Non-ST elevation (NSTEMI) myocardial infarction [facility]) - due to Demand Infarction 09/14/2017   CKD (chronic kidney disease) 04/04/2017   HLD (hyperlipidemia)    Allergic rhinitis    Gout 02/28/2017   Depression with anxiety 02/28/2017   Epigastric abdominal pain 12/22/2016   COPD with chronic bronchitis ([place]) 12/22/2016   GERD (gastroesophageal reflux disease) 12/22/2016   DOE (dyspnea on exertion) 12/22/2016   Peptic ulcer 01/04/2003    Immunization History  Administered Date(s) Administered   Fluad Quad(high Dose 65+) 03/14/2020, 12/03/2020   Influenza, High Dose Seasonal PF 01/23/2018, 12/07/2018   Influenza-Unspecified 12/23/2018   PFIZER(Purple Top)SARS-COV-2 Vaccination 05/14/2019, 06/08/2019   Pneumococcal Conjugate-13 07/16/2015   Pneumococcal Polysaccharide-23 12/23/2016   Tdap 09/02/2009, 04/05/2017    Conditions to be addressed/monitored: CAD, HTN, HLD, COPD, Anxiety, Depression and gout, GERD ; hypothyroidism  Care Plan : General Pharmacy (Adult)  Updates made by Cherre Robins, RPH-CPP since 04/26/2021 12:00 AM     Problem: Medication management and Monitoring and Coordination of Care for Chronic Conditions   Priority: High  Onset Date:  09/02/2020     Long-Range Goal: Medication management   Start Date: 09/02/2020  Recent Progress: On track  Priority: High  Note:    Current Barriers:  Unable to achieve control of hyperlipidemia  Does not adhere to prescribed medication regimen Does not maintain contact with provider office  Pharmacist Clinical Goal(s):  Over the next 90 days, patient will achieve adherence to monitoring guidelines and medication adherence to achieve therapeutic efficacy achieve control of hyperlipidemia as evidenced by LDL < 70 adhere to prescribed medication regimen as evidenced by filling prescriptions on times contact provider office for questions/concerns as evidenced notation of same in electronic health record through collaboration with PharmD and provider.   Interventions: 1:1 collaboration with Debbrah Alar, NP regarding development and update of comprehensive plan of care as evidenced by provider attestation and co-signature Inter-disciplinary care team collaboration (see longitudinal plan of care) Comprehensive medication review performed; medication list updated in electronic medical record  Hypertension: Controlled though blood pressure was a little low at last office visit BP goal is:  <130/80  Patient is not checking BP at home - states she does not have BP cuff.  Patient has failed these meds in the past: None noted  Current Regimen: Metoprolol Succinate 25mg  0.5 tab daily Lisinopril 2.5mg  daily Interventions:  Reviewed signs and symptoms of low blood pressure to monitor for.  Discussed blood pressure  goal Continue current medications for blood pressure Will plan to assist patient if she would like with requesting blood pressure cuff from [facility] (new for her in 2023) over-the-counter benefits once she feels a little better.     Hyperlipidemia / history of MI: Not controlled; LDL goal < 70  Patient has failed these meds in past: None noted   In September 2022 patient  reported she just stopped taking atorvastatin because she was taking so many other medications. She has been taking atrovastatin regularly  since.  Current therapy Atorvastatin 80mg  daily (increased 08/18/2020) Ezetimibe 10mg  daily (added 09/17/2020)  Metoprolol Succinate 25mg  1/2 tab daily Aspirin 81mg  daily  Nitroglycerin 0.4mg  - place 1 tablet under tongue as needed for chest pain, may repeat in 5 minutes if chest pain not relieved. Interventions: (addressed at previous visit) Discussed LDL goal Discussed importance of taking atorvastatin daily to both lower cholesterol and prevent ASCVD / recurrent MI; Adherence has improved since she started adherence packaging. Consider rechecking lipids with next labs since has not been rechecked since atorvastatin increased and ezetimibe added Reminded patient to reschedule appointment with cardiologist.   Chronic Obstructive Pulmonary Disease: Uncontrolled; current exacerbation due to upper respiratory infection. Patient was seen in ED yesterday. Prescribed prednisone, doxycycline, Augmentin and benzonatate. She has not started yet. She reports her grandson is out at the pharmacy now to get these prescriptions Patient's breathing was much better than when I spoke to her 2 days ago. She still has some wheezing and cough.  Current treatment: Albuterol solution for nebulizer - use in nebulizer up to every 6 hours as needed for shortness of breath Interventions:  Encouraged patient to start antibiotics and prednisone TODAY. Important to prevent worsening of her condition and hospitalization Consider in future if escalation in therapy is needed.  Depression/Anxiety:  Improving; goal: decrease use of diazepam and decrease number of days per month with symptoms of grief / depression Current treatment: Escitalopram 20mg  daily Diazepam 10mg  every 12 hours as needed  Patient has failed these meds in past: citalopram (ineffective) She still reports some  days that she is sad when she thinks about her brother's passing. Patient has declined referral in past for grief counseling.  Patient was using diazepam frequently but today she reports that she takes usually only 1 tablet at bedtime but sometimes takes a second dose if needed.  Her refill history shows that she last filled diazepam for #10 tablets on 03/03/2021 Interventions: (addressed at previous visit) Discussed adherence and importance of taking escitalopram for depression / anxiety Encouraged patient to continue to use diazepam sparingly - only at night if able.  Consider grief counseling (patient continues to decline)  Hypothyroidism:  Patient is currently controlled on the following medications:  levothyroxine 82mcg daily  TSH was WNL at last check on 08/18/2020 Patient has failed these meds in past: None noted  Intervention: (addressed at previous visit)  Continue current therapy   Gout:   Goal Uric Acid < 6 mg/dL   Last uric acid was not at goal (UA = 8.4 on 03/14/2020) but patient denies symptoms of gout at this time.  Current medications:  Allopurinol 100mg  - take 2 tablets = 200mg  daily Colchicine 0.6mg  up to bid as needed for acute gout Medications that may increase uric acid levels: Aspirin Last gout flare: Feb 2022 Patient has failed these meds in past: None noted   Interventions: (addressed at previous visit) Reviewed preventative versus treatment medications for gout.  Continue current therapy Consider checking Uric acid with next labs.    Medication management:   She started medication adherence packaging 01/08/2021 and really likes it. Has helped her with remembering to take her medication on time.  Noted that patient had requested prescriptions to be sent to [place] in 2022 but her [facility] did not start until 2023. It appears [facility] has filled some of her medications. Patient is aware that [facility] will not package her medications as her current  pharmacy is doing.  She is weighing her  options and trying to decide which she thinks is best. Assured patient that we will help her coordinate with whichever pharmacy service she decides to utilize.  03/24/2021: Coordinated packaging. Discussed plan to get 1 more refill from [facility].  Current pharmacy:  [place] but switching to [place] Intervention:  Comprehensive medication review completed.    Plan was to assist patient with medications and assist with getting from either [facility] or [facility] / [facility].  Encouraged patient to start antibiotics and other medications she was prescribed in the ED as soon as she gets them today.    Patient Goals/Self-Care Activities Over the next 90 days, patient will:  take medications as prescribed, focus on medication adherence by filing prescriptions at one pharmacy, and call if any issues with medications   Follow Up Plan:  will check back in with patient in 3 to 7 days to assist with medication refills and transition to [place]         Medication Assistance: None required.  Patient affirms current coverage meets needs.  Patient's preferred pharmacy is:  [place], [place] - [address] [place] [address] Phone: [phone] Fax: [phone]  [place], [place] - [address] [place] [address] Phone: [phone] Fax: [phone]  [facility] - [place], [place] [address] Phone: [phone] Fax: [phone]   Follow Up:  Patient agrees to Care Plan and Follow-up.  Plan: follow up in 7 to 10 days  Cherre Robins, PharmD Clinical Pharmacist [place] [phone]

## 2021-04-27 ENCOUNTER — Telehealth: Payer: Self-pay | Admitting: Pharmacist

## 2021-04-27 ENCOUNTER — Ambulatory Visit: Payer: Medicare HMO | Admitting: Pharmacist

## 2021-04-27 DIAGNOSIS — E785 Hyperlipidemia, unspecified: Secondary | ICD-10-CM

## 2021-04-27 DIAGNOSIS — F418 Other specified anxiety disorders: Secondary | ICD-10-CM

## 2021-04-27 DIAGNOSIS — I10 Essential (primary) hypertension: Secondary | ICD-10-CM

## 2021-04-27 DIAGNOSIS — Z79899 Other long term (current) drug therapy: Secondary | ICD-10-CM

## 2021-04-27 DIAGNOSIS — I2510 Atherosclerotic heart disease of native coronary artery without angina pectoris: Secondary | ICD-10-CM

## 2021-04-27 MED ORDER — ISOSORBIDE MONONITRATE ER 30 MG PO TB24: 30.0000 mg | ORAL_TABLET | Freq: Every day | ORAL | 1 refills | Status: DC

## 2021-04-27 NOTE — Telephone Encounter (Signed)
I spoke with Lauren at [facility]. I had requested that patient received 1 month of packaging 03/31/2021 to carry her through December and January until patient decided if she wanted to change to [facility] or stay with [facility]. Because [facility] did not fill 03/31/2021, [facility] filled many of patient's prescriptions on 04/05/2021. However it does not look like they filled all her prescriptions.  Called [employer] [facility]. The following medications were filled 04/05/2021 and mailed to patient 04/07/2021: potassium supplement, escitalopram, atorvastatin, pantoprazole, sucralfate, dicyclomine, lisinopril, ezetimibe, albuterol, metoprolol   The following medications were NOT filled: allopurinol, isosorbide, levothyroxine, famotidine  See note for phone visit for additional information

## 2021-04-28 NOTE — Chronic Care Management (AMB) (Signed)
Chronic Care Management Pharmacy Note  04/28/2021 Name:  Veronica Wolf MRN:  [id number] DOB:  May 29, 1940  Summary: Patient has started medications prescribed in ED last week.  She has received first delivery from [facility] and very confused about how to take her medications. She will get weekly pill containers. I will meet with her in 2 days to assist in filling weekly pill containers. Will continue this until she is able to restart packaging from [facility] - will be able to restart around 06/17/2021.  Coordinated with [facility] to fill 4 needed medications - allopurinol, levothyroxine, isosorbide and famotidine  Subjective: Veronica Wolf is an 81 y.o. year old female who is a primary patient of Debbrah Alar, NP.  The CCM team was consulted for assistance with disease management and care coordination needs.    Engaged with patient by telephone for follow up visit in response to provider referral for pharmacy case management and/or care coordination services.   Consent to Services:  The patient was given information about Chronic Care Management services, agreed to services, and gave verbal consent prior to initiation of services.  Please see initial visit note for detailed documentation.   Patient Care Team: Debbrah Alar, NP as PCP - General (Internal Medicine) Revankar, Reita Cliche, MD as PCP - Cardiology (Cardiology) Cherre Robins, [place] (Pharmacist) Luretha Rued, RN as [place] Management  Recent office visits: 04/21/2021 - Interal med [facility]) Phone visit for cough, shortness of breath and wheezing. Patient encouraged to go to ED.  02/16/2021 - Phone Call  to PCP office regarding rash under her breast and stomach. Debbrah Alar sent in prescription for nystatin powder.  02/16/2021 - Internal Med [facility]) F/U anxiety and stomach pain. Referred to GI for abdominal pain. UDS ordered. BP noted to be low - no medication changes. Continue to  minitor BP.  01/09/2021 - PCP Inda [facility], NP) Seen for epigatric / chest pain. Labs ordered - CBC, BMP and lipase. Also ordered CT abdomin. No med changes noted.  12/03/2020- PCP Inda [facility], NP) Seen for back pain. Prescribed Medrol dose pack and ordered Xay. Also checked urinalysis and recommended she complete course of ciprofloxacin given while she was in [place].  09/16/2020 - PCP Phone Call. Ultrasound showed fatty liver. Recommended follow up with GI.  09/08/2020 - PCP Inda [facility]) Seen for abdominal pain and diarrhea. Hemoccult negative. Reordered Abdominal CT, LFTs and lipase, CBC. No medication changes  Recent consult visits: 09/17/2020 - Cardio (Dr Harriet Masson) CAD / intermittant chest pain. Started low dose Imdue $RemoveBe'15mg'pJwgBFOzl$  daily and ezetimibe $RemoveBefor'10mg'TWWdVqjyRlfl$  daily. Ordered nucular stress test   Hospital visits: 04/21/2021 ED Visit at [facility]. URI leading to COPD exacerbation. Prescribed doxycycline, Augmentin, prednisone and benzonatate at discharge.    Objective:  Lab Results  Component Value Date   CREATININE 1.63 (H) 04/21/2021   CREATININE 1.40 (H) 01/09/2021   CREATININE 1.38 (H) 08/18/2020    Lab Results  Component Value Date   HGBA1C 6.5 01/09/2021   Last diabetic Eye exam: No results found for: HMDIABEYEEXA  Last diabetic Foot exam: No results found for: HMDIABFOOTEX      Component Value Date/Time   CHOL 204 (H) 08/18/2020 1203   TRIG 182.0 (H) 08/18/2020 1203   HDL 42.30 08/18/2020 1203   CHOLHDL 5 08/18/2020 1203   VLDL 36.4 08/18/2020 1203   LDLCALC 125 (H) 08/18/2020 1203   LDLCALC 108 (H) 03/14/2020 1439    Hepatic Function Latest Ref Rng & Units 04/21/2021 01/09/2021  09/08/2020  Total Protein 6.5 - 8.1 g/dL 6.6 6.7 6.3  Albumin 3.5 - 5.0 g/dL 3.1(L) 3.8 3.6  AST 15 - 41 U/L 14(L) 14 10  ALT 0 - 44 U/L 10 6 5   Alk Phosphatase 38 - 126 U/L 70 82 64  Total Bilirubin 0.3 - 1.2 mg/dL 0.5 0.3 0.4  Bilirubin, Direct 0.0 - 0.2 mg/dL 0.1 - 0.1    Lab Results   Component Value Date/Time   TSH 3.57 08/18/2020 12:03 PM   TSH 1.77 03/14/2020 02:39 PM   FREET4 0.85 01/24/2018 03:04 PM    CBC Latest Ref Rng & Units 04/21/2021 09/08/2020 06/11/2020  WBC 4.0 - 10.5 K/uL 16.2(H) 7.3 9.1  Hemoglobin 12.0 - 15.0 g/dL 11.3(L) 11.6(L) 11.5(L)  Hematocrit 36.0 - 46.0 % 36.1 36.1 38.1  Platelets 150 - 400 K/uL 210 219.0 219    No results found for: VD25OH  Clinical ASCVD: Yes  The ASCVD Risk score (Arnett DK, et al., 2019) failed to calculate for the following reasons:   The 2019 ASCVD risk score is only valid for ages 57 to 38   The patient has a prior MI or stroke diagnosis    Other:  DEXA 03/05/2014             AP LUMBAR SPINE not used for calculation due to significant degenerative changes.             Left FOREARM (1/3 RADIUS) T Score: -0.4             Left FEMUR neck T-Score: -0.7     Social History   Tobacco Use  Smoking Status Former   Types: Cigarettes   Quit date: 04/24/2000   Years since quitting: 21.0  Smokeless Tobacco Never   BP Readings from Last 3 Encounters:  04/21/21 (!) 108/59  02/16/21 (!) 102/56  01/09/21 (!) 143/93   Pulse Readings from Last 3 Encounters:  04/21/21 76  02/16/21 82  01/09/21 88   Wt Readings from Last 3 Encounters:  04/13/21 182 lb (82.6 kg)  02/16/21 182 lb (82.6 kg)  01/09/21 189 lb (85.7 kg)    Assessment: Review of patient past medical history, allergies, medications, health status, including review of consultants reports, laboratory and other test data, was performed as part of comprehensive evaluation and provision of chronic care management services.   SDOH:  (Social Determinants of Health) assessments and interventions performed:      CCM Care Plan  Allergies  Allergen Reactions   Ibuprofen Other (See Comments)    Pt has hx of peptic ulcer and diverticulitis.     Medications Reviewed Today     Reviewed by 03/11/21, RPH-CPP (Pharmacist) on 04/28/21 at 2044  Med List Status:  <None>   Medication Order Taking? Sig Documenting Provider Last Dose Status Informant  albuterol (PROVENTIL) (2.5 MG/3ML) 0.083% nebulizer solution 2045 Yes USE 1 VIAL IN NEBULIZER EVERY 6 HOURS AS NEEDED FOR WHEEZING AND FOR SHORTNESS OF [id number], Garnet Sierras, NP Taking Active   allopurinol (ZYLOPRIM) 100 MG tablet Efraim Kaufmann No Take 2 tablets (200 mg total) by mouth daily.  Patient not taking: Reported on 04/27/2021   04/29/2021, NP Not Taking Active   aspirin 81 MG EC tablet [name] Yes Take 1 tablet (81 mg total) by mouth daily. Swallow whole. Tobb, [id number], DO Taking Active   atorvastatin (LIPITOR) 80 MG tablet Lavona Mound Yes Take 1 tablet (80 mg total) by mouth daily. [id number], NP Taking Active   benzonatate (  TESSALON) 100 MG capsule [id number] Yes Take 1 capsule (100 mg total) by mouth every 8 (eight) hours. Tonye Pearson, PA-C Taking Active   colchicine 0.6 MG tablet [id number] Yes Take 0.6 mg by mouth daily as needed (gout). [provider] Taking Active   diazepam (VALIUM) 10 MG tablet [id number] Yes TAKE ONE TABLET BY MOUTH EVERY TWELVE HOURS AS NEEDED FOR ANXIETY Debbrah Alar, NP Taking Active   dicyclomine (BENTYL) 10 MG capsule [id number] Yes Take 1 capsule (10 mg total) by mouth in the morning, at noon, in the evening, and at bedtime. Debbrah Alar, NP Taking Active   escitalopram (LEXAPRO) 20 MG tablet [id number] Yes Take 1 tablet (20 mg total) by mouth daily. Debbrah Alar, NP Taking Active   ezetimibe (ZETIA) 10 MG tablet [id number] Yes Take 1 tablet (10 mg total) by mouth daily. Debbrah Alar, NP Taking Active   famotidine (PEPCID) 20 MG tablet [id number] No Take 1 tablet (20 mg total) by mouth 2 (two) times daily. In the morning and at bedtime  Patient not taking: Reported on 04/27/2021   Colon Branch, MD Not Taking Active   isosorbide mononitrate (IMDUR) 30 MG 24 hr tablet [id number]  Take 1 tablet (30 mg total) by mouth  daily. Mosie Lukes, MD  Active   levothyroxine (SYNTHROID) 25 MCG tablet [id number] No TAKE ONE TABLET BY MOUTH EVERY MORNING  Patient not taking: Reported on 04/27/2021   Debbrah Alar, NP Not Taking Active   lisinopril (ZESTRIL) 2.5 MG tablet [id number] Yes Take 1 tablet (2.5 mg total) by mouth daily. Debbrah Alar, NP Taking Active   meclizine (ANTIVERT) 25 MG tablet [id number] Yes TAKE ONE TABLET BY MOUTH twive daily AS NEEDED FOR dizziness Debbrah Alar, NP Taking Active   metoprolol succinate (TOPROL-XL) 25 MG 24 hr tablet [id number] Yes Take 0.5 tablets (12.5 mg total) by mouth daily. Debbrah Alar, NP Taking Active   nitroGLYCERIN (NITROSTAT) 0.4 MG SL tablet [id number] Yes Place 1 tablet (0.4 mg total) under the tongue every 5 (five) minutes as needed for chest pain. Berniece Salines, DO Taking Active   nystatin (MYCOSTATIN/NYSTOP) powder [id number] Yes APPLY POWDER TOPICALLY TO AFFECTED AREA 2 TIMES DAILY Debbrah Alar, NP Taking Active   pantoprazole (PROTONIX) 40 MG tablet [id number] Yes Take 1 tablet (40 mg total) by mouth 2 (two) times daily. Debbrah Alar, NP Taking Active   potassium chloride SA (KLOR-CON M) 20 MEQ tablet [id number] Yes TAKE ONE TABLET BY MOUTH AT Gu[place] Bruin, Lenna Sciara, NP Taking Active   sucralfate (CARAFATE) 1 g tablet [id number] Yes TAKE ONE TABLET BY MOUTH FOUR TIMES DAILY. MAY cut pill in half AND DISSOLVE in water prior TO drinking Debbrah Alar, NP Taking Active             Patient Active Problem List   Diagnosis Date Noted   Cough 04/21/2021   Left otitis media 02/16/2021   Back pain 12/03/2020   Acute cystitis without hematuria 12/03/2020   Grief reaction 12/03/2020   Fatty liver 12/01/2020   Intermittent left-sided chest pain 09/17/2020   Coronary artery disease involving native coronary artery of native heart 28/00/3491   Metabolic syndrome 79/15/0569   Prediabetes 09/17/2020   Allergy 09/16/2020   Anxiety  09/16/2020   Hypertension 09/16/2020   Vertigo 09/16/2020   Dark stools 09/08/2020   Dysphagia    Esophageal stricture    Hiatal hernia    Epigastric pain    Hypothyroid 01/25/2018   Mild CAD 10/03/2017   Stress-induced  cardiomyopathy 09/19/2017   COPD exacerbation ([place]) 09/14/2017   Hypokalemia 09/14/2017   Non-ST elevation (NSTEMI) myocardial infarction [facility]) - due to Demand Infarction 09/14/2017   CKD (chronic kidney disease) 04/04/2017   HLD (hyperlipidemia)    Allergic rhinitis    Gout 02/28/2017   Depression with anxiety 02/28/2017   Epigastric abdominal pain 12/22/2016   COPD with chronic bronchitis ([place]) 12/22/2016   GERD (gastroesophageal reflux disease) 12/22/2016   DOE (dyspnea on exertion) 12/22/2016   Peptic ulcer 01/04/2003    Immunization History  Administered Date(s) Administered   Fluad Quad(high Dose 65+) 03/14/2020, 12/03/2020   Influenza, High Dose Seasonal PF 01/23/2018, 12/07/2018   Influenza-Unspecified 12/23/2018   PFIZER(Purple Top)SARS-COV-2 Vaccination 05/14/2019, 06/08/2019   Pneumococcal Conjugate-13 07/16/2015   Pneumococcal Polysaccharide-23 12/23/2016   Tdap 09/02/2009, 04/05/2017    Conditions to be addressed/monitored: CAD, HTN, HLD, COPD, Anxiety, Depression and gout, GERD ; hypothyroidism  Care Plan : General Pharmacy (Adult)  Updates made by Cherre Robins, RPH-CPP since 04/28/2021 12:00 AM     Problem: Medication management and Monitoring and Coordination of Care for Chronic Conditions   Priority: High  Onset Date: 09/02/2020     Long-Range Goal: Medication management   Start Date: 09/02/2020  Recent Progress: On track  Priority: High  Note:    Current Barriers:  Unable to achieve control of hyperlipidemia  Does not adhere to prescribed medication regimen Does not maintain contact with provider office  Pharmacist Clinical Goal(s):  Over the next 90 days, patient will achieve adherence to monitoring guidelines and medication  adherence to achieve therapeutic efficacy achieve control of hyperlipidemia as evidenced by LDL < 70 adhere to prescribed medication regimen as evidenced by filling prescriptions on times contact provider office for questions/concerns as evidenced notation of same in electronic health record through collaboration with PharmD and provider.   Interventions: 1:1 collaboration with Debbrah Alar, NP regarding development and update of comprehensive plan of care as evidenced by provider attestation and co-signature Inter-disciplinary care team collaboration (see longitudinal plan of care) Comprehensive medication review performed; medication list updated in electronic medical record  Hypertension: Controlled though blood pressure was a little low at last office visit BP goal is:  <130/80  Patient is not checking BP at home - states she does not have BP cuff.  Patient has failed these meds in the past: None noted  Current Regimen: Metoprolol Succinate 25mg  0.5 tab daily Lisinopril 2.5mg  daily Interventions:  Reviewed signs and symptoms of low blood pressure to monitor for.  Discussed blood pressure  goal Continue current medications for blood pressure Will plan to assist patient if she would like with requesting blood pressure cuff from [employer] (new for her in 2023) over-the-counter benefits once she feels a little better.     Hyperlipidemia / history of MI: Not controlled; LDL goal < 70  Patient has failed these meds in past: None noted   In September 2022 patient reported she just stopped taking atorvastatin because she was taking so many other medications. She has been taking atrovastatin regularly since.  Current therapy Atorvastatin 80mg  daily (increased 08/18/2020) Ezetimibe 10mg  daily (added 09/17/2020)  Metoprolol Succinate 25mg  1/2 tab daily Aspirin 81mg  daily  Nitroglycerin 0.4mg  - place 1 tablet under tongue as needed for chest pain, may repeat in 5 minutes if chest pain  not relieved. Interventions: (addressed at previous visit) Discussed LDL goal Discussed importance of taking atorvastatin daily to both lower cholesterol and prevent ASCVD / recurrent MI; Adherence has improved since  she started adherence packaging. Consider rechecking lipids with next labs since has not been rechecked since atorvastatin increased and ezetimibe added Reminded patient to reschedule appointment with cardiologist.   Chronic Obstructive Pulmonary Disease: Uncontrolled; current exacerbation due to upper respiratory infection. Patient was seen in ED yesterday. Prescribed prednisone, doxycycline, Augmentin and benzonatate. She has not started yet. She reports her grandson is out at the pharmacy now to get these prescriptions Patient's breathing was much better than when I spoke to her 2 days ago. She still has some wheezing and cough.  Current treatment: Albuterol solution for nebulizer - use in nebulizer up to every 6 hours as needed for shortness of breath Interventions:  Encouraged patient to start antibiotics and prednisone TODAY. Important to prevent worsening of her condition and hospitalization Consider in future if escalation in therapy is needed.  Depression/Anxiety:  Improving; goal: decrease use of diazepam and decrease number of days per month with symptoms of grief / depression Current treatment: Escitalopram 20mg  daily Diazepam 10mg  every 12 hours as needed  Patient has failed these meds in past: citalopram (ineffective) She still reports some days that she is sad when she thinks about her brother's passing. Patient has declined referral in past for grief counseling.  Patient was using diazepam frequently but today she reports that she takes usually only 1 tablet at bedtime but sometimes takes a second dose if needed.  Her refill history shows that she last filled diazepam for #10 tablets on 03/03/2021 Interventions: (addressed at previous visit) Discussed adherence  and importance of taking escitalopram for depression / anxiety Encouraged patient to continue to use diazepam sparingly - only at night if able.  Consider grief counseling (patient continues to decline)  Hypothyroidism:  Patient is currently controlled on the following medications:  levothyroxine 24mcg daily  TSH was WNL at last check on 08/18/2020 Patient has failed these meds in past: None noted  Intervention:  Requested refill of levothyroxine from [facility] current therapy   Gout:   Goal Uric Acid < 6 mg/dL   Last uric acid was not at goal (UA = 8.4 on 03/14/2020) but patient denies symptoms of gout at this time.  Current medications:  Allopurinol 100mg  - take 2 tablets = 200mg  daily Colchicine 0.6mg  up to bid as needed for acute gout Medications that may increase uric acid levels: Aspirin Last gout flare: Feb 2022 Patient has failed these meds in past: None noted   Interventions: (addressed at previous visit) Reviewed preventative versus treatment medications for gout.  Continue current therapy Consider checking Uric acid with next labs.    Medication management:   She started medication adherence packaging 01/08/2021 and really likes it. Has helped her with remembering to take her medication on time.  Noted that patient had requested prescriptions to be sent to [facility] in 2022 but her [facility] did not start until 2023. She was supposed to get 1 more deliver of packaged medication from [facility] at the end of December but did not. [facility] filled several medications 04/05/2021. After receiving delivery from [facility] and realizing that they are unable to package her medications patient has voiced that she would like to transition back to [facility].  She has received 90 days of the following medications: potassium supplement, escitalopram, atorvastatin, sucralfate, pantoprazole, dicyclomine, lisinopril, ezetimibe, metoprolol and albuterol inhaler.  Called  [employer] and reviewed medication list. The following medications were NOT filled - allopurinol, isosorbide, levothyroxine and famotidine.  Patient is very confuses about how to  take her medications. Discussed with [facility] and the soonest she can get packaged medications will be around 06/12/2021 (will plan to start new packaging 06/17/2021). I will assist patient in using weekly pill containers until she can get packaging again.It appears [employer] has filled some of her medications. .  03/24/2021: Coordinated packaging. Discussed plan to get 1 more refill from [facility] - dispense date should have been 1228/2022 however pharmacy wait Current pharmacy:  [facility] / [facility] but will transition back to [facility] Intervention:  Comprehensive medication review completed.    Coordinated with [facility] to fill allopurinol, isosorbide, levothyroxine and famotidine and deliver to patient 04/28/2021 I will meet with patient 04/29/2021 and assist her in filling weekly pill container.     Patient Goals/Self-Care Activities Over the next 90 days, patient will:  take medications as prescribed, focus on medication adherence by filing prescriptions at one pharmacy, and call if any issues with medications   Follow Up Plan:  will check back in with patient in 2 days to assist with medications         Medication Assistance: None required.  Patient affirms current coverage meets needs.  Patient's preferred pharmacy is:  [facility], [place] [address] Phone: [phone] Fax: [phone]  [facility] - [place], [place] - [address] [place] [address] Phone: [phone] Fax: [phone]   Follow Up:  Patient agrees to Care Plan and Follow-up.  Plan: follow up in 2 days (face to face - in home visit)  Cherre Robins, PharmD Clinical Pharmacist [facility] [place] [facility] [phone]

## 2021-04-28 NOTE — Patient Instructions (Signed)
Veronica Wolf It was a pleasure speaking with you today.  I have attached a summary of our visit today and information about your health goals.  Please call the care guide team at [phone] if you need to cancel or reschedule your appointment.   If you have any questions or concerns, please feel free to contact me either at the phone number below or with a MyChart message.    [name], PharmD Clinical Pharmacist [facility] [phone] (direct line)  [phone] (main office number)  CARE PLAN ENTRY  Hypertension Screening BP Readings from Last 3 Encounters:  04/21/21 (!) 108/59  02/16/21 (!) 102/56  01/09/21 (!) 143/93   Pharmacist Clinical Goal(s): Over the next 90 days, patient will work with PharmD and providers to maintain BP goal <130/80 Current regimen:  Metoprolol Succinate 25mg  0.5 tab daily Lisinopril 2.5mg  daily Interventions: Discussed blood pressure goal Discussed importance of taking metoprolol and lisinopril for heart health Patient self care activities - Over the next 90 days, patient will: Maintain blood pressure less than 130/80 Consider purchasing blood pressure cuff and check blood pressure 2 to 3 times per week and record Continue current medications    Hyperlipidemia / Heart health Lab Results  Component Value Date/Time   LDLCALC 125 (H) 08/18/2020 12:03 PM   LDLCALC 108 (H) 03/14/2020 02:39 PM   Pharmacist Clinical Goal(s): Over the next 90 days, patient will work with PharmD and providers to achieve LDL goal < 70 Current regimen:  Atorvastatin 80mg  daily  Ezetimibe 10mg  daily Aspirin 81mg  daily  Metoprolol succinate ER 25mg  - take 0.5 tablet daily Nitroglycerin 0.4mg  - place 1 tablet under the tongue as needed for chest pain, may repeat in 5 minutes if chest pain not relieved.  Interventions: Discussed LDL goal and importance of medication adherence Repeat LDL at next office visit since ezetimibe  started Discussed adherence Patient self care activities - Over the next 90 days, patient will: Continue medications currently prescribed to lower cholesterol and to prevent heart attack  Anxiety / Depression:  Pharmacist Clinical Goal(s): Over the next 45 days, patient will work with PharmD and providers to maintain control of anxiety and depression symptoms Current regimen:  Escitalopram 20mg  daily  Diazepam 10mg  take 1 tablet every 12 hours if needed for anxiety Interventions: Discussed using diazepam only if needed. Limit use during daytime if able.  Patient self care activities - Over the next 90 days, patient will: Maintain current medication regimen for anxiety and depression  Remember to take diazepam only as needed.   Gout:  Pharmacist Clinical Goal(s): Over the next 90 days, patient will work with PharmD and providers to prevent acute gout episodes / lower uric acid Current regimen:  Allopurinol 100mg  - take 2 tablets = 200mg  daily (prevention) Colchicine 0.6mg  take up to twice a day as needed for gout attack Interventions: Discussed difference between maintenance / presentation medication and treatment Patient self care activities - Over the next 90 days, patient will: Maintain current medication regimen for gout  Pre-Diabetes Lab Results  Component Value Date/Time   HGBA1C 6.5 01/09/2021 08:10 AM   HGBA1C 6.2 (H) 09/17/2020 10:38 AM   Pharmacist Clinical Goal(s): Over the next 90 days, patient will work with PharmD and providers to maintain A1c goal <6.5% Current regimen:  Diet and exercise management   Interventions: Discussed the importance of limiting carbohydrates (30-45 grams per meal) Patient self care activities - Over the next 90 days, patient will: Maintain a1c <  6.5%  Hypothyroidism Pharmacist Clinical Goal(s) Over the next 90 days, patient will work with PharmD and providers to maintain TSH within normal limits and reduce risk of symptoms associated  with hypothyroidism Current regimen:  Euthyrox / levothyroxine daily Interventions: Discussed importance of medication adherence Reminded to separate Euthyrox / levothyroxine dose by 30 minutes from breakfast and morning medications Patient self care activities - Over the next 90 days, patient will: Maintain hypothyroidism medication regimen  Medication management Pharmacist Clinical Goal(s): Over the next 90 days, patient will work with PharmD and providers to achieve optimal medication adherence Current pharmacy: [facility] Intervention:  Comprehensive medication review completed.  Coordinated packaging and refill for famotidine with pharmacy Will assist with transition back to [facility] and packaging Until patient is able to get packaging thru [facility] will assist with filling weekly pill container Patient self care activities - Over the next 90 days, patient will: Focus on medication adherence by filling and taking medications appropriately  Take medications as prescribed Report any questions or concerns to PharmD and/or provider(s) Contact [name], PharmD if any issues with medications   Care plan not printed today. Recent care plan was mailed to patient last week and since will see patient again in 2 days will provide updated plan at that time.

## 2021-04-29 ENCOUNTER — Ambulatory Visit: Payer: Medicare HMO | Admitting: Pharmacist

## 2021-04-29 ENCOUNTER — Telehealth: Payer: Medicare HMO

## 2021-04-29 DIAGNOSIS — Z79899 Other long term (current) drug therapy: Secondary | ICD-10-CM

## 2021-04-29 DIAGNOSIS — I10 Essential (primary) hypertension: Secondary | ICD-10-CM

## 2021-04-29 DIAGNOSIS — I2510 Atherosclerotic heart disease of native coronary artery without angina pectoris: Secondary | ICD-10-CM

## 2021-04-29 DIAGNOSIS — F418 Other specified anxiety disorders: Secondary | ICD-10-CM

## 2021-04-29 DIAGNOSIS — J449 Chronic obstructive pulmonary disease, unspecified: Secondary | ICD-10-CM

## 2021-04-29 DIAGNOSIS — E785 Hyperlipidemia, unspecified: Secondary | ICD-10-CM

## 2021-04-29 NOTE — Patient Instructions (Signed)
Veronica Wolf It was a pleasure speaking with you today.  I have attached a summary of our visit today and information about your health goals.   Our next appointment is  in person / home visit  on February 1st, 2023 at 8am  Please call the care guide team at [phone] if you need to cancel or reschedule your appointment.    If you have any questions or concerns, please feel free to contact me either at the phone number below or with a MyChart message.   Keep up the good work!  [name], PharmD Clinical Pharmacist [facility] [phone] (direct line)  [phone] (main office number)  CARE PLAN ENTRY (see longitudinal plan of care for additional care plan information)  Current Barriers:  Chronic Disease Management support, education, and care coordination needs related to Hyperlipidemia/Hx of MI, Pre-Diabetes, Hypothyroidism, Depression/Anxiety, GERD, Gout   Hypertension Screening BP Readings from Last 3 Encounters:  04/21/21 (!) 108/59  02/16/21 (!) 102/56  01/09/21 (!) 143/93   Pharmacist Clinical Goal(s): Over the next 90 days, patient will work with PharmD and providers to maintain BP goal <130/80 Current regimen:  Metoprolol Succinate 25mg  0.5 tab daily Lisinopril 2.5mg  daily Interventions: Discussed blood pressure goal Discussed importance of taking metoprolol and lisinopril for heart health Patient self care activities - Over the next 90 days, patient will: Maintain blood pressure less than 130/80 Consider purchasing blood pressure cuff and check blood pressure 2 to 3 times per week and record Continue current medications    Hyperlipidemia / Heart health Lab Results  Component Value Date/Time   LDLCALC 125 (H) 08/18/2020 12:03 PM   LDLCALC 108 (H) 03/14/2020 02:39 PM   Pharmacist Clinical Goal(s): Over the next 90 days, patient will work with PharmD and providers to achieve LDL goal < 70 Current regimen:   Atorvastatin 80mg  daily  Ezetimibe 10mg  daily Aspirin 81mg  daily  Metoprolol succinate ER 25mg  - take 0.5 tablet daily Nitroglycerin 0.4mg  - place 1 tablet under the tongue as needed for chest pain, may repeat in 5 minutes if chest pain not relieved.  Interventions: Discussed LDL goal and importance of medication adherence Repeat LDL at next office visit since ezetimibe started Discussed adherence Patient self care activities - Over the next 90 days, patient will: Continue medications currently prescribed to lower cholesterol and to prevent heart attack  Anxiety / Depression:  Pharmacist Clinical Goal(s): Over the next 45 days, patient will work with PharmD and providers to maintain control of anxiety and depression symptoms Current regimen:  Escitalopram 20mg  daily  Diazepam 10mg  take 1 tablet every 12 hours if needed for anxiety Interventions: Discussed using diazepam only if needed. Limit use during daytime if able.  Patient self care activities - Over the next 90 days, patient will: Maintain current medication regimen for anxiety and depression  Remember to take diazepam only as needed.   Gout:  Pharmacist Clinical Goal(s): Over the next 90 days, patient will work with PharmD and providers to prevent acute gout episodes / lower uric acid Current regimen:  Allopurinol 100mg  - take 2 tablets = 200mg  daily (prevention) Colchicine 0.6mg  take up to twice a day as needed for gout attack Interventions: Discussed difference between maintenance / presentation medication and treatment Patient self care activities - Over the next 90 days, patient will: Maintain current medication regimen for gout  Pre-Diabetes Lab Results  Component Value Date/Time   HGBA1C 6.5 01/09/2021 08:10 AM   HGBA1C 6.2 (H)  09/17/2020 10:38 AM   Pharmacist Clinical Goal(s): Over the next 90 days, patient will work with PharmD and providers to maintain A1c goal <6.5% Current regimen:  Diet and exercise  management   Interventions: Discussed the importance of limiting carbohydrates (30-45 grams per meal) Patient self care activities - Over the next 90 days, patient will: Maintain a1c <6.5%  Hypothyroidism Pharmacist Clinical Goal(s) Over the next 90 days, patient will work with PharmD and providers to maintain TSH within normal limits and reduce risk of symptoms associated with hypothyroidism Current regimen:  Euthyrox / levothyroxine daily Interventions: Discussed importance of medication adherence Reminded to separate Euthyrox / levothyroxine dose by 30 minutes from breakfast and morning medications Patient self care activities - Over the next 90 days, patient will: Maintain hypothyroidism medication regimen  Medication management Pharmacist Clinical Goal(s): Over the next 90 days, patient will work with PharmD and providers to achieve optimal medication adherence Current pharmacy: [facility] Intervention:  Comprehensive medication review completed.  Coordinated packaging and refill for famotidine with pharmacy Will assist with transition back to [facility] and packaging Until patient is able to get packaging thru [facility] will assist with filling weekly pill container Patient self care activities - Over the next 90 days, patient will: Focus on medication adherence by filling and taking medications appropriately  Take medications as prescribed Report any questions or concerns to PharmD and/or provider(s) Contact [name], PharmD if any issues with medications   The patient verbalized understanding of instructions, educational materials, and care plan provided today and agreed to receive a mailed copy of patient instructions, educational materials, and care plan.

## 2021-04-29 NOTE — Chronic Care Management (AMB) (Signed)
Chronic Care Management Pharmacy Note  04/29/2021 Name:  Veronica Wolf MRN:  [id number] DOB:  01-14-1941  Summary: She has received first delivery from [facility] and very confused about how to take her medications. Met with patient in her home today to assist in filling weekly pill containers. Will meet with her next week with 3 weekly pill containers (for total of 4 weeks) until able to restart packaging from [facility] - will be able to restart around 06/17/2021.   Subjective: Veronica Wolf is an 81 y.o. year old female who is a primary patient of Debbrah Alar, NP.  The CCM team was consulted for assistance with disease management and care coordination needs.    Engaged with patient face to face for follow up visit in response to provider referral for pharmacy case management and/or care coordination services.   Consent to Services:  The patient was given information about Chronic Care Management services, agreed to services, and gave verbal consent prior to initiation of services.  Please see initial visit note for detailed documentation.   Patient Care Team: Debbrah Alar, NP as PCP - General (Internal Medicine) Revankar, Reita Cliche, MD as PCP - Cardiology (Cardiology) Cherre Robins, [place] (Pharmacist) Luretha Rued, RN as [facility]  Recent office visits: 04/21/2021 - Interal med [facility]) Phone visit for cough, shortness of breath and wheezing. Patient encouraged to go to ED.  02/16/2021 - Phone Call  to PCP office regarding rash under her breast and stomach. Debbrah Alar sent in prescription for nystatin powder.  02/16/2021 - Internal Med [facility]) F/U anxiety and stomach pain. Referred to GI for abdominal pain. UDS ordered. BP noted to be low - no medication changes. Continue to minitor BP.  01/09/2021 - PCP Inda Castle, NP) Seen for epigatric / chest pain. Labs ordered - CBC, BMP and lipase. Also ordered CT abdomin. No med  changes noted.  12/03/2020- PCP Inda Castle, NP) Seen for back pain. Prescribed Medrol dose pack and ordered Xay. Also checked urinalysis and recommended she complete course of ciprofloxacin given while she was in [place].  09/16/2020 - PCP Phone Call. Ultrasound showed fatty liver. Recommended follow up with GI.  09/08/2020 - PCP Inda Castle) Seen for abdominal pain and diarrhea. Hemoccult negative. Reordered Abdominal CT, LFTs and lipase, CBC. No medication changes  Recent consult visits: 09/17/2020 - Cardio (Dr Harriet Masson) CAD / intermittant chest pain. Started low dose Imdue $RemoveBe'15mg'IupoinPym$  daily and ezetimibe $RemoveBefor'10mg'rsipvklOsWgD$  daily. Ordered nucular stress test   Hospital visits: 04/21/2021 ED Visit at [facility] URI leading to COPD exacerbation. Prescribed doxycycline, Augmentin, prednisone and benzonatate at discharge.    Objective:  Lab Results  Component Value Date   CREATININE 1.63 (H) 04/21/2021   CREATININE 1.40 (H) 01/09/2021   CREATININE 1.38 (H) 08/18/2020    Lab Results  Component Value Date   HGBA1C 6.5 01/09/2021   Last diabetic Eye exam: No results found for: HMDIABEYEEXA  Last diabetic Foot exam: No results found for: HMDIABFOOTEX      Component Value Date/Time   CHOL 204 (H) 08/18/2020 1203   TRIG 182.0 (H) 08/18/2020 1203   HDL 42.30 08/18/2020 1203   CHOLHDL 5 08/18/2020 1203   VLDL 36.4 08/18/2020 1203   LDLCALC 125 (H) 08/18/2020 1203   LDLCALC 108 (H) 03/14/2020 1439    Hepatic Function Latest Ref Rng & Units 04/21/2021 01/09/2021 09/08/2020  Total Protein 6.5 - 8.1 g/dL 6.6 6.7 6.3  Albumin 3.5 - 5.0 g/dL 3.1(L) 3.8  3.6  AST 15 - 41 U/L 14(L) 14 10  ALT 0 - 44 U/L $Remo'10 6 5  'VrrZw$ Alk Phosphatase 38 - 126 U/L 70 82 64  Total Bilirubin 0.3 - 1.2 mg/dL 0.5 0.3 0.4  Bilirubin, Direct 0.0 - 0.2 mg/dL 0.1 - 0.1    Lab Results  Component Value Date/Time   TSH 3.57 08/18/2020 12:03 PM   TSH 1.77 03/14/2020 02:39 PM   FREET4 0.85 01/24/2018 03:04 PM    CBC Latest Ref Rng &  Units 04/21/2021 09/08/2020 06/11/2020  WBC 4.0 - 10.5 K/uL 16.2(H) 7.3 9.1  Hemoglobin 12.0 - 15.0 g/dL 11.3(L) 11.6(L) 11.5(L)  Hematocrit 36.0 - 46.0 % 36.1 36.1 38.1  Platelets 150 - 400 K/uL 210 219.0 219    No results found for: VD25OH  Clinical ASCVD: Yes  The ASCVD Risk score (Arnett DK, et al., 2019) failed to calculate for the following reasons:   The 2019 ASCVD risk score is only valid for ages 86 to 66   The patient has a prior MI or stroke diagnosis    Other:  DEXA 03/05/2014             AP LUMBAR SPINE not used for calculation due to significant degenerative changes.             Left FOREARM (1/3 RADIUS) T Score: -0.4             Left FEMUR neck T-Score: -0.7     Social History   Tobacco Use  Smoking Status Former   Types: Cigarettes   Quit date: 04/24/2000   Years since quitting: 21.0  Smokeless Tobacco Never   BP Readings from Last 3 Encounters:  04/21/21 (!) 108/59  02/16/21 (!) 102/56  01/09/21 (!) 143/93   Pulse Readings from Last 3 Encounters:  04/21/21 76  02/16/21 82  01/09/21 88   Wt Readings from Last 3 Encounters:  04/13/21 182 lb (82.6 kg)  02/16/21 182 lb (82.6 kg)  01/09/21 189 lb (85.7 kg)    Assessment: Review of patient past medical history, allergies, medications, health status, including review of consultants reports, laboratory and other test data, was performed as part of comprehensive evaluation and provision of chronic care management services.   SDOH:  (Social Determinants of Health) assessments and interventions performed:      CCM Care Plan  Allergies  Allergen Reactions   Ibuprofen Other (See Comments)    Pt has hx of peptic ulcer and diverticulitis.     Medications Reviewed Today     Reviewed by Cherre Robins, RPH-CPP (Pharmacist) on 04/29/21 at [phone]  Med List Status: <None>   Medication Order Taking? Sig Documenting Provider Last Dose Status Informant  albuterol (PROVENTIL) (2.5 MG/3ML) 0.083% nebulizer solution  [id number] Yes USE 1 VIAL IN NEBULIZER EVERY 6 HOURS AS NEEDED FOR WHEEZING AND FOR SHORTNESS OF [name], Lenna Sciara, NP Taking Active   allopurinol (ZYLOPRIM) 100 MG tablet [id number] No Take 2 tablets (200 mg total) by mouth daily.  Patient not taking: Reported on 04/29/2021   Debbrah Alar, NP Not Taking Active   aspirin 81 MG EC tablet [id number] No Take 1 tablet (81 mg total) by mouth daily. Swallow whole.  Patient not taking: Reported on 04/29/2021   Berniece Salines, DO Not Taking Active   atorvastatin (LIPITOR) 80 MG tablet [id number] Yes Take 1 tablet (80 mg total) by mouth daily. Debbrah Alar, NP Taking Active   benzonatate (TESSALON) 100 MG capsule [id number] Yes Take 1 capsule (  100 mg total) by mouth every 8 (eight) hours. Tonye Pearson, PA-C Taking Active   colchicine 0.6 MG tablet [id number] Yes Take 0.6 mg by mouth daily as needed (gout). [provider] Taking Active   diazepam (VALIUM) 10 MG tablet [id number] Yes TAKE ONE TABLET BY MOUTH EVERY TWELVE HOURS AS NEEDED FOR ANXIETY Debbrah Alar, NP Taking Active   dicyclomine (BENTYL) 10 MG capsule [id number] Yes Take 1 capsule (10 mg total) by mouth in the morning, at noon, in the evening, and at bedtime. Debbrah Alar, NP Taking Active   escitalopram (LEXAPRO) 20 MG tablet [id number] Yes Take 1 tablet (20 mg total) by mouth daily. Debbrah Alar, NP Taking Active   ezetimibe (ZETIA) 10 MG tablet [id number] Yes Take 1 tablet (10 mg total) by mouth daily. Debbrah Alar, NP Taking Active   famotidine (PEPCID) 20 MG tablet [id number] Yes Take 1 tablet (20 mg total) by mouth 2 (two) times daily. In the morning and at bedtime Colon Branch, MD Taking Active   isosorbide mononitrate (IMDUR) 30 MG 24 hr tablet [id number] Yes Take 1 tablet (30 mg total) by mouth daily. Mosie Lukes, MD Taking Active   levothyroxine (SYNTHROID) 25 MCG tablet [id number] No TAKE ONE TABLET BY MOUTH EVERY MORNING  Patient not  taking: Reported on 04/29/2021   Debbrah Alar, NP Not Taking Active   lisinopril (ZESTRIL) 2.5 MG tablet [id number] Yes Take 1 tablet (2.5 mg total) by mouth daily. Debbrah Alar, NP Taking Active   meclizine (ANTIVERT) 25 MG tablet [id number] Yes TAKE ONE TABLET BY MOUTH twive daily AS NEEDED FOR dizziness Debbrah Alar, NP Taking Active   metoprolol succinate (TOPROL-XL) 25 MG 24 hr tablet [id number] Yes Take 0.5 tablets (12.5 mg total) by mouth daily. Debbrah Alar, NP Taking Active   nitroGLYCERIN (NITROSTAT) 0.4 MG SL tablet [id number] Yes Place 1 tablet (0.4 mg total) under the tongue every 5 (five) minutes as needed for chest pain. Berniece Salines, DO Taking Active   nystatin (MYCOSTATIN/NYSTOP) powder [id number] Yes APPLY POWDER TOPICALLY TO AFFECTED AREA 2 TIMES DAILY Debbrah Alar, NP Taking Active   pantoprazole (PROTONIX) 40 MG tablet [id number] Yes Take 1 tablet (40 mg total) by mouth 2 (two) times daily. Debbrah Alar, NP Taking Active   potassium chloride SA (KLOR-CON M) 20 MEQ tablet [id number] Yes TAKE ONE TABLET BY MOUTH AT Guerry Bruin, Lenna Sciara, NP Taking Active   sucralfate (CARAFATE) 1 g tablet [id number] Yes TAKE ONE TABLET BY MOUTH FOUR TIMES DAILY. MAY cut pill in half AND DISSOLVE in water prior TO drinking Debbrah Alar, NP Taking Active             Patient Active Problem List   Diagnosis Date Noted   Cough 04/21/2021   Left otitis media 02/16/2021   Back pain 12/03/2020   Acute cystitis without hematuria 12/03/2020   Grief reaction 12/03/2020   Fatty liver 12/01/2020   Intermittent left-sided chest pain 09/17/2020   Coronary artery disease involving native coronary artery of native heart 24/46/2863   Metabolic syndrome 81/77/1165   Prediabetes 09/17/2020   Allergy 09/16/2020   Anxiety 09/16/2020   Hypertension 09/16/2020   Vertigo 09/16/2020   Dark stools 09/08/2020   Dysphagia    Esophageal stricture    Hiatal hernia     Epigastric pain    Hypothyroid 01/25/2018   Mild CAD 10/03/2017   Stress-induced cardiomyopathy 09/19/2017   COPD exacerbation ([place]) 09/14/2017   Hypokalemia 09/14/2017   Non-ST elevation (NSTEMI) myocardial infarction (  [place]) - due to Demand Infarction 09/14/2017   CKD (chronic kidney disease) 04/04/2017   HLD (hyperlipidemia)    Allergic rhinitis    Gout 02/28/2017   Depression with anxiety 02/28/2017   Epigastric abdominal pain 12/22/2016   COPD with chronic bronchitis ([place]) 12/22/2016   GERD (gastroesophageal reflux disease) 12/22/2016   DOE (dyspnea on exertion) 12/22/2016   Peptic ulcer 01/04/2003    Immunization History  Administered Date(s) Administered   Fluad Quad(high Dose 65+) 03/14/2020, 12/03/2020   Influenza, High Dose Seasonal PF 01/23/2018, 12/07/2018   Influenza-Unspecified 12/23/2018   PFIZER(Purple Top)SARS-COV-2 Vaccination 05/14/2019, 06/08/2019   Pneumococcal Conjugate-13 07/16/2015   Pneumococcal Polysaccharide-23 12/23/2016   Tdap 09/02/2009, 04/05/2017    Conditions to be addressed/monitored: CAD, HTN, HLD, COPD, Anxiety, Depression and gout, GERD ; hypothyroidism  Care Plan : General Pharmacy (Adult)  Updates made by Cherre Robins, RPH-CPP since 04/29/2021 12:00 AM     Problem: Medication management and Monitoring and Coordination of Care for Chronic Conditions   Priority: High  Onset Date: 09/02/2020     Long-Range Goal: Medication management   Start Date: 09/02/2020  Recent Progress: On track  Priority: High  Note:    Current Barriers:  Unable to achieve control of hyperlipidemia  Does not adhere to prescribed medication regimen Does not maintain contact with provider office  Pharmacist Clinical Goal(s):  Over the next 90 days, patient will achieve adherence to monitoring guidelines and medication adherence to achieve therapeutic efficacy achieve control of hyperlipidemia as evidenced by LDL < 70 adhere to prescribed medication regimen  as evidenced by filling prescriptions on times contact provider office for questions/concerns as evidenced notation of same in electronic health record through collaboration with PharmD and provider.   Interventions: 1:1 collaboration with Debbrah Alar, NP regarding development and update of comprehensive plan of care as evidenced by provider attestation and co-signature Inter-disciplinary care team collaboration (see longitudinal plan of care) Comprehensive medication review performed; medication list updated in electronic medical record  Hypertension: Controlled though blood pressure was a little low at last office visit BP goal is:  <130/80  Patient is not checking BP at home - states she does not have BP cuff.  Patient has failed these meds in the past: None noted  Current Regimen: Metoprolol Succinate 25mg  0.5 tab daily Lisinopril 2.5mg  daily Interventions:  Reviewed signs and symptoms of low blood pressure to monitor for.  Discussed blood pressure  goal Continue current medications for blood pressure Will plan to assist patient if she would like with requesting blood pressure cuff from [facility] (new for her in 2023) over-the-counter benefits once she feels a little better.     Hyperlipidemia / history of MI: Not controlled; LDL goal < 70  Patient has failed these meds in past: None noted   In September 2022 patient reported she just stopped taking atorvastatin because she was taking so many other medications. She has been taking atrovastatin regularly since.  Current therapy Atorvastatin 80mg  daily (increased 08/18/2020) Ezetimibe 10mg  daily (added 09/17/2020)  Metoprolol Succinate 25mg  1/2 tab daily Aspirin 81mg  daily  Nitroglycerin 0.4mg  - place 1 tablet under tongue as needed for chest pain, may repeat in 5 minutes if chest pain not relieved. Interventions: (addressed at previous visit) Discussed LDL goal Discussed importance of taking atorvastatin daily to both lower  cholesterol and prevent ASCVD / recurrent MI; Adherence has improved since she started adherence packaging. Consider rechecking lipids with next labs since has not been rechecked since atorvastatin increased and  ezetimibe added Reminded patient to reschedule appointment with cardiologist.   Chronic Obstructive Pulmonary Disease: Uncontrolled; exacerbation due to upper respiratory infection 04/21/2021 Patient was seen in ED 04/21/2021 - Prescribed prednisone, doxycycline, Augmentin and benzonatate. She has 1 more day of prednisone and Augmentin. Has completed course of doxycycline.  Patient's breathing has improved greatly with antibiotic and steroid therapy. Noted no shortness of breath or wheezing during visit today.  Current treatment: Albuterol solution for nebulizer - use in nebulizer up to every 6 hours as needed for shortness of breath Interventions:  Encouraged patient to completed antibiotics and prednisone. Consider in future if escalation in therapy is needed.  Depression/Anxiety:  Improving; goal: decrease use of diazepam and decrease number of days per month with symptoms of grief / depression Current treatment: Escitalopram 20mg  daily Diazepam 10mg  every 12 hours as needed  Patient has failed these meds in past: citalopram (ineffective) Patient has been taking diazepam less recently. Only using 1 or 2 times per week when she has difficulty sleeping.  Reports her mood has been stable the last 2 months.  Interventions:  Discussed adherence and importance of taking escitalopram for depression / anxiety Encouraged patient to continue to use diazepam sparingly - only at night if needed.  Consider grief counseling (patient continues to decline)  Hypothyroidism:  Patient is currently controlled on the following medications:  levothyroxine 56mcg daily  TSH was WNL at last check on 08/18/2020 Patient has failed these meds in past: None noted  Intervention:  Continue current  therapy   Gout:   Goal Uric Acid < 6 mg/dL   Last uric acid was not at goal (UA = 8.4 on 03/14/2020) but patient denies symptoms of gout at this time.  Current medications:  Allopurinol 100mg  - take 2 tablets = 200mg  daily Colchicine 0.6mg  up to bid as needed for acute gout Medications that may increase uric acid levels: Aspirin Last gout flare: Feb 2022 Patient has failed these meds in past: None noted   Interventions: (addressed at previous visit) Reviewed preventative versus treatment medications for gout.  Continue current therapy Consider checking Uric acid with next labs.    Medication management:   She started medication adherence packaging 01/08/2021 and really liked it. Has helped her with remembering to take her medication on time. However she changed to [facility] for 2023 and thought she would try using their mailorder phamacy [facility] no realizing that [facility] would not package her medications in daily pill packs.  She received a delivery recently with 90 day supply of most maintenance medication and she states she felt overwelmed and not sure that she will be able take them as prescribed without help. She states her daughter and grandson are not able to assist.  Monday 04/27/2021 coordinated with [facility] to get the following needed med filled and delivered - allopurinol, isosorbide, levothyroxine and famotidine.   Discussed with [facility] and the soonest she can get packaged medications will be around 06/12/2021 (will plan to start new packaging 06/17/2021). I will assist patient in using weekly pill containers until she can get packaging again.It appears [employer] has filled some of her medications. .  Current pharmacy:  [employer] / [facility] but will transition back to [facility] Intervention:  Comprehensive medication review completed.    Coordinated with [facility] to fill allopurinol, isosorbide, levothyroxine and famotidine and deliver to patient 04/28/2021 Met with patient and  assisted her in filling weekly pill container. Will plan to get 3 more weekly containers and next week will be  able to fill a months worth of weekly containers for her to use until she is able to restart packaing.   Patient Goals/Self-Care Activities Over the next 90 days, patient will:  take medications as prescribed, focus on medication adherence by filing prescriptions at one pharmacy, and call if any issues with medications   Follow Up Plan:  will check back in with patient in 5 to 7 days to assist with medications         Medication Assistance: None required.  Patient affirms current coverage meets needs.  Patient's preferred pharmacy is:  [facility], [place] [address] Phone: [phone] Fax: [phone]  [facility] - [place], [place] - [address] [place] [address] Phone: [phone] Fax: [phone]   Follow Up:  Patient agrees to Care Plan and Follow-up.  Plan: follow up in 1 week (face to face - in home visit)  Cherre Robins, PharmD Clinical Pharmacist [facility] [phone]

## 2021-05-04 ENCOUNTER — Ambulatory Visit: Payer: Medicare HMO

## 2021-05-04 DIAGNOSIS — I10 Essential (primary) hypertension: Secondary | ICD-10-CM

## 2021-05-04 DIAGNOSIS — J449 Chronic obstructive pulmonary disease, unspecified: Secondary | ICD-10-CM

## 2021-05-04 NOTE — Chronic Care Management (AMB) (Signed)
Chronic Care Management   CCM RN Visit Note  05/04/2021 Name: Veronica Wolf MRN: [id number] DOB: 1941-01-19  Subjective: Veronica Wolf is a 81 y.o. year old female who is a primary care patient of Debbrah Alar, NP. The care management team was consulted for assistance with disease management and care coordination needs.    Engaged with patient by telephone for initial visit in response to provider referral for case management and/or care coordination services.   Consent to Services:  The patient was given information about Chronic Care Management services, agreed to services, and gave verbal consent prior to initiation of services.  Please see initial visit note for detailed documentation.   Patient agreed to services and verbal consent obtained.   Assessment: Review of patient past medical history, allergies, medications, health status, including review of consultants reports, laboratory and other test data, was performed as part of comprehensive evaluation and provision of chronic care management services.   SDOH (Social Determinants of Health) assessments and interventions performed:    CCM Care Plan  Allergies  Allergen Reactions   Ibuprofen Other (See Comments)    Pt has hx of peptic ulcer and diverticulitis.     Outpatient Encounter Medications as of 05/04/2021  Medication Sig   albuterol (PROVENTIL) (2.5 MG/3ML) 0.083% nebulizer solution USE 1 VIAL IN NEBULIZER EVERY 6 HOURS AS NEEDED FOR WHEEZING AND FOR SHORTNESS OF BREATH   allopurinol (ZYLOPRIM) 100 MG tablet Take 2 tablets (200 mg total) by mouth daily. (Patient not taking: Reported on 04/29/2021)   aspirin 81 MG EC tablet Take 1 tablet (81 mg total) by mouth daily. Swallow whole. (Patient not taking: Reported on 04/29/2021)   atorvastatin (LIPITOR) 80 MG tablet Take 1 tablet (80 mg total) by mouth daily.   benzonatate (TESSALON) 100 MG capsule Take 1 capsule (100 mg total) by mouth every 8 (eight) hours.    colchicine 0.6 MG tablet Take 0.6 mg by mouth daily as needed (gout).   diazepam (VALIUM) 10 MG tablet TAKE ONE TABLET BY MOUTH EVERY TWELVE HOURS AS NEEDED FOR ANXIETY   dicyclomine (BENTYL) 10 MG capsule Take 1 capsule (10 mg total) by mouth in the morning, at noon, in the evening, and at bedtime.   escitalopram (LEXAPRO) 20 MG tablet Take 1 tablet (20 mg total) by mouth daily.   ezetimibe (ZETIA) 10 MG tablet Take 1 tablet (10 mg total) by mouth daily.   famotidine (PEPCID) 20 MG tablet Take 1 tablet (20 mg total) by mouth 2 (two) times daily. In the morning and at bedtime   isosorbide mononitrate (IMDUR) 30 MG 24 hr tablet Take 1 tablet (30 mg total) by mouth daily.   levothyroxine (SYNTHROID) 25 MCG tablet TAKE ONE TABLET BY MOUTH EVERY MORNING (Patient not taking: Reported on 04/29/2021)   lisinopril (ZESTRIL) 2.5 MG tablet Take 1 tablet (2.5 mg total) by mouth daily.   meclizine (ANTIVERT) 25 MG tablet TAKE ONE TABLET BY MOUTH twive daily AS NEEDED FOR dizziness   metoprolol succinate (TOPROL-XL) 25 MG 24 hr tablet Take 0.5 tablets (12.5 mg total) by mouth daily.   nitroGLYCERIN (NITROSTAT) 0.4 MG SL tablet Place 1 tablet (0.4 mg total) under the tongue every 5 (five) minutes as needed for chest pain.   nystatin (MYCOSTATIN/NYSTOP) powder APPLY POWDER TOPICALLY TO AFFECTED AREA 2 TIMES DAILY   pantoprazole (PROTONIX) 40 MG tablet Take 1 tablet (40 mg total) by mouth 2 (two) times daily.   potassium chloride SA (KLOR-CON M) 20 MEQ  tablet TAKE ONE TABLET BY MOUTH AT NOON   sucralfate (CARAFATE) 1 g tablet TAKE ONE TABLET BY MOUTH FOUR TIMES DAILY. MAY cut pill in half AND DISSOLVE in water prior TO drinking   No facility-administered encounter medications on file as of 05/04/2021.    Patient Active Problem List   Diagnosis Date Noted   Cough 04/21/2021   Left otitis media 02/16/2021   Back pain 12/03/2020   Acute cystitis without hematuria 12/03/2020   Grief reaction 12/03/2020   Fatty  liver 12/01/2020   Intermittent left-sided chest pain 09/17/2020   Coronary artery disease involving native coronary artery of native heart 79/39/0300   Metabolic syndrome 92/33/0076   Prediabetes 09/17/2020   Allergy 09/16/2020   Anxiety 09/16/2020   Hypertension 09/16/2020   Vertigo 09/16/2020   Dark stools 09/08/2020   Dysphagia    Esophageal stricture    Hiatal hernia    Epigastric pain    Hypothyroid 01/25/2018   Mild CAD 10/03/2017   Stress-induced cardiomyopathy 09/19/2017   COPD exacerbation ([place]) 09/14/2017   Hypokalemia 09/14/2017   Non-ST elevation (NSTEMI) myocardial infarction [facility]) - due to Demand Infarction 09/14/2017   CKD (chronic kidney disease) 04/04/2017   HLD (hyperlipidemia)    Allergic rhinitis    Gout 02/28/2017   Depression with anxiety 02/28/2017   Epigastric abdominal pain 12/22/2016   COPD with chronic bronchitis ([place]) 12/22/2016   GERD (gastroesophageal reflux disease) 12/22/2016   DOE (dyspnea on exertion) 12/22/2016   Peptic ulcer 01/04/2003    Conditions to be addressed/monitored:HTN and COPD  Care Plan : COPD (Adult)  Updates made by Luretha Rued, RN since 05/04/2021 12:00 AM  Completed 05/04/2021   Care Plan : RN Care Manager Plan of Care  Updates made by Luretha Rued, RN since 05/04/2021 12:00 AM  Completed 05/04/2021   Care Plan : RN Care Manager Plan of Care  Updates made by Luretha Rued, RN since 05/04/2021 12:00 AM     Problem: Chronic disease Managment education and/or care coordination needs   Priority: High     Long-Range Goal: Development of Plan of Care for Chronic Disease Managment and/or care coordination needs   Start Date: 05/04/2021  Expected End Date: 11/01/2021  Priority: High  Note:   Current Barriers: Veronica Wolf reports she lives in her own home- grandson's live with her as well as 91 yr old and 61 year old great grandchildren. She reports she is not responsible for caring for them. But she acknowledges  limited support from grandsons adding they also work. Per Veronica Wolf, She has a daughter who lives in [place] who is supportive. Veronica Wolf states she has food in the home and cooks her own meals. Her daughter assist her in grocery shopping and transportation to appointments. However, Veronica Wolf reports support is limited due to daughter's own health issues. And Veronica Wolf states she does not like to ask for help.  She acknowledges some forgetfulness and is very appreciative of assistance from clinical pharmacist organizing medications and assisting with medication management.  Veronica Wolf expresses's she is used to being independent and social. She would like to be more involved in social groups/activities. She also reports the loss of her brother about 2 weeks and acknowledges that she is grieving. She is receptive to referral to embedded clinical Education officer, museum. Medications last reviewed on 04/29/21 with clinical pharmacist and upcoming appointment schedule with clinical pharmacist on 05/06/21. RNCM did not review medications with Veronica Wolf in order  to minimize confusion. ED visit 04/21/21 due to COPD exacerbation. She denies any signs/symptoms a this time. Care Coordination needs related to Limited social support and Lacks knowledge of community resource: grieving loss of her brother  Chronic Disease Management support and education needs related to HTN and COPD Medication management/adherence-clinical pharmacist involved  RNCM Clinical Goal(s):  Patient will verbalize understanding of plan for management of HTN and COPD as evidenced by self report and/or chart notatation demonstrate improved adherence to prescribed treatment plan for HTN and COPD as evidenced by self report and/or chart notation continue to work with Consulting civil engineer and/or Social Worker to address care management and care coordination needs related to HTN and COPD as evidenced by adherence to CM Team Scheduled appointments     work with  [employer] care guide to address needs related to Limited social support, Transport planner, and [employer] knowledge of community resource: report knowledge of Secondary school teacher as evidenced by patient and/or community resource care guide support    through collaboration with Consulting civil engineer, provider, and care team.   Interventions: 1:1 collaboration with primary care provider regarding development and update of comprehensive plan of care as evidenced by provider attestation and co-signature Inter-disciplinary care team collaboration (see longitudinal plan of care) Evaluation of current treatment plan related to  self management and patient's adherence to plan as established by provider LCSW referral regarding recent loss of her brother, loss of independence, decrease in social activities Care Guide referral regarding community resources for social engagement  COPD Interventions:  (Status:  New goal.) Long Term Goal Provided patient with basic written and verbal COPD education on self care/management/and exacerbation prevention Encouraged to continue to take medications as prescribed Encouraged to attend provider visits as scheduled Reviewed COPD action plan emphasizing green zone and yellow zone-knowing when to call the doctor.  Hypertension Interventions:  (Status:  New goal.) Long Term Goal Last practice recorded BP readings:  BP Readings from Last 3 Encounters:  04/21/21 (!) 108/59  02/16/21 (!) 102/56  01/09/21 (!) 143/93  Most recent eGFR/CrCl: No results found for: EGFR  No components found for: CRCL  Reviewed scheduled/upcoming provider appointments including:  Discussed medication adherence-Veronica Wolf states she is very [employer] manager. She states it is helping her manage her medications.  Discussed options for obtaining blood pressure monitor: local pharmacy, Insurance OTC benefit. Veronica Wolf states she is going to ask her daughter to  assist her with obtaining a blood pressure monitor as well as scheduling an eye exam.    Patient Goals/Self-Care Activities: Take medications as prescribed   Attend all scheduled provider appointments Keep track of how you are feeling and any symptoms. Call provider office for new Health concerns or questions Obtain a blood pressure monitor and begin checking your blood pressure Call your eye doctor and schedule an EYE EXAM Call your nurse care coordinator with for care management and/or care coordination needs   Plan:Telephone follow up appointment with care management team member scheduled for:  05/19/21 The patient has been provided with contact information for the care management team and has been advised to call with any health related questions or concerns.   Thea Silversmith, RN, MSN, BSN, CCM Care Management Coordinator [facility] [phone]

## 2021-05-04 NOTE — Patient Instructions (Addendum)
Visit Information  Thank you for taking time to visit with me today. Please don't hesitate to contact me if I can be of assistance to you before our next scheduled telephone appointment.  Following are the goals we discussed today:  Patient Goals/Self-Care Activities: Take medications as prescribed   Attend all scheduled provider appointments Keep track of how you are feeling and any symptoms. Call provider office for new Health concerns or questions Obtain a blood pressure monitor and begin checking your blood pressure Call your eye doctor and schedule an EYE EXAM Call your nurse care coordinator with for care management and/or care coordination needs  Our next appointment is by telephone on 05/19/21 at 2:00 pm  Please call the care guide team at [phone] if you need to cancel or reschedule your appointment.   If you are experiencing a Mental Health or Behavioral Health Crisis or need someone to talk to, please call the Suicide and Crisis Lifeline: 988 call 1-800-273-TALK (toll free, 24 hour hotline)   The patient verbalized understanding of instructions, educational materials, and care plan provided today and agreed to receive a mailed copy of patient instructions, educational materials, and care plan.   Veronica Sheriff, RN, MSN, BSN, CCM Care Management Coordinator [facility] [phone]   COPD Action Plan A COPD action plan is a description of what to do when you have a flare (exacerbation) of chronic obstructive pulmonary disease (COPD). Your action plan is a color-coded plan that lists the symptoms that indicate whether your condition is under control and what actions to take. If you have symptoms in the green zone, it means you are doing well that day. If you have symptoms in the yellow zone, it means you are having a bad day or an exacerbation. If you have symptoms in the red zone, you need urgent medical care. Follow the plan that you and your health care  provider developed. Review your plan with your health care provider at each visit. Red zone Symptoms in this zone mean that you should get medical help right away. They include: Feeling very short of breath, even when you are resting. Not being able to do any activities because of poor breathing. Not being able to sleep because of poor breathing. Fever or shaking chills. Feeling confused or very sleepy. Chest pain. Coughing up blood. If you have any of these symptoms, call emergency services (911 in the [place] or go to the nearest emergency room. Yellow zone Symptoms in this zone mean that your condition may be getting worse. They include: Feeling more short of breath than usual. Having less energy for daily activities than usual. Phlegm or mucus that is thicker than usual. Needing to use your rescue inhaler or nebulizer more often than usual. More ankle swelling than usual. Coughing more than usual. Feeling like you have a chest cold. Trouble sleeping due to COPD symptoms. Decreased appetite. COPD medicines not helping as much as usual. If you experience any "yellow" symptoms: Keep taking your daily medicines as directed. Use your quick-relief inhaler as told by your health care provider. If you were prescribed steroid medicine to take by mouth (oral medicine), start taking it as told by your health care provider. If you were prescribed an antibiotic medicine, start taking it as told by your health care provider. Do not stop taking the antibiotic even if you start to feel better. Use oxygen as told by your health care provider. Get more rest. Do your pursed-lip breathing exercises. Do  not smoke. Avoid any irritants in the air. If your signs and symptoms do not improve after taking these steps, call your health care provider right away. Green zone Symptoms in this zone mean that you are doing well. They include: Being able to do your usual activities and exercise. Having the usual  amount of coughing, including the same amount of phlegm or mucus. Being able to sleep well. Having a good appetite. Where to find more information: You can find more information about COPD from: American Lung Association, My COPD Action Plan: www.lung.org COPD Foundation: www.copdfoundation.org National Heart, Lung, & Blood Institute: [employer] Follow these instructions at home: Continue taking your daily medicines as told by your health care provider. Make sure you receive all the immunizations that your health care provider recommends, especially the pneumococcal and influenza vaccines. Wash your hands often with soap and water. Have family members wash their hands too. Regular hand washing can help prevent infections. Follow your usual exercise and diet plan. Avoid irritants in the air, such as smoke. Do not use any products that contain nicotine or tobacco. These products include cigarettes, chewing tobacco, and vaping devices, such as e-cigarettes. If you need help quitting, ask your health care provider. Summary A COPD action plan tells you what to do when you have a flare (exacerbation) of chronic obstructive pulmonary disease (COPD). Follow each action plan for your symptoms. If you have any symptoms in the red zone, call emergency services (911 in the [place] or go to the nearest emergency room. This information is not intended to replace advice given to you by your health care provider. Make sure you discuss any questions you have with your health care provider. Document Revised: 01/29/2020 Document Reviewed: 01/29/2020 Elsevier Patient Education  2022 [employer].

## 2021-05-05 ENCOUNTER — Telehealth: Payer: Self-pay | Admitting: *Deleted

## 2021-05-05 ENCOUNTER — Other Ambulatory Visit: Payer: Self-pay | Admitting: Family

## 2021-05-05 DIAGNOSIS — F418 Other specified anxiety disorders: Secondary | ICD-10-CM | POA: Diagnosis not present

## 2021-05-05 DIAGNOSIS — I2510 Atherosclerotic heart disease of native coronary artery without angina pectoris: Secondary | ICD-10-CM

## 2021-05-05 DIAGNOSIS — E785 Hyperlipidemia, unspecified: Secondary | ICD-10-CM | POA: Diagnosis not present

## 2021-05-05 DIAGNOSIS — J449 Chronic obstructive pulmonary disease, unspecified: Secondary | ICD-10-CM

## 2021-05-05 DIAGNOSIS — I10 Essential (primary) hypertension: Secondary | ICD-10-CM

## 2021-05-05 NOTE — Telephone Encounter (Signed)
Requesting: diazepam 10mg   Contract: 03/14/2020 UDS: 03/14/2020 Last Visit: 04/21/2021 Next Visit: None  Last Refill: 04/07/2021 # 45 and 0RF  Please Advise

## 2021-05-05 NOTE — Chronic Care Management (AMB) (Signed)
°  Chronic Care Management   Note  05/05/2021 Name: HILIANA EILTS MRN: [id number] DOB: 1941-04-01  MIYO AINA is a 81 y.o. year old female who is a primary care patient of [name], NP. ALIANNY TOELLE is currently enrolled in care management services. An additional referral for Licensed Clinical SW was placed.   Follow up plan: Telephone appointment with care management team member scheduled for: 05/13/2021  Burman Nieves, CCMA Care Guide, Embedded Care Coordination [facility]   Care Management  Direct Dial: [phone]

## 2021-05-06 ENCOUNTER — Ambulatory Visit (INDEPENDENT_AMBULATORY_CARE_PROVIDER_SITE_OTHER): Payer: Medicare HMO | Admitting: Pharmacist

## 2021-05-06 ENCOUNTER — Telehealth: Payer: Medicare HMO

## 2021-05-06 DIAGNOSIS — E785 Hyperlipidemia, unspecified: Secondary | ICD-10-CM

## 2021-05-06 DIAGNOSIS — F418 Other specified anxiety disorders: Secondary | ICD-10-CM

## 2021-05-06 DIAGNOSIS — Z79899 Other long term (current) drug therapy: Secondary | ICD-10-CM

## 2021-05-06 DIAGNOSIS — I10 Essential (primary) hypertension: Secondary | ICD-10-CM

## 2021-05-07 NOTE — Chronic Care Management (AMB) (Signed)
Chronic Care Management Pharmacy Note  05/07/2021 Name:  Veronica Wolf MRN:  [id number] DOB:  [birth year]  Summary: She has received first delivery from [facility] and very confused about how to take her medications. Met with patient in her home today to assist in filling weekly pill containers for 4 weeks.  Will meet with her again 05/27/2021 to fill pill containers again that will get her through until able to restart packaging from [facility] - will be able to restart around 06/17/2021.   Subjective: Veronica Wolf is an 81 y.o. year old female who is a primary patient of Debbrah Alar, NP.  The CCM team was consulted for assistance with disease management and care coordination needs.    Engaged with patient face to face for follow up visit in response to provider referral for pharmacy case management and/or care coordination services.   Consent to Services:  The patient was given information about Chronic Care Management services, agreed to services, and gave verbal consent prior to initiation of services.  Please see initial visit note for detailed documentation.   Patient Care Team: Debbrah Alar, NP as PCP - General (Internal Medicine) Revankar, Reita Cliche, MD as PCP - Cardiology (Cardiology) Cherre Robins, [place] (Pharmacist) Luretha Rued, RN as Case Manager [place]aporito, Maree Erie, LCSW as [place] Management  Recent office visits: 04/21/2021 - Internal med Inda Castle) Phone visit for cough, shortness of breath and wheezing. Patient encouraged to go to ED.  02/16/2021 - Phone Call  to PCP office regarding rash under her breast and stomach. Debbrah Alar sent in prescription for nystatin powder.  02/16/2021 - Internal Med Inda Castle) F/U anxiety and stomach pain. Referred to GI for abdominal pain. UDS ordered. BP noted to be low - no medication changes. Continue to minitor BP.  01/09/2021 - PCP Inda Castle, NP) Seen for epigatric / chest pain. Labs  ordered - CBC, BMP and lipase. Also ordered CT abdomin. No med changes noted.  12/03/2020- PCP Inda Castle, NP) Seen for back pain. Prescribed Medrol dose pack and ordered Xay. Also checked urinalysis and recommended she complete course of ciprofloxacin given while she was in [place].  09/16/2020 - PCP Phone Call. Ultrasound showed fatty liver. Recommended follow up with GI.  09/08/2020 - PCP Inda Castle) Seen for abdominal pain and diarrhea. Hemoccult negative. Reordered Abdominal CT, LFTs and lipase, CBC. No medication changes  Recent consult visits: 09/17/2020 - Cardio (Dr Harriet Masson) CAD / intermittant chest pain. Started low dose Imdue 13m daily and ezetimibe 13mdaily. Ordered nucular stress test   Hospital visits: [facility]. Prescribed doxycycline, Augmentin, prednisone and benzonatate at discharge.    Objective:  Lab Results  Component Value Date   CREATININE 1.63 (H) 04/21/2021   CREATININE 1.40 (H) 01/09/2021   CREATININE 1.38 (H) 08/18/2020    Lab Results  Component Value Date   HGBA1C 6.5 01/09/2021   Last diabetic Eye exam: No results found for: HMDIABEYEEXA  Last diabetic Foot exam: No results found for: HMDIABFOOTEX      Component Value Date/Time   CHOL 204 (H) 08/18/2020 1203   TRIG 182.0 (H) 08/18/2020 1203   HDL 42.30 08/18/2020 1203   CHOLHDL 5 08/18/2020 1203   VLDL 36.4 08/18/2020 1203   LDLCALC 125 (H) 08/18/2020 1203   LDLCALC 108 (H) 03/14/2020 1439    Hepatic Function Latest Ref Rng & Units 04/21/2021 01/09/2021 09/08/2020  Total Protein 6.5 - 8.1  g/dL 6.6 6.7 6.3  Albumin 3.5 - 5.0 g/dL 3.1(L) 3.8 3.6  AST 15 - 41 U/L 14(L) 14 10  ALT 0 - 44 U/L _0 Alk Phosphatase 38 - 126 U/L 70 82 64  Total Bilirubin 0.3 - 1.2 mg/dL 0.5 0.3 0.4  Bilirubin, Direct 0.0 - 0.2 mg/dL 0.1 - 0.1    Lab Results  Component Value Date/Time   TSH 3.57 08/18/2020 12:03 PM   TSH 1.77 03/14/2020 02:39 PM    FREET4 0.85 01/24/2018 03:04 PM    CBC Latest Ref Rng & Units 04/21/2021 09/08/2020 06/11/2020  WBC 4.0 - 10.5 K/uL 16.2(H) 7.3 9.1  Hemoglobin 12.0 - 15.0 g/dL 11.3(L) 11.6(L) 11.5(L)  Hematocrit 36.0 - 46.0 % 36.1 36.1 38.1  Platelets 150 - 400 K/uL 210 219.0 219    No results found for: VD25OH  Clinical ASCVD: Yes  The ASCVD Risk score (Arnett DK, et al., 2019) failed to calculate for the following reasons:   The 2019 ASCVD risk score is only valid for ages 51 to 26   The patient has a prior MI or stroke diagnosis    Other:  DEXA 03/05/2014             AP LUMBAR SPINE not used for calculation due to significant degenerative changes.             Left FOREARM (1/3 RADIUS) T Score: -0.4             Left FEMUR neck T-Score: -0.7     Social History   Tobacco Use  Smoking Status Former   Types: Cigarettes   Quit date: 04/24/2000   Years since quitting: 21.0  Smokeless Tobacco Never   BP Readings from Last 3 Encounters:  04/21/21 (!) 108/59  02/16/21 (!) 102/56  01/09/21 (!) 143/93   Pulse Readings from Last 3 Encounters:  04/21/21 76  02/16/21 82  01/09/21 88   Wt Readings from Last 3 Encounters:  04/13/21 182 lb (82.6 kg)  02/16/21 182 lb (82.6 kg)  01/09/21 189 lb (85.7 kg)    Assessment: Review of patient past medical history, allergies, medications, health status, including review of consultants reports, laboratory and other test data, was performed as part of comprehensive evaluation and provision of chronic care management services.   SDOH:  (Social Determinants of Health) assessments and interventions performed:      CCM Care Plan  Allergies  Allergen Reactions   Ibuprofen Other (See Comments)    Pt has hx of peptic ulcer and diverticulitis.     Medications Reviewed Today     Reviewed by Cherre Robins, RPH-CPP (Pharmacist) on 05/07/21 at [place] List Status: <None>   Medication Order Taking? Sig Documenting Provider Last Dose Status Informant   albuterol (PROVENTIL) (2.5 MG/3ML) 0.083% nebulizer solution [id number] Yes USE 1 VIAL IN NEBULIZER EVERY 6 HOURS AS NEEDED FOR WHEEZING AND FOR SHORTNESS OF [name], Lenna Sciara, NP Taking Active   allopurinol (ZYLOPRIM) 100 MG tablet [id number] Yes Take 2 tablets (200 mg total) by mouth daily. Debbrah Alar, NP Taking Active   aspirin 81 MG EC tablet [id number] No Take 1 tablet (81 mg total) by mouth daily. Swallow whole.  Patient not taking: Reported on 05/07/2021   Berniece Salines, DO Not Taking Active            Med Note Antony Contras, [facility]   Thu May 07, 2021  8:01 AM) Patient has ran out but will purchase OTC  atorvastatin (LIPITOR) 80 MG tablet [id number] Yes Take 1 tablet (80 mg total) by mouth daily. Debbrah Alar, NP Taking Active   benzonatate (TESSALON) 100 MG capsule [id number] No Take 1 capsule (100 mg total) by mouth every 8 (eight) hours.  Patient not taking: Reported on 05/07/2021   Rodena Piety Not Taking Active   colchicine 0.6 MG tablet [id number] No Take 0.6 mg by mouth daily as needed (gout).  Patient not taking: Reported on 05/07/2021   [provider] Not Taking Active   diazepam (VALIUM) 10 MG tablet [id number] Yes TAKE ONE TABLET BY MOUTH EVERY TWELVE HOURS AS NEEDED FOR ANXIETY Debbrah Alar, NP Taking Active   dicyclomine (BENTYL) 10 MG capsule [id number] Yes Take 1 capsule (10 mg total) by mouth in the morning, at noon, in the evening, and at bedtime. Debbrah Alar, NP Taking Active   escitalopram (LEXAPRO) 20 MG tablet [id number] Yes Take 1 tablet (20 mg total) by mouth daily. Debbrah Alar, NP Taking Active   ezetimibe (ZETIA) 10 MG tablet [id number] Yes Take 1 tablet (10 mg total) by mouth daily. Debbrah Alar, NP Taking Active   famotidine (PEPCID) 20 MG tablet [id number] Yes Take 1 tablet (20 mg total) by mouth 2 (two) times daily. In the morning and at bedtime Colon Branch, MD Taking Active   isosorbide mononitrate (IMDUR) 30  MG 24 hr tablet [id number] Yes Take 1 tablet (30 mg total) by mouth daily. Mosie Lukes, MD Taking Active   levothyroxine (SYNTHROID) 25 MCG tablet [id number] Yes TAKE ONE TABLET BY MOUTH EVERY MORNING Debbrah Alar, NP Taking Active   lisinopril (ZESTRIL) 2.5 MG tablet [id number] Yes Take 1 tablet (2.5 mg total) by mouth daily. Debbrah Alar, NP Taking Active   meclizine (ANTIVERT) 25 MG tablet [id number] Yes TAKE ONE TABLET BY MOUTH twive daily AS NEEDED FOR dizziness Debbrah Alar, NP Taking Active   metoprolol succinate (TOPROL-XL) 25 MG 24 hr tablet [id number] Yes Take 0.5 tablets (12.5 mg total) by mouth daily. Debbrah Alar, NP Taking Active   nitroGLYCERIN (NITROSTAT) 0.4 MG SL tablet [id number] Yes Place 1 tablet (0.4 mg total) under the tongue every 5 (five) minutes as needed for chest pain. Berniece Salines, DO Taking Active   nystatin (MYCOSTATIN/NYSTOP) powder [id number] Yes APPLY POWDER TOPICALLY TO AFFECTED AREA 2 TIMES DAILY Debbrah Alar, NP Taking Active   pantoprazole (PROTONIX) 40 MG tablet [id number] Yes Take 1 tablet (40 mg total) by mouth 2 (two) times daily. Debbrah Alar, NP Taking Active   potassium chloride SA (KLOR-CON M) 20 MEQ tablet [id number] Yes TAKE ONE TABLET BY MOUTH AT [place] Bruin, Lenna Sciara, NP Taking Active   sucralfate (CARAFATE) 1 g tablet [id number] Yes TAKE ONE TABLET BY MOUTH FOUR TIMES DAILY. MAY cut pill in half AND DISSOLVE in water prior TO drinking Debbrah Alar, NP Taking Active             Patient Active Problem List   Diagnosis Date Noted   Cough 04/21/2021   Left otitis media 02/16/2021   Back pain 12/03/2020   Acute cystitis without hematuria 12/03/2020   Grief reaction 12/03/2020   Fatty liver 12/01/2020   Intermittent left-sided chest pain 09/17/2020   Coronary artery disease involving native coronary artery of native heart 85/05/7739   Metabolic syndrome 28/78/6767   Prediabetes 09/17/2020   Allergy  09/16/2020   Anxiety 09/16/2020   Hypertension 09/16/2020   Vertigo 09/16/2020   Dark stools 09/08/2020   Dysphagia    Esophageal  stricture    Hiatal hernia    Epigastric pain    Hypothyroid 01/25/2018   Mild CAD 10/03/2017   Stress-induced cardiomyopathy 09/19/2017   COPD exacerbation ([place]) 09/14/2017   Hypokalemia 09/14/2017   Non-ST elevation (NSTEMI) myocardial infarction [facility]) - due to Demand Infarction 09/14/2017   CKD (chronic kidney disease) 04/04/2017   HLD (hyperlipidemia)    Allergic rhinitis    Gout 02/28/2017   Depression with anxiety 02/28/2017   Epigastric abdominal pain 12/22/2016   COPD with chronic bronchitis ([place]) 12/22/2016   GERD (gastroesophageal reflux disease) 12/22/2016   DOE (dyspnea on exertion) 12/22/2016   Peptic ulcer 01/04/2003    Immunization History  Administered Date(s) Administered   Fluad Quad(high Dose 65+) 03/14/2020, 12/03/2020   Influenza, High Dose Seasonal PF 01/23/2018, 12/07/2018   Influenza-Unspecified 12/23/2018   PFIZER(Purple Top)SARS-COV-2 Vaccination 05/14/2019, 06/08/2019   Pneumococcal Conjugate-13 07/16/2015   Pneumococcal Polysaccharide-23 12/23/2016   Tdap 09/02/2009, 04/05/2017    Conditions to be addressed/monitored: CAD, HTN, HLD, COPD, Anxiety, Depression and gout, GERD ; hypothyroidism  Care Plan : General Pharmacy (Adult)  Updates made by Cherre Robins, RPH-CPP since 05/07/2021 12:00 AM     Problem: Medication management and Monitoring and Coordination of Care for Chronic Conditions   Priority: High  Onset Date: 09/02/2020     Long-Range Goal: Medication management   Start Date: 09/02/2020  This Visit's Progress: On track  Recent Progress: On track  Priority: High  Note:    Current Barriers:  Unable to achieve control of hyperlipidemia  Does not adhere to prescribed medication regimen Does not maintain contact with provider office  Pharmacist Clinical Goal(s):  Over the next 90 days, patient will  achieve adherence to monitoring guidelines and medication adherence to achieve therapeutic efficacy achieve control of hyperlipidemia as evidenced by LDL < 70 adhere to prescribed medication regimen as evidenced by filling prescriptions on times contact provider office for questions/concerns as evidenced notation of same in electronic health record through collaboration with PharmD and provider.   Interventions: 1:1 collaboration with Debbrah Alar, NP regarding development and update of comprehensive plan of care as evidenced by provider attestation and co-signature Inter-disciplinary care team collaboration (see longitudinal plan of care) Comprehensive medication review performed; medication list updated in electronic medical record  Hypertension: Controlled though blood pressure was a little low at last office visit BP goal is:  <130/80  Patient is not checking BP at home - states she does not have BP cuff.  Patient has failed these meds in the past: None noted  Current Regimen: Metoprolol Succinate 37m 0.5 tab daily Lisinopril 2.540mdaily Interventions:  Reviewed signs and symptoms of low blood pressure to monitor for.  Discussed blood pressure  goal Continue current medications for blood pressure    Hyperlipidemia / history of MI: Not controlled; LDL goal < 70  Patient has failed these meds in past: None noted   In September 2022 patient reported she just stopped taking atorvastatin because she was taking so many other medications. She has been taking atrovastatin regularly since.  Current therapy Atorvastatin 8070maily (increased 08/18/2020) Ezetimibe 57m57mily (added 09/17/2020)  Metoprolol Succinate 25mg55m tab daily Aspirin 81mg 35my  Nitroglycerin 0.4mg - 101mce 1 tablet under tongue as needed for chest pain, may repeat in 5 minutes if chest pain not relieved. Interventions: (addressed at previous visit) Discussed LDL goal Discussed importance of taking  atorvastatin daily to both lower cholesterol and prevent ASCVD / recurrent MI; Adherence has improved since  she started adherence packaging. Consider rechecking lipids with next labs since has not been rechecked since atorvastatin increased and ezetimibe added Reminded patient to reschedule appointment with cardiologist.   Chronic Obstructive Pulmonary Disease: Uncontrolled; exacerbation due to upper respiratory infection 04/21/2021 Patient was seen in ED 04/21/2021 - Prescribed prednisone, doxycycline, Augmentin and benzonatate. She has 1 more day of prednisone and Augmentin. Has completed course of doxycycline.  Patient's breathing has improved greatly with antibiotic and steroid therapy. Noted no shortness of breath or wheezing during visit today.  Current treatment: Albuterol solution for nebulizer - use in nebulizer up to every 6 hours as needed for shortness of breath Interventions:  Encouraged patient to completed antibiotics and prednisone. Consider in future if escalation in therapy is needed.  Depression/Anxiety:  Improving; goal: decrease use of diazepam and decrease number of days per month with symptoms of grief / depression Current treatment: Escitalopram 72m daily Diazepam 166mevery 12 hours as needed  Patient has failed these meds in past: citalopram (ineffective) Patient has been taking diazepam less recently. Only using 1 or 2 times per week when she has difficulty sleeping.  Reports her mood has been stable the last 2 months. She still grieves the loss of her brother in August 2022 but states grief has improved over the last 3 to 4 months. Her brother was like a father to her.  Interventions:  Discussed adherence and importance of taking escitalopram for depression / anxiety Encouraged patient to continue to use diazepam sparingly - only at night if needed.  Consider grief counseling (patient continues to decline). Patient will begin consults with Care Coordination  LCSW  Hypothyroidism:  Patient is currently controlled on the following medications:  levothyroxine 2537mdaily  TSH was WNL at last check on 08/18/2020 Patient has failed these meds in past: None noted  Intervention:  Continue current therapy   Gout:   Goal Uric Acid < 6 mg/dL   Last uric acid was not at goal (UA = 8.4 on 03/14/2020) but patient denies symptoms of gout at this time.  Current medications:  Allopurinol 100m49mtake 2 tablets = 200mg6mly Colchicine 0.6mg u38mo bid as needed for acute gout Medications that may increase uric acid levels: Aspirin Last gout flare: Feb 2022 Patient has failed these meds in past: None noted   Interventions: (addressed at previous visit) Reviewed preventative versus treatment medications for gout.  Continue current therapy Consider checking Uric acid with next labs.    Medication management:   She started medication adherence packaging 01/08/2021 and really liked it. Has helped her with remembering to take her medication on time. However [facility] and thought she would try using their mailorder phamacy [facility] no realizing that [facility] not package her medications in daily pill packs.  She received a delivery recently with 90 day supply of most maintenance medication and she states she felt overwelmed and not sure that she will be able take them as prescribed without help. She states her daughter and grandson are not able to assist.  Monday 04/27/2021 coordinated with [facility] to get the following needed med filled and delivered - allopurinol, isosorbide, levothyroxine and famotidine.   Discussed with [facility] and the soonest she can get packaged medications will be around 06/12/2021 (will plan to start new packaging 06/17/2021). I will assist patient in using weekly pill containers until she can get packaging again.It appears [facility] has filled some of her medications. .  Current pharmacy:  [facility] but  will transition back to [facility] Intervention:  Comprehensive medication review completed.    Coordinated with [facility] to fill allopurinol, isosorbide, levothyroxine and famotidine and deliver to patient 04/28/2021 Met with patient and assisted her in filling weekly pill container for 4 weeks today.  Will need 1 more visit at the end of February to fill again to get her through until March when adherence packaging can begin again.   Patient Goals/Self-Care Activities Over the next 90 days, patient will:  take medications as prescribed, focus on medication adherence by filing prescriptions at one pharmacy, and call if any issues with medications   Follow Up Plan:  follow up appt face to face for medication management scheduled for 05/27/2021         Medication Assistance: None required.  Patient affirms current coverage meets needs.  Patient's preferred pharmacy is:  [facility], [place] [address] Phone: [phone] Fax: [phone]  [facility] - [place], [place] - [address] [place] [address] Phone: [phone] Fax: [phone]   Follow Up:  Patient agrees to Care Plan and Follow-up.  Plan: follow up in 3 weeks - face to face (in Home)  Cherre Robins, PharmD Clinical Pharmacist [place] [phone]

## 2021-05-07 NOTE — Patient Instructions (Signed)
Ms. Carranza It was a pleasure speaking with you today.  I have attached a summary of our visit today and information about your health goals.    If you have any questions or concerns, please feel free to contact me either at the phone number below or with a MyChart message.   Keep up the good work!  [name], PharmD Clinical Pharmacist [facility] [phone] (direct line)  [phone] (main office number)  CARE PLAN ENTRY  Hypertension Screening BP Readings from Last 3 Encounters:  04/21/21 (!) 108/59  02/16/21 (!) 102/56  01/09/21 (!) 143/93   Pharmacist Clinical Goal(s): Over the next 90 days, patient will work with PharmD and providers to maintain BP goal <130/80 Current regimen:  Metoprolol Succinate 25mg  0.5 tab daily Lisinopril 2.5mg  daily Interventions: Discussed blood pressure goal Discussed importance of taking metoprolol and lisinopril for heart health Patient self care activities - Over the next 90 days, patient will: Maintain blood pressure less than 130/80 Consider purchasing blood pressure cuff and check blood pressure 2 to 3 times per week and record Continue current medications    Hyperlipidemia / Heart health Lab Results  Component Value Date/Time   LDLCALC 125 (H) 08/18/2020 12:03 PM   LDLCALC 108 (H) 03/14/2020 02:39 PM   Pharmacist Clinical Goal(s): Over the next 90 days, patient will work with PharmD and providers to achieve LDL goal < 70 Current regimen:  Atorvastatin 80mg  daily  Ezetimibe 10mg  daily Aspirin 81mg  daily  Metoprolol succinate ER 25mg  - take 0.5 tablet daily Nitroglycerin 0.4mg  - place 1 tablet under the tongue as needed for chest pain, may repeat in 5 minutes if chest pain not relieved.  Interventions: Discussed LDL goal and importance of medication adherence Repeat LDL at next office visit since ezetimibe started Discussed adherence Patient self care activities - Over the next 90 days,  patient will: Continue medications currently prescribed to lower cholesterol and to prevent heart attack  Anxiety / Depression:  Pharmacist Clinical Goal(s): Over the next 45 days, patient will work with PharmD and providers to maintain control of anxiety and depression symptoms Current regimen:  Escitalopram 20mg  daily  Diazepam 10mg  take 1 tablet every 12 hours if needed for anxiety Interventions: Discussed using diazepam only if needed. Limit use during daytime if able.  Patient self care activities - Over the next 90 days, patient will: Maintain current medication regimen for anxiety and depression  Remember to take diazepam only as needed.   Gout:  Pharmacist Clinical Goal(s): Over the next 90 days, patient will work with PharmD and providers to prevent acute gout episodes / lower uric acid Current regimen:  Allopurinol 100mg  - take 2 tablets = 200mg  daily (prevention) Colchicine 0.6mg  take up to twice a day as needed for gout attack Interventions: Discussed difference between maintenance / presentation medication and treatment Patient self care activities - Over the next 90 days, patient will: Maintain current medication regimen for gout  Pre-Diabetes Lab Results  Component Value Date/Time   HGBA1C 6.5 01/09/2021 08:10 AM   HGBA1C 6.2 (H) 09/17/2020 10:38 AM   Pharmacist Clinical Goal(s): Over the next 90 days, patient will work with PharmD and providers to maintain A1c goal <6.5% Current regimen:  Diet and exercise management   Interventions: Discussed the importance of limiting carbohydrates (30-45 grams per meal) Patient self care activities - Over the next 90 days, patient will: Maintain a1c <6.5%  Hypothyroidism Pharmacist Clinical Goal(s) Over the next 90 days, patient  will work with PharmD and providers to maintain TSH within normal limits and reduce risk of symptoms associated with hypothyroidism Current regimen:  Euthyrox / levothyroxine  daily Interventions: Discussed importance of medication adherence Reminded to separate Euthyrox / levothyroxine dose by 30 minutes from breakfast and morning medications Patient self care activities - Over the next 90 days, patient will: Maintain hypothyroidism medication regimen  Medication management Pharmacist Clinical Goal(s): Over the next 90 days, patient will work with PharmD and providers to achieve optimal medication adherence Current pharmacy: [facility] Intervention:  Comprehensive medication review completed.  Coordinated packaging and refill for famotidine with pharmacy Will assist with transition back to [facility] and packaging Until patient is able to get packaging thru [facility] will assist with filling weekly pill container Patient self care activities - Over the next 90 days, patient will: Focus on medication adherence by filling and taking medications appropriately  Take medications as prescribed Report any questions or concerns to PharmD and/or provider(s) Contact Erminie Foulks, PharmD if any issues with medications   Previous Care Plan has not changed - provided to patient after appointment 1 week ago.

## 2021-05-08 ENCOUNTER — Telehealth: Payer: Self-pay

## 2021-05-08 NOTE — Telephone Encounter (Signed)
° °  Telephone encounter was:  Successful.  05/08/2021 Name: MONDA CHASTAIN MRN: [id number] DOB: [birth year]  LASHAWNNA LAMBRECHT is a 81 y.o. year old female who is a primary care patient of [name], NP . The community resource team was consulted for assistance with Transportation Needs , Food Insecurity, and senior activities  Care guide performed the following interventions: Spoke with patient about Meals on [employer],  the [employer], and [employer]. Ms. Scrima gave consent to submit a NCCARE360 referral to Meals on Wheels.  Follow Up Plan:  Care guide will follow up with patient by phone over the next 7-10 days  Gardy Montanari, AAS Paralegal, [facility] Care Guide  Embedded Care Coordination [facility]   Care Management  [address] [place], [place] [address] ??[email or web address]   ?? [phone]   [email or web address]

## 2021-05-13 ENCOUNTER — Ambulatory Visit: Payer: Medicare HMO | Admitting: *Deleted

## 2021-05-13 DIAGNOSIS — N189 Chronic kidney disease, unspecified: Secondary | ICD-10-CM

## 2021-05-13 DIAGNOSIS — I10 Essential (primary) hypertension: Secondary | ICD-10-CM

## 2021-05-13 DIAGNOSIS — F4321 Adjustment disorder with depressed mood: Secondary | ICD-10-CM

## 2021-05-13 DIAGNOSIS — I5181 Takotsubo syndrome: Secondary | ICD-10-CM

## 2021-05-13 DIAGNOSIS — J449 Chronic obstructive pulmonary disease, unspecified: Secondary | ICD-10-CM

## 2021-05-13 DIAGNOSIS — I2510 Atherosclerotic heart disease of native coronary artery without angina pectoris: Secondary | ICD-10-CM

## 2021-05-13 DIAGNOSIS — J441 Chronic obstructive pulmonary disease with (acute) exacerbation: Secondary | ICD-10-CM

## 2021-05-13 DIAGNOSIS — F418 Other specified anxiety disorders: Secondary | ICD-10-CM

## 2021-05-13 DIAGNOSIS — E785 Hyperlipidemia, unspecified: Secondary | ICD-10-CM

## 2021-05-13 NOTE — Patient Instructions (Signed)
Visit Information   Thank you for taking time to visit with me today. Please don't hesitate to contact me if I can be of assistance to you before our next scheduled telephone appointment.  Following are the goals we discussed today:  Patient Goals/Self-Care Activities: Begin personal counseling with LCSW on a bi-weekly basis, to reduce and manage symptoms of Depression, Anxiety, and Grief, until well-established with [place]. Accept all calls from representative with [facility], in an effort to establish ongoing mental health counseling and supportive services. Incorporate into daily practice - relaxation techniques, deep breathing exercises and mindfulness meditation strategies. Talk about feelings with a friend, family members, or spiritual advisor. Continue with compliance of taking prescription medications, try to obtain adequate rest, stay well-hydrated and eat a healthy, well-balanced diet. Contact LCSW directly (# [phone]), if you have questions, need assistance, or if additional social work needs are identified between now and our next scheduled telephone outreach call. Follow-Up Date:  05/19/2021 at 1:30 pm  Please call the care guide team at [phone] if you need to cancel or reschedule your appointment.   If you are experiencing a Mental Health or [place] or need someone to talk to, please call the Suicide and Crisis Lifeline: 988 call the [place] National Suicide Prevention Lifeline: (805) 070-1235 or TTY: 202-221-3258 TTY 878-011-5021) to talk to a trained counselor call 1-800-273-TALK (toll free, 24 hour hotline) go to [facility] [address], [place] [phone]) call the [facility]: [phone] call 911   Following is a copy of your full care plan:  Care Plan : LCSW Plan of Care  Updates made by Francis Gaines, LCSW since 05/13/2021 12:00 AM     Problem: Reduce and Manage My  Symptoms of Depression, Anxiety, and Grief.   Priority: High     Goal: Reduce and Manage My Symptoms of Depression, Anxiety, and Grief.   Start Date: 05/13/2021  Expected End Date: 08/10/2021  This Visit's Progress: On track  Priority: High  Note:   Current Barriers:   Patient with Acute Mental Health Needs related to Depression with Anxiety, Generalized Anxiety Disorder, and Grief Reaction, requires Support, Education, Resources, Referrals, Advocacy, and Care Coordination, in order to meet Unmet Acute Mental Health Needs. Patient lacks knowledge of available community counseling agencies and resources. Clinical Goal(s):  Patient will work with LCSW to reduce and manage symptoms of Depression, Anxiety and Grief, until well-established with a community provider.   Patient will increase knowledge and/or ability of:        Coping Skills, Healthy Habits, Self-Management Skills, Stress Reduction, Home Safety and Utilizing [employer] and Resources.   Interventions:  Assessed patient's previous treatment, needs, coping skills, current treatment, support system and barriers to care. Collaboration with Primary Care Provider, Nurse Practitioner, Debbrah Alar regarding development and update of comprehensive plan of care as evidenced by provider attestation and co-signature. Inter-disciplinary care team collaboration (see longitudinal plan of care). Clinical Interventions: PHQ-2 and PHQ-9 Depression Screening Tool performed and results reviewed with patient. Suicidal Ideation/Homicidal Ideation Assessed - none present.       Solution-Focused Therapy Performed. Mindfulness Meditation Strategies, Relaxation Techniques, and Deep Breathing Exercises Discussed and Encouraged Daily. Active Listening/Reflection Utilized. Verbalization of Feelings Encouraged, and Emotional Support Provided. Problem Solving/Task-Centered Solutions Developed. Brief Cognitive Behavioral Therapy Initiated. Mental  Health Medications Reviewed, and Compliance Emphasized.   Quality of Sleep Assessed and Sleep Hygiene Techniques Promoted.      Discussed plans with  patient for ongoing care management follow-up and provided patient with direct contact information for care management team. Discussed several options for long-term counseling based on need and insurance, and verbal consent obtained to submit referral to [facility], for ongoing mental health counseling and supportive services.  Referral submitted to [facility], via [facility], for long-term mental health counseling and supportive services. Patient Goals/Self-Care Activities: Begin personal counseling with LCSW on a bi-weekly basis, to reduce and manage symptoms of Depression, Anxiety, and Grief, until well-established with [place]. Accept all calls from representative with [facility] in an effort to establish ongoing mental health counseling and supportive services. Incorporate into daily practice - relaxation techniques, deep breathing exercises and mindfulness meditation strategies. Talk about feelings with a friend, family members, or spiritual advisor. Continue with compliance of taking prescription medications, try to obtain adequate rest, stay well-hydrated and eat a healthy, well-balanced diet. Contact LCSW directly (# [phone]), if you have questions, need assistance, or if additional social work needs are identified between now and our next scheduled telephone outreach call. Follow-Up Date:  05/19/2021 at 1:30 pm       Consent to CCM Services: Ms. Slatten was given information about Chronic Care Management services including:  CCM service includes personalized support from designated clinical staff supervised by her physician, including individualized plan of care and coordination with other care providers 24/7 contact phone numbers for assistance for urgent and routine care needs. Service will only be billed  when office clinical staff spend 20 minutes or more in a month to coordinate care. Only one practitioner may furnish and bill the service in a calendar month. The patient may stop CCM services at any time (effective at the end of the month) by phone call to the office staff. The patient will be responsible for cost sharing (co-pay) of up to 20% of the service fee (after annual deductible is met).  Patient agreed to services and verbal consent obtained.   Patient verbalizes understanding of instructions and care plan provided today and agrees to view in [place]. Active MyChart status confirmed with patient.    Telephone follow up appointment with care management team member scheduled for:  05/19/2021 at 1:30 pm.  Nat Christen Perry Licensed Clinical Social Worker [place] [phone]

## 2021-05-13 NOTE — Chronic Care Management (AMB) (Signed)
Chronic Care Management    Clinical Social Work Note  05/13/2021 Name: Veronica Wolf MRN: [id number] DOB: 1940/12/19  Veronica Wolf is a 81 y.o. year old female who is a primary care patient of [name], NP. The CCM team was consulted to assist the patient with chronic disease management and/or care coordination needs related to: [employer], Mental Health Counseling and Resources, and Grief Counseling.   Engaged with patient by telephone for initial visit in response to provider referral for social work chronic care management and care coordination services.   Consent to Services:  The patient was given information about Chronic Care Management services, agreed to services, and gave verbal consent prior to initiation of services.  Please see initial visit note for detailed documentation.   Patient agreed to services and consent obtained.   Assessment: Review of patient past medical history, allergies, medications, and health status, including review of relevant consultants reports was performed today as part of a comprehensive evaluation and provision of chronic care management and care coordination services.     SDOH (Social Determinants of Health) assessments and interventions performed:  SDOH Interventions    Flowsheet Row Most Recent Value  SDOH Interventions   Food Insecurity Interventions Intervention Not Indicated, Other (Comment)  [Working with [employer] Care Guide to [employer] Food/Nutrition Resources]  Financial Strain Interventions Intervention Not Indicated  Housing Interventions Intervention Not Indicated  Intimate Partner Violence Interventions Intervention Not Indicated  Physical Activity Interventions Patient Refused  Stress Interventions Offered [employer] Resources, Provide Counseling, Other (Comment)  Research officer, trade union for Counseling Services]  Social Connections Interventions Patient Refused  Transportation Interventions Intervention Not Indicated,  Other (Comment)  [Working with [employer] Guide to [employer] and Resources]  Depression Interventions/Treatment  Referral to Psychiatry, Medication, Counseling        Advanced Directives Status: Not ready or willing to discuss.  CCM Care Plan  Allergies  Allergen Reactions   Ibuprofen Other (See Comments)    Pt has hx of peptic ulcer and diverticulitis.     Outpatient Encounter Medications as of 05/13/2021  Medication Sig Note   albuterol (PROVENTIL) (2.5 MG/3ML) 0.083% nebulizer solution USE 1 VIAL IN NEBULIZER EVERY 6 HOURS AS NEEDED FOR WHEEZING AND FOR SHORTNESS OF BREATH    allopurinol (ZYLOPRIM) 100 MG tablet Take 2 tablets (200 mg total) by mouth daily.    aspirin 81 MG EC tablet Take 1 tablet (81 mg total) by mouth daily. Swallow whole. (Patient not taking: Reported on 05/07/2021) 05/07/2021: Patient has ran out but will purchase OTC   atorvastatin (LIPITOR) 80 MG tablet Take 1 tablet (80 mg total) by mouth daily.    benzonatate (TESSALON) 100 MG capsule Take 1 capsule (100 mg total) by mouth every 8 (eight) hours. (Patient not taking: Reported on 05/07/2021)    colchicine 0.6 MG tablet Take 0.6 mg by mouth daily as needed (gout). (Patient not taking: Reported on 05/07/2021)    diazepam (VALIUM) 10 MG tablet TAKE ONE TABLET BY MOUTH EVERY TWELVE HOURS AS NEEDED FOR ANXIETY    dicyclomine (BENTYL) 10 MG capsule Take 1 capsule (10 mg total) by mouth in the morning, at noon, in the evening, and at bedtime.    escitalopram (LEXAPRO) 20 MG tablet Take 1 tablet (20 mg total) by mouth daily.    ezetimibe (ZETIA) 10 MG tablet Take 1 tablet (10 mg total) by mouth daily.    famotidine (PEPCID) 20 MG tablet Take 1 tablet (20 mg total) by mouth  2 (two) times daily. In the morning and at bedtime    isosorbide mononitrate (IMDUR) 30 MG 24 hr tablet Take 1 tablet (30 mg total) by mouth daily.    levothyroxine (SYNTHROID) 25 MCG tablet TAKE ONE TABLET BY MOUTH EVERY MORNING     lisinopril (ZESTRIL) 2.5 MG tablet Take 1 tablet (2.5 mg total) by mouth daily.    meclizine (ANTIVERT) 25 MG tablet TAKE ONE TABLET BY MOUTH twive daily AS NEEDED FOR dizziness    metoprolol succinate (TOPROL-XL) 25 MG 24 hr tablet Take 0.5 tablets (12.5 mg total) by mouth daily.    nitroGLYCERIN (NITROSTAT) 0.4 MG SL tablet Place 1 tablet (0.4 mg total) under the tongue every 5 (five) minutes as needed for chest pain.    nystatin (MYCOSTATIN/NYSTOP) powder APPLY POWDER TOPICALLY TO AFFECTED AREA 2 TIMES DAILY    pantoprazole (PROTONIX) 40 MG tablet Take 1 tablet (40 mg total) by mouth 2 (two) times daily.    potassium chloride SA (KLOR-CON M) 20 MEQ tablet TAKE ONE TABLET BY MOUTH AT NOON    sucralfate (CARAFATE) 1 g tablet TAKE ONE TABLET BY MOUTH FOUR TIMES DAILY. MAY cut pill in half AND DISSOLVE in water prior TO drinking    No facility-administered encounter medications on file as of 05/13/2021.    Patient Active Problem List   Diagnosis Date Noted   Cough 04/21/2021   Left otitis media 02/16/2021   Back pain 12/03/2020   Acute cystitis without hematuria 12/03/2020   Grief reaction 12/03/2020   Fatty liver 12/01/2020   Intermittent left-sided chest pain 09/17/2020   Coronary artery disease involving native coronary artery of native heart [id number]   Metabolic syndrome [id number]   Prediabetes 09/17/2020   Allergy 09/16/2020   Anxiety 09/16/2020   Hypertension 09/16/2020   Vertigo 09/16/2020   Dark stools 09/08/2020   Dysphagia    Esophageal stricture    Hiatal hernia    Epigastric pain    Hypothyroid 01/25/2018   Mild CAD 10/03/2017   Stress-induced cardiomyopathy 09/19/2017   COPD exacerbation ([place]) 09/14/2017   Hypokalemia 09/14/2017   Non-ST elevation (NSTEMI) myocardial infarction [facility]) - due to Demand Infarction 09/14/2017   CKD (chronic kidney disease) 04/04/2017   HLD (hyperlipidemia)    Allergic rhinitis    Gout 02/28/2017   Depression with anxiety 02/28/2017    Epigastric abdominal pain 12/22/2016   COPD with chronic bronchitis ([place]) 12/22/2016   GERD (gastroesophageal reflux disease) 12/22/2016   DOE (dyspnea on exertion) 12/22/2016   Peptic ulcer 01/04/2003    Conditions to be addressed/monitored: Anxiety and Depression.  Limited Social Support, Mental Health Concerns, Social Isolation, and Lacks Knowledge of [employer].  Care Plan : LCSW Plan of Care  Updates made by Francis Gaines, LCSW since 05/13/2021 12:00 AM     Problem: Reduce and Manage My Symptoms of Depression, Anxiety, and Grief.   Priority: High     Goal: Reduce and Manage My Symptoms of Depression, Anxiety, and Grief.   Start Date: 05/13/2021  Expected End Date: 08/10/2021  This Visit's Progress: On track  Priority: High  Note:   Current Barriers:   Patient with Acute Mental Health Needs related to Depression with Anxiety, Generalized Anxiety Disorder, and Grief Reaction, requires Support, Education, Resources, Referrals, Advocacy, and Care Coordination, in order to meet Unmet Acute Mental Health Needs. Patient lacks knowledge of available community counseling agencies and resources. Clinical Goal(s):  Patient will work with LCSW to reduce and manage symptoms of Depression,  Anxiety and Grief, until well-established with a community provider.   Patient will increase knowledge and/or ability of:        Coping Skills, Healthy Habits, Self-Management Skills, Stress Reduction, Home Safety and Utilizing [employer] and Resources.   Interventions:  Assessed patient's previous treatment, needs, coping skills, current treatment, support system and barriers to care. Collaboration with Primary Care Provider, Nurse Practitioner, Veronica Wolf regarding development and update of comprehensive plan of care as evidenced by provider attestation and co-signature. Inter-disciplinary care team collaboration (see longitudinal plan of care). Clinical Interventions: PHQ-2  and PHQ-9 Depression Screening Tool performed and results reviewed with patient. Suicidal Ideation/Homicidal Ideation Assessed - none present.       Solution-Focused Therapy Performed. Mindfulness Meditation Strategies, Relaxation Techniques, and Deep Breathing Exercises Discussed and Encouraged Daily. Active Listening/Reflection Utilized. Verbalization of Feelings Encouraged, and Emotional Support Provided. Problem Solving/Task-Centered Solutions Developed. Brief Cognitive Behavioral Therapy Initiated. Mental Health Medications Reviewed, and Compliance Emphasized.   Quality of Sleep Assessed and Sleep Hygiene Techniques Promoted.      Discussed plans with patient for ongoing care management follow-up and provided patient with direct contact information for care management team. Discussed several options for long-term counseling based on need and insurance, and verbal consent obtained to submit referral to [facility], for ongoing mental health counseling and supportive services.  Referral submitted to [facility], via [facility], for long-term mental health counseling and supportive services. Patient Goals/Self-Care Activities: Begin personal counseling with LCSW on a bi-weekly basis, to reduce and manage symptoms of Depression, Anxiety, and Grief, until well-established with [place]. Accept all calls from representative with [facility], in an effort to establish ongoing mental health counseling and supportive services. Incorporate into daily practice - relaxation techniques, deep breathing exercises and mindfulness meditation strategies. Talk about feelings with a friend, family members, or spiritual advisor. Continue with compliance of taking prescription medications, try to obtain adequate rest, stay well-hydrated and eat a healthy, well-balanced diet. Contact LCSW directly (# [phone]), if you have questions, need assistance, or if additional social work needs are  identified between now and our next scheduled telephone outreach call. Follow-Up Date:  05/19/2021 at 1:30 pm     Nat Christen [facility] Clinical Social Worker [place] [phone]

## 2021-05-18 ENCOUNTER — Telehealth: Payer: Self-pay

## 2021-05-18 NOTE — Telephone Encounter (Signed)
° °  Telephone encounter was:  Unsuccessful.  05/18/2021 Name: LINNAEA AHN MRN: [id number] DOB: [birth year]  Unsuccessful outbound call made today to assist with:   Meals on Wheels  Outreach Attempt:  1st Attempt. Left message for Rosaland Lao at [facility] on Wheels to follow-up on NCCARE360 referral placed 05/08/21.   A HIPAA compliant voice message was left requesting a return call.  Instructed patient to call back at [phone].  Mahrosh Donnell, AAS Paralegal, [facility] Care Guide  Embedded Care Coordination [place]   Care Management  [address] [place], [place] [address] ??[email or web address]   ?? [phone]   [email or web address]

## 2021-05-19 ENCOUNTER — Ambulatory Visit: Payer: Medicare HMO | Admitting: *Deleted

## 2021-05-19 ENCOUNTER — Ambulatory Visit: Payer: Medicare HMO

## 2021-05-19 ENCOUNTER — Encounter: Payer: Self-pay | Admitting: *Deleted

## 2021-05-19 DIAGNOSIS — E876 Hypokalemia: Secondary | ICD-10-CM

## 2021-05-19 DIAGNOSIS — I10 Essential (primary) hypertension: Secondary | ICD-10-CM

## 2021-05-19 DIAGNOSIS — J449 Chronic obstructive pulmonary disease, unspecified: Secondary | ICD-10-CM

## 2021-05-19 DIAGNOSIS — I5181 Takotsubo syndrome: Secondary | ICD-10-CM

## 2021-05-19 DIAGNOSIS — I2510 Atherosclerotic heart disease of native coronary artery without angina pectoris: Secondary | ICD-10-CM

## 2021-05-19 DIAGNOSIS — F4321 Adjustment disorder with depressed mood: Secondary | ICD-10-CM

## 2021-05-19 DIAGNOSIS — N189 Chronic kidney disease, unspecified: Secondary | ICD-10-CM

## 2021-05-19 DIAGNOSIS — E782 Mixed hyperlipidemia: Secondary | ICD-10-CM

## 2021-05-19 DIAGNOSIS — J441 Chronic obstructive pulmonary disease with (acute) exacerbation: Secondary | ICD-10-CM

## 2021-05-19 DIAGNOSIS — F418 Other specified anxiety disorders: Secondary | ICD-10-CM

## 2021-05-19 DIAGNOSIS — F419 Anxiety disorder, unspecified: Secondary | ICD-10-CM

## 2021-05-19 NOTE — Patient Instructions (Addendum)
Visit Information  Thank you for taking time to visit with me today. Please don't hesitate to contact me if I can be of assistance to you before our next scheduled telephone appointment.  Following are the goals we discussed today:  Patient Goals/Self-Care Activities: Take medications as prescribed   Attend all scheduled provider appointments Continue to keep track of how you are feeling and any symptoms. Call provider office for new Health concerns or questions Obtain a blood pressure monitor and begin checking your blood pressure. You can check with your insurance company to see if you have an Over-the Counter Benefit to be able to get a blood pressure monitor Reminder: Ask your daughter to contact your eye doctor and schedule an Eye Exam Call your nurse care manager for care management and/or care coordination needs as needed  Our next appointment is by telephone on 06/22/21 at 2:15 pm  Please call the care guide team at [phone] if you need to cancel or reschedule your appointment.   If you are experiencing a Mental Health or Behavioral Health Crisis or need someone to talk to, please call the Suicide and Crisis Lifeline: 988 call 1-800-273-TALK (toll free, 24 hour hotline)   The patient verbalized understanding of instructions, educational materials, and care plan provided today and agreed to receive a mailed copy of patient instructions, educational materials, and care plan.   Kathyrn Sheriff, RN, MSN, BSN, CCM Care Management Coordinator [facility] [phone]

## 2021-05-19 NOTE — Chronic Care Management (AMB) (Signed)
Chronic Care Management   CCM RN Visit Note  05/19/2021 Name: Veronica Wolf MRN: [id number] DOB: [birth year]  Subjective: Veronica Wolf is a 81 y.o. year old female who is a primary care patient of Veronica Alar, NP. The care management team was consulted for assistance with disease management and care coordination needs.    Engaged with patient by telephone for follow up visit in response to provider referral for case management and/or care coordination services.   Consent to Services:  The patient was given information about Chronic Care Management services, agreed to services, and gave verbal consent prior to initiation of services.  Please see initial visit note for detailed documentation.   Patient agreed to services and verbal consent obtained.   Assessment: Review of patient past medical history, allergies, medications, health status, including review of consultants reports, laboratory and other test data, was performed as part of comprehensive evaluation and provision of chronic care management services.   SDOH (Social Determinants of Health) assessments and interventions performed:    CCM Care Plan  Allergies  Allergen Reactions   Ibuprofen Other (See Comments)    Pt has hx of peptic ulcer and diverticulitis.     Outpatient Encounter Medications as of 05/19/2021  Medication Sig Note   albuterol (PROVENTIL) (2.5 MG/3ML) 0.083% nebulizer solution USE 1 VIAL IN NEBULIZER EVERY 6 HOURS AS NEEDED FOR WHEEZING AND FOR SHORTNESS OF BREATH    allopurinol (ZYLOPRIM) 100 MG tablet Take 2 tablets (200 mg total) by mouth daily.    aspirin 81 MG EC tablet Take 1 tablet (81 mg total) by mouth daily. Swallow whole. 05/07/2021: Patient has ran out but will purchase OTC   atorvastatin (LIPITOR) 80 MG tablet Take 1 tablet (80 mg total) by mouth daily.    diazepam (VALIUM) 10 MG tablet TAKE ONE TABLET BY MOUTH EVERY TWELVE HOURS AS NEEDED FOR ANXIETY    dicyclomine (BENTYL) 10 MG capsule  Take 1 capsule (10 mg total) by mouth in the morning, at noon, in the evening, and at bedtime.    escitalopram (LEXAPRO) 20 MG tablet Take 1 tablet (20 mg total) by mouth daily.    ezetimibe (ZETIA) 10 MG tablet Take 1 tablet (10 mg total) by mouth daily.    famotidine (PEPCID) 20 MG tablet Take 1 tablet (20 mg total) by mouth 2 (two) times daily. In the morning and at bedtime 05/19/2021: Reports takes as needed   isosorbide mononitrate (IMDUR) 30 MG 24 hr tablet Take 1 tablet (30 mg total) by mouth daily.    levothyroxine (SYNTHROID) 25 MCG tablet TAKE ONE TABLET BY MOUTH EVERY MORNING    lisinopril (ZESTRIL) 2.5 MG tablet Take 1 tablet (2.5 mg total) by mouth daily.    meclizine (ANTIVERT) 25 MG tablet TAKE ONE TABLET BY MOUTH twive daily AS NEEDED FOR dizziness    metoprolol succinate (TOPROL-XL) 25 MG 24 hr tablet Take 0.5 tablets (12.5 mg total) by mouth daily.    nitroGLYCERIN (NITROSTAT) 0.4 MG SL tablet Place 1 tablet (0.4 mg total) under the tongue every 5 (five) minutes as needed for chest pain.    nystatin (MYCOSTATIN/NYSTOP) powder APPLY POWDER TOPICALLY TO AFFECTED AREA 2 TIMES DAILY    pantoprazole (PROTONIX) 40 MG tablet Take 1 tablet (40 mg total) by mouth 2 (two) times daily.    potassium chloride SA (KLOR-CON M) 20 MEQ tablet TAKE ONE TABLET BY MOUTH AT NOON    sucralfate (CARAFATE) 1 g tablet TAKE ONE TABLET BY  MOUTH FOUR TIMES DAILY. MAY cut pill in half AND DISSOLVE in water prior TO drinking    benzonatate (TESSALON) 100 MG capsule Take 1 capsule (100 mg total) by mouth every 8 (eight) hours. (Patient not taking: Reported on 05/07/2021)    colchicine 0.6 MG tablet Take 0.6 mg by mouth daily as needed (gout). (Patient not taking: Reported on 05/07/2021)    No facility-administered encounter medications on file as of 05/19/2021.    Patient Active Problem List   Diagnosis Date Noted   Cough 04/21/2021   Left otitis media 02/16/2021   Back pain 12/03/2020   Acute cystitis without  hematuria 12/03/2020   Grief reaction 12/03/2020   Fatty liver 12/01/2020   Intermittent left-sided chest pain 09/17/2020   Coronary artery disease involving native coronary artery of native heart 85/27/7824   Metabolic syndrome 23/53/6144   Prediabetes 09/17/2020   Allergy 09/16/2020   Anxiety 09/16/2020   Hypertension 09/16/2020   Vertigo 09/16/2020   Dark stools 09/08/2020   Dysphagia    Esophageal stricture    Hiatal hernia    Epigastric pain    Hypothyroid 01/25/2018   Mild CAD 10/03/2017   Stress-induced cardiomyopathy 09/19/2017   COPD exacerbation ([place]) 09/14/2017   Hypokalemia 09/14/2017   Non-ST elevation (NSTEMI) myocardial infarction [facility]) - due to Demand Infarction 09/14/2017   CKD (chronic kidney disease) 04/04/2017   HLD (hyperlipidemia)    Allergic rhinitis    Gout 02/28/2017   Depression with anxiety 02/28/2017   Epigastric abdominal pain 12/22/2016   COPD with chronic bronchitis ([place]) 12/22/2016   GERD (gastroesophageal reflux disease) 12/22/2016   DOE (dyspnea on exertion) 12/22/2016   Peptic ulcer 01/04/2003    Conditions to be addressed/monitored:HTN and COPD  Care Plan : RN Care Manager Plan of Care  Updates made by Luretha Rued, RN since 05/19/2021 12:00 AM     Problem: Chronic disease Managment education and/or care coordination needs   Priority: High     Long-Range Goal: Development of Plan of Care for Chronic Disease Managment and/or care coordination needs   Start Date: 05/04/2021  Expected End Date: 11/01/2021  Priority: High  Note:   Current Barriers: [name] states she has not had to use her rescue inhaler and states, " I feel good about that".  She reports she is very happy with the medication management system set up by the  clinical pharmacist and states it is working well for her. She denies any questions or concerns at this time.  Care Coordination needs related to Limited social support and Lacks knowledge of community  resource: grieving loss of her brother  Chronic Disease Management support and education needs related to HTN and COPD Medication management/adherence-clinical pharmacist involved Does not have blood pressure monitor  RNCM Clinical Goal(s):  Patient will verbalize understanding of plan for management of HTN and COPD as evidenced by self report and/or chart notatation demonstrate improved adherence to prescribed treatment plan for HTN and COPD as evidenced by self report and/or chart notation continue to work with RN Care Manager and/or Social Worker to address care management and care coordination needs related to HTN and COPD as evidenced by adherence to CM Team Scheduled appointments     work with [employer] care guide to address needs related to Limited social support, Transport planner, and Lacks knowledge of community resource: report knowledge of available community resource as evidenced by patient and/or community resource care guide support    through collaboration with Consulting civil engineer,  provider, and care team.   Interventions: 1:1 collaboration with primary care provider regarding development and update of comprehensive plan of care as evidenced by provider attestation and co-signature Inter-disciplinary care team collaboration (see longitudinal plan of care) Evaluation of current treatment plan related to  self management and patient's adherence to plan as established by provider LCSW referral regarding recent loss of her brother, loss of independence, decrease in social activities Care Guide referral regarding community resources for social engagement  COPD Interventions:  (Status:  Goal on track:  Yes.) Long Term Goal Medications reviewed and encouraged to continue to take medications as prescribed Encouraged to attend provider visits as scheduled  Hypertension Interventions:  (Status:  Goal on track:  Yes.) Long Term Goal Last practice recorded BP readings:  BP Readings from  Last 3 Encounters:  04/21/21 (!) 108/59  02/16/21 (!) 102/56  01/09/21 (!) 143/93  Most recent eGFR/CrCl: No results found for: EGFR  No components found for: CRCL  Discussed medication adherence-Ms. Oglesby states she is very [employer] manager. She states it is helping her manage her medications.  Reviewed options for obtaining blood pressure monitor. Transferred to insurance provider re: over the counter benefit.   Patient Goals/Self-Care Activities: Take medications as prescribed   Attend all scheduled provider appointments Continue to keep track of how you are feeling and any symptoms. Call provider office for new Health concerns or questions Obtain a blood pressure monitor and begin checking your blood pressure. You can check with your insurance company to see if you have an Over-the Counter Benefit to be able to get a blood pressure monitor Reminder: Ask your daughter to contact your eye doctor and schedule an Eye Exam Call your nurse care manager for care management and/or care coordination needs as needed   Plan:Telephone follow up appointment with care management team member scheduled for:  06/22/21 The patient has been provided with contact information for the care management team and has been advised to call with any health related questions or concerns.   Thea Silversmith, RN, MSN, BSN, CCM Care Management Coordinator [facility] [phone]

## 2021-05-19 NOTE — Chronic Care Management (AMB) (Signed)
Chronic Care Management    Clinical Social Work Note  05/19/2021 Name: Veronica Wolf MRN: [id number] DOB: 06-03-1940  DECLAN KINSTLER is a 81 y.o. year old female who is a primary care patient of Debbrah Alar, NP. The CCM team was consulted to assist the patient with chronic disease management and/or care coordination needs related to: [employer], Mental Health Counseling and Resources, and Grief Counseling.   Engaged with patient by telephone for follow up visit in response to provider referral for social work chronic care management and care coordination services.   Consent to Services:  The patient was given information about Chronic Care Management services, agreed to services, and gave verbal consent prior to initiation of services.  Please see initial visit note for detailed documentation.   Patient agreed to services and consent obtained.   Assessment: Review of patient past medical history, allergies, medications, and health status, including review of relevant consultants reports was performed today as part of a comprehensive evaluation and provision of chronic care management and care coordination services.     SDOH (Social Determinants of Health) assessments and interventions performed:    Advanced Directives Status: Not addressed in this encounter.  CCM Care Plan  Allergies  Allergen Reactions   Ibuprofen Other (See Comments)    Pt has hx of peptic ulcer and diverticulitis.     Outpatient Encounter Medications as of 05/19/2021  Medication Sig Note   albuterol (PROVENTIL) (2.5 MG/3ML) 0.083% nebulizer solution USE 1 VIAL IN NEBULIZER EVERY 6 HOURS AS NEEDED FOR WHEEZING AND FOR SHORTNESS OF BREATH    allopurinol (ZYLOPRIM) 100 MG tablet Take 2 tablets (200 mg total) by mouth daily.    aspirin 81 MG EC tablet Take 1 tablet (81 mg total) by mouth daily. Swallow whole. (Patient not taking: Reported on 05/07/2021) 05/07/2021: Patient has ran out but will purchase OTC    atorvastatin (LIPITOR) 80 MG tablet Take 1 tablet (80 mg total) by mouth daily.    benzonatate (TESSALON) 100 MG capsule Take 1 capsule (100 mg total) by mouth every 8 (eight) hours. (Patient not taking: Reported on 05/07/2021)    colchicine 0.6 MG tablet Take 0.6 mg by mouth daily as needed (gout). (Patient not taking: Reported on 05/07/2021)    diazepam (VALIUM) 10 MG tablet TAKE ONE TABLET BY MOUTH EVERY TWELVE HOURS AS NEEDED FOR ANXIETY    dicyclomine (BENTYL) 10 MG capsule Take 1 capsule (10 mg total) by mouth in the morning, at noon, in the evening, and at bedtime.    escitalopram (LEXAPRO) 20 MG tablet Take 1 tablet (20 mg total) by mouth daily.    ezetimibe (ZETIA) 10 MG tablet Take 1 tablet (10 mg total) by mouth daily.    famotidine (PEPCID) 20 MG tablet Take 1 tablet (20 mg total) by mouth 2 (two) times daily. In the morning and at bedtime    isosorbide mononitrate (IMDUR) 30 MG 24 hr tablet Take 1 tablet (30 mg total) by mouth daily.    levothyroxine (SYNTHROID) 25 MCG tablet TAKE ONE TABLET BY MOUTH EVERY MORNING    lisinopril (ZESTRIL) 2.5 MG tablet Take 1 tablet (2.5 mg total) by mouth daily.    meclizine (ANTIVERT) 25 MG tablet TAKE ONE TABLET BY MOUTH twive daily AS NEEDED FOR dizziness    metoprolol succinate (TOPROL-XL) 25 MG 24 hr tablet Take 0.5 tablets (12.5 mg total) by mouth daily.    nitroGLYCERIN (NITROSTAT) 0.4 MG SL tablet Place 1 tablet (0.4 mg total)  under the tongue every 5 (five) minutes as needed for chest pain.    nystatin (MYCOSTATIN/NYSTOP) powder APPLY POWDER TOPICALLY TO AFFECTED AREA 2 TIMES DAILY    pantoprazole (PROTONIX) 40 MG tablet Take 1 tablet (40 mg total) by mouth 2 (two) times daily.    potassium chloride SA (KLOR-CON M) 20 MEQ tablet TAKE ONE TABLET BY MOUTH AT NOON    sucralfate (CARAFATE) 1 g tablet TAKE ONE TABLET BY MOUTH FOUR TIMES DAILY. MAY cut pill in half AND DISSOLVE in water prior TO drinking    No facility-administered encounter  medications on file as of 05/19/2021.    Patient Active Problem List   Diagnosis Date Noted   Cough 04/21/2021   Left otitis media 02/16/2021   Back pain 12/03/2020   Acute cystitis without hematuria 12/03/2020   Grief reaction 12/03/2020   Fatty liver 12/01/2020   Intermittent left-sided chest pain 09/17/2020   Coronary artery disease involving native coronary artery of native heart 09/17/2020   Metabolic syndrome 09/17/2020   Prediabetes 09/17/2020   Allergy 09/16/2020   Anxiety 09/16/2020   Hypertension 09/16/2020   Vertigo 09/16/2020   Dark stools 09/08/2020   Dysphagia    Esophageal stricture    Hiatal hernia    Epigastric pain    Hypothyroid 01/25/2018   Mild CAD 10/03/2017   Stress-induced cardiomyopathy 09/19/2017   COPD exacerbation ([facility]) 09/14/2017   Hypokalemia 09/14/2017   Non-ST elevation (NSTEMI) myocardial infarction [facility]) - due to Demand Infarction 09/14/2017   CKD (chronic kidney disease) 04/04/2017   HLD (hyperlipidemia)    Allergic rhinitis    Gout 02/28/2017   Depression with anxiety 02/28/2017   Epigastric abdominal pain 12/22/2016   COPD with chronic bronchitis (HCC) 12/22/2016   GERD (gastroesophageal reflux disease) 12/22/2016   DOE (dyspnea on exertion) 12/22/2016   Peptic ulcer 01/04/2003    Conditions to be addressed/monitored: Anxiety, Depression, and Grief. Limited Social Support, Mental Health Concerns, Social Isolation, Limited Access to Caregiver, and Lacks Knowledge of [facility].  Care Plan : LCSW Plan of Care  Updates made by Karolee Stamps, LCSW since 05/19/2021 12:00 AM     Problem: Reduce and Manage My Symptoms of Depression, Anxiety, and Grief.   Priority: High     Goal: Reduce and Manage My Symptoms of Depression, Anxiety, and Grief.   Start Date: 05/13/2021  Expected End Date: 08/10/2021  This Visit's Progress: On track  Recent Progress: On track  Priority: High  Note:   Current Barriers:   Patient with Acute  Mental Health Needs related to Depression with Anxiety, Generalized Anxiety Disorder, and Grief Reaction, requires Support, Education, Resources, Referrals, Advocacy, and Care Coordination, in order to meet Unmet Acute Mental Health Needs. Patient lacks knowledge of available community counseling agencies and resources. Clinical Goal(s):  Patient will work with LCSW to reduce and manage symptoms of Depression, Anxiety and Grief, until well-established with a community provider.   Patient will increase knowledge and/or ability of:        Coping Skills, Healthy Habits, Self-Management Skills, Stress Reduction, Home Safety and Utilizing [employer] and Resources.   Interventions:  Collaboration with Primary Care Provider, Nurse Practitioner, [name] regarding development and update of comprehensive plan of care as evidenced by provider attestation and co-signature. Inter-disciplinary care team collaboration (see longitudinal plan of care). Clinical Interventions: Mindfulness Meditation Strategies, Relaxation Techniques, and Deep Breathing Exercises Reinforced Daily. Verbalization of Feelings Encouraged, and Emotional Support Provided. Problem Solving/Task-Centered Solutions Developed. Cognitive Behavioral Therapy  Initiated. Patient Goals/Self-Care Activities: Continue to receive personal counseling with LCSW on a bi-weekly basis, to reduce and manage symptoms of Depression, Anxiety, and Grief, until well-established with a community mental health provider. [facility] declined referral for ongoing mental health counseling and supportive services, as [facility] Medicare is not in-network.   Review list of counseling agencies, mailed to your home by LCSW on 05/19/2021, and be prepared to discuss agencies of interest, as well as make outreach calls, during our next scheduled telephone follow-up appointment: ~ [place] in [facility] ~ List of Therapists and  Psychiatrists in [place], [place] ~ List of Female Therapists in [place] that [employer] Medicare Continue to incorporate into daily practice - relaxation techniques, deep breathing exercises and mindfulness meditation strategies. Contact LCSW directly (# [phone]), if you have questions, need assistance, or if additional social work needs are identified between now and our next scheduled telephone outreach call. Follow-Up Date:  05/28/2021 at 1:30 pm     Nat Christen [place] Clinical Social Worker [place] [phone]

## 2021-05-19 NOTE — Patient Instructions (Signed)
Visit Information  Thank you for taking time to visit with me today. Please don't hesitate to contact me if I can be of assistance to you before our next scheduled telephone appointment.  Following are the goals we discussed today:  Patient Goals/Self-Care Activities: Continue to receive personal counseling with LCSW on a bi-weekly basis, to reduce and manage symptoms of Depression, Anxiety, and Grief, until well-established with a community mental health provider. [facility] declined referral for ongoing mental health counseling and supportive services, as [facility] Medicare is not in-network.   Review list of counseling agencies, mailed to your home by LCSW on 05/19/2021, and be prepared to discuss agencies of interest, as well as make outreach calls, during our next scheduled telephone follow-up appointment: ~ [place] in [facility] ~ List of Therapists and Psychiatrists in [place], [place] ~ List of Female Therapists in [place] that [employer] Medicare Continue to incorporate into daily practice - relaxation techniques, deep breathing exercises and mindfulness meditation strategies. Contact LCSW directly (# [phone]), if you have questions, need assistance, or if additional social work needs are identified between now and our next scheduled telephone outreach call. Follow-Up Date:  05/28/2021 at 1:30 pm Please call the care guide team at [phone] if you need to cancel or reschedule your appointment.   If you are experiencing a Mental Health or [place] or need someone to talk to, please call the Suicide and Crisis Lifeline: 988 call the [place] National Suicide Prevention Lifeline: 4782655109 or TTY: 918-161-3072 TTY 310-881-1021) to talk to a trained counselor call 1-800-273-TALK (toll free, 24 hour hotline) go to [facility] [address], [place] [phone]) call the [place]:  [phone] call 911   Patient verbalizes understanding of instructions and care plan provided today and agrees to view in [place]. Active MyChart status confirmed with patient.    [place] Licensed Clinical Social Worker [facility] [employer] [phone]

## 2021-05-21 ENCOUNTER — Telehealth: Payer: Self-pay

## 2021-05-21 NOTE — Telephone Encounter (Signed)
° °  Telephone encounter was:  Successful.  05/21/2021 Name: GLEE LASHOMB MRN: [id number] DOB: [birth year]  Veronica Wolf is a 81 y.o. year old female who is a primary care patient of [name], NP . The community resource team was consulted for assistance with  meals on wheels  Care guide performed the following interventions: Spoke with Rosaland Lao at [employer] he has received the [facility] referral and will call the patient this week. Spoke with patient to let her know to expect a phone call from [place].   Follow Up Plan:  No further follow up planned at this time. The patient has been provided with needed resources.  Cristina Ceniceros, AAS Paralegal, [facility] Care Guide  Embedded Care Coordination [place]   Care Management  [address] [place], [place] [address] ??[email or web address]   ?? [phone]   [email or web address]

## 2021-05-25 ENCOUNTER — Telehealth: Payer: Medicare HMO

## 2021-05-27 ENCOUNTER — Ambulatory Visit: Payer: Medicare HMO

## 2021-05-27 ENCOUNTER — Telehealth: Payer: Self-pay | Admitting: Family

## 2021-05-27 NOTE — Telephone Encounter (Signed)
Pt had to cancel appointment tomorrow with social worker, as she is not feeling well. This will need to be rescheduled as soon as possible.

## 2021-05-28 ENCOUNTER — Telehealth: Payer: Medicare HMO

## 2021-05-29 ENCOUNTER — Telehealth: Payer: Self-pay

## 2021-05-29 ENCOUNTER — Ambulatory Visit: Payer: Medicare HMO | Admitting: *Deleted

## 2021-05-29 DIAGNOSIS — F4321 Adjustment disorder with depressed mood: Secondary | ICD-10-CM

## 2021-05-29 DIAGNOSIS — F418 Other specified anxiety disorders: Secondary | ICD-10-CM

## 2021-05-29 DIAGNOSIS — M109 Gout, unspecified: Secondary | ICD-10-CM

## 2021-05-29 DIAGNOSIS — I2510 Atherosclerotic heart disease of native coronary artery without angina pectoris: Secondary | ICD-10-CM

## 2021-05-29 DIAGNOSIS — F419 Anxiety disorder, unspecified: Secondary | ICD-10-CM

## 2021-05-29 DIAGNOSIS — J449 Chronic obstructive pulmonary disease, unspecified: Secondary | ICD-10-CM

## 2021-05-29 DIAGNOSIS — E782 Mixed hyperlipidemia: Secondary | ICD-10-CM

## 2021-05-29 DIAGNOSIS — J441 Chronic obstructive pulmonary disease with (acute) exacerbation: Secondary | ICD-10-CM

## 2021-05-29 DIAGNOSIS — N189 Chronic kidney disease, unspecified: Secondary | ICD-10-CM

## 2021-05-29 DIAGNOSIS — R1310 Dysphagia, unspecified: Secondary | ICD-10-CM

## 2021-05-29 NOTE — Telephone Encounter (Signed)
° °  Telephone encounter was:  Successful.  05/29/2021 Name: Veronica Wolf MRN: [id number] DOB: 1940/09/16  Veronica Wolf is a 81 y.o. year old female who is a primary care patient of [name], NP . The community resource team was consulted for assistance with  Senior activities  Care guide performed the following interventions:  Initial outreach was on 1/26 however, call did not save in the documentation. Pt advised she would like all information sent out by mail. I advised pt of mail that would sent out. [employer] [facility], Teacher, early years/pre center, [employer] such as Investment banker, operational Lifestyle center. I also sent Seniors Helping seniors information and [place] well connected program .  Follow Up Plan:  Care guide will follow up with patient by phone over the next few days to ensure she received mail.  [facility] Guide, Embedded Care Coordination [facility]  [place], [place] [address]  Main Phone: [phone]   E-mail: [email or web address]  Website: [email or web address]

## 2021-05-29 NOTE — Patient Instructions (Signed)
Visit Information  Thank you for taking time to visit with me today. Please don't hesitate to contact me if I can be of assistance to you before our next scheduled telephone appointment.  Following are the goals we discussed today:  Patient Goals/Self-Care Activities: Continue to receive personal counseling with LCSW, on a bi-weekly basis, to reduce and manage symptoms of Depression, Anxiety, and Grief, until well-established with a community mental health provider. Confirmed receipt of counseling agencies and resources mailed to your home, and thorough review of available community mental health providers. Begin contacting community mental health agencies and providers of interest, from the following list provided, in an effort to establish ongoing counseling and supportive services:   ~ Mental Health Resources in [facility] ~ List of Therapists and Psychiatrists in [place], [place] ~ List of Female Therapists in [place] that [employer] Medicare Continue to incorporate into daily practice - relaxation techniques, deep breathing exercises and mindfulness meditation strategies. Contact LCSW directly (# [phone]), if you have questions, need assistance, or if additional social work needs are identified between now and our next scheduled telephone outreach call. Follow-Up Date:  06/15/2021 at 2:00 pm  Please call the care guide team at [phone] if you need to cancel or reschedule your appointment.   If you are experiencing a Mental Health or Behavioral Health Crisis or need someone to talk to, please call the Suicide and Crisis Lifeline: 988 call the [place] National Suicide Prevention Lifeline: 808 439 9064 or TTY: (435)139-0664 TTY 4235630232) to talk to a trained counselor call 1-800-273-TALK (toll free, 24 hour hotline) go to [facility] [address] [place] [phone]) call the [facility] Crisis Line: [phone] call 911    Patient verbalizes understanding of instructions and care plan provided today and agrees to view in MyChart. Active MyChart status confirmed with patient.    Danford Bad LCSW Licensed Clinical Social Worker [facility] [employer] [phone]

## 2021-05-29 NOTE — Chronic Care Management (AMB) (Signed)
Chronic Care Management    Clinical Social Work Note  05/29/2021 Name: Veronica Wolf MRN: [id number] DOB: May 06, 1940  Veronica Wolf is a 81 y.o. year old female who is a primary care patient of Debbrah Alar, NP. The CCM team was consulted to assist the patient with chronic disease management and/or care coordination needs related to: Appointment Scheduling Needs, Community Resources, Grief Counseling, and Mental Health Counseling and Resources.   Engaged with patient by telephone for follow up visit in response to provider referral for social work chronic care management and care coordination services.   Consent to Services:  The patient was given information about Chronic Care Management services, agreed to services, and gave verbal consent prior to initiation of services.  Please see initial visit note for detailed documentation.   Patient agreed to services and consent obtained.   Assessment: Review of patient past medical history, allergies, medications, and health status, including review of relevant consultants reports was performed today as part of a comprehensive evaluation and provision of chronic care management and care coordination services.     SDOH (Social Determinants of Health) assessments and interventions performed:    Advanced Directives Status: Not addressed in this encounter.  CCM Care Plan  Allergies  Allergen Reactions   Ibuprofen Other (See Comments)    Pt has hx of peptic ulcer and diverticulitis.     Outpatient Encounter Medications as of 05/29/2021  Medication Sig Note   albuterol (PROVENTIL) (2.5 MG/3ML) 0.083% nebulizer solution USE 1 VIAL IN NEBULIZER EVERY 6 HOURS AS NEEDED FOR WHEEZING AND FOR SHORTNESS OF BREATH    allopurinol (ZYLOPRIM) 100 MG tablet Take 2 tablets (200 mg total) by mouth daily.    aspirin 81 MG EC tablet Take 1 tablet (81 mg total) by mouth daily. Swallow whole. 05/07/2021: Patient has ran out but will purchase OTC    atorvastatin (LIPITOR) 80 MG tablet Take 1 tablet (80 mg total) by mouth daily.    benzonatate (TESSALON) 100 MG capsule Take 1 capsule (100 mg total) by mouth every 8 (eight) hours. (Patient not taking: Reported on 05/07/2021)    colchicine 0.6 MG tablet Take 0.6 mg by mouth daily as needed (gout). (Patient not taking: Reported on 05/07/2021)    diazepam (VALIUM) 10 MG tablet TAKE ONE TABLET BY MOUTH EVERY TWELVE HOURS AS NEEDED FOR ANXIETY    dicyclomine (BENTYL) 10 MG capsule Take 1 capsule (10 mg total) by mouth in the morning, at noon, in the evening, and at bedtime.    escitalopram (LEXAPRO) 20 MG tablet Take 1 tablet (20 mg total) by mouth daily.    ezetimibe (ZETIA) 10 MG tablet Take 1 tablet (10 mg total) by mouth daily.    famotidine (PEPCID) 20 MG tablet Take 1 tablet (20 mg total) by mouth 2 (two) times daily. In the morning and at bedtime 05/19/2021: Reports takes as needed   isosorbide mononitrate (IMDUR) 30 MG 24 hr tablet Take 1 tablet (30 mg total) by mouth daily.    levothyroxine (SYNTHROID) 25 MCG tablet TAKE ONE TABLET BY MOUTH EVERY MORNING    lisinopril (ZESTRIL) 2.5 MG tablet Take 1 tablet (2.5 mg total) by mouth daily.    meclizine (ANTIVERT) 25 MG tablet TAKE ONE TABLET BY MOUTH twive daily AS NEEDED FOR dizziness    metoprolol succinate (TOPROL-XL) 25 MG 24 hr tablet Take 0.5 tablets (12.5 mg total) by mouth daily.    nitroGLYCERIN (NITROSTAT) 0.4 MG SL tablet Place 1 tablet (0.4 mg  total) under the tongue every 5 (five) minutes as needed for chest pain.    nystatin (MYCOSTATIN/NYSTOP) powder APPLY POWDER TOPICALLY TO AFFECTED AREA 2 TIMES DAILY    pantoprazole (PROTONIX) 40 MG tablet Take 1 tablet (40 mg total) by mouth 2 (two) times daily.    potassium chloride SA (KLOR-CON M) 20 MEQ tablet TAKE ONE TABLET BY MOUTH AT NOON    sucralfate (CARAFATE) 1 g tablet TAKE ONE TABLET BY MOUTH FOUR TIMES DAILY. MAY cut pill in half AND DISSOLVE in water prior TO drinking    No  facility-administered encounter medications on file as of 05/29/2021.    Patient Active Problem List   Diagnosis Date Noted   Cough 04/21/2021   Left otitis media 02/16/2021   Back pain 12/03/2020   Acute cystitis without hematuria 12/03/2020   Grief reaction 12/03/2020   Fatty liver 12/01/2020   Intermittent left-sided chest pain 09/17/2020   Coronary artery disease involving native coronary artery of native heart [id number]   Metabolic syndrome [id number]   Prediabetes 09/17/2020   Allergy 09/16/2020   Anxiety 09/16/2020   Hypertension 09/16/2020   Vertigo 09/16/2020   Dark stools 09/08/2020   Dysphagia    Esophageal stricture    Hiatal hernia    Epigastric pain    Hypothyroid 01/25/2018   Mild CAD 10/03/2017   Stress-induced cardiomyopathy 09/19/2017   COPD exacerbation ([place]) 09/14/2017   Hypokalemia 09/14/2017   Non-ST elevation (NSTEMI) myocardial infarction [facility]) - due to Demand Infarction 09/14/2017   CKD (chronic kidney disease) 04/04/2017   HLD (hyperlipidemia)    Allergic rhinitis    Gout 02/28/2017   Depression with anxiety 02/28/2017   Epigastric abdominal pain 12/22/2016   COPD with chronic bronchitis ([place]) 12/22/2016   GERD (gastroesophageal reflux disease) 12/22/2016   DOE (dyspnea on exertion) 12/22/2016   Peptic ulcer 01/04/2003    Conditions to be addressed/monitored: Anxiety, Depression, and Grief.  Mental Health Concerns and Lacks Knowledge of [employer].  Care Plan : LCSW Plan of Care  Updates made by Francis Gaines, LCSW since 05/29/2021 12:00 AM     Problem: Reduce and Manage My Symptoms of Depression, Anxiety, and Grief.   Priority: High     Goal: Reduce and Manage My Symptoms of Depression, Anxiety, and Grief.   Start Date: 05/13/2021  Expected End Date: 08/10/2021  This Visit's Progress: On track  Recent Progress: On track  Priority: High  Note:   Current Barriers:   Patient with Acute Mental Health Needs related to  Depression with Anxiety, Generalized Anxiety Disorder, and Grief Reaction, requires Support, Education, Resources, Referrals, Advocacy, and Care Coordination, in order to meet Unmet Acute Mental Health Needs. Patient lacks knowledge of available community counseling agencies and resources. Clinical Goal(s):  Patient will work with LCSW to reduce and manage symptoms of Depression, Anxiety and Grief, until well-established with a community provider.   Patient will increase knowledge and/or ability of:        Coping Skills, Healthy Habits, Self-Management Skills, Stress Reduction, Home Safety and Utilizing [employer] and Resources.   Interventions:  Collaboration with Primary Care Provider, Nurse Practitioner, Debbrah Alar regarding development and update of comprehensive plan of care as evidenced by provider attestation and co-signature. Inter-disciplinary care team collaboration (see longitudinal plan of care). Clinical Interventions: Mindfulness Meditation Strategies, Relaxation Techniques, and Deep Breathing Exercises Reviewed and Reinforced Daily. Verbalization of Feelings Encouraged, and Emotional Support Provided. Problem Solving/Task-Centered Solutions Developed. Motivational Interviewing Performed. Client-Centered Therapy Initiated. Patient Goals/Self-Care  Activities: Continue to receive personal counseling with LCSW, on a bi-weekly basis, to reduce and manage symptoms of Depression, Anxiety, and Grief, until well-established with a community mental health provider. Confirmed receipt of counseling agencies and resources mailed to your home, and thorough review of available community mental health providers. Begin contacting community mental health agencies and providers of interest, from the following list provided, in an effort to establish ongoing counseling and supportive services:   ~ [place] in [facility] ~ List of Therapists and Psychiatrists in  [place], [place] ~ List of Female Therapists in [place] that [employer] Medicare Continue to incorporate into daily practice - relaxation techniques, deep breathing exercises and mindfulness meditation strategies. Contact LCSW directly (# [phone]), if you have questions, need assistance, or if additional social work needs are identified between now and our next scheduled telephone outreach call. Follow-Up Date:  06/15/2021 at 2:00 pm     Nat Christen [place] Clinical Social Worker [place] [phone]

## 2021-06-02 DIAGNOSIS — J449 Chronic obstructive pulmonary disease, unspecified: Secondary | ICD-10-CM

## 2021-06-02 DIAGNOSIS — I10 Essential (primary) hypertension: Secondary | ICD-10-CM | POA: Diagnosis not present

## 2021-06-03 ENCOUNTER — Ambulatory Visit (INDEPENDENT_AMBULATORY_CARE_PROVIDER_SITE_OTHER): Payer: Medicare HMO | Admitting: Pharmacist

## 2021-06-03 ENCOUNTER — Ambulatory Visit: Payer: Medicare HMO

## 2021-06-03 DIAGNOSIS — I2510 Atherosclerotic heart disease of native coronary artery without angina pectoris: Secondary | ICD-10-CM

## 2021-06-03 DIAGNOSIS — I10 Essential (primary) hypertension: Secondary | ICD-10-CM

## 2021-06-03 DIAGNOSIS — J449 Chronic obstructive pulmonary disease, unspecified: Secondary | ICD-10-CM

## 2021-06-03 DIAGNOSIS — E785 Hyperlipidemia, unspecified: Secondary | ICD-10-CM

## 2021-06-03 DIAGNOSIS — E782 Mixed hyperlipidemia: Secondary | ICD-10-CM

## 2021-06-03 DIAGNOSIS — Z79899 Other long term (current) drug therapy: Secondary | ICD-10-CM

## 2021-06-04 NOTE — Chronic Care Management (AMB) (Signed)
Chronic Care Management Pharmacy Note  06/04/2021 Name:  Veronica Wolf MRN:  [id number] DOB:  [birth year]  Summary: She has received first delivery from [facility] and very confused about how to take her medications. Met with patient in her home today to assist in filling weekly pill containers for 2 weeks.  Restart packaging from [facility] - will be able to restart around 06/17/2021.  Also discussed [employer] over-the-counter benefits - provided catalog and instruction on how to use her $50 per quarter allowance. Helped her to find blood pressure cuff in catalog.  Discussed adherence and provided tips to help her remember night times meds - alarm, place near bed or by toothbrush. (She missed 3 evening doses in the last 2 weeks)   Subjective: Veronica Wolf is an 81 y.o. year old female who is a primary patient of Debbrah Alar, NP.  The CCM team was consulted for assistance with disease management and care coordination needs.    Engaged with patient face to face for follow up visit in response to provider referral for pharmacy case management and/or care coordination services.   Consent to Services:  The patient was given information about Chronic Care Management services, agreed to services, and gave verbal consent prior to initiation of services.  Please see initial visit note for detailed documentation.   Patient Care Team: Debbrah Alar, NP as PCP - General (Internal Medicine) Revankar, Reita Cliche, MD as PCP - Cardiology (Cardiology) Cherre Robins, Nathalie (Pharmacist) Luretha Rued, RN as Case Manager Saporito, Maree Erie, LCSW as Social Worker (Licensed Clinical Social Worker)  Recent office visits: 04/21/2021 - Internal med Inda Castle) Phone visit for cough, shortness of breath and wheezing. Patient encouraged to go to ED.  02/16/2021 - Phone Call  to PCP office regarding rash under her breast and stomach. Debbrah Alar sent in prescription for nystatin powder.   02/16/2021 - Internal Med Inda Castle) F/U anxiety and stomach pain. Referred to GI for abdominal pain. UDS ordered. BP noted to be low - no medication changes. Continue to minitor BP.  01/09/2021 - PCP Inda Castle, NP) Seen for epigatric / chest pain. Labs ordered - CBC, BMP and lipase. Also ordered CT abdomin. No med changes noted.  12/03/2020- PCP Inda Castle, NP) Seen for back pain. Prescribed Medrol dose pack and ordered Xay. Also checked urinalysis and recommended she complete course of ciprofloxacin given while she was in [place].  09/16/2020 - PCP Phone Call. Ultrasound showed fatty liver. Recommended follow up with GI.  09/08/2020 - PCP Inda Castle) Seen for abdominal pain and diarrhea. Hemoccult negative. Reordered Abdominal CT, LFTs and lipase, CBC. No medication changes  Recent consult visits: 09/17/2020 - Cardio (Dr Harriet Masson) CAD / intermittant chest pain. Started low dose Imdue $RemoveBe'15mg'cikAOerVl$  daily and ezetimibe $RemoveBefor'10mg'bzzjEkEZTuoM$  daily. Ordered nucular stress test   Hospital visits: 04/21/2021 ED Visit at [facility]. URI leading to COPD exacerbation. Prescribed doxycycline, Augmentin, prednisone and benzonatate at discharge.    Objective:  Lab Results  Component Value Date   CREATININE 1.63 (H) 04/21/2021   CREATININE 1.40 (H) 01/09/2021   CREATININE 1.38 (H) 08/18/2020    Lab Results  Component Value Date   HGBA1C 6.5 01/09/2021   Last diabetic Eye exam: No results found for: HMDIABEYEEXA  Last diabetic Foot exam: No results found for: HMDIABFOOTEX      Component Value Date/Time   CHOL 204 (H) 08/18/2020 1203   TRIG 182.0 (H) 08/18/2020 1203   HDL 42.30 08/18/2020 1203   CHOLHDL 5  08/18/2020 1203   VLDL 36.4 08/18/2020 1203   LDLCALC 125 (H) 08/18/2020 1203   LDLCALC 108 (H) 03/14/2020 1439    Hepatic Function Latest Ref Rng & Units 04/21/2021 01/09/2021 09/08/2020  Total Protein 6.5 - 8.1 g/dL 6.6 6.7 6.3  Albumin 3.5 - 5.0 g/dL 3.1(L) 3.8 3.6  AST 15 - 41 U/L 14(L) 14 10   ALT 0 - 44 U/L $Remo'10 6 5  'NXkIN$ Alk Phosphatase 38 - 126 U/L 70 82 64  Total Bilirubin 0.3 - 1.2 mg/dL 0.5 0.3 0.4  Bilirubin, Direct 0.0 - 0.2 mg/dL 0.1 - 0.1    Lab Results  Component Value Date/Time   TSH 3.57 08/18/2020 12:03 PM   TSH 1.77 03/14/2020 02:39 PM   FREET4 0.85 01/24/2018 03:04 PM    CBC Latest Ref Rng & Units 04/21/2021 09/08/2020 06/11/2020  WBC 4.0 - 10.5 K/uL 16.2(H) 7.3 9.1  Hemoglobin 12.0 - 15.0 g/dL 11.3(L) 11.6(L) 11.5(L)  Hematocrit 36.0 - 46.0 % 36.1 36.1 38.1  Platelets 150 - 400 K/uL 210 219.0 219    No results found for: VD25OH  Clinical ASCVD: Yes  The ASCVD Risk score (Arnett DK, et al., 2019) failed to calculate for the following reasons:   The 2019 ASCVD risk score is only valid for ages 66 to 13   The patient has a prior MI or stroke diagnosis    Other:  DEXA 03/05/2014             AP LUMBAR SPINE not used for calculation due to significant degenerative changes.             Left FOREARM (1/3 RADIUS) T Score: -0.4             Left FEMUR neck T-Score: -0.7     Social History   Tobacco Use  Smoking Status Former   Types: Cigarettes   Quit date: 04/24/2000   Years since quitting: 21.1   Passive exposure: Past  Smokeless Tobacco Never   BP Readings from Last 3 Encounters:  04/21/21 (!) 108/59  02/16/21 (!) 102/56  01/09/21 (!) 143/93   Pulse Readings from Last 3 Encounters:  04/21/21 76  02/16/21 82  01/09/21 88   Wt Readings from Last 3 Encounters:  04/13/21 182 lb (82.6 kg)  02/16/21 182 lb (82.6 kg)  01/09/21 189 lb (85.7 kg)    Assessment: Review of patient past medical history, allergies, medications, health status, including review of consultants reports, laboratory and other test data, was performed as part of comprehensive evaluation and provision of chronic care management services.   SDOH:  (Social Determinants of Health) assessments and interventions performed:      CCM Care Plan  Allergies  Allergen Reactions    Ibuprofen Other (See Comments)    Pt has hx of peptic ulcer and diverticulitis.     Medications Reviewed Today     Reviewed by Cherre Robins, RPH-CPP (Pharmacist) on 06/04/21 at 1223  Med List Status: <None>   Medication Order Taking? Sig Documenting Provider Last Dose Status Informant  albuterol (PROVENTIL) (2.5 MG/3ML) 0.083% nebulizer solution [id number] Yes USE 1 VIAL IN NEBULIZER EVERY 6 HOURS AS NEEDED FOR WHEEZING AND FOR SHORTNESS OF [name], Lenna Sciara, NP Taking Active   allopurinol (ZYLOPRIM) 100 MG tablet [id number] Yes Take 2 tablets (200 mg total) by mouth daily. Debbrah Alar, NP Taking Active   aspirin 81 MG EC tablet [id number] Yes Take 1 tablet (81 mg total) by mouth daily. Swallow whole. Berniece Salines,  DO Taking Active            Med Note Antony Contras, Cecil Bixby B   Thu May 07, 2021  8:01 AM) Patient has ran out but will purchase OTC  atorvastatin (LIPITOR) 80 MG tablet [id number] Yes Take 1 tablet (80 mg total) by mouth daily. Debbrah Alar, NP Taking Active   colchicine 0.6 MG tablet [id number] Yes Take 0.6 mg by mouth daily as needed (gout). [provider] Taking Active   diazepam (VALIUM) 10 MG tablet [id number] Yes TAKE ONE TABLET BY MOUTH EVERY TWELVE HOURS AS NEEDED FOR ANXIETY Debbrah Alar, NP Taking Active   dicyclomine (BENTYL) 10 MG capsule [id number] Yes Take 1 capsule (10 mg total) by mouth in the morning, at noon, in the evening, and at bedtime. Debbrah Alar, NP Taking Active   escitalopram (LEXAPRO) 20 MG tablet [id number] Yes Take 1 tablet (20 mg total) by mouth daily. Debbrah Alar, NP Taking Active   ezetimibe (ZETIA) 10 MG tablet [id number] Yes Take 1 tablet (10 mg total) by mouth daily. Debbrah Alar, NP Taking Active   famotidine (PEPCID) 20 MG tablet [id number] Yes Take 1 tablet (20 mg total) by mouth 2 (two) times daily. In the morning and at bedtime Colon Branch, MD Taking Active            Med Note [facility], Adria Dill Jun 04, 2021 12:23 PM)    isosorbide mononitrate (IMDUR) 30 MG 24 hr tablet [id number] Yes Take 1 tablet (30 mg total) by mouth daily. Mosie Lukes, MD Taking Active   levothyroxine (SYNTHROID) 25 MCG tablet [id number] Yes TAKE ONE TABLET BY MOUTH EVERY MORNING Debbrah Alar, NP Taking Active   lisinopril (ZESTRIL) 2.5 MG tablet [id number] Yes Take 1 tablet (2.5 mg total) by mouth daily. Debbrah Alar, NP Taking Active   meclizine (ANTIVERT) 25 MG tablet [id number] Yes TAKE ONE TABLET BY MOUTH twive daily AS NEEDED FOR dizziness Debbrah Alar, NP Taking Active   metoprolol succinate (TOPROL-XL) 25 MG 24 hr tablet [id number] Yes Take 0.5 tablets (12.5 mg total) by mouth daily. Debbrah Alar, NP Taking Active   nitroGLYCERIN (NITROSTAT) 0.4 MG SL tablet [id number] Yes Place 1 tablet (0.4 mg total) under the tongue every 5 (five) minutes as needed for chest pain. Berniece Salines, DO Taking Active   nystatin (MYCOSTATIN/NYSTOP) powder [id number] No APPLY POWDER TOPICALLY TO AFFECTED AREA 2 TIMES DAILY  Patient not taking: Reported on 06/04/2021   Debbrah Alar, NP Not Taking Active   pantoprazole (PROTONIX) 40 MG tablet [id number] Yes Take 1 tablet (40 mg total) by mouth 2 (two) times daily. Debbrah Alar, NP Taking Active   potassium chloride SA (KLOR-CON M) 20 MEQ tablet [id number] Yes TAKE ONE TABLET BY MOUTH AT Guerry Bruin, Lenna Sciara, NP Taking Active   sucralfate (CARAFATE) 1 g tablet [id number] Yes TAKE ONE TABLET BY MOUTH FOUR TIMES DAILY. MAY cut pill in half AND DISSOLVE in water prior TO drinking Debbrah Alar, NP Taking Active             Patient Active Problem List   Diagnosis Date Noted   Cough 04/21/2021   Left otitis media 02/16/2021   Back pain 12/03/2020   Acute cystitis without hematuria 12/03/2020   Grief reaction 12/03/2020   Fatty liver 12/01/2020   Intermittent left-sided chest pain 09/17/2020   Coronary artery disease involving native  coronary artery of native heart 05/16/1550   Metabolic syndrome 11/05/2334   Prediabetes 09/17/2020   Allergy  09/16/2020   Anxiety 09/16/2020   Hypertension 09/16/2020   Vertigo 09/16/2020   Dark stools 09/08/2020   Dysphagia    Esophageal stricture    Hiatal hernia    Epigastric pain    Hypothyroid 01/25/2018   Mild CAD 10/03/2017   Stress-induced cardiomyopathy 09/19/2017   COPD exacerbation ([place]) 09/14/2017   Hypokalemia 09/14/2017   Non-ST elevation (NSTEMI) myocardial infarction [facility]) - due to Demand Infarction 09/14/2017   CKD (chronic kidney disease) 04/04/2017   HLD (hyperlipidemia)    Allergic rhinitis    Gout 02/28/2017   Depression with anxiety 02/28/2017   Epigastric abdominal pain 12/22/2016   COPD with chronic bronchitis ([place]) 12/22/2016   GERD (gastroesophageal reflux disease) 12/22/2016   DOE (dyspnea on exertion) 12/22/2016   Peptic ulcer 01/04/2003    Immunization History  Administered Date(s) Administered   Fluad Quad(high Dose 65+) 03/14/2020, 12/03/2020   Influenza, High Dose Seasonal PF 01/23/2018, 12/07/2018   Influenza-Unspecified 12/23/2018   PFIZER(Purple Top)SARS-COV-2 Vaccination 05/14/2019, 06/08/2019   Pneumococcal Conjugate-13 07/16/2015   Pneumococcal Polysaccharide-23 12/23/2016   Tdap 09/02/2009, 04/05/2017    Conditions to be addressed/monitored: CAD, HTN, HLD, COPD, Anxiety, Depression and gout, GERD ; hypothyroidism  Care Plan : General Pharmacy (Adult)  Updates made by Cherre Robins, RPH-CPP since 06/04/2021 12:00 AM     Problem: Medication management and Monitoring and Coordination of Care for Chronic Conditions   Priority: High  Onset Date: 09/02/2020     Long-Range Goal: Medication management   Start Date: 09/02/2020  Recent Progress: On track  Priority: High  Note:    Current Barriers:  Unable to achieve control of hyperlipidemia  Does not adhere to prescribed medication regimen Does not maintain contact with provider  office Sometimes forgets to take night time dose of medications   Pharmacist Clinical Goal(s):  Over the next 90 days, patient will achieve adherence to monitoring guidelines and medication adherence to achieve therapeutic efficacy achieve control of hyperlipidemia as evidenced by LDL < 70 adhere to prescribed medication regimen as evidenced by filling prescriptions on times contact provider office for questions/concerns as evidenced notation of same in electronic health record through collaboration with PharmD and provider.   Interventions: 1:1 collaboration with Debbrah Alar, NP regarding development and update of comprehensive plan of care as evidenced by provider attestation and co-signature Inter-disciplinary care team collaboration (see longitudinal plan of care) Comprehensive medication review performed; medication list updated in electronic medical record  Hypertension: Controlled though blood pressure was a little low at last office visit BP goal is:  <130/80  Patient is not checking BP at home - states she does not have BP cuff.  Patient has failed these meds in the past: None noted  Current Regimen: Metoprolol Succinate 25mg  0.5 tab daily Lisinopril 2.5mg  daily Interventions:  Reviewed signs and symptoms of low blood pressure to monitor for.  Discussed blood pressure  goal Continue current medications for blood pressure Verified patient has over-the-counter benefits of $50 per quarter with her [facility]  Plan. Provided patient with [employer] over-the-counter catalog and educated about how to order over-the-counter products.     Hyperlipidemia / history of MI: Not controlled; LDL goal < 70  Patient has failed these meds in past: None noted   In September 2022 patient reported she just stopped taking atorvastatin because she was taking so many other medications. She has been taking atrovastatin regularly since.  Current therapy Atorvastatin 80mg  daily (increased  08/18/2020) Ezetimibe 10mg  daily (added 09/17/2020)  Metoprolol Succinate  25mg  1/2 tab daily Aspirin 81mg  daily  Nitroglycerin 0.4mg  - place 1 tablet under tongue as needed for chest pain, may repeat in 5 minutes if chest pain not relieved. Interventions: (addressed at previous visit) Discussed LDL goal Discussed importance of taking atorvastatin daily to both lower cholesterol and prevent ASCVD / recurrent MI; Adherence has improved since she started adherence packaging. Consider rechecking lipids with next labs since has not been rechecked since atorvastatin increased and ezetimibe added Reminded patient to reschedule appointment with cardiologist.   Chronic Obstructive Pulmonary Disease: Controlled; last exacerbation due to upper respiratory infection 04/21/2021 Current treatment: Albuterol solution for nebulizer - use in nebulizer up to every 6 hours as needed for shortness of breath Interventions:  Monitor exacerbations; Determine if escalation in therapy is needed.  Depression/Anxiety:  Improved; goal: decrease use of diazepam and decrease number of days per month with symptoms of grief / depression Current treatment: Escitalopram 20mg  daily Diazepam 10mg  every 12 hours as needed  Patient has failed these meds in past: citalopram (ineffective) Patient has been taking diazepam less recently. Only using 1 or 2 times per week when she has difficulty sleeping.  Reports her mood has been stable the last 2 months. She still grieves the loss of her brother in August 2022 but states grief has improved over the last 3 to 4 months. Her brother was like a father to her.  Interventions:  Discussed adherence and importance of taking escitalopram for depression / anxiety Encouraged patient to continue to use diazepam sparingly - only at night if needed.  Consider grief counseling (patient continues to decline). Patient will continue consults with Care Coordination LCSW  Hypothyroidism:   Patient is currently controlled on the following medications:  levothyroxine 42mcg daily  TSH was WNL at last check on 08/18/2020 Patient has failed these meds in past: None noted  Intervention:  Continue current therapy   Gout:   Goal Uric Acid < 6 mg/dL   Last uric acid was not at goal (UA = 8.4 on 03/14/2020) but patient denies symptoms of gout at this time.  Current medications:  Allopurinol 100mg  - take 2 tablets = 200mg  daily Colchicine 0.6mg  up to bid as needed for acute gout Medications that may increase uric acid levels: Aspirin Last gout flare: Feb 2022 Patient has failed these meds in past: None noted   Interventions:  Reviewed preventative versus treatment medications for gout.  Continue current therapy Consider checking Uric acid with next labs.    Medication management:   She started medication adherence packaging 01/08/2021 and really liked it. Has helped her with remembering to take her medication on time. However she changed to [facility] for 2023 and thought she would try using their mailorder phamacy [facility] not realizing that [facility] would not package her medications in daily pill packs.  She received a delivery in January 2023 with 90 day supply of most maintenance medication and she states she felt overwelmed and not sure that she will be able take them as prescribed without help. She states her daughter and grandson are not able to assist.  Monday 04/27/2021 coordinated with [facility] to get the following needed med filled and delivered - allopurinol, isosorbide, levothyroxine and famotidine.   Discussed with [facility] and the soonest she can get packaged medications will be around 06/12/2021 (will plan to start new packaging 06/17/2021). I helped patient today fill 2 weeks of medication in her weekly containers. This will last until she receives packaging again.   Current pharmacy:  [facility] Intervention:  Comprehensive medication review completed.    Coordinated  with [facility] to fill allopurinol, isosorbide, levothyroxine and famotidine and deliver to patient 04/28/2021 Met with patient and assisted her in filling weekly pill container for 2 weeks today.   Education provided about how to order over-the-counter medication from [facility].  Discussed tips to help her remember to take nighttime medications more consistently - set alarm on cell phone, place medications be bedside or with toothbrush.    Patient Goals/Self-Care Activities Over the next 90 days, patient will:  take medications as prescribed focus on medication adherence by filing prescriptions at one pharmacy and using adherence packaging Consider ordering blood pressure cuff with [employer] over-the-counter benefits.   Discussed tips to help her remember to take nighttime medications more consistently - set alarm on cell phone, place medications be bedside or with toothbrush.   Follow Up Plan: Telephone follow up appointment with care management team member scheduled for:  1 month         Medication Assistance: None required.  Patient affirms current coverage meets needs.  Patient's preferred pharmacy is:  [facility], [address] [place] [address] Phone: [phone] Fax: [phone]  [facility] - [place], [place] - [address] [place] [address] Phone: [phone] Fax: [phone]   Follow Up:  Patient agrees to Care Plan and Follow-up.  Plan: follow up in 2 weeks with phone visit  Cherre Robins, PharmD Clinical Pharmacist [place] [phone]

## 2021-06-04 NOTE — Patient Instructions (Signed)
Mrs. Marshman,  ?It was a pleasure speaking with you today.  ?I have attached a summary of our visit today and information about your health goals.  ? ? ?Patient Goals/Self-Care Activities ?Over the next 90 days, patient will:  ?take medications as prescribed ?focus on medication adherence by filing prescriptions at one pharmacy and using adherence packaging ?Consider ordering blood pressure cuff with [employer] over-the-counter benefits.  ? Discussed tips to help her remember to take nighttime medications more consistently - set alarm on cell phone, place medications be bedside or with toothbrush.  ? ? ?If you have any questions or concerns, please feel free to contact me either at the phone number below or with a MyChart message.  ? ?Keep up the good work! ? ?[name], PharmD ?Clinical Pharmacist ?[facility] ?[facility] ?[phone] (direct line)  ?[phone] (main office number) ? ? ? ? ?Chronic Care Management Care Plan (Updated 06/03/2021) ?Hypertension Screening ?BP Readings from Last 3 Encounters:  ?04/21/21 (!) 108/59  ?02/16/21 (!) 102/56  ?01/09/21 (!) 143/93  ? ?Pharmacist Clinical Goal(s): ?Over the next 90 days, patient will work with PharmD and providers to maintain BP goal <130/80 ?Current regimen:  ?Metoprolol Succinate 25mg  0.5 tab daily ?Lisinopril 2.5mg  daily ?Interventions: ?Discussed blood pressure goal ?Discussed importance of taking metoprolol and lisinopril for heart health ?Patient self care activities - Over the next 90 days, patient will: ?Maintain blood pressure less than 130/80 ?Consider purchasing blood pressure cuff and check blood pressure 2 to 3 times per week and record ?Continue current medications   ?Order blood pressure cuff with [employer] over-the-counter benefits ? ?Hyperlipidemia / Heart health ?Lab Results  ?Component Value Date/Time  ? LDLCALC 125 (H) 08/18/2020 12:03 PM  ? LDLCALC 108 (H) 03/14/2020 02:39 PM  ? ?Pharmacist Clinical Goal(s): ?Over the  next 90 days, patient will work with PharmD and providers to achieve LDL goal < 70 ?Current regimen:  ?Atorvastatin 80mg  daily  ?Ezetimibe 10mg  daily ?Aspirin 81mg  daily  ?Metoprolol succinate ER 25mg  - take 0.5 tablet daily ?Nitroglycerin 0.4mg  - place 1 tablet under the tongue as needed for chest pain, may repeat in 5 minutes if chest pain not relieved.  ?Interventions: ?Discussed LDL goal and importance of medication adherence ?Repeat LDL at next office visit since ezetimibe started ?Discussed adherence ?Patient self care activities - Over the next 90 days, patient will: ?Continue medications currently prescribed to lower cholesterol and to prevent heart attack ? ?Anxiety / Depression:  ?Pharmacist Clinical Goal(s): ?Over the next 45 days, patient will work with PharmD and providers to maintain control of anxiety and depression symptoms ?Current regimen:  ?Escitalopram 20mg  daily  ?Diazepam 10mg  take 1 tablet every 12 hours if needed for anxiety ?Interventions: ?Discussed using diazepam only if needed. Limit use during daytime if able.  ?Patient self care activities - Over the next 90 days, patient will: ?Maintain current medication regimen for anxiety and depression  ?Remember to take diazepam only as needed.  ?Continue to follow up with social worker for grief counseling.  ? ?Gout:  ?Pharmacist Clinical Goal(s): ?Over the next 90 days, patient will work with PharmD and providers to prevent acute gout episodes / lower uric acid ?Current regimen:  ?Allopurinol 100mg  - take 2 tablets = 200mg  daily (prevention) ?Colchicine 0.6mg  take up to twice a day as needed for gout attack ?Interventions: ?Discussed difference between maintenance / presentation medication and treatment ?Patient self care activities - Over the next 90 days, patient will: ?Maintain current medication  regimen for gout ? ?Pre-Diabetes ?Lab Results  ?Component Value Date/Time  ? HGBA1C 6.5 01/09/2021 08:10 AM  ? HGBA1C 6.2 (H) 09/17/2020 10:38 AM   ? ?Pharmacist Clinical Goal(s): ?Over the next 90 days, patient will work with PharmD and providers to maintain A1c goal <6.5% ?Current regimen:  ?Diet and exercise management   ?Interventions: ?Discussed the importance of limiting carbohydrates (30-45 grams per meal) ?Patient self care activities - Over the next 90 days, patient will: ?Maintain a1c <6.5% ? ?Hypothyroidism ?Pharmacist Clinical Goal(s) ?Over the next 90 days, patient will work with PharmD and providers to maintain TSH within normal limits and reduce risk of symptoms associated with hypothyroidism ?Current regimen:  ?Euthyrox / levothyroxine daily ?Interventions: ?Discussed importance of medication adherence ?Reminded to separate Euthyrox / levothyroxine dose by 30 minutes from breakfast and morning medications ?Patient self care activities - Over the next 90 days, patient will: ?Maintain hypothyroidism medication regimen ? ?Medication management ?Pharmacist Clinical Goal(s): ?Over the next 90 days, patient will work with PharmD and providers to achieve optimal medication adherence ?Current pharmacy: [facility] ?Intervention:  ?Comprehensive medication review completed.  ?Coordinated packaging and refill for famotidine with pharmacy ?Will assist with transition back to [facility] and packaging ?Until patient is able to get packaging thru [facility] will assist with filling weekly pill container ?Patient self care activities - Over the next 90 days, patient will: ?Focus on medication adherence by filling and taking medications appropriately  ?Take medications as prescribed ?Report any questions or concerns to PharmD and/or provider(s) ?Contact [name], PharmD if any issues with medications  ? ? ?The patient verbalized understanding of instructions, educational materials, and care plan provided today and agreed to receive a mailed copy of patient instructions, educational materials, and care plan.   ?

## 2021-06-05 ENCOUNTER — Telehealth: Payer: Self-pay | Admitting: Family

## 2021-06-05 NOTE — Telephone Encounter (Signed)
Patient is calling to speak to Veronica Wolf regarding paperwork left at her house during Veronica Wolf's home visit. She would like to know if she is supposed to fill out paperwork and mailed it back over here. She would like a call back when able. Please advise.  ?

## 2021-06-05 NOTE — Telephone Encounter (Signed)
Patient was given catalog for over-the-counter benefits for [facility]. Explained how to order over-the-counter products from this catalog.  ? ?She also had questions about COVID tests she received from [facility]. She thought she had to test and send back to  [facility]. Explain to patient that these tests were for her to have on hand to test if she was having symptoms of COVID, suspected she had COVID or had been exposed to COVID. Patient voiced understanding.  ?

## 2021-06-06 ENCOUNTER — Other Ambulatory Visit: Payer: Self-pay | Admitting: Internal Medicine

## 2021-06-08 ENCOUNTER — Other Ambulatory Visit: Payer: Self-pay | Admitting: Family

## 2021-06-09 ENCOUNTER — Telehealth: Payer: Self-pay

## 2021-06-09 NOTE — Telephone Encounter (Signed)
? ?  Telephone encounter was:  Successful.  ?06/09/2021 ?Name: Veronica Wolf MRN: [id number] DOB: 08/03/1940 ? ?Veronica Wolf is a 81 y.o. year old female who is a primary care patient of [name], NP . The community resource team was consulted for assistance with  Senior Activities ? ?Care guide performed the following interventions:  Pt advised she received mail.I also provided [employer] phone number as pt stated she may need a ride. Pt advised she is currently going over all information sent and at this time she does not have any further questions or concerns.  ? ?Follow Up Plan:  No further follow up planned at this time. The patient has been provided with needed resources. Pt read back my number to me and advised she would call if she has any important questions. ? ?Hessie Knows ?Care Guide, Embedded Care Coordination ?[facility]  Care management  ?[place], [place] [address]  ?Main Phone: [phone]  E-mail: [email or web address]  ?Website: [email or web address] ? ? ? ?

## 2021-06-10 NOTE — Telephone Encounter (Signed)
Patient called stating that she is having nausea, vomiting and diarrhea. Patient reports she feels very week. Symptoms stated at 6am this morning. Her daughter is coming over to help. Recommended she try to take small sips of water to stay hydrated. Suggested she could [facility] if needed immediate assistance but will also forward to PCP to review.  ?

## 2021-06-10 NOTE — Telephone Encounter (Signed)
I would recommend that she go to the ED for IV fluids and further evaluation.  ?

## 2021-06-10 NOTE — Telephone Encounter (Signed)
Patient advised per provider, go to ED for in person evaluation and treatment. She verbalized understanding.  ?

## 2021-06-14 ENCOUNTER — Other Ambulatory Visit: Payer: Self-pay | Admitting: Family

## 2021-06-14 DIAGNOSIS — R42 Dizziness and giddiness: Secondary | ICD-10-CM

## 2021-06-15 ENCOUNTER — Telehealth: Payer: Self-pay | Admitting: *Deleted

## 2021-06-15 ENCOUNTER — Telehealth: Payer: Medicare HMO | Admitting: *Deleted

## 2021-06-15 NOTE — Telephone Encounter (Signed)
?  Care Management  ? ?Follow Up Note ? ? ?06/15/2021 ? ?Name: Veronica Wolf MRN: [id number] DOB: 12/26/1940 ? ?Referred By: [name], NP ? ?Reason for Referral: Chronic Care Management Needs in Patient with Hypertension, Hyperlipidemia, Type II Diabetes Mellitus, Depression with Anxiety, Grief Reaction, and Chronic Obstructive Pulmonary Disease.   ? ?An unsuccessful telephone outreach was attempted today. The patient was referred to the case management team for assistance with care management and care coordination. HIPAA compliant messages were left on voicemail, providing contact information, encouraging patient to return LCSW's call at her earliest convenience.  LCSW will make a second follow-up telephone outreach call attempt within the next 7-10 business days, if a return call is not received from patient in the meantime. ? ?Follow-Up Plan:  Request placed with Scheduling Care Guide to reschedule patient's follow-up telephone outreach call with LCSW. ? ?Danford Bad LCSW ?Licensed Clinical Social Worker ?[facility] ?[phone]   ?

## 2021-06-19 ENCOUNTER — Telehealth: Payer: Self-pay | Admitting: *Deleted

## 2021-06-19 NOTE — Chronic Care Management (AMB) (Signed)
?  Care Management  ? ?Note ? ?06/19/2021 ?Name: [name] MRN: [id number] DOB: January 18, 1941 ? ?Veronica Wolf is a 81 y.o. year old female who is a primary care patient of [name], NP and is actively engaged with the care management team. I reached out to [name] by phone today to assist with re-scheduling a follow up visit with the Licensed Clinical Social Worker ? ?Follow up plan: ?Unsuccessful telephone outreach attempt made. A HIPAA compliant phone message was left for the patient providing contact information and requesting a return call.  ? ?Chyrel Taha, CCMA ?Care Guide, Embedded Care Coordination ?[facility]  ?Direct Dial: [phone] ? ? ?

## 2021-06-22 ENCOUNTER — Ambulatory Visit: Payer: Medicare HMO

## 2021-06-22 DIAGNOSIS — I10 Essential (primary) hypertension: Secondary | ICD-10-CM

## 2021-06-22 DIAGNOSIS — J449 Chronic obstructive pulmonary disease, unspecified: Secondary | ICD-10-CM

## 2021-06-22 NOTE — Chronic Care Management (AMB) (Signed)
?Chronic Care Management  ? ?CCM RN Visit Note ? ?06/22/2021 ?Name: Veronica Wolf MRN: [id number] DOB: 13-May-1940 ? ?Subjective: ?Veronica Wolf is a 81 y.o. year old female who is a primary care patient of Veronica Craze, NP. The care management team was consulted for assistance with disease management and care coordination needs.   ? ?Engaged with patient by telephone for follow up visit in response to provider referral for case management and/or care coordination services.  ? ?Consent to Services:  ?The patient was given information about Chronic Care Management services, agreed to services, and gave verbal consent prior to initiation of services.  Please see initial visit note for detailed documentation.  ? ?Patient agreed to services and verbal consent obtained.  ? ?Assessment: Review of patient past medical history, allergies, medications, health status, including review of consultants reports, laboratory and other test data, was performed as part of comprehensive evaluation and provision of chronic care management services.  ? ?SDOH (Social Determinants of Health) assessments and interventions performed:   ? ?CCM Care Plan ? ?Allergies  ?Allergen Reactions  ? Ibuprofen Other (See Comments)  ?  Pt has hx of peptic ulcer and diverticulitis.   ? ? ?Outpatient Encounter Medications as of 06/22/2021  ?Medication Sig Note  ? albuterol (PROVENTIL) (2.5 MG/3ML) 0.083% nebulizer solution USE 1 VIAL IN NEBULIZER EVERY 6 HOURS AS NEEDED FOR WHEEZING AND FOR SHORTNESS OF BREATH   ? allopurinol (ZYLOPRIM) 100 MG tablet Take 2 tablets (200 mg total) by mouth daily.   ? atorvastatin (LIPITOR) 80 MG tablet Take 1 tablet (80 mg total) by mouth daily.   ? colchicine 0.6 MG tablet Take 0.6 mg by mouth daily as needed (gout).   ? diazepam (VALIUM) 10 MG tablet TAKE ONE TABLET BY MOUTH EVERY TWELVE HOURS AS NEEDED FOR ANXIETY   ? dicyclomine (BENTYL) 10 MG capsule Take 1 capsule (10 mg total) by mouth in the morning, at noon, in  the evening, and at bedtime.   ? escitalopram (LEXAPRO) 20 MG tablet Take 1 tablet (20 mg total) by mouth daily.   ? famotidine (PEPCID) 20 MG tablet TAKE ONE TABLET BY MOUTH EVERY MORNING and TAKE ONE TABLET BY MOUTH EVERYDAY AT BEDTIME   ? isosorbide mononitrate (IMDUR) 30 MG 24 hr tablet Take 1 tablet (30 mg total) by mouth daily.   ? levothyroxine (SYNTHROID) 25 MCG tablet TAKE ONE TABLET BY MOUTH EVERY MORNING   ? lisinopril (ZESTRIL) 2.5 MG tablet Take 1 tablet (2.5 mg total) by mouth daily.   ? meclizine (ANTIVERT) 25 MG tablet TAKE ONE TABLET BY MOUTH TWICE DAILY AS NEEDED FOR dizziness   ? metoprolol succinate (TOPROL-XL) 25 MG 24 hr tablet Take 0.5 tablets (12.5 mg total) by mouth daily.   ? nitroGLYCERIN (NITROSTAT) 0.4 MG SL tablet Place 1 tablet (0.4 mg total) under the tongue every 5 (five) minutes as needed for chest pain.   ? pantoprazole (PROTONIX) 40 MG tablet Take 1 tablet (40 mg total) by mouth 2 (two) times daily.   ? potassium chloride SA (KLOR-CON M) 20 MEQ tablet TAKE ONE TABLET BY MOUTH AT NOON   ? sucralfate (CARAFATE) 1 g tablet TAKE ONE TABLET BY MOUTH FOUR TIMES DAILY. MAY cut pill in half AND DISSOLVE in water prior TO drinking   ? aspirin 81 MG EC tablet Take 1 tablet (81 mg total) by mouth daily. Swallow whole. (Patient not taking: Reported on 06/22/2021) 05/07/2021: Patient has ran out but will purchase OTC  ?  ezetimibe (ZETIA) 10 MG tablet Take 1 tablet (10 mg total) by mouth daily.   ? nystatin (MYCOSTATIN/NYSTOP) powder APPLY POWDER TOPICALLY TO AFFECTED AREA 2 TIMES DAILY (Patient not taking: Reported on 06/04/2021)   ? ?No facility-administered encounter medications on file as of 06/22/2021.  ? ? ?Patient Active Problem List  ? Diagnosis Date Noted  ? Cough 04/21/2021  ? Left otitis media 02/16/2021  ? Back pain 12/03/2020  ? Acute cystitis without hematuria 12/03/2020  ? Grief reaction 12/03/2020  ? Fatty liver 12/01/2020  ? Intermittent left-sided chest pain 09/17/2020  ? Coronary  artery disease involving native coronary artery of native heart 09/17/2020  ? Metabolic syndrome 09/17/2020  ? Prediabetes 09/17/2020  ? Allergy 09/16/2020  ? Anxiety 09/16/2020  ? Hypertension 09/16/2020  ? Vertigo 09/16/2020  ? Dark stools 09/08/2020  ? Dysphagia   ? Esophageal stricture   ? Hiatal hernia   ? Epigastric pain   ? Hypothyroid 01/25/2018  ? Mild CAD 10/03/2017  ? Stress-induced cardiomyopathy 09/19/2017  ? COPD exacerbation (HCC) 09/14/2017  ? Hypokalemia 09/14/2017  ? Non-ST elevation (NSTEMI) myocardial infarction [facility](HCC) - due to Demand Infarction 09/14/2017  ? CKD (chronic kidney disease) 04/04/2017  ? HLD (hyperlipidemia)   ? Allergic rhinitis   ? Gout 02/28/2017  ? Depression with anxiety 02/28/2017  ? Epigastric abdominal pain 12/22/2016  ? COPD with chronic bronchitis (HCC) 12/22/2016  ? GERD (gastroesophageal reflux disease) 12/22/2016  ? DOE (dyspnea on exertion) 12/22/2016  ? Peptic ulcer 01/04/2003  ? ? ?Conditions to be addressed/monitored:HTN and COPD ? ?Care Plan : RN Care Manager Plan of Care  ?Updates made by Colletta MarylandWallace, Pranav Lince M, RN since 06/22/2021 12:00 AM  ?  ? ?Problem: Chronic disease Managment education and/or care coordination needs   ?Priority: High  ?  ? ?Long-Range Goal: Development of Plan of Care for Chronic Disease Managment and/or care coordination needs   ?Start Date: 05/04/2021  ?Expected End Date: 11/01/2021  ?Priority: High  ?Note:   ?Current Barriers: Veronica Wolf reports recent birthday. She states she and her daughter went out for her birthday. she continues to be busy. Her grandson's live in the home with her great-grandchildren. Veronica Wolf states she has not had to use her rescue inhaler in several months. She states she has her packaged medications, But needs to obtain baby aspirin and made a note to remember to get it. She denies any questions or concerns at this time.  ?Care Coordination needs related to Limited social support and Lacks knowledge of community  resource: grieving loss of her brother  ?Chronic Disease Management support and education needs related to HTN and COPD ?Medication management/adherence-clinical pharmacist involved ?Does not have blood pressure monitor-She states she knows where she can get a blood pressure monitor and has made a note to self to obtain. ? ?RNCM Clinical Goal(s):  ?Patient will verbalize understanding of plan for management of HTN and COPD as evidenced by self report and/or chart notatation ?demonstrate improved adherence to prescribed treatment plan for HTN and COPD as evidenced by self report and/or chart notation ?continue to work with RN Care Manager and/or Social Worker to address care management and care coordination needs related to HTN and COPD as evidenced by adherence to CM Team Scheduled appointments     ?work with community resource care guide to address needs related to Limited social support, Transportation, and Lacks knowledge of community resource: report knowledge of available community resource as evidenced by patient and/or community resource care  guide support    through collaboration with RN Care manager, provider, and care team.  ? ?Interventions: ?1:1 collaboration with primary care provider regarding development and update of comprehensive plan of care as evidenced by provider attestation and co-signature ?Inter-disciplinary care team collaboration (see longitudinal plan of care) ?Evaluation of current treatment plan related to  self management and patient's adherence to plan as established by provider ?LCSW referral regarding recent loss of her brother, loss of independence, decrease in social activities ? ?COPD Interventions:  (Status:  Goal on track:  Yes.) Long Term Goal ?Denies any concern: states has not had to use Nebulizer in a several months ?Reviewed medications and encouraged to continue to take medications as prescribed ?Encouraged to attend provider visits as scheduled ? ?Hypertension  Interventions:  (Status:  Goal on track:  Yes.) Long Term Goal ?Last practice recorded BP readings:  ?BP Readings from Last 3 Encounters:  ?04/21/21 (!) 108/59  ?02/16/21 (!) 102/56  ?01/09/21 (!) 143/93  ?Most recent

## 2021-06-22 NOTE — Patient Instructions (Signed)
Visit Information ? ?Thank you for taking time to visit with me today. Please don't hesitate to contact me if I can be of assistance to you before our next scheduled telephone appointment. ? ?Following are the goals we discussed today:  ?Patient Goals/Self-Care Activities: ?Take medications as prescribed   ?Attend all scheduled provider appointments ?Continue to keep track of how you are feeling and any symptoms. Call provider office for new Health concerns or questions ?Use a notepad near you to write down your reminders ?Reminder: Ask your daughter to contact your eye doctor and schedule an Eye Exam ?Call your nurse care manager for care management and/or care coordination needs as needed ? ?Our next appointment is by telephone on 07/21/21 at 10:45 ? ?Please call the care guide team at [phone] if you need to cancel or reschedule your appointment.  ? ?If you are experiencing a Mental Health or Behavioral Health Crisis or need someone to talk to, please call the Suicide and Crisis Lifeline: 988 ?call 1-800-273-TALK (toll free, 24 hour hotline)  ? ?Patient verbalizes understanding of instructions and care plan provided today and agrees to view in MyChart. Active MyChart status confirmed with patient.   ? ?Kathyrn Sheriff, RN, MSN, BSN, CCM ?Care Management Coordinator ?[facility] ?[phone]  ?

## 2021-06-23 ENCOUNTER — Telehealth: Payer: Medicare HMO | Admitting: *Deleted

## 2021-06-23 ENCOUNTER — Telehealth: Payer: Self-pay | Admitting: *Deleted

## 2021-06-23 NOTE — Telephone Encounter (Signed)
?  Care Management  ? ?Follow Up Note ? ? ?06/23/2021 ? ?Name: Veronica Wolf   MRN: [id number]       DOB: [birth year] ?  ?Referred By: [name], NP ?  ?Reason for Referral: Chronic Care Management Needs in Patient with Hypertension, Hyperlipidemia, Type II Diabetes Mellitus, Depression with Anxiety, Grief Reaction, and Chronic Obstructive Pulmonary Disease.   ?  ?An unsuccessful telephone outreach was attempted today. The patient was referred to the case management team for assistance with care management and care coordination.  HIPAA compliant messages were left on voicemail, providing contact information, encouraging patient to return LCSW's call at her earliest convenience.  LCSW will make a second follow-up telephone outreach call attempt within the next 7-10 business days, if a return call is not received from patient in the meantime. ?  ?Follow-Up Plan:  Request placed with Scheduling Care Guide to reschedule patient's follow-up telephone outreach call with LCSW. ?  ?Danford Bad LCSW ?Licensed Clinical Social Worker ?[facility] ?[phone]   ?

## 2021-06-29 ENCOUNTER — Telehealth: Payer: Self-pay

## 2021-06-29 NOTE — Telephone Encounter (Signed)
Nurse Assessment ?Nurse: Yetta Barre, RN, Rene Kocher Date/Time (Eastern Time): 06/28/2021 5:36:32 PM ?Confirm and document reason for call. If ?symptomatic, describe symptoms. ?---Caller states she would like to speak to a nurse. ?She needs someone to tell her how to use the at home ?Covid test. No symptoms and just wants to know how ?to test herself. ?Does the patient have any new or worsening ?symptoms? ---No ?Please document clinical information provided and ?list any resource used. ?---Advised caller that it isn't necessary to test unless ?she was exposed or having symptoms. She denies ?symptoms and just wanted to know why she got them ?in the mail and if she needed to test herself. ?Disp. Time (Eastern ?Time) Disposition Final User ?06/28/2021 5:46:44 PM Clinical Call Yes Yetta Barre, RN, Rene Kocher ?

## 2021-07-03 ENCOUNTER — Telehealth: Payer: Self-pay | Admitting: Family

## 2021-07-03 DIAGNOSIS — E039 Hypothyroidism, unspecified: Secondary | ICD-10-CM

## 2021-07-03 DIAGNOSIS — J449 Chronic obstructive pulmonary disease, unspecified: Secondary | ICD-10-CM

## 2021-07-03 DIAGNOSIS — F32A Depression, unspecified: Secondary | ICD-10-CM

## 2021-07-03 DIAGNOSIS — I10 Essential (primary) hypertension: Secondary | ICD-10-CM

## 2021-07-03 DIAGNOSIS — E782 Mixed hyperlipidemia: Secondary | ICD-10-CM | POA: Diagnosis not present

## 2021-07-03 NOTE — Telephone Encounter (Signed)
Requesting: diazepam 10mg   ?Contract: 03/14/20 ?UDS: 03/05/21  ?Last Visit: 04/21/21 ?Next Visit: None ?Last Refill: 06/08/21 #45 and 0RF  ? ?Please Advise ? ?

## 2021-07-03 NOTE — Telephone Encounter (Signed)
PDMP okay, Rx sent 

## 2021-07-06 ENCOUNTER — Telehealth: Payer: Self-pay | Admitting: Family

## 2021-07-06 NOTE — Telephone Encounter (Signed)
Patients PCP called the patient and has taken care of .  ?

## 2021-07-06 NOTE — Telephone Encounter (Signed)
The patient called in and has been having vomiting and diarrhea.since the weekend. ?No other symptoms ? ?  ? ?

## 2021-07-07 ENCOUNTER — Telehealth: Payer: Medicare HMO

## 2021-07-21 ENCOUNTER — Ambulatory Visit (INDEPENDENT_AMBULATORY_CARE_PROVIDER_SITE_OTHER): Payer: Medicare PPO

## 2021-07-21 DIAGNOSIS — I10 Essential (primary) hypertension: Secondary | ICD-10-CM

## 2021-07-21 DIAGNOSIS — J449 Chronic obstructive pulmonary disease, unspecified: Secondary | ICD-10-CM

## 2021-07-21 NOTE — Chronic Care Management (AMB) (Signed)
?Chronic Care Management  ? ?CCM RN Visit Note ? ?07/21/2021 ?Name: Veronica Wolf MRN: [id number] DOB: 1941/01/16 ? ?Subjective: ?Veronica Wolf is a 81 y.o. year old female who is a primary care patient of Debbrah Alar, NP. The care management team was consulted for assistance with disease management and care coordination needs.   ? ?Engaged with patient by telephone for follow up visit in response to provider referral for case management and/or care coordination services.  ? ?Consent to Services:  ?The patient was given information about Chronic Care Management services, agreed to services, and gave verbal consent prior to initiation of services.  Please see initial visit note for detailed documentation.  ? ?Patient agreed to services and verbal consent obtained.  ? ?Assessment: Review of patient past medical history, allergies, medications, health status, including review of consultants reports, laboratory and other test data, was performed as part of comprehensive evaluation and provision of chronic care management services.  ? ?SDOH (Social Determinants of Health) assessments and interventions performed:   ? ?CCM Care Plan ? ?Allergies  ?Allergen Reactions  ? Ibuprofen Other (See Comments)  ?  Pt has hx of peptic ulcer and diverticulitis.   ? ? ?Outpatient Encounter Medications as of 07/21/2021  ?Medication Sig Note  ? albuterol (PROVENTIL) (2.5 MG/3ML) 0.083% nebulizer solution USE 1 VIAL IN NEBULIZER EVERY 6 HOURS AS NEEDED FOR WHEEZING AND FOR SHORTNESS OF BREATH   ? allopurinol (ZYLOPRIM) 100 MG tablet Take 2 tablets (200 mg total) by mouth daily.   ? atorvastatin (LIPITOR) 80 MG tablet Take 1 tablet (80 mg total) by mouth daily.   ? colchicine 0.6 MG tablet Take 0.6 mg by mouth daily as needed (gout).   ? diazepam (VALIUM) 10 MG tablet TAKE ONE TABLET BY MOUTH EVERY TWELVE HOURS AS NEEDED FOR ANXIETY   ? dicyclomine (BENTYL) 10 MG capsule Take 1 capsule (10 mg total) by mouth in the morning, at noon, in  the evening, and at bedtime.   ? escitalopram (LEXAPRO) 20 MG tablet Take 1 tablet (20 mg total) by mouth daily.   ? famotidine (PEPCID) 20 MG tablet TAKE ONE TABLET BY MOUTH EVERY MORNING and TAKE ONE TABLET BY MOUTH EVERYDAY AT BEDTIME   ? isosorbide mononitrate (IMDUR) 30 MG 24 hr tablet Take 1 tablet (30 mg total) by mouth daily.   ? levothyroxine (SYNTHROID) 25 MCG tablet TAKE ONE TABLET BY MOUTH EVERY MORNING   ? lisinopril (ZESTRIL) 2.5 MG tablet Take 1 tablet (2.5 mg total) by mouth daily.   ? meclizine (ANTIVERT) 25 MG tablet TAKE ONE TABLET BY MOUTH TWICE DAILY AS NEEDED FOR dizziness   ? metoprolol succinate (TOPROL-XL) 25 MG 24 hr tablet Take 0.5 tablets (12.5 mg total) by mouth daily.   ? nitroGLYCERIN (NITROSTAT) 0.4 MG SL tablet Place 1 tablet (0.4 mg total) under the tongue every 5 (five) minutes as needed for chest pain.   ? nystatin (MYCOSTATIN/NYSTOP) powder APPLY POWDER TOPICALLY TO AFFECTED AREA 2 TIMES DAILY   ? pantoprazole (PROTONIX) 40 MG tablet Take 1 tablet (40 mg total) by mouth 2 (two) times daily.   ? potassium chloride SA (KLOR-CON M) 20 MEQ tablet TAKE ONE TABLET BY MOUTH AT NOON   ? sucralfate (CARAFATE) 1 g tablet TAKE ONE TABLET BY MOUTH FOUR TIMES DAILY. MAY cut pill in half AND DISSOLVE in water prior TO drinking   ? aspirin 81 MG EC tablet Take 1 tablet (81 mg total) by mouth daily. Swallow whole. (  Patient not taking: Reported on 06/22/2021) 05/07/2021: Patient has ran out but will purchase OTC  ? ezetimibe (ZETIA) 10 MG tablet Take 1 tablet (10 mg total) by mouth daily.   ? ?No facility-administered encounter medications on file as of 07/21/2021.  ? ? ?Patient Active Problem List  ? Diagnosis Date Noted  ? Cough 04/21/2021  ? Left otitis media 02/16/2021  ? Back pain 12/03/2020  ? Acute cystitis without hematuria 12/03/2020  ? Grief reaction 12/03/2020  ? Fatty liver 12/01/2020  ? Intermittent left-sided chest pain 09/17/2020  ? Coronary artery disease involving native coronary  artery of native heart 09/17/2020  ? Metabolic syndrome [id number]  ? Prediabetes 09/17/2020  ? Allergy 09/16/2020  ? Anxiety 09/16/2020  ? Hypertension 09/16/2020  ? Vertigo 09/16/2020  ? Dark stools 09/08/2020  ? Dysphagia   ? Esophageal stricture   ? Hiatal hernia   ? Epigastric pain   ? Hypothyroid 01/25/2018  ? Mild CAD 10/03/2017  ? Stress-induced cardiomyopathy 09/19/2017  ? COPD exacerbation ([place]) 09/14/2017  ? Hypokalemia 09/14/2017  ? Non-ST elevation (NSTEMI) myocardial infarction [facility]) - due to Demand Infarction 09/14/2017  ? CKD (chronic kidney disease) 04/04/2017  ? HLD (hyperlipidemia)   ? Allergic rhinitis   ? Gout 02/28/2017  ? Depression with anxiety 02/28/2017  ? Epigastric abdominal pain 12/22/2016  ? COPD with chronic bronchitis ([place]) 12/22/2016  ? GERD (gastroesophageal reflux disease) 12/22/2016  ? DOE (dyspnea on exertion) 12/22/2016  ? Peptic ulcer 01/04/2003  ? ? ?Conditions to be addressed/monitored:HTN and COPD ? ?Care Plan : RN Care Manager Plan of Care  ?Updates made by Luretha Rued, RN since 07/21/2021 12:00 AM  ?  ? ?Problem: Chronic disease Managment education and/or care coordination needs   ?Priority: High  ?  ? ?Long-Range Goal: Development of Plan of Care for Chronic Disease Managment and/or care coordination needs   ?Start Date: 05/04/2021  ?Expected End Date: 11/01/2021  ?Priority: High  ?Note:   ?Current Barriers:  ?Care Coordination needs related to Limited social support and Lacks knowledge of community resource: grieving loss of her brother  ?Chronic Disease Management support and education needs related to HTN and COPD ?Medication management/adherence-clinical pharmacist involved ?Does not have blood pressure monitor-She states she knows where she can get a blood pressure monitor and has made a note to self to obtain. ?06/22/21 Mrs. Burrough reports recent birthday. She states she and her daughter went out for her birthday. she continues to be busy. Her grandson's live in  the home with her great-grandchildren. Mrs. Doring states she has not had to use her rescue inhaler in several months. She states she has her packaged medications, But needs to obtain baby aspirin and made a note to remember to get it. She denies any questions or concerns at this time.  ?07/21/21 Mrs. Filippi states, "I am doing ok". She denies any signs/symptoms of COPD exacerbation. She states she is going to contact [facility] regarding her OTC benefit to obtain blood pressure monitor and states she is going to ask her daughter to assist her with it. Previously contact PCP 07/06/21 regarding nausea/diarrhea, which Mrs. Cornwell states is resolved. She expresses her main issue is that she knows that she needs to get out more and socialize. No other concerns voiced. Mrs. Jain has missed several care management telephone calls- reports she had been having trouble with her phone. ? ?RNCM Clinical Goal(s):  ?Patient will verbalize understanding of plan for management of HTN and COPD as evidenced  by self report and/or chart notatation ?demonstrate improved adherence to prescribed treatment plan for HTN and COPD as evidenced by self report and/or chart notation ?continue to work with RN Care Manager and/or Social Worker to address care management and care coordination needs related to HTN and COPD as evidenced by adherence to CM Team Scheduled appointments     ?work with community resource care guide to address needs related to Limited social support, Transport planner, and Lacks knowledge of community resource: report knowledge of available community resource as evidenced by patient and/or community resource care guide support    through collaboration with Consulting civil engineer, provider, and care team.  ? ?Interventions: ?1:1 collaboration with primary care provider regarding development and update of comprehensive plan of care as evidenced by provider attestation and co-signature ?Inter-disciplinary care team collaboration (see  longitudinal plan of care) ?Evaluation of current treatment plan related to  self management and patient's adherence to plan as established by provider ?LCSW referral regarding recent loss of her brother, loss

## 2021-07-21 NOTE — Patient Instructions (Signed)
Visit Information ? ?Thank you for taking time to visit with me today. Please don't hesitate to contact me if I can be of assistance to you before our next scheduled telephone appointment. ? ?Following are the goals we discussed today:  ?Patient Goals/Self-Care Activities: ?Take medications as prescribed   ?Attend all scheduled provider appointments ?Continue to keep track of how you are feeling and any symptoms. Call provider office for new Health concerns or questions ?Use a notepad near you to write down your reminders ?Contact: [facility]: [phone] and [facility] [phone] for Senior activities programs.  ?Ask your daughter to help you arrange provider appointments and contact senior resources  ?Call your nurse care manager for care management and/or care coordination needs as needed ? ?Our next appointment is by telephone on 08/04/21 at 10:45 am ? ?Please call the care guide team at [phone] if you need to cancel or reschedule your appointment.  ? ?If you are experiencing a Mental Health or Behavioral Health Crisis or need someone to talk to, please call the Suicide and Crisis Lifeline: 988 ?call 1-800-273-TALK (toll free, 24 hour hotline)  ? ?The patient verbalized understanding of instructions, educational materials, and care plan provided today and agreed to receive a mailed copy of patient instructions, educational materials, and care plan.  ? ?Kathyrn Sheriff, RN, MSN, BSN, CCM ?Care Management Coordinator ?[facility] ?[phone]  ?

## 2021-07-21 NOTE — Chronic Care Management (AMB) (Signed)
?  Care Management  ? ?Note ? ?07/21/2021 ?Name: Veronica Wolf MRN: [id number] DOB: May 09, 1940 ? ?Veronica Wolf is a 81 y.o. year old female who is a primary care patient of Debbrah Alar, NP and is actively engaged with the care management team. I reached out to [name] by phone today to assist with re-scheduling a follow up visit with the Licensed Clinical Social Worker ? ?Follow up plan: ?2nd Unsuccessful telephone outreach attempt made. A HIPAA compliant phone message was left for the patient providing contact information and requesting a return call.  ? ?Ed Rayson, CCMA ?Care Guide, Embedded Care Coordination ?[place]  Care Management  ?Direct Dial: [phone] ? ? ?

## 2021-07-22 ENCOUNTER — Telehealth: Payer: Self-pay | Admitting: *Deleted

## 2021-07-22 ENCOUNTER — Telehealth: Payer: Medicare PPO | Admitting: *Deleted

## 2021-07-22 NOTE — Telephone Encounter (Signed)
?  Care Management  ? ?Follow Up Note ? ? ?07/22/2021 ? ?Name: Veronica Wolf MRN: [id number] DOB: Jun 27, 1940 ? ?Referred By: [name], NP ? ?Reason for Referral: Chronic Care Management Needs in Patient with Hypertension, Hyperlipidemia, Type II Diabetes Mellitus, Depression with Anxiety, Grief Reaction, and Chronic Obstructive Pulmonary Disease.   ? ?An unsuccessful telephone outreach was attempted today. The patient was referred to the case management team for assistance with care management and care coordination. A HIPAA compliant message was left on voicemail, providing contact information, encouraging patient to return CSW's call at her earliest convenience.  ? ?Follow-Up Plan:  Request placed with Scheduling Care Guide to schedule a follow-up telephone outreach call for patient with LCSW. ? ?Danford Bad LCSW ?Licensed Clinical Social Worker ?[facility] ?[phone]  ?

## 2021-07-27 ENCOUNTER — Telehealth: Payer: Self-pay | Admitting: *Deleted

## 2021-07-27 NOTE — Telephone Encounter (Signed)
Triage nurse Anderson Malta called and stated that she has someone on the line that needs and appointment.  Advised to transfer.  Spoke with patient and she stated that she was having on and off chest pain.  She stated it is something that she has had before.  No other symptoms.  She only wants to schedule with Melissa and also declined appointment today with another provider.  Appointment made for tomorrow and advised patient that if pain worsens to go to ER or call 911. ?

## 2021-07-28 ENCOUNTER — Telehealth: Payer: Self-pay | Admitting: *Deleted

## 2021-07-28 ENCOUNTER — Ambulatory Visit: Payer: Medicare PPO | Admitting: Family

## 2021-07-28 NOTE — Telephone Encounter (Signed)
Patient stated that she was trying to find a right and I advised that we will hold the slot for her.  Advised also that if she does not get a ride to call us to reschedule or go to ER.   ?

## 2021-07-28 NOTE — Telephone Encounter (Signed)
Contact Type Call ?Who Is Calling Patient / Member / Family / Caregiver ?Caller Name Shaunna Rosetti ?Caller Phone Number [phone] ?Reason for Call Symptomatic / Request for Health Information ?Initial Comment Caller states wants to reschedule 10:20 appt to a different day; Caller declined triage. Adv to ?call after 8am to reschedule; ?Disp. Time Disposition Final User ?07/28/2021 7:39:00 AM General Information Provided Yes Albin Fischer ?

## 2021-07-28 NOTE — Progress Notes (Incomplete)
? ?Subjective:  ? ?By signing my name below, I, Carylon Perches, attest that this documentation has been prepared under the direction and in the presence of Debbrah Alar NP, 07/28/2021   ? ? Patient ID: Veronica Wolf, female    DOB: Jun 02, 1940, 81 y.o.   MRN: [id number] ? ?No chief complaint on file. ? ? ?HPI ?Patient is in today for an office visit ? ?Health Maintenance Due  ?Topic Date Due  ? Zoster Vaccines- Shingrix (1 of 2) Never done  ? COVID-19 Vaccine (3 - Pfizer risk series) 07/06/2019  ? ? ?Past Medical History:  ?Diagnosis Date  ? Allergic rhinitis   ? Allergy   ? seasonal allergies  ? Anxiety   ? CKD (chronic kidney disease) 04/04/2017  ? COPD (chronic obstructive pulmonary disease) ([place])   ? uses inhaler  ? Depression   ? Fatty liver   ? GERD (gastroesophageal reflux disease)   ? Gout   ? hx of  ? Hyperlipidemia   ? on meds  ? Hypertension   ? on meds  ? Myocardial infarction [facility])   ? Peptic ulcer 01/04/2003  ? Vertigo   ? ? ?Past Surgical History:  ?Procedure Laterality Date  ? ABDOMINAL HYSTERECTOMY    ? APPENDECTOMY    ? BIOPSY  08/13/2020  ? Procedure: BIOPSY;  Surgeon: Lavena Bullion, DO;  Location: [facility];  Service: Gastroenterology;;  ? ESOPHAGOGASTRODUODENOSCOPY (EGD) WITH PROPOFOL N/A 08/13/2020  ? Procedure: ESOPHAGOGASTRODUODENOSCOPY (EGD) WITH PROPOFOL;  Surgeon: Lavena Bullion, DO;  Location: [facility];  Service: Gastroenterology;  Laterality: N/A;  ? LEFT HEART CATH AND CORONARY ANGIOGRAPHY N/A 06/13/2017  ? Procedure: LEFT HEART CATH AND CORONARY ANGIOGRAPHY;  Surgeon: Leonie Man, MD;  Location: [facility];  Service: Cardiovascular;  Laterality: N/A;  ? RIGHT/LEFT HEART CATH AND CORONARY ANGIOGRAPHY N/A 09/16/2017  ? Procedure: RIGHT/LEFT HEART CATH AND CORONARY ANGIOGRAPHY;  Surgeon: Martinique, Peter M, MD;  Location: [facility];  Service: Cardiovascular;  Laterality: N/A;  ? SAVORY DILATION N/A 08/13/2020  ? Procedure: SAVORY DILATION;  Surgeon:  Lavena Bullion, DO;  Location: [facility];  Service: Gastroenterology;  Laterality: N/A;  ? ULTRASOUND GUIDANCE FOR VASCULAR ACCESS  09/16/2017  ? Procedure: Ultrasound Guidance For Vascular Access;  Surgeon: Martinique, Peter M, MD;  Location: [place] [facility];  Service: Cardiovascular;;  ? WISDOM TOOTH EXTRACTION    ? ? ?Family History  ?Problem Relation Age of Onset  ? Breast cancer Mother   ? Stroke Father   ? Stomach cancer Neg Hx   ? Colon cancer Neg Hx   ? Pancreatic cancer Neg Hx   ? Esophageal cancer Neg Hx   ? Colon polyps Neg Hx   ? Rectal cancer Neg Hx   ? ? ?Social History  ? ?Socioeconomic History  ? Marital status: Widowed  ?  Spouse name: Not on file  ? Number of children: 1  ? Years of education: 31  ? Highest education level: 12th grade  ?Occupational History  ? Occupation: Retired  ?Tobacco Use  ? Smoking status: Former  ?  Types: Cigarettes  ?  Quit date: 04/24/2000  ?  Years since quitting: 21.2  ?  Passive exposure: Past  ? Smokeless tobacco: Never  ?Vaping Use  ? Vaping Use: Never used  ?Substance and Sexual Activity  ? Alcohol use: Not Currently  ?  Comment: has previous hx of ETOH abuse, quit 2014  ? Drug use: No  ?  Sexual activity: Not Currently  ?Other Topics Concern  ? Not on file  ?Social History Narrative  ? 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 Grew up in [place]  ? Has daughter locally  ? ?Social Determinants of Health  ? ?Financial Resource Strain: Low Risk   ? Difficulty of Paying Living Expenses: Not very hard  ?Food Insecurity: No Food Insecurity  ? Worried About Charity fundraiser in the Last Year: Never true  ? Ran Out of Food in the Last Year: Never true  ?Transportation Needs: No Transportation Needs  ? Lack of Transportation (Medical): No  ? Lack of Transportation (Non-Medical): No  ?Physical Activity: Inactive  ? Days of Exercise per Week: 0 days  ? Minutes of Exercise per Session: 0 min  ?Stress: Stress Concern Present  ? Feeling of Stress : Rather much   ?Social Connections: Moderately Isolated  ? Frequency of Communication with Friends and Family: More than three times a week  ? Frequency of Social Gatherings with Friends and Family: More than three times a week  ? Attends Religious Services: 1 to 4 times per year  ? Active Member of Clubs or Organizations: No  ? Attends Archivist Meetings: Never  ? Marital Status: Widowed  ?Intimate Partner Violence: Not At Risk  ? Fear of Current or Ex-Partner: No  ? Emotionally Abused: No  ? Physically Abused: No  ? Sexually Abused: No  ? ? ?Outpatient Medications Prior to Visit  ?Medication Sig Dispense Refill  ? albuterol (PROVENTIL) (2.5 MG/3ML) 0.083% nebulizer solution USE 1 VIAL IN NEBULIZER EVERY 6 HOURS AS NEEDED FOR WHEEZING AND FOR SHORTNESS OF BREATH 150 mL 0  ? allopurinol (ZYLOPRIM) 100 MG tablet Take 2 tablets (200 mg total) by mouth daily. 180 tablet 1  ? aspirin 81 MG EC tablet Take 1 tablet (81 mg total) by mouth daily. Swallow whole. (Patient not taking: Reported on 06/22/2021) 30 tablet 12  ? atorvastatin (LIPITOR) 80 MG tablet Take 1 tablet (80 mg total) by mouth daily. 90 tablet 3  ? colchicine 0.6 MG tablet Take 0.6 mg by mouth daily as needed (gout).    ? diazepam (VALIUM) 10 MG tablet TAKE ONE TABLET BY MOUTH EVERY TWELVE HOURS AS NEEDED FOR ANXIETY 45 tablet 0  ? dicyclomine (BENTYL) 10 MG capsule Take 1 capsule (10 mg total) by mouth in the morning, at noon, in the evening, and at bedtime. 360 capsule 1  ? escitalopram (LEXAPRO) 20 MG tablet Take 1 tablet (20 mg total) by mouth daily. 90 tablet 1  ? ezetimibe (ZETIA) 10 MG tablet Take 1 tablet (10 mg total) by mouth daily. 90 tablet 3  ? famotidine (PEPCID) 20 MG tablet TAKE ONE TABLET BY MOUTH EVERY MORNING and TAKE ONE TABLET BY MOUTH EVERYDAY AT BEDTIME 60 tablet 2  ? isosorbide mononitrate (IMDUR) 30 MG 24 hr tablet Take 1 tablet (30 mg total) by mouth daily. 90 tablet 1  ? levothyroxine (SYNTHROID) 25 MCG tablet TAKE ONE TABLET BY MOUTH  EVERY MORNING 90 tablet 1  ? lisinopril (ZESTRIL) 2.5 MG tablet Take 1 tablet (2.5 mg total) by mouth daily. 90 tablet 3  ? meclizine (ANTIVERT) 25 MG tablet TAKE ONE TABLET BY MOUTH TWICE DAILY AS NEEDED FOR dizziness 30 tablet 1  ? metoprolol succinate (TOPROL-XL) 25 MG 24 hr tablet Take 0.5 tablets (12.5 mg total) by mouth daily. 45 tablet 3  ? nitroGLYCERIN (NITROSTAT) 0.4 MG SL tablet Place 1  tablet (0.4 mg total) under the tongue every 5 (five) minutes as needed for chest pain. 45 tablet 3  ? nystatin (MYCOSTATIN/NYSTOP) powder APPLY POWDER TOPICALLY TO AFFECTED AREA 2 TIMES DAILY 60 g 4  ? pantoprazole (PROTONIX) 40 MG tablet Take 1 tablet (40 mg total) by mouth 2 (two) times daily. 180 tablet 0  ? potassium chloride SA (KLOR-CON M) 20 MEQ tablet TAKE ONE TABLET BY MOUTH AT NOON 90 tablet 1  ? sucralfate (CARAFATE) 1 g tablet TAKE ONE TABLET BY MOUTH FOUR TIMES DAILY. MAY cut pill in half AND DISSOLVE in water prior TO drinking 120 tablet 0  ? ?No facility-administered medications prior to visit.  ? ? ?Allergies  ?Allergen Reactions  ? Ibuprofen Other (See Comments)  ?  Pt has hx of peptic ulcer and diverticulitis.   ? ? ?ROS ? ?   ?Objective:  ?  ?Physical Exam ?Constitutional:   ?   General: She is not in acute distress. ?   Appearance: Normal appearance. She is not ill-appearing.  ?HENT:  ?   Head: Normocephalic and atraumatic.  ?   Right Ear: External ear normal.  ?   Left Ear: External ear normal.  ?Eyes:  ?   Extraocular Movements: Extraocular movements intact.  ?   Pupils: Pupils are equal, round, and reactive to light.  ?Cardiovascular:  ?   Rate and Rhythm: Normal rate and regular rhythm.  ?   Heart sounds: No murmur heard. ?  No gallop.  ?Pulmonary:  ?   Effort: Pulmonary effort is normal. No respiratory distress.  ?   Breath sounds: Normal breath sounds. No wheezing or rales.  ?Skin: ?   General: Skin is warm and dry.  ?Neurological:  ?   Mental Status: She is alert and oriented to person, place,  and time.  ?Psychiatric:     ?   Mood and Affect: Mood normal.     ?   Behavior: Behavior normal.     ?   Judgment: Judgment normal.  ? ? ?There were no vitals taken for this visit. ?Wt Readings from Last 3 Encou

## 2021-07-29 NOTE — Chronic Care Management (AMB) (Signed)
?  Care Management  ? ?Note ? ?07/29/2021 ?Name: Veronica Wolf MRN: [id number] DOB: [birth year] ? ?Veronica Wolf is a 81 y.o. year old female who is a primary care patient of [name], NP and is actively engaged with the care management team. I reached out to [name] by phone today to assist with re-scheduling a follow up visit with the Licensed Clinical Social Worker ? ?Follow up plan: ?We have been unable to make contact with the patient for follow up. The care management team is available to follow up with the patient after provider conversation with the patient regarding recommendation for care management engagement and subsequent re-referral to the care management team.  ? ?Lewin Pellow, CCMA ?Care Guide, Embedded Care Coordination ?[place]  Care Management  ?Direct Dial: [phone] ? ? ?

## 2021-08-02 DIAGNOSIS — I10 Essential (primary) hypertension: Secondary | ICD-10-CM | POA: Diagnosis not present

## 2021-08-02 DIAGNOSIS — J449 Chronic obstructive pulmonary disease, unspecified: Secondary | ICD-10-CM

## 2021-08-03 ENCOUNTER — Other Ambulatory Visit: Payer: Self-pay | Admitting: Internal Medicine

## 2021-08-03 NOTE — Telephone Encounter (Signed)
Requesting: diazepam 10mg   ?Contract: 03/14/20 ?UDS: 03/05/21 ?Last Visit: 04/21/21 ?Next Visit: None ?Last Refill: 07/03/21 #45 and 0RF ? ?Please Advise ? ?

## 2021-08-04 ENCOUNTER — Telehealth: Payer: Medicare PPO

## 2021-08-04 ENCOUNTER — Telehealth: Payer: Self-pay

## 2021-08-04 NOTE — Telephone Encounter (Signed)
?  Care Management  ? ?Follow Up Note ? ? ?08/04/2021 ?Name: Veronica Wolf MRN: [id number] DOB: 28-May-1940 ? ? ?Referred by: [name], NP ?Reason for referral : No chief complaint on file. ? ? ?An unsuccessful telephone outreach was attempted today. The patient was referred to the case management team for assistance with care management and care coordination.  A HIPPA compliant phone message was left for the patient providing contact information and requesting a return call.  ? ?Follow Up Plan: The Care Management Team will reach out to patient again over the next 30 days. ? ?Kathyrn Sheriff, RN, MSN, BSN, CCM ?Care Management Coordinator ?[facility] ?[phone]  ?

## 2021-08-05 ENCOUNTER — Other Ambulatory Visit: Payer: Self-pay | Admitting: Family

## 2021-08-05 ENCOUNTER — Telehealth: Payer: Self-pay | Admitting: *Deleted

## 2021-08-05 NOTE — Chronic Care Management (AMB) (Signed)
  Chronic Care Management Note  08/05/2021 Name: [name] MRN: [id number] DOB: [birth year]  [name] is a 81 y.o. year old female who is a primary care patient of [name], NP and is actively engaged with the care management team. I reached out to [name] by phone today to assist with re-scheduling a follow up visit with the RN Case Manager  Follow up plan: Unsuccessful telephone outreach attempt made. A HIPAA compliant phone message was left for the patient providing contact information and requesting a return call.   Burman Nieves, CCMA Care Guide, Embedded Care Coordination [facility]  Care Management  Direct Dial: [phone]

## 2021-08-10 ENCOUNTER — Other Ambulatory Visit: Payer: Self-pay | Admitting: Family

## 2021-08-10 DIAGNOSIS — R42 Dizziness and giddiness: Secondary | ICD-10-CM

## 2021-08-12 ENCOUNTER — Telehealth: Payer: Self-pay | Admitting: Family

## 2021-08-12 NOTE — Telephone Encounter (Signed)
I see that requested a continuous glucose monitor from her insurance. Since she is not currently taking diabetes pills or insulin, it looks like it won't be covered.  Please let her know.  ?

## 2021-08-12 NOTE — Telephone Encounter (Signed)
Last in person ov note from 02-16-2021 faxed ?

## 2021-08-12 NOTE — Telephone Encounter (Signed)
Called but no answer and no voice mail 

## 2021-08-12 NOTE — Telephone Encounter (Signed)
Caller: Lynann Bologna  ?[employer] ZA:[phone] ? ?Most recent office notes for pt fax [phone] attention Lynann Bologna  ?

## 2021-08-16 ENCOUNTER — Other Ambulatory Visit: Payer: Self-pay | Admitting: Family

## 2021-08-24 ENCOUNTER — Telehealth: Payer: Self-pay | Admitting: Family

## 2021-08-24 NOTE — Telephone Encounter (Signed)
Patient triaged for chest pain. Per triage nurse, patient refused ER/EMS. Patient hung up while traige nurse speaking to clinic.  Attempted to contact patient, no answer on home number. Left message on cell number requesting call back.  Attempted to contact patients daughter, Noreene Larsson, okay per DPR. No answer with [phone]. Other number [phone]) out of service.   Per PCP, requested welfare check by [employer] (spoke with Aundra Millet).   Officer to call clinic with update.

## 2021-08-24 NOTE — Telephone Encounter (Signed)
Pt states she has chest pain that come and goes with no other sxs. Scheduled her first available with pcp and transferred to triage.

## 2021-08-24 NOTE — Telephone Encounter (Signed)
Nurse Assessment Nurse: Charna Elizabeth, RN, Cathy Date/Time (Eastern Time): 08/24/2021 1:32:51 PM Confirm and document reason for call. If symptomatic, describe symptoms. ---[employer] states she developed pain on the right side of her chest again today (current pain rated as a 7 on the 1 to 10 scale). No severe breathing difficulty or blueness around her lips. No injury in the past 3 days. No fever. Alert and responsive. Does the patient have any new or worsening symptoms? ---Yes Will a triage be completed? ---Yes Related visit to physician within the last 2 weeks? ---No Does the PT have any chronic conditions? (i.e. diabetes, asthma, this includes High risk factors for pregnancy, etc.) ---Yes List chronic conditions. ---Unknown Is this a behavioral health or substance abuse call? ---No Guidelines Guideline Title Affirmed Question Affirmed Notes Nurse Date/Time (Eastern Time) Chest Pain Difficult to awaken or acting confused (e.g., disoriented, slurred speech) Charna Elizabeth, RN, Lynden Ang 08/24/2021 1:37:49 PM PLEASE NOTE: All timestamps contained within this report are represented as [place] Standard Time. CONFIDENTIALTY NOTICE: This fax transmission is intended only for the addressee. It contains information that is legally privileged, confidential or otherwise protected from use or disclosure. If you are not the intended recipient, you are strictly prohibited from reviewing, disclosing, copying using or disseminating any of this information or taking any action in reliance on or regarding this information. If you have received this fax in error, please notify us immediately by telephone so that we can arrange for its return to [place]. Phone: [phone], Toll-Free: [phone], Fax: [phone] Page: 2 of 2 Call Id: [id number] Disp. Time Lamount Cohen Time) Disposition Final User 08/24/2021 1:31:02 PM Send to Urgent Hansel Starling 08/24/2021 1:49:16 PM 911 Outcome Documentation Charna Elizabeth RNLynden Ang Reason: 911 Declined 08/24/2021 1:49:48 PM Call Completed Charna Elizabeth, RN, Lynden Ang 08/24/2021 1:39:07 PM Call EMS 911 Now Yes [place], RN, Frann Rider Disagree/Comply Disagree Caller Understands Yes PreDisposition Call Doctor Care Advice Given Per Guideline CALL EMS 911 NOW: * Immediate medical attention is needed. You need to hang up and call 911 (or an ambulance). FIRST AID - LIE DOWN FOR SHOCK: * Lie down with the feet elevated. * Reason: Treatment for shock. CARE ADVICE given per Chest Pain (Adult) guideline. Comments User: Colonel Bald, RN Date/Time Lamount Cohen Time): 08/24/2021 1:48:40 PM [place] [facility] having difficulty sharing her health history and address. She sternly declined the Call 911 disposition and requests to get direction from her health care providers as she has called 911/Gone to the ER in the past. Called the office backline and Selena Batten states she will call [place] [facility]

## 2021-08-24 NOTE — Telephone Encounter (Signed)
Spoke to [employer]. Officer states patient is awake, alert and without complaint. He reports she was up walking around house and talking without difficulty. No distress noted by officer.  Verified contact information with officer.

## 2021-08-25 ENCOUNTER — Other Ambulatory Visit: Payer: Self-pay | Admitting: Family

## 2021-08-26 ENCOUNTER — Ambulatory Visit (INDEPENDENT_AMBULATORY_CARE_PROVIDER_SITE_OTHER): Payer: Medicare PPO | Admitting: Family

## 2021-08-26 VITALS — BP 125/75 | HR 86 | Temp 98.0°F | Resp 16 | Wt 188.0 lb

## 2021-08-26 DIAGNOSIS — J449 Chronic obstructive pulmonary disease, unspecified: Secondary | ICD-10-CM | POA: Diagnosis not present

## 2021-08-26 DIAGNOSIS — R413 Other amnesia: Secondary | ICD-10-CM

## 2021-08-26 DIAGNOSIS — I10 Essential (primary) hypertension: Secondary | ICD-10-CM | POA: Diagnosis not present

## 2021-08-26 DIAGNOSIS — F418 Other specified anxiety disorders: Secondary | ICD-10-CM | POA: Diagnosis not present

## 2021-08-26 DIAGNOSIS — I2510 Atherosclerotic heart disease of native coronary artery without angina pectoris: Secondary | ICD-10-CM

## 2021-08-26 DIAGNOSIS — F4321 Adjustment disorder with depressed mood: Secondary | ICD-10-CM | POA: Diagnosis not present

## 2021-08-26 DIAGNOSIS — E782 Mixed hyperlipidemia: Secondary | ICD-10-CM | POA: Diagnosis not present

## 2021-08-26 DIAGNOSIS — F4320 Adjustment disorder, unspecified: Secondary | ICD-10-CM

## 2021-08-26 DIAGNOSIS — R0789 Other chest pain: Secondary | ICD-10-CM | POA: Diagnosis not present

## 2021-08-26 DIAGNOSIS — E039 Hypothyroidism, unspecified: Secondary | ICD-10-CM | POA: Diagnosis not present

## 2021-08-26 LAB — COMPREHENSIVE METABOLIC PANEL
ALT: 7 U/L (ref 0–35)
AST: 13 U/L (ref 0–37)
Albumin: 3.9 g/dL (ref 3.5–5.2)
Alkaline Phosphatase: 76 U/L (ref 39–117)
BUN: 10 mg/dL (ref 6–23)
CO2: 29 mEq/L (ref 19–32)
Calcium: 9.2 mg/dL (ref 8.4–10.5)
Chloride: 104 mEq/L (ref 96–112)
Creatinine, Ser: 1.52 mg/dL — ABNORMAL HIGH (ref 0.40–1.20)
GFR: 31.82 mL/min — ABNORMAL LOW (ref >60.00)
Glucose, Bld: 92 mg/dL (ref 70–99)
Potassium: 4.5 mEq/L (ref 3.5–5.1)
Sodium: 141 mEq/L (ref 135–145)
Total Bilirubin: 0.4 mg/dL (ref 0.2–1.2)
Total Protein: 6.7 g/dL (ref 6.0–8.3)

## 2021-08-26 LAB — LIPID PANEL
Cholesterol: 127 mg/dL (ref 0–200)
HDL: 43.1 mg/dL (ref >39.00)
LDL Cholesterol: 57 mg/dL (ref 0–99)
NonHDL: 83.98
Total CHOL/HDL Ratio: 3
Triglycerides: 135 mg/dL (ref 0.0–149.0)
VLDL: 27 mg/dL (ref 0.0–40.0)

## 2021-08-26 LAB — TSH: TSH: 4.8 u[IU]/mL (ref 0.35–5.50)

## 2021-08-26 MED ORDER — ALBUTEROL SULFATE HFA 108 (90 BASE) MCG/ACT IN AERS: 2.0000 | INHALATION_SPRAY | Freq: Four times a day (QID) | RESPIRATORY_TRACT | 2 refills | Status: DC | PRN

## 2021-08-26 MED ORDER — NEBULIZER/TUBING/MOUTHPIECE KIT: PACK | 0 refills | Status: AC

## 2021-08-26 NOTE — Progress Notes (Signed)
Subjective:     Patient ID: [name], female    DOB: 1940/10/03, 81 y.o.   MRN: [id number]  Chief Complaint  Patient presents with   Back Pain    Complains of mid back pain on and off   Pain    "Pain on left side of the body"   Chest Pain    Complains of chest pain, "feels like GERD"    HPI Patient is in today with multiple complaints.   She reports pain across her mid back which "comes and goes." She reports that this is new for her and does not seem to be worsened by movement.   She reports chest pain is epigastric. Similar to previous episodes. Reports that pain is intermittent and not worsened by exertion.    Of note, she saw Dr. Harriet Masson (cardiology) due to CP on exertion on 09/17/20.  At that time she was started on low dose imdur and referred for a nuclear stress test.  She did not complete the stress test and declined to reschedule when she was contacted by the RN.   States that she had some pain radiating down her right leg for 2 days a month or so ago which has resolved.   She states that she is continuing the aspirin.  She has been taking four tablets of aspirin.   Hyperlipidemia-  Lab Results  Component Value Date   CHOL 204 (H) 08/18/2020   HDL 42.30 08/18/2020   LDLCALC 125 (H) 08/18/2020   TRIG 182.0 (H) 08/18/2020   CHOLHDL 5 08/18/2020   Brother who lived in [place] one month ago and this loss has been very difficult for her.  Reports increased depression symptoms. Could sleep all day "if someone would let me." Doesn't have any motivation.   Lab Results  Component Value Date   HGBA1C 6.5 01/09/2021   HGBA1C 6.2 (H) 09/17/2020   HGBA1C 6.1 06/12/2018   Lab Results  Component Value Date   MICROALBUR <0.7 03/05/2021   [place] 125 (H) 08/18/2020   CREATININE 1.63 (H) 04/21/2021     Health Maintenance Due  Topic Date Due   Zoster Vaccines- Shingrix (1 of 2) Never done   COVID-19 Vaccine (3 - Pfizer risk series) 07/06/2019    Past Medical  History:  Diagnosis Date   Allergic rhinitis    Allergy    seasonal allergies   Anxiety    CKD (chronic kidney disease) 04/04/2017   COPD (chronic obstructive pulmonary disease) (HCC)    uses inhaler   Depression    Fatty liver    GERD (gastroesophageal reflux disease)    Gout    hx of   Hyperlipidemia    on meds   Hypertension    on meds   Myocardial infarction [facility])    Peptic ulcer 01/04/2003   Vertigo     Past Surgical History:  Procedure Laterality Date   ABDOMINAL HYSTERECTOMY     APPENDECTOMY     BIOPSY  08/13/2020   Procedure: BIOPSY;  Surgeon: Lavena Bullion, DO;  Location: [facility];  Service: Gastroenterology;;   ESOPHAGOGASTRODUODENOSCOPY (EGD) WITH PROPOFOL N/A 08/13/2020   Procedure: ESOPHAGOGASTRODUODENOSCOPY (EGD) WITH PROPOFOL;  Surgeon: Lavena Bullion, DO;  Location: [facility];  Service: Gastroenterology;  Laterality: N/A;   LEFT HEART CATH AND CORONARY ANGIOGRAPHY N/A 06/13/2017   Procedure: LEFT HEART CATH AND CORONARY ANGIOGRAPHY;  Surgeon: Leonie Man, MD;  Location: [place] [facility];  Service: Cardiovascular;  Laterality: N/A;  RIGHT/LEFT HEART CATH AND CORONARY ANGIOGRAPHY N/A 09/16/2017   Procedure: RIGHT/LEFT HEART CATH AND CORONARY ANGIOGRAPHY;  Surgeon: Martinique, Peter M, MD;  Location: [place] [facility];  Service: Cardiovascular;  Laterality: N/A;   SAVORY DILATION N/A 08/13/2020   Procedure: SAVORY DILATION;  Surgeon: Lavena Bullion, DO;  Location: [facility];  Service: Gastroenterology;  Laterality: N/A;   ULTRASOUND GUIDANCE FOR VASCULAR ACCESS  09/16/2017   Procedure: Ultrasound Guidance For Vascular Access;  Surgeon: Martinique, Peter M, MD;  Location: [place] [facility];  Service: Cardiovascular;;   WISDOM TOOTH EXTRACTION      Family History  Problem Relation Age of Onset   Breast cancer Mother    Stroke Father    Stomach cancer Neg Hx    Colon cancer Neg Hx    Pancreatic cancer Neg Hx    Esophageal cancer Neg Hx     Colon polyps Neg Hx    Rectal cancer Neg Hx     Social History   Socioeconomic History   Marital status: Widowed    Spouse name: Not on file   Number of children: 1   Years of education: 80   Highest education level: 12th grade  Occupational History   Occupation: Retired  Tobacco Use   Smoking status: Former    Types: Cigarettes    Quit date: 04/24/2000    Years since quitting: 21.3    Passive exposure: Past   Smokeless tobacco: Never  Vaping Use   Vaping Use: Never used  Substance and Sexual Activity   Alcohol use: Not Currently    Comment: has previous hx of ETOH abuse, quit 2014   Drug use: No   Sexual activity: Not Currently  Other Topics Concern   Not on file  Social History Narrative   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[place]   Grew up in [place]   Has daughter locally   Social Determinants of Radio broadcast assistant Strain: Low Risk    Difficulty of Paying Living Expenses: Not very hard  Food Insecurity: No Food Insecurity   Worried About Charity fundraiser in the Last Year: Never true   Arboriculturist in the Last Year: Never true  Transportation Needs: No Transportation Needs   Lack of Transportation (Medical): No   Lack of Transportation (Non-Medical): No  Physical Activity: Inactive   Days of Exercise per Week: 0 days   Minutes of Exercise per Session: 0 min  Stress: Stress Concern Present   Feeling of Stress : Rather much  Social Connections: Moderately Isolated   Frequency of Communication with Friends and Family: More than three times a week   Frequency of Social Gatherings with Friends and Family: More than three times a week   Attends Religious Services: 1 to 4 times per year   Active Member of [employer] or Organizations: No   Attends Archivist Meetings: Never   Marital Status: Widowed  Human resources officer Violence: Not At Risk   Fear of Current or Ex-Partner: No   Emotionally Abused: No   Physically Abused: No   Sexually  Abused: No    Outpatient Medications Prior to Visit  Medication Sig Dispense Refill   albuterol (PROVENTIL) (2.5 MG/3ML) 0.083% nebulizer solution USE 1 VIAL IN NEBULIZER EVERY 6 HOURS AS NEEDED FOR WHEEZING AND FOR SHORTNESS OF BREATH 150 mL 0   allopurinol (ZYLOPRIM) 100 MG tablet TAKE TWO TABLETS BY MOUTH ONCE DAILY 180 tablet 1  aspirin 81 MG EC tablet Take 1 tablet (81 mg total) by mouth daily. Swallow whole. (Patient not taking: Reported on 06/22/2021) 30 tablet 12   atorvastatin (LIPITOR) 80 MG tablet Take 1 tablet (80 mg total) by mouth daily. 90 tablet 3   colchicine 0.6 MG tablet Take 0.6 mg by mouth daily as needed (gout).     diazepam (VALIUM) 10 MG tablet TAKE ONE TABLET BY MOUTH EVERY TWELVE HOURS AS NEEDED FOR ANXIETY 45 tablet 0   dicyclomine (BENTYL) 10 MG capsule Take 1 capsule (10 mg total) by mouth in the morning, at noon, in the evening, and at bedtime. 360 capsule 1   escitalopram (LEXAPRO) 20 MG tablet Take 1 tablet (20 mg total) by mouth daily. 90 tablet 1   ezetimibe (ZETIA) 10 MG tablet Take 1 tablet (10 mg total) by mouth daily. 90 tablet 3   famotidine (PEPCID) 20 MG tablet TAKE ONE TABLET BY MOUTH EVERY MORNING and TAKE ONE TABLET BY MOUTH EVERYDAY AT BEDTIME 60 tablet 2   isosorbide mononitrate (IMDUR) 30 MG 24 hr tablet Take 1 tablet (30 mg total) by mouth daily. 90 tablet 1   levothyroxine (SYNTHROID) 25 MCG tablet TAKE ONE TABLET BY MOUTH EVERY MORNING 90 tablet 1   lisinopril (ZESTRIL) 2.5 MG tablet Take 1 tablet (2.5 mg total) by mouth daily. 90 tablet 3   meclizine (ANTIVERT) 25 MG tablet TAKE ONE TABLET BY MOUTH twice daily AS NEEDED FOR dizziness 30 tablet 1   metoprolol succinate (TOPROL-XL) 25 MG 24 hr tablet Take 0.5 tablets (12.5 mg total) by mouth daily. 45 tablet 3   nitroGLYCERIN (NITROSTAT) 0.4 MG SL tablet Place 1 tablet (0.4 mg total) under the tongue every 5 (five) minutes as needed for chest pain. 45 tablet 3   nystatin (MYCOSTATIN/NYSTOP) powder  APPLY POWDER TOPICALLY TO AFFECTED AREA 2 TIMES DAILY 60 g 4   pantoprazole (PROTONIX) 40 MG tablet TAKE ONE TABLET BY MOUTH TWICE DAILY 180 tablet 0   potassium chloride SA (KLOR-CON M) 20 MEQ tablet TAKE ONE TABLET BY MOUTH AT NOON 90 tablet 1   sucralfate (CARAFATE) 1 g tablet TAKE ONE TABLET BY MOUTH FOUR TIMES DAILY. MAY cut pill in half AND DISSOLVE in water prior TO drinking 120 tablet 0   No facility-administered medications prior to visit.    Allergies  Allergen Reactions   Ibuprofen Other (See Comments)    Pt has hx of peptic ulcer and diverticulitis.     ROS See HPI    Objective:    Physical Exam Constitutional:      General: She is not in acute distress.    Appearance: Normal appearance. She is well-developed.  HENT:     Head: Normocephalic and atraumatic.     Right Ear: External ear normal.     Left Ear: External ear normal.  Eyes:     General: No scleral icterus. Neck:     Thyroid: No thyromegaly.  Cardiovascular:     Rate and Rhythm: Normal rate and regular rhythm.     Heart sounds: Normal heart sounds. No murmur heard. Pulmonary:     Effort: Pulmonary effort is normal. No respiratory distress.     Breath sounds: Normal breath sounds. No wheezing.  Chest:     Chest wall: No tenderness.  Abdominal:     Palpations: Abdomen is soft.     Tenderness: There is no abdominal tenderness.  Musculoskeletal:     Cervical back: Neck supple.  Skin:  General: Skin is warm and dry.  Neurological:     Mental Status: She is alert.  Psychiatric:        Mood and Affect: Mood normal.        Behavior: Behavior normal.        Thought Content: Thought content normal.        Judgment: Judgment normal.    BP 125/75 (BP Location: Right Arm, Patient Position: Sitting, Cuff Size: Small)   Pulse 86   Temp 98 F (36.7 C) (Oral)   Resp 16   Wt 188 lb (85.3 kg)   SpO2 98%   BMI 32.27 kg/m  Wt Readings from Last 3 Encounters:  08/26/21 188 lb (85.3 kg)  04/13/21 182  lb (82.6 kg)  02/16/21 182 lb (82.6 kg)       Assessment & Plan:   Problem List Items Addressed This Visit       Unprioritized   Memory loss    This is a seemingly progressive issue. I think she is struggling to remember her medication and keep everything straight. She is agreeable to a referral to neurology for further evaluation of her memory. She did give me permission to reach out to her daughter. I reached out to her daughter and advised her of my concerns re: her med compliance, transportation issues and need to see cardiology/neurology. She will try to help her mom out and oversee these things the best that she can.        Relevant Orders   Ambulatory referral to Neurology   Hypothyroid    Clinically stable on synthroid. Obtain follow up TSH.        Relevant Orders   TSH   Hypertension    BP Readings from Last 3 Encounters:  08/26/21 125/75  04/21/21 (!) 108/59  02/16/21 (!) 102/56  BP stable. Monitor on lisinopril, imdur.       Relevant Orders   Comp Met (CMET)   HLD (hyperlipidemia)    Last lipid panel was elevated. Cardiology added zetia. Will obtain follow up lipid panel.       Relevant Orders   Lipid panel   Grief reaction    She continues to grieve the loss of her brother.  He passed 1 month ago. Support provided.        Depression with anxiety    Worsened some recently due to her brother's death. Continue lexapro 8m.        Coronary artery disease involving native coronary artery of native heart    EKG tracing is personally reviewed.  EKG notes NSR.  No acute changes.   Needs cardiology follow up and reschedule of her stress test.        Relevant Orders   Ambulatory referral to Cardiology   COPD (chronic obstructive pulmonary disease) ([place]    Daughter notes recent increased wheezing. States pt lost the mouthpiece for nebulizer. Will re-order along with an albuterol MDI to have on hand. No wheezing noted today.        Relevant  Medications   albuterol (VENTOLIN HFA) 108 (90 Base) MCG/ACT inhaler   Atypical chest pain - Primary    EKG tracing is personally reviewed.  EKG notes NSR.  No acute changes.   Suspect GI etiology- it is not clear that she is taking the pepcid/protonix. I have asked her to check when she gets home and start if she is not taking. Will also initiate follow up with her cardiologist so that she  can complete her cardiology evaluation.        Relevant Orders   EKG 12-Lead (Completed)   Ambulatory referral to Cardiology   51 minutes spent on today's visit. Time was spent counseling pt on med compliance, referrals, need for follow up and reviewing with her daughter.   I am having Shaylynn P. Julian "Mardene Celeste" start on albuterol and Nebulizer/Tubing/Mouthpiece. I am also having her maintain her colchicine, aspirin EC, nitroGLYCERIN, albuterol, atorvastatin, dicyclomine, escitalopram, ezetimibe, lisinopril, metoprolol succinate, sucralfate, potassium chloride SA, nystatin, isosorbide mononitrate, diazepam, pantoprazole, meclizine, levothyroxine, allopurinol, and famotidine.  Meds ordered this encounter  Medications   albuterol (VENTOLIN HFA) 108 (90 Base) MCG/ACT inhaler    Sig: Inhale 2 puffs into the lungs every 6 (six) hours as needed for wheezing or shortness of breath.    Dispense:  8 g    Refill:  2    Order Specific Question:   Supervising Provider    Answer:   Penni Homans A [[id number]]   Respiratory Therapy Supplies (NEBULIZER/TUBING/MOUTHPIECE) KIT    Sig: Use as directed    Dispense:  1 kit    Refill:  0    Order Specific Question:   Supervising Provider    Answer:   Penni Homans A [[id number]]

## 2021-08-26 NOTE — Assessment & Plan Note (Signed)
Daughter notes recent increased wheezing. States pt lost the mouthpiece for nebulizer. Will re-order along with an albuterol MDI to have on hand. No wheezing noted today.

## 2021-08-26 NOTE — Assessment & Plan Note (Signed)
BP Readings from Last 3 Encounters:  08/26/21 125/75  04/21/21 (!) 108/59  02/16/21 (!) 102/56   BP stable. Monitor on lisinopril, imdur.

## 2021-08-26 NOTE — Assessment & Plan Note (Signed)
She continues to grieve the loss of her brother.  He passed 1 month ago. Support provided.

## 2021-08-26 NOTE — Assessment & Plan Note (Deleted)
Lab Results  Component Value Date   HGBA1C 6.5 01/09/2021

## 2021-08-26 NOTE — Assessment & Plan Note (Signed)
This is a seemingly progressive issue. I think she is struggling to remember her medication and keep everything straight. She is agreeable to a referral to neurology for further evaluation of her memory. She did give me permission to reach out to her daughter. I reached out to her daughter and advised her of my concerns re: her med compliance, transportation issues and need to see cardiology/neurology. She will try to help her mom out and oversee these things the best that she can.

## 2021-08-26 NOTE — Patient Instructions (Addendum)
You should be contacted about scheduling your appointment with cardiology and neurology (for memory).  Continue tylenol as needed for back pain.

## 2021-08-26 NOTE — Assessment & Plan Note (Signed)
EKG tracing is personally reviewed.  EKG notes NSR.  No acute changes.   Suspect GI etiology- it is not clear that she is taking the pepcid/protonix. I have asked her to check when she gets home and start if she is not taking. Will also initiate follow up with her cardiologist so that she can complete her cardiology evaluation.

## 2021-08-26 NOTE — Assessment & Plan Note (Signed)
EKG tracing is personally reviewed.  EKG notes NSR.  No acute changes.   Needs cardiology follow up and reschedule of her stress test.

## 2021-08-26 NOTE — Assessment & Plan Note (Signed)
Worsened some recently due to her brother's death. Continue lexapro 20mg .

## 2021-08-26 NOTE — Assessment & Plan Note (Signed)
Last lipid panel was elevated. Cardiology added zetia. Will obtain follow up lipid panel.

## 2021-08-26 NOTE — Assessment & Plan Note (Signed)
Clinically stable on synthroid. Obtain follow up TSH.  

## 2021-08-27 ENCOUNTER — Encounter: Payer: Self-pay | Admitting: Family

## 2021-08-28 NOTE — Progress Notes (Signed)
Mailed out to patient 

## 2021-09-02 ENCOUNTER — Other Ambulatory Visit: Payer: Self-pay | Admitting: Family

## 2021-09-02 ENCOUNTER — Telehealth: Payer: Self-pay | Admitting: Family

## 2021-09-02 DIAGNOSIS — R197 Diarrhea, unspecified: Secondary | ICD-10-CM

## 2021-09-02 NOTE — Telephone Encounter (Signed)
Called patient and advised provider will like for her to be evaluated for her irritation. She reports "she is going to use some vaseline and try to go to urgent care tonight"

## 2021-09-02 NOTE — Telephone Encounter (Signed)
Requesting:valium 10 mg Contract:unknown UDS:03/05/21 Last Visit:08/26/21 Next Visit:unknown Last Refill:08/03/21  Please Advise

## 2021-09-02 NOTE — Telephone Encounter (Signed)
Pt states she cannot keep taking medications as they are giving her diarrhea. She does not know which medications are making her sick and would like to talk about getting this changed. She declined making an appointment as she was in here last week and forgot to mention it. She states her bottom is very sore and she cannot take it anymore. Please advise.

## 2021-09-02 NOTE — Telephone Encounter (Signed)
Per patient "she does not have diarrhea anymore, her concern is that she is very sore on her vaginal and anal area". She will like something for the soreness.

## 2021-09-02 NOTE — Telephone Encounter (Signed)
I don't see any obvious medications that would be contributing on her list. I would recommend that she use imodium as needed for diarrhea, come pick up a stool kit to return to make sure she doesn't have an infection in her bowels.  Go to ER if severe diarrhea/weakness occurs.

## 2021-09-03 NOTE — Telephone Encounter (Signed)
Pt called asking about managing medication for the issues she's been having. Pt would like a call to go over this with someone, if possible.

## 2021-09-03 NOTE — Telephone Encounter (Signed)
Patient reports she is "feeling much better, diarrhea has stopped and the vaginal-anal irritation is much better"  She did not go to urgent care yesterday.

## 2021-09-04 NOTE — Chronic Care Management (AMB) (Signed)
  Chronic Care Management Note  09/04/2021 Name: ANDILYNN DELAVEGA MRN: [id number] DOB: [birth year]  KSENIA KUNZ is a 81 y.o. year old female who is a primary care patient of [name], NP and is actively engaged with the care management team. I reached out to [name] by phone today to assist with re-scheduling a follow up visit with the RN Case Manager  Follow up plan: Telephone appointment with care management team member scheduled for: 09/08/2021  Burman Nieves, CCMA Care Guide, Embedded Care Coordination [facility]  Care Management  Direct Dial: [phone]

## 2021-09-04 NOTE — Telephone Encounter (Signed)
Called Pt and Pt stated not sick anymore . Pt was advised

## 2021-09-07 NOTE — Telephone Encounter (Signed)
Documentation in other message. Per pcp non of her medications should be causing diarrhea, she was advised of this last week. Patient also reported last week her diarrhea was better. She is still calling with other problems. Other message is still active with this information.

## 2021-09-07 NOTE — Telephone Encounter (Signed)
Called patient a few times but no answer. Lvm for patient to call back.  

## 2021-09-07 NOTE — Telephone Encounter (Signed)
Pt states she is still sick and does not want to take any of her medications.

## 2021-09-07 NOTE — Telephone Encounter (Signed)
Pt is still needing to discuss this with pcp. Pt is still very confused. Please advise.

## 2021-09-08 ENCOUNTER — Ambulatory Visit (INDEPENDENT_AMBULATORY_CARE_PROVIDER_SITE_OTHER): Payer: Medicare PPO

## 2021-09-08 DIAGNOSIS — J449 Chronic obstructive pulmonary disease, unspecified: Secondary | ICD-10-CM

## 2021-09-08 DIAGNOSIS — K219 Gastro-esophageal reflux disease without esophagitis: Secondary | ICD-10-CM

## 2021-09-08 DIAGNOSIS — F4321 Adjustment disorder with depressed mood: Secondary | ICD-10-CM

## 2021-09-08 DIAGNOSIS — E782 Mixed hyperlipidemia: Secondary | ICD-10-CM

## 2021-09-08 DIAGNOSIS — M109 Gout, unspecified: Secondary | ICD-10-CM

## 2021-09-08 DIAGNOSIS — E039 Hypothyroidism, unspecified: Secondary | ICD-10-CM

## 2021-09-08 DIAGNOSIS — I10 Essential (primary) hypertension: Secondary | ICD-10-CM

## 2021-09-08 NOTE — Patient Instructions (Signed)
Visit Information  Thank you for allowing me to share the care management and care coordination services that are available to you as part of your health plan and services through your primary care provider and medical home. Please reach out to me at [phone] if the care management/care coordination team may be of assistance to you in the future.   George Ina RN,BSN,CCM RN Case Manager [phone]

## 2021-09-08 NOTE — Chronic Care Management (AMB) (Signed)
  Care Management   Outreach Note  09/08/2021 Name: Veronica Wolf MRN: [id number] DOB: March 05, 1941  Referred by: Debbrah Alar, NP Reason for referral : Chronic Care Management   Successful outreach was made to patient today.  HIPAA verified.  Patient states she does not feel well today and request to reschedule telephone appointment.    Follow Up Plan:  The patient has been provided with contact information for the care management team and has been advised to call with any health related questions or concerns.  The care management team will reach out to the patient again over the next 45 days.   Quinn Plowman RN,BSN,CCM RN Case Manager [phone]

## 2021-09-09 NOTE — Telephone Encounter (Signed)
Called patient and she reports "she is feeling well and was wondering why does she need to take medications if she is feeling well. She is still taking them even if she is feeling well" Patient was advised the reason why she is stable is probably because she is taking her medications as prescribed and to continue to do that.  She verbalized understanding and states "she will continue to take them even if everything is ok"

## 2021-09-14 ENCOUNTER — Other Ambulatory Visit (HOSPITAL_COMMUNITY)
Admission: RE | Admit: 2021-09-14 | Discharge: 2021-09-14 | Disposition: A | Discharge status: Home / Self Care | Payer: Medicare PPO | Source: Ambulatory Visit | Attending: Family | Admitting: Family

## 2021-09-14 ENCOUNTER — Ambulatory Visit (INDEPENDENT_AMBULATORY_CARE_PROVIDER_SITE_OTHER): Payer: Medicare PPO | Admitting: Family

## 2021-09-14 VITALS — BP 110/57 | HR 74 | Temp 97.7°F | Resp 16 | Wt 190.0 lb

## 2021-09-14 DIAGNOSIS — N760 Acute vaginitis: Secondary | ICD-10-CM | POA: Diagnosis not present

## 2021-09-14 NOTE — Progress Notes (Deleted)
Subjective:     Patient ID: Veronica Wolf, female    DOB: 06-16-1940, 81 y.o.   MRN: [id number]  Chief Complaint  Patient presents with   Vaginal Pain    Complains of vaginal irritation and soarness     HPI Patient is in today for ***  Health Maintenance Due  Topic Date Due   Zoster Vaccines- Shingrix (1 of 2) Never done   COVID-19 Vaccine (3 - Pfizer risk series) 07/06/2019    Past Medical History:  Diagnosis Date   Allergic rhinitis    Allergy    seasonal allergies   Anxiety    CKD (chronic kidney disease) 04/04/2017   COPD (chronic obstructive pulmonary disease) (HCC)    uses inhaler   Depression    Fatty liver    GERD (gastroesophageal reflux disease)    Gout    hx of   Hyperlipidemia    on meds   Hypertension    on meds   Myocardial infarction [facility])    Peptic ulcer 01/04/2003   Vertigo     Past Surgical History:  Procedure Laterality Date   ABDOMINAL HYSTERECTOMY     APPENDECTOMY     BIOPSY  08/13/2020   Procedure: BIOPSY;  Surgeon: Lavena Bullion, DO;  [facility]: [facility];  Service: Gastroenterology;;   ESOPHAGOGASTRODUODENOSCOPY (EGD) WITH PROPOFOL N/A 08/13/2020   Procedure: ESOPHAGOGASTRODUODENOSCOPY (EGD) WITH PROPOFOL;  Surgeon: Lavena Bullion, DO;  [facility]: [facility];  Service: Gastroenterology;  Laterality: N/A;   LEFT HEART CATH AND CORONARY ANGIOGRAPHY N/A 06/13/2017   Procedure: LEFT HEART CATH AND CORONARY ANGIOGRAPHY;  Surgeon: Leonie Man, MD;  Location: [place] [facility];  Service: Cardiovascular;  Laterality: N/A;   RIGHT/LEFT HEART CATH AND CORONARY ANGIOGRAPHY N/A 09/16/2017   Procedure: RIGHT/LEFT HEART CATH AND CORONARY ANGIOGRAPHY;  Surgeon: Martinique, Peter M, MD;  Location: [place] [facility];  Service: Cardiovascular;  Laterality: N/A;   SAVORY DILATION N/A 08/13/2020   Procedure: SAVORY DILATION;  Surgeon: Lavena Bullion, DO;  [facility];  Service: Gastroenterology;  Laterality: N/A;   ULTRASOUND  GUIDANCE FOR VASCULAR ACCESS  09/16/2017   Procedure: Ultrasound Guidance For Vascular Access;  Surgeon: Martinique, Peter M, MD;  Location: [place] [facility];  Service: Cardiovascular;;   WISDOM TOOTH EXTRACTION      Family History  Problem Relation Age of Onset   Breast cancer Mother    Stroke Father    Stomach cancer Neg Hx    Colon cancer Neg Hx    Pancreatic cancer Neg Hx    Esophageal cancer Neg Hx    Colon polyps Neg Hx    Rectal cancer Neg Hx     Social History   Socioeconomic History   Marital status: Widowed    Spouse name: Not on file   Number of children: 1   Years of education: 58   Highest education level: 12th grade  Occupational History   Occupation: Retired  Tobacco Use   Smoking status: Former    Types: Cigarettes    Quit date: 04/24/2000    Years since quitting: 21.4    Passive exposure: Past   Smokeless tobacco: Never  Vaping Use   Vaping Use: Never used  Substance and Sexual Activity   Alcohol use: Not Currently    Comment: has previous hx of ETOH abuse, quit 2014   Drug use: No   Sexual activity: Not Currently  Other Topics Concern   Not on file  Social History Narrative   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[place]   Grew up in [place]   Has daughter locally   [place] Strain: [place]  (05/13/2021)   Overall Financial Resource Strain (CARDIA)    Difficulty of Paying Living Expenses: Not very hard  Food Insecurity: No Food Insecurity (05/13/2021)   Hunger Vital Sign    Worried About Running Out of Food in the Last Year: Never true    Ran Out of Food in the Last Year: Never true  Transportation Needs: No Transportation Needs (05/13/2021)   PRAPARE - Hydrologist (Medical): No    Lack of Transportation (Non-Medical): No  Recent Concern: Transportation Needs - Unmet Transportation Needs (03/09/2021)   PRAPARE - Transportation    Lack of Transportation (Medical): Yes    Lack  of Transportation (Non-Medical): Yes  Physical Activity: Inactive (05/13/2021)   Exercise Vital Sign    Days of Exercise per Week: 0 days    Minutes of Exercise per Session: 0 min  Stress: Stress Concern Present (05/13/2021)   [place]    Feeling of Stress : Rather much  Social Connections: Moderately Isolated (05/13/2021)   Social Connection and Isolation Panel [NHANES]    Frequency of Communication with Friends and Family: More than three times a week    Frequency of Social Gatherings with Friends and Family: More than three times a week    Attends Religious Services: 1 to 4 times per year    Active Member of [employer] or Organizations: No    Attends Archivist Meetings: Never    Marital Status: Widowed  Intimate Partner Violence: Not At Risk (05/13/2021)   Humiliation, Afraid, Rape, and Kick questionnaire    Fear of Current or Ex-Partner: No    Emotionally Abused: No    Physically Abused: No    Sexually Abused: No    Outpatient Medications Prior to Visit  Medication Sig Dispense Refill   albuterol (PROVENTIL) (2.5 MG/3ML) 0.083% nebulizer solution USE 1 VIAL IN NEBULIZER EVERY 6 HOURS AS NEEDED FOR WHEEZING AND FOR SHORTNESS OF BREATH 150 mL 0   albuterol (VENTOLIN HFA) 108 (90 Base) MCG/ACT inhaler Inhale 2 puffs into the lungs every 6 (six) hours as needed for wheezing or shortness of breath. 8 g 2   allopurinol (ZYLOPRIM) 100 MG tablet TAKE TWO TABLETS BY MOUTH ONCE DAILY 180 tablet 1   aspirin 81 MG EC tablet Take 1 tablet (81 mg total) by mouth daily. Swallow whole. 30 tablet 12   atorvastatin (LIPITOR) 80 MG tablet Take 1 tablet (80 mg total) by mouth daily. 90 tablet 3   colchicine 0.6 MG tablet Take 0.6 mg by mouth daily as needed (gout).     diazepam (VALIUM) 10 MG tablet TAKE ONE TABLET BY MOUTH EVERY TWELVE HOURS AS NEEDED FOR ANXIETY 45 tablet 0   dicyclomine (BENTYL) 10 MG capsule Take 1 capsule (10 mg  total) by mouth in the morning, at noon, in the evening, and at bedtime. 360 capsule 1   escitalopram (LEXAPRO) 20 MG tablet Take 1 tablet (20 mg total) by mouth daily. 90 tablet 1   famotidine (PEPCID) 20 MG tablet TAKE ONE TABLET BY MOUTH EVERY MORNING and TAKE ONE TABLET BY MOUTH EVERYDAY AT BEDTIME 60 tablet 2   isosorbide mononitrate (IMDUR) 30 MG 24 hr tablet Take 1 tablet (30 mg total)  by mouth daily. 90 tablet 1   levothyroxine (SYNTHROID) 25 MCG tablet TAKE ONE TABLET BY MOUTH EVERY MORNING 90 tablet 1   lisinopril (ZESTRIL) 2.5 MG tablet Take 1 tablet (2.5 mg total) by mouth daily. 90 tablet 3   meclizine (ANTIVERT) 25 MG tablet TAKE ONE TABLET BY MOUTH twice daily AS NEEDED FOR dizziness 30 tablet 1   metoprolol succinate (TOPROL-XL) 25 MG 24 hr tablet Take 0.5 tablets (12.5 mg total) by mouth daily. 45 tablet 3   nitroGLYCERIN (NITROSTAT) 0.4 MG SL tablet Place 1 tablet (0.4 mg total) under the tongue every 5 (five) minutes as needed for chest pain. 45 tablet 3   nystatin (MYCOSTATIN/NYSTOP) powder APPLY POWDER TOPICALLY TO AFFECTED AREA 2 TIMES DAILY 60 g 4   pantoprazole (PROTONIX) 40 MG tablet TAKE ONE TABLET BY MOUTH TWICE DAILY 180 tablet 0   potassium chloride SA (KLOR-CON M) 20 MEQ tablet TAKE ONE TABLET BY MOUTH AT NOON 90 tablet 1   Respiratory Therapy Supplies (NEBULIZER/TUBING/MOUTHPIECE) KIT Use as directed 1 kit 0   sucralfate (CARAFATE) 1 g tablet TAKE ONE TABLET BY MOUTH FOUR TIMES DAILY. MAY cut pill in half AND DISSOLVE in water prior TO drinking 120 tablet 0   ezetimibe (ZETIA) 10 MG tablet Take 1 tablet (10 mg total) by mouth daily. 90 tablet 3   No facility-administered medications prior to visit.    Allergies  Allergen Reactions   Ibuprofen Other (See Comments)    Pt has hx of peptic ulcer and diverticulitis.     ROS     Objective:    Physical Exam  There were no vitals taken for this visit. Wt Readings from Last 3 Encounters:  08/26/21 188 lb (85.3  kg)  04/13/21 182 lb (82.6 kg)  02/16/21 182 lb (82.6 kg)       Assessment & Plan:   Problem List Items Addressed This Visit   None   I am having Tiarah P. Nation "Mardene Celeste" maintain her colchicine, aspirin EC, nitroGLYCERIN, albuterol, atorvastatin, dicyclomine, escitalopram, ezetimibe, lisinopril, metoprolol succinate, sucralfate, potassium chloride SA, nystatin, isosorbide mononitrate, pantoprazole, meclizine, levothyroxine, allopurinol, famotidine, albuterol, Nebulizer/Tubing/Mouthpiece, and diazepam.  No orders of the defined types were placed in this encounter.

## 2021-09-14 NOTE — Assessment & Plan Note (Signed)
New.  Will check HSV2 IgG.  Swab obtained and will be sent for BV/Yeast.  I suspect that her symptoms may be due to severe atrophic vaginitis. If lab testing is unremarkable, will plan treatment with estrogen cream.

## 2021-09-14 NOTE — Progress Notes (Signed)
Subjective:   By signing my name below, I, [name], attest that this documentation has been prepared under the direction and in the presence of Debbrah Alar, NP 09/14/2021    Patient ID: Veronica Wolf, female    DOB: 1940-12-24, 81 y.o.   MRN: [id number]  Chief Complaint  Patient presents with   Vaginal Pain    Complains of vaginal irritation and soarness     Vaginal Pain Associated symptoms include abdominal pain (epigastric). Pertinent negatives include no diarrhea.   Patient is in today for a office visit. She is present with her daughter during this visit.   Vaginal pain- She complains of vaginal pain for the past couple of weeks.  Epigastric pain- She also reports her epigastric pain is also flaring up.  Diarrhea- She reports having mild diarrhea yesterday night but her symptoms of resolved at this time.    Health Maintenance Due  Topic Date Due   Zoster Vaccines- Shingrix (1 of 2) Never done   COVID-19 Vaccine (3 - Pfizer risk series) 07/06/2019    Past Medical History:  Diagnosis Date   Allergic rhinitis    Allergy    seasonal allergies   Anxiety    CKD (chronic kidney disease) 04/04/2017   COPD (chronic obstructive pulmonary disease) (HCC)    uses inhaler   Depression    Fatty liver    GERD (gastroesophageal reflux disease)    Gout    hx of   Hyperlipidemia    on meds   Hypertension    on meds   Myocardial infarction [facility])    Peptic ulcer 01/04/2003   Vertigo     Past Surgical History:  Procedure Laterality Date   ABDOMINAL HYSTERECTOMY     APPENDECTOMY     BIOPSY  08/13/2020   Procedure: BIOPSY;  Surgeon: Lavena Bullion, DO;  [facility];  Service: Gastroenterology;;   ESOPHAGOGASTRODUODENOSCOPY (EGD) WITH PROPOFOL N/A 08/13/2020   Procedure: ESOPHAGOGASTRODUODENOSCOPY (EGD) WITH PROPOFOL;  Surgeon: Lavena Bullion, DO;  [facility];  Service: Gastroenterology;  Laterality: N/A;   LEFT HEART CATH AND  CORONARY ANGIOGRAPHY N/A 06/13/2017   Procedure: LEFT HEART CATH AND CORONARY ANGIOGRAPHY;  Surgeon: Leonie Man, MD;  Location: [place] [facility];  Service: Cardiovascular;  Laterality: N/A;   RIGHT/LEFT HEART CATH AND CORONARY ANGIOGRAPHY N/A 09/16/2017   Procedure: RIGHT/LEFT HEART CATH AND CORONARY ANGIOGRAPHY;  Surgeon: Martinique, Peter M, MD;  Location: [place] [facility];  Service: Cardiovascular;  Laterality: N/A;   SAVORY DILATION N/A 08/13/2020   Procedure: SAVORY DILATION;  Surgeon: Lavena Bullion, DO;  [facility];  Service: Gastroenterology;  Laterality: N/A;   ULTRASOUND GUIDANCE FOR VASCULAR ACCESS  09/16/2017   Procedure: Ultrasound Guidance For Vascular Access;  Surgeon: Martinique, Peter M, MD;  Location: [place] [facility];  Service: Cardiovascular;;   WISDOM TOOTH EXTRACTION      Family History  Problem Relation Age of Onset   Breast cancer Mother    Stroke Father    Stomach cancer Neg Hx    Colon cancer Neg Hx    Pancreatic cancer Neg Hx    Esophageal cancer Neg Hx    Colon polyps Neg Hx    Rectal cancer Neg Hx     Social History   Socioeconomic History   Marital status: Widowed    Spouse name: Not on file   Number of children: 1   Years of education: 12   Highest education  level: 12th grade  Occupational History   Occupation: Retired  Tobacco Use   Smoking status: Former    Types: Cigarettes    Quit date: 04/24/2000    Years since quitting: 21.4    Passive exposure: Past   Smokeless tobacco: Never  Vaping Use   Vaping Use: Never used  Substance and Sexual Activity   Alcohol use: Not Currently    Comment: has previous hx of ETOH abuse, quit 2014   Drug use: No   Sexual activity: Not Currently  Other Topics Concern   Not on file  Social History Narrative   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[place]   Grew up in [place]   Has daughter locally   Social Determinants of Health   Financial Resource Strain: [place]  (05/13/2021)    Overall Financial Resource Strain (CARDIA)    Difficulty of Paying Living Expenses: Not very hard  Food Insecurity: No Food Insecurity (05/13/2021)   Hunger Vital Sign    Worried About Running Out of Food in the Last Year: Never true    [place] in the Last Year: Never true  Transportation Needs: No Transportation Needs (05/13/2021)   PRAPARE - Hydrologist (Medical): No    Lack of Transportation (Non-Medical): No  Recent Concern: Transportation Needs - Unmet Transportation Needs (03/09/2021)   PRAPARE - Transportation    Lack of Transportation (Medical): Yes    Lack of Transportation (Non-Medical): Yes  Physical Activity: Inactive (05/13/2021)   Exercise Vital Sign    Days of Exercise per Week: 0 days    Minutes of Exercise per Session: 0 min  Stress: Stress Concern Present (05/13/2021)   [place]    Feeling of Stress : Rather much  Social Connections: Moderately Isolated (05/13/2021)   Social Connection and Isolation Panel [NHANES]    Frequency of Communication with Friends and Family: More than three times a week    Frequency of Social Gatherings with Friends and Family: More than three times a week    Attends Religious Services: 1 to 4 times per year    Active Member of [employer] or Organizations: No    Attends Archivist Meetings: Never    Marital Status: Widowed  Intimate Partner Violence: Not At Risk (05/13/2021)   Humiliation, Afraid, Rape, and Kick questionnaire    Fear of Current or Ex-Partner: No    Emotionally Abused: No    Physically Abused: No    Sexually Abused: No    Outpatient Medications Prior to Visit  Medication Sig Dispense Refill   albuterol (PROVENTIL) (2.5 MG/3ML) 0.083% nebulizer solution USE 1 VIAL IN NEBULIZER EVERY 6 HOURS AS NEEDED FOR WHEEZING AND FOR SHORTNESS OF BREATH 150 mL 0   albuterol (VENTOLIN HFA) 108 (90 Base) MCG/ACT inhaler Inhale 2 puffs  into the lungs every 6 (six) hours as needed for wheezing or shortness of breath. 8 g 2   allopurinol (ZYLOPRIM) 100 MG tablet TAKE TWO TABLETS BY MOUTH ONCE DAILY 180 tablet 1   aspirin 81 MG EC tablet Take 1 tablet (81 mg total) by mouth daily. Swallow whole. 30 tablet 12   atorvastatin (LIPITOR) 80 MG tablet Take 1 tablet (80 mg total) by mouth daily. 90 tablet 3   colchicine 0.6 MG tablet Take 0.6 mg by mouth daily as needed (gout).     diazepam (VALIUM) 10 MG tablet TAKE ONE TABLET BY MOUTH  EVERY TWELVE HOURS AS NEEDED FOR ANXIETY 45 tablet 0   dicyclomine (BENTYL) 10 MG capsule Take 1 capsule (10 mg total) by mouth in the morning, at noon, in the evening, and at bedtime. 360 capsule 1   escitalopram (LEXAPRO) 20 MG tablet Take 1 tablet (20 mg total) by mouth daily. 90 tablet 1   famotidine (PEPCID) 20 MG tablet TAKE ONE TABLET BY MOUTH EVERY MORNING and TAKE ONE TABLET BY MOUTH EVERYDAY AT BEDTIME 60 tablet 2   isosorbide mononitrate (IMDUR) 30 MG 24 hr tablet Take 1 tablet (30 mg total) by mouth daily. 90 tablet 1   levothyroxine (SYNTHROID) 25 MCG tablet TAKE ONE TABLET BY MOUTH EVERY MORNING 90 tablet 1   lisinopril (ZESTRIL) 2.5 MG tablet Take 1 tablet (2.5 mg total) by mouth daily. 90 tablet 3   meclizine (ANTIVERT) 25 MG tablet TAKE ONE TABLET BY MOUTH twice daily AS NEEDED FOR dizziness 30 tablet 1   metoprolol succinate (TOPROL-XL) 25 MG 24 hr tablet Take 0.5 tablets (12.5 mg total) by mouth daily. 45 tablet 3   nitroGLYCERIN (NITROSTAT) 0.4 MG SL tablet Place 1 tablet (0.4 mg total) under the tongue every 5 (five) minutes as needed for chest pain. 45 tablet 3   nystatin (MYCOSTATIN/NYSTOP) powder APPLY POWDER TOPICALLY TO AFFECTED AREA 2 TIMES DAILY 60 g 4   pantoprazole (PROTONIX) 40 MG tablet TAKE ONE TABLET BY MOUTH TWICE DAILY 180 tablet 0   potassium chloride SA (KLOR-CON M) 20 MEQ tablet TAKE ONE TABLET BY MOUTH AT NOON 90 tablet 1   Respiratory Therapy Supplies  (NEBULIZER/TUBING/MOUTHPIECE) KIT Use as directed 1 kit 0   sucralfate (CARAFATE) 1 g tablet TAKE ONE TABLET BY MOUTH FOUR TIMES DAILY. MAY cut pill in half AND DISSOLVE in water prior TO drinking 120 tablet 0   ezetimibe (ZETIA) 10 MG tablet Take 1 tablet (10 mg total) by mouth daily. 90 tablet 3   No facility-administered medications prior to visit.    Allergies  Allergen Reactions   Ibuprofen Other (See Comments)    Pt has hx of peptic ulcer and diverticulitis.     Review of Systems  Gastrointestinal:  Positive for abdominal pain (epigastric). Negative for diarrhea.  Genitourinary:  Positive for vaginal pain.       (+)vaginal pain       Objective:    Physical Exam Constitutional:      General: She is not in acute distress.    Appearance: Normal appearance. She is not ill-appearing.  HENT:     Head: Normocephalic and atraumatic.     Right Ear: External ear normal.     Left Ear: External ear normal.  Eyes:     Extraocular Movements: Extraocular movements intact.     Pupils: Pupils are equal, round, and reactive to light.  Cardiovascular:     Rate and Rhythm: Normal rate and regular rhythm.     Heart sounds: Normal heart sounds. No murmur heard.    No gallop.  Pulmonary:     Effort: Pulmonary effort is normal. No respiratory distress.     Breath sounds: Normal breath sounds. No wheezing or rales.  Abdominal:     Palpations: Abdomen is soft.     Tenderness: There is abdominal tenderness (epigastric).  Genitourinary:    Comments: Vaginal tissues were very red and raw appearing Skin:    General: Skin is warm and dry.  Neurological:     Mental Status: She is alert and oriented to person,  place, and time.  Psychiatric:        Judgment: Judgment normal.     BP (!) 110/57 (BP Location: Right Arm, Patient Position: Sitting, Cuff Size: Small)   Pulse 74   Temp 97.7 F (36.5 C) (Temporal)   Resp 16   Wt 190 lb (86.2 kg)   SpO2 98%   BMI 32.61 kg/m  Wt Readings  from Last 3 Encounters:  09/14/21 190 lb (86.2 kg)  08/26/21 188 lb (85.3 kg)  04/13/21 182 lb (82.6 kg)       Assessment & Plan:   Problem List Items Addressed This Visit       Unprioritized   Acute vaginitis - Primary    New.  Will check HSV2 IgG.  Swab obtained and will be sent for BV/Yeast.  I suspect that her symptoms may be due to severe atrophic vaginitis. If lab testing is unremarkable, will plan treatment with estrogen cream.       Relevant Orders   Cervicovaginal ancillary only( [place])   HSV 2 antibody, IgG     No orders of the defined types were placed in this encounter.   I, Nance Pear, NP, personally preformed the services described in this documentation.  All medical record entries made by the scribe were at my direction and in my presence.  I have reviewed the chart and discharge instructions (if applicable) and agree that the record reflects my personal performance and is accurate and complete. 09/14/2021   I,[name],acting as a Education administrator for Nance Pear, NP.,have documented all relevant documentation on the behalf of Nance Pear, NP,as directed by  Nance Pear, NP while in the presence of Nance Pear, NP.   Nance Pear, NP

## 2021-09-15 ENCOUNTER — Ambulatory Visit: Payer: Medicare PPO

## 2021-09-15 DIAGNOSIS — I10 Essential (primary) hypertension: Secondary | ICD-10-CM

## 2021-09-15 DIAGNOSIS — J449 Chronic obstructive pulmonary disease, unspecified: Secondary | ICD-10-CM

## 2021-09-15 LAB — CERVICOVAGINAL ANCILLARY ONLY
Bacterial Vaginitis (gardnerella): NEGATIVE
Candida Glabrata: NEGATIVE
Candida Vaginitis: NEGATIVE
Comment: NEGATIVE
Comment: NEGATIVE
Comment: NEGATIVE

## 2021-09-15 LAB — HSV 2 ANTIBODY, IGG: HSV 2 Glycoprotein G Ab, IgG: 11.2 index — ABNORMAL HIGH

## 2021-09-15 NOTE — Chronic Care Management (AMB) (Signed)
Chronic Care Management   CCM RN Visit Note  09/15/2021 Name: Veronica Wolf MRN: [id number] DOB: [birth year]  Subjective: Veronica Wolf is a 81 y.o. year old female who is a primary care patient of Debbrah Alar, NP. The care management team was consulted for assistance with disease management and care coordination needs.    Engaged with patient by telephone for follow up visit in response to provider referral for case management and/or care coordination services.   Consent to Services:  The patient was given information about Chronic Care Management services, agreed to services, and gave verbal consent prior to initiation of services.  Please see initial visit note for detailed documentation.   Patient agreed to services and verbal consent obtained.   Assessment: Review of patient past medical history, allergies, medications, health status, including review of consultants reports, laboratory and other test data, was performed as part of comprehensive evaluation and provision of chronic care management services.   SDOH (Social Determinants of Health) assessments and interventions performed:    CCM Care Plan  Allergies  Allergen Reactions   Ibuprofen Other (See Comments)    Pt has hx of peptic ulcer and diverticulitis.     Outpatient Encounter Medications as of 09/15/2021  Medication Sig Note   albuterol (PROVENTIL) (2.5 MG/3ML) 0.083% nebulizer solution USE 1 VIAL IN NEBULIZER EVERY 6 HOURS AS NEEDED FOR WHEEZING AND FOR SHORTNESS OF BREATH    albuterol (VENTOLIN HFA) 108 (90 Base) MCG/ACT inhaler Inhale 2 puffs into the lungs every 6 (six) hours as needed for wheezing or shortness of breath.    allopurinol (ZYLOPRIM) 100 MG tablet TAKE TWO TABLETS BY MOUTH ONCE DAILY    aspirin 81 MG EC tablet Take 1 tablet (81 mg total) by mouth daily. Swallow whole. 05/07/2021: Patient has ran out but will purchase OTC   atorvastatin (LIPITOR) 80 MG tablet Take 1 tablet (80 mg total) by mouth  daily.    colchicine 0.6 MG tablet Take 0.6 mg by mouth daily as needed (gout).    diazepam (VALIUM) 10 MG tablet TAKE ONE TABLET BY MOUTH EVERY TWELVE HOURS AS NEEDED FOR ANXIETY    dicyclomine (BENTYL) 10 MG capsule Take 1 capsule (10 mg total) by mouth in the morning, at noon, in the evening, and at bedtime.    escitalopram (LEXAPRO) 20 MG tablet Take 1 tablet (20 mg total) by mouth daily.    famotidine (PEPCID) 20 MG tablet TAKE ONE TABLET BY MOUTH EVERY MORNING and TAKE ONE TABLET BY MOUTH EVERYDAY AT BEDTIME    isosorbide mononitrate (IMDUR) 30 MG 24 hr tablet Take 1 tablet (30 mg total) by mouth daily.    levothyroxine (SYNTHROID) 25 MCG tablet TAKE ONE TABLET BY MOUTH EVERY MORNING    lisinopril (ZESTRIL) 2.5 MG tablet Take 1 tablet (2.5 mg total) by mouth daily.    meclizine (ANTIVERT) 25 MG tablet TAKE ONE TABLET BY MOUTH twice daily AS NEEDED FOR dizziness    metoprolol succinate (TOPROL-XL) 25 MG 24 hr tablet Take 0.5 tablets (12.5 mg total) by mouth daily.    nitroGLYCERIN (NITROSTAT) 0.4 MG SL tablet Place 1 tablet (0.4 mg total) under the tongue every 5 (five) minutes as needed for chest pain.    pantoprazole (PROTONIX) 40 MG tablet TAKE ONE TABLET BY MOUTH TWICE DAILY    potassium chloride SA (KLOR-CON M) 20 MEQ tablet TAKE ONE TABLET BY MOUTH AT NOON    sucralfate (CARAFATE) 1 g tablet TAKE ONE TABLET BY MOUTH  FOUR TIMES DAILY. MAY cut pill in half AND DISSOLVE in water prior TO drinking    ezetimibe (ZETIA) 10 MG tablet Take 1 tablet (10 mg total) by mouth daily.    nystatin (MYCOSTATIN/NYSTOP) powder APPLY POWDER TOPICALLY TO AFFECTED AREA 2 TIMES DAILY    Respiratory Therapy Supplies (NEBULIZER/TUBING/MOUTHPIECE) KIT Use as directed    No facility-administered encounter medications on file as of 09/15/2021.    Patient Active Problem List   Diagnosis Date Noted   Acute vaginitis 09/14/2021   Atypical chest pain 08/26/2021   Memory loss 08/26/2021   Cough 04/21/2021    Back pain 12/03/2020   Acute cystitis without hematuria 12/03/2020   Grief reaction 12/03/2020   Fatty liver 12/01/2020   Intermittent left-sided chest pain 09/17/2020   Coronary artery disease involving native coronary artery of native heart 36/14/4315   Metabolic syndrome 40/11/6759   Prediabetes 09/17/2020   Allergy 09/16/2020   Anxiety 09/16/2020   Hypertension 09/16/2020   Vertigo 09/16/2020   Dark stools 09/08/2020   Dysphagia    Esophageal stricture    Hiatal hernia    Epigastric pain    Hypothyroid 01/25/2018   Mild CAD 10/03/2017   Stress-induced cardiomyopathy 09/19/2017   COPD (chronic obstructive pulmonary disease) ([place]) 09/14/2017   Hypokalemia 09/14/2017   Non-ST elevation (NSTEMI) myocardial infarction [facility]) - due to Demand Infarction 09/14/2017   CKD (chronic kidney disease) 04/04/2017   HLD (hyperlipidemia)    Allergic rhinitis    Gout 02/28/2017   Depression with anxiety 02/28/2017   Epigastric abdominal pain 12/22/2016   COPD with chronic bronchitis ([place]) 12/22/2016   GERD (gastroesophageal reflux disease) 12/22/2016   DOE (dyspnea on exertion) 12/22/2016   Peptic ulcer 01/04/2003    Conditions to be addressed/monitored:HTN, COPD, and memory loss  Care Plan : RN Care Manager Plan of Care  Updates made by Luretha Rued, RN since 09/15/2021 12:00 AM     Problem: Chronic disease Managment education and/or care coordination needs   Priority: High     Long-Range Goal: Development of Plan of Care for Chronic Disease Managment and/or care coordination needs   Start Date: 05/04/2021  Expected End Date: 12/16/2021  Priority: High  Note:   Current Barriers:  Care Coordination needs related to Limited social support and Lacks knowledge of community resource: grieving loss of her brother  Chronic Disease Management support and education needs related to HTN and COPD Medication management/adherence-clinical pharmacist involved Does not have blood pressure  monitor-She states she knows where she can get a blood pressure monitor and has made a note to self to obtain. Memory-patient reports she is being referred to a neurologist regarding memory loss. 09/15/21 "I feel pretty good, today. I am just lazy". She reports, vaginal irritation is still present, but improving. States when she has a bowel movement last night that was black. She has not had a bowel movement today. Denies any bright red blood in stool. States she is awaiting a return call from PCP office regarding previous testing and will report black stool at that time. Denies any difficulty with breathing. She is in good spirits today. Expresses that she is glad her daughter was able to attend provider appointment on yesterday and states she has asked her daughter when available. Reports had a very busy weekend with two children graduating. She denies any questions at this time.  RNCM Clinical Goal(s):  Patient will verbalize understanding of plan for management of HTN and COPD as evidenced by self report  and/or chart notatation demonstrate improved adherence to prescribed treatment plan for HTN and COPD as evidenced by self report and/or chart notation continue to work with Consulting civil engineer and/or Social Worker to address care management and care coordination needs related to HTN and COPD as evidenced by adherence to CM Team Scheduled appointments     work with [employer] care guide to address needs related to Limited social support, Transport planner, and [employer] knowledge of community resource: report knowledge of Secondary school teacher as evidenced by patient and/or community resource care guide support    through collaboration with Consulting civil engineer, provider, and care team.   Interventions: 1:1 collaboration with primary care provider regarding development and update of comprehensive plan of care as evidenced by provider attestation and co-signature Inter-disciplinary care team  collaboration (see longitudinal plan of care) Evaluation of current treatment plan related to  self management and patient's adherence to plan as established by provider LCSW referral regarding recent loss of her brother, loss of independence, decrease in social activities-continues to work with [employer] Patient in good spirits today. Reviewed previous resources to increase socialization. Update primary care provider  COPD Interventions:  (Status:  Goal on track:  Yes.) Long Term Goal Medications reviewed. Patient encouraged to continue to take medications as prescribed Reviewed upcoming appointments with patient Encouraged to attend provider visits as scheduled  Hypertension Interventions:  (Status:  Goal on track:  Yes.) Long Term Goal Last practice recorded BP readings:  BP Readings from Last 3 Encounters:  09/14/21 (!) 110/57  08/26/21 125/75  04/21/21 (!) 108/59  Most recent eGFR/CrCl: No results found for: "EGFR"  No components found for: "CRCL"  Medications reviewed with patient and encouraged to continue to take as prescribed  Discussed follow up appointment.  Discussed blood pressure monitor-Patient states she has not forgotten and has plans to obtain blood pressure monitor. Patient has an appointment scheduled with Dr. Harriet Masson on 09/17/21. Ms. Mallozzi asked about Dr. Terrial Rhodes speciality. RNCM informed that Dr. Is a cardiologist(heart doctor). Encouraged to attend provider visits as scheduled  Patient Goals/Self-Care Activities: Take medications as prescribed   Attend all scheduled provider appointments Keep track of how you are feeling and any symptoms. Contact your primary care provider for any health concerns or questions Keep a notepad near you to write down your reminders Obtain blood pressure monitor and check routinely Contact: Marvia Pickles. [facility]: [phone] and [facility] [employer] [phone] for Senior activities programs.  Call your nurse care manager for care  management and/or care coordination needs as needed   Plan:Telephone follow up appointment with care management team member scheduled for:  3 months  Thea Silversmith, RN, MSN, BSN, CCM Care Management Coordinator [place] [employer] [phone]

## 2021-09-15 NOTE — Patient Instructions (Signed)
Visit Information  Thank you for taking time to visit with me today. Please don't hesitate to contact me if I can be of assistance to you before our next scheduled telephone appointment.  Following are the goals we discussed today:  Patient Goals/Self-Care Activities: Take medications as prescribed   Attend all scheduled provider appointments Keep track of how you are feeling and any symptoms. Contact your primary care provider for any health concerns or questions Keep a notepad near you to write down your reminders Obtain blood pressure monitor and check routinely Contact: Neita Carp. [facility]: [phone] and [facility] [employer] [phone] for Senior activities programs.  Call your nurse care manager for care management and/or care coordination needs as needed  Our next appointment is by telephone on 12/15/21 at 10 am  Please call the care guide team at [phone] if you need to cancel or reschedule your appointment.   If you are experiencing a Mental Health or Behavioral Health Crisis or need someone to talk to, please call the Suicide and Crisis Lifeline: 988 call 1-800-273-TALK (toll free, 24 hour hotline)   The patient verbalized understanding of instructions, educational materials, and care plan provided today and agreed to receive a mailed copy of patient instructions, educational materials, and care plan.   Kathyrn Sheriff, RN, MSN, BSN, CCM Care Management Coordinator [facility] [phone]   Memory Compensation Strategies  Use "WARM" strategy.  W= write it down  A= associate it  R= repeat it  M= make a mental note  2.   You can keep a Glass blower/designer.  Use a 3-ring notebook with sections for the following: calendar, important names and phone numbers,  medications, doctors' names/phone numbers, lists/reminders, and a section to journal what you did  each day.   3.    Use a calendar to write appointments down.  4.    Write yourself a  schedule for the day.  This can be placed on the calendar or in a separate section of the Memory Notebook.  Keeping a  regular schedule can help memory.  5.    Use medication organizer with sections for each day or morning/evening pills.  You may need help loading it  6.    Keep a basket, or pegboard by the door.  Place items that you need to take out with you in the basket or on the pegboard.  You may also want to  include a message board for reminders.  7.    Use sticky notes.  Place sticky notes with reminders in a place where the task is performed.  For example: " turn off the  stove" placed by the stove, "lock the door" placed on the door at eye level, " take your medications" on  the bathroom mirror or by the place where you normally take your medications.  8.    Use alarms/timers.  Use while cooking to remind yourself to check on food or as a reminder to take your medicine, or as a  reminder to make a call, or as a reminder to perform another task, etc.

## 2021-09-16 ENCOUNTER — Other Ambulatory Visit: Payer: Self-pay

## 2021-09-16 ENCOUNTER — Telehealth: Payer: Self-pay | Admitting: Family

## 2021-09-16 MED ORDER — VALACYCLOVIR HCL 500 MG PO TABS: 500.0000 mg | ORAL_TABLET | Freq: Two times a day (BID) | ORAL | 0 refills | Status: DC

## 2021-09-16 NOTE — Telephone Encounter (Signed)
Please advise pt that I reviewed the note from the RN:  Update - Veronica Wolf overall is in good spirits and is feeling better today. She states the vaginal irritation is still present, but has improved. She also express that she noticed black stool with her bowel movement last night. She has not had a bowel movement today. Thank you.   I would like her to complete an IFOB and CBC please. Dx black stool.   Also, her testing came back positive for herpes type 2.  She has likely had this since she was young. This could be the reason she is having vaginal pain. I would like for her to start valtex twice to see if this helps.

## 2021-09-16 NOTE — Telephone Encounter (Signed)
Patient returned phone call to discuss symptoms/herpes. Please advise

## 2021-09-16 NOTE — Telephone Encounter (Signed)
Patient advised of result and provider's comments. She reports she had taken peptobismol and stools usually get dark after taking. She was advised ok per pcp we will hold off on IFOB and labs and let us know if she ever has dark stool with out taking peptobismol.  She verbalized understanding.

## 2021-09-17 ENCOUNTER — Encounter: Payer: Self-pay | Admitting: Cardiology

## 2021-09-17 ENCOUNTER — Ambulatory Visit (INDEPENDENT_AMBULATORY_CARE_PROVIDER_SITE_OTHER): Payer: Medicare PPO | Admitting: Cardiology

## 2021-09-17 VITALS — BP 130/70 | HR 63 | Ht 64.0 in | Wt 189.8 lb

## 2021-09-17 DIAGNOSIS — E782 Mixed hyperlipidemia: Secondary | ICD-10-CM

## 2021-09-17 DIAGNOSIS — I10 Essential (primary) hypertension: Secondary | ICD-10-CM

## 2021-09-17 DIAGNOSIS — I2510 Atherosclerotic heart disease of native coronary artery without angina pectoris: Secondary | ICD-10-CM | POA: Diagnosis not present

## 2021-09-17 DIAGNOSIS — I272 Pulmonary hypertension, unspecified: Secondary | ICD-10-CM | POA: Diagnosis not present

## 2021-09-17 MED ORDER — ISOSORBIDE MONONITRATE ER 60 MG PO TB24: 60.0000 mg | ORAL_TABLET | Freq: Every day | ORAL | 3 refills | Status: DC

## 2021-09-17 NOTE — Patient Instructions (Addendum)
Medication Instructions:  Your physician has recommended you make the following change in your medication:  INCREASE: Imdur 60 mg once daily  *If you need a refill on your cardiac medications before your next appointment, please call your pharmacy*   Lab Work: None If you have labs (blood work) drawn today and your tests are completely normal, you will receive your results only by: MyChart Message (if you have MyChart) OR A paper copy in the mail If you have any lab test that is abnormal or we need to change your treatment, we will call you to review the results.   Testing/Procedures: None   Follow-Up: At [facility], you and your health needs are our priority.  As part of our continuing mission to provide you with exceptional heart care, we have created designated Provider Care Teams.  These Care Teams include your primary Cardiologist (physician) and Advanced Practice Providers (APPs -  Physician Assistants and Nurse Practitioners) who all work together to provide you with the care you need, when you need it.  We recommend signing up for the patient portal called "MyChart".  Sign up information is provided on this After Visit Summary.  MyChart is used to connect with patients for Virtual Visits (Telemedicine).  Patients are able to view lab/test results, encounter notes, upcoming appointments, etc.  Non-urgent messages can be sent to your provider as well.   To learn more about what you can do with MyChart, go to [email or web address].    Your next appointment:   4 month(s)  The format for your next appointment:   In Person  Provider:   Thomasene Ripple, DO    Other Instructions   Important Information About Sugar

## 2021-09-17 NOTE — Progress Notes (Signed)
Cardiology Office Note:    Date:  09/17/2021   ID:  [name], DOB 1941/01/27, MRN [id number]  PCP:  Debbrah Alar, NP  Cardiologist:  Berniece Salines, DO  Electrophysiologist:  None   Referring MD: Debbrah Alar, NP   " I am having chest pain"  History of Present Illness:    Veronica Wolf is a 81 y.o. female with a hx of of coronary artery disease and coronary angiography revealed mild coronary artery disease in 2019, Mixed hyperlipidemia, CKD, hypertension.   I saw the patient on 09/17/2020 at that she was experiencing chest pain.During that visit I recommended a nuclear stress. She was not able to get this testing done.  Today she tells me that she is still experiencing mid-sternal chest pain. No other complaints at this time.   Past Medical History:  Diagnosis Date   Allergic rhinitis    Allergy    seasonal allergies   Anxiety    CKD (chronic kidney disease) 04/04/2017   COPD (chronic obstructive pulmonary disease) (HCC)    uses inhaler   Depression    Fatty liver    GERD (gastroesophageal reflux disease)    Gout    hx of   Hyperlipidemia    on meds   Hypertension    on meds   Myocardial infarction [facility])    Peptic ulcer 01/04/2003   Vertigo     Past Surgical History:  Procedure Laterality Date   ABDOMINAL HYSTERECTOMY     APPENDECTOMY     BIOPSY  08/13/2020   Procedure: BIOPSY;  Surgeon: Lavena Bullion, DO;  Location: [facility];  Service: Gastroenterology;;   ESOPHAGOGASTRODUODENOSCOPY (EGD) WITH PROPOFOL N/A 08/13/2020   Procedure: ESOPHAGOGASTRODUODENOSCOPY (EGD) WITH PROPOFOL;  Surgeon: Lavena Bullion, DO;  Location: [facility];  Service: Gastroenterology;  Laterality: N/A;   LEFT HEART CATH AND CORONARY ANGIOGRAPHY N/A 06/13/2017   Procedure: LEFT HEART CATH AND CORONARY ANGIOGRAPHY;  Surgeon: Leonie Man, MD;  Location: [place] [facility];  Service: Cardiovascular;  Laterality: N/A;   RIGHT/LEFT HEART CATH AND CORONARY  ANGIOGRAPHY N/A 09/16/2017   Procedure: RIGHT/LEFT HEART CATH AND CORONARY ANGIOGRAPHY;  Surgeon: Martinique, Peter M, MD;  Location: [place] [facility];  Service: Cardiovascular;  Laterality: N/A;   SAVORY DILATION N/A 08/13/2020   Procedure: SAVORY DILATION;  Surgeon: Lavena Bullion, DO;  Location: [facility];  Service: Gastroenterology;  Laterality: N/A;   ULTRASOUND GUIDANCE FOR VASCULAR ACCESS  09/16/2017   Procedure: Ultrasound Guidance For Vascular Access;  Surgeon: Martinique, Peter M, MD;  Location: [place] [facility];  Service: Cardiovascular;;   WISDOM TOOTH EXTRACTION      Current Medications: Current Meds  Medication Sig   albuterol (PROVENTIL) (2.5 MG/3ML) 0.083% nebulizer solution USE 1 VIAL IN NEBULIZER EVERY 6 HOURS AS NEEDED FOR WHEEZING AND FOR SHORTNESS OF BREATH   albuterol (VENTOLIN HFA) 108 (90 Base) MCG/ACT inhaler Inhale 2 puffs into the lungs every 6 (six) hours as needed for wheezing or shortness of breath.   allopurinol (ZYLOPRIM) 100 MG tablet TAKE TWO TABLETS BY MOUTH ONCE DAILY   aspirin 81 MG EC tablet Take 1 tablet (81 mg total) by mouth daily. Swallow whole.   atorvastatin (LIPITOR) 80 MG tablet Take 1 tablet (80 mg total) by mouth daily.   colchicine 0.6 MG tablet Take 0.6 mg by mouth daily as needed (gout).   diazepam (VALIUM) 10 MG tablet TAKE ONE TABLET BY MOUTH EVERY TWELVE HOURS AS NEEDED FOR ANXIETY  dicyclomine (BENTYL) 10 MG capsule Take 1 capsule (10 mg total) by mouth in the morning, at noon, in the evening, and at bedtime.   escitalopram (LEXAPRO) 20 MG tablet Take 1 tablet (20 mg total) by mouth daily.   famotidine (PEPCID) 20 MG tablet TAKE ONE TABLET BY MOUTH EVERY MORNING and TAKE ONE TABLET BY MOUTH EVERYDAY AT BEDTIME   isosorbide mononitrate (IMDUR) 60 MG 24 hr tablet Take 1 tablet (60 mg total) by mouth daily.   levothyroxine (SYNTHROID) 25 MCG tablet TAKE ONE TABLET BY MOUTH EVERY MORNING   lisinopril (ZESTRIL) 2.5 MG tablet Take 1 tablet  (2.5 mg total) by mouth daily.   meclizine (ANTIVERT) 25 MG tablet TAKE ONE TABLET BY MOUTH twice daily AS NEEDED FOR dizziness   metoprolol succinate (TOPROL-XL) 25 MG 24 hr tablet Take 0.5 tablets (12.5 mg total) by mouth daily.   nitroGLYCERIN (NITROSTAT) 0.4 MG SL tablet Place 1 tablet (0.4 mg total) under the tongue every 5 (five) minutes as needed for chest pain.   nystatin (MYCOSTATIN/NYSTOP) powder APPLY POWDER TOPICALLY TO AFFECTED AREA 2 TIMES DAILY   pantoprazole (PROTONIX) 40 MG tablet TAKE ONE TABLET BY MOUTH TWICE DAILY   potassium chloride SA (KLOR-CON M) 20 MEQ tablet TAKE ONE TABLET BY MOUTH AT NOON   Respiratory Therapy Supplies (NEBULIZER/TUBING/MOUTHPIECE) KIT Use as directed   sucralfate (CARAFATE) 1 g tablet TAKE ONE TABLET BY MOUTH FOUR TIMES DAILY. MAY cut pill in half AND DISSOLVE in water prior TO drinking   valACYclovir (VALTREX) 500 MG tablet Take 1 tablet (500 mg total) by mouth 2 (two) times daily.   [DISCONTINUED] isosorbide mononitrate (IMDUR) 30 MG 24 hr tablet Take 1 tablet (30 mg total) by mouth daily.     Allergies:   Ibuprofen   Social History   Socioeconomic History   Marital status: Widowed    Spouse name: Not on file   Number of children: 1   Years of education: 53   Highest education level: 12th grade  Occupational History   Occupation: Retired  Tobacco Use   Smoking status: Former    Types: Cigarettes    Quit date: 04/24/2000    Years since quitting: 21.4    Passive exposure: Past   Smokeless tobacco: Never  Vaping Use   Vaping Use: Never used  Substance and Sexual Activity   Alcohol use: Not Currently    Comment: has previous hx of ETOH abuse, quit 2014   Drug use: No   Sexual activity: Not Currently  Other Topics Concern   Not on file  Social History Narrative   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[place]   Grew up in [place]   Has daughter locally   Social Determinants of Health   Financial Resource Strain: [place]   (05/13/2021)   Overall Financial Resource Strain (CARDIA)    Difficulty of Paying Living Expenses: Not very hard  Food Insecurity: No Food Insecurity (05/13/2021)   Hunger Vital Sign    Worried About Running Out of Food in the Last Year: Never true    [place] in the Last Year: Never true  Transportation Needs: No Transportation Needs (05/13/2021)   PRAPARE - Hydrologist (Medical): No    Lack of Transportation (Non-Medical): No  Recent Concern: Transportation Needs - Unmet Transportation Needs (03/09/2021)   PRAPARE - Hydrologist (Medical): Yes    Lack of Transportation (Non-Medical): Yes  Physical Activity: Inactive (05/13/2021)   Exercise Vital Sign    Days of Exercise per Week: 0 days    Minutes of Exercise per Session: 0 min  Stress: Stress Concern Present (05/13/2021)   [place]    Feeling of Stress : Rather much  Social Connections: Moderately Isolated (05/13/2021)   Social Connection and Isolation Panel [NHANES]    Frequency of Communication with Friends and Family: More than three times a week    Frequency of Social Gatherings with Friends and Family: More than three times a week    Attends Religious Services: 1 to 4 times per year    Active Member of [employer] or Organizations: No    Attends Archivist Meetings: Never    Marital Status: Widowed     Family History: The patient's family history includes Breast cancer in her mother; Stroke in her father. There is no history of Stomach cancer, Colon cancer, Pancreatic cancer, Esophageal cancer, Colon polyps, or Rectal cancer.  ROS:   Review of Systems  Constitution: Negative for decreased appetite, fever and weight gain.  HENT: Negative for congestion, ear discharge, hoarse voice and sore throat.   Eyes: Negative for discharge, redness, vision loss in right eye and visual halos.  Cardiovascular:  Negative for chest pain, dyspnea on exertion, leg swelling, orthopnea and palpitations.  Respiratory: Negative for cough, hemoptysis, shortness of breath and snoring.   Endocrine: Negative for heat intolerance and polyphagia.  Hematologic/Lymphatic: Negative for bleeding problem. Does not bruise/bleed easily.  Skin: Negative for flushing, nail changes, rash and suspicious lesions.  Musculoskeletal: Negative for arthritis, joint pain, muscle cramps, myalgias, neck pain and stiffness.  Gastrointestinal: Negative for abdominal pain, bowel incontinence, diarrhea and excessive appetite.  Genitourinary: Negative for decreased libido, genital sores and incomplete emptying.  Neurological: Negative for brief paralysis, focal weakness, headaches and loss of balance.  Psychiatric/Behavioral: Negative for altered mental status, depression and suicidal ideas.  Allergic/Immunologic: Negative for HIV exposure and persistent infections.    EKGs/Labs/Other Studies Reviewed:    The following studies were reviewed today:   EKG:  None today   LHC 2019 Prox RCA lesion is 20% stenosed. Post Atrio lesion is 20% stenosed. Hemodynamic findings consistent with moderate pulmonary hypertension. LV end diastolic pressure is moderately elevated.   1. No significant CAD 2. Moderate to severely elevated LV filling pressures 3. Moderate pulmonary venous hypertension.  4. Normal cardiac output.  Plan: recommend IV diuresis. I suspect elevated troponin is due to demand ischemia in setting of decompensated heart failure.    TTE 2019  Study Conclusions  - Left ventricle: The cavity size was normal. Wall thickness was   normal. Systolic function was normal. The estimated ejection fraction was in the range of 55% to 60%. Mild hypokinesis of the    mid-apicallateral and inferolateral myocardium. Doppler   parameters are consistent with abnormal left ventricular   relaxation (grade 1 diastolic dysfunction).  - Pulmonary  arteries: Systolic pressure was moderately increased.    PA peak pressure: 62 mm Hg (S).   Recent Labs: 04/21/2021: Hemoglobin 11.3; Platelets 210 08/26/2021: ALT 7; BUN 10; Creatinine, Ser 1.52; Potassium 4.5; Sodium 141; TSH 4.80  Recent Lipid Panel    Component Value Date/Time   CHOL 127 08/26/2021 0905   TRIG 135.0 08/26/2021 0905   HDL 43.10 08/26/2021 0905   CHOLHDL 3 08/26/2021 0905   VLDL 27.0 08/26/2021 0905   LDLCALC 57 08/26/2021 0905  LDLCALC 108 (H) 03/14/2020 1439    Physical Exam:    VS:  BP 130/70 (BP Location: Left Arm)   Pulse 63   Ht _0  (1.626 m)   Wt 189 lb 12.8 oz (86.1 kg)   SpO2 95%   BMI 32.58 kg/m     Wt Readings from Last 3 Encounters:  09/17/21 189 lb 12.8 oz (86.1 kg)  09/14/21 190 lb (86.2 kg)  08/26/21 188 lb (85.3 kg)     GEN: Well nourished, well developed in no acute distress HEENT: Normal NECK: No JVD; No carotid bruits LYMPHATICS: No lymphadenopathy CARDIAC: S1S2 noted,RRR, no murmurs, rubs, gallops RESPIRATORY:  Clear to auscultation without rales, wheezing or rhonchi  ABDOMEN: Soft, non-tender, non-distended, +bowel sounds, no guarding. EXTREMITIES: No edema, No cyanosis, no clubbing MUSCULOSKELETAL:  No deformity  SKIN: Warm and dry NEUROLOGIC:  Alert and oriented x 3, non-focal PSYCHIATRIC:  Normal affect, good insight  ASSESSMENT:    1. Coronary artery disease involving native coronary artery of native heart, unspecified whether angina present   2. Primary hypertension   3. Pulmonary hypertension, unspecified ([place])   4. Morbid obesity ([place])   5. Mixed hyperlipidemia    PLAN:    She is having some chest pain, it intermittent. She does not want to purse a nuclear stress test. Will increase her Imdur to 60 mg daily. Continue Asa and statin.  Blood pressure at target.   HLN- continue her statin.  Obesity- continue with lifestyle modification. .   The patient is in agreement with the above plan. The patient left  the office in stable condition.  The patient will follow up in   Medication Adjustments/Labs and Tests Ordered: Current medicines are reviewed at length with the patient today.  Concerns regarding medicines are outlined above.  No orders of the defined types were placed in this encounter.  Meds ordered this encounter  Medications   isosorbide mononitrate (IMDUR) 60 MG 24 hr tablet    Sig: Take 1 tablet (60 mg total) by mouth daily.    Dispense:  90 tablet    Refill:  3    Patient Instructions  Medication Instructions:  Your physician has recommended you make the following change in your medication:  INCREASE: Imdur 60 mg once daily  *If you need a refill on your cardiac medications before your next appointment, please call your pharmacy*   Lab Work: None If you have labs (blood work) drawn today and your tests are completely normal, you will receive your results only by: [place] (if you have MyChart) OR A paper copy in the mail If you have any lab test that is abnormal or we need to change your treatment, we will call you to review the results.   Testing/Procedures: None   Follow-Up: At [facility], you and your health needs are our priority.  As part of our continuing mission to provide you with exceptional heart care, we have created designated Provider Care Teams.  These Care Teams include your primary Cardiologist (physician) and Advanced Practice Providers (APPs -  Physician Assistants and Nurse Practitioners) who all work together to provide you with the care you need, when you need it.  We recommend signing up for the patient portal called "MyChart".  Sign up information is provided on this After Visit Summary.  MyChart is used to connect with patients for Virtual Visits (Telemedicine).  Patients are able to view lab/test results, encounter notes, upcoming appointments, etc.  Non-urgent messages can  be sent to your provider as well.   To learn more about what  you can do with MyChart, go to [email or web address].    Your next appointment:   4 month(s)  The format for your next appointment:   In Person  Provider:   Berniece Salines, DO    Other Instructions   Important Information About Sugar         Adopting a Healthy Lifestyle.  Know what a healthy weight is for you (roughly BMI <25) and aim to maintain this   Aim for 7+ servings of fruits and vegetables daily   65-80+ fluid ounces of water or unsweet tea for healthy kidneys   Limit to max 1 drink of alcohol per day; avoid smoking/tobacco   Limit animal fats in diet for cholesterol and heart health - choose grass fed whenever available   Avoid highly processed foods, and foods high in saturated/trans fats   Aim for low stress - take time to unwind and care for your mental health   Aim for 150 min of moderate intensity exercise weekly for heart health, and weights twice weekly for bone health   Aim for 7-9 hours of sleep daily   When it comes to diets, agreement about the perfect plan isnt easy to find, even among the experts. Experts at the [employer] developed an idea known as the Healthy Eating Plate. Just imagine a plate divided into logical, healthy portions.   The emphasis is on diet quality:   Load up on vegetables and fruits - one-half of your plate: Aim for color and variety, and remember that potatoes dont count.   Go for whole grains - one-quarter of your plate: Whole wheat, barley, wheat berries, quinoa, oats, brown rice, and foods made with them. If you want pasta, go with whole wheat pasta.   Protein power - one-quarter of your plate: Fish, chicken, beans, and nuts are all healthy, versatile protein sources. Limit red meat.   The diet, however, does go beyond the plate, offering a few other suggestions.   Use healthy plant oils, such as olive, canola, soy, corn, sunflower and peanut. Check the labels, and avoid partially hydrogenated  oil, which have unhealthy trans fats.   If youre thirsty, drink water. Coffee and tea are good in moderation, but skip sugary drinks and limit milk and dairy products to one or two daily servings.   The type of carbohydrate in the diet is more important than the amount. Some sources of carbohydrates, such as vegetables, fruits, whole grains, and beans-are healthier than others.   Finally, stay active  Signed, Berniece Salines, DO  09/17/2021 9:11 PM    [facility]

## 2021-09-24 ENCOUNTER — Other Ambulatory Visit: Payer: Self-pay | Admitting: Family

## 2021-09-24 ENCOUNTER — Other Ambulatory Visit: Payer: Self-pay | Admitting: Family Medicine

## 2021-09-30 ENCOUNTER — Other Ambulatory Visit: Payer: Self-pay | Admitting: Family

## 2021-09-30 ENCOUNTER — Other Ambulatory Visit: Payer: Self-pay | Admitting: Family Medicine

## 2021-09-30 NOTE — Telephone Encounter (Signed)
Requesting:valium 10 mg Contract:unknown QQV:[id number] Last Visit:09/17/21 Next Visit:12/15/21 Last Refill:09/02/21  Please Advise

## 2021-10-02 DIAGNOSIS — I10 Essential (primary) hypertension: Secondary | ICD-10-CM

## 2021-10-02 DIAGNOSIS — J449 Chronic obstructive pulmonary disease, unspecified: Secondary | ICD-10-CM | POA: Diagnosis not present

## 2021-10-05 ENCOUNTER — Telehealth: Payer: Self-pay

## 2021-10-05 NOTE — Telephone Encounter (Signed)
  Care Management   Follow Up Note   10/05/2021 Name: TULIP MEHARG MRN: [id number] DOB: [birth year]   Referred by: [name], NP Reason for referral : No chief complaint on file.   An unsuccessful telephone outreach was attempted today. The patient was referred to the case management team for assistance with care management and care coordination.   Follow Up Plan: The care management team will reach out to the patient again over the next 30 days.   Kathyrn Sheriff, RN, MSN, BSN, CCM Care Management Coordinator [facility] [phone]

## 2021-10-07 ENCOUNTER — Telehealth: Payer: Self-pay | Admitting: Pharmacist

## 2021-10-07 ENCOUNTER — Ambulatory Visit (INDEPENDENT_AMBULATORY_CARE_PROVIDER_SITE_OTHER): Payer: Medicare PPO | Admitting: Pharmacist

## 2021-10-07 DIAGNOSIS — M109 Gout, unspecified: Secondary | ICD-10-CM

## 2021-10-07 DIAGNOSIS — I10 Essential (primary) hypertension: Secondary | ICD-10-CM

## 2021-10-07 DIAGNOSIS — E039 Hypothyroidism, unspecified: Secondary | ICD-10-CM

## 2021-10-07 DIAGNOSIS — E782 Mixed hyperlipidemia: Secondary | ICD-10-CM

## 2021-10-07 DIAGNOSIS — F4321 Adjustment disorder with depressed mood: Secondary | ICD-10-CM

## 2021-10-07 NOTE — Chronic Care Management (AMB) (Signed)
Chronic Care Management Pharmacy Note  10/07/2021 Name:  Veronica Wolf MRN:  [id number] DOB:  05-04-1940  Summary: Patient continues to receive medications from her pharmacy in adherence packaging to help her to take the right medications daily and at the right time. She has filled maintenance medications. She has several medication bottles that she was not sure if she was suppose to be taking. She read out names and we discussed if they were in her packaging or not. Atorvastatin, allopurinol, lisinopril and levothyroxine - instructed her were in packaging and these bottles were only extra from January when [facility] filled 90 day supply. Patient voiced understanding that she knew these were in her packaging.  Coordinated with pharmacy for next delivery - patient will receive delivery of adherence packaging today.  We also reviewed medications that she takes as needed which are not in her packaging:   - meclizine 25mg  - take 1 tablet twice a day as needed for dizziness - nitroglycerine 0.4mg  - place 1 tablet under the tongue and allow to dissolve if needed for chest pain. May repeat in 5 minutes. If still no relief of chest pain, call 911.   - diazepam 10mg  - take 1 tablet if needed for anxiety up to every 12 hours.   - dicyclomine 10mg  - take 1 capsules 4 times a day - morning, lunch, dinner and bedtime for abdominal pain.   Phone follow up planned for 1 month.  Subjective: Veronica Wolf is an 81 y.o. year old female who is a primary patient of Debbrah Alar, NP.  The CCM team was consulted for assistance with disease management and care coordination needs.    Engaged with patient face to face for follow up visit in response to provider referral for pharmacy case management and/or care coordination services.   Consent to Services:  The patient was given information about Chronic Care Management services, agreed to services, and gave verbal consent prior to initiation of services.   Please see initial visit note for detailed documentation.   Patient Care Team: Debbrah Alar, NP as PCP - General (Internal Medicine) Berniece Salines, DO as PCP - Cardiology (Cardiology) Cherre Robins, RPH-CPP (Pharmacist) Luretha Rued, RN as Case Manager  Recent office visits: 09/14/2021 - Int Med Inda Castle, NP) Acute visit for acute vaginitis. Labs checked - HSV +. Prescribed valacyclovir 500mg  twice a day for 7 days.  08/26/2021 - Int Med Inda Castle, NP) Acute visit for atypical chest pain. Labs checked. Referred to cardiologist. Also concerned about memory issues - referred to neurologist. Discussed with patient's daughter about memory issues and daughter agreed to try to help patient out.  04/21/2021 - Internal med Inda Castle) Phone visit for cough, shortness of breath and wheezing. Patient encouraged to go to ED.   Recent consult visits: 09/17/2021 - Cardio (Dr Harriet Masson) F/U CAD. Patient reports chest pain. Unable to get Stress test done. Increased isosorbide to 60mg  daily. Continue ASA and statin    Hospital visits: 04/21/2021 ED Visit at [facility]. URI leading to COPD exacerbation. Prescribed doxycycline, Augmentin, prednisone and benzonatate at discharge.    Objective:  Lab Results  Component Value Date   CREATININE 1.52 (H) 08/26/2021   CREATININE 1.63 (H) 04/21/2021   CREATININE 1.40 (H) 01/09/2021    Lab Results  Component Value Date   HGBA1C 6.5 01/09/2021   Last diabetic Eye exam: No results found for: "HMDIABEYEEXA"  Last diabetic Foot exam: No results found for: "HMDIABFOOTEX"  Component Value Date/Time   CHOL 127 08/26/2021 0905   TRIG 135.0 08/26/2021 0905   HDL 43.10 08/26/2021 0905   CHOLHDL 3 08/26/2021 0905   VLDL 27.0 08/26/2021 0905   LDLCALC 57 08/26/2021 0905   LDLCALC 108 (H) 03/14/2020 1439       Latest Ref Rng & Units 08/26/2021    9:05 AM 04/21/2021    6:05 PM 01/09/2021    8:10 AM  Hepatic Function  Total Protein  6.0 - 8.3 g/dL 6.7  6.6  6.7   Albumin 3.5 - 5.2 g/dL 3.9  3.1  3.8   AST 0 - 37 U/L $Remo'13  14  14   'VcPOh$ ALT 0 - 35 U/L $Remo'7  10  6   'AEJJx$ Alk Phosphatase 39 - 117 U/L 76  70  82   Total Bilirubin 0.2 - 1.2 mg/dL 0.4  0.5  0.3   Bilirubin, Direct 0.0 - 0.2 mg/dL  0.1      Lab Results  Component Value Date/Time   TSH 4.80 08/26/2021 09:05 AM   TSH 3.57 08/18/2020 12:03 PM   FREET4 0.85 01/24/2018 03:04 PM       Latest Ref Rng & Units 04/21/2021    3:16 PM 09/08/2020   11:03 AM 06/11/2020    2:00 PM  CBC  WBC 4.0 - 10.5 K/uL 16.2  7.3  9.1   Hemoglobin 12.0 - 15.0 g/dL 11.3  11.6  11.5   Hematocrit 36.0 - 46.0 % 36.1  36.1  38.1   Platelets 150 - 400 K/uL 210  219.0  219     No results found for: "VD25OH"  Clinical ASCVD: Yes  The ASCVD Risk score (Arnett DK, et al., 2019) failed to calculate for the following reasons:   The 2019 ASCVD risk score is only valid for ages 15 to 70   The patient has a prior MI or stroke diagnosis    Other:  DEXA 03/05/2014             AP LUMBAR SPINE not used for calculation due to significant degenerative changes.             Left FOREARM (1/3 RADIUS) T Score: -0.4             Left FEMUR neck T-Score: -0.7     Social History   Tobacco Use  Smoking Status Former   Types: Cigarettes   Quit date: 04/24/2000   Years since quitting: 21.4   Passive exposure: Past  Smokeless Tobacco Never   BP Readings from Last 3 Encounters:  09/17/21 130/70  09/14/21 (!) 110/57  08/26/21 125/75   Pulse Readings from Last 3 Encounters:  09/17/21 63  09/14/21 74  08/26/21 86   Wt Readings from Last 3 Encounters:  09/17/21 189 lb 12.8 oz (86.1 kg)  09/14/21 190 lb (86.2 kg)  08/26/21 188 lb (85.3 kg)    Assessment: Review of patient past medical history, allergies, medications, health status, including review of consultants reports, laboratory and other test data, was performed as part of comprehensive evaluation and provision of chronic care management services.    SDOH:  (Social Determinants of Health) assessments and interventions performed:  SDOH Interventions    Flowsheet Row Most Recent Value  SDOH Interventions   Financial Strain Interventions Intervention Not Indicated  Housing Interventions Intervention Not Indicated         CCM Care Plan  Allergies  Allergen Reactions   Ibuprofen Other (See Comments)  Pt has hx of peptic ulcer and diverticulitis.     Medications Reviewed Today     Reviewed by Cherre Robins, RPH-CPP (Pharmacist) on 10/07/21 at 1126  Med List Status: <None>   Medication Order Taking? Sig Documenting Provider Last Dose Status Informant  albuterol (PROVENTIL) (2.5 MG/3ML) 0.083% nebulizer solution [id number] Yes USE 1 VIAL IN NEBULIZER EVERY 6 HOURS AS NEEDED FOR WHEEZING AND FOR SHORTNESS OF Thayer Headings, NP Taking Active   albuterol (VENTOLIN HFA) 108 (90 Base) MCG/ACT inhaler [id number] Yes Inhale 2 puffs into the lungs every 6 (six) hours as needed for wheezing or shortness of breath. Debbrah Alar, NP Taking Active   allopurinol (ZYLOPRIM) 100 MG tablet [id number] Yes TAKE TWO TABLETS BY MOUTH ONCE DAILY Debbrah Alar, NP Taking Active   aspirin 81 MG EC tablet [id number] Yes Take 1 tablet (81 mg total) by mouth daily. Swallow whole. Berniece Salines, DO Taking Active            Med Note Antony Contras, Kitara Hebb B   Wed Oct 07, 2021 11:19 AM)    atorvastatin (LIPITOR) 80 MG tablet [id number] Yes Take 1 tablet (80 mg total) by mouth daily. Debbrah Alar, NP Taking Active   colchicine 0.6 MG tablet [id number] No Take 0.6 mg by mouth daily as needed (gout).  Patient not taking: Reported on 10/07/2021   [provider] Not Taking Active   diazepam (VALIUM) 10 MG tablet [id number] Yes TAKE ONE TABLET BY MOUTH EVERY TWELVE HOURS AS NEEDED FOR ANXIETY Debbrah Alar, NP Taking Active   dicyclomine (BENTYL) 10 MG capsule [id number] Yes Take 1 capsule (10 mg total) by mouth in the morning, at  noon, in the evening, and at bedtime. Debbrah Alar, NP Taking Active   escitalopram (LEXAPRO) 20 MG tablet [id number] Yes TAKE ONE TABLET BY MOUTH EVERY MORNING Debbrah Alar, NP Taking Active   ezetimibe (ZETIA) 10 MG tablet [id number] Yes Take 1 tablet (10 mg total) by mouth daily. Debbrah Alar, NP Taking Active   famotidine (PEPCID) 20 MG tablet [id number] Yes TAKE ONE TABLET BY MOUTH EVERY MORNING and TAKE ONE TABLET BY MOUTH EVERYDAY AT BEDTIME Debbrah Alar, NP Taking Active   isosorbide mononitrate (IMDUR) 60 MG 24 hr tablet [id number] Yes Take 1 tablet (60 mg total) by mouth daily. Berniece Salines, DO Taking Active   levothyroxine (SYNTHROID) 25 MCG tablet [id number] Yes TAKE ONE TABLET BY MOUTH EVERY MORNING Debbrah Alar, NP Taking Active   lisinopril (ZESTRIL) 2.5 MG tablet [id number] Yes Take 1 tablet (2.5 mg total) by mouth daily. Debbrah Alar, NP Taking Active   meclizine (ANTIVERT) 25 MG tablet [id number] Yes TAKE ONE TABLET BY MOUTH twice daily AS NEEDED FOR dizziness Debbrah Alar, NP Taking Active   metoprolol succinate (TOPROL-XL) 25 MG 24 hr tablet [id number] Yes Take 0.5 tablets (12.5 mg total) by mouth daily. Debbrah Alar, NP Taking Active   nitroGLYCERIN (NITROSTAT) 0.4 MG SL tablet [id number] Yes Place 1 tablet (0.4 mg total) under the tongue every 5 (five) minutes as needed for chest pain. Berniece Salines, DO Taking Active   nystatin (MYCOSTATIN/NYSTOP) powder [id number] Yes APPLY POWDER TOPICALLY TO AFFECTED AREA 2 TIMES DAILY Debbrah Alar, NP Taking Active   pantoprazole (PROTONIX) 40 MG tablet [id number] Yes TAKE ONE TABLET BY MOUTH TWICE DAILY Debbrah Alar, NP Taking Active   potassium chloride SA (KLOR-CON M) 20 MEQ tablet [id number] Yes TAKE ONE TABLET BY MOUTH AT Talbert Nan, NP Taking Active   Respiratory Therapy Supplies (NEBULIZER/TUBING/MOUTHPIECE) KIT  [id number] Yes Use as directed Debbrah Alar, NP  Taking Active   sucralfate (CARAFATE) 1 g tablet [id number] No TAKE ONE TABLET BY MOUTH FOUR TIMES DAILY. MAY cut pill in half AND DISSOLVE in water prior TO drinking  Patient not taking: Reported on 10/07/2021   Debbrah Alar, NP Not Taking Active   valACYclovir (VALTREX) 500 MG tablet [id number] No Take 1 tablet (500 mg total) by mouth 2 (two) times daily.  Patient not taking: Reported on 10/07/2021   Debbrah Alar, NP Not Taking Active             Patient Active Problem List   Diagnosis Date Noted   Pulmonary hypertension, unspecified ([place]) 09/17/2021   Morbid obesity ([place]) 09/17/2021   Acute vaginitis 09/14/2021   Atypical chest pain 08/26/2021   Memory loss 08/26/2021   Cough 04/21/2021   Back pain 12/03/2020   Acute cystitis without hematuria 12/03/2020   Grief reaction 12/03/2020   Fatty liver 12/01/2020   Intermittent left-sided chest pain 09/17/2020   Coronary artery disease involving native coronary artery of native heart 04/13/3233   Metabolic syndrome 57/32/2025   Prediabetes 09/17/2020   Allergy 09/16/2020   Anxiety 09/16/2020   Hypertension 09/16/2020   Vertigo 09/16/2020   Dark stools 09/08/2020   Dysphagia    Esophageal stricture    Hiatal hernia    Epigastric pain    Hypothyroid 01/25/2018   Mild CAD 10/03/2017   Stress-induced cardiomyopathy 09/19/2017   COPD (chronic obstructive pulmonary disease) ([place]) 09/14/2017   Hypokalemia 09/14/2017   Non-ST elevation (NSTEMI) myocardial infarction [facility]) - due to Demand Infarction 09/14/2017   CKD (chronic kidney disease) 04/04/2017   HLD (hyperlipidemia)    Allergic rhinitis    Gout 02/28/2017   Depression with anxiety 02/28/2017   Epigastric abdominal pain 12/22/2016   COPD with chronic bronchitis ([place]) 12/22/2016   GERD (gastroesophageal reflux disease) 12/22/2016   DOE (dyspnea on exertion) 12/22/2016   Peptic ulcer 01/04/2003    Immunization History  Administered Date(s) Administered    Fluad Quad(high Dose 65+) 03/14/2020, 12/03/2020   Influenza, High Dose Seasonal PF 01/23/2018, 12/07/2018   Influenza-Unspecified 12/23/2018   PFIZER(Purple Top)SARS-COV-2 Vaccination 05/14/2019, 06/08/2019   Pneumococcal Conjugate-13 07/16/2015   Pneumococcal Polysaccharide-23 12/23/2016   Tdap 09/02/2009, 04/05/2017    Conditions to be addressed/monitored: CAD, HTN, HLD, COPD, Anxiety, Depression and gout, GERD ; hypothyroidism  Care Plan : General Pharmacy (Adult)  Updates made by Cherre Robins, RPH-CPP since 10/07/2021 12:00 AM     Problem: Medication management and Monitoring and Coordination of Care for Chronic Conditions   Priority: High  Onset Date: 09/02/2020     Long-Range Goal: Medication management   Start Date: 09/02/2020  This Visit's Progress: On track  Recent Progress: On track  Priority: High  Note:    Current Barriers:  Unable to achieve control of hyperlipidemia  Does not adhere to prescribed medication regimen Does not maintain contact with provider office Sometimes forgets to take night time dose of medications - improving  Pharmacist Clinical Goal(s):  Over the next 90 days, patient will achieve adherence to monitoring guidelines and medication adherence to achieve therapeutic efficacy achieve control of hyperlipidemia as evidenced by LDL < 70 adhere to prescribed medication regimen as evidenced by filling prescriptions on times contact provider office for questions/concerns as evidenced notation of same in electronic health record through collaboration with PharmD and provider.   Interventions: 1:1 collaboration with Debbrah Alar, NP regarding development and update of comprehensive plan of  care as evidenced by provider attestation and co-signature Inter-disciplinary care team collaboration (see longitudinal plan of care) Comprehensive medication review performed; medication list updated in electronic medical record  Hypertension: Controlled  though blood pressure was a little low at last office visit BP goal is:  <130/80  Patient is not checking BP at home - states she does not have BP cuff.  Patient has failed these meds in the past: None noted  Current Regimen: Metoprolol Succinate 25mg  0.5 tab daily Lisinopril 2.5mg  daily Interventions:  Reviewed signs and symptoms of low blood pressure to monitor for.  Discussed blood pressure  goal Continue current medications for blood pressure   Hyperlipidemia / history of MI: Controlled; LDL goal < 70  Patient has failed these meds in past: None noted   In September 2022 patient reported she just stopped taking atorvastatin because she was taking so many other medications. She has been taking atrovastatin regularly since. LDL in May 2023 was 42 (previously was 125 in May of 2022)  Current therapy Atorvastatin 80mg  daily (increased 08/18/2020) Ezetimibe 10mg  daily (added 09/17/2020)  Metoprolol Succinate 25mg  1/2 tab daily Isosorbide ER 60mg  daily  Aspirin 81mg  daily  Nitroglycerin 0.4mg  - place 1 tablet under tongue as needed for chest pain, may repeat in 5 minutes if chest pain not relieved. Interventions: (addressed at previous visit) Discussed LDL goal Discussed importance of taking atorvastatin daily to both lower cholesterol and prevent ASCVD / recurrent MI; Adherence has improved since she started adherence packaging. Reminded patient to reschedule appointment with cardiologist.   Chronic Obstructive Pulmonary Disease: Controlled; last exacerbation due to upper respiratory infection 04/21/2021 Current treatment: Albuterol solution for nebulizer - use in nebulizer up to every 6 hours as needed for shortness of breath Interventions:  Monitor exacerbations; Determine if escalation in therapy is needed.  Depression/Anxiety:  Improved; goal: decrease use of diazepam and decrease number of days per month with symptoms of grief / depression Current treatment: Escitalopram  20mg  daily Diazepam 10mg  every 12 hours as needed  Patient has failed these meds in past: citalopram (ineffective) Patient has been taking diazepam less recently. Only using 1 or 2 times per week when she has difficulty sleeping.  Reports her mood has been stable the last 2 months. She still grieves the loss of her brother September 10, 2020 but states grief has improved over the last 1 to 2  months. Her brother was like a father to her.  Interventions:  Discussed adherence and importance of taking escitalopram for depression / anxiety Encouraged patient to continue to use diazepam sparingly - only at night if needed.  Consider grief counseling (patient continues to decline). Patient will continue consults with Care Coordination LCSW  Hypothyroidism:  Patient is currently controlled on the following medications:  levothyroxine 74mcg daily  TSH was WNL at last check  Patient has failed these meds in past: None noted  Intervention:  Continue current therapy   Gout:   Goal Uric Acid < 6 mg/dL   Last uric acid was not at goal (UA = 8.4 on 03/14/2020) but patient denies symptoms of gout at this time.  Current medications:  Allopurinol 100mg  - take 2 tablets = 200mg  daily Colchicine 0.6mg  up to bid as needed for acute gout Medications that may increase uric acid levels: Aspirin Last gout flare: Feb 2022 Patient has failed these meds in past: None noted   Interventions:  Reviewed preventative versus treatment medications for gout.  Continue current therapy Consider checking Uric acid  with next labs.    Medication management:   She started medication adherence packaging 01/08/2021. Current pharmacy: [facility] Intervention:  Comprehensive medication review completed.    Coordinated with [facility] regarding upcoming adherence packaging deliver.  Education provided about how to order over-the-counter medication from [facility].  Discussed tips to help her remember to take nighttime medications more  consistently - set alarm on cell phone, place medications be bedside or with toothbrush.    Patient Goals/Self-Care Activities Over the next 90 days, patient will:  take medications as prescribed focus on medication adherence by filing prescriptions at one pharmacy and using adherence packaging Consider ordering blood pressure cuff with [employer] over-the-counter benefits.   Discussed tips to help her remember to take nighttime medications more consistently - set alarm on cell phone, place medications be bedside or with toothbrush.  These medications are NOT included in your packaging: '  - meclizine 25mg  - take 1 tablet twice a day as needed for dizziness - nitroglycerine 0.4mg  - place 1 tablet under the tongue and allow to dissolve if needed for chest pain. May repeat in 5 minutes. If still no relief of chest pain, call 911.   - diazepam 10mg  - take 1 tablet if needed for anxiety up to every 12 hours.   - dicyclomine 10mg  - take 1 capsules 4 times a day - morning, lunch, dinner and bedtime for abdominal pain.    Follow Up Plan: Telephone follow up appointment with care management team member scheduled for:  1 month          Medication Assistance: None required.  Patient affirms current coverage meets needs.  Patient's preferred pharmacy is:  [facility], [place] [address] Phone: [phone] Fax: [phone]  [facility] - [place], [place] - [address] [place] [address] Phone: [phone] Fax: [phone]   Follow Up:  Patient agrees to Care Plan and Follow-up.  Plan: phone follow up in 1 month.   Cherre Robins, PharmD Clinical Pharmacist [facility] [phone]

## 2021-10-07 NOTE — Patient Instructions (Signed)
Ms Raabe,  It was a pleasure speaking with you today Below is a summary of your health goals. You can also find an updated Chronic Care Management Care Plan at the end of this After Visit Summary.   Patient Goals/Self-Care Activities Over the next 90 days, patient will:  take medications as prescribed focus on medication adherence by filing prescriptions at one pharmacy and using adherence packaging Consider ordering blood pressure cuff with [employer] over-the-counter benefits.   Discussed tips to help her remember to take nighttime medications more consistently - set alarm on cell phone, place medications be bedside or with toothbrush.  These medications are NOT included in your packaging: '  - meclizine 25mg  - take 1 tablet twice a day as needed for dizziness - nitroglycerine 0.4mg  - place 1 tablet under the tongue and allow to dissolve if needed for chest pain. May repeat in 5 minutes. If still no relief of chest pain, call 911.   - diazepam 10mg  - take 1 tablet if needed for anxiety up to every 12 hours.   - dicyclomine 10mg  - take 1 capsules 4 times a day - morning, lunch, dinner and bedtime for abdominal pain.    If you have any questions or concerns, please feel free to contact me either at the phone number below or with a MyChart message.   Keep up the good work!  , PharmD Clinical Pharmacist [facility] [phone] (direct line)  [phone] (main office number)   The patient verbalized understanding of instructions, educational materials, and care plan provided today and agreed to receive a mailed copy of patient instructions, educational materials, and care plan.

## 2021-10-07 NOTE — Telephone Encounter (Signed)
Called patient to scheduled Chronic Care Management follow up - scheduled for today - see notes.

## 2021-10-09 ENCOUNTER — Telehealth: Payer: Self-pay

## 2021-10-09 NOTE — Telephone Encounter (Signed)
  Care Management   Follow Up Note   10/09/2021 Name: Veronica Wolf MRN: [id number] DOB: [birth year]   Referred by: [name], NP Reason for referral : Chronic Care Management   A second unsuccessful telephone outreach was attempted today. The patient was referred to the case management team for assistance with care management and care coordination.   Follow Up Plan: The care management team will reach out to the patient again over the next 30 days.   Kathyrn Sheriff, RN, MSN, BSN, CCM Care Management Coordinator [facility] [phone]

## 2021-10-11 ENCOUNTER — Other Ambulatory Visit: Payer: Self-pay | Admitting: Family

## 2021-10-11 DIAGNOSIS — R42 Dizziness and giddiness: Secondary | ICD-10-CM

## 2021-10-14 ENCOUNTER — Telehealth: Payer: Self-pay | Admitting: Family

## 2021-10-14 ENCOUNTER — Ambulatory Visit: Payer: Self-pay

## 2021-10-14 DIAGNOSIS — I10 Essential (primary) hypertension: Secondary | ICD-10-CM

## 2021-10-14 DIAGNOSIS — J449 Chronic obstructive pulmonary disease, unspecified: Secondary | ICD-10-CM

## 2021-10-14 NOTE — Telephone Encounter (Signed)
Patient states she's had diarrhea for a whole week with no other symptoms and would like for Melissa to send her something to stop it. Please advise.

## 2021-10-14 NOTE — Patient Instructions (Addendum)
Visit Information  Thank you for taking time to visit with me today. Please don't hesitate to contact me if I can be of assistance to you before our next scheduled telephone appointment.  Following are the goals we discussed today:  Patient Goals/Self-Care Activities: Take medications as prescribed   Attend all scheduled provider appointments Keep track of how you are feeling and any symptoms. Contact your primary care provider for any health concerns or questions Keep a notepad near you to write down your reminders Contact: [facility]: [phone] and [facility] [employer] [phone] for Senior activities programs.  Call your nurse care manager for care management and/or care coordination needs as needed  Our next appointment is by telephone on 10/27/21 at 11:00 am  Please call the care guide team at [phone] if you need to cancel or reschedule your appointment.   If you are experiencing a Mental Health or Behavioral Health Crisis or need someone to talk to, please call the Suicide and Crisis Lifeline: 988   The patient verbalized understanding of instructions, educational materials, and care plan provided today and agreed to receive a mailed copy of patient instructions, educational materials, and care plan.   Kathyrn Sheriff, RN, MSN, BSN, CCM Care Management Coordinator [facility] [phone]

## 2021-10-14 NOTE — Telephone Encounter (Signed)
Patient advised per pcp to come in and pick up a stool kit to check for echoli and to take otc imodium.  She verbalized understanding and will send her grandson to pick up kit.

## 2021-10-14 NOTE — Chronic Care Management (AMB) (Signed)
Chronic Care Management   CCM RN Visit Note  10/14/2021 Name: Veronica Wolf MRN: [id number] DOB: Jan 02, 1941  Subjective: Veronica Wolf is a 81 y.o. year old female who is a primary care patient of Debbrah Alar, NP. The care management team was consulted for assistance with disease management and care coordination needs.    Engaged with patient by telephone for follow up visit in response to provider referral for case management and/or care coordination services.   Consent to Services:  The patient was given information about Chronic Care Management services, agreed to services, and gave verbal consent prior to initiation of services.  Please see initial visit note for detailed documentation.   Patient agreed to services and verbal consent obtained.   Assessment: Review of patient past medical history, allergies, medications, health status, including review of consultants reports, laboratory and other test data, was performed as part of comprehensive evaluation and provision of chronic care management services.   SDOH (Social Determinants of Health) assessments and interventions performed:    CCM Care Plan  Allergies  Allergen Reactions   Ibuprofen Other (See Comments)    Pt has hx of peptic ulcer and diverticulitis.     Outpatient Encounter Medications as of 10/14/2021  Medication Sig   albuterol (PROVENTIL) (2.5 MG/3ML) 0.083% nebulizer solution USE 1 VIAL IN NEBULIZER EVERY 6 HOURS AS NEEDED FOR WHEEZING AND FOR SHORTNESS OF BREATH   albuterol (VENTOLIN HFA) 108 (90 Base) MCG/ACT inhaler Inhale 2 puffs into the lungs every 6 (six) hours as needed for wheezing or shortness of breath.   allopurinol (ZYLOPRIM) 100 MG tablet TAKE TWO TABLETS BY MOUTH ONCE DAILY   aspirin 81 MG EC tablet Take 1 tablet (81 mg total) by mouth daily. Swallow whole.   atorvastatin (LIPITOR) 80 MG tablet Take 1 tablet (80 mg total) by mouth daily.   colchicine 0.6 MG tablet Take 0.6 mg by mouth daily  as needed (gout). (Patient not taking: Reported on 10/07/2021)   diazepam (VALIUM) 10 MG tablet TAKE ONE TABLET BY MOUTH EVERY TWELVE HOURS AS NEEDED FOR ANXIETY   dicyclomine (BENTYL) 10 MG capsule Take 1 capsule (10 mg total) by mouth in the morning, at noon, in the evening, and at bedtime.   escitalopram (LEXAPRO) 20 MG tablet TAKE ONE TABLET BY MOUTH EVERY MORNING   ezetimibe (ZETIA) 10 MG tablet Take 1 tablet (10 mg total) by mouth daily.   famotidine (PEPCID) 20 MG tablet TAKE ONE TABLET BY MOUTH EVERY MORNING and TAKE ONE TABLET BY MOUTH EVERYDAY AT BEDTIME   isosorbide mononitrate (IMDUR) 60 MG 24 hr tablet Take 1 tablet (60 mg total) by mouth daily.   levothyroxine (SYNTHROID) 25 MCG tablet TAKE ONE TABLET BY MOUTH EVERY MORNING   lisinopril (ZESTRIL) 2.5 MG tablet Take 1 tablet (2.5 mg total) by mouth daily.   meclizine (ANTIVERT) 25 MG tablet TAKE ONE TABLET BY MOUTH twice daily AS NEEDED FOR dizziness   metoprolol succinate (TOPROL-XL) 25 MG 24 hr tablet Take 0.5 tablets (12.5 mg total) by mouth daily.   nitroGLYCERIN (NITROSTAT) 0.4 MG SL tablet Place 1 tablet (0.4 mg total) under the tongue every 5 (five) minutes as needed for chest pain.   nystatin (MYCOSTATIN/NYSTOP) powder APPLY POWDER TOPICALLY TO AFFECTED AREA 2 TIMES DAILY   pantoprazole (PROTONIX) 40 MG tablet TAKE ONE TABLET BY MOUTH TWICE DAILY   potassium chloride SA (KLOR-CON M) 20 MEQ tablet TAKE ONE TABLET BY MOUTH AT NOON   Respiratory Therapy Supplies (  NEBULIZER/TUBING/MOUTHPIECE) KIT Use as directed   sucralfate (CARAFATE) 1 g tablet TAKE ONE TABLET BY MOUTH FOUR TIMES DAILY. MAY cut pill in half AND DISSOLVE in water prior TO drinking (Patient not taking: Reported on 10/07/2021)   valACYclovir (VALTREX) 500 MG tablet Take 1 tablet (500 mg total) by mouth 2 (two) times daily. (Patient not taking: Reported on 10/07/2021)   No facility-administered encounter medications on file as of 10/14/2021.    Patient Active Problem  List   Diagnosis Date Noted   Pulmonary hypertension, unspecified ([place]) 09/17/2021   Morbid obesity ([place]) 09/17/2021   Acute vaginitis 09/14/2021   Atypical chest pain 08/26/2021   Memory loss 08/26/2021   Cough 04/21/2021   Back pain 12/03/2020   Acute cystitis without hematuria 12/03/2020   Grief reaction 12/03/2020   Fatty liver 12/01/2020   Intermittent left-sided chest pain 09/17/2020   Coronary artery disease involving native coronary artery of native heart 04/59/9774   Metabolic syndrome 14/23/9532   Prediabetes 09/17/2020   Allergy 09/16/2020   Anxiety 09/16/2020   Hypertension 09/16/2020   Vertigo 09/16/2020   Dark stools 09/08/2020   Dysphagia    Esophageal stricture    Hiatal hernia    Epigastric pain    Hypothyroid 01/25/2018   Mild CAD 10/03/2017   Stress-induced cardiomyopathy 09/19/2017   COPD (chronic obstructive pulmonary disease) ([place]) 09/14/2017   Hypokalemia 09/14/2017   Non-ST elevation (NSTEMI) myocardial infarction [facility]) - due to Demand Infarction 09/14/2017   CKD (chronic kidney disease) 04/04/2017   HLD (hyperlipidemia)    Allergic rhinitis    Gout 02/28/2017   Depression with anxiety 02/28/2017   Epigastric abdominal pain 12/22/2016   COPD with chronic bronchitis ([place]) 12/22/2016   GERD (gastroesophageal reflux disease) 12/22/2016   DOE (dyspnea on exertion) 12/22/2016   Peptic ulcer 01/04/2003    Conditions to be addressed/monitored:HTN and COPD  Care Plan : RN Care Manager Plan of Care  Updates made by Luretha Rued, RN since 10/14/2021 12:00 AM     Problem: Chronic disease Managment education and/or care coordination needs   Priority: High     Long-Range Goal: Development of Plan of Care for Chronic Disease Managment and/or care coordination needs   Start Date: 05/04/2021  Expected End Date: 12/16/2021  Priority: High  Note:   Current Barriers:  Care Coordination needs related to Limited social support and Lacks knowledge of  community resource: grieving loss of her brother  Chronic Disease Management support and education needs related to HTN and COPD Medication management/adherence-clinical pharmacist involved Does not have blood pressure monitor-She states she knows where she can get a blood pressure monitor and has made a note to self to obtain. Memory-patient reports she is being referred to a neurologist regarding memory loss.  10/14/21 Patient reports main problem today is diarrhea. She reports has been "off and on for the past week". She reports she is eating, but not eating a lot and she reports the diarrheal is "not a lot". She is not sure what may be causing the diarrhea. She denies any issues with  her breathing at this time and reports she continues to take medications as prescribed. RNCM instructed patient to contact primary care provider for further direction. Patient verbalized understanding.  RNCM Clinical Goal(s):  Patient will verbalize understanding of plan for management of HTN and COPD as evidenced by self report and/or chart notatation demonstrate improved adherence to prescribed treatment plan for HTN and COPD as evidenced by self report and/or chart  notation continue to work with Consulting civil engineer and/or Social Worker to address care management and care coordination needs related to HTN and COPD as evidenced by adherence to CM Team Scheduled appointments     work with [employer] care guide to address needs related to Limited social support, Transport planner, and [employer] knowledge of community resource: report knowledge of Secondary school teacher as evidenced by patient and/or community resource care guide support    through collaboration with Consulting civil engineer, provider, and care team.   Interventions: 1:1 collaboration with primary care provider regarding development and update of comprehensive plan of care as evidenced by provider attestation and co-signature Inter-disciplinary care team  collaboration (see longitudinal plan of care) Evaluation of current treatment plan related to  self management and patient's adherence to plan as established by provider LCSW referral regarding recent loss of her brother, loss of independence, decrease in social activities-continues to work with [employer] Patient in good spirits today. Reviewed previous resources to increase socialization. Update primary care provider  COPD Interventions:  (Status:  Goal on track:  Yes.) Long Term Goal Patient encouraged to continue to take medications as prescribed Encouraged to attend provider visits as scheduled  Hypertension Interventions:  (Status:  Goal on track:  Yes.) Long Term Goal BP Readings from Last 3 Encounters:  09/17/21 130/70  09/14/21 (!) 110/57  08/26/21 125/75  Most recent eGFR/CrCl: No results found for: "EGFR"  No components found for: "CRCL"  Clinical pharmacist remains involved for medication adherence/management. Patient encouraged to continue to take as prescribed  Discussed obtaining blood pressure monitor  Patient Goals/Self-Care Activities: Take medications as prescribed   Attend all scheduled provider appointments Keep track of how you are feeling and any symptoms. Contact your primary care provider for any health concerns or questions Keep a notepad near you to write down your reminders Contact: Marvia Pickles. [facility]: [phone] and [employer] [phone] for Senior activities programs.  Call your nurse care manager for care management and/or care coordination needs as needed   Plan:The care management team will reach out to the patient again over the next 30 days.  Thea Silversmith, RN, MSN, BSN, CCM Care Management Coordinator [facility] [phone]

## 2021-10-15 ENCOUNTER — Other Ambulatory Visit: Payer: Medicare PPO

## 2021-10-15 DIAGNOSIS — R197 Diarrhea, unspecified: Secondary | ICD-10-CM

## 2021-10-19 ENCOUNTER — Telehealth: Payer: Self-pay | Admitting: Family

## 2021-10-19 DIAGNOSIS — R197 Diarrhea, unspecified: Secondary | ICD-10-CM

## 2021-10-19 LAB — GI PROFILE, STOOL, PCR

## 2021-10-19 NOTE — Telephone Encounter (Signed)
See order(s).

## 2021-10-26 ENCOUNTER — Other Ambulatory Visit: Payer: Self-pay | Admitting: Family

## 2021-10-27 ENCOUNTER — Ambulatory Visit: Payer: Medicare PPO

## 2021-10-27 NOTE — Patient Instructions (Signed)
Visit Information  Thank you for allowing me to share the care management and care coordination services that are available to you as part of your health plan and services through your primary care provider and medical home. Please reach out to your primary care provider or me at [phone] if the care management/care coordination team may be of assistance to you in the future.   Varetta Chavers, RN, MSN, BSN, CCM Care Management Coordinator [facility] [phone]  

## 2021-10-27 NOTE — Chronic Care Management (AMB) (Signed)
Care Management    RN Visit Note  10/27/2021 Name: Veronica Wolf MRN: [id number] DOB: 1940-07-26  Subjective: Veronica Wolf is a 81 y.o. year old female who is a primary care patient of Debbrah Alar, NP. The care management team was consulted for assistance with disease management and care coordination needs.    Engaged with patient by telephone for follow up visit in response to provider referral for case management and/or care coordination services.   Consent to Services:   Veronica Wolf was given information about Care Management services today including:  Care Management services includes personalized support from designated clinical staff supervised by her physician, including individualized plan of care and coordination with other care providers 24/7 contact phone numbers for assistance for urgent and routine care needs. The patient may stop case management services at any time by phone call to the office staff.  Patient agreed to services and consent obtained.   Assessment: Review of patient past medical history, allergies, medications, health status, including review of consultants reports, laboratory and other test data, was performed as part of comprehensive evaluation and provision of chronic care management services.   SDOH (Social Determinants of Health) assessments and interventions performed:    Care Plan  Allergies  Allergen Reactions   Ibuprofen Other (See Comments)    Pt has hx of peptic ulcer and diverticulitis.     Outpatient Encounter Medications as of 10/27/2021  Medication Sig   albuterol (PROVENTIL) (2.5 MG/3ML) 0.083% nebulizer solution USE 1 VIAL IN NEBULIZER EVERY 6 HOURS AS NEEDED FOR WHEEZING AND FOR SHORTNESS OF BREATH   albuterol (VENTOLIN HFA) 108 (90 Base) MCG/ACT inhaler Inhale 2 puffs into the lungs every 6 (six) hours as needed for wheezing or shortness of breath.   allopurinol (ZYLOPRIM) 100 MG tablet TAKE TWO TABLETS BY MOUTH ONCE DAILY    aspirin 81 MG EC tablet Take 1 tablet (81 mg total) by mouth daily. Swallow whole.   atorvastatin (LIPITOR) 80 MG tablet Take 1 tablet (80 mg total) by mouth daily.   colchicine 0.6 MG tablet Take 0.6 mg by mouth daily as needed (gout). (Patient not taking: Reported on 10/07/2021)   diazepam (VALIUM) 10 MG tablet TAKE ONE TABLET BY MOUTH EVERY TWELVE HOURS AS NEEDED FOR ANXIETY   dicyclomine (BENTYL) 10 MG capsule Take 1 capsule (10 mg total) by mouth in the morning, at noon, in the evening, and at bedtime.   escitalopram (LEXAPRO) 20 MG tablet TAKE ONE TABLET BY MOUTH EVERY MORNING   ezetimibe (ZETIA) 10 MG tablet Take 1 tablet (10 mg total) by mouth daily.   famotidine (PEPCID) 20 MG tablet TAKE ONE TABLET BY MOUTH EVERY MORNING and TAKE ONE TABLET BY MOUTH EVERYDAY AT BEDTIME   isosorbide mononitrate (IMDUR) 60 MG 24 hr tablet Take 1 tablet (60 mg total) by mouth daily.   levothyroxine (SYNTHROID) 25 MCG tablet TAKE ONE TABLET BY MOUTH EVERY MORNING   lisinopril (ZESTRIL) 2.5 MG tablet Take 1 tablet (2.5 mg total) by mouth daily.   meclizine (ANTIVERT) 25 MG tablet TAKE ONE TABLET BY MOUTH twice daily AS NEEDED FOR dizziness   metoprolol succinate (TOPROL-XL) 25 MG 24 hr tablet Take 0.5 tablets (12.5 mg total) by mouth daily.   nitroGLYCERIN (NITROSTAT) 0.4 MG SL tablet Place 1 tablet (0.4 mg total) under the tongue every 5 (five) minutes as needed for chest pain.   nystatin (MYCOSTATIN/NYSTOP) powder APPLY powder TOPICALLY TO THE AFFECTED AREA(S) TWICE DAILY   pantoprazole (  PROTONIX) 40 MG tablet TAKE ONE TABLET BY MOUTH TWICE DAILY   potassium chloride SA (KLOR-CON M) 20 MEQ tablet TAKE ONE TABLET BY MOUTH AT NOON   Respiratory Therapy Supplies (NEBULIZER/TUBING/MOUTHPIECE) KIT Use as directed   sucralfate (CARAFATE) 1 g tablet TAKE ONE TABLET BY MOUTH FOUR TIMES DAILY. MAY cut pill in half AND DISSOLVE in water prior TO drinking (Patient not taking: Reported on 10/07/2021)   valACYclovir  (VALTREX) 500 MG tablet Take 1 tablet (500 mg total) by mouth 2 (two) times daily. (Patient not taking: Reported on 10/07/2021)   No facility-administered encounter medications on file as of 10/27/2021.    Patient Active Problem List   Diagnosis Date Noted   Pulmonary hypertension, unspecified ([place]) 09/17/2021   Morbid obesity ([place]) 09/17/2021   Acute vaginitis 09/14/2021   Atypical chest pain 08/26/2021   Memory loss 08/26/2021   Cough 04/21/2021   Back pain 12/03/2020   Acute cystitis without hematuria 12/03/2020   Grief reaction 12/03/2020   Fatty liver 12/01/2020   Intermittent left-sided chest pain 09/17/2020   Coronary artery disease involving native coronary artery of native heart 82/80/0349   Metabolic syndrome 17/91/5056   Prediabetes 09/17/2020   Allergy 09/16/2020   Anxiety 09/16/2020   Hypertension 09/16/2020   Vertigo 09/16/2020   Dark stools 09/08/2020   Dysphagia    Esophageal stricture    Hiatal hernia    Epigastric pain    Hypothyroid 01/25/2018   Mild CAD 10/03/2017   Stress-induced cardiomyopathy 09/19/2017   COPD (chronic obstructive pulmonary disease) ([place]) 09/14/2017   Hypokalemia 09/14/2017   Non-ST elevation (NSTEMI) myocardial infarction [facility]) - due to Demand Infarction 09/14/2017   CKD (chronic kidney disease) 04/04/2017   HLD (hyperlipidemia)    Allergic rhinitis    Gout 02/28/2017   Depression with anxiety 02/28/2017   Epigastric abdominal pain 12/22/2016   COPD with chronic bronchitis ([place]) 12/22/2016   GERD (gastroesophageal reflux disease) 12/22/2016   DOE (dyspnea on exertion) 12/22/2016   Peptic ulcer 01/04/2003    Conditions to be addressed/monitored: HTN and COPD  Care Plan : RN Care Manager Plan of Care  Updates made by Luretha Rued, RN since 10/27/2021 12:00 AM  Completed 10/27/2021   Problem: Chronic disease Managment education and/or care coordination needs Resolved 10/27/2021  Priority: High     Long-Range Goal:  Development of Plan of Care for Chronic Disease Managment and/or care coordination needs Completed 10/27/2021  Start Date: 05/04/2021  Expected End Date: 12/16/2021  Priority: High  Note:   Case closed: goal met Current Barriers: Veronica Wolf reports she is doing well today. She denies any problems with diarrhea (problem at last telephone encounter). Veronica Wolf denies any issues with breathing. She confirmed receiving Blood pressure monitor provided by [facility]. She reports her daughter is supportive and assist her with keeping up with her healthcare/appointments. She states she has her prescribed medications and is without questions at this time. She reports a tooth ache last week, but states she has followed up with her a dentist with plans to remove some teeth. Denies any pain at this time. She denies any breathing problems at this time. Discussed case closure. Patient instructed to contact provider with questions concerns and contact provider if Care Management/Care coordination needs in the future. Care Coordination needs related to Limited social support and Lacks knowledge of community resource: grieving loss of her brother  Chronic Disease Management support and education needs related to HTN and COPD Medication management/adherence-clinical pharmacist involved Does  not have blood pressure monitor-She states she knows where she can get a blood pressure monitor and has made a note to self to obtain. Memory-patient reports she is being referred to a neurologist regarding memory loss.  RNCM Clinical Goal(s):  Patient will verbalize understanding of plan for management of HTN and COPD as evidenced by self report and/or chart notatation demonstrate improved adherence to prescribed treatment plan for HTN and COPD as evidenced by self report and/or chart notation continue to work with RN Care Manager and/or Social Worker to address care management and care coordination needs related to HTN and COPD as  evidenced by adherence to CM Team Scheduled appointments     work with [employer] care guide to address needs related to Limited social support, Transport planner, and [employer] knowledge of community resource: report knowledge of Secondary school teacher as evidenced by patient and/or community resource care guide support    through collaboration with Consulting civil engineer, provider, and care team.   Interventions: 1:1 collaboration with primary care provider regarding development and update of comprehensive plan of care as evidenced by provider attestation and co-signature Inter-disciplinary care team collaboration (see longitudinal plan of care) Evaluation of current treatment plan related to  self management and patient's adherence to plan as established by provider LCSW referral regarding recent loss of her brother, loss of independence, decrease in social activities-continues to work with [employer] Patient in good spirits today. Reviewed previous resources to increase socialization. Update primary care provider  COPD Interventions:  (Status:  Goal Met.) Long Term Goal Reports no problems with breathing Patient encouraged to continue to take medications as prescribed Encouraged to attend provider visits as scheduled  Hypertension Interventions:  (Status:  Goal Met.) Long Term Goal RNCM provided blood pressure monitor and patient confirms receipt. She states she will take to pharmacy, provider office and or ask children for instructions on how to use. BP Readings from Last 3 Encounters:  09/17/21 130/70  09/14/21 (!) 110/57  08/26/21 125/75  Most recent eGFR/CrCl: No results found for: "EGFR"  No components found for: "CRCL"  Clinical pharmacist remains involved for medication adherence/management. Patient encouraged to continue to take as prescribed  Confirmed blood pressure monitor obtained Discussed target blood pressure <130/80(per clinical pharmacist).  Instructed patient to discuss  target range with provider at next office visit.  Patient Goals/Self-Care Activities: Take medications as prescribed   Attend all scheduled provider appointments Keep track of how you are feeling and any symptoms. Contact your primary care provider for any health concerns or questions Keep a notepad near you to write down your reminders Contact: Marvia Pickles. [place]: [phone] and [employer] [phone] for Senior activities programs   Plan: No further follow up required: Patient instructed to contact primary care provider if care management and/or care coordination needs in the future.  Thea Silversmith, RN, MSN, BSN, CCM Care Management Coordinator [facility] [phone]

## 2021-11-02 ENCOUNTER — Telehealth: Payer: Self-pay | Admitting: Family

## 2021-11-02 ENCOUNTER — Other Ambulatory Visit: Payer: Self-pay | Admitting: Cardiology

## 2021-11-02 ENCOUNTER — Other Ambulatory Visit: Payer: Self-pay | Admitting: Family

## 2021-11-02 DIAGNOSIS — F4321 Adjustment disorder with depressed mood: Secondary | ICD-10-CM

## 2021-11-02 DIAGNOSIS — E039 Hypothyroidism, unspecified: Secondary | ICD-10-CM

## 2021-11-02 DIAGNOSIS — J449 Chronic obstructive pulmonary disease, unspecified: Secondary | ICD-10-CM | POA: Diagnosis not present

## 2021-11-02 DIAGNOSIS — I10 Essential (primary) hypertension: Secondary | ICD-10-CM

## 2021-11-02 DIAGNOSIS — E782 Mixed hyperlipidemia: Secondary | ICD-10-CM

## 2021-11-02 NOTE — Telephone Encounter (Signed)
Requesting: diazepam 10mg   Contract: 03/14/20 UDS: 03/05/21 Last Visit: 09/14/21 Next Visit: None Last Refill: 09/30/21 #45 and 0RF  Please Advise

## 2021-11-02 NOTE — Telephone Encounter (Signed)
Pt called stating that she had put a few medications in a box that she hadn't touched for a few months. She was wondering what she needed to do with them considering they may be important. Pt would like a call to go over medications and how to proceed with them when possible.

## 2021-11-03 NOTE — Telephone Encounter (Signed)
Patient gets her medications packaged from [facility]. These are medications that were filled thru [employer] at the beginning of 2023 and are "Extra"; I have discussed with her in the past NOT to take any of the medications in the "Extra box".  Called patient and also consulted with [facility]. Patient is scheduled to start new packaging Sunday 11/08/2021 however pharmacy does not have her scheduled for delivery until Mondays 11/09/2021.  [facility] technician has moved patient's delivery date to Friday 11/06/2021.  Upon further questioning patient she states she has run out of packaged medications today but should have had enough until Sunday.  I was suppose to see patient for follow up Thursday 11/05/2021 but will move up to tomorrow 11/04/2021 at 8am and will do a home visit to review her medications.

## 2021-11-03 NOTE — Telephone Encounter (Signed)
Thank you Tammy!

## 2021-11-03 NOTE — Telephone Encounter (Signed)
Review medication list with patient and she reports she did not take allopurinol, atorvastatin, ezetimibe, fomotidine, isosorbide, levothyroxine, lisinopril, metoprolol, pantorazole or potassium chloride for a few weeks. "She found them in a container and wanted to know if she should be taking".  Per provider, patient was advised to re-start taking all as prescribed.  Provider suggest she meets with Tammy E. Patient has meet with before and will like to talk to her again.  Patient will like to get a call from Tammy E. To set up a meeting.

## 2021-11-04 ENCOUNTER — Ambulatory Visit (INDEPENDENT_AMBULATORY_CARE_PROVIDER_SITE_OTHER): Payer: Medicare PPO | Admitting: Pharmacist

## 2021-11-04 DIAGNOSIS — E782 Mixed hyperlipidemia: Secondary | ICD-10-CM

## 2021-11-04 DIAGNOSIS — M109 Gout, unspecified: Secondary | ICD-10-CM

## 2021-11-04 DIAGNOSIS — K219 Gastro-esophageal reflux disease without esophagitis: Secondary | ICD-10-CM

## 2021-11-04 DIAGNOSIS — I10 Essential (primary) hypertension: Secondary | ICD-10-CM

## 2021-11-04 DIAGNOSIS — E039 Hypothyroidism, unspecified: Secondary | ICD-10-CM

## 2021-11-04 DIAGNOSIS — I2510 Atherosclerotic heart disease of native coronary artery without angina pectoris: Secondary | ICD-10-CM

## 2021-11-04 NOTE — Patient Instructions (Signed)
Ms Heitman,  It was a pleasure speaking with you today Below is a summary of your health goals. You can also find an updated Chronic Care Management Care Plan at the end of this After Visit Summary.   Patient Goals/Self-Care Activities Over the next 90 days, patient will:  take medications as prescribed focus on medication adherence by filing prescriptions at one pharmacy and using adherence packaging Consider ordering blood pressure cuff with [employer] over-the-counter benefits.   Discussed tips to help her remember to take nighttime medications more consistently - set alarm on cell phone, place medications be bedside or with toothbrush.  These medications are NOT included in your packaging: '  - meclizine 25mg - take 1 tablet twice a day as needed for dizziness - nitroglycerine 0.4mg - place 1 tablet under the tongue and allow to dissolve if needed for chest pain. May repeat in 5 minutes. If still no relief of chest pain, call 911.   - diazepam 10mg - take 1 tablet if needed for anxiety up to every 12 hours.   - dicyclomine 10mg - take 1 capsules 4 times a day - morning, lunch, dinner and bedtime for abdominal pain.    If you have any questions or concerns, please feel free to contact me either at the phone number below or with a MyChart message.   Keep up the good work!  Naveen Clardy, PharmD Clinical Pharmacist [facility] [phone] (direct line)  [phone] (main office number)   The patient verbalized understanding of instructions, educational materials, and care plan provided today and agreed to receive a mailed copy of patient instructions, educational materials, and care plan.   

## 2021-11-04 NOTE — Chronic Care Management (AMB) (Signed)
Chronic Care Management Pharmacy Note  11/04/2021 Name:  Veronica Wolf MRN:  [id number] DOB:  [birth year]  Summary: Patient continues to receive medications from her pharmacy in adherence packaging to help her to take the right medications daily and at the right time however she has no packages left but per pharmacy she should have enough medication packs to last until Sunday, August 6th (she is 4 days short).  Visited patient in her home to help with medications. Uses her "Extra" bottles of maintenance that were left over from [place] filling in January 2023 to fill daily pill container / baggies for the next 4 days until new packaging arrives.  She also has started generic Augmentin 500g 3 times a day for 7 days after dental procedure (start date 11/03/2021). This will not be included in patient's adherence package so I made baggies with date and times to take until she completes course the morning of 11/10/21 She has several medication bottles of extra medication that was from a fill in January by [facility]. Reminded patient that these were extra and included in her adherence packaging so she did not need to take anything from those bottles. Labeled these meds as extra to help patient remember in furture.   We also reviewed medications that she takes as needed which are not in her packaging:   - meclizine 25mg  - take 1 tablet twice a day as needed for dizziness - nitroglycerine 0.4mg  - place 1 tablet under the tongue and allow to dissolve if needed for chest pain. May repeat in 5 minutes. If still no relief of chest pain, call 911.   - diazepam 10mg  - take 1 tablet if needed for anxiety up to every 12 hours.   - dicyclomine 10mg  - take 1 capsules 4 times a day - morning, lunch, dinner and bedtime for abdominal pain.   Phone follow up planned for 1 month.   Subjective: Veronica Wolf is an 81 y.o. year old female who is a primary patient of Debbrah Alar, NP.  The CCM team was  consulted for assistance with disease management and care coordination needs.    Engaged with patient face to face for follow up visit in response to provider referral for pharmacy case management and/or care coordination services.   Consent to Services:  The patient was given information about Chronic Care Management services, agreed to services, and gave verbal consent prior to initiation of services.  Please see initial visit note for detailed documentation.   Patient Care Team: Debbrah Alar, NP as PCP - General (Internal Medicine) Berniece Salines, DO as PCP - Cardiology (Cardiology) Cherre Robins, RPH-CPP (Pharmacist)  Recent office visits: 09/14/2021 - Int Med Inda Castle, NP) Acute visit for acute vaginitis. Labs checked - HSV +. Prescribed valacyclovir 500mg  twice a day for 7 days.  08/26/2021 - Int Med Inda Castle, NP) Acute visit for atypical chest pain. Labs checked. Referred to cardiologist. Also concerned about memory issues - referred to neurologist. Discussed with patient's daughter about memory issues and daughter agreed to try to help patient out.  04/21/2021 - Internal med Inda Castle) Phone visit for cough, shortness of breath and wheezing. Patient encouraged to go to ED.   Recent consult visits: 09/17/2021 - Cardio (Dr Harriet Masson) F/U CAD. Patient reports chest pain. Unable to get Stress test done. Increased isosorbide to 60mg  daily. Continue ASA and statin    Hospital visits: 04/21/2021 ED Visit at [facility]. URI leading to COPD exacerbation. Prescribed  doxycycline, Augmentin, prednisone and benzonatate at discharge.    Objective:  Lab Results  Component Value Date   CREATININE 1.52 (H) 08/26/2021   CREATININE 1.63 (H) 04/21/2021   CREATININE 1.40 (H) 01/09/2021    Lab Results  Component Value Date   HGBA1C 6.5 01/09/2021   Last diabetic Eye exam: No results found for: "HMDIABEYEEXA"  Last diabetic Foot exam: No results found for: "HMDIABFOOTEX"       Component Value Date/Time   CHOL 127 08/26/2021 0905   TRIG 135.0 08/26/2021 0905   HDL 43.10 08/26/2021 0905   CHOLHDL 3 08/26/2021 0905   VLDL 27.0 08/26/2021 0905   LDLCALC 57 08/26/2021 0905   LDLCALC 108 (H) 03/14/2020 1439       Latest Ref Rng & Units 08/26/2021    9:05 AM 04/21/2021    6:05 PM 01/09/2021    8:10 AM  Hepatic Function  Total Protein 6.0 - 8.3 g/dL 6.7  6.6  6.7   Albumin 3.5 - 5.2 g/dL 3.9  3.1  3.8   AST 0 - 37 U/L $Remo'13  14  14   'UqBbX$ ALT 0 - 35 U/L $Remo'7  10  6   'RdhkS$ Alk Phosphatase 39 - 117 U/L 76  70  82   Total Bilirubin 0.2 - 1.2 mg/dL 0.4  0.5  0.3   Bilirubin, Direct 0.0 - 0.2 mg/dL  0.1      Lab Results  Component Value Date/Time   TSH 4.80 08/26/2021 09:05 AM   TSH 3.57 08/18/2020 12:03 PM   FREET4 0.85 01/24/2018 03:04 PM       Latest Ref Rng & Units 04/21/2021    3:16 PM 09/08/2020   11:03 AM 06/11/2020    2:00 PM  CBC  WBC 4.0 - 10.5 K/uL 16.2  7.3  9.1   Hemoglobin 12.0 - 15.0 g/dL 11.3  11.6  11.5   Hematocrit 36.0 - 46.0 % 36.1  36.1  38.1   Platelets 150 - 400 K/uL 210  219.0  219     No results found for: "VD25OH"  Clinical ASCVD: Yes  The ASCVD Risk score (Arnett DK, et al., 2019) failed to calculate for the following reasons:   The 2019 ASCVD risk score is only valid for ages 39 to 73   The patient has a prior MI or stroke diagnosis    Other:  DEXA 03/05/2014             AP LUMBAR SPINE not used for calculation due to significant degenerative changes.             Left FOREARM (1/3 RADIUS) T Score: -0.4             Left FEMUR neck T-Score: -0.7     Social History   Tobacco Use  Smoking Status Former   Types: Cigarettes   Quit date: 04/24/2000   Years since quitting: 21.5   Passive exposure: Past  Smokeless Tobacco Never   BP Readings from Last 3 Encounters:  09/17/21 130/70  09/14/21 (!) 110/57  08/26/21 125/75   Pulse Readings from Last 3 Encounters:  09/17/21 63  09/14/21 74  08/26/21 86   Wt Readings from Last 3  Encounters:  09/17/21 189 lb 12.8 oz (86.1 kg)  09/14/21 190 lb (86.2 kg)  08/26/21 188 lb (85.3 kg)    Assessment: Review of patient past medical history, allergies, medications, health status, including review of consultants reports, laboratory and other test data, was performed as  part of comprehensive evaluation and provision of chronic care management services.   SDOH:  (Social Determinants of Health) assessments and interventions performed:       CCM Care Plan  Allergies  Allergen Reactions   Ibuprofen Other (See Comments)    Pt has hx of peptic ulcer and diverticulitis.     Medications Reviewed Today     Reviewed by Cherre Robins, RPH-CPP (Pharmacist) on 11/04/21 at [place] List Status: <None>   Medication Order Taking? Sig Documenting Provider Last Dose Status Informant  albuterol (PROVENTIL) (2.5 MG/3ML) 0.083% nebulizer solution [id number] Yes USE 1 VIAL IN NEBULIZER EVERY 6 HOURS AS NEEDED FOR WHEEZING AND FOR SHORTNESS OF Thayer Headings, NP Taking Active   albuterol (VENTOLIN HFA) 108 (90 Base) MCG/ACT inhaler [id number] Yes Inhale 2 puffs into the lungs every 6 (six) hours as needed for wheezing or shortness of breath. Debbrah Alar, NP Taking Active   allopurinol (ZYLOPRIM) 100 MG tablet [id number] Yes TAKE TWO TABLETS BY MOUTH ONCE DAILY Debbrah Alar, NP Taking Active   aspirin 81 MG EC tablet [id number] Yes Take 1 tablet (81 mg total) by mouth daily. Swallow whole. Berniece Salines, DO Taking Active            Med Note Antony Contras, [place] [facility]   Wed Oct 07, 2021 11:19 AM)    atorvastatin (LIPITOR) 80 MG tablet [id number] Yes TAKE ONE TABLET BY MOUTH EVERYDAY AT BEDTIME Tobb, Kardie, DO Taking Active   colchicine 0.6 MG tablet [id number] No Take 0.6 mg by mouth daily as needed (gout).  Patient not taking: Reported on 11/04/2021   [provider] Not Taking Active   diazepam (VALIUM) 10 MG tablet [id number] Yes TAKE ONE TABLET BY MOUTH EVERY TWELVE  HOURS AS NEEDED FOR ANXIETY Debbrah Alar, NP Taking Active   dicyclomine (BENTYL) 10 MG capsule [id number] Yes Take 1 capsule (10 mg total) by mouth in the morning, at noon, in the evening, and at bedtime. Debbrah Alar, NP Taking Active   escitalopram (LEXAPRO) 20 MG tablet [id number] Yes TAKE ONE TABLET BY MOUTH EVERY MORNING Debbrah Alar, NP Taking Active   ezetimibe (ZETIA) 10 MG tablet [id number] Yes TAKE ONE TABLET BY MOUTH ONCE DAILY Tobb, Kardie, DO Taking Active   famotidine (PEPCID) 20 MG tablet [id number] Yes TAKE ONE TABLET BY MOUTH EVERY MORNING and TAKE ONE TABLET BY MOUTH EVERYDAY AT BEDTIME Debbrah Alar, NP Taking Active   isosorbide mononitrate (IMDUR) 60 MG 24 hr tablet [id number] Yes Take 1 tablet (60 mg total) by mouth daily. Berniece Salines, DO Taking Active   levothyroxine (SYNTHROID) 25 MCG tablet [id number] Yes TAKE ONE TABLET BY MOUTH EVERY MORNING Debbrah Alar, NP Taking Active   lisinopril (ZESTRIL) 2.5 MG tablet [id number] Yes TAKE ONE TABLET BY MOUTH EVERYDAY AT BEDTIME Tobb, Kardie, DO Taking Active   meclizine (ANTIVERT) 25 MG tablet [id number] Yes TAKE ONE TABLET BY MOUTH twice daily AS NEEDED FOR dizziness Debbrah Alar, NP Taking Active   metoprolol succinate (TOPROL-XL) 25 MG 24 hr tablet [id number] Yes TAKE 1/2 TABLET BY MOUTH EVERY EVENING Tobb, Kardie, DO Taking Active   nitroGLYCERIN (NITROSTAT) 0.4 MG SL tablet [id number] No Place 1 tablet (0.4 mg total) under the tongue every 5 (five) minutes as needed for chest pain.  Patient not taking: Reported on 11/04/2021   Berniece Salines, DO Not Taking Active   nystatin (MYCOSTATIN/NYSTOP) powder [id number] Yes APPLY powder TOPICALLY TO THE AFFECTED AREA(S) TWICE DAILY Debbrah Alar, NP Taking Active  pantoprazole (PROTONIX) 40 MG tablet [id number] Yes TAKE ONE TABLET BY MOUTH TWICE DAILY Debbrah Alar, NP Taking Active   potassium chloride SA (KLOR-CON M) 20 MEQ tablet [id number] Yes  TAKE ONE TABLET BY MOUTH AT [place] Bruin, Lenna Sciara, NP Taking Active   Respiratory Therapy Supplies (NEBULIZER/TUBING/MOUTHPIECE) KIT [id number] Yes Use as directed Debbrah Alar, NP Taking Active   sucralfate (CARAFATE) 1 g tablet [id number] Yes TAKE ONE TABLET BY MOUTH FOUR TIMES DAILY. MAY cut pill in half AND DISSOLVE in water prior TO drinking Debbrah Alar, NP Taking Active             Patient Active Problem List   Diagnosis Date Noted   Pulmonary hypertension, unspecified ([place]) 09/17/2021   Morbid obesity ([place]) 09/17/2021   Acute vaginitis 09/14/2021   Atypical chest pain 08/26/2021   Memory loss 08/26/2021   Cough 04/21/2021   Back pain 12/03/2020   Acute cystitis without hematuria 12/03/2020   Grief reaction 12/03/2020   Fatty liver 12/01/2020   Intermittent left-sided chest pain 09/17/2020   Coronary artery disease involving native coronary artery of native heart 20/94/7096   Metabolic syndrome 28/36/6294   Prediabetes 09/17/2020   Allergy 09/16/2020   Anxiety 09/16/2020   Hypertension 09/16/2020   Vertigo 09/16/2020   Dark stools 09/08/2020   Dysphagia    Esophageal stricture    Hiatal hernia    Epigastric pain    Hypothyroid 01/25/2018   Mild CAD 10/03/2017   Stress-induced cardiomyopathy 09/19/2017   COPD (chronic obstructive pulmonary disease) ([place]) 09/14/2017   Hypokalemia 09/14/2017   Non-ST elevation (NSTEMI) myocardial infarction [facility]) - due to Demand Infarction 09/14/2017   CKD (chronic kidney disease) 04/04/2017   HLD (hyperlipidemia)    Allergic rhinitis    Gout 02/28/2017   Depression with anxiety 02/28/2017   Epigastric abdominal pain 12/22/2016   COPD with chronic bronchitis ([place]) 12/22/2016   GERD (gastroesophageal reflux disease) 12/22/2016   DOE (dyspnea on exertion) 12/22/2016   Peptic ulcer 01/04/2003    Immunization History  Administered Date(s) Administered   Fluad Quad(high Dose 65+) 03/14/2020, 12/03/2020    Influenza, High Dose Seasonal PF 01/23/2018, 12/07/2018   Influenza-Unspecified 12/23/2018   PFIZER(Purple Top)SARS-COV-2 Vaccination 05/14/2019, 06/08/2019   Pneumococcal Conjugate-13 07/16/2015   Pneumococcal Polysaccharide-23 12/23/2016   Tdap 09/02/2009, 04/05/2017    Conditions to be addressed/monitored: CAD, HTN, HLD, COPD, Anxiety, Depression and gout, GERD ; hypothyroidism  Care Plan : General Pharmacy (Adult)  Updates made by Cherre Robins, RPH-CPP since 11/04/2021 12:00 AM     Problem: Medication management and Monitoring and Coordination of Care for Chronic Conditions   Priority: High  Onset Date: 09/02/2020     Long-Range Goal: Medication management   Start Date: 09/02/2020  Recent Progress: On track  Priority: High  Note:    Current Barriers:  Unable to achieve control of hyperlipidemia  Does not adhere to prescribed medication regimen Does not maintain contact with provider office Sometimes forgets to take night time dose of medications - improving  Pharmacist Clinical Goal(s):  Over the next 90 days, patient will achieve adherence to monitoring guidelines and medication adherence to achieve therapeutic efficacy achieve control of hyperlipidemia as evidenced by LDL < 70 adhere to prescribed medication regimen as evidenced by filling prescriptions on times contact provider office for questions/concerns as evidenced notation of same in electronic health record through collaboration with PharmD and provider.   Interventions: 1:1 collaboration with Debbrah Alar, NP regarding development and update of comprehensive plan of care as  evidenced by provider attestation and co-signature Inter-disciplinary care team collaboration (see longitudinal plan of care) Comprehensive medication review performed; medication list updated in electronic medical record  Hypertension: Controlled though blood pressure was a little low at last office visit BP goal is:  <130/80   Patient is not checking BP at home - states she does not have BP cuff.  Patient has failed these meds in the past: None noted  Current Regimen: Metoprolol Succinate 25mg  0.5 tab daily Lisinopril 2.5mg  daily Interventions:  Reviewed signs and symptoms of low blood pressure to monitor for.  Discussed blood pressure  goal Continue current medications for blood pressure   Hyperlipidemia / history of MI: Controlled; LDL goal < 70  Patient has failed these meds in past: None noted   In September 2022 patient reported she just stopped taking atorvastatin because she was taking so many other medications. She has been taking atrovastatin regularly since. LDL in May 2023 was 58 (previously was 125 in May of 2022)  Current therapy Atorvastatin 80mg  daily (increased 08/18/2020) Ezetimibe 10mg  daily (added 09/17/2020)  Metoprolol Succinate 25mg  1/2 tab daily Isosorbide ER 60mg  daily  Aspirin 81mg  daily  Nitroglycerin 0.4mg  - place 1 tablet under tongue as needed for chest pain, may repeat in 5 minutes if chest pain not relieved. Interventions: (addressed at previous visit) Discussed LDL goal Discussed importance of taking atorvastatin daily to both lower cholesterol and prevent ASCVD / recurrent MI; Adherence has improved since she started adherence packaging. Reminded patient to reschedule appointment with cardiologist.   Chronic Obstructive Pulmonary Disease: Controlled; last exacerbation due to upper respiratory infection 04/21/2021 Has not needed nebulizer treatment in the last month Current treatment: Albuterol solution for nebulizer - use in nebulizer up to every 6 hours as needed for shortness of breath Interventions:  Monitor exacerbations; Determine if escalation in therapy is needed.  Depression/Anxiety:  Improved; goal: decrease use of diazepam and decrease number of days per month with symptoms of grief / depression Current treatment: Escitalopram 20mg  daily Diazepam 10mg  every  12 hours as needed  Patient has failed these meds in past: citalopram (ineffective) Patient has been taking diazepam less recently. Only using 1 or 2 times per week when she has difficulty sleeping.  Reports her mood has been stable the last 2 months. She still grieves the loss of her brother September 10, 2020 but states grief has improved over the last 1 to 2  months. Her brother was like a father to her.  Interventions:  Discussed adherence and importance of taking escitalopram for depression / anxiety Encouraged patient to continue to use diazepam sparingly - only at night if needed.  Consider grief counseling (patient continues to decline). Patient will continue consults with Care Coordination LCSW  Hypothyroidism:  Patient is currently controlled on the following medications:  levothyroxine 57mcg daily  TSH was WNL at last check  Patient has failed these meds in past: None noted  Intervention:  Continue current therapy   Gout:   Goal Uric Acid < 6 mg/dL   Last uric acid was not at goal (UA = 8.4 on 03/14/2020) but patient denies symptoms of gout at this time.  Current medications:  Allopurinol 100mg  - take 2 tablets = 200mg  daily Colchicine 0.6mg  up to bid as needed for acute gout Medications that may increase uric acid levels: Aspirin Last gout flare: Feb 2022 Patient has failed these meds in past: None noted   Interventions:  Reviewed preventative versus treatment medications for gout.  Continue current therapy Consider checking Uric acid with next labs.    Medication management:   She started medication adherence packaging 01/08/2021. Current pharmacy: [facility] Intervention:  Comprehensive medication review completed.    Coordinated with [facility] regarding upcoming adherence packaging delivery. Assisted with putting medications in daily container to help take medications corrected until she starts next adherence packaging 11/08/2021 Education provided about how to order  over-the-counter medication from [facility].  Discussed tips to help her remember to take nighttime medications more consistently - set alarm on cell phone, place medications be bedside or with toothbrush.    Patient Goals/Self-Care Activities Over the next 90 days, patient will:  take medications as prescribed focus on medication adherence by filing prescriptions at one pharmacy and using adherence packaging Consider ordering blood pressure cuff with [employer] over-the-counter benefits.   Discussed tips to help her remember to take nighttime medications more consistently - set alarm on cell phone, place medications be bedside or with toothbrush.  These medications are NOT included in your packaging: '  - meclizine 25mg  - take 1 tablet twice a day as needed for dizziness - nitroglycerine 0.4mg  - place 1 tablet under the tongue and allow to dissolve if needed for chest pain. May repeat in 5 minutes. If still no relief of chest pain, call 911.   - diazepam 10mg  - take 1 tablet if needed for anxiety up to every 12 hours.   - dicyclomine 10mg  - take 1 capsules 4 times a day - morning, lunch, dinner and bedtime for abdominal pain.    Follow Up Plan: Telephone follow up appointment with care management team member scheduled for:  1 month           Medication Assistance: None required.  Patient affirms current coverage meets needs.  Patient's preferred pharmacy is:  [facility], [place] [address] Phone: [phone] Fax: [phone]  [facility] - [place], [place] - [address] [place] [address] Phone: [phone] Fax: [phone]   Follow Up:  Patient agrees to Care Plan and Follow-up.  Plan: phone follow up in 1 month.   Cherre Robins, PharmD Clinical Pharmacist [facility] [phone]

## 2021-11-05 ENCOUNTER — Ambulatory Visit: Payer: Medicare PPO

## 2021-11-06 ENCOUNTER — Telehealth: Payer: Self-pay | Admitting: Family

## 2021-11-06 ENCOUNTER — Other Ambulatory Visit: Payer: Self-pay

## 2021-11-06 ENCOUNTER — Emergency Department (HOSPITAL_BASED_OUTPATIENT_CLINIC_OR_DEPARTMENT_OTHER)
Admission: EM | Admit: 2021-11-06 | Discharge: 2021-11-06 | Disposition: A | Discharge status: Home / Self Care | Payer: Medicare PPO | Attending: Emergency Medicine | Admitting: Emergency Medicine

## 2021-11-06 ENCOUNTER — Emergency Department (HOSPITAL_BASED_OUTPATIENT_CLINIC_OR_DEPARTMENT_OTHER): Payer: Medicare PPO

## 2021-11-06 ENCOUNTER — Encounter (HOSPITAL_BASED_OUTPATIENT_CLINIC_OR_DEPARTMENT_OTHER): Payer: Self-pay

## 2021-11-06 DIAGNOSIS — K625 Hemorrhage of anus and rectum: Secondary | ICD-10-CM | POA: Insufficient documentation

## 2021-11-06 DIAGNOSIS — K5790 Diverticulosis of intestine, part unspecified, without perforation or abscess without bleeding: Secondary | ICD-10-CM | POA: Diagnosis not present

## 2021-11-06 DIAGNOSIS — R001 Bradycardia, unspecified: Secondary | ICD-10-CM | POA: Diagnosis not present

## 2021-11-06 DIAGNOSIS — K5730 Diverticulosis of large intestine without perforation or abscess without bleeding: Secondary | ICD-10-CM | POA: Insufficient documentation

## 2021-11-06 DIAGNOSIS — Z7982 Long term (current) use of aspirin: Secondary | ICD-10-CM | POA: Diagnosis not present

## 2021-11-06 DIAGNOSIS — K921 Melena: Secondary | ICD-10-CM | POA: Diagnosis not present

## 2021-11-06 DIAGNOSIS — I700 Atherosclerosis of aorta: Secondary | ICD-10-CM | POA: Diagnosis not present

## 2021-11-06 DIAGNOSIS — R109 Unspecified abdominal pain: Secondary | ICD-10-CM | POA: Diagnosis not present

## 2021-11-06 LAB — COMPREHENSIVE METABOLIC PANEL
ALT: 10 U/L (ref 0–44)
AST: 18 U/L (ref 15–41)
Albumin: 3.5 g/dL (ref 3.5–5.0)
Alkaline Phosphatase: 69 U/L (ref 38–126)
Anion gap: 4 — ABNORMAL LOW (ref 5–15)
BUN: 12 mg/dL (ref 8–23)
CO2: 24 mmol/L (ref 22–32)
Calcium: 8.4 mg/dL — ABNORMAL LOW (ref 8.9–10.3)
Chloride: 110 mmol/L (ref 98–111)
Creatinine, Ser: 1.45 mg/dL — ABNORMAL HIGH (ref 0.44–1.00)
GFR, Estimated: 36 mL/min — ABNORMAL LOW (ref >60)
Glucose, Bld: 102 mg/dL — ABNORMAL HIGH (ref 70–99)
Potassium: 4.6 mmol/L (ref 3.5–5.1)
Sodium: 138 mmol/L (ref 135–145)
Total Bilirubin: 0.5 mg/dL (ref 0.3–1.2)
Total Protein: 6.8 g/dL (ref 6.5–8.1)

## 2021-11-06 LAB — CBC
HCT: 37.8 % (ref 36.0–46.0)
Hemoglobin: 11.7 g/dL — ABNORMAL LOW (ref 12.0–15.0)
MCH: 27.8 pg (ref 26.0–34.0)
MCHC: 31 g/dL (ref 30.0–36.0)
MCV: 89.8 fL (ref 80.0–100.0)
Platelets: 213 10*3/uL (ref 150–400)
RBC: 4.21 MIL/uL (ref 3.87–5.11)
RDW: 18.2 % — ABNORMAL HIGH (ref 11.5–15.5)
WBC: 9.2 10*3/uL (ref 4.0–10.5)
nRBC: 0 % (ref 0.0–0.2)

## 2021-11-06 NOTE — Discharge Instructions (Addendum)
Summary of ER Visit.  1.) Slow heart rate -your resting heart rate was 45 to 50 bpm.  This is called bradycardia.  This is likely a side effect of the new medication called metoprolol that you were started on this week.  You will stop taking metoprolol until you see the cardiologist again this week.  Discussed this issue with the cardiologist.  2.) Blood in stool -this may be related to constipation and diverticulosis.  Please call the gastroenterology office to schedule follow-up appointment for this issue.  You may continue to have a little bit of blood in your stool for the next 2 to 3 days.  Generally the issues resolved.  If, however, you begin to feel lightheaded, shortness of breath, began having chest pain or pressure, feel extremely fatigued, or lose consciousness, please call 911 return to the hospital immediately.  These may be signs of worsening bleeding and anemia.Marland Kitchen

## 2021-11-06 NOTE — ED Triage Notes (Addendum)
Pt ambulatory to triage. Pt reports large amount of red blood in stool approx 8 am X 1 Reports increase pain in upper abdomen worsening this afternoon,.  Pt reports hx of GERD, but has not has a problem with it in months

## 2021-11-06 NOTE — Telephone Encounter (Signed)
Pt stated she was given some prescriptions yesterday by Tammy but realized she had other medications left over at her house. She would like to know which ones she should take. Please advise.

## 2021-11-06 NOTE — Telephone Encounter (Signed)
Tammy discussed w/ Pt- she has sent her to ED.

## 2021-11-06 NOTE — Telephone Encounter (Signed)
Patient received delivery of adherence packaged medications today from [facility], however packages do not start until 08/06/203.  When I was with her Wednesday for home visit I packaged enough of her medication to last until she starts [facility] medications.  She is to continue the packs I made and follow  dates and times on packs. She has written instructions about this that was provided 11/04/2021.   Patient also mentions that she recently went to bathroom and noticed blood. When asked if she thought it came from her urine or stool she said both. She denies abdominal pain. States she felt a little pressure - "Like I knew I needed to go to the bathroom".  She reported that this morning she took an aspirin 81mg  - reminded her that she does not need to take any additional aspirin. She has aspirin 81mg  in her packaging. Patient voiced understanding.  There were no work in spots in our office this afternoon.  Recommended patient go to ER at [facility] for evaluation. Patient agrees and will have her grandson bring her.

## 2021-11-06 NOTE — Telephone Encounter (Signed)
Patient called to speak to Tammy regarding a medication that she took today, she stated that after taking it she went to pee and noticed that a lot of blood came out. Asked patient if she was still bleeding and she stated not anymore that it happened just when she peed. Patient was triage for further assistance.

## 2021-11-06 NOTE — ED Provider Notes (Signed)
[facility] Provider Note   CSN: [id number] Arrival date & time: 11/06/21  1535     History {Add pertinent medical, surgical, social history, OB history to HPI:1} Chief Complaint  Patient presents with   Rectal Bleeding    Veronica Wolf is a 81 y.o. female presenting to ED with complaint of blood in stool.  She reports she has been constipated past several days.  She does have a history of severe sigmoid diverticulosis.  She reports she had a bowel movement today and it was significant mount of blood streaked in her stool.  She denies lightheadedness.  She states is never happened before.  She is not on blood thinners.  She did have an endoscopy performed 3 months ago in May per my review of external records which showed esophageal stricture but no significant findings of the stomach or duodenum.  HPI     Home Medications Prior to Admission medications   Medication Sig Start Date End Date Taking? Authorizing Provider  aspirin 81 MG EC tablet Take 1 tablet (81 mg total) by mouth daily. Swallow whole. 10/28/20  Yes Tobb, Kardie, DO  atorvastatin (LIPITOR) 80 MG tablet TAKE ONE TABLET BY MOUTH EVERYDAY AT BEDTIME 11/03/21  Yes Tobb, Kardie, DO  escitalopram (LEXAPRO) 20 MG tablet TAKE ONE TABLET BY MOUTH EVERY MORNING 09/24/21  Yes Debbrah Alar, NP  isosorbide mononitrate (IMDUR) 60 MG 24 hr tablet Take 1 tablet (60 mg total) by mouth daily. 09/17/21  Yes Tobb, Kardie, DO  levothyroxine (SYNTHROID) 25 MCG tablet TAKE ONE TABLET BY MOUTH EVERY MORNING 08/25/21  Yes Debbrah Alar, NP  lisinopril (ZESTRIL) 2.5 MG tablet TAKE ONE TABLET BY MOUTH EVERYDAY AT BEDTIME 11/03/21  Yes Tobb, Kardie, DO  albuterol (PROVENTIL) (2.5 MG/3ML) 0.083% nebulizer solution USE 1 VIAL IN NEBULIZER EVERY 6 HOURS AS NEEDED FOR WHEEZING AND FOR SHORTNESS OF BREATH 03/20/21   Debbrah Alar, NP  albuterol (VENTOLIN HFA) 108 (90 Base) MCG/ACT inhaler Inhale 2 puffs into the  lungs every 6 (six) hours as needed for wheezing or shortness of breath. 08/26/21   Debbrah Alar, NP  allopurinol (ZYLOPRIM) 100 MG tablet TAKE TWO TABLETS BY MOUTH ONCE DAILY 08/25/21   Debbrah Alar, NP  amoxicillin-clavulanate (AUGMENTIN) 500-125 MG tablet Take 1 tablet by mouth 3 (three) times daily. 11/03/21   [provider]  chlorhexidine (PERIDEX) 0.12 % solution SMARTSIG:0.5 Ounce(s) By Mouth Twice Daily 11/03/21   [provider]  colchicine 0.6 MG tablet Take 0.6 mg by mouth daily as needed (gout). Patient not taking: Reported on 11/04/2021    [provider]  diazepam (VALIUM) 10 MG tablet TAKE ONE TABLET BY MOUTH EVERY TWELVE HOURS AS NEEDED FOR ANXIETY 11/02/21   Debbrah Alar, NP  dicyclomine (BENTYL) 10 MG capsule Take 1 capsule (10 mg total) by mouth in the morning, at noon, in the evening, and at bedtime. 03/20/21   Debbrah Alar, NP  ezetimibe (ZETIA) 10 MG tablet TAKE ONE TABLET BY MOUTH ONCE DAILY 11/03/21   Tobb, Kardie, DO  famotidine (PEPCID) 20 MG tablet TAKE ONE TABLET BY MOUTH EVERY MORNING and TAKE ONE TABLET BY MOUTH EVERYDAY AT BEDTIME 08/25/21   Debbrah Alar, NP  meclizine (ANTIVERT) 25 MG tablet TAKE ONE TABLET BY MOUTH twice daily AS NEEDED FOR dizziness 10/12/21   Debbrah Alar, NP  metoprolol succinate (TOPROL-XL) 25 MG 24 hr tablet TAKE 1/2 TABLET BY MOUTH EVERY EVENING 11/03/21   Tobb, Kardie, DO  nitroGLYCERIN (NITROSTAT) 0.4  MG SL tablet Place 1 tablet (0.4 mg total) under the tongue every 5 (five) minutes as needed for chest pain. Patient not taking: Reported on 11/04/2021 10/28/20   Tobb, Godfrey Pick, DO  nystatin (MYCOSTATIN/NYSTOP) powder APPLY powder TOPICALLY TO THE AFFECTED AREA(S) TWICE DAILY 10/26/21   Debbrah Alar, NP  pantoprazole (PROTONIX) 40 MG tablet TAKE ONE TABLET BY MOUTH TWICE DAILY 11/02/21   Debbrah Alar, NP  potassium chloride SA (KLOR-CON M) 20 MEQ tablet TAKE ONE TABLET BY MOUTH AT  NOON 04/06/21   Debbrah Alar, NP  Respiratory Therapy Supplies (NEBULIZER/TUBING/MOUTHPIECE) KIT Use as directed 08/26/21   Debbrah Alar, NP  sucralfate (CARAFATE) 1 g tablet TAKE ONE TABLET BY MOUTH FOUR TIMES DAILY. MAY cut pill in half AND DISSOLVE in water prior TO drinking 03/20/21   Debbrah Alar, NP      Allergies    Ibuprofen    Review of Systems   Review of Systems  Physical Exam Updated Vital Signs BP 131/63   Pulse 60   Temp 97.7 F (36.5 C) (Oral)   Resp 20   Ht $R'5\' 4"'Co$  (1.626 m)   Wt 85.3 kg   SpO2 100%   BMI 32.27 kg/m  Physical Exam Constitutional:      General: She is not in acute distress. HENT:     Head: Normocephalic and atraumatic.  Eyes:     Conjunctiva/sclera: Conjunctivae normal.     Pupils: Pupils are equal, round, and reactive to light.  Cardiovascular:     Rate and Rhythm: Normal rate and regular rhythm.  Pulmonary:     Effort: Pulmonary effort is normal. No respiratory distress.  Abdominal:     General: There is no distension.     Tenderness: There is no abdominal tenderness.  Skin:    General: Skin is warm and dry.  Neurological:     General: No focal deficit present.     Mental Status: She is alert. Mental status is at baseline.  Psychiatric:        Mood and Affect: Mood normal.        Behavior: Behavior normal.     ED Results / Procedures / Treatments   Labs (all labs ordered are listed, but only abnormal results are displayed) Labs Reviewed  COMPREHENSIVE METABOLIC PANEL  CBC  POC OCCULT BLOOD, ED  ABO/RH    EKG None  Radiology No results found.  Procedures Procedures  {Document cardiac monitor, telemetry assessment procedure when appropriate:1}  Medications Ordered in ED Medications - No data to display  ED Course/ Medical Decision Making/ A&P                           Medical Decision Making Amount and/or Complexity of Data Reviewed Labs: ordered.   This patient presents to the ED with  concern for blood in stool. This involves an extensive number of treatment options, and is a complaint that carries with it a high risk of complications and morbidity.  The differential diagnosis includes diverticulosis versus diverticulitis versus malignancy versus GI bleed versus other  Co-morbidities that complicate the patient evaluation: History of severe sigmoid diverticulosis raises concern for diverticular bleed in the setting of constipation  External records from outside source obtained and reviewed including upper endoscopy report from May as noted in history above  I ordered and personally interpreted labs.  The pertinent results include:  ***  I ordered imaging studies including *** I independently visualized and interpreted  imaging which showed *** I agree with the radiologist interpretation  The patient was maintained on a cardiac monitor.  I personally viewed and interpreted the cardiac monitored which showed an underlying rhythm of: ***  Per my interpretation the patient's ECG shows ***  I ordered medication including ***  for *** I have reviewed the patients home medicines and have made adjustments as needed  Test Considered: ***  I requested consultation with the ***,  and discussed lab and imaging findings as well as pertinent plan - they recommend: ***  After the interventions noted above, I reevaluated the patient and found that they have: {resolved/improved/worsened:23923::"improved"}  Social Determinants of Health:***  Dispostion:  After consideration of the diagnostic results and the patients response to treatment, I feel that the patent would benefit from ***.   {Document critical care time when appropriate:1} {Document review of labs and clinical decision tools ie heart score, Chads2Vasc2 etc:1}  {Document your independent review of radiology images, and any outside records:1} {Document your discussion with family members, caretakers, and with  consultants:1} {Document social determinants of health affecting pt's care:1} {Document your decision making why or why not admission, treatments were needed:1} Final Clinical Impression(s) / ED Diagnoses Final diagnoses:  None    Rx / DC Orders ED Discharge Orders     None

## 2021-11-06 NOTE — Telephone Encounter (Signed)
Noted  

## 2021-11-09 ENCOUNTER — Telehealth: Payer: Self-pay | Admitting: Pharmacist

## 2021-11-09 ENCOUNTER — Telehealth: Payer: Self-pay

## 2021-11-09 NOTE — Telephone Encounter (Signed)
Spoke with pt who stated that her symptoms have completely resolved. Pt stated she would call back for follow up if symptoms returned.

## 2021-11-09 NOTE — Telephone Encounter (Signed)
Noted that patient was instructed to hold metoprolol ER 25mg  - 0.5 tablet daily when she was seen in ER on 11/06/2021 due to bradycardia. Patient's medications are packaged in adherence packages so I tried to call to make sure she understood which medication in her evening package she was to leave off. Unable to reach patient. LM on VM with my contact number [phone]

## 2021-11-09 NOTE — Telephone Encounter (Signed)
Cirigliano, Veronica Dike, DO  Orion Modest, RN Patient was seen in the ER over the weekend for painless hematochezia and discharged home.  Can we please call to check in on her today.  Can schedule follow-up appointment with me or one of the APP's, particularly if any ongoing symptoms.

## 2021-11-09 NOTE — Telephone Encounter (Signed)
Patient evaluated/treated in ER.   [facility] - Client TELEPHONE ADVICE RECORD Access[facility]urse Patient Name:Veronica Wolf ---Caller states she has blood in her stool and also vaginal bleeding Does the patient have any new or worsening symptoms? ---Yes Will a triage be completed? ---Yes Related visit to physician within the last 2 weeks? ---No Does the PT have any chronic conditions? (i.e. diabetes, asthma, this includes High risk factors for pregnancy, etc.) ---Yes List chronic conditions. ---diabetes-pre Is this a behavioral health or substance abuse call? ---No Guidelines Guideline Title Affirmed Question Affirmed Notes Nurse Date/Time (Eastern Time) Rectal Bleeding SEVERE rectal bleeding (large blood clots; constant or on and off bleeding) Daphine Deutscher, RN, Melanie 11/06/2021 3:13:29 PM Disp. Time Lamount Cohen Time) Disposition Final User 11/06/2021 2:47:13 PM Attempt made - message left Maralyn Sago 11/06/2021 2:47:46 PM Attempt made - no message left Daphine Deutscher, RN, Shawna Orleans PLEASE NOTE: All timestamps contained within this report are represented as [place] Standard Time. CONFIDENTIALTY NOTICE: This fax transmission is intended only for the addressee. It contains information that is legally privileged, confidential or otherwise protected from use or disclosure. If you are not the intended recipient, you are strictly prohibited from reviewing, disclosing, copying using or disseminating any of this information or taking any action in reliance on or regarding this information. If you have received this fax in error, please notify us immediately by telephone so that we can arrange for its return to [place]. Phone: [phone], Toll-Free: [phone], Fax: [phone] Page: 2 of 2 Call Id: [id number] Disp. Time Lamount Cohen Time) Disposition Final User 11/06/2021 3:16:38 PM Go to ED Now Yes Daphine Deutscher, RN, Melanie Final Disposition 11/06/2021 3:16:38 PM Go to ED Now Yes Daphine Deutscher, RN,  Mittie Bodo Disagree/Comply Comply Caller Understands Yes PreDisposition Call Doctor Care Advice Given Per Guideline GO TO ED NOW: * You need to be seen in the Emergency Department. * Go to the ED at ___________ Hospital. CARE ADVICE given per Rectal Bleeding (Adult) guideline. Referrals [facility] - ED

## 2021-11-13 NOTE — Telephone Encounter (Signed)
Called patient. She has been taking medication only from her packaging. However she forgot that she was to stop metoprolol 0.5 tablet. Patient was instructed to take out evening dose of metoprolol 0.5 tablet. She voiced understanding. Will instruct pharmacy to remove from packaging for next monthly adherence packaging.  Patient endorses that she is feeling better. She has not noticed blood in stool since last week.

## 2021-11-24 ENCOUNTER — Other Ambulatory Visit: Payer: Self-pay | Admitting: Family

## 2021-11-26 ENCOUNTER — Other Ambulatory Visit: Payer: Self-pay | Admitting: Family

## 2021-11-26 NOTE — Telephone Encounter (Signed)
Requesting: diazepam 10mg   Contract: 03/14/20 UDS: 03/05/21 Last Visit: 09/14/21 Next Visit: None Last Refill: 11/02/21 #45 and 0RF  Please Advise

## 2021-12-01 ENCOUNTER — Telehealth: Payer: Medicare PPO

## 2021-12-02 ENCOUNTER — Other Ambulatory Visit: Payer: Self-pay | Admitting: Family

## 2021-12-02 DIAGNOSIS — R42 Dizziness and giddiness: Secondary | ICD-10-CM

## 2021-12-03 ENCOUNTER — Telehealth: Payer: Self-pay | Admitting: Family

## 2021-12-03 DIAGNOSIS — E039 Hypothyroidism, unspecified: Secondary | ICD-10-CM | POA: Diagnosis not present

## 2021-12-03 DIAGNOSIS — J449 Chronic obstructive pulmonary disease, unspecified: Secondary | ICD-10-CM

## 2021-12-03 DIAGNOSIS — E782 Mixed hyperlipidemia: Secondary | ICD-10-CM

## 2021-12-03 DIAGNOSIS — F32A Depression, unspecified: Secondary | ICD-10-CM

## 2021-12-03 DIAGNOSIS — I10 Essential (primary) hypertension: Secondary | ICD-10-CM | POA: Diagnosis not present

## 2021-12-03 NOTE — Telephone Encounter (Signed)
Patient reports she only had 2 days of packaged medication left at home. Called [facility] to check on delivery date as it looks like patient's medications were processed 11/30/2021. Per [facility] they have her delivery date set at Monday 12/07/2021.  Requested earlier delivery. [facility] will deliver adherence packaging tomorrow 12/04/2021.

## 2021-12-03 NOTE — Telephone Encounter (Signed)
Patient called to advise Tammy that she made a mistake and has not received the package in the mail yet.

## 2021-12-09 ENCOUNTER — Telehealth: Payer: Medicare PPO

## 2021-12-15 ENCOUNTER — Telehealth: Payer: Medicare PPO

## 2021-12-18 ENCOUNTER — Ambulatory Visit (INDEPENDENT_AMBULATORY_CARE_PROVIDER_SITE_OTHER): Payer: Medicare PPO | Admitting: Pharmacist

## 2021-12-18 DIAGNOSIS — K219 Gastro-esophageal reflux disease without esophagitis: Secondary | ICD-10-CM

## 2021-12-18 DIAGNOSIS — I10 Essential (primary) hypertension: Secondary | ICD-10-CM

## 2021-12-18 DIAGNOSIS — E782 Mixed hyperlipidemia: Secondary | ICD-10-CM

## 2021-12-18 NOTE — Chronic Care Management (AMB) (Signed)
Chronic Care Management Pharmacy Note  12/18/2021 Name:  Veronica Wolf MRN:  [id number] DOB:  [birth year]  Summary: Patient continues to receive medications from her pharmacy in adherence packaging to help her to take the right medications daily. She endorses that she has received adherence packaging at the end of August and has plenty of days left.  Patient endorses that she is taking metoprolol ER 25mg  0.5 tablet daily (dose decreased 11/06/2021 after ED visit) Reviewed medications that she takes as needed which are not in her packaging:   - meclizine 25mg  - take 1 tablet twice a day as needed for dizziness - nitroglycerin 0.4mg  - place 1 tablet under the tongue and allow to dissolve if needed for chest pain. May repeat in 5 minutes. If still no relief of chest pain, call 911.   - diazepam 10mg  - take 1 tablet if needed for anxiety up to every 12 hours.   - dicyclomine 10mg  - take 1 capsules 4 times a day - morning, lunch, dinner and bedtime for abdominal pain.   Patient had cold last week and she reports that is has been hard to recover. She states cough is improved. She still feels fatigued. She has requested her daughter come to stay with her today. Offered appointment to see PCP but patient states she is better just not 100% yet. Made appointment for 12/23/2021 with PCP incase she is still not feeling well. Patient is also aware she can been seen by urgent care or in ED at [facility] if conditions worsens. Reminded patient to hydrate.   Phone follow up planned for 1 month.   Subjective: HODA HON is an 81 y.o. year old female who is a primary patient of Debbrah Alar, NP.  The CCM team was consulted for assistance with disease management and care coordination needs.    Engaged with patient face to face for follow up visit in response to provider referral for pharmacy case management and/or care coordination services.   Consent to Services:  The patient was given  information about Chronic Care Management services, agreed to services, and gave verbal consent prior to initiation of services.  Please see initial visit note for detailed documentation.   Patient Care Team: Debbrah Alar, NP as PCP - General (Internal Medicine) Berniece Salines, DO as PCP - Cardiology (Cardiology) Cherre Robins, RPH-CPP (Pharmacist)  Recent office visits: 09/14/2021 - Int Med Inda Castle, NP) Acute visit for acute vaginitis. Labs checked - HSV +. Prescribed valacyclovir 500mg  twice a day for 7 days.  08/26/2021 - Int Med Inda Castle, NP) Acute visit for atypical chest pain. Labs checked. Referred to cardiologist. Also concerned about memory issues - referred to neurologist. Discussed with patient's daughter about memory issues and daughter agreed to try to help patient out.  04/21/2021 - Internal med Inda Castle) Phone visit for cough, shortness of breath and wheezing. Patient encouraged to go to ED.   Recent consult visits: 09/17/2021 - Cardio (Dr Harriet Masson) F/U CAD. Patient reports chest pain. Unable to get Stress test done. Increased isosorbide to 60mg  daily. Continue ASA and statin    Hospital visits: 04/21/2021 ED Visit at [facility]. URI leading to COPD exacerbation. Prescribed doxycycline, Augmentin, prednisone and benzonatate at discharge.    Objective:  Lab Results  Component Value Date   CREATININE 1.45 (H) 11/06/2021   CREATININE 1.52 (H) 08/26/2021   CREATININE 1.63 (H) 04/21/2021    Lab Results  Component Value Date   HGBA1C 6.5 01/09/2021  Last diabetic Eye exam: No results found for: "HMDIABEYEEXA"  Last diabetic Foot exam: No results found for: "HMDIABFOOTEX"      Component Value Date/Time   CHOL 127 08/26/2021 0905   TRIG 135.0 08/26/2021 0905   HDL 43.10 08/26/2021 0905   CHOLHDL 3 08/26/2021 0905   VLDL 27.0 08/26/2021 0905   LDLCALC 57 08/26/2021 0905   LDLCALC 108 (H) 03/14/2020 1439       Latest Ref Rng & Units 11/06/2021     4:22 PM 08/26/2021    9:05 AM 04/21/2021    6:05 PM  Hepatic Function  Total Protein 6.5 - 8.1 g/dL 6.8  6.7  6.6   Albumin 3.5 - 5.0 g/dL 3.5  3.9  3.1   AST 15 - 41 U/L $Remo'18  13  14   'dEofr$ ALT 0 - 44 U/L $Remo'10  7  10   'acggG$ Alk Phosphatase 38 - 126 U/L 69  76  70   Total Bilirubin 0.3 - 1.2 mg/dL 0.5  0.4  0.5   Bilirubin, Direct 0.0 - 0.2 mg/dL   0.1     Lab Results  Component Value Date/Time   TSH 4.80 08/26/2021 09:05 AM   TSH 3.57 08/18/2020 12:03 PM   FREET4 0.85 01/24/2018 03:04 PM       Latest Ref Rng & Units 11/06/2021    4:22 PM 04/21/2021    3:16 PM 09/08/2020   11:03 AM  CBC  WBC 4.0 - 10.5 K/uL 9.2  16.2  7.3   Hemoglobin 12.0 - 15.0 g/dL 11.7  11.3  11.6   Hematocrit 36.0 - 46.0 % 37.8  36.1  36.1   Platelets 150 - 400 K/uL 213  210  219.0     No results found for: "VD25OH"  Clinical ASCVD: Yes  The ASCVD Risk score (Arnett DK, et al., 2019) failed to calculate for the following reasons:   The 2019 ASCVD risk score is only valid for ages 47 to 31   The patient has a prior MI or stroke diagnosis    Other:  DEXA 03/05/2014             AP LUMBAR SPINE not used for calculation due to significant degenerative changes.             Left FOREARM (1/3 RADIUS) T Score: -0.4             Left FEMUR neck T-Score: -0.7     Social History   Tobacco Use  Smoking Status Former   Types: Cigarettes   Quit date: 04/24/2000   Years since quitting: 21.6   Passive exposure: Past  Smokeless Tobacco Never   BP Readings from Last 3 Encounters:  11/06/21 131/61  09/17/21 130/70  09/14/21 (!) 110/57   Pulse Readings from Last 3 Encounters:  11/06/21 (!) 43  09/17/21 63  09/14/21 74   Wt Readings from Last 3 Encounters:  11/06/21 188 lb (85.3 kg)  09/17/21 189 lb 12.8 oz (86.1 kg)  09/14/21 190 lb (86.2 kg)    Assessment: Review of patient past medical history, allergies, medications, health status, including review of consultants reports, laboratory and other test data, was  performed as part of comprehensive evaluation and provision of chronic care management services.   SDOH:  (Social Determinants of Health) assessments and interventions performed:  SDOH Interventions    Flowsheet Row Chronic Care Management from 10/07/2021 in [facility] at [place] Management from 05/13/2021 in  Archivist at [employer] from 05/08/2021 in [place] [facility] [place] Management from 03/25/2021 in [facility] Telephone from 03/09/2021 in [place] Patient Outreach Telephone from 03/06/2021 in [place] Interventions        Food Insecurity Interventions -- Intervention Not Indicated, Other (Comment)  [Working with [employer] to [employer] Food/Nutrition Resources] [employer] Referral  [employer] on Wheels] -- -- --  Housing Interventions Intervention Not Indicated Intervention Not Indicated -- -- -- --  Transportation Interventions -- Intervention Not Indicated, Other (Comment)  [Working with [employer] to [employer] and Resources] -- -- Other (Comment)  [Patient outreached and going to mail her information on [place] transportation and also on [place] transportaion] --  [referral sent to community care guide for transportation assistance]  Depression Interventions/Treatment  -- Referral to Psychiatry, Medication, Counseling -- -- -- Medication  Financial Strain Interventions Intervention Not Indicated Intervention Not Indicated -- -- -- --  Physical Activity Interventions -- Patient Refused -- Patient Refused -- --  Stress Interventions -- [employer], Provide Counseling, Other (Comment)  Financial planner for Counseling Services] -- -- -- --  Social Connections Interventions -- Patient Refused  -- -- -- --          CCM Care Plan  Allergies  Allergen Reactions   Ibuprofen Other (See Comments)    Pt has hx of peptic ulcer and diverticulitis.     Medications Reviewed Today     Reviewed by Cherre Robins, RPH-CPP (Pharmacist) on 12/18/21 at 1350  Med List Status: <None>   Medication Order Taking? Sig Documenting Provider Last Dose Status Informant  albuterol (PROVENTIL) (2.5 MG/3ML) 0.083% nebulizer solution [id number] Yes USE 1 VIAL IN NEBULIZER EVERY 6 HOURS AS NEEDED FOR WHEEZING AND FOR SHORTNESS OF Thayer Headings, NP Taking Active   albuterol (VENTOLIN HFA) 108 (90 Base) MCG/ACT inhaler [id number] Yes Inhale 2 puffs into the lungs every 6 (six) hours as needed for wheezing or shortness of breath. Debbrah Alar, NP Taking Active   allopurinol (ZYLOPRIM) 100 MG tablet [id number] Yes TAKE TWO TABLETS BY MOUTH ONCE DAILY Debbrah Alar, NP Taking Active   aspirin 81 MG EC tablet [id number] Yes Take 1 tablet (81 mg total) by mouth daily. Swallow whole. Berniece Salines, DO Taking Active            Med Note Antony Contras, Ajani Rineer B   Wed Oct 07, 2021 11:19 AM)    atorvastatin (LIPITOR) 80 MG tablet [id number] Yes TAKE ONE TABLET BY MOUTH EVERYDAY AT BEDTIME Tobb, Kardie, DO Taking Active   chlorhexidine (PERIDEX) 0.12 % solution [id number] Yes SMARTSIG:0.5 Ounce(s) By Mouth Twice Daily [provider] Taking Active   colchicine 0.6 MG tablet [id number] No Take 0.6 mg by mouth daily as needed (gout).  Patient not taking: Reported on 12/18/2021   [provider] Not Taking Active   diazepam (VALIUM) 10 MG tablet [id number] Yes TAKE ONE TABLET BY MOUTH EVERY TWELVE HOURS AS NEEDED FOR ANXIETY Debbrah Alar, NP Taking Active   dicyclomine (BENTYL) 10 MG capsule [id number] No Take 1 capsule (10 mg total) by mouth in the morning, at noon, in the evening, and at bedtime.  Patient not taking: Reported on 12/18/2021   Debbrah Alar, NP Not Taking Active    escitalopram (LEXAPRO) 20 MG tablet [id number] Yes TAKE ONE TABLET  BY MOUTH EVERY MORNING Debbrah Alar, NP Taking Active   ezetimibe (ZETIA) 10 MG tablet [id number] Yes TAKE ONE TABLET BY MOUTH ONCE DAILY Tobb, Kardie, DO Taking Active   famotidine (PEPCID) 20 MG tablet [id number] Yes TAKE ONE TABLET BY MOUTH EVERY MORNING and TAKE ONE TABLET BY MOUTH EVERYDAY AT BEDTIME Debbrah Alar, NP Taking Active   isosorbide mononitrate (IMDUR) 60 MG 24 hr tablet [id number] Yes Take 1 tablet (60 mg total) by mouth daily. Berniece Salines, DO Taking Active   levothyroxine (SYNTHROID) 25 MCG tablet [id number] Yes TAKE ONE TABLET BY MOUTH EVERY MORNING Debbrah Alar, NP Taking Active   lisinopril (ZESTRIL) 2.5 MG tablet [id number] Yes TAKE ONE TABLET BY MOUTH EVERYDAY AT BEDTIME Tobb, Kardie, DO Taking Active   meclizine (ANTIVERT) 25 MG tablet [id number] Yes TAKE ONE TABLET BY MOUTH twice daily AS NEEDED FOR dizziness Debbrah Alar, NP Taking Active   nitroGLYCERIN (NITROSTAT) 0.4 MG SL tablet [id number] Yes Place 1 tablet (0.4 mg total) under the tongue every 5 (five) minutes as needed for chest pain. Berniece Salines, DO Taking Active   nystatin (MYCOSTATIN/NYSTOP) powder [id number] No APPLY powder TOPICALLY TO THE AFFECTED AREA(S) TWICE DAILY  Patient not taking: Reported on 12/18/2021   Debbrah Alar, NP Not Taking Active   pantoprazole (PROTONIX) 40 MG tablet [id number] Yes TAKE ONE TABLET BY MOUTH TWICE DAILY Debbrah Alar, NP Taking Active   potassium chloride SA (KLOR-CON M) 20 MEQ tablet [id number] Yes TAKE ONE TABLET BY MOUTH AT Talbert Nan, NP Taking Active   Respiratory Therapy Supplies (NEBULIZER/TUBING/MOUTHPIECE) KIT [id number] Yes Use as directed Debbrah Alar, NP Taking Active   sucralfate (CARAFATE) 1 g tablet [id number] Yes TAKE ONE TABLET BY MOUTH FOUR TIMES DAILY. MAY cut pill in half AND DISSOLVE in water prior TO drinking Debbrah Alar, NP Taking  Active             Patient Active Problem List   Diagnosis Date Noted   Pulmonary hypertension, unspecified ([place]) 09/17/2021   Morbid obesity ([place]) 09/17/2021   Acute vaginitis 09/14/2021   Atypical chest pain 08/26/2021   Memory loss 08/26/2021   Cough 04/21/2021   Back pain 12/03/2020   Acute cystitis without hematuria 12/03/2020   Grief reaction 12/03/2020   Fatty liver 12/01/2020   Intermittent left-sided chest pain 09/17/2020   Coronary artery disease involving native coronary artery of native heart 35/36/1443   Metabolic syndrome 15/40/0867   Prediabetes 09/17/2020   Allergy 09/16/2020   Anxiety 09/16/2020   Hypertension 09/16/2020   Vertigo 09/16/2020   Dark stools 09/08/2020   Dysphagia    Esophageal stricture    Hiatal hernia    Epigastric pain    Hypothyroid 01/25/2018   Mild CAD 10/03/2017   Stress-induced cardiomyopathy 09/19/2017   COPD (chronic obstructive pulmonary disease) ([place]) 09/14/2017   Hypokalemia 09/14/2017   Non-ST elevation (NSTEMI) myocardial infarction [facility]) - due to Demand Infarction 09/14/2017   CKD (chronic kidney disease) 04/04/2017   HLD (hyperlipidemia)    Allergic rhinitis    Gout 02/28/2017   Depression with anxiety 02/28/2017   Epigastric abdominal pain 12/22/2016   COPD with chronic bronchitis ([place]) 12/22/2016   GERD (gastroesophageal reflux disease) 12/22/2016   DOE (dyspnea on exertion) 12/22/2016   Peptic ulcer 01/04/2003    Immunization History  Administered Date(s) Administered   Fluad Quad(high Dose 65+) 03/14/2020, 12/03/2020   Influenza, High Dose Seasonal PF 01/23/2018, 12/07/2018   Influenza-Unspecified 12/23/2018   PFIZER(Purple Top)SARS-COV-2 Vaccination 05/14/2019, 06/08/2019  Pneumococcal Conjugate-13 07/16/2015   Pneumococcal Polysaccharide-23 12/23/2016   Tdap 09/02/2009, 04/05/2017    Conditions to be addressed/monitored: CAD, HTN, HLD, COPD, Anxiety, Depression and gout, GERD ;  hypothyroidism  Care Plan : General Pharmacy (Adult)  Updates made by Cherre Robins, RPH-CPP since 12/18/2021 12:00 AM     Problem: Medication management and Monitoring and Coordination of Care for Chronic Conditions   Priority: High  Onset Date: 09/02/2020     Long-Range Goal: Medication management   Start Date: 09/02/2020  Recent Progress: On track  Priority: High  Note:    Current Barriers:  Unable to achieve control of hyperlipidemia  Does not adhere to prescribed medication regimen Does not maintain contact with provider office Sometimes forgets to take night time dose of medications - improving  Pharmacist Clinical Goal(s):  Over the next 90 days, patient will achieve adherence to monitoring guidelines and medication adherence to achieve therapeutic efficacy achieve control of hyperlipidemia as evidenced by LDL < 70 adhere to prescribed medication regimen as evidenced by filling prescriptions on times contact provider office for questions/concerns as evidenced notation of same in electronic health record through collaboration with PharmD and provider.   Interventions: 1:1 collaboration with Debbrah Alar, NP regarding development and update of comprehensive plan of care as evidenced by provider attestation and co-signature Inter-disciplinary care team collaboration (see longitudinal plan of care) Comprehensive medication review performed; medication list updated in electronic medical record  Hypertension: Controlled though blood pressure was a little low at last office visit BP goal is:  <130/80  Patient is not checking BP at home - states she does not have BP cuff.  Patient has failed these meds in the past: None noted  Current Regimen: Metoprolol Succinate 25mg  0.5 tab daily Lisinopril 2.5mg  daily Interventions:  Reviewed signs and symptoms of low blood pressure to monitor for.  Discussed blood pressure  goal Continue current medications for blood pressure    Hyperlipidemia / history of MI: Controlled; LDL goal < 70  Patient has failed these meds in past: None noted   In September 2022 patient reported she just stopped taking atorvastatin because she was taking so many other medications. She has been taking atrovastatin regularly since. LDL in May 2023 was 43 (previously was 125 in May of 2022)  Current therapy Atorvastatin 80mg  daily (increased 08/18/2020) Ezetimibe 10mg  daily (added 09/17/2020)  Metoprolol Succinate 25mg  1/2 tab daily Isosorbide ER 60mg  daily  Aspirin 81mg  daily  Nitroglycerin 0.4mg  - place 1 tablet under tongue as needed for chest pain, may repeat in 5 minutes if chest pain not relieved. Interventions: (addressed at previous visit) Discussed LDL goal Discussed importance of taking atorvastatin daily to both lower cholesterol and prevent ASCVD / recurrent MI; Adherence has improved since she started adherence packaging. Reminded patient to reschedule appointment with cardiologist.   Chronic Obstructive Pulmonary Disease: Controlled; last exacerbation due to upper respiratory infection 04/21/2021 Has not needed nebulizer treatment in the last month Current treatment: Albuterol solution for nebulizer - use in nebulizer up to every 6 hours as needed for shortness of breath Interventions:  Monitor exacerbations; Determine if escalation in therapy is needed.  Depression/Anxiety:  Improved; goal: decrease use of diazepam and decrease number of days per month with symptoms of grief / depression Current treatment: Escitalopram 20mg  daily Diazepam 10mg  every 12 hours as needed  Patient has failed these meds in past: citalopram (ineffective) Patient has been taking diazepam less recently. Only using 1 or 2 times per week when she  has difficulty sleeping.  Reports her mood has been stable the last 2 months. She still grieves the loss of her brother September 10, 2020 but states grief has improved over the last 1 to 2  months. Her  brother was like a father to her.  Interventions:  Discussed adherence and importance of taking escitalopram for depression / anxiety Encouraged patient to continue to use diazepam sparingly - only at night if needed.  Consider grief counseling (patient continues to decline). Patient will continue consults with Care Coordination LCSW  Hypothyroidism:  Patient is currently controlled on the following medications:  levothyroxine 69mcg daily  TSH was WNL at last check  Patient has failed these meds in past: None noted  Intervention:  Continue current therapy   Gout:   Goal Uric Acid < 6 mg/dL   Last uric acid was not at goal (UA = 8.4 on 03/14/2020) but patient denies symptoms of gout at this time.  Current medications:  Allopurinol 100mg  - take 2 tablets = 200mg  daily Colchicine 0.6mg  up to bid as needed for acute gout Medications that may increase uric acid levels: Aspirin Last gout flare: Feb 2022 Patient has failed these meds in past: None noted   Interventions:  Reviewed preventative versus treatment medications for gout.  Continue current therapy Consider checking Uric acid with next labs.    Medication management:   She started medication adherence packaging 01/08/2021. Current pharmacy: [facility] Intervention:  Comprehensive medication review completed.    Coordinated with [facility] regarding upcoming adherence packaging delivery. Assisted with putting medications in daily container to help take medications corrected until she starts next adherence packaging 11/08/2021 Education provided about how to order over-the-counter medication from [facility].  Discussed tips to help her remember to take nighttime medications more consistently - set alarm on cell phone, place medications be bedside or with toothbrush.    Patient Goals/Self-Care Activities Over the next 90 days, patient will:  take medications as prescribed focus on medication adherence by filing prescriptions at one  pharmacy and using adherence packaging Consider ordering blood pressure cuff with [employer] over-the-counter benefits.   Discussed tips to help her remember to take nighttime medications more consistently - set alarm on cell phone, place medications be bedside or with toothbrush.  These medications are NOT included in your packaging: '  - meclizine 25mg  - take 1 tablet twice a day as needed for dizziness - nitroglycerin 0.4mg  - place 1 tablet under the tongue and allow to dissolve if needed for chest pain. May repeat in 5 minutes. If still no relief of chest pain, call 911.   - diazepam 10mg  - take 1 tablet if needed for anxiety up to every 12 hours.   - dicyclomine 10mg  - take 1 capsules 4 times a day - morning, lunch, dinner and bedtime for abdominal pain.    Follow Up Plan: Telephone follow up appointment with care management team member scheduled for:  1 month           Medication Assistance: None required.  Patient affirms current coverage meets needs.  Patient's preferred pharmacy is:  [place], [place] [place] [address] Phone: [phone] Fax: [phone]  [facility] - [place], [place] - [address] [place] [address] Phone: [phone] Fax: [phone]   Follow Up:  Patient agrees to Care Plan and Follow-up.  Plan: phone follow up in 1  month.   Cherre Robins, PharmD Clinical Pharmacist [facility] [phone]

## 2021-12-18 NOTE — Patient Instructions (Addendum)
Veronica Wolf,  It was a pleasure speaking with you today Below is a summary of your health goals. You can also find an updated Chronic Care Management Care Plan at the end of this After Visit Summary.   Patient Goals/Self-Care Activities Over the next 90 days, patient will:  take medications as prescribed focus on medication adherence by filing prescriptions at one pharmacy and using adherence packaging Consider ordering blood pressure cuff with [employer] over-the-counter benefits.   Discussed tips to help her remember to take nighttime medications more consistently - set alarm on cell phone, place medications be bedside or with toothbrush.  These medications are NOT included in your packaging: '  - meclizine 25mg  - take 1 tablet twice a day as needed for dizziness - nitroglycerin 0.4mg  - place 1 tablet under the tongue and allow to dissolve if needed for chest pain. May repeat in 5 minutes. If still no relief of chest pain, call 911.   - diazepam 10mg  - take 1 tablet if needed for anxiety up to every 12 hours.   - dicyclomine 10mg  - take 1 capsules 4 times a day - morning, lunch, dinner and bedtime for abdominal pain.    If you have any questions or concerns, please feel free to contact me either at the phone number below or with a MyChart message.   Keep up the good work!  , PharmD Clinical Pharmacist [facility] [phone] (direct line)  [phone] (main office number)   The patient verbalized understanding of instructions, educational materials, and care plan provided today and DECLINED offer to receive copy of patient instructions, educational materials, and care plan.

## 2021-12-23 ENCOUNTER — Ambulatory Visit: Payer: Medicare PPO | Admitting: Family

## 2021-12-24 ENCOUNTER — Other Ambulatory Visit: Payer: Self-pay | Admitting: Family

## 2021-12-25 DIAGNOSIS — Z0184 Encounter for antibody response examination: Secondary | ICD-10-CM | POA: Diagnosis not present

## 2022-01-02 DIAGNOSIS — I10 Essential (primary) hypertension: Secondary | ICD-10-CM | POA: Diagnosis not present

## 2022-01-02 DIAGNOSIS — E782 Mixed hyperlipidemia: Secondary | ICD-10-CM

## 2022-01-02 DIAGNOSIS — E039 Hypothyroidism, unspecified: Secondary | ICD-10-CM | POA: Diagnosis not present

## 2022-01-02 DIAGNOSIS — F32A Depression, unspecified: Secondary | ICD-10-CM | POA: Diagnosis not present

## 2022-01-18 ENCOUNTER — Other Ambulatory Visit: Payer: Self-pay | Admitting: Family

## 2022-01-18 NOTE — Telephone Encounter (Signed)
Requesting: diazepam 10mg   Contract: 03/14/20 UDS: 03/05/21 Last Visit: 09/14/21 Next Visit: None Last Refill: 12/24/21 #45 and 0RF   Please Advise

## 2022-01-21 ENCOUNTER — Encounter: Payer: Self-pay | Admitting: Cardiology

## 2022-01-21 ENCOUNTER — Ambulatory Visit: Payer: Medicare PPO | Attending: Cardiology | Admitting: Cardiology

## 2022-01-21 VITALS — BP 126/78 | HR 69 | Ht 64.0 in | Wt 191.4 lb

## 2022-01-21 DIAGNOSIS — Z01810 Encounter for preprocedural cardiovascular examination: Secondary | ICD-10-CM

## 2022-01-21 DIAGNOSIS — E8881 Metabolic syndrome: Secondary | ICD-10-CM | POA: Diagnosis not present

## 2022-01-21 DIAGNOSIS — I25118 Atherosclerotic heart disease of native coronary artery with other forms of angina pectoris: Secondary | ICD-10-CM | POA: Diagnosis not present

## 2022-01-21 DIAGNOSIS — E669 Obesity, unspecified: Secondary | ICD-10-CM | POA: Diagnosis not present

## 2022-01-21 DIAGNOSIS — I10 Essential (primary) hypertension: Secondary | ICD-10-CM | POA: Diagnosis not present

## 2022-01-21 DIAGNOSIS — I2510 Atherosclerotic heart disease of native coronary artery without angina pectoris: Secondary | ICD-10-CM

## 2022-01-21 NOTE — H&P (View-Only) (Signed)
Cardiology Office Note:    Date:  01/21/2022   ID:  Veronica Wolf, DOB 08/01/1940, MRN [id number]  PCP:  O'Sullivan, Melissa, NP  Cardiologist:  Aadhira Heffernan, DO  Electrophysiologist:  None   Referring MD: O'Sullivan, Melissa, NP   " I am having chest pain"  History of Present Illness:    Veronica Wolf is a 81 y.o. female with a hx of of coronary artery disease and coronary angiography revealed mild coronary artery disease in 2019, Mixed hyperlipidemia, CKD, hypertension.   I saw the patient on 09/17/2020 at that she was experiencing chest pain.During that visit I recommended a nuclear stress. She was not able to get this testing done.  At her last visit she was experiencing was experiencing chest discomfort.  I discussed with the patient about getting further testing but she had declined.  Therefore started her on antianginals with Imdur.  She still is experiencing chest pain.  Today she tells me that she is still experiencing mid-sternal chest pain. No other complaints at this time.   Past Medical History:  Diagnosis Date   Allergic rhinitis    Allergy    seasonal allergies   Anxiety    CKD (chronic kidney disease) 04/04/2017   COPD (chronic obstructive pulmonary disease) (HCC)    uses inhaler   Depression    Fatty liver    GERD (gastroesophageal reflux disease)    Gout    hx of   Hyperlipidemia    on meds   Hypertension    on meds   Myocardial infarction (HCC)    Peptic ulcer 01/04/2003   Vertigo     Past Surgical History:  Procedure Laterality Date   ABDOMINAL HYSTERECTOMY     APPENDECTOMY     BIOPSY  08/13/2020   Procedure: BIOPSY;  Surgeon: Cirigliano, Vito V, DO;  Location: [facility];  Service: Gastroenterology;;   ESOPHAGOGASTRODUODENOSCOPY (EGD) WITH PROPOFOL N/A 08/13/2020   Procedure: ESOPHAGOGASTRODUODENOSCOPY (EGD) WITH PROPOFOL;  Surgeon: Cirigliano, Vito V, DO;  Location: [facility];  Service: Gastroenterology;  Laterality: N/A;   LEFT HEART CATH  AND CORONARY ANGIOGRAPHY N/A 06/13/2017   Procedure: LEFT HEART CATH AND CORONARY ANGIOGRAPHY;  Surgeon: Harding, David W, MD;  Location: [facility];  Service: Cardiovascular;  Laterality: N/A;   RIGHT/LEFT HEART CATH AND CORONARY ANGIOGRAPHY N/A 09/16/2017   Procedure: RIGHT/LEFT HEART CATH AND CORONARY ANGIOGRAPHY;  Surgeon: Jordan, Peter M, MD;  Location: [facility];  Service: Cardiovascular;  Laterality: N/A;   SAVORY DILATION N/A 08/13/2020   Procedure: SAVORY DILATION;  Surgeon: Cirigliano, Vito V, DO;  Location: [facility];  Service: Gastroenterology;  Laterality: N/A;   ULTRASOUND GUIDANCE FOR VASCULAR ACCESS  09/16/2017   Procedure: Ultrasound Guidance For Vascular Access;  Surgeon: Jordan, Peter M, MD;  Location: [facility];  Service: Cardiovascular;;   WISDOM TOOTH EXTRACTION      Current Medications: Current Meds  Medication Sig   albuterol (PROVENTIL) (2.5 MG/3ML) 0.083% nebulizer solution USE 1 VIAL IN NEBULIZER EVERY 6 HOURS AS NEEDED FOR WHEEZING AND FOR SHORTNESS OF BREATH   albuterol (VENTOLIN HFA) 108 (90 Base) MCG/ACT inhaler Inhale 2 puffs into the lungs every 6 (six) hours as needed for wheezing or shortness of breath.   allopurinol (ZYLOPRIM) 100 MG tablet TAKE TWO TABLETS BY MOUTH ONCE DAILY   aspirin 81 MG EC tablet Take 1 tablet (81 mg total) by mouth daily. Swallow whole.   atorvastatin (LIPITOR) 80 MG tablet TAKE ONE   TABLET BY MOUTH EVERYDAY AT BEDTIME   chlorhexidine (PERIDEX) 0.12 % solution SMARTSIG:0.5 Ounce(s) By Mouth Twice Daily   colchicine 0.6 MG tablet Take 0.6 mg by mouth daily as needed (gout).   diazepam (VALIUM) 10 MG tablet TAKE ONE TABLET BY MOUTH EVERY TWELVE HOURS AS NEEDED FOR ANXIETY   dicyclomine (BENTYL) 10 MG capsule Take 1 capsule (10 mg total) by mouth in the morning, at noon, in the evening, and at bedtime.   escitalopram (LEXAPRO) 20 MG tablet TAKE ONE TABLET BY MOUTH EVERY MORNING   ezetimibe (ZETIA) 10 MG tablet  TAKE ONE TABLET BY MOUTH ONCE DAILY   famotidine (PEPCID) 20 MG tablet TAKE ONE TABLET BY MOUTH EVERY MORNING and TAKE ONE TABLET BY MOUTH EVERYDAY AT BEDTIME   isosorbide mononitrate (IMDUR) 60 MG 24 hr tablet Take 1 tablet (60 mg total) by mouth daily.   levothyroxine (SYNTHROID) 25 MCG tablet TAKE ONE TABLET BY MOUTH EVERY MORNING   lisinopril (ZESTRIL) 2.5 MG tablet TAKE ONE TABLET BY MOUTH EVERYDAY AT BEDTIME   meclizine (ANTIVERT) 25 MG tablet TAKE ONE TABLET BY MOUTH twice daily AS NEEDED FOR dizziness   metoprolol succinate (TOPROL-XL) 25 MG 24 hr tablet Take 0.5 tablets by mouth daily.   nitroGLYCERIN (NITROSTAT) 0.4 MG SL tablet Place 1 tablet (0.4 mg total) under the tongue every 5 (five) minutes as needed for chest pain.   nystatin (MYCOSTATIN/NYSTOP) powder APPLY powder TOPICALLY TO THE AFFECTED AREA(S) TWICE DAILY   pantoprazole (PROTONIX) 40 MG tablet TAKE ONE TABLET BY MOUTH TWICE DAILY   potassium chloride SA (KLOR-CON M) 20 MEQ tablet TAKE ONE TABLET BY MOUTH AT NOON   Respiratory Therapy Supplies (NEBULIZER/TUBING/MOUTHPIECE) KIT Use as directed   sucralfate (CARAFATE) 1 g tablet TAKE ONE TABLET BY MOUTH FOUR TIMES DAILY. MAY cut pill in half AND DISSOLVE in water prior TO drinking     Allergies:   Ibuprofen   Social History   Socioeconomic History   Marital status: Widowed    Spouse name: Not on file   Number of children: 1   Years of education: 12   Highest education level: 12th grade  Occupational History   Occupation: Retired  Tobacco Use   Smoking status: Former    Types: Cigarettes    Quit date: 04/24/2000    Years since quitting: 21.7    Passive exposure: Past   Smokeless tobacco: Never  Vaping Use   Vaping Use: Never used  Substance and Sexual Activity   Alcohol use: Not Currently    Comment: has previous hx of ETOH abuse, quit 2014   Drug use: No   Sexual activity: Not Currently  Other Topics Concern   Not on file  Social History Narrative    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 [place]   Grew up in [place]   Has daughter locally   Social Determinants of Health   Financial Resource Strain: Low Risk  (10/07/2021)   Overall Financial Resource Strain (CARDIA)    Difficulty of Paying Living Expenses: Not very hard  Food Insecurity: No Food Insecurity (05/13/2021)   Hunger Vital Sign    Worried About Running Out of Food in the Last Year: Never true    Ran Out of Food in the Last Year: Never true  Transportation Needs: No Transportation Needs (05/13/2021)   PRAPARE - Transportation    Lack of Transportation (Medical): No    Lack of Transportation (Non-Medical): No  Recent Concern: Transportation Needs - Unmet Transportation   Needs (03/09/2021)   PRAPARE - Transportation    Lack of Transportation (Medical): Yes    Lack of Transportation (Non-Medical): Yes  Physical Activity: Inactive (05/13/2021)   Exercise Vital Sign    Days of Exercise per Week: 0 days    Minutes of Exercise per Session: 0 min  Stress: Stress Concern Present (05/13/2021)   [employer] - Occupational Stress Questionnaire    Feeling of Stress : Rather much  Social Connections: Moderately Isolated (05/13/2021)   Social Connection and Isolation Panel [NHANES]    Frequency of Communication with Friends and Family: More than three times a week    Frequency of Social Gatherings with Friends and Family: More than three times a week    Attends Religious Services: 1 to 4 times per year    Active Member of Clubs or Organizations: No    Attends Club or Organization Meetings: Never    Marital Status: Widowed     Family History: The patient's family history includes Breast cancer in her mother; Stroke in her father. There is no history of Stomach cancer, Colon cancer, Pancreatic cancer, Esophageal cancer, Colon polyps, or Rectal cancer.  ROS:   Review of Systems  Constitution: Negative for decreased appetite, fever and weight gain.  HENT: Negative  for congestion, ear discharge, hoarse voice and sore throat.   Eyes: Negative for discharge, redness, vision loss in right eye and visual halos.  Cardiovascular: Negative for chest pain, dyspnea on exertion, leg swelling, orthopnea and palpitations.  Respiratory: Negative for cough, hemoptysis, shortness of breath and snoring.   Endocrine: Negative for heat intolerance and polyphagia.  Hematologic/Lymphatic: Negative for bleeding problem. Does not bruise/bleed easily.  Skin: Negative for flushing, nail changes, rash and suspicious lesions.  Musculoskeletal: Negative for arthritis, joint pain, muscle cramps, myalgias, neck pain and stiffness.  Gastrointestinal: Negative for abdominal pain, bowel incontinence, diarrhea and excessive appetite.  Genitourinary: Negative for decreased libido, genital sores and incomplete emptying.  Neurological: Negative for brief paralysis, focal weakness, headaches and loss of balance.  Psychiatric/Behavioral: Negative for altered mental status, depression and suicidal ideas.  Allergic/Immunologic: Negative for HIV exposure and persistent infections.    EKGs/Labs/Other Studies Reviewed:    The following studies were reviewed today:   EKG:  None today   LHC 2019 Prox RCA lesion is 20% stenosed. Post Atrio lesion is 20% stenosed. Hemodynamic findings consistent with moderate pulmonary hypertension. LV end diastolic pressure is moderately elevated.   1. No significant CAD 2. Moderate to severely elevated LV filling pressures 3. Moderate pulmonary venous hypertension.  4. Normal cardiac output.  Plan: recommend IV diuresis. I suspect elevated troponin is due to demand ischemia in setting of decompensated heart failure.    TTE 2019  Study Conclusions  - Left ventricle: The cavity size was normal. Wall thickness was   normal. Systolic function was normal. The estimated ejection fraction was in the range of 55% to 60%. Mild hypokinesis of the     mid-apicallateral and inferolateral myocardium. Doppler   parameters are consistent with abnormal left ventricular   relaxation (grade 1 diastolic dysfunction).  - Pulmonary arteries: Systolic pressure was moderately increased.    PA peak pressure: 62 mm Hg (S).   Recent Labs: 08/26/2021: TSH 4.80 11/06/2021: ALT 10; BUN 12; Creatinine, Ser 1.45; Hemoglobin 11.7; Platelets 213; Potassium 4.6; Sodium 138  Recent Lipid Panel    Component Value Date/Time   CHOL 127 08/26/2021 0905   TRIG 135.0 08/26/2021 0905     HDL 43.10 08/26/2021 0905   CHOLHDL 3 08/26/2021 0905   VLDL 27.0 08/26/2021 0905   LDLCALC 57 08/26/2021 0905   LDLCALC 108 (H) 03/14/2020 1439    Physical Exam:    VS:  BP 126/78   Pulse 69   Ht 5' 4" (1.626 m)   Wt 191 lb 6.4 oz (86.8 kg)   SpO2 93%   BMI 32.85 kg/m     Wt Readings from Last 3 Encounters:  01/21/22 191 lb 6.4 oz (86.8 kg)  11/06/21 188 lb (85.3 kg)  09/17/21 189 lb 12.8 oz (86.1 kg)     GEN: Well nourished, well developed in no acute distress HEENT: Normal NECK: No JVD; No carotid bruits LYMPHATICS: No lymphadenopathy CARDIAC: S1S2 noted,RRR, no murmurs, rubs, gallops RESPIRATORY:  Clear to auscultation without rales, wheezing or rhonchi  ABDOMEN: Soft, non-tender, non-distended, +bowel sounds, no guarding. EXTREMITIES: No edema, No cyanosis, no clubbing MUSCULOSKELETAL:  No deformity  SKIN: Warm and dry NEUROLOGIC:  Alert and oriented x 3, non-focal PSYCHIATRIC:  Normal affect, good insight  ASSESSMENT:    1. Preprocedural cardiovascular examination   2. Mild CAD   3. Primary hypertension   4. Coronary artery disease of native artery of native heart with stable angina pectoris (HCC)   5. Metabolic syndrome   6. Obesity (BMI 30-39.9)    PLAN:    She still experiencing chest discomfort-increasing her Imdur has not improved.  She is on aspirin and statin.  I think at this point it will be best to move forward with getting a heart  catheterization in this patient.  She is agreeable. The patient understands that risks include but are not limited to stroke (1 in 1000), death (1 in 1000), kidney failure [usually temporary] (1 in 500), bleeding (1 in 200), allergic reaction [possibly serious] (1 in 200), and agrees to proceed.  Blood pressure at target.   HLN- continue her statin.  Obesity- continue with lifestyle modification. .   The patient is in agreement with the above plan. The patient left the office in stable condition.  The patient will follow up in 4 weeks post cath.   Medication Adjustments/Labs and Tests Ordered: Current medicines are reviewed at length with the patient today.  Concerns regarding medicines are outlined above.  Orders Placed This Encounter  Procedures   CBC   Basic Metabolic Panel (BMET)   No orders of the defined types were placed in this encounter.   Patient Instructions  Medication Instructions:  Your physician recommends that you continue on your current medications as directed. Please refer to the Current Medication list given to you today.  *If you need a refill on your cardiac medications before your next appointment, please call your pharmacy*   Lab Work: Your physician recommends that you have the following labs drawn today: CBC and BMP  If you have labs (blood work) drawn today and your tests are completely normal, you will receive your results only by: MyChart Message (if you have MyChart) OR A paper copy in the mail If you have any lab test that is abnormal or we need to change your treatment, we will call you to review the results.   Testing/Procedures: Your physician has requested that you have a cardiac catheterization. Cardiac catheterization is used to diagnose and/or treat various heart conditions. Doctors may recommend this procedure for a number of different reasons. The most common reason is to evaluate chest pain. Chest pain can be a symptom of coronary artery    disease (CAD), and cardiac catheterization can show whether plaque is narrowing or blocking your heart's arteries. This procedure is also used to evaluate the valves, as well as measure the blood flow and oxygen levels in different parts of your heart. For further information please visit www.[email or web address]. Please follow instruction sheet, as given.    Follow-Up: At [facility], you and your health needs are our priority.  As part of our continuing mission to provide you with exceptional heart care, we have created designated Provider Care Teams.  These Care Teams include your primary Cardiologist (physician) and Advanced Practice Providers (APPs -  Physician Assistants and Nurse Practitioners) who all work together to provide you with the care you need, when you need it.  We recommend signing up for the patient portal called "MyChart".  Sign up information is provided on this After Visit Summary.  MyChart is used to connect with patients for Virtual Visits (Telemedicine).  Patients are able to view lab/test results, encounter notes, upcoming appointments, etc.  Non-urgent messages can be sent to your provider as well.   To learn more about what you can do with MyChart, go to [email or web address].    Your next appointment:   4 week(s) POST CATH   The format for your next appointment:   In Person  Provider:   Zurisadai Helminiak, DO     Other Instructions  [facility] OF [facility] [address] A DEPT OF [place]. [facility] [id number] [place] [address] Dept: [phone] Loc: [phone]  Shawndell P White  01/21/2022  You are scheduled for a Cardiac Catheterization on Wednesday, October 25 with Dr. Arun Thukkani.  1. Please arrive at the [facility] (Main Entrance A) at [facility]: [address] [place], [place] [address] at 10:30 AM (This time is two hours before your  procedure to ensure your preparation). Free valet parking service is available.   Special note: Every effort is made to have your procedure done on time. Please understand that emergencies sometimes delay scheduled procedures.  2. Diet: Do not eat solid foods after midnight.  The patient may have clear liquids until 5am upon the day of the procedure.  3. Labs drawn today In office  4. Medication instructions in preparation for your procedure:   Contrast Allergy: No  On the morning of your procedure, take your Aspirin 81 mg and any morning medicines NOT listed above.  You may use sips of water.  5. Plan for one night stay--bring personal belongings. 6. Bring a current list of your medications and current insurance cards. 7. You MUST have a responsible person to drive you home. 8. Someone MUST be with you the first 24 hours after you arrive home or your discharge will be delayed. 9. Please wear clothes that are easy to get on and off and wear slip-on shoes.  Thank you for allowing us to care for you!   -- [facility]    Important Information About Sugar         Adopting a Healthy Lifestyle.  Know what a healthy weight is for you (roughly BMI <25) and aim to maintain this   Aim for 7+ servings of fruits and vegetables daily   65-80+ fluid ounces of water or unsweet tea for healthy kidneys   Limit to max 1 drink of alcohol per day; avoid smoking/tobacco   Limit animal fats   in diet for cholesterol and heart health - choose grass fed whenever available   Avoid highly processed foods, and foods high in saturated/trans fats   Aim for low stress - take time to unwind and care for your mental health   Aim for 150 min of moderate intensity exercise weekly for heart health, and weights twice weekly for bone health   Aim for 7-9 hours of sleep daily   When it comes to diets, agreement about the perfect plan isnt easy to find, even among the  experts. Experts at the [employer] developed an idea known as the Healthy Eating Plate. Just imagine a plate divided into logical, healthy portions.   The emphasis is on diet quality:   Load up on vegetables and fruits - one-half of your plate: Aim for color and variety, and remember that potatoes dont count.   Go for whole grains - one-quarter of your plate: Whole wheat, barley, wheat berries, quinoa, oats, brown rice, and foods made with them. If you want pasta, go with whole wheat pasta.   Protein power - one-quarter of your plate: Fish, chicken, beans, and nuts are all healthy, versatile protein sources. Limit red meat.   The diet, however, does go beyond the plate, offering a few other suggestions.   Use healthy plant oils, such as olive, canola, soy, corn, sunflower and peanut. Check the labels, and avoid partially hydrogenated oil, which have unhealthy trans fats.   If youre thirsty, drink water. Coffee and tea are good in moderation, but skip sugary drinks and limit milk and dairy products to one or two daily servings.   The type of carbohydrate in the diet is more important than the amount. Some sources of carbohydrates, such as vegetables, fruits, whole grains, and beans-are healthier than others.   Finally, stay active  Signed, Coleson Kant, DO  01/21/2022 9:52 PM    [facility] 

## 2022-01-21 NOTE — Progress Notes (Signed)
Cardiology Office Note:    Date:  01/21/2022   ID:  Veronica Wolf, DOB 1940/09/26, MRN [id number]  PCP:  Debbrah Alar, NP  Cardiologist:  Berniece Salines, DO  Electrophysiologist:  None   Referring MD: Debbrah Alar, NP   " I am having chest pain"  History of Present Illness:    Veronica Wolf is a 81 y.o. female with a hx of of coronary artery disease and coronary angiography revealed mild coronary artery disease in 2019, Mixed hyperlipidemia, CKD, hypertension.   I saw the patient on 09/17/2020 at that she was experiencing chest pain.During that visit I recommended a nuclear stress. She was not able to get this testing done.  At her last visit she was experiencing was experiencing chest discomfort.  I discussed with the patient about getting further testing but she had declined.  Therefore started her on antianginals with Imdur.  She still is experiencing chest pain.  Today she tells me that she is still experiencing mid-sternal chest pain. No other complaints at this time.   Past Medical History:  Diagnosis Date   Allergic rhinitis    Allergy    seasonal allergies   Anxiety    CKD (chronic kidney disease) 04/04/2017   COPD (chronic obstructive pulmonary disease) (HCC)    uses inhaler   Depression    Fatty liver    GERD (gastroesophageal reflux disease)    Gout    hx of   Hyperlipidemia    on meds   Hypertension    on meds   Myocardial infarction [facility])    Peptic ulcer 01/04/2003   Vertigo     Past Surgical History:  Procedure Laterality Date   ABDOMINAL HYSTERECTOMY     APPENDECTOMY     BIOPSY  08/13/2020   Procedure: BIOPSY;  Surgeon: Lavena Bullion, DO;  Location: [facility];  Service: Gastroenterology;;   ESOPHAGOGASTRODUODENOSCOPY (EGD) WITH PROPOFOL N/A 08/13/2020   Procedure: ESOPHAGOGASTRODUODENOSCOPY (EGD) WITH PROPOFOL;  Surgeon: Lavena Bullion, DO;  [facility]: [facility];  Service: Gastroenterology;  Laterality: N/A;   LEFT HEART CATH  AND CORONARY ANGIOGRAPHY N/A 06/13/2017   Procedure: LEFT HEART CATH AND CORONARY ANGIOGRAPHY;  Surgeon: Leonie Man, MD;  Location: [place] [facility];  Service: Cardiovascular;  Laterality: N/A;   RIGHT/LEFT HEART CATH AND CORONARY ANGIOGRAPHY N/A 09/16/2017   Procedure: RIGHT/LEFT HEART CATH AND CORONARY ANGIOGRAPHY;  Surgeon: Martinique, Peter M, MD;  Location: [place] [facility];  Service: Cardiovascular;  Laterality: N/A;   SAVORY DILATION N/A 08/13/2020   Procedure: SAVORY DILATION;  Surgeon: Lavena Bullion, DO;  [facility];  Service: Gastroenterology;  Laterality: N/A;   ULTRASOUND GUIDANCE FOR VASCULAR ACCESS  09/16/2017   Procedure: Ultrasound Guidance For Vascular Access;  Surgeon: Martinique, Peter M, MD;  Location: [place] [facility];  Service: Cardiovascular;;   WISDOM TOOTH EXTRACTION      Current Medications: Current Meds  Medication Sig   albuterol (PROVENTIL) (2.5 MG/3ML) 0.083% nebulizer solution USE 1 VIAL IN NEBULIZER EVERY 6 HOURS AS NEEDED FOR WHEEZING AND FOR SHORTNESS OF BREATH   albuterol (VENTOLIN HFA) 108 (90 Base) MCG/ACT inhaler Inhale 2 puffs into the lungs every 6 (six) hours as needed for wheezing or shortness of breath.   allopurinol (ZYLOPRIM) 100 MG tablet TAKE TWO TABLETS BY MOUTH ONCE DAILY   aspirin 81 MG EC tablet Take 1 tablet (81 mg total) by mouth daily. Swallow whole.   atorvastatin (LIPITOR) 80 MG tablet TAKE ONE  TABLET BY MOUTH EVERYDAY AT BEDTIME   chlorhexidine (PERIDEX) 0.12 % solution SMARTSIG:0.5 Ounce(s) By Mouth Twice Daily   colchicine 0.6 MG tablet Take 0.6 mg by mouth daily as needed (gout).   diazepam (VALIUM) 10 MG tablet TAKE ONE TABLET BY MOUTH EVERY TWELVE HOURS AS NEEDED FOR ANXIETY   dicyclomine (BENTYL) 10 MG capsule Take 1 capsule (10 mg total) by mouth in the morning, at noon, in the evening, and at bedtime.   escitalopram (LEXAPRO) 20 MG tablet TAKE ONE TABLET BY MOUTH EVERY MORNING   ezetimibe (ZETIA) 10 MG tablet  TAKE ONE TABLET BY MOUTH ONCE DAILY   famotidine (PEPCID) 20 MG tablet TAKE ONE TABLET BY MOUTH EVERY MORNING and TAKE ONE TABLET BY MOUTH EVERYDAY AT BEDTIME   isosorbide mononitrate (IMDUR) 60 MG 24 hr tablet Take 1 tablet (60 mg total) by mouth daily.   levothyroxine (SYNTHROID) 25 MCG tablet TAKE ONE TABLET BY MOUTH EVERY MORNING   lisinopril (ZESTRIL) 2.5 MG tablet TAKE ONE TABLET BY MOUTH EVERYDAY AT BEDTIME   meclizine (ANTIVERT) 25 MG tablet TAKE ONE TABLET BY MOUTH twice daily AS NEEDED FOR dizziness   metoprolol succinate (TOPROL-XL) 25 MG 24 hr tablet Take 0.5 tablets by mouth daily.   nitroGLYCERIN (NITROSTAT) 0.4 MG SL tablet Place 1 tablet (0.4 mg total) under the tongue every 5 (five) minutes as needed for chest pain.   nystatin (MYCOSTATIN/NYSTOP) powder APPLY powder TOPICALLY TO THE AFFECTED AREA(S) TWICE DAILY   pantoprazole (PROTONIX) 40 MG tablet TAKE ONE TABLET BY MOUTH TWICE DAILY   potassium chloride SA (KLOR-CON M) 20 MEQ tablet TAKE ONE TABLET BY MOUTH AT NOON   Respiratory Therapy Supplies (NEBULIZER/TUBING/MOUTHPIECE) KIT Use as directed   sucralfate (CARAFATE) 1 g tablet TAKE ONE TABLET BY MOUTH FOUR TIMES DAILY. MAY cut pill in half AND DISSOLVE in water prior TO drinking     Allergies:   Ibuprofen   Social History   Socioeconomic History   Marital status: Widowed    Spouse name: Not on file   Number of children: 1   Years of education: 108   Highest education level: 12th grade  Occupational History   Occupation: Retired  Tobacco Use   Smoking status: Former    Types: Cigarettes    Quit date: 04/24/2000    Years since quitting: 21.7    Passive exposure: Past   Smokeless tobacco: Never  Vaping Use   Vaping Use: Never used  Substance and Sexual Activity   Alcohol use: Not Currently    Comment: has previous hx of ETOH abuse, quit 2014   Drug use: No   Sexual activity: Not Currently  Other Topics Concern   Not on file  Social History Narrative    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[place]   Grew up in [place]   Has daughter locally   Social Determinants of Health   Financial Resource Strain: [place]  (10/07/2021)   Overall Financial Resource Strain (CARDIA)    Difficulty of Paying Living Expenses: Not very hard  Food Insecurity: No Food Insecurity (05/13/2021)   Hunger Vital Sign    Worried About Running Out of Food in the Last Year: Never true    [place] in the Last Year: Never true  Transportation Needs: No Transportation Needs (05/13/2021)   PRAPARE - Hydrologist (Medical): No    Lack of Transportation (Non-Medical): No  Recent Concern: Transportation Needs - Scientific laboratory technician  Needs (03/09/2021)   PRAPARE - Hydrologist (Medical): Yes    Lack of Transportation (Non-Medical): Yes  Physical Activity: Inactive (05/13/2021)   Exercise Vital Sign    Days of Exercise per Week: 0 days    Minutes of Exercise per Session: 0 min  Stress: Stress Concern Present (05/13/2021)   [place]    Feeling of Stress : Rather much  Social Connections: Moderately Isolated (05/13/2021)   Social Connection and Isolation Panel [NHANES]    Frequency of Communication with Friends and Family: More than three times a week    Frequency of Social Gatherings with Friends and Family: More than three times a week    Attends Religious Services: 1 to 4 times per year    Active Member of [employer] or Organizations: No    Attends Archivist Meetings: Never    Marital Status: Widowed     Family History: The patient's family history includes Breast cancer in her mother; Stroke in her father. There is no history of Stomach cancer, Colon cancer, Pancreatic cancer, Esophageal cancer, Colon polyps, or Rectal cancer.  ROS:   Review of Systems  Constitution: Negative for decreased appetite, fever and weight gain.  HENT: Negative  for congestion, ear discharge, hoarse voice and sore throat.   Eyes: Negative for discharge, redness, vision loss in right eye and visual halos.  Cardiovascular: Negative for chest pain, dyspnea on exertion, leg swelling, orthopnea and palpitations.  Respiratory: Negative for cough, hemoptysis, shortness of breath and snoring.   Endocrine: Negative for heat intolerance and polyphagia.  Hematologic/Lymphatic: Negative for bleeding problem. Does not bruise/bleed easily.  Skin: Negative for flushing, nail changes, rash and suspicious lesions.  Musculoskeletal: Negative for arthritis, joint pain, muscle cramps, myalgias, neck pain and stiffness.  Gastrointestinal: Negative for abdominal pain, bowel incontinence, diarrhea and excessive appetite.  Genitourinary: Negative for decreased libido, genital sores and incomplete emptying.  Neurological: Negative for brief paralysis, focal weakness, headaches and loss of balance.  Psychiatric/Behavioral: Negative for altered mental status, depression and suicidal ideas.  Allergic/Immunologic: Negative for HIV exposure and persistent infections.    EKGs/Labs/Other Studies Reviewed:    The following studies were reviewed today:   EKG:  None today   LHC 2019 Prox RCA lesion is 20% stenosed. Post Atrio lesion is 20% stenosed. Hemodynamic findings consistent with moderate pulmonary hypertension. LV end diastolic pressure is moderately elevated.   1. No significant CAD 2. Moderate to severely elevated LV filling pressures 3. Moderate pulmonary venous hypertension.  4. Normal cardiac output.  Plan: recommend IV diuresis. I suspect elevated troponin is due to demand ischemia in setting of decompensated heart failure.    TTE 2019  Study Conclusions  - Left ventricle: The cavity size was normal. Wall thickness was   normal. Systolic function was normal. The estimated ejection fraction was in the range of 55% to 60%. Mild hypokinesis of the     mid-apicallateral and inferolateral myocardium. Doppler   parameters are consistent with abnormal left ventricular   relaxation (grade 1 diastolic dysfunction).  - Pulmonary arteries: Systolic pressure was moderately increased.    PA peak pressure: 62 mm Hg (S).   Recent Labs: 08/26/2021: TSH 4.80 11/06/2021: ALT 10; BUN 12; Creatinine, Ser 1.45; Hemoglobin 11.7; Platelets 213; Potassium 4.6; Sodium 138  Recent Lipid Panel    Component Value Date/Time   CHOL 127 08/26/2021 0905   TRIG 135.0 08/26/2021 0905  HDL 43.10 08/26/2021 0905   CHOLHDL 3 08/26/2021 0905   VLDL 27.0 08/26/2021 0905   LDLCALC 57 08/26/2021 0905   LDLCALC 108 (H) 03/14/2020 1439    Physical Exam:    VS:  BP 126/78   Pulse 69   Ht 5' 4" (1.626 m)   Wt 191 lb 6.4 oz (86.8 kg)   SpO2 93%   BMI 32.85 kg/m     Wt Readings from Last 3 Encounters:  01/21/22 191 lb 6.4 oz (86.8 kg)  11/06/21 188 lb (85.3 kg)  09/17/21 189 lb 12.8 oz (86.1 kg)     GEN: Well nourished, well developed in no acute distress HEENT: Normal NECK: No JVD; No carotid bruits LYMPHATICS: No lymphadenopathy CARDIAC: S1S2 noted,RRR, no murmurs, rubs, gallops RESPIRATORY:  Clear to auscultation without rales, wheezing or rhonchi  ABDOMEN: Soft, non-tender, non-distended, +bowel sounds, no guarding. EXTREMITIES: No edema, No cyanosis, no clubbing MUSCULOSKELETAL:  No deformity  SKIN: Warm and dry NEUROLOGIC:  Alert and oriented x 3, non-focal PSYCHIATRIC:  Normal affect, good insight  ASSESSMENT:    1. Preprocedural cardiovascular examination   2. Mild CAD   3. Primary hypertension   4. Coronary artery disease of native artery of native heart with stable angina pectoris ([place])   5. Metabolic syndrome   6. Obesity (BMI 30-39.9)    PLAN:    She still experiencing chest discomfort-increasing her Imdur has not improved.  She is on aspirin and statin.  I think at this point it will be best to move forward with getting a heart  catheterization in this patient.  She is agreeable. The patient understands that risks include but are not limited to stroke (1 in 1000), death (1 in 74), kidney failure [usually temporary] (1 in 500), bleeding (1 in 200), allergic reaction [possibly serious] (1 in 200), and agrees to proceed.  Blood pressure at target.   HLN- continue her statin.  Obesity- continue with lifestyle modification. .   The patient is in agreement with the above plan. The patient left the office in stable condition.  The patient will follow up in 4 weeks post cath.   Medication Adjustments/Labs and Tests Ordered: Current medicines are reviewed at length with the patient today.  Concerns regarding medicines are outlined above.  Orders Placed This Encounter  Procedures   CBC   Basic Metabolic Panel (BMET)   No orders of the defined types were placed in this encounter.   Patient Instructions  Medication Instructions:  Your physician recommends that you continue on your current medications as directed. Please refer to the Current Medication list given to you today.  *If you need a refill on your cardiac medications before your next appointment, please call your pharmacy*   Lab Work: Your physician recommends that you have the following labs drawn today: CBC and BMP  If you have labs (blood work) drawn today and your tests are completely normal, you will receive your results only by: [place] (if you have MyChart) OR A paper copy in the mail If you have any lab test that is abnormal or we need to change your treatment, we will call you to review the results.   Testing/Procedures: Your physician has requested that you have a cardiac catheterization. Cardiac catheterization is used to diagnose and/or treat various heart conditions. Doctors may recommend this procedure for a number of different reasons. The most common reason is to evaluate chest pain. Chest pain can be a symptom of coronary artery  disease (CAD), and cardiac catheterization can show whether plaque is narrowing or blocking your heart's arteries. This procedure is also used to evaluate the valves, as well as measure the blood flow and oxygen levels in different parts of your heart. For further information please visit [email or web address]. Please follow instruction sheet, as given.    Follow-Up: At [facility], you and your health needs are our priority.  As part of our continuing mission to provide you with exceptional heart care, we have created designated Provider Care Teams.  These Care Teams include your primary Cardiologist (physician) and Advanced Practice Providers (APPs -  Physician Assistants and Nurse Practitioners) who all work together to provide you with the care you need, when you need it.  We recommend signing up for the patient portal called "MyChart".  Sign up information is provided on this After Visit Summary.  MyChart is used to connect with patients for Virtual Visits (Telemedicine).  Patients are able to view lab/test results, encounter notes, upcoming appointments, etc.  Non-urgent messages can be sent to your provider as well.   To learn more about what you can do with MyChart, go to [email or web address].    Your next appointment:   4 week(s) POST CATH   The format for your next appointment:   In Person  Provider:   Berniece Salines, DO     Other Instructions  [place] A DEPT OF [place] A DEPT OF [place]. [facility] [place] [id number] [place] [address] Dept: [phone] Loc: [phone]  Veronica Wolf  01/21/2022  You are scheduled for a Cardiac Catheterization on Wednesday, October 25 with Dr. Lenna Sciara.  1. Please arrive at the [facility] (Main Entrance A) at [facility]: [address] [place], [place] [address] at 10:30 AM (This time is two hours before your  procedure to ensure your preparation). Free valet parking service is available.   Special note: Every effort is made to have your procedure done on time. Please understand that emergencies sometimes delay scheduled procedures.  2. Diet: Do not eat solid foods after midnight.  The patient may have clear liquids until 5am upon the day of the procedure.  3. Labs drawn today In office  4. Medication instructions in preparation for your procedure:   Contrast Allergy: No  On the morning of your procedure, take your Aspirin 81 mg and any morning medicines NOT listed above.  You may use sips of water.  5. Plan for one night stay--bring personal belongings. 6. Bring a current list of your medications and current insurance cards. 7. You MUST have a responsible person to drive you home. 8. Someone MUST be with you the first 24 hours after you arrive home or your discharge will be delayed. 9. Please wear clothes that are easy to get on and off and wear slip-on shoes.  Thank you for allowing [place] to care for you!   -- [place] [facility]    Important Information About Sugar         Adopting a Healthy Lifestyle.  Know what a healthy weight is for you (roughly BMI <25) and aim to maintain this   Aim for 7+ servings of fruits and vegetables daily   65-80+ fluid ounces of water or unsweet tea for healthy kidneys   Limit to max 1 drink of alcohol per day; avoid smoking/tobacco   Limit animal fats  in diet for cholesterol and heart health - choose grass fed whenever available   Avoid highly processed foods, and foods high in saturated/trans fats   Aim for low stress - take time to unwind and care for your mental health   Aim for 150 min of moderate intensity exercise weekly for heart health, and weights twice weekly for bone health   Aim for 7-9 hours of sleep daily   When it comes to diets, agreement about the perfect plan isnt easy to find, even among the  experts. Experts at the [employer] developed an idea known as the Healthy Eating Plate. Just imagine a plate divided into logical, healthy portions.   The emphasis is on diet quality:   Load up on vegetables and fruits - one-half of your plate: Aim for color and variety, and remember that potatoes dont count.   Go for whole grains - one-quarter of your plate: Whole wheat, barley, wheat berries, quinoa, oats, brown rice, and foods made with them. If you want pasta, go with whole wheat pasta.   Protein power - one-quarter of your plate: Fish, chicken, beans, and nuts are all healthy, versatile protein sources. Limit red meat.   The diet, however, does go beyond the plate, offering a few other suggestions.   Use healthy plant oils, such as olive, canola, soy, corn, sunflower and peanut. Check the labels, and avoid partially hydrogenated oil, which have unhealthy trans fats.   If youre thirsty, drink water. Coffee and tea are good in moderation, but skip sugary drinks and limit milk and dairy products to one or two daily servings.   The type of carbohydrate in the diet is more important than the amount. Some sources of carbohydrates, such as vegetables, fruits, whole grains, and beans-are healthier than others.   Finally, stay active  Signed, Berniece Salines, DO  01/21/2022 9:52 PM    [facility]

## 2022-01-21 NOTE — Patient Instructions (Signed)
Medication Instructions:  Your physician recommends that you continue on your current medications as directed. Please refer to the Current Medication list given to you today.  *If you need a refill on your cardiac medications before your next appointment, please call your pharmacy*   Lab Work: Your physician recommends that you have the following labs drawn today: CBC and BMP  If you have labs (blood work) drawn today and your tests are completely normal, you will receive your results only by: MyChart Message (if you have MyChart) OR A paper copy in the mail If you have any lab test that is abnormal or we need to change your treatment, we will call you to review the results.   Testing/Procedures: Your physician has requested that you have a cardiac catheterization. Cardiac catheterization is used to diagnose and/or treat various heart conditions. Doctors may recommend this procedure for a number of different reasons. The most common reason is to evaluate chest pain. Chest pain can be a symptom of coronary artery disease (CAD), and cardiac catheterization can show whether plaque is narrowing or blocking your heart's arteries. This procedure is also used to evaluate the valves, as well as measure the blood flow and oxygen levels in different parts of your heart. For further information please visit [email or web address] Please follow instruction sheet, as given.    Follow-Up: At [facility], you and your health needs are our priority.  As part of our continuing mission to provide you with exceptional heart care, we have created designated Provider Care Teams.  These Care Teams include your primary Cardiologist (physician) and Advanced Practice Providers (APPs -  Physician Assistants and Nurse Practitioners) who all work together to provide you with the care you need, when you need it.  We recommend signing up for the patient portal called "MyChart".  Sign up information is provided on this  After Visit Summary.  MyChart is used to connect with patients for Virtual Visits (Telemedicine).  Patients are able to view lab/test results, encounter notes, upcoming appointments, etc.  Non-urgent messages can be sent to your provider as well.   To learn more about what you can do with MyChart, go to [email or web address].    Your next appointment:   4 week(s) POST CATH   The format for your next appointment:   In Person  Provider:   Thomasene Ripple, DO     Other Instructions  [place] [facility] A DEPT OF [place]. [facility] [place] [facility] [address] A DEPT OF [place]. [facility] [id number] [facility] [place] [address] Dept: [phone] Loc: [phone]  DAPHNA LAFUENTE  01/21/2022  You are scheduled for a Cardiac Catheterization on Wednesday, October 25 with Dr. Alverda Skeans.  1. Please arrive at the [facility] (Main Entrance A) at [facility]: [address] [place], [place] [address] at 10:30 AM (This time is two hours before your procedure to ensure your preparation). Free valet parking service is available.   Special note: Every effort is made to have your procedure done on time. Please understand that emergencies sometimes delay scheduled procedures.  2. Diet: Do not eat solid foods after midnight.  The patient may have clear liquids until 5am upon the day of the procedure.  3. Labs drawn today In office  4. Medication instructions in preparation for your procedure:   Contrast Allergy: No  On the morning of your procedure, take your Aspirin 81 mg and any morning medicines  NOT listed above.  You may use sips of water.  5. Plan for one night stay--bring personal belongings. 6. Bring a current list of your medications and current insurance cards. 7. You MUST have a responsible person to drive you home. 8. Someone MUST be with you the first 24 hours after you arrive home or your discharge will be delayed. 9.  Please wear clothes that are easy to get on and off and wear slip-on shoes.  Thank you for allowing [place] to care for you!   -- [place] [facility]    Important Information About Sugar

## 2022-01-22 ENCOUNTER — Ambulatory Visit: Payer: Medicare PPO | Admitting: Pharmacist

## 2022-01-22 DIAGNOSIS — K219 Gastro-esophageal reflux disease without esophagitis: Secondary | ICD-10-CM

## 2022-01-22 DIAGNOSIS — I2510 Atherosclerotic heart disease of native coronary artery without angina pectoris: Secondary | ICD-10-CM

## 2022-01-22 LAB — BASIC METABOLIC PANEL
BUN/Creatinine Ratio: 10 — ABNORMAL LOW (ref 12–28)
BUN: 16 mg/dL (ref 8–27)
CO2: 21 mmol/L (ref 20–29)
Calcium: 9 mg/dL (ref 8.7–10.3)
Chloride: 107 mmol/L — ABNORMAL HIGH (ref 96–106)
Creatinine, Ser: 1.62 mg/dL — ABNORMAL HIGH (ref 0.57–1.00)
Glucose: 91 mg/dL (ref 70–99)
Potassium: 4.9 mmol/L (ref 3.5–5.2)
Sodium: 143 mmol/L (ref 134–144)
eGFR: 32 mL/min/{1.73_m2} — ABNORMAL LOW (ref >59)

## 2022-01-22 LAB — CBC
Hematocrit: 39 % (ref 34.0–46.6)
Hemoglobin: 12.6 g/dL (ref 11.1–15.9)
MCH: 28.7 pg (ref 26.6–33.0)
MCHC: 32.3 g/dL (ref 31.5–35.7)
MCV: 89 fL (ref 79–97)
Platelets: 234 10*3/uL (ref 150–450)
RBC: 4.39 x10E6/uL (ref 3.77–5.28)
RDW: 14.1 % (ref 11.7–15.4)
WBC: 10.7 10*3/uL (ref 3.4–10.8)

## 2022-01-22 NOTE — Progress Notes (Unsigned)
Pharmacy Note  01/22/2022 Name: Veronica Wolf MRN: [id number] DOB: [birth year]  Subjective: Veronica Wolf is a 81 y.o. year old female who is a primary care patient of Debbrah Alar, NP. Clinical Pharmacist Practitioner referral was placed to assist with medication management.    Engaged with patient by telephone for follow up visit today.  ***   Recent Office Visits:  110/19/2023 - Cardio (Dr Harriet Masson) F/U chest pain. CP has not improved with increase in isosorbide to $RemoveBefor'60mg'[id number]$  daily. Ordered cath - scheduled for 01/27/2022.   SDOH (Social Determinants of Health) assessments and interventions performed:  SDOH Interventions    Flowsheet Row Chronic Care Management from 10/07/2021 in [facility] at [place] Management from 05/13/2021 in [facility] at [place] from 05/08/2021 in [place] at [place] Management from 03/25/2021 in [place] at [place] from 03/09/2021 in [place] Patient Outreach Telephone from 03/06/2021 in [place] Interventions        Food Insecurity Interventions -- Intervention Not Indicated, Other (Comment)  [Working with [facility] to [employer] Food/Nutrition Resources] [employer] Referral  [employer] on Wheels] -- -- --  Housing Interventions Intervention Not Indicated Intervention Not Indicated -- -- -- --  Transportation Interventions -- Intervention Not Indicated, Other (Comment)  [Working with [facility] to [employer] and Resources] -- -- Other (Comment)  [Patient outreached and going to mail her information on [place] transportation and also [place] transport[place]] --  [referral sent to community care guide for transportation assistance]  Depression Interventions/Treatment  --  Referral to Psychiatry, Medication, Counseling -- -- -- Medication  Financial Strain Interventions Intervention Not Indicated Intervention Not Indicated -- -- -- --  Physical Activity Interventions -- Patient Refused -- Patient Refused -- --  Stress Interventions -- [employer], Provide Counseling, Other (Comment)  Financial planner for Counseling Services] -- -- -- --  Social Connections Interventions -- Patient Refused -- -- -- --        Objective: Review of patient status, including review of consultants reports, laboratory and other test data, was performed as part of comprehensive evaluation and provision of chronic care management services.   Lab Results  Component Value Date   CREATININE 1.62 (H) 01/21/2022   CREATININE 1.45 (H) 11/06/2021   CREATININE 1.52 (H) 08/26/2021    Lab Results  Component Value Date   HGBA1C 6.5 01/09/2021       Component Value Date/Time   CHOL 127 08/26/2021 0905   TRIG 135.0 08/26/2021 0905   HDL 43.10 08/26/2021 0905   CHOLHDL 3 08/26/2021 0905   VLDL 27.0 08/26/2021 0905   LDLCALC 57 08/26/2021 0905   LDLCALC 108 (H) 03/14/2020 1439     Clinical ASCVD: {YES/NO:21197} The ASCVD Risk score (Arnett DK, et al., 2019) failed to calculate for the following reasons:   The 2019 ASCVD risk score is only valid for ages 27 to 41   The patient has a prior MI or stroke diagnosis    BP Readings from Last 3 Encounters:  01/21/22 126/78  11/06/21 131/61  09/17/21 130/70     Allergies  Allergen Reactions   Ibuprofen Other (See Comments)    Pt has hx of peptic ulcer and diverticulitis.     Medications Reviewed Today     Reviewed by Antony Contras,  Jenicka Coxe, RPH-CPP (Pharmacist) on 01/22/22 at [place] List Status: <None>   Medication Order Taking? Sig Documenting Provider Last Dose Status Informant  albuterol (PROVENTIL) (2.5 MG/3ML) 0.083% nebulizer solution [id number] Yes USE 1 VIAL IN NEBULIZER EVERY 6 HOURS AS NEEDED FOR WHEEZING  AND FOR SHORTNESS OF Thayer Headings, NP Taking Active   albuterol (VENTOLIN HFA) 108 (90 Base) MCG/ACT inhaler [id number] Yes Inhale 2 puffs into the lungs every 6 (six) hours as needed for wheezing or shortness of breath. Debbrah Alar, NP Taking Active   allopurinol (ZYLOPRIM) 100 MG tablet [id number] Yes TAKE TWO TABLETS BY MOUTH ONCE DAILY Debbrah Alar, NP Taking Active   aspirin 81 MG EC tablet [id number] Yes Take 1 tablet (81 mg total) by mouth daily. Swallow whole. Berniece Salines, DO Taking Active            Med Note Antony Contras, Safa Derner B   Wed Oct 07, 2021 11:19 AM)    atorvastatin (LIPITOR) 80 MG tablet [id number] Yes TAKE ONE TABLET BY MOUTH EVERYDAY AT BEDTIME Tobb, Kardie, DO Taking Active   chlorhexidine (PERIDEX) 0.12 % solution [id number] Yes SMARTSIG:0.5 Ounce(s) By Mouth Twice Daily [provider] Taking Active   colchicine 0.6 MG tablet [id number] Yes Take 0.6 mg by mouth daily as needed (gout). [provider] Taking Active   diazepam (VALIUM) 10 MG tablet [id number] Yes TAKE ONE TABLET BY MOUTH EVERY TWELVE HOURS AS NEEDED FOR ANXIETY Debbrah Alar, NP Taking Active   dicyclomine (BENTYL) 10 MG capsule [id number] Yes Take 1 capsule (10 mg total) by mouth in the morning, at noon, in the evening, and at bedtime. Debbrah Alar, NP Taking Active   escitalopram (LEXAPRO) 20 MG tablet [id number] Yes TAKE ONE TABLET BY MOUTH EVERY MORNING Debbrah Alar, NP Taking Active   ezetimibe (ZETIA) 10 MG tablet [id number] Yes TAKE ONE TABLET BY MOUTH ONCE DAILY Tobb, Kardie, DO Taking Active   famotidine (PEPCID) 20 MG tablet [id number] Yes TAKE ONE TABLET BY MOUTH EVERY MORNING and TAKE ONE TABLET BY MOUTH EVERYDAY AT BEDTIME Debbrah Alar, NP Taking Active   isosorbide mononitrate (IMDUR) 60 MG 24 hr tablet [id number] Yes Take 1 tablet (60 mg total) by mouth daily. Berniece Salines, DO Taking Active   levothyroxine (SYNTHROID) 25 MCG tablet [id number]  Yes TAKE ONE TABLET BY MOUTH EVERY MORNING Debbrah Alar, NP Taking Active   lisinopril (ZESTRIL) 2.5 MG tablet [id number] Yes TAKE ONE TABLET BY MOUTH EVERYDAY AT BEDTIME Tobb, Kardie, DO Taking Active   meclizine (ANTIVERT) 25 MG tablet [id number] Yes TAKE ONE TABLET BY MOUTH twice daily AS NEEDED FOR dizziness Debbrah Alar, NP Taking Active   metoprolol succinate (TOPROL-XL) 25 MG 24 hr tablet [id number] Yes Take 0.5 tablets by mouth daily. [provider] Taking Active   nitroGLYCERIN (NITROSTAT) 0.4 MG SL tablet [id number] Yes Place 1 tablet (0.4 mg total) under the tongue every 5 (five) minutes as needed for chest pain. Berniece Salines, DO Taking Active   nystatin (MYCOSTATIN/NYSTOP) powder [id number] Yes APPLY powder TOPICALLY TO THE AFFECTED AREA(S) TWICE DAILY Debbrah Alar, NP Taking Active   pantoprazole (PROTONIX) 40 MG tablet [id number] Yes TAKE ONE TABLET BY MOUTH TWICE DAILY Debbrah Alar, NP Taking Active   potassium chloride SA (KLOR-CON M) 20 MEQ tablet [id number] Yes TAKE ONE TABLET BY MOUTH AT Talbert Nan, NP Taking Active   Respiratory Therapy Supplies (NEBULIZER/TUBING/MOUTHPIECE) KIT [id number] Yes Use as directed Debbrah Alar, NP Taking Active   sucralfate (CARAFATE) 1 g tablet [id number]  No TAKE ONE TABLET BY MOUTH FOUR TIMES DAILY. MAY cut pill in half AND DISSOLVE in water prior TO drinking  Patient not taking: Reported on 01/22/2022   Debbrah Alar, NP Not Taking Active             Patient Active Problem List   Diagnosis Date Noted   Pulmonary hypertension, unspecified ([place]) 09/17/2021   Morbid obesity ([place]) 09/17/2021   Acute vaginitis 09/14/2021   Atypical chest pain 08/26/2021   Memory loss 08/26/2021   Cough 04/21/2021   Back pain 12/03/2020   Acute cystitis without hematuria 12/03/2020   Grief reaction 12/03/2020   Fatty liver 12/01/2020   Intermittent left-sided chest pain 09/17/2020   Coronary artery  disease involving native coronary artery of native heart 12/82/0813   Metabolic syndrome 88/71/9597   Prediabetes 09/17/2020   Allergy 09/16/2020   Anxiety 09/16/2020   Hypertension 09/16/2020   Vertigo 09/16/2020   Dark stools 09/08/2020   Dysphagia    Esophageal stricture    Hiatal hernia    Epigastric pain    Hypothyroid 01/25/2018   Mild CAD 10/03/2017   Stress-induced cardiomyopathy 09/19/2017   COPD (chronic obstructive pulmonary disease) ([place]) 09/14/2017   Hypokalemia 09/14/2017   Non-ST elevation (NSTEMI) myocardial infarction [facility]) - due to Demand Infarction 09/14/2017   CKD (chronic kidney disease) 04/04/2017   HLD (hyperlipidemia)    Allergic rhinitis    Gout 02/28/2017   Depression with anxiety 02/28/2017   Epigastric abdominal pain 12/22/2016   COPD with chronic bronchitis 12/22/2016   GERD (gastroesophageal reflux disease) 12/22/2016   DOE (dyspnea on exertion) 12/22/2016   Peptic ulcer 01/04/2003     Medication Assistance:  {MEDASSISTANCEINFO:25044}   Assessment:     Plan:  Follow Up:  {CM FOLLOW UP IXVE:55015}   Cherre Robins, PharmD Clinical Pharmacist [place] [phone]

## 2022-01-25 ENCOUNTER — Telehealth: Payer: Self-pay | Admitting: *Deleted

## 2022-01-25 MED ORDER — FAMOTIDINE 20 MG PO TABS: 20.0000 mg | ORAL_TABLET | Freq: Every day | ORAL | 1 refills | Status: DC

## 2022-01-25 NOTE — Telephone Encounter (Signed)
Left message for patient's daughter, [name], to discuss procedure instructions, pre-procedure hydration.

## 2022-01-25 NOTE — Telephone Encounter (Signed)
Cardiac Catheterization scheduled at [facility] for: Wednesday January 27, 2022 12:30 PM Arrival time and place: [place] Entrance A at:  7:30 AM-pre-procedure hydration  Nothing to eat after midnight prior to procedure, clear liquids until 5 AM day of procedure.  Medication instructions: -Hold:  Lisinopril-day before and day of procedure-per protocol GFR 32 -Except hold medications usual morning medications can be taken with sips of water including aspirin 81 mg.  Confirmed patient has responsible adult to drive home post procedure and be with patient first 24 hours after arriving home.  Patient reports no new symptoms concerning for COVID-19 in the past 10 days.  Reviewed procedure instructions, discussed pre-procedure hydration.  Left message for patient's daughter [facility]) Veronica Wolf, to call back to review procedure instructions, discuss pre-procedure hydration.

## 2022-01-25 NOTE — Telephone Encounter (Signed)
If any faxes come through for this company, [employer], patient did not request this and does not want any diabetic shoes.

## 2022-01-25 NOTE — Telephone Encounter (Signed)
Call placed to patient's daughter, Sharee Pimple, to review procedure instructions, no answer,voicemail message.

## 2022-01-26 ENCOUNTER — Other Ambulatory Visit: Payer: Self-pay | Admitting: Family

## 2022-01-26 NOTE — Telephone Encounter (Signed)
Reviewed procedure instructions with patient's daughter (DPR), [name].

## 2022-01-26 NOTE — Telephone Encounter (Signed)
Daughter returned RN's call.  Daughter wants to know what the procedure is about and how long the procedure will take.

## 2022-01-26 NOTE — Telephone Encounter (Signed)
Call placed to patient's daughter, [name], to review procedure instructions, no answer, mailbox full.

## 2022-01-26 NOTE — Telephone Encounter (Signed)
Medication: nystatin (MYCOSTATIN/NYSTOP) powder   Has the patient contacted their pharmacy? No.  Preferred Pharmacy (with phone number or street name):  [facility] - [place], [place] - [address], [place] [address] Phone: [phone]  Fax: [phone]

## 2022-01-27 ENCOUNTER — Ambulatory Visit (HOSPITAL_COMMUNITY)
Admission: RE | Admit: 2022-01-27 | Discharge: 2022-01-27 | Disposition: A | Discharge status: Home / Self Care | Payer: Medicare PPO | Attending: Internal Medicine | Admitting: Internal Medicine

## 2022-01-27 ENCOUNTER — Other Ambulatory Visit: Payer: Self-pay | Admitting: Family

## 2022-01-27 ENCOUNTER — Other Ambulatory Visit: Payer: Self-pay

## 2022-01-27 ENCOUNTER — Encounter (HOSPITAL_COMMUNITY)
Admission: RE | Disposition: A | Discharge status: Home / Self Care | Payer: Self-pay | Source: Home / Self Care | Attending: Internal Medicine

## 2022-01-27 DIAGNOSIS — I25119 Atherosclerotic heart disease of native coronary artery with unspecified angina pectoris: Secondary | ICD-10-CM | POA: Insufficient documentation

## 2022-01-27 DIAGNOSIS — Z6832 Body mass index (BMI) 32.0-32.9, adult: Secondary | ICD-10-CM | POA: Diagnosis not present

## 2022-01-27 DIAGNOSIS — Z87891 Personal history of nicotine dependence: Secondary | ICD-10-CM | POA: Diagnosis not present

## 2022-01-27 DIAGNOSIS — N189 Chronic kidney disease, unspecified: Secondary | ICD-10-CM | POA: Insufficient documentation

## 2022-01-27 DIAGNOSIS — R42 Dizziness and giddiness: Secondary | ICD-10-CM

## 2022-01-27 DIAGNOSIS — I209 Angina pectoris, unspecified: Secondary | ICD-10-CM | POA: Diagnosis not present

## 2022-01-27 DIAGNOSIS — I129 Hypertensive chronic kidney disease with stage 1 through stage 4 chronic kidney disease, or unspecified chronic kidney disease: Secondary | ICD-10-CM | POA: Diagnosis not present

## 2022-01-27 DIAGNOSIS — E669 Obesity, unspecified: Secondary | ICD-10-CM | POA: Diagnosis not present

## 2022-01-27 DIAGNOSIS — Z79899 Other long term (current) drug therapy: Secondary | ICD-10-CM | POA: Diagnosis not present

## 2022-01-27 DIAGNOSIS — Z7982 Long term (current) use of aspirin: Secondary | ICD-10-CM | POA: Insufficient documentation

## 2022-01-27 DIAGNOSIS — E8881 Metabolic syndrome: Secondary | ICD-10-CM | POA: Diagnosis not present

## 2022-01-27 DIAGNOSIS — E782 Mixed hyperlipidemia: Secondary | ICD-10-CM | POA: Insufficient documentation

## 2022-01-27 HISTORY — PX: INTRAVASCULAR PRESSURE WIRE/FFR STUDY: CATH118243

## 2022-01-27 HISTORY — PX: LEFT HEART CATH AND CORONARY ANGIOGRAPHY: CATH118249

## 2022-01-27 LAB — GLUCOSE, CAPILLARY
Glucose-Capillary: 101 mg/dL — ABNORMAL HIGH (ref 70–99)
Glucose-Capillary: 80 mg/dL (ref 70–99)
Glucose-Capillary: 67 mg/dL — ABNORMAL LOW (ref 70–99)

## 2022-01-27 LAB — POCT ACTIVATED CLOTTING TIME: Activated Clotting Time: 317 seconds

## 2022-01-27 SURGERY — LEFT HEART CATH AND CORONARY ANGIOGRAPHY
Anesthesia: LOCAL

## 2022-01-27 MED ORDER — SODIUM CHLORIDE 0.9% FLUSH: 3.0000 mL | INTRAVENOUS | Status: DC | PRN

## 2022-01-27 MED ORDER — SODIUM CHLORIDE 0.9% FLUSH: 3.0000 mL | Freq: Two times a day (BID) | INTRAVENOUS | Status: DC

## 2022-01-27 MED ORDER — ASPIRIN 81 MG PO CHEW
81.0000 mg | CHEWABLE_TABLET | ORAL | Status: AC
  Administered 2022-01-27: 81 mg via ORAL
  Filled 2022-01-27: qty 1

## 2022-01-27 MED ORDER — ADENOSINE 12 MG/4ML IV SOLN
INTRAVENOUS | Status: AC
  Filled 2022-01-27: qty 16

## 2022-01-27 MED ORDER — FENTANYL CITRATE (PF) 100 MCG/2ML IJ SOLN
INTRAMUSCULAR | Status: AC
  Filled 2022-01-27: qty 2

## 2022-01-27 MED ORDER — VERAPAMIL HCL 2.5 MG/ML IV SOLN
INTRAVENOUS | Status: DC | PRN
  Administered 2022-01-27: 10 mL via INTRA_ARTERIAL

## 2022-01-27 MED ORDER — LIDOCAINE HCL (PF) 1 % IJ SOLN
INTRAMUSCULAR | Status: DC | PRN
  Administered 2022-01-27: 2 mL

## 2022-01-27 MED ORDER — SODIUM CHLORIDE 0.9 % WEIGHT BASED INFUSION: 1.0000 mL/kg/h | INTRAVENOUS | Status: DC

## 2022-01-27 MED ORDER — VERAPAMIL HCL 2.5 MG/ML IV SOLN
INTRAVENOUS | Status: AC
  Filled 2022-01-27: qty 2

## 2022-01-27 MED ORDER — SODIUM CHLORIDE 0.9 % IV SOLN: INTRAVENOUS | Status: DC

## 2022-01-27 MED ORDER — ACETAMINOPHEN 325 MG PO TABS: 650.0000 mg | ORAL_TABLET | ORAL | Status: DC | PRN

## 2022-01-27 MED ORDER — HEPARIN (PORCINE) IN NACL 1000-0.9 UT/500ML-% IV SOLN
INTRAVENOUS | Status: AC
  Filled 2022-01-27: qty 1000

## 2022-01-27 MED ORDER — SODIUM CHLORIDE 0.9 % WEIGHT BASED INFUSION
3.0000 mL/kg/h | INTRAVENOUS | Status: AC
  Administered 2022-01-27: 3 mL/kg/h via INTRAVENOUS

## 2022-01-27 MED ORDER — IOHEXOL 350 MG/ML SOLN
INTRAVENOUS | Status: DC | PRN
  Administered 2022-01-27: 60 mL via INTRA_ARTERIAL

## 2022-01-27 MED ORDER — HEPARIN SODIUM (PORCINE) 1000 UNIT/ML IJ SOLN
INTRAMUSCULAR | Status: DC | PRN
  Administered 2022-01-27: 5000 [IU] via INTRAVENOUS
  Administered 2022-01-27: 5000 [IU] via INTRA_ARTERIAL

## 2022-01-27 MED ORDER — ADENOSINE (DIAGNOSTIC) 140MCG/KG/MIN
INTRAVENOUS | Status: DC | PRN
  Administered 2022-01-27: 140 ug/kg/min via INTRAVENOUS

## 2022-01-27 MED ORDER — SODIUM CHLORIDE 0.9 % IV SOLN: 250.0000 mL | INTRAVENOUS | Status: DC | PRN

## 2022-01-27 MED ORDER — LABETALOL HCL 5 MG/ML IV SOLN: 10.0000 mg | INTRAVENOUS | Status: DC | PRN

## 2022-01-27 MED ORDER — MIDAZOLAM HCL 2 MG/2ML IJ SOLN
INTRAMUSCULAR | Status: AC
  Filled 2022-01-27: qty 2

## 2022-01-27 MED ORDER — HEPARIN (PORCINE) IN NACL 1000-0.9 UT/500ML-% IV SOLN
INTRAVENOUS | Status: DC | PRN
  Administered 2022-01-27 (×3): 500 mL

## 2022-01-27 MED ORDER — ONDANSETRON HCL 4 MG/2ML IJ SOLN: 4.0000 mg | Freq: Four times a day (QID) | INTRAMUSCULAR | Status: DC | PRN

## 2022-01-27 MED ORDER — HYDRALAZINE HCL 20 MG/ML IJ SOLN: 10.0000 mg | INTRAMUSCULAR | Status: DC | PRN

## 2022-01-27 MED ORDER — LIDOCAINE HCL (PF) 1 % IJ SOLN
INTRAMUSCULAR | Status: AC
  Filled 2022-01-27: qty 30

## 2022-01-27 MED ORDER — FENTANYL CITRATE (PF) 100 MCG/2ML IJ SOLN
INTRAMUSCULAR | Status: DC | PRN
  Administered 2022-01-27 (×2): 25 ug via INTRAVENOUS

## 2022-01-27 MED ORDER — MIDAZOLAM HCL 2 MG/2ML IJ SOLN
INTRAMUSCULAR | Status: DC | PRN
  Administered 2022-01-27 (×2): 1 mg via INTRAVENOUS

## 2022-01-27 MED ORDER — HEPARIN SODIUM (PORCINE) 1000 UNIT/ML IJ SOLN
INTRAMUSCULAR | Status: AC
  Filled 2022-01-27: qty 10

## 2022-01-27 SURGICAL SUPPLY — 12 items
CATH INFINITI 6F ANG MULTIPACK (CATHETERS) IMPLANT
CATH LAUNCHER 6FR JR4 (CATHETERS) IMPLANT
DEVICE RAD COMP TR BAND LRG (VASCULAR PRODUCTS) IMPLANT
GLIDESHEATH SLEND SS 6F .021 (SHEATH) IMPLANT
GUIDEWIRE PRESSURE X 175 (WIRE) IMPLANT
KIT ESSENTIALS PG (KITS) IMPLANT
KIT HEART LEFT (KITS) ×1 IMPLANT
PACK CARDIAC CATHETERIZATION (CUSTOM PROCEDURE TRAY) ×1 IMPLANT
TRANSDUCER W/STOPCOCK (MISCELLANEOUS) ×1 IMPLANT
TUBING CIL FLEX 10 FLL-RA (TUBING) ×1 IMPLANT
WIRE EMERALD 3MM-J .035X260CM (WIRE) IMPLANT
WIRE EMERALD ST .035X260CM (WIRE) IMPLANT

## 2022-01-27 NOTE — Interval H&P Note (Signed)
History and Physical Interval Note:  01/27/2022 12:11 PM  Veronica Wolf  has presented today for surgery, with the diagnosis of CAD with Angina.  The various methods of treatment have been discussed with the patient and family. After consideration of risks, benefits and other options for treatment, the patient has consented to  Procedure(s): LEFT HEART CATH AND CORONARY ANGIOGRAPHY (N/A) as a surgical intervention.  The patient's history has been reviewed, patient examined, no change in status, stable for surgery.  I have reviewed the patient's chart and labs.  Questions were answered to the patient's satisfaction.    Cath Lab Visit (complete for each Cath Lab visit)  Clinical Evaluation Leading to the Procedure:   ACS: No.  Non-ACS:    Anginal Classification: CCS I  Anti-ischemic medical therapy: Maximal Therapy (2 or more classes of medications)  Non-Invasive Test Results: No non-invasive testing performed  Prior CABG: No previous CABG        Early [place]

## 2022-01-27 NOTE — Progress Notes (Signed)
TR BAND REMOVAL  LOCATION:    left radial  DEFLATED PER PROTOCOL:    Yes.    TIME BAND OFF / DRESSING APPLIED: 01/27/22 at [place] ARRIVAL:    Level 0  SITE AFTER BAND REMOVAL:    Level 0  CIRCULATION SENSATION AND MOVEMENT:    Within Normal Limits   Yes.    COMMENTS:

## 2022-01-27 NOTE — Telephone Encounter (Signed)
Please advise 

## 2022-01-28 ENCOUNTER — Ambulatory Visit (INDEPENDENT_AMBULATORY_CARE_PROVIDER_SITE_OTHER): Payer: Medicare PPO | Admitting: Pharmacist

## 2022-01-28 ENCOUNTER — Encounter (HOSPITAL_COMMUNITY): Payer: Self-pay | Admitting: Internal Medicine

## 2022-01-28 DIAGNOSIS — E782 Mixed hyperlipidemia: Secondary | ICD-10-CM

## 2022-01-28 DIAGNOSIS — I2510 Atherosclerotic heart disease of native coronary artery without angina pectoris: Secondary | ICD-10-CM

## 2022-01-28 MED ORDER — NYSTATIN 100000 UNIT/GM EX POWD: CUTANEOUS | 4 refills | Status: DC

## 2022-01-28 NOTE — Progress Notes (Signed)
Pharmacy Note  01/28/2022 Name: Veronica Wolf MRN: [id number] DOB: [birth year]  Subjective: Veronica Wolf is a 81 y.o. year old female who is a primary care patient of Debbrah Alar, NP. Clinical Pharmacist Practitioner referral was placed to assist with medication management.    Engaged with patient by telephone for follow up visit today.   Medication Management: Patient continues to receive medications from her pharmacy in adherence packaging to help her to take the right medications daily. She endorses that she has been taking as directed but asks if she misses a morning dose can she take with her noon medications. Advised that is she gets up late and has to take her morning and noon packages together it would be OK because only medication in noon package is potassium. Verified next packaging delivery date is 02/01/2022. [facility] will send Nystatin powder out tomorrow.  Chest Pain / CAD: patient  had cath yesterday. In initial report dose not appear to be significant blockages that will require intervention. Patient;s last LDL was 53 and she is taking atrovastatin 53m daily + ezetimibe 139mand aspirin 8123maily. She reports some bruising after procedure on her breast.  She also states prior to cath yesterday she had noticed some clear discharge from her nipples from time to time. Last mammogram was 2017. She has a sister that had breast cancer and patient is concerned about nipple discharge.  Decreased eGFR: Noted that labs from 01/21/2022 showed eGFR of 32 CKD 3b. Estimated CrCL = 36 mL/min. Reviewed medication list for medication adjustments.   Recent Office Visits:  01/27/2022 - Left Heart Cath Conclusion:. 1.  Mild obstructive coronary artery disease. 2.  LVEDP 18m34m 3.  Occluded right radial artery. 4.  CFR of 2.6 with normal being greater than 2.5 and IMR of 50 with normal being greater less than 25.  This suggests that the patient has some degree of microvascular  dysfunction. Recommendation: Medical therapy and consider discontinuing nitrates.  01/21/2022 - Cardio (Dr TobbHarriet MassonU chest pain. CP has not improved with increase in isosorbide to 60mg73mly. Ordered cath - scheduled for 01/27/2022.   SDOH (Social Determinants of Health) assessments and interventions performed:  SDOH Interventions    Flowsheet Row Chronic Care Management from [facility] 05/08/2021 in [facility] from 03/25/2021 in [facility] 03/09/2021 in [facility] Tele[facility] from 03/06/2021 in [facility]        Food Insecurity Interventions -- Intervention Not Indicated, Other (Comment)  [Working with [employer]/Nutrition Resources] [employer]  [[employer]] -- -- --  Housing Interventions Intervention Not Indicated Intervention Not Indicated -- -- -- --  Transportation Interventions -- Intervention Not Indicated, Other (Comment)  [Working with [employer]] -- -- Other (Comment)  [Patient outreached and going to mail her information on human[place] and also [place] transport[place]] --  [referral sent to community care guide for transportation assistance]  Depression Interventions/Treatment  -- Referral to Psychiatry, Medication, Counseling -- -- -- Medication  Financial Strain Interventions Intervention Not Indicated Intervention Not Indicated -- -- -- --  Physical Activity Interventions -- Patient Refused --  Patient Refused -- --  Stress Interventions -- [employer],  Provide Counseling, Other (Comment)  Financial planner for Counseling Services] -- -- -- --  Social Connections Interventions -- Patient Refused -- -- -- --        Objective: Review of patient status, including review of consultants reports, laboratory and other test data, was performed as part of comprehensive evaluation and provision of chronic care management services.   Lab Results  Component Value Date   CREATININE 1.62 (H) 01/21/2022   CREATININE 1.45 (H) 11/06/2021   CREATININE 1.52 (H) 08/26/2021    Lab Results  Component Value Date   HGBA1C 6.5 01/09/2021       Component Value Date/Time   CHOL 127 08/26/2021 0905   TRIG 135.0 08/26/2021 0905   HDL 43.10 08/26/2021 0905   CHOLHDL 3 08/26/2021 0905   VLDL 27.0 08/26/2021 0905   LDLCALC 57 08/26/2021 0905   LDLCALC 108 (H) 03/14/2020 1439     Clinical ASCVD: Yes  The ASCVD Risk score (Arnett DK, et al., 2019) failed to calculate for the following reasons:   The 2019 ASCVD risk score is only valid for ages 38 to 30   The patient has a prior MI or stroke diagnosis    BP Readings from Last 3 Encounters:  01/27/22 109/63  01/21/22 126/78  11/06/21 131/61     Allergies  Allergen Reactions   Ibuprofen Other (See Comments)    Pt has hx of peptic ulcer and diverticulitis.     Medications Reviewed Today     Reviewed by Cherre Robins, RPH-CPP (Pharmacist) on 01/28/22 at 1502  Med List Status: <None>   Medication Order Taking? Sig Documenting Provider Last Dose Status Informant  albuterol (PROVENTIL) (2.5 MG/3ML) 0.083% nebulizer solution [id number] Yes USE 1 VIAL IN NEBULIZER EVERY 6 HOURS AS NEEDED FOR WHEEZING AND FOR SHORTNESS OF Thayer Headings, NP Taking Active   albuterol (VENTOLIN HFA) 108 (90 Base) MCG/ACT inhaler [id number] Yes Inhale 2 puffs into the lungs every 6 (six) hours as needed for wheezing or shortness of breath. Debbrah Alar, NP Taking Active   allopurinol (ZYLOPRIM) 100 MG tablet  [id number] Yes TAKE TWO TABLETS BY MOUTH ONCE DAILY Debbrah Alar, NP Taking Active   aspirin 81 MG EC tablet [id number] Yes Take 1 tablet (81 mg total) by mouth daily. Swallow whole. Berniece Salines, DO Taking Active            Med Note Antony Contras, Lejend Dalby B   Wed Oct 07, 2021 11:19 AM)    atorvastatin (LIPITOR) 80 MG tablet [id number] Yes TAKE ONE TABLET BY MOUTH EVERYDAY AT BEDTIME Tobb, Kardie, DO Taking Active   chlorhexidine (PERIDEX) 0.12 % solution [id number] Yes SMARTSIG:0.5 Ounce(s) By Mouth Twice Daily [provider] Taking Active   colchicine 0.6 MG tablet [id number] No Take 0.6 mg by mouth daily as needed (gout).  Patient not taking: Reported on 01/28/2022   [provider] Not Taking Active   diazepam (VALIUM) 10 MG tablet [id number] Yes TAKE ONE TABLET BY MOUTH EVERY TWELVE HOURS AS NEEDED FOR ANXIETY Debbrah Alar, NP Taking Active   dicyclomine (BENTYL) 10 MG capsule [id number] No Take 1 capsule (10 mg total) by mouth in the morning, at noon, in the evening, and at bedtime.  Patient not taking: Reported on 01/28/2022   Debbrah Alar, NP Not Taking Active   escitalopram (LEXAPRO) 20 MG tablet [id number] Yes TAKE ONE TABLET BY MOUTH EVERY MORNING Debbrah Alar, NP Taking Active  ezetimibe (ZETIA) 10 MG tablet [id number] Yes TAKE ONE TABLET BY MOUTH ONCE DAILY Tobb, Kardie, DO Taking Active   famotidine (PEPCID) 20 MG tablet [id number] Yes Take 1 tablet (20 mg total) by mouth at bedtime. Colon Branch, MD Taking Active   isosorbide mononitrate (IMDUR) 60 MG 24 hr tablet [id number] Yes Take 1 tablet (60 mg total) by mouth daily. Berniece Salines, DO Taking Active   levothyroxine (SYNTHROID) 25 MCG tablet [id number] Yes TAKE ONE TABLET BY MOUTH EVERY MORNING Debbrah Alar, NP Taking Active   lisinopril (ZESTRIL) 2.5 MG tablet [id number] Yes TAKE ONE TABLET BY MOUTH EVERYDAY AT BEDTIME Tobb, Kardie, DO Taking Active   meclizine (ANTIVERT) 25 MG tablet [id number] Yes  TAKE ONE TABLET BY MOUTH twice daily AS NEEDED FOR Terence Lux, NP Taking Active   metoprolol succinate (TOPROL-XL) 25 MG 24 hr tablet [id number] Yes Take 0.5 tablets by mouth daily. [provider] Taking Active   nitroGLYCERIN (NITROSTAT) 0.4 MG SL tablet [id number] Yes Place 1 tablet (0.4 mg total) under the tongue every 5 (five) minutes as needed for chest pain. Berniece Salines, DO Taking Active   nystatin (MYCOSTATIN/NYSTOP) powder [id number] Yes APPLY powder TOPICALLY TO THE AFFECTED AREA(S) TWICE DAILY Debbrah Alar, NP Taking Active   pantoprazole (PROTONIX) 40 MG tablet [id number] Yes TAKE ONE TABLET BY MOUTH TWICE DAILY Debbrah Alar, NP Taking Active   potassium chloride SA (KLOR-CON M) 20 MEQ tablet [id number] Yes TAKE ONE TABLET BY MOUTH AT [place] Bruin, Lenna Sciara, NP Taking Active   Respiratory Therapy Supplies (NEBULIZER/TUBING/MOUTHPIECE) KIT [id number] Yes Use as directed Debbrah Alar, NP Taking Active   sucralfate (CARAFATE) 1 g tablet [id number] No TAKE ONE TABLET BY MOUTH FOUR TIMES DAILY. MAY cut pill in half AND DISSOLVE in water prior TO drinking  Patient not taking: Reported on 01/28/2022   Debbrah Alar, NP Not Taking Active             Patient Active Problem List   Diagnosis Date Noted   Pulmonary hypertension, unspecified ([place]) 09/17/2021   Morbid obesity ([place]) 09/17/2021   Acute vaginitis 09/14/2021   Atypical chest pain 08/26/2021   Memory loss 08/26/2021   Cough 04/21/2021   Back pain 12/03/2020   Acute cystitis without hematuria 12/03/2020   Grief reaction 12/03/2020   Fatty liver 12/01/2020   Intermittent left-sided chest pain 09/17/2020   Coronary artery disease involving native coronary artery of native heart 57/04/7791   Metabolic syndrome 90/30/0923   Prediabetes 09/17/2020   Allergy 09/16/2020   Anxiety 09/16/2020   Hypertension 09/16/2020   Vertigo 09/16/2020   Dark stools 09/08/2020   Dysphagia     Esophageal stricture    Hiatal hernia    Epigastric pain    Hypothyroid 01/25/2018   Mild CAD 10/03/2017   Stress-induced cardiomyopathy 09/19/2017   COPD (chronic obstructive pulmonary disease) ([place]) 09/14/2017   Hypokalemia 09/14/2017   Non-ST elevation (NSTEMI) myocardial infarction [facility]) - due to Demand Infarction 09/14/2017   CKD (chronic kidney disease) 04/04/2017   HLD (hyperlipidemia)    Allergic rhinitis    Gout 02/28/2017   Depression with anxiety 02/28/2017   Epigastric abdominal pain 12/22/2016   COPD with chronic bronchitis 12/22/2016   GERD (gastroesophageal reflux disease) 12/22/2016   DOE (dyspnea on exertion) 12/22/2016   Peptic ulcer 01/04/2003     Medication Assistance:  None required.  Patient affirms current coverage meets needs.   Assessment / Plan: Medication Management: Continue to use adherence packaging. Checked with pharmacy to  see when next packaging will be delivered. Due either 10/30. Patient's current packaging should last until 02/01/2022.   Chest Pain / CAD: Last LDL at goal; see cath results from 01/27/2022 Continue atorvastatin and ezetimibe  Breast / Nipple Discharge:  Will consult with PCP about mammogram order and if needed OV with PCP.  Decreased eGFR: Noted that labs from 01/21/2022 showed eGFR of 32 CKD 3b. Estimated CrCL = 36 mL/min.  Recommend recheck BMP / CMP in 4 weeks.   Consider addition of renal protective medication in future like SGLT2 or [place].   Follow Up:  Telephone follow up appointment with care management team member scheduled for:  02/2022.   Cherre Robins, PharmD Clinical Pharmacist [place] [phone]

## 2022-01-29 ENCOUNTER — Telehealth: Payer: Self-pay | Admitting: Internal Medicine

## 2022-01-29 ENCOUNTER — Telehealth: Payer: Self-pay | Admitting: Family

## 2022-01-29 ENCOUNTER — Telehealth: Payer: Self-pay

## 2022-01-29 NOTE — Telephone Encounter (Signed)
Notified pharmacy that prior authorization had been completed. [facility] states can be delivered this afternoon.   Veronica Wolf (KeyOllen Barges - [id number] Nyamyc 100000 UNIT/GM powder Status: PA Response - ApprovedCreated: October 26th, 2023 [phone]: October 27th, 2023 Open  Archive

## 2022-01-29 NOTE — Telephone Encounter (Signed)
Pharmacy stated they are waiting on PA for nystatin (MYCOSTATIN/NYSTOP) powder.   [facility] - [place], [place] - [address], [place] [address] Phone: [phone]  Fax: [phone]

## 2022-01-29 NOTE — Telephone Encounter (Signed)
Pt called stating that she is having some abdominal pain.   She also says "the thing on her arm" came off.   -It was hard to make out what she was saying.

## 2022-01-29 NOTE — Telephone Encounter (Signed)
I attempted to contact patient to discuss. Left call back number.

## 2022-01-29 NOTE — Telephone Encounter (Signed)
Patient scheduled to come in next Friday. She had a cardiac cath procedure and had "had something on her arm that came off" advised to call Dr. Ali Lowe at cardiology, phone number provided

## 2022-01-29 NOTE — Telephone Encounter (Signed)
Patient is requesting to speak with Dr. Dara Hoyer nurse regarding 10/25 procedure.

## 2022-02-05 ENCOUNTER — Encounter: Payer: Self-pay | Admitting: Family

## 2022-02-05 ENCOUNTER — Ambulatory Visit (INDEPENDENT_AMBULATORY_CARE_PROVIDER_SITE_OTHER): Payer: Medicare PPO | Admitting: Family

## 2022-02-05 VITALS — BP 125/85 | HR 79 | Temp 98.5°F | Ht 64.0 in | Wt 193.6 lb

## 2022-02-05 DIAGNOSIS — N760 Acute vaginitis: Secondary | ICD-10-CM | POA: Diagnosis not present

## 2022-02-05 DIAGNOSIS — N189 Chronic kidney disease, unspecified: Secondary | ICD-10-CM | POA: Diagnosis not present

## 2022-02-05 DIAGNOSIS — I10 Essential (primary) hypertension: Secondary | ICD-10-CM

## 2022-02-05 DIAGNOSIS — E039 Hypothyroidism, unspecified: Secondary | ICD-10-CM

## 2022-02-05 DIAGNOSIS — R413 Other amnesia: Secondary | ICD-10-CM

## 2022-02-05 DIAGNOSIS — K219 Gastro-esophageal reflux disease without esophagitis: Secondary | ICD-10-CM | POA: Diagnosis not present

## 2022-02-05 DIAGNOSIS — E118 Type 2 diabetes mellitus with unspecified complications: Secondary | ICD-10-CM | POA: Diagnosis not present

## 2022-02-05 DIAGNOSIS — N643 Galactorrhea not associated with childbirth: Secondary | ICD-10-CM | POA: Diagnosis not present

## 2022-02-05 DIAGNOSIS — R350 Frequency of micturition: Secondary | ICD-10-CM | POA: Diagnosis not present

## 2022-02-05 DIAGNOSIS — I25118 Atherosclerotic heart disease of native coronary artery with other forms of angina pectoris: Secondary | ICD-10-CM | POA: Diagnosis not present

## 2022-02-05 LAB — HEMOGLOBIN A1C: Hgb A1c MFr Bld: 6.7 % — ABNORMAL HIGH (ref 4.6–6.5)

## 2022-02-05 LAB — TSH: TSH: 3.19 u[IU]/mL (ref 0.35–5.50)

## 2022-02-05 MED ORDER — FLUCONAZOLE 150 MG PO TABS: ORAL_TABLET | ORAL | 0 refills | Status: DC

## 2022-02-05 NOTE — Assessment & Plan Note (Signed)
Trial of diflucan.  

## 2022-02-05 NOTE — Assessment & Plan Note (Addendum)
Will refer to nephrology for further evaluation.

## 2022-02-05 NOTE — Assessment & Plan Note (Addendum)
S/p cardiac cath on 01/27/22. Has follow up scheduled next week with Dr. Harriet Masson.   Results as follows:  1.  Mild obstructive coronary artery disease. 2.  LVEDP 19mmHg. 3.  Occluded right radial artery. 4.  CFR of 2.6 with normal being greater than 2.5 and IMR of 50 with normal being greater less than 25.  This suggests that the patient has some degree of microvascular dysfunction.

## 2022-02-05 NOTE — Assessment & Plan Note (Signed)
Last visit we referred her to neurology for memory evaluation - but it looks like this didn't get scheduled. I have placed a new referral.

## 2022-02-05 NOTE — Assessment & Plan Note (Addendum)
BP Readings from Last 3 Encounters:  02/05/22 125/85  01/27/22 109/63  01/21/22 126/78   Initial BP was quite low.  Repeat bp was OK. She is not taking 1/2 tab of metoprolol- she is taking a whole tab. Advised pt to cut metoprolol in half.

## 2022-02-05 NOTE — Assessment & Plan Note (Signed)
Lab Results  Component Value Date   TSH 4.80 08/26/2021   Maintained on synthroid 25 mcg once daily. Check follow up TSH.

## 2022-02-05 NOTE — Assessment & Plan Note (Signed)
Will obtain A1C.   

## 2022-02-05 NOTE — Assessment & Plan Note (Signed)
New. Left breast, has resolved. Due for mammogram. Will check prolactin level, tsh and refer for diagnostic mammogram.

## 2022-02-05 NOTE — Progress Notes (Signed)
Subjective:   By signing my name below, I, Veronica Wolf, attest that this documentation has been prepared under the direction and in the presence of Karie Chimera, NP 02/05/2022   Patient ID: Veronica Wolf, female    DOB: Nov 18, 1940, 81 y.o.   MRN: [id number]  Chief Complaint  Patient presents with   Hospitalization Follow-up    Was in the ER last week sometime.  Pt is unsure of her medications     HPI Patient is in today for an office visit  ED Admission (Mid Sternal Chest Pain): She was admitted to the ED on 01/27/2022 for mid-sternal chest pain. She originally thought her symptoms were due to heartburn.  She was sent to the Cath Lab once she arrived to the ED. She saw Dr. Harriet Masson on 01/21/2022 for the same chest pain. She undergone an angioplasty procedure while at her time in the ED. Since the procedure she has felt fine. She denies of any chest pains at the moment.   Itching: She complains of itching all over her body, primarily under her breast. Her groin area is also sore. She does not believe that she has a yeast infection. She has chronic dry skin. She has previously had a rash under her breast but reports that symptoms are resolved. She states that the Nystatin Powder that she picked up from the pharmacy was a small size though the largest size was ordered.  Urinary Frequency: She reports of urinary frequency that occurred last week. She could not stay in bed due to the frequency. She states that as of today's visit, her symptoms are resolved.   Blood Pressure: Her blood pressure during today's visit is slightly low. She has not been cutting her 25 Mg of Metoprolol Succinate as previously advised. Her blood pressure during today's visit is 125/85. BP Readings from Last 3 Encounters:  02/05/22 125/85  01/27/22 109/63  01/21/22 126/78   Pulse Readings from Last 3 Encounters:  02/05/22 79  01/27/22 70  01/21/22 69   Blood Sugars: She reports that she has been consuming  a lot of sugar. She is drinking an adequate amount of water. She is also drinking a lot of ginger ale Lab Results  Component Value Date   HGBA1C 6.5 01/09/2021   Left Nipple Leakage: She reports of left nipple leakage that occurred just prior to her ED admission.   Heartburn: She reports that her heartburn symptoms are controlled.   Breathing: She reports that her breathing is controlled.   Mood: She reports that she has not been the same since her brother's passing.   Nephrologist: She has not been seeing a nephrologist.   Mammogram: She reports that she has not had a mammogram in about two years.   Health Maintenance Due  Topic Date Due   FOOT EXAM  Never done   OPHTHALMOLOGY EXAM  Never done   Zoster Vaccines- Shingrix (1 of 2) Never done   COVID-19 Vaccine (3 - Pfizer risk series) 07/06/2019   HEMOGLOBIN A1C  07/10/2021   INFLUENZA VACCINE  11/03/2021   Diabetic kidney evaluation - Urine ACR  03/05/2022    Past Medical History:  Diagnosis Date   Allergic rhinitis    Allergy    seasonal allergies   Anxiety    CKD (chronic kidney disease) 04/04/2017   COPD (chronic obstructive pulmonary disease) (HCC)    uses inhaler   Depression    Fatty liver    GERD (gastroesophageal reflux disease)  Gout    hx of   Hyperlipidemia    on meds   Hypertension    on meds   Myocardial infarction [facility])    Peptic ulcer 01/04/2003   Vertigo     Past Surgical History:  Procedure Laterality Date   ABDOMINAL HYSTERECTOMY     APPENDECTOMY     BIOPSY  08/13/2020   Procedure: BIOPSY;  Surgeon: Lavena Bullion, DO;  Location: [facility];  Service: Gastroenterology;;   ESOPHAGOGASTRODUODENOSCOPY (EGD) WITH PROPOFOL N/A 08/13/2020   Procedure: ESOPHAGOGASTRODUODENOSCOPY (EGD) WITH PROPOFOL;  Surgeon: Lavena Bullion, DO;  Location: [facility];  Service: Gastroenterology;  Laterality: N/A;   INTRAVASCULAR PRESSURE WIRE/FFR STUDY N/A 01/27/2022   Procedure: INTRAVASCULAR  PRESSURE WIRE/FFR STUDY;  Surgeon: Early Osmond, MD;  Location: [place] [facility];  Service: Cardiovascular;  Laterality: N/A;   LEFT HEART CATH AND CORONARY ANGIOGRAPHY N/A 06/13/2017   Procedure: LEFT HEART CATH AND CORONARY ANGIOGRAPHY;  Surgeon: Leonie Man, MD;  Location: [place] [facility];  Service: Cardiovascular;  Laterality: N/A;   LEFT HEART CATH AND CORONARY ANGIOGRAPHY N/A 01/27/2022   Procedure: LEFT HEART CATH AND CORONARY ANGIOGRAPHY;  Surgeon: Early Osmond, MD;  Location: [place] [facility];  Service: Cardiovascular;  Laterality: N/A;   RIGHT/LEFT HEART CATH AND CORONARY ANGIOGRAPHY N/A 09/16/2017   Procedure: RIGHT/LEFT HEART CATH AND CORONARY ANGIOGRAPHY;  Surgeon: Martinique, Peter M, MD;  Location: [place] [facility];  Service: Cardiovascular;  Laterality: N/A;   SAVORY DILATION N/A 08/13/2020   Procedure: SAVORY DILATION;  Surgeon: Lavena Bullion, DO;  Location: [facility];  Service: Gastroenterology;  Laterality: N/A;   ULTRASOUND GUIDANCE FOR VASCULAR ACCESS  09/16/2017   Procedure: Ultrasound Guidance For Vascular Access;  Surgeon: Martinique, Peter M, MD;  Location: [place] [facility];  Service: Cardiovascular;;   WISDOM TOOTH EXTRACTION      Family History  Problem Relation Age of Onset   Breast cancer Mother    Stroke Father    Stomach cancer Neg Hx    Colon cancer Neg Hx    Pancreatic cancer Neg Hx    Esophageal cancer Neg Hx    Colon polyps Neg Hx    Rectal cancer Neg Hx     Social History   Socioeconomic History   Marital status: Widowed    Spouse name: Not on file   Number of children: 1   Years of education: 61   Highest education level: 12th grade  Occupational History   Occupation: Retired  Tobacco Use   Smoking status: Former    Types: Cigarettes    Quit date: 04/24/2000    Years since quitting: 21.8    Passive exposure: Past   Smokeless tobacco: Never  Vaping Use   Vaping Use: Never used  Substance and Sexual Activity   Alcohol  use: Not Currently    Comment: has previous hx of ETOH abuse, quit 2014   Drug use: No   Sexual activity: Not Currently  Other Topics Concern   Not on file  Social History Narrative   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[place]   Grew up in [place]   Has daughter locally   Social Determinants of Health   Financial Resource Strain: [place]  (10/07/2021)   Overall Financial Resource Strain (CARDIA)    Difficulty of Paying Living Expenses: Not very hard  Food Insecurity: No Food Insecurity (05/13/2021)   Hunger Vital Sign    Worried About Running  Out of Food in the Last Year: Never true    Ran Out of Food in the Last Year: Never true  Transportation Needs: No Transportation Needs (05/13/2021)   PRAPARE - Hydrologist (Medical): No    Lack of Transportation (Non-Medical): No  Recent Concern: Transportation Needs - Unmet Transportation Needs (03/09/2021)   PRAPARE - Transportation    Lack of Transportation (Medical): Yes    Lack of Transportation (Non-Medical): Yes  Physical Activity: Inactive (05/13/2021)   Exercise Vital Sign    Days of Exercise per Week: 0 days    Minutes of Exercise per Session: 0 min  Stress: Stress Concern Present (05/13/2021)   [place]    Feeling of Stress : Rather much  Social Connections: Moderately Isolated (05/13/2021)   Social Connection and Isolation Panel [NHANES]    Frequency of Communication with Friends and Family: More than three times a week    Frequency of Social Gatherings with Friends and Family: More than three times a week    Attends Religious Services: 1 to 4 times per year    Active Member of [employer] or Organizations: No    Attends Archivist Meetings: Never    Marital Status: Widowed  Intimate Partner Violence: Not At Risk (05/13/2021)   Humiliation, Afraid, Rape, and Kick questionnaire    Fear of Current or Ex-Partner: No    Emotionally  Abused: No    Physically Abused: No    Sexually Abused: No    Outpatient Medications Prior to Visit  Medication Sig Dispense Refill   albuterol (PROVENTIL) (2.5 MG/3ML) 0.083% nebulizer solution USE 1 VIAL IN NEBULIZER EVERY 6 HOURS AS NEEDED FOR WHEEZING AND FOR SHORTNESS OF BREATH 150 mL 0   albuterol (VENTOLIN HFA) 108 (90 Base) MCG/ACT inhaler Inhale 2 puffs into the lungs every 6 (six) hours as needed for wheezing or shortness of breath. 8 g 2   allopurinol (ZYLOPRIM) 100 MG tablet TAKE TWO TABLETS BY MOUTH ONCE DAILY 180 tablet 1   aspirin 81 MG EC tablet Take 1 tablet (81 mg total) by mouth daily. Swallow whole. 30 tablet 12   atorvastatin (LIPITOR) 80 MG tablet TAKE ONE TABLET BY MOUTH EVERYDAY AT BEDTIME 90 tablet 3   chlorhexidine (PERIDEX) 0.12 % solution SMARTSIG:0.5 Ounce(s) By Mouth Twice Daily     colchicine 0.6 MG tablet Take 0.6 mg by mouth daily as needed (gout).     diazepam (VALIUM) 10 MG tablet TAKE ONE TABLET BY MOUTH EVERY TWELVE HOURS AS NEEDED FOR ANXIETY 45 tablet 0   dicyclomine (BENTYL) 10 MG capsule Take 1 capsule (10 mg total) by mouth in the morning, at noon, in the evening, and at bedtime. (Patient not taking: Reported on 01/28/2022) 360 capsule 1   escitalopram (LEXAPRO) 20 MG tablet TAKE ONE TABLET BY MOUTH EVERY MORNING 90 tablet 1   ezetimibe (ZETIA) 10 MG tablet TAKE ONE TABLET BY MOUTH ONCE DAILY 90 tablet 3   famotidine (PEPCID) 20 MG tablet Take 1 tablet (20 mg total) by mouth at bedtime. 90 tablet 1   isosorbide mononitrate (IMDUR) 60 MG 24 hr tablet Take 1 tablet (60 mg total) by mouth daily. 90 tablet 3   levothyroxine (SYNTHROID) 25 MCG tablet TAKE ONE TABLET BY MOUTH EVERY MORNING 90 tablet 1   lisinopril (ZESTRIL) 2.5 MG tablet TAKE ONE TABLET BY MOUTH EVERYDAY AT BEDTIME 90 tablet 3   meclizine (ANTIVERT) 25 MG  tablet TAKE ONE TABLET BY MOUTH twice daily AS NEEDED FOR DIZZINESS 30 tablet 1   metoprolol succinate (TOPROL-XL) 25 MG 24 hr tablet Take  0.5 tablets by mouth daily.     nitroGLYCERIN (NITROSTAT) 0.4 MG SL tablet Place 1 tablet (0.4 mg total) under the tongue every 5 (five) minutes as needed for chest pain. 45 tablet 3   nystatin (MYCOSTATIN/NYSTOP) powder APPLY powder TOPICALLY TO THE AFFECTED AREA(S) TWICE DAILY 60 g 4   pantoprazole (PROTONIX) 40 MG tablet TAKE ONE TABLET BY MOUTH TWICE DAILY 180 tablet 0   potassium chloride SA (KLOR-CON M) 20 MEQ tablet TAKE ONE TABLET BY MOUTH AT NOON 90 tablet 1   Respiratory Therapy Supplies (NEBULIZER/TUBING/MOUTHPIECE) KIT Use as directed 1 kit 0   sucralfate (CARAFATE) 1 g tablet TAKE ONE TABLET BY MOUTH FOUR TIMES DAILY. MAY cut pill in half AND DISSOLVE in water prior TO drinking (Patient not taking: Reported on 01/28/2022) 120 tablet 0   No facility-administered medications prior to visit.    Allergies  Allergen Reactions   Ibuprofen Other (See Comments)    Pt has hx of peptic ulcer and diverticulitis.     Review of Systems  Constitutional:        (+) Left Breast Nipple Leakage  Cardiovascular:  Negative for chest pain.  Skin:  Positive for itching.       (+) Soreness (Between Upper Thighs)       Objective:    Physical Exam Constitutional:      General: She is not in acute distress.    Appearance: Normal appearance. She is not ill-appearing.  HENT:     Head: Normocephalic and atraumatic.     Right Ear: External ear normal.     Left Ear: External ear normal.  Eyes:     Extraocular Movements: Extraocular movements intact.     Pupils: Pupils are equal, round, and reactive to light.  Cardiovascular:     Rate and Rhythm: Normal rate and regular rhythm.     Heart sounds: Normal heart sounds. No murmur heard.    No gallop.  Pulmonary:     Effort: Pulmonary effort is normal. No respiratory distress.     Breath sounds: Normal breath sounds. No wheezing or rales.  Chest:  Breasts:    Breasts are symmetrical.     Right: No inverted nipple, mass or nipple discharge.      Left: No inverted nipple, mass or nipple discharge.  Skin:    General: Skin is warm and dry.     Comments: Fungal rash beneath both breast left greater than right  Neurological:     Mental Status: She is alert.     Comments: Frequently repeating same question/complaint throughout the visit  Psychiatric:        Mood and Affect: Mood normal.        Behavior: Behavior normal.        Judgment: Judgment normal.    BP 125/85   Pulse 79   Temp 98.5 F (36.9 C) (Oral)   Ht _0  (1.626 m)   Wt 193 lb 9.6 oz (87.8 kg)   SpO2 96%   BMI 33.23 kg/m  Wt Readings from Last 3 Encounters:  02/05/22 193 lb 9.6 oz (87.8 kg)  01/27/22 190 lb (86.2 kg)  01/21/22 191 lb 6.4 oz (86.8 kg)       Assessment & Plan:   Problem List Items Addressed This Visit       Unprioritized  Memory loss    Last visit we referred her to neurology for memory evaluation - but it looks like this didn't get scheduled. I have placed a new referral.       Relevant Orders   Ambulatory referral to Neurology   Hypothyroid    Lab Results  Component Value Date   TSH 4.80 08/26/2021  Maintained on synthroid 25 mcg once daily. Check follow up TSH.       Relevant Orders   TSH   Hypertension    BP Readings from Last 3 Encounters:  02/05/22 125/85  01/27/22 109/63  01/21/22 126/78  Initial BP was quite low.  Repeat bp was OK. She is not taking 1/2 tab of metoprolol- she is taking a whole tab. Advised pt to cut metoprolol in half.       GERD (gastroesophageal reflux disease)    Stable on pantoprazole.       Galactorrhea    New. Left breast, has resolved. Due for mammogram. Will check prolactin level, tsh and refer for diagnostic mammogram.      Relevant Orders   MM Digital Diagnostic Bilat   Prolactin   Coronary artery disease involving native coronary artery of native heart - Primary    S/p cardiac cath on 01/27/22. Has follow up scheduled next week with Dr. Harriet Masson.   Results as follows:  1.   Mild obstructive coronary artery disease. 2.  LVEDP 32mHg. 3.  Occluded right radial artery. 4.  CFR of 2.6 with normal being greater than 2.5 and IMR of 50 with normal being greater less than 25.  This suggests that the patient has some degree of microvascular dysfunction.      Controlled type 2 diabetes mellitus with complication, without long-term current use of insulin ([place]    Will obtain A1C.        Relevant Orders   Hemoglobin A1c   CKD (chronic kidney disease)    Will refer to nephrology for further evaluation.        Relevant Orders   Ambulatory referral to Nephrology   Acute vaginitis    Trial of diflucan.       Relevant Medications   fluconazole (DIFLUCAN) 150 MG tablet   Other Visit Diagnoses     Urinary frequency       Relevant Orders   Urine Culture     She will check with her pharmacy if she had a flu shot- if not, I have advised her to get one.    Meds ordered this encounter  Medications   fluconazole (DIFLUCAN) 150 MG tablet    Sig: Take 1 tablet by mouth now and then again in 3 days    Dispense:  2 tablet    Refill:  0    Order Specific Question:   Supervising Provider    Answer:   BPenni HomansA [[id number]]   50 minutes spent on today's visit. Time was spent counseling pt on medical plan as above and reviewing medical record.   I, MNance Pear NP, personally preformed the services described in this documentation.  All medical record entries made by the scribe were at my direction and in my presence.  I have reviewed the chart and discharge instructions (if applicable) and agree that the record reflects my personal performance and is accurate and complete. 02/05/2022   I,Amber Collins,acting as a scribe for MNance Pear NP.,have documented all relevant documentation on the behalf of MNance Pear NP,as directed by  Nance Pear, NP while in the presence of Nance Pear, NP.    Nance Pear, NP

## 2022-02-05 NOTE — Assessment & Plan Note (Signed)
Stable on pantoprazole

## 2022-02-05 NOTE — Patient Instructions (Addendum)
Make sure you are cutting your metoprolol tablet in half and only taking 1/2 tab once daily.

## 2022-02-06 LAB — PROLACTIN: Prolactin: 31.6 ng/mL — ABNORMAL HIGH

## 2022-02-09 ENCOUNTER — Telehealth: Payer: Self-pay | Admitting: *Deleted

## 2022-02-09 ENCOUNTER — Other Ambulatory Visit: Payer: Medicare PPO

## 2022-02-09 DIAGNOSIS — R350 Frequency of micturition: Secondary | ICD-10-CM | POA: Diagnosis not present

## 2022-02-09 NOTE — Telephone Encounter (Signed)
Pt was in the office on 11/3 and was unable to urinate for her urine culture.  Pt said she would return the urine on Friday but did not. I spoke to pt this morning and she said she would return it today but has not as of yet.  I reordered it as future and wanted to make you aware.

## 2022-02-09 NOTE — Addendum Note (Signed)
Addended by: Kelle Darting A on: 02/09/2022 08:34 AM   Modules accepted: Orders

## 2022-02-10 ENCOUNTER — Telehealth: Payer: Self-pay | Admitting: Family

## 2022-02-10 DIAGNOSIS — R7989 Other specified abnormal findings of blood chemistry: Secondary | ICD-10-CM | POA: Insufficient documentation

## 2022-02-10 DIAGNOSIS — N643 Galactorrhea not associated with childbirth: Secondary | ICD-10-CM

## 2022-02-10 LAB — URINE CULTURE
MICRO NUMBER:: 14154828
SPECIMEN QUALITY:: ADEQUATE

## 2022-02-10 NOTE — Telephone Encounter (Signed)
Appointment with Dr.Tobb on 11/09

## 2022-02-10 NOTE — Telephone Encounter (Signed)
Prolactin level is elevated. This can cause breast leakage like she reported.  I would like to have her complete an MRI of her brain to make sure that she does not have a growth on her pituitary gland that is causing this prolactin elevation.

## 2022-02-10 NOTE — Telephone Encounter (Signed)
Patient advised of results and getting MRI. She verbalized understanding.

## 2022-02-11 ENCOUNTER — Ambulatory Visit: Payer: Medicare PPO | Attending: Cardiology | Admitting: Cardiology

## 2022-02-11 ENCOUNTER — Encounter: Payer: Self-pay | Admitting: Cardiology

## 2022-02-11 VITALS — BP 126/70 | HR 82 | Ht 64.0 in | Wt 192.0 lb

## 2022-02-11 DIAGNOSIS — I2510 Atherosclerotic heart disease of native coronary artery without angina pectoris: Secondary | ICD-10-CM | POA: Diagnosis not present

## 2022-02-11 DIAGNOSIS — I272 Pulmonary hypertension, unspecified: Secondary | ICD-10-CM

## 2022-02-11 DIAGNOSIS — E118 Type 2 diabetes mellitus with unspecified complications: Secondary | ICD-10-CM | POA: Diagnosis not present

## 2022-02-11 DIAGNOSIS — N183 Chronic kidney disease, stage 3 unspecified: Secondary | ICD-10-CM | POA: Diagnosis not present

## 2022-02-11 IMAGING — CT CT ABD-PELV W/ CM
2 of 5 series · 16 of 46 positions shown, 18 images · IV contrast (omnipaque)
Comparison: CT March 20, 2020

CLINICAL DATA: Abdominal distension left upper quadrant pain

EXAM:
CT ABDOMEN AND PELVIS WITH CONTRAST
TECHNIQUE: Multidetector CT imaging of the abdomen and pelvis was performed
using the standard protocol following bolus administration of
intravenous contrast.
CONTRAST:  75mL OMNIPAQUE IOHEXOL 300 MG/ML  SOLN

[Series 2: axial st · axial · 0.86mm/px · z∈[+868,+1244]mm · 13 of 87 slices shown, 15 images]
[im 6/87  soft-tissue]
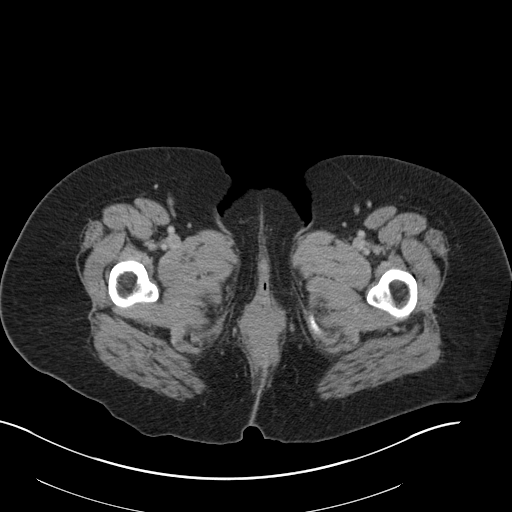
[im 6/87  bone]
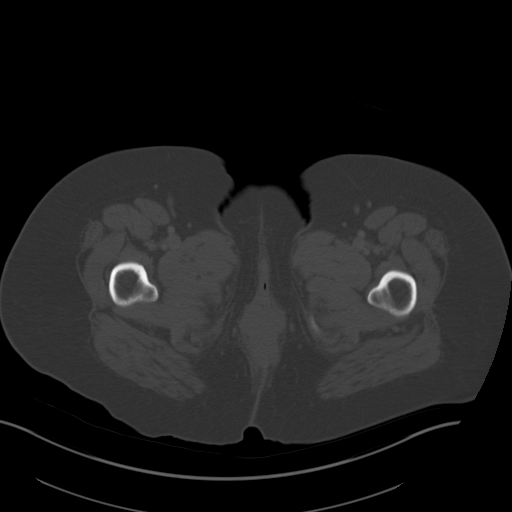
[im 11/87  soft-tissue]
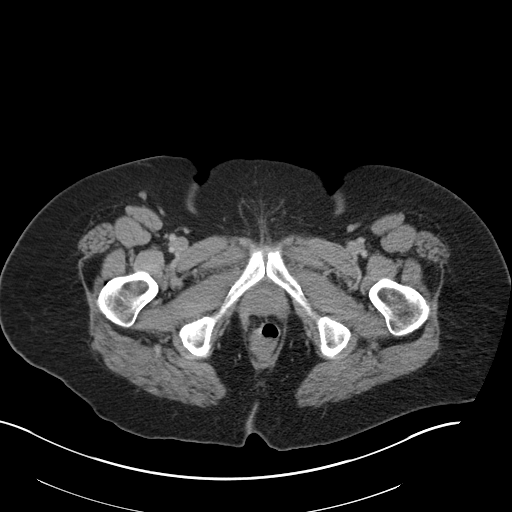
[im 17/87  soft-tissue]
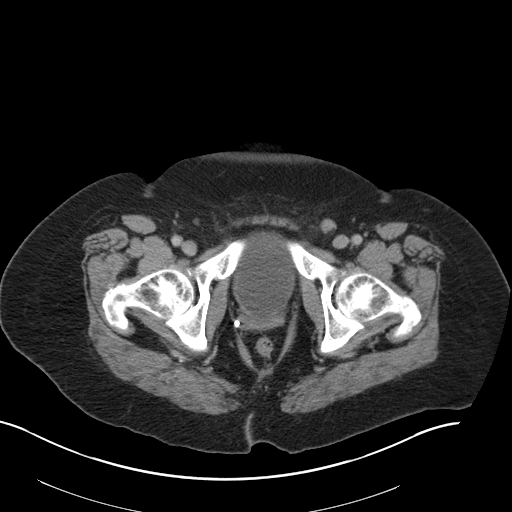
[im 27/87  soft-tissue]
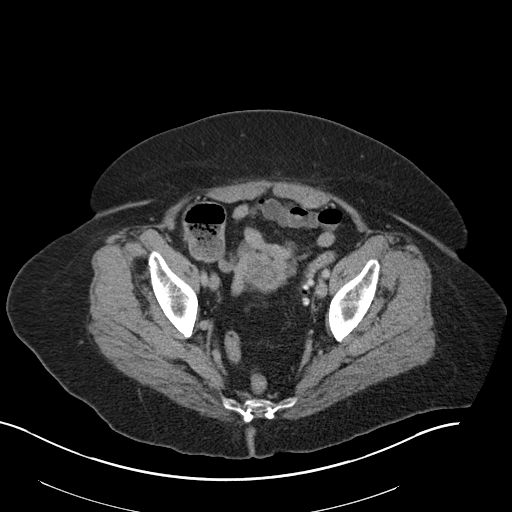
[im 33/87  soft-tissue]
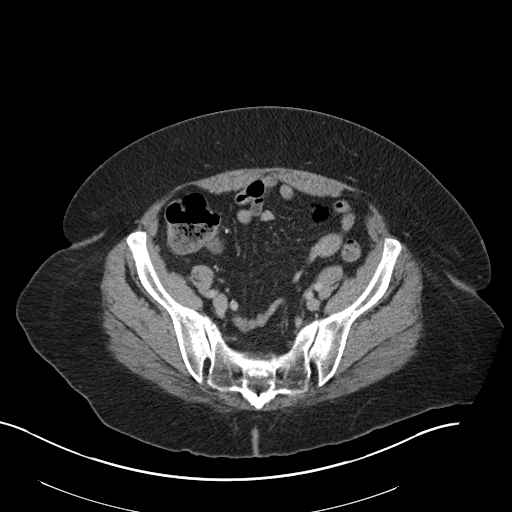
[im 38/87  soft-tissue]
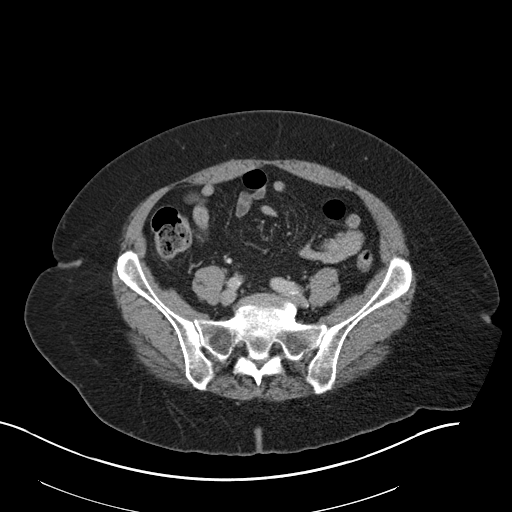
[im 44/87  soft-tissue]
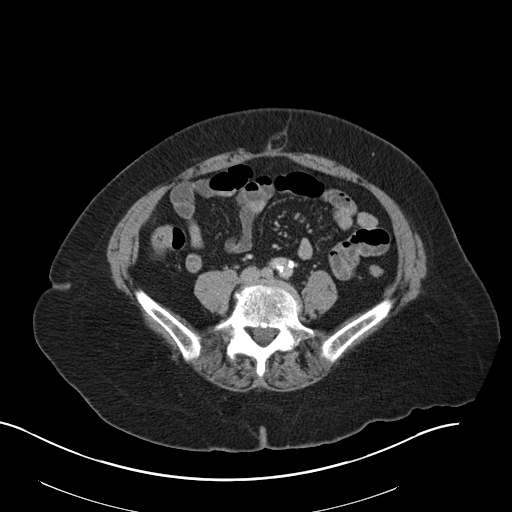
[im 49/87  soft-tissue]
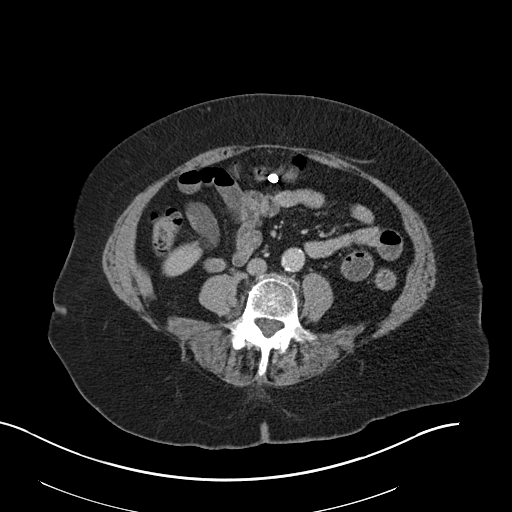
[im 54/87  soft-tissue]
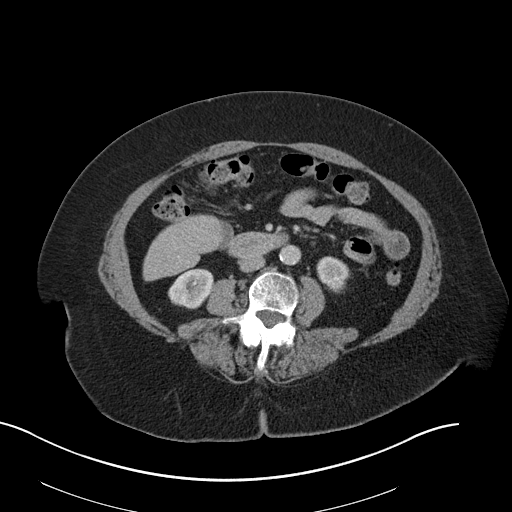
[im 54/87  bone]
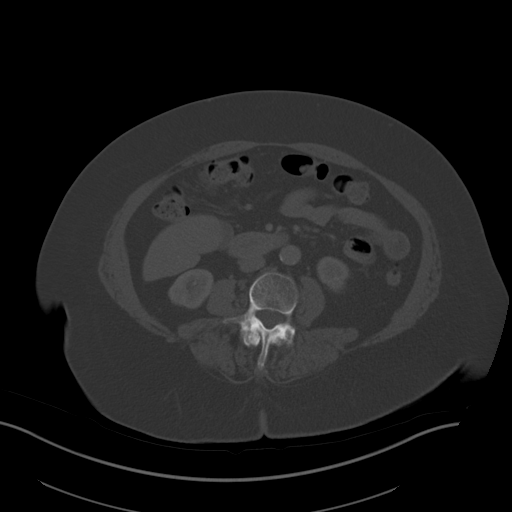
[im 60/87  soft-tissue]
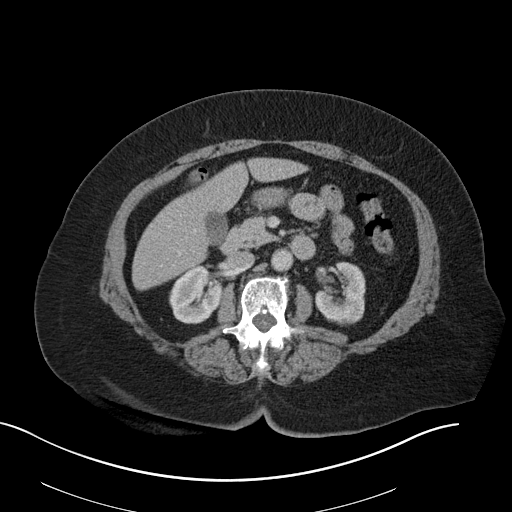
[im 70/87  soft-tissue]
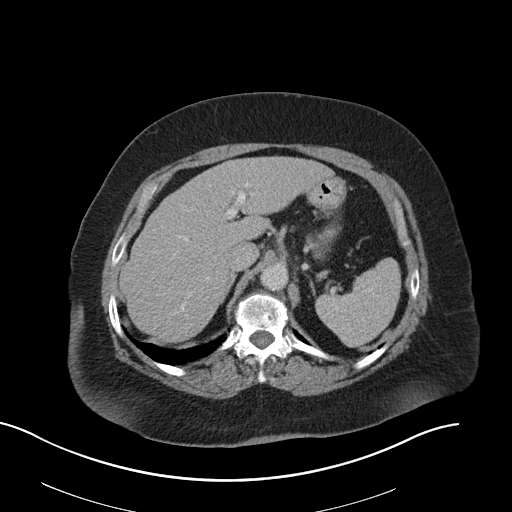
[im 76/87  soft-tissue]
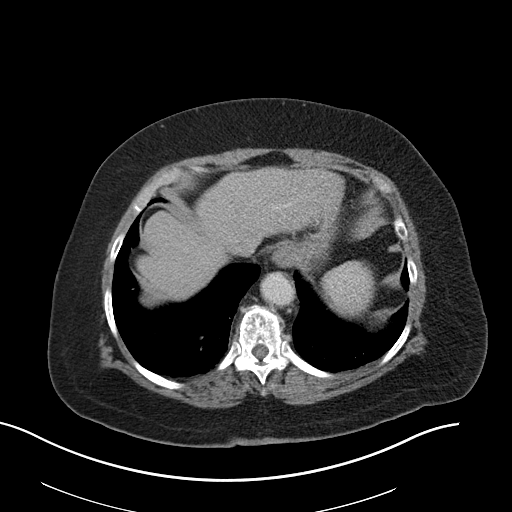
[im 81/87  soft-tissue]
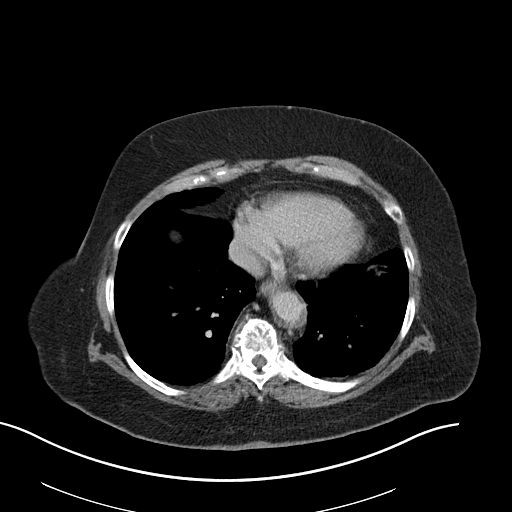

[Series 4: coronal st · coronal · 0.82mm/px · 3 of 155 slices shown]
[im 52/155  soft-tissue]
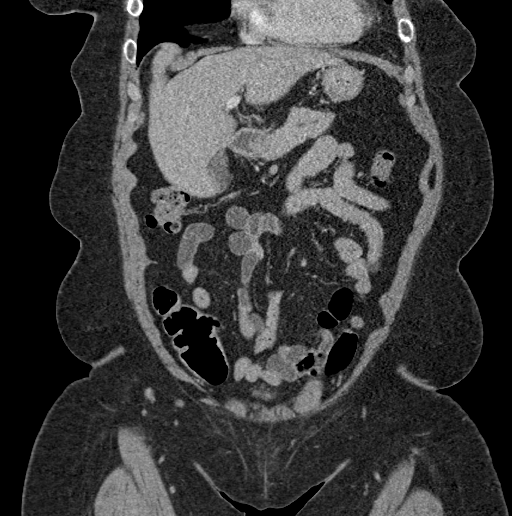
[im 69/155  soft-tissue]
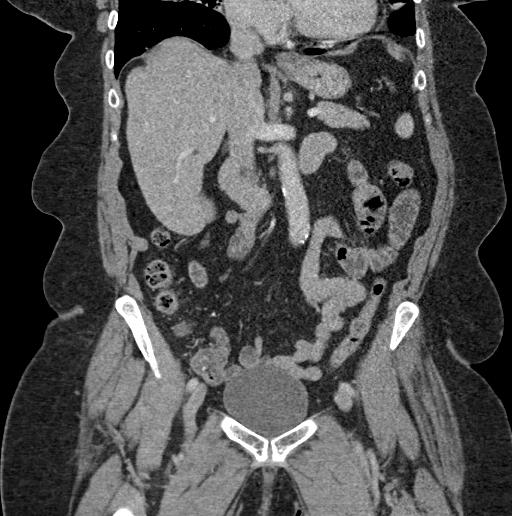
[im 86/155  soft-tissue]
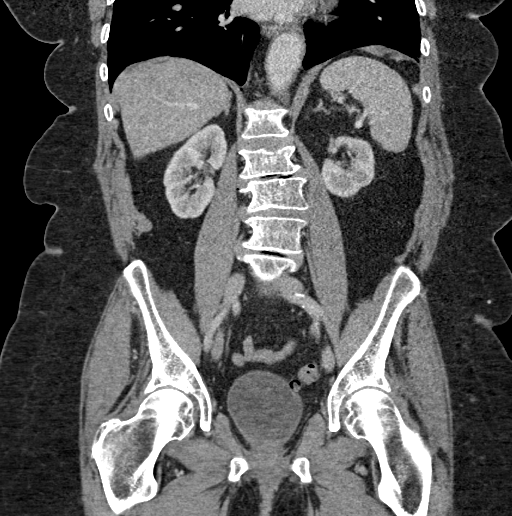

[16 of 46 positions shown; findings below may reference images not displayed]

FINDINGS: Lower chest: The visualized heart size within normal limits. No
pericardial fluid/thickening.

No hiatal hernia.

The visualized portions of the lungs are clear.

Hepatobiliary: There is diffuse low density seen throughout the
liver parenchyma. No evidence of calcified gallstones or biliary
ductal dilatation.

Pancreas:  Unremarkable.  No surrounding inflammatory changes.

Spleen: Normal in size. Although limited due to the lack of
intravenous contrast, normal in appearance.

Adrenals/Urinary Tract: Both adrenal glands appear normal. The
kidneys and collecting system appear normal without evidence of
urinary tract calculus or hydronephrosis. Bladder is unremarkable.

Stomach/Bowel: The stomach, small bowel, and colon are normal in
appearance. No inflammatory changes or obstructive findings.
Scattered colonic diverticula are noted without surrounding
inflammatory changes.

Vascular/Lymphatic: There are no enlarged abdominal or pelvic lymph
nodes. Scattered aortic atherosclerotic calcifications are seen
without aneurysmal dilatation.

Reproductive: The patient is status post hysterectomy. No adnexal
masses or collections seen.

Other: No evidence of abdominal wall mass or hernia.

Musculoskeletal: No acute or significant osseous findings. Slight
superior compression deformity of the L2 vertebral bodies seen with
less than 25% loss in height.
IMPRESSION: No acute intra-abdominal or pelvic pathology to explain the
patient's symptoms

Mild hepatic steatosis

Diverticulosis without diverticulitis

Aortic Atherosclerosis ([id number]).

## 2022-02-11 NOTE — Progress Notes (Signed)
Cardiology Office Note:    Date:  02/15/2022   ID:  [name], DOB [birth year], MRN [id number]  PCP:  Debbrah Alar, NP  Cardiologist:  Berniece Salines, DO  Electrophysiologist:  None   Referring MD: Debbrah Alar, NP   " I am having chest pain"  History of Present Illness:    Veronica Wolf is a 81 y.o. female with a hx of of coronary artery disease and coronary angiography revealed mild coronary artery disease in 2019, Mixed hyperlipidemia, CKD, hypertension.   I saw the patient on 09/17/2020 at that she was experiencing chest pain.During that visit I recommended a nuclear stress. She was not able to get this testing done.  At her last visit she was experiencing was experiencing chest discomfort.  I discussed with the patient about getting further testing but she had declined.  Therefore started her on antianginals with Imdur.  She still is experiencing chest pain.  Sent her for cath recently, mild nonobstructive disease.  Past Medical History:  Diagnosis Date   Allergic rhinitis    Allergy    seasonal allergies   Anxiety    CKD (chronic kidney disease) 04/04/2017   COPD (chronic obstructive pulmonary disease) (HCC)    uses inhaler   Depression    Fatty liver    GERD (gastroesophageal reflux disease)    Gout    hx of   Hyperlipidemia    on meds   Hypertension    on meds   Myocardial infarction [facility])    Peptic ulcer 01/04/2003   Vertigo     Past Surgical History:  Procedure Laterality Date   ABDOMINAL HYSTERECTOMY     APPENDECTOMY     BIOPSY  08/13/2020   Procedure: BIOPSY;  Surgeon: Lavena Bullion, DO;  Location: [facility];  Service: Gastroenterology;;   ESOPHAGOGASTRODUODENOSCOPY (EGD) WITH PROPOFOL N/A 08/13/2020   Procedure: ESOPHAGOGASTRODUODENOSCOPY (EGD) WITH PROPOFOL;  Surgeon: Lavena Bullion, DO;  Location: [facility];  Service: Gastroenterology;  Laterality: N/A;   INTRAVASCULAR PRESSURE WIRE/FFR STUDY N/A 01/27/2022   Procedure:  INTRAVASCULAR PRESSURE WIRE/FFR STUDY;  Surgeon: Early Osmond, MD;  Location: [place] [facility];  Service: Cardiovascular;  Laterality: N/A;   LEFT HEART CATH AND CORONARY ANGIOGRAPHY N/A 06/13/2017   Procedure: LEFT HEART CATH AND CORONARY ANGIOGRAPHY;  Surgeon: Leonie Man, MD;  Location: [place] [facility];  Service: Cardiovascular;  Laterality: N/A;   LEFT HEART CATH AND CORONARY ANGIOGRAPHY N/A 01/27/2022   Procedure: LEFT HEART CATH AND CORONARY ANGIOGRAPHY;  Surgeon: Early Osmond, MD;  Location: [place] [facility];  Service: Cardiovascular;  Laterality: N/A;   RIGHT/LEFT HEART CATH AND CORONARY ANGIOGRAPHY N/A 09/16/2017   Procedure: RIGHT/LEFT HEART CATH AND CORONARY ANGIOGRAPHY;  Surgeon: Martinique, Peter M, MD;  Location: [place] [facility];  Service: Cardiovascular;  Laterality: N/A;   SAVORY DILATION N/A 08/13/2020   Procedure: SAVORY DILATION;  Surgeon: Lavena Bullion, DO;  Location: [facility];  Service: Gastroenterology;  Laterality: N/A;   ULTRASOUND GUIDANCE FOR VASCULAR ACCESS  09/16/2017   Procedure: Ultrasound Guidance For Vascular Access;  Surgeon: Martinique, Peter M, MD;  Location: [place] [facility];  Service: Cardiovascular;;   WISDOM TOOTH EXTRACTION      Current Medications: Current Meds  Medication Sig   albuterol (PROVENTIL) (2.5 MG/3ML) 0.083% nebulizer solution USE 1 VIAL IN NEBULIZER EVERY 6 HOURS AS NEEDED FOR WHEEZING AND FOR SHORTNESS OF BREATH   albuterol (VENTOLIN HFA) 108 (90 Base) MCG/ACT inhaler  Inhale 2 puffs into the lungs every 6 (six) hours as needed for wheezing or shortness of breath.   allopurinol (ZYLOPRIM) 100 MG tablet TAKE TWO TABLETS BY MOUTH ONCE DAILY   aspirin 81 MG EC tablet Take 1 tablet (81 mg total) by mouth daily. Swallow whole.   atorvastatin (LIPITOR) 80 MG tablet TAKE ONE TABLET BY MOUTH EVERYDAY AT BEDTIME   chlorhexidine (PERIDEX) 0.12 % solution SMARTSIG:0.5 Ounce(s) By Mouth Twice Daily   colchicine 0.6 MG tablet Take  0.6 mg by mouth daily as needed (gout).   diazepam (VALIUM) 10 MG tablet TAKE ONE TABLET BY MOUTH EVERY TWELVE HOURS AS NEEDED FOR ANXIETY   dicyclomine (BENTYL) 10 MG capsule Take 1 capsule (10 mg total) by mouth in the morning, at noon, in the evening, and at bedtime.   escitalopram (LEXAPRO) 20 MG tablet TAKE ONE TABLET BY MOUTH EVERY MORNING   ezetimibe (ZETIA) 10 MG tablet TAKE ONE TABLET BY MOUTH ONCE DAILY   famotidine (PEPCID) 20 MG tablet Take 1 tablet (20 mg total) by mouth at bedtime.   fluconazole (DIFLUCAN) 150 MG tablet Take 1 tablet by mouth now and then again in 3 days   isosorbide mononitrate (IMDUR) 60 MG 24 hr tablet Take 1 tablet (60 mg total) by mouth daily.   levothyroxine (SYNTHROID) 25 MCG tablet TAKE ONE TABLET BY MOUTH EVERY MORNING   lisinopril (ZESTRIL) 2.5 MG tablet TAKE ONE TABLET BY MOUTH EVERYDAY AT BEDTIME   meclizine (ANTIVERT) 25 MG tablet TAKE ONE TABLET BY MOUTH twice daily AS NEEDED FOR DIZZINESS   metoprolol succinate (TOPROL-XL) 25 MG 24 hr tablet Take 0.5 tablets by mouth daily.   nitroGLYCERIN (NITROSTAT) 0.4 MG SL tablet Place 1 tablet (0.4 mg total) under the tongue every 5 (five) minutes as needed for chest pain.   nystatin (MYCOSTATIN/NYSTOP) powder APPLY powder TOPICALLY TO THE AFFECTED AREA(S) TWICE DAILY   pantoprazole (PROTONIX) 40 MG tablet TAKE ONE TABLET BY MOUTH TWICE DAILY   potassium chloride SA (KLOR-CON M) 20 MEQ tablet TAKE ONE TABLET BY MOUTH AT NOON   Respiratory Therapy Supplies (NEBULIZER/TUBING/MOUTHPIECE) KIT Use as directed   sucralfate (CARAFATE) 1 g tablet TAKE ONE TABLET BY MOUTH FOUR TIMES DAILY. MAY cut pill in half AND DISSOLVE in water prior TO drinking     Allergies:   Ibuprofen   Social History   Socioeconomic History   Marital status: Widowed    Spouse name: Not on file   Number of children: 1   Years of education: 54   Highest education level: 12th grade  Occupational History   Occupation: Retired  Tobacco  Use   Smoking status: Former    Types: Cigarettes    Quit date: 04/24/2000    Years since quitting: 21.8    Passive exposure: Past   Smokeless tobacco: Never  Vaping Use   Vaping Use: Never used  Substance and Sexual Activity   Alcohol use: Not Currently    Comment: has previous hx of ETOH abuse, quit 2014   Drug use: No   Sexual activity: Not Currently  Other Topics Concern   Not on file  Social History Narrative   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[place]   Grew up in [place]   Has daughter locally   Social Determinants of Health   Financial Resource Strain: [place]  (10/07/2021)   Overall Financial Resource Strain (CARDIA)    Difficulty of Paying Living Expenses: Not very hard  Food Insecurity: No  Food Insecurity (05/13/2021)   Hunger Vital Sign    Worried About Running Out of Food in the Last Year: Never true    Ran Out of Food in the Last Year: Never true  Transportation Needs: No Transportation Needs (05/13/2021)   PRAPARE - Hydrologist (Medical): No    Lack of Transportation (Non-Medical): No  Recent Concern: Transportation Needs - Unmet Transportation Needs (03/09/2021)   PRAPARE - Transportation    Lack of Transportation (Medical): Yes    Lack of Transportation (Non-Medical): Yes  Physical Activity: Inactive (05/13/2021)   Exercise Vital Sign    Days of Exercise per Week: 0 days    Minutes of Exercise per Session: 0 min  Stress: Stress Concern Present (05/13/2021)   [place]    Feeling of Stress : Rather much  Social Connections: Moderately Isolated (05/13/2021)   Social Connection and Isolation Panel [NHANES]    Frequency of Communication with Friends and Family: More than three times a week    Frequency of Social Gatherings with Friends and Family: More than three times a week    Attends Religious Services: 1 to 4 times per year    Active Member of [employer] or  Organizations: No    Attends Archivist Meetings: Never    Marital Status: Widowed     Family History: The patient's family history includes Breast cancer in her mother; Stroke in her father. There is no history of Stomach cancer, Colon cancer, Pancreatic cancer, Esophageal cancer, Colon polyps, or Rectal cancer.  ROS:   Review of Systems  Constitution: Negative for decreased appetite, fever and weight gain.  HENT: Negative for congestion, ear discharge, hoarse voice and sore throat.   Eyes: Negative for discharge, redness, vision loss in right eye and visual halos.  Cardiovascular: Negative for chest pain, dyspnea on exertion, leg swelling, orthopnea and palpitations.  Respiratory: Negative for cough, hemoptysis, shortness of breath and snoring.   Endocrine: Negative for heat intolerance and polyphagia.  Hematologic/Lymphatic: Negative for bleeding problem. Does not bruise/bleed easily.  Skin: Negative for flushing, nail changes, rash and suspicious lesions.  Musculoskeletal: Negative for arthritis, joint pain, muscle cramps, myalgias, neck pain and stiffness.  Gastrointestinal: Negative for abdominal pain, bowel incontinence, diarrhea and excessive appetite.  Genitourinary: Negative for decreased libido, genital sores and incomplete emptying.  Neurological: Negative for brief paralysis, focal weakness, headaches and loss of balance.  Psychiatric/Behavioral: Negative for altered mental status, depression and suicidal ideas.  Allergic/Immunologic: Negative for HIV exposure and persistent infections.    EKGs/Labs/Other Studies Reviewed:    The following studies were reviewed today:   EKG:  None today   LHC 2019 Prox RCA lesion is 20% stenosed. Post Atrio lesion is 20% stenosed. Hemodynamic findings consistent with moderate pulmonary hypertension. LV end diastolic pressure is moderately elevated.   1. No significant CAD 2. Moderate to severely elevated LV filling  pressures 3. Moderate pulmonary venous hypertension.  4. Normal cardiac output.  Plan: recommend IV diuresis. I suspect elevated troponin is due to demand ischemia in setting of decompensated heart failure.    TTE 2019  Study Conclusions  - Left ventricle: The cavity size was normal. Wall thickness was   normal. Systolic function was normal. The estimated ejection fraction was in the range of 55% to 60%. Mild hypokinesis of the    mid-apicallateral and inferolateral myocardium. Doppler   parameters are consistent with abnormal left ventricular  relaxation (grade 1 diastolic dysfunction).  - Pulmonary arteries: Systolic pressure was moderately increased.    PA peak pressure: 62 mm Hg (S).   Recent Labs: 11/06/2021: ALT 10 01/21/2022: BUN 16; Creatinine, Ser 1.62; Hemoglobin 12.6; Platelets 234; Potassium 4.9; Sodium 143 02/05/2022: TSH 3.19  Recent Lipid Panel    Component Value Date/Time   CHOL 127 08/26/2021 0905   TRIG 135.0 08/26/2021 0905   HDL 43.10 08/26/2021 0905   CHOLHDL 3 08/26/2021 0905   VLDL 27.0 08/26/2021 0905   LDLCALC 57 08/26/2021 0905   LDLCALC 108 (H) 03/14/2020 1439    Physical Exam:    VS:  BP 126/70   Pulse 82   Ht _0  (1.626 m)   Wt 192 lb (87.1 kg)   SpO2 96%   BMI 32.96 kg/m     Wt Readings from Last 3 Encounters:  02/11/22 192 lb (87.1 kg)  02/05/22 193 lb 9.6 oz (87.8 kg)  01/27/22 190 lb (86.2 kg)     GEN: Well nourished, well developed in no acute distress HEENT: Normal NECK: No JVD; No carotid bruits LYMPHATICS: No lymphadenopathy CARDIAC: S1S2 noted,RRR, no murmurs, rubs, gallops RESPIRATORY:  Clear to auscultation without rales, wheezing or rhonchi  ABDOMEN: Soft, non-tender, non-distended, +bowel sounds, no guarding. EXTREMITIES: No edema, No cyanosis, no clubbing MUSCULOSKELETAL:  No deformity  SKIN: Warm and dry NEUROLOGIC:  Alert and oriented x 3, non-focal PSYCHIATRIC:  Normal affect, good insight  ASSESSMENT:    1. Mild  CAD   2. Pulmonary hypertension, unspecified ([place])   3. Controlled type 2 diabetes mellitus with complication, without long-term current use of insulin (HCC)   4. Stage 3 chronic kidney disease, unspecified whether stage 3a or 3b CKD ([place])     PLAN:    She had a heart cath with that she will very mild obstructive CAD.  Continue the patient on her Imdur and she is doing well with this increased dose.  There is some suspicion of microvascular dysfunction.  Since she is doing well with the Imdur we will keep her on it for now.  Blood pressure at target.   HLN- continue her statin.  Obesity- continue with lifestyle modification. .   The patient is in agreement with the above plan. The patient left the office in stable condition.  The patient will follow up in 4 weeks post cath.   Medication Adjustments/Labs and Tests Ordered: Current medicines are reviewed at length with the patient today.  Concerns regarding medicines are outlined above.  No orders of the defined types were placed in this encounter.  No orders of the defined types were placed in this encounter.   Patient Instructions  Medication Instructions:  Your physician recommends that you continue on your current medications as directed. Please refer to the Current Medication list given to you today.  *If you need a refill on your cardiac medications before your next appointment, please call your pharmacy*   Lab Work: None ordered   If you have labs (blood work) drawn today and your tests are completely normal, you will receive your results only by: [place] (if you have MyChart) OR A paper copy in the mail If you have any lab test that is abnormal or we need to change your treatment, we will call you to review the results.   Testing/Procedures: None ordered    Follow-Up: At [facility], you and your health needs are our priority.  As part of our continuing mission  to provide you with exceptional  heart care, we have created designated Provider Care Teams.  These Care Teams include your primary Cardiologist (physician) and Advanced Practice Providers (APPs -  Physician Assistants and Nurse Practitioners) who all work together to provide you with the care you need, when you need it.  We recommend signing up for the patient portal called "MyChart".  Sign up information is provided on this After Visit Summary.  MyChart is used to connect with patients for Virtual Visits (Telemedicine).  Patients are able to view lab/test results, encounter notes, upcoming appointments, etc.  Non-urgent messages can be sent to your provider as well.   To learn more about what you can do with MyChart, go to [email or web address].    Your next appointment:   6 month(s)  The format for your next appointment:   In Person  Provider:   Berniece Salines, DO     Other Instructions   Important Information About Sugar         Adopting a Healthy Lifestyle.  Know what a healthy weight is for you (roughly BMI <25) and aim to maintain this   Aim for 7+ servings of fruits and vegetables daily   65-80+ fluid ounces of water or unsweet tea for healthy kidneys   Limit to max 1 drink of alcohol per day; avoid smoking/tobacco   Limit animal fats in diet for cholesterol and heart health - choose grass fed whenever available   Avoid highly processed foods, and foods high in saturated/trans fats   Aim for low stress - take time to unwind and care for your mental health   Aim for 150 min of moderate intensity exercise weekly for heart health, and weights twice weekly for bone health   Aim for 7-9 hours of sleep daily   When it comes to diets, agreement about the perfect plan isnt easy to find, even among the experts. Experts at the [employer] developed an idea known as the Healthy Eating Plate. Just imagine a plate divided into logical, healthy portions.   The emphasis is on diet  quality:   Load up on vegetables and fruits - one-half of your plate: Aim for color and variety, and remember that potatoes dont count.   Go for whole grains - one-quarter of your plate: Whole wheat, barley, wheat berries, quinoa, oats, brown rice, and foods made with them. If you want pasta, go with whole wheat pasta.   Protein power - one-quarter of your plate: Fish, chicken, beans, and nuts are all healthy, versatile protein sources. Limit red meat.   The diet, however, does go beyond the plate, offering a few other suggestions.   Use healthy plant oils, such as olive, canola, soy, corn, sunflower and peanut. Check the labels, and avoid partially hydrogenated oil, which have unhealthy trans fats.   If youre thirsty, drink water. Coffee and tea are good in moderation, but skip sugary drinks and limit milk and dairy products to one or two daily servings.   The type of carbohydrate in the diet is more important than the amount. Some sources of carbohydrates, such as vegetables, fruits, whole grains, and beans-are healthier than others.   Finally, stay active  Signed, Berniece Salines, DO  02/15/2022 9:58 AM    [place]

## 2022-02-11 NOTE — Patient Instructions (Signed)
Medication Instructions:  Your physician recommends that you continue on your current medications as directed. Please refer to the Current Medication list given to you today.  *If you need a refill on your cardiac medications before your next appointment, please call your pharmacy*   Lab Work: None ordered   If you have labs (blood work) drawn today and your tests are completely normal, you will receive your results only by: MyChart Message (if you have MyChart) OR A paper copy in the mail If you have any lab test that is abnormal or we need to change your treatment, we will call you to review the results.   Testing/Procedures: None ordered    Follow-Up: At [facility], you and your health needs are our priority.  As part of our continuing mission to provide you with exceptional heart care, we have created designated Provider Care Teams.  These Care Teams include your primary Cardiologist (physician) and Advanced Practice Providers (APPs -  Physician Assistants and Nurse Practitioners) who all work together to provide you with the care you need, when you need it.  We recommend signing up for the patient portal called "MyChart".  Sign up information is provided on this After Visit Summary.  MyChart is used to connect with patients for Virtual Visits (Telemedicine).  Patients are able to view lab/test results, encounter notes, upcoming appointments, etc.  Non-urgent messages can be sent to your provider as well.   To learn more about what you can do with MyChart, go to [email or web address].    Your next appointment:   6 month(s)  The format for your next appointment:   In Person  Provider:   Thomasene Ripple, DO     Other Instructions   Important Information About Sugar

## 2022-02-12 NOTE — Telephone Encounter (Signed)
Patient called to let [name] know that she cancelled her MRI because she does not feel well. Patient was advised to reschedule MRI.

## 2022-02-13 ENCOUNTER — Ambulatory Visit (HOSPITAL_BASED_OUTPATIENT_CLINIC_OR_DEPARTMENT_OTHER): Payer: Medicare PPO

## 2022-02-15 ENCOUNTER — Other Ambulatory Visit: Payer: Self-pay | Admitting: Family

## 2022-02-15 NOTE — Telephone Encounter (Signed)
Requesting: diazepam 10mg   Contract:03/14/2020 UDS:03/05/21 Last Visit: 02/05/22 Next Visit: None Last Refill: 01/18/22 #45 and 0RF   Please Advise

## 2022-02-15 NOTE — Telephone Encounter (Signed)
Patient scheduled to get MRI 02/20/22

## 2022-02-17 ENCOUNTER — Other Ambulatory Visit: Payer: Self-pay | Admitting: Family

## 2022-02-20 ENCOUNTER — Ambulatory Visit (HOSPITAL_BASED_OUTPATIENT_CLINIC_OR_DEPARTMENT_OTHER): Payer: Medicare PPO

## 2022-02-22 ENCOUNTER — Ambulatory Visit (INDEPENDENT_AMBULATORY_CARE_PROVIDER_SITE_OTHER): Payer: Medicare PPO | Admitting: Pharmacist

## 2022-02-22 ENCOUNTER — Encounter: Payer: Medicare PPO | Admitting: Pharmacist

## 2022-02-22 ENCOUNTER — Telehealth: Payer: Self-pay | Admitting: Pharmacist

## 2022-02-22 ENCOUNTER — Telehealth (HOSPITAL_BASED_OUTPATIENT_CLINIC_OR_DEPARTMENT_OTHER): Payer: Self-pay

## 2022-02-22 DIAGNOSIS — R413 Other amnesia: Secondary | ICD-10-CM

## 2022-02-22 DIAGNOSIS — N189 Chronic kidney disease, unspecified: Secondary | ICD-10-CM

## 2022-02-22 DIAGNOSIS — R7989 Other specified abnormal findings of blood chemistry: Secondary | ICD-10-CM

## 2022-02-22 NOTE — Progress Notes (Signed)
  Pharmacy Note  02/22/2022 Name: Veronica Wolf MRN: [id number] DOB: 03/01/1941  Subjective: Veronica Wolf is a 81 y.o. year old female who is a primary care patient of O'Sullivan, Melissa, NP. Clinical Pharmacist Practitioner referral was placed to assist with medication management.    Engaged with patient by telephone for follow up visit today.   Medication Management: Patient continues to receive medications from her pharmacy in adherence packaging to help her to take the right medications daily. She endorses that she has been taking as directed per packaging directions. Verified that [facility] has been placing 0.5 tablet of metoprolol in her packaging.  Memory changes / elevated prolactin: Patient was not able to go to her MRI of brain that was scheduled for 02/20/2022. She is working with imaging to get the rescheduled. Patient a lot of questions about what an MRI is and why it was ordered. Explained to patient that due to elevated prolactin, her PCP ordered the MRI to see what could be the cause.  She was also referred to neurology but neuro office has not been able to reach patient yet to schedule appointment.  Changes in breast:  She reports some bruising on her breast after cardio procedure. She also states she had noticed some clear discharge from her nipples from time to time. Last mammogram was 2017. She has a sister that had breast cancer and patient is concerned about nipple discharge. Patient would like to get mammogram.  Decreased eGFR: Noted that labs from 01/21/2022 showed eGFR of 32 CKD 3b. Estimated CrCL = 36 mL/min. Reviewed medication list for medication adjustments.   Recent Office Visits:  02/11/2022 - Cardio (Dr Tobbs) mild CAD - continue Imdur and continue statin. No med changes.  02/05/2022 - PCP (O'Sullivan, NP) patient was seen for ER follow up. Referred to neurology for memory changes. Referred to nephrology due to increase Scr. Ordered mammogram and labs. Noted  that prolactin was elevated. Recommended patient take metoprolol 25mg 0.5 tablet daily. Prescribed fluconazole for acute vaginitis.  01/27/2022 - Left Heart Cath Conclusion:. 1.  Mild obstructive coronary artery disease. 2.  LVEDP 15mmHg. 3.  Occluded right radial artery. 4.  CFR of 2.6 with normal being greater than 2.5 and IMR of 50 with normal being greater less than 25.  This suggests that the patient has some degree of microvascular dysfunction. Recommendation: Medical therapy and consider discontinuing nitrates.  01/21/2022 - Cardio (Dr Tobb) F/U chest pain. CP has not improved with increase in isosorbide to 60mg daily. Ordered cath - scheduled for 01/27/2022.   SDOH (Social Determinants of Health) assessments and interventions performed:  SDOH Interventions    Flowsheet Row Chronic Care Management from 10/07/2021 in [facility] Chronic Care Management from 05/13/2021 in [facility] Telephone from 05/08/2021 in [facility] Chronic Care Management from 03/25/2021 in  [facility] Telephone from 03/09/2021 in [facility] Care Coordination Patient Outreach Telephone from 03/06/2021 in [facility] Community Care Coordination  SDOH Interventions        Food Insecurity Interventions -- Intervention Not Indicated, Other (Comment)  [Working with Community Care Guide to Obtain Food/Nutrition Resources] [id number] Referral  [Meals on Wheels] -- -- --  Housing Interventions Intervention Not Indicated Intervention Not Indicated -- -- -- --  Transportation Interventions -- Intervention Not Indicated, Other (Comment)  [Working with Community Care Guide   to [employer] and Resources] -- -- Other (Comment)  [Patient outreached and going to mail her information on [employer] transportation and also on [place] transport[place]ion] --  [referral sent to community care guide for transportation assistance]  Depression Interventions/Treatment  -- Referral to Psychiatry, Medication, Counseling -- -- -- Medication  Financial Strain Interventions Intervention Not Indicated Intervention Not Indicated -- -- -- --  Physical Activity Interventions -- Patient Refused -- Patient Refused -- --  Stress Interventions -- [employer], Provide Counseling, Other (Comment)  Financial planner for Counseling Services] -- -- -- --  Social Connections Interventions -- Patient Refused -- -- -- --        Objective: Review of patient status, including review of consultants reports, laboratory and other test data, was performed as part of comprehensive evaluation and provision of chronic care management services.   Lab Results  Component Value Date   CREATININE 1.62 (H) 01/21/2022   CREATININE 1.45 (H) 11/06/2021   CREATININE 1.52 (H) 08/26/2021    Lab Results  Component Value Date   HGBA1C 6.7 (H) 02/05/2022       Component Value Date/Time   CHOL 127 08/26/2021 0905   TRIG 135.0 08/26/2021 0905   HDL 43.10 08/26/2021 0905   CHOLHDL 3 08/26/2021 0905   VLDL 27.0 08/26/2021 0905   LDLCALC 57 08/26/2021 0905   LDLCALC 108 (H) 03/14/2020 1439     Clinical ASCVD: Yes  The ASCVD Risk score (Arnett DK, et al., 2019) failed to calculate for the following reasons:   The 2019 ASCVD risk score is only valid for ages 59 to 33   The patient has a prior MI or stroke diagnosis    BP Readings from Last 3 Encounters:  02/11/22 126/70  02/05/22 125/85  01/27/22 109/63     Allergies  Allergen Reactions   Ibuprofen Other (See Comments)    Pt has hx of peptic ulcer and diverticulitis.     Medications Reviewed Today     Reviewed by Hinton Dyer, CMA (Certified Medical Assistant) on 02/11/22 at 1134  Med List Status: <None>   Medication Order Taking? Sig Documenting Provider Last Dose  Status Informant  albuterol (PROVENTIL) (2.5 MG/3ML) 0.083% nebulizer solution [id number] Yes USE 1 VIAL IN NEBULIZER EVERY 6 HOURS AS NEEDED FOR WHEEZING AND FOR SHORTNESS OF Thayer Headings, NP Taking Active   albuterol (VENTOLIN HFA) 108 (90 Base) MCG/ACT inhaler [id number] Yes Inhale 2 puffs into the lungs every 6 (six) hours as needed for wheezing or shortness of breath. Debbrah Alar, NP Taking Active   allopurinol (ZYLOPRIM) 100 MG tablet [id number] Yes TAKE TWO TABLETS BY MOUTH ONCE DAILY Debbrah Alar, NP Taking Active   aspirin 81 MG EC tablet [id number] Yes Take 1 tablet (81 mg total) by mouth daily. Swallow whole. Berniece Salines, DO Taking Active            Med Note Antony Contras, Wynne Jury B   Wed Oct 07, 2021 11:19 AM)    atorvastatin (LIPITOR) 80 MG tablet [id number] Yes TAKE ONE TABLET BY MOUTH EVERYDAY AT BEDTIME Tobb, Kardie, DO Taking Active   chlorhexidine (PERIDEX) 0.12 % solution [id number] Yes SMARTSIG:0.5 Ounce(s) By Mouth Twice Daily [provider] Taking Active   colchicine 0.6 MG tablet [id number] Yes Take 0.6 mg by mouth daily as needed (gout). [provider] Taking Active   diazepam (VALIUM) 10 MG tablet [id number] Yes TAKE ONE TABLET BY MOUTH EVERY TWELVE HOURS AS NEEDED FOR ANXIETY Inda Castle,  Melissa, NP Taking Active   dicyclomine (BENTYL) 10 MG capsule [id number] Yes Take 1 capsule (10 mg total) by mouth in the morning, at noon, in the evening, and at bedtime. Debbrah Alar, NP Taking Active   escitalopram (LEXAPRO) 20 MG tablet [id number] Yes TAKE ONE TABLET BY MOUTH EVERY MORNING Debbrah Alar, NP Taking Active   ezetimibe (ZETIA) 10 MG tablet [id number] Yes TAKE ONE TABLET BY MOUTH ONCE DAILY Tobb, Kardie, DO Taking Active   famotidine (PEPCID) 20 MG tablet [id number] Yes Take 1 tablet (20 mg total) by mouth at bedtime. Colon Branch, MD Taking Active   fluconazole (DIFLUCAN) 150 MG tablet [id number] Yes Take 1 tablet by mouth now and  then again in 3 days Debbrah Alar, NP Taking Active   isosorbide mononitrate (IMDUR) 60 MG 24 hr tablet [id number] Yes Take 1 tablet (60 mg total) by mouth daily. Berniece Salines, DO Taking Active   levothyroxine (SYNTHROID) 25 MCG tablet [id number] Yes TAKE ONE TABLET BY MOUTH EVERY MORNING Debbrah Alar, NP Taking Active   lisinopril (ZESTRIL) 2.5 MG tablet [id number] Yes TAKE ONE TABLET BY MOUTH EVERYDAY AT BEDTIME Tobb, Kardie, DO Taking Active   meclizine (ANTIVERT) 25 MG tablet [id number] Yes TAKE ONE TABLET BY MOUTH twice daily AS NEEDED FOR Terence Lux, NP Taking Active   metoprolol succinate (TOPROL-XL) 25 MG 24 hr tablet [id number] Yes Take 0.5 tablets by mouth daily. [provider] Taking Active   nitroGLYCERIN (NITROSTAT) 0.4 MG SL tablet [id number] Yes Place 1 tablet (0.4 mg total) under the tongue every 5 (five) minutes as needed for chest pain. Berniece Salines, DO Taking Active   nystatin (MYCOSTATIN/NYSTOP) powder [id number] Yes APPLY powder TOPICALLY TO THE AFFECTED AREA(S) TWICE DAILY Debbrah Alar, NP Taking Active   pantoprazole (PROTONIX) 40 MG tablet [id number] Yes TAKE ONE TABLET BY MOUTH TWICE DAILY Debbrah Alar, NP Taking Active   potassium chloride SA (KLOR-CON M) 20 MEQ tablet [id number] Yes TAKE ONE TABLET BY MOUTH AT Guerry Bruin, Lenna Sciara, NP Taking Active   Respiratory Therapy Supplies (NEBULIZER/TUBING/MOUTHPIECE) KIT [id number] Yes Use as directed Debbrah Alar, NP Taking Active   sucralfate (CARAFATE) 1 g tablet [id number] Yes TAKE ONE TABLET BY MOUTH FOUR TIMES DAILY. MAY cut pill in half AND DISSOLVE in water prior TO drinking Debbrah Alar, NP Taking Active             Patient Active Problem List   Diagnosis Date Noted   Elevated prolactin level 02/10/2022   Galactorrhea 02/05/2022   Controlled type 2 diabetes mellitus with complication, without long-term current use of insulin ([place]) 02/05/2022   Pulmonary  hypertension, unspecified ([place]) 09/17/2021   Morbid obesity ([place]) 09/17/2021   Acute vaginitis 09/14/2021   Atypical chest pain 08/26/2021   Memory loss 08/26/2021   Cough 04/21/2021   Back pain 12/03/2020   Grief reaction 12/03/2020   Fatty liver 12/01/2020   Intermittent left-sided chest pain 09/17/2020   Coronary artery disease involving native coronary artery of native heart 20/94/7096   Metabolic syndrome 28/36/6294   Allergy 09/16/2020   Anxiety 09/16/2020   Hypertension 09/16/2020   Vertigo 09/16/2020   Dark stools 09/08/2020   Dysphagia    Esophageal stricture    Hiatal hernia    Epigastric pain    Hypothyroid 01/25/2018   Mild CAD 10/03/2017   Stress-induced cardiomyopathy 09/19/2017   COPD (chronic obstructive pulmonary disease) ([place]) 09/14/2017   Hypokalemia 09/14/2017   Non-ST elevation (NSTEMI) myocardial infarction [facility]) - due to Demand Infarction  09/14/2017   CKD (chronic kidney disease) 04/04/2017   HLD (hyperlipidemia)    Allergic rhinitis    Gout 02/28/2017   Depression with anxiety 02/28/2017   COPD with chronic bronchitis 12/22/2016   GERD (gastroesophageal reflux disease) 12/22/2016   DOE (dyspnea on exertion) 12/22/2016   Peptic ulcer 01/04/2003     Medication Assistance:  None required.  Patient affirms current coverage meets needs.   Assessment / Plan: Medication Management: Continue to use adherence packaging.  Checked with pharmacy to verify that she is getting metoprolol 0.5 tablet daily in her adherence packaging.  Memory changes / elevated prolactin: Encouraged her to reschedule her MRI of brain ASAP. Also provided number for neurology [phone]. Recommended she see them after MRI.  Reviewed potential medication causes of hyperprolactinemia - only med on her list I could find that could cause hyperprolactinemia is famotidine.   Changes in breast:  Will consult with PCP about mammogram - does she need screening mammogram or  diagnostic.     Follow Up:  Telephone follow up appointment with care management team member scheduled for:  03/2022.   Tammy Eckard, PharmD Clinical Pharmacist [facility] [phone] 

## 2022-02-22 NOTE — Telephone Encounter (Signed)
  Care Management   Follow Up Note   02/22/2022 Name: Veronica Wolf MRN: [id number] DOB: 29-Feb-1941   Referred by: [name], NP  An unsuccessful telephone outreach was attempted today. The patient was referred to the case management team for assistance with care management and care coordination.   Follow Up Plan: The care management team will reach out to the patient again over the next 30 days.    [name], PharmD Clinical Pharmacist [facility]

## 2022-02-23 NOTE — Telephone Encounter (Signed)
Please advise pt that because she is having the breast discharge she needs a diagnostic mammogram and these can only be done at the [facility].  Also, has she rescheduled her MRI? Does she need help with transportation?

## 2022-02-23 NOTE — Telephone Encounter (Signed)
Lvm for patient to call back

## 2022-02-24 NOTE — Telephone Encounter (Signed)
Patient notified of information and provided with the number to the breast center of [place]. She verbalized understanding and will call for appointment with them and to reschedule the MRI

## 2022-03-01 ENCOUNTER — Other Ambulatory Visit: Payer: Self-pay | Admitting: Family

## 2022-03-01 DIAGNOSIS — R42 Dizziness and giddiness: Secondary | ICD-10-CM

## 2022-03-05 ENCOUNTER — Other Ambulatory Visit: Payer: Self-pay | Admitting: Family

## 2022-03-05 DIAGNOSIS — N643 Galactorrhea not associated with childbirth: Secondary | ICD-10-CM

## 2022-03-12 ENCOUNTER — Ambulatory Visit: Payer: Medicare PPO | Admitting: Pharmacist

## 2022-03-12 ENCOUNTER — Telehealth: Payer: Self-pay | Admitting: Pharmacist

## 2022-03-12 DIAGNOSIS — R413 Other amnesia: Secondary | ICD-10-CM

## 2022-03-12 DIAGNOSIS — I25118 Atherosclerotic heart disease of native coronary artery with other forms of angina pectoris: Secondary | ICD-10-CM

## 2022-03-12 DIAGNOSIS — R7989 Other specified abnormal findings of blood chemistry: Secondary | ICD-10-CM

## 2022-03-12 NOTE — Progress Notes (Signed)
Pharmacy Note  03/12/2022 Name: Veronica Wolf MRN: [id number] DOB: [birth year]  Subjective: Veronica Wolf is a 81 y.o. year old female who is a primary care patient of Veronica Wolf. Clinical Pharmacist Practitioner referral was placed to assist with medication management.    Engaged with patient by telephone for follow up visit today.  Today patient reports she has had a cough for about 5 days. She is taking cough medication but was unable to provide me the name. There were openings for either virtual or in office visit for this afternoon but patient states she thinks maybe she will feel better once she eats. Gave patient number for Triage Line [phone] incase she is not feeling better.  Medication Management: Patient continues to receive medications from her pharmacy in adherence packaging to help her to take the right medications daily. She endorses that she has been taking as directed per packaging directions. Delivery next due around 03/26/2022  Memory changes / elevated prolactin: Patient still had not gotten recommended MRI of brain. Was scheduled for 02/20/2022 but she was not able to go.   She was also referred to neurology but neuro office has not been able to reach patient yet to schedule appointment.  Patient was given their phone number at last appt with me but she has not called them yet to make appointment.  Changes in breast:  At previous appt she has reported some bruising on her breast after cardio procedure. She also states she had noticed some clear discharge from her nipples from time to time. Last mammogram was 2017. She has a sister that had breast cancer and patient is concerned about nipple discharge. Patient requested mammogram and her PCP ordered 03/05/2022 but she has not completed yet.    Recent Office Visits:  02/11/2022 - Cardio (Veronica Wolf) mild CAD - continue Imdur and continue statin. No med changes.  02/05/2022 - PCP Veronica Wolf) patient was  seen for ER follow up. Referred to neurology for memory changes. Referred to nephrology due to increase Scr. Ordered mammogram and labs. Noted that prolactin was elevated. Recommended patient take metoprolol 25mg  0.5 tablet daily. Prescribed fluconazole for acute vaginitis.  01/27/2022 - Left Heart Cath Conclusion:. 1.  Mild obstructive coronary artery disease. 2.  LVEDP . 3.  Occluded right radial artery. 4.  CFR of 2.6 with normal being greater than 2.5 and IMR of 50 with normal being greater less than 25.  This suggests that the patient has some degree of microvascular dysfunction. Recommendation: Medical therapy and consider discontinuing nitrates.  01/21/2022 - Cardio (Veronica Wolf) F/U chest pain. CP has not improved with increase in isosorbide to 60mg  daily. Ordered cath - scheduled for 01/27/2022.   SDOH (Social Determinants of Health) assessments and interventions performed:  SDOH Interventions    Flowsheet Row Chronic Care Management from 10/07/2021 in [facility] at [facility] Chronic Care Management from 05/13/2021 in [facility] at [facility] Telephone from 05/08/2021 in [facility] at [facility] Chronic Care Management from 03/25/2021 in [facility] at [facility] Telephone from 03/09/2021 in [facility] Coordination Patient Outreach Telephone from 03/06/2021 in [facility]  SDOH Interventions        Food Insecurity Interventions -- Intervention Not Indicated, Other (Comment)  [Working with [employer] to Obtain Food/Nutrition Resources] [facility] Referral  [Meals on Wheels] -- -- --  Housing Interventions Intervention Not Indicated Intervention Not Indicated -- -- -- --  Transportation Interventions -- Intervention Not Indicated, Other (Comment)  [Working with [employer] Guide to [employer] and Resources] -- -- Other (Comment)  [Patient outreached and going to mail her information on [place] transportation and also on [place] transport[place]ion] --  [referral sent to community care guide for transportation assistance]  Depression Interventions/Treatment  -- Referral to Psychiatry, Medication, Counseling -- -- -- Medication  Financial Strain Interventions Intervention Not Indicated Intervention Not Indicated -- -- -- --  Physical Activity Interventions -- Patient Refused -- Patient Refused -- --  Stress Interventions -- [employer], Provide Counseling, Other (Comment)  Research officer, trade union for Counseling Services] -- -- -- --  Social Connections Interventions -- Patient Refused -- -- -- --        Objective: Review of patient status, including review of consultants reports, laboratory and other test data, was performed as part of comprehensive evaluation and provision of chronic care management services.   Lab Results  Component Value Date   CREATININE 1.62 (H) 01/21/2022   CREATININE 1.45 (H) 11/06/2021   CREATININE 1.52 (H) 08/26/2021    Lab Results  Component Value Date   HGBA1C 6.7 (H) 02/05/2022       Component Value Date/Time   CHOL 127 08/26/2021 0905   TRIG 135.0 08/26/2021 0905   HDL 43.10 08/26/2021 0905   CHOLHDL 3 08/26/2021 0905   VLDL 27.0 08/26/2021 0905   LDLCALC 57 08/26/2021 0905   LDLCALC 108 (H) 03/14/2020 1439     Clinical ASCVD: Yes  The ASCVD Risk score (Arnett DK, et al., 2019) failed to calculate for the following reasons:   The 2019 ASCVD risk score is only valid for ages 16 to 48   The patient has a prior MI or stroke diagnosis    BP Readings from Last 3 Encounters:  02/11/22 126/70  02/05/22 125/85  01/27/22 109/63     Allergies  Allergen Reactions   Ibuprofen Other (See Comments)    Pt has hx of peptic ulcer and diverticulitis.     Medications Reviewed Today     Reviewed by Veronica Wolf, RPH-CPP  (Pharmacist) on 03/12/22 at 1109  Med List Status: <None>   Medication Order Taking? Sig Documenting Provider Last Dose Status Informant  albuterol (PROVENTIL) (2.5 MG/3ML) 0.083% nebulizer solution [id number] No USE 1 VIAL IN NEBULIZER EVERY 6 HOURS AS NEEDED FOR WHEEZING AND FOR SHORTNESS OF BREATH  Patient not taking: Reported on 03/12/2022   Veronica Wolf Not Taking Active   albuterol (VENTOLIN HFA) 108 (90 Base) MCG/ACT inhaler [id number] Yes Inhale 2 puffs into the lungs every 6 (six) hours as needed for wheezing or shortness of breath. Veronica Wolf Taking Active   allopurinol (ZYLOPRIM) 100 MG tablet [id number] Yes TAKE TWO TABLETS BY MOUTH ONCE DAILY Veronica Wolf Taking Active   aspirin 81 MG EC tablet [id number] Yes Take 1 tablet (81 mg total) by mouth daily. Swallow whole. Thomasene Ripple, DO Taking Active            Med Note Clydie Braun, Etty Isaac B   Wed Oct 07, 2021 11:19 AM)    atorvastatin (LIPITOR) 80 MG tablet [id number] Yes TAKE ONE TABLET BY MOUTH EVERYDAY AT BEDTIME Tobb, Kardie, DO Taking Active   colchicine 0.6 MG tablet [id number] No Take 0.6 mg by mouth daily as needed (gout).  Patient not taking: Reported on 03/12/2022   [provider] Not  Taking Active   diazepam (VALIUM) 10 MG tablet [id number] Yes TAKE ONE TABLET BY MOUTH EVERY TWELVE HOURS AS NEEDED FOR ANXIETY Veronica Wolf Taking Active   dicyclomine (BENTYL) 10 MG capsule [id number] No Take 1 capsule (10 mg total) by mouth in the morning, at noon, in the evening, and at bedtime.  Patient not taking: Reported on 03/12/2022   Veronica Wolf Not Taking Active   escitalopram (LEXAPRO) 20 MG tablet [id number] Yes TAKE ONE TABLET BY MOUTH EVERY MORNING Veronica Wolf Taking Active   ezetimibe (ZETIA) 10 MG tablet [id number] Yes TAKE ONE TABLET BY MOUTH ONCE DAILY Tobb, Kardie, DO Taking Active   famotidine (PEPCID) 20 MG tablet [id number] Yes Take 1 tablet (20 mg total) by  mouth at bedtime. Wanda Plump, MD Taking Active   isosorbide mononitrate (IMDUR) 60 MG 24 hr tablet [id number] Yes Take 1 tablet (60 mg total) by mouth daily. Thomasene Ripple, DO Taking Active   levothyroxine (SYNTHROID) 25 MCG tablet [id number] Yes TAKE ONE TABLET BY MOUTH EVERY MORNING Veronica Wolf Taking Active   lisinopril (ZESTRIL) 2.5 MG tablet [id number] Yes TAKE ONE TABLET BY MOUTH EVERYDAY AT BEDTIME Tobb, Kardie, DO Taking Active   meclizine (ANTIVERT) 25 MG tablet [id number] Yes TAKE ONE TABLET BY MOUTH twice daily AS NEEDED FOR dizziness Veronica Wolf Taking Active   metoprolol succinate (TOPROL-XL) 25 MG 24 hr tablet [id number] Yes Take 0.5 tablets by mouth daily. [provider] Taking Active   nitroGLYCERIN (NITROSTAT) 0.4 MG SL tablet [id number] Yes Place 1 tablet (0.4 mg total) under the tongue every 5 (five) minutes as needed for chest pain. Thomasene Ripple, DO Taking Active   nystatin (MYCOSTATIN/NYSTOP) powder [id number] Yes APPLY powder TOPICALLY TO THE AFFECTED AREA(S) TWICE DAILY Veronica Wolf Taking Active   pantoprazole (PROTONIX) 40 MG tablet [id number] Yes TAKE ONE TABLET BY MOUTH TWICE DAILY Veronica Wolf Taking Active   potassium chloride SA (KLOR-CON M) 20 MEQ tablet [id number] Yes TAKE ONE TABLET BY MOUTH AT Delma Officer, Efraim Kaufmann, Wolf Taking Active   Respiratory Therapy Supplies (NEBULIZER/TUBING/MOUTHPIECE) KIT [id number] Yes Use as directed Veronica Wolf Taking Active   sucralfate (CARAFATE) 1 g tablet [id number] No TAKE ONE TABLET BY MOUTH FOUR TIMES DAILY. MAY cut pill in half AND DISSOLVE in water prior TO drinking  Patient not taking: Reported on 03/12/2022   Veronica Wolf Not Taking Active             Patient Active Problem List   Diagnosis Date Noted   Elevated prolactin level 02/10/2022   Galactorrhea 02/05/2022   Controlled type 2 diabetes mellitus with complication, without long-term current use  of insulin (HCC) 02/05/2022   Pulmonary hypertension, unspecified (HCC) 09/17/2021   Morbid obesity (HCC) 09/17/2021   Acute vaginitis 09/14/2021   Atypical chest pain 08/26/2021   Memory loss 08/26/2021   Cough 04/21/2021   Back pain 12/03/2020   Grief reaction 12/03/2020   Fatty liver 12/01/2020   Intermittent left-sided chest pain 09/17/2020   Coronary artery disease involving native coronary artery of native heart 09/17/2020   Metabolic syndrome 09/17/2020   Allergy 09/16/2020   Anxiety 09/16/2020   Hypertension 09/16/2020   Vertigo 09/16/2020   Dark stools 09/08/2020   Dysphagia    Esophageal stricture    Hiatal hernia    Epigastric pain    Hypothyroid 01/25/2018   Mild CAD 10/03/2017   Stress-induced cardiomyopathy 09/19/2017   COPD (chronic obstructive pulmonary disease) (HCC)  09/14/2017   Hypokalemia 09/14/2017   Non-ST elevation (NSTEMI) myocardial infarction [facility]) - due to Demand Infarction 09/14/2017   CKD (chronic kidney disease) 04/04/2017   HLD (hyperlipidemia)    Allergic rhinitis    Gout 02/28/2017   Depression with anxiety 02/28/2017   COPD with chronic bronchitis 12/22/2016   GERD (gastroesophageal reflux disease) 12/22/2016   DOE (dyspnea on exertion) 12/22/2016   Peptic ulcer 01/04/2003     Medication Assistance:  None required.  Patient affirms current coverage meets needs.   Assessment / Plan: Medication Management: Continue to use adherence packaging.   Memory changes / elevated prolactin: Encouraged her to reschedule her MRI of brain ASAP. Again provided number for neurology [phone]. Recommended she see them after MRI. Since patient is not feeling well today I will call her back next week and try to assist in making these appointment with her.    Cough: Offered virtual or in office visit for this afternoon but patient declined. Also provided number for Triage Line [phone] incase she is not feeling better  Changes in breast:  Need  mammogram - order was placed 03/05/2022 by PCP - needs to contact imaging in to make appt; Since patient is not feeling well today I will call her back next week and try to assist in making appointment with her.    Follow Up:  Telephone follow up appointment with care management team member scheduled for:  03/2022.   Veronica Wolf, PharmD Clinical Pharmacist [facility]  - [facility] [phone]

## 2022-03-12 NOTE — Telephone Encounter (Signed)
Error

## 2022-03-15 ENCOUNTER — Telehealth: Payer: Self-pay | Admitting: Pharmacist

## 2022-03-15 NOTE — Telephone Encounter (Signed)
Patient called back. She sounds worse that she did Friday. She did not contact Triage nurse over the weekend.  She sounds very congested over the phone and is coughing.  Recommended she get someone to bring her to [facility]. Patient is agreeable.

## 2022-03-15 NOTE — Telephone Encounter (Signed)
Patient had c/o cough last week during our phone appointment. Had offered in office or virtual visit but patient declined. Tried to call today to check on her to see if she was feeling better. Unable to reach patient. LM on VM .

## 2022-03-16 ENCOUNTER — Other Ambulatory Visit: Payer: Self-pay | Admitting: Family

## 2022-03-16 NOTE — Progress Notes (Signed)
This encounter was created in error - please disregard.  This encounter was created in error - please disregard.

## 2022-03-19 ENCOUNTER — Other Ambulatory Visit: Payer: Self-pay

## 2022-03-19 ENCOUNTER — Emergency Department (HOSPITAL_BASED_OUTPATIENT_CLINIC_OR_DEPARTMENT_OTHER)
Admission: EM | Admit: 2022-03-19 | Discharge: 2022-03-19 | Disposition: A | Discharge status: Home / Self Care | Payer: Medicare PPO | Attending: Emergency Medicine | Admitting: Emergency Medicine

## 2022-03-19 ENCOUNTER — Emergency Department (HOSPITAL_BASED_OUTPATIENT_CLINIC_OR_DEPARTMENT_OTHER): Payer: Medicare PPO

## 2022-03-19 DIAGNOSIS — I10 Essential (primary) hypertension: Secondary | ICD-10-CM | POA: Insufficient documentation

## 2022-03-19 DIAGNOSIS — B974 Respiratory syncytial virus as the cause of diseases classified elsewhere: Secondary | ICD-10-CM | POA: Diagnosis not present

## 2022-03-19 DIAGNOSIS — Z20822 Contact with and (suspected) exposure to covid-19: Secondary | ICD-10-CM | POA: Insufficient documentation

## 2022-03-19 DIAGNOSIS — R197 Diarrhea, unspecified: Secondary | ICD-10-CM | POA: Diagnosis not present

## 2022-03-19 DIAGNOSIS — Z79899 Other long term (current) drug therapy: Secondary | ICD-10-CM | POA: Diagnosis not present

## 2022-03-19 DIAGNOSIS — Z7982 Long term (current) use of aspirin: Secondary | ICD-10-CM | POA: Insufficient documentation

## 2022-03-19 DIAGNOSIS — B338 Other specified viral diseases: Secondary | ICD-10-CM

## 2022-03-19 DIAGNOSIS — J449 Chronic obstructive pulmonary disease, unspecified: Secondary | ICD-10-CM | POA: Diagnosis not present

## 2022-03-19 DIAGNOSIS — K625 Hemorrhage of anus and rectum: Secondary | ICD-10-CM | POA: Diagnosis not present

## 2022-03-19 DIAGNOSIS — J9811 Atelectasis: Secondary | ICD-10-CM | POA: Diagnosis not present

## 2022-03-19 DIAGNOSIS — R059 Cough, unspecified: Secondary | ICD-10-CM | POA: Diagnosis not present

## 2022-03-19 DIAGNOSIS — R0602 Shortness of breath: Secondary | ICD-10-CM | POA: Diagnosis not present

## 2022-03-19 LAB — OCCULT BLOOD X 1 CARD TO LAB, STOOL: Fecal Occult Bld: NEGATIVE

## 2022-03-19 LAB — COMPREHENSIVE METABOLIC PANEL
ALT: 11 U/L (ref 0–44)
AST: 23 U/L (ref 15–41)
Albumin: 3.7 g/dL (ref 3.5–5.0)
Alkaline Phosphatase: 73 U/L (ref 38–126)
Anion gap: 9 (ref 5–15)
BUN: 13 mg/dL (ref 8–23)
CO2: 23 mmol/L (ref 22–32)
Calcium: 9 mg/dL (ref 8.9–10.3)
Chloride: 107 mmol/L (ref 98–111)
Creatinine, Ser: 1.6 mg/dL — ABNORMAL HIGH (ref 0.44–1.00)
GFR, Estimated: 32 mL/min — ABNORMAL LOW (ref >60)
Glucose, Bld: 108 mg/dL — ABNORMAL HIGH (ref 70–99)
Potassium: 4.2 mmol/L (ref 3.5–5.1)
Sodium: 139 mmol/L (ref 135–145)
Total Bilirubin: 0.7 mg/dL (ref 0.3–1.2)
Total Protein: 7.8 g/dL (ref 6.5–8.1)

## 2022-03-19 LAB — RESP PANEL BY RT-PCR (RSV, FLU A&B, COVID)  RVPGX2
Influenza A by PCR: NEGATIVE
Influenza B by PCR: NEGATIVE
Resp Syncytial Virus by PCR: POSITIVE — AB
SARS Coronavirus 2 by RT PCR: NEGATIVE

## 2022-03-19 LAB — CBC
HCT: 39.3 % (ref 36.0–46.0)
Hemoglobin: 12.1 g/dL (ref 12.0–15.0)
MCH: 27.3 pg (ref 26.0–34.0)
MCHC: 30.8 g/dL (ref 30.0–36.0)
MCV: 88.7 fL (ref 80.0–100.0)
Platelets: 236 10*3/uL (ref 150–400)
RBC: 4.43 MIL/uL (ref 3.87–5.11)
RDW: 15.8 % — ABNORMAL HIGH (ref 11.5–15.5)
WBC: 12 10*3/uL — ABNORMAL HIGH (ref 4.0–10.5)
nRBC: 0 % (ref 0.0–0.2)

## 2022-03-19 MED ORDER — BENZONATATE 100 MG PO CAPS: 100.0000 mg | ORAL_CAPSULE | Freq: Three times a day (TID) | ORAL | 0 refills | Status: DC

## 2022-03-19 MED ORDER — PREDNISONE 50 MG PO TABS
60.0000 mg | ORAL_TABLET | Freq: Once | ORAL | Status: AC
  Administered 2022-03-19: 60 mg via ORAL
  Filled 2022-03-19: qty 1

## 2022-03-19 MED ORDER — IPRATROPIUM-ALBUTEROL 0.5-2.5 (3) MG/3ML IN SOLN
3.0000 mL | Freq: Once | RESPIRATORY_TRACT | Status: AC
  Administered 2022-03-19: 3 mL via RESPIRATORY_TRACT
  Filled 2022-03-19: qty 3

## 2022-03-19 MED ORDER — PREDNISONE 10 MG (21) PO TBPK: ORAL_TABLET | Freq: Every day | ORAL | 0 refills | Status: DC

## 2022-03-19 NOTE — ED Provider Notes (Signed)
[facility] EMERGENCY DEPARTMENT Provider Note   CSN: [id number] Arrival date & time: 03/19/22  1559     History  Chief Complaint  Patient presents with   Rectal Bleeding    Veronica Wolf is a 81 y.o. female.   Rectal Bleeding    Patient with medical history of COPD, peptic ulcers, GERD, hypertension, hyperlipidemia, prior MI presents today due to cough x 3 days and 1 episode of dark tarry stool.  Patient states she has been having nonproductive cough for 3 days, worse at night.  She is not having any abdominal pain but did endorse some diarrhea, took Pepto yesterday and had 1 episode of black and tarry stool in the toilet.  No history of similar episodes.  She denies any abdominal pain, has not had a bowel movement since the episode.  She is not on blood thinners, states she recently had an EGD and colonoscopy she thinks with [place].  No chest pain, does not feel short of breath.  Home Medications Prior to Admission medications   Medication Sig Start Date End Date Taking? Authorizing Provider  albuterol (PROVENTIL) (2.5 MG/3ML) 0.083% nebulizer solution USE 1 VIAL IN NEBULIZER EVERY 6 HOURS AS NEEDED FOR WHEEZING AND FOR SHORTNESS OF BREATH Patient not taking: Reported on 03/12/2022 03/20/21   Debbrah Alar, NP  albuterol (VENTOLIN HFA) 108 (90 Base) MCG/ACT inhaler Inhale 2 puffs into the lungs every 6 (six) hours as needed for wheezing or shortness of breath. 08/26/21   Debbrah Alar, NP  allopurinol (ZYLOPRIM) 100 MG tablet TAKE TWO TABLETS BY MOUTH ONCE DAILY 02/17/22   Debbrah Alar, NP  aspirin 81 MG EC tablet Take 1 tablet (81 mg total) by mouth daily. Swallow whole. 10/28/20   Tobb, Kardie, DO  atorvastatin (LIPITOR) 80 MG tablet TAKE ONE TABLET BY MOUTH EVERYDAY AT BEDTIME 11/03/21   Tobb, Kardie, DO  colchicine 0.6 MG tablet Take 0.6 mg by mouth daily as needed (gout). Patient not taking: Reported on 03/12/2022    [provider]  diazepam  (VALIUM) 10 MG tablet TAKE ONE TABLET BY MOUTH EVERY TWELVE HOURS AS NEEDED FOR ANXIETY 02/16/22   Debbrah Alar, NP  dicyclomine (BENTYL) 10 MG capsule Take 1 capsule (10 mg total) by mouth in the morning, at noon, in the evening, and at bedtime. Patient not taking: Reported on 03/12/2022 03/20/21   Debbrah Alar, NP  escitalopram (LEXAPRO) 20 MG tablet TAKE ONE TABLET BY MOUTH EVERY MORNING 03/16/22   Debbrah Alar, NP  ezetimibe (ZETIA) 10 MG tablet TAKE ONE TABLET BY MOUTH ONCE DAILY 11/03/21   Tobb, Kardie, DO  famotidine (PEPCID) 20 MG tablet Take 1 tablet (20 mg total) by mouth at bedtime. 01/25/22   Colon Branch, MD  isosorbide mononitrate (IMDUR) 60 MG 24 hr tablet Take 1 tablet (60 mg total) by mouth daily. 09/17/21   Tobb, Kardie, DO  levothyroxine (SYNTHROID) 25 MCG tablet TAKE ONE TABLET BY MOUTH EVERY MORNING 02/17/22   Debbrah Alar, NP  lisinopril (ZESTRIL) 2.5 MG tablet TAKE ONE TABLET BY MOUTH EVERYDAY AT BEDTIME 11/03/21   Tobb, Kardie, DO  meclizine (ANTIVERT) 25 MG tablet TAKE ONE TABLET BY MOUTH twice daily AS NEEDED FOR dizziness 03/01/22   Debbrah Alar, NP  metoprolol succinate (TOPROL-XL) 25 MG 24 hr tablet Take 0.5 tablets by mouth daily. 11/30/21   [provider]  nitroGLYCERIN (NITROSTAT) 0.4 MG SL tablet Place 1 tablet (0.4 mg total) under the tongue every 5 (five) minutes as  needed for chest pain. 10/28/20   Tobb, Kardie, DO  nystatin (MYCOSTATIN/NYSTOP) powder APPLY powder TOPICALLY TO THE AFFECTED AREA(S) TWICE DAILY 01/28/22   Debbrah Alar, NP  pantoprazole (PROTONIX) 40 MG tablet TAKE ONE TABLET BY MOUTH TWICE DAILY 01/26/22   Debbrah Alar, NP  potassium chloride SA (KLOR-CON M) 20 MEQ tablet TAKE ONE TABLET BY MOUTH AT Gwendolyn Fill 11/24/21   Debbrah Alar, NP  Respiratory Therapy Supplies (NEBULIZER/TUBING/MOUTHPIECE) KIT Use as directed 08/26/21   Debbrah Alar, NP  sucralfate (CARAFATE) 1 g tablet TAKE ONE TABLET BY  MOUTH FOUR TIMES DAILY. MAY cut pill in half AND DISSOLVE in water prior TO drinking Patient not taking: Reported on 03/12/2022 03/20/21   Debbrah Alar, NP      Allergies    Ibuprofen    Review of Systems   Review of Systems  Gastrointestinal:  Positive for hematochezia.    Physical Exam Updated Vital Signs BP (!) 150/84   Pulse 89   Temp 98.7 F (37.1 C) (Oral)   Resp (!) 21   Ht _0  (1.626 m)   Wt 86.2 kg   SpO2 94%   BMI 32.61 kg/m  Physical Exam Vitals and nursing note reviewed. Exam conducted with a chaperone present.  Constitutional:      Appearance: Normal appearance.  HENT:     Head: Normocephalic and atraumatic.  Eyes:     General: No scleral icterus.       Right eye: No discharge.        Left eye: No discharge.     Extraocular Movements: Extraocular movements intact.     Pupils: Pupils are equal, round, and reactive to light.  Cardiovascular:     Rate and Rhythm: Normal rate and regular rhythm.     Pulses: Normal pulses.     Heart sounds: Normal heart sounds.     No friction rub. No gallop.  Pulmonary:     Effort: Pulmonary effort is normal. No respiratory distress.     Breath sounds: Wheezing present.  Abdominal:     General: Abdomen is flat. Bowel sounds are normal. There is no distension.     Palpations: Abdomen is soft.     Tenderness: There is no abdominal tenderness.     Comments: Abdomen is soft and nontender  Genitourinary:    Rectum: Guaiac result negative.     Comments: Rectal exam performed with RN Misty in room as chaperone.  No rectal tenderness, I do not appreciate any actively bleeding hemorrhoids.   Skin:    General: Skin is warm and dry.     Coloration: Skin is not jaundiced.  Neurological:     Mental Status: She is alert. Mental status is at baseline.     Coordination: Coordination normal.     ED Results / Procedures / Treatments   Labs (all labs ordered are listed, but only abnormal results are displayed) Labs  Reviewed  CBC - Abnormal; Notable for the following components:      Result Value   WBC 12.0 (*)    RDW 15.8 (*)    All other components within normal limits  RESP PANEL BY RT-PCR (RSV, FLU A&B, COVID)  [id number]  COMPREHENSIVE METABOLIC PANEL  OCCULT BLOOD X 1 CARD TO LAB, STOOL    EKG None  Radiology No results found.  Procedures Procedures    Medications Ordered in ED Medications - No data to display  ED Course/ Medical Decision Making/ A&P  Medical Decision Making Amount and/or Complexity of Data Reviewed Labs: ordered. Radiology: ordered.  Risk Prescription drug management.   Patient presents due to cough and rectal bleeding.  Differential includes but not limited to side effect from medication she is on, rectal bleeding, upper or lower GI bleed, diverticulitis, AVM's, COPD exacerbation, pneumonia, COVID, flu, RSV.  On exam patient has diffuse wheezing but is not tachypneic and not in respiratory distress.  Oxygen is stable.  Not tachycardic.  Abdominal exam is benign, no active hemorrhaging and fecal occult test is negative.  I ordered, viewed and interpreted laboratory workup. CBC with slight leukocytosis of 12.  Hemoglobin is stable at 12.1. CMP without gross electrolyte derangement or transaminitis. Elevated creatinine of 1.6 this is baseline per chart review. Fecal occult is negative. Viral panel is positive with RSV.    Patient is on cardiac monitoring in sinus rhythm.  Pulse oximetry stable at 94 to 95%.  Given fecal occult is negative and she took Pepto yesterday I think that is the reason she had dark stool.  I do not suspect an acute bleed, considered imaging but I do not think indicated for CT of the abdomen especially in the absence of abdominal pain.  Chest x-ray does not show a pneumonia.  I agree with radiologist.  I ordered DuoNeb and prednisone for the wheezing, on reevaluation lung sounds improved and she states she feels  less short of breath.  Will send patient home with prednisone Dosepak and have her follow-up closely with PCP.  I do not see indication for antibiotics.  Suspect RSV bronchitis, return precaution discussed with the patient and her family member who verbalized understanding and agreement with the plan.  Discussed HPI, physical exam and plan of care for this patient with attending [facility]. The attending physician evaluated this patient as part of a shared visit and agrees with plan of care.          Final Clinical Impression(s) / ED Diagnoses Final diagnoses:  None    Rx / DC Orders ED Discharge Orders     None         Sherrill Raring, Hershal Coria 03/19/22 2318    Blanchie Dessert, MD 03/19/22 2334

## 2022-03-19 NOTE — ED Triage Notes (Signed)
Pt POV stead gait- c/o cough, hoarseness x3 days. Pt reports while at home having one episode dark stool diarrhea last night. Denies abd pain. Denies weakness. Denies blood thinners.

## 2022-03-19 NOTE — Discharge Instructions (Signed)
You are seen today in the emergency department for cough and black stool.  The Pepto-Bismol most likely cause your stool dark there is no blood in your feces.  Drink plenty of fluids, take the steroids as prescribed over the next few days to help with the coughing as well as for the underlying COPD.  You have RSV which is a virus which will resolve on its own but the steroids will help.  Can take Tessalon Perles every 8 hours as needed for cough.  Follow-up with your doctor on Monday or Tuesday for reevaluation make sure you are improving.  Return to the ED for shortness of breath, abdominal pain, new or concerning symptoms or chest pain.

## 2022-03-20 ENCOUNTER — Telehealth (HOSPITAL_BASED_OUTPATIENT_CLINIC_OR_DEPARTMENT_OTHER): Payer: Self-pay | Admitting: Emergency Medicine

## 2022-03-20 MED ORDER — BENZONATATE 100 MG PO CAPS: 100.0000 mg | ORAL_CAPSULE | Freq: Three times a day (TID) | ORAL | 0 refills | Status: DC | PRN

## 2022-03-20 MED ORDER — PREDNISONE 10 MG (21) PO TBPK: ORAL_TABLET | Freq: Every day | ORAL | 0 refills | Status: DC

## 2022-03-20 NOTE — Telephone Encounter (Signed)
Patient would like all meds sent to the [facility].

## 2022-03-20 NOTE — Telephone Encounter (Signed)
Patient changing her mind and would like meds sent to alternate pharmacy. Will change.

## 2022-03-22 ENCOUNTER — Telehealth: Payer: Self-pay | Admitting: Family

## 2022-03-22 NOTE — Telephone Encounter (Signed)
Patient notified of provider's advise. She wrote the name of medication down as delsym and will go get at the pharmacy she will call if no improvement

## 2022-03-22 NOTE — Telephone Encounter (Signed)
It looks like they gave her tessalon, if that is not helping, she can try otc delsym. If symptoms worsen or do not improve, please schedule an office visit.

## 2022-03-22 NOTE — Telephone Encounter (Signed)
Patient called to follow up on what she can do about the coughing. She went to ED on Friday for RSV. She still has medication from ED but wants to know how to stop the coughing. Please call to advise.

## 2022-03-22 NOTE — Telephone Encounter (Signed)
Prescription Request  03/22/2022  Is this a "Controlled Substance" medicine? Yes  LOV: 02/05/2022  What is the name of the medication or equipment?   diazepam (VALIUM) 10 MG tablet [[id number]]   Have you contacted your pharmacy to request a refill? No   Which pharmacy would you like this sent to?   [facility] - [place], [place] - [address] [place] [address] Phone: [phone] Fax: [phone]   Patient notified that their request is being sent to the clinical staff for review and that they should receive a response within 2 business days.   Please advise at Mobile [phone] (mobile)

## 2022-03-23 ENCOUNTER — Other Ambulatory Visit: Payer: Self-pay

## 2022-03-23 ENCOUNTER — Telehealth: Payer: Self-pay | Admitting: Family

## 2022-03-23 MED ORDER — DIAZEPAM 10 MG PO TABS: ORAL_TABLET | ORAL | 0 refills | Status: DC

## 2022-03-23 MED ORDER — BENZONATATE 100 MG PO CAPS: 100.0000 mg | ORAL_CAPSULE | Freq: Three times a day (TID) | ORAL | 0 refills | Status: DC | PRN

## 2022-03-23 MED ORDER — PREDNISONE 10 MG (21) PO TBPK: ORAL_TABLET | Freq: Every day | ORAL | 0 refills | Status: DC

## 2022-03-23 NOTE — Telephone Encounter (Signed)
Pt stated she wanted rx given at the ER switched to a different pharmacy so she doesn't have to go pick it up. Pcp made aware.   benzonatate (TESSALON) 100 MG capsule predniSONE (STERAPRED UNI-PAK 21 TAB) 10 MG (21) TBPK tablet   [facility] - [place], [place] - [address], [place] [address] Phone: [phone]  Fax: [phone]

## 2022-03-23 NOTE — Telephone Encounter (Signed)
Patient was notified by front desk

## 2022-03-23 NOTE — Telephone Encounter (Signed)
Rx has been sent to [facility].

## 2022-03-31 ENCOUNTER — Ambulatory Visit (HOSPITAL_BASED_OUTPATIENT_CLINIC_OR_DEPARTMENT_OTHER): Payer: Medicare PPO | Attending: Family

## 2022-04-02 ENCOUNTER — Telehealth: Payer: Self-pay

## 2022-04-02 NOTE — Telephone Encounter (Signed)
Caller Name Ilka Lovick Caller Phone Number [phone] Call Type Message Only Information Provided Reason for Call Returning a Call from the Office Initial Comment Caller states she is returning a call Additional Comment Provided office hours Disp. Time Disposition Final User 04/01/2022 5:25:00 PM General Information Provided Yes Laural Benes, [place] Call Closed By: Lita Mains Transaction Date/Time: 04/01/2022 5:21:56 PM (ET)

## 2022-04-02 NOTE — Telephone Encounter (Signed)
I have not call patient and I did not see any messages or reason for a call.  Called patient to let her know bur no answer

## 2022-04-10 ENCOUNTER — Other Ambulatory Visit (HOSPITAL_BASED_OUTPATIENT_CLINIC_OR_DEPARTMENT_OTHER): Payer: Medicare PPO

## 2022-04-12 ENCOUNTER — Other Ambulatory Visit: Payer: Self-pay | Admitting: Family

## 2022-04-12 NOTE — Telephone Encounter (Signed)
Requesting: Valium Contract: 03/14/2020 UDS: 03/05/2021 Last Visit: 03/12/2022 Next Visit: N/A Last Refill: 03/23/2022  Please Advise

## 2022-04-23 ENCOUNTER — Other Ambulatory Visit: Payer: Self-pay | Admitting: Family

## 2022-05-04 ENCOUNTER — Telehealth: Payer: Self-pay | Admitting: Family

## 2022-05-04 NOTE — Telephone Encounter (Signed)
Copied from [place] [phone]. Topic: Medicare AWV >> May 04, 2022  1:41 PM Devoria Glassing wrote: Reason for CRM: Left message for patient to schedule Annual Wellness Visit(AWV).  Please schedule with Health Nurse Advisor at [facility]. Please call [phone] ask for [facility].

## 2022-05-24 ENCOUNTER — Other Ambulatory Visit: Payer: Self-pay | Admitting: Family

## 2022-05-26 ENCOUNTER — Other Ambulatory Visit: Payer: Self-pay | Admitting: Family

## 2022-05-26 DIAGNOSIS — R42 Dizziness and giddiness: Secondary | ICD-10-CM

## 2022-06-04 ENCOUNTER — Telehealth: Payer: Medicare PPO | Admitting: Family

## 2022-06-09 ENCOUNTER — Ambulatory Visit (INDEPENDENT_AMBULATORY_CARE_PROVIDER_SITE_OTHER): Payer: Medicare PPO | Admitting: *Deleted

## 2022-06-09 DIAGNOSIS — Z0000 Encounter for general adult medical examination without abnormal findings: Secondary | ICD-10-CM

## 2022-06-09 DIAGNOSIS — Z780 Asymptomatic menopausal state: Secondary | ICD-10-CM

## 2022-06-09 DIAGNOSIS — Z1231 Encounter for screening mammogram for malignant neoplasm of breast: Secondary | ICD-10-CM | POA: Diagnosis not present

## 2022-06-09 NOTE — Progress Notes (Signed)
Subjective:   Veronica Wolf is a 82 y.o. female who presents for Medicare Annual (Subsequent) preventive examination.  I connected with  Veronica Wolf on 06/09/22 by a audio enabled telemedicine application and verified that I am speaking with the correct person using two identifiers.  Patient Location: Home  Provider Location: Office/Clinic  I discussed the limitations of evaluation and management by telemedicine. The patient expressed understanding and agreed to proceed.   Review of Systems     Cardiac Risk Factors include: advanced age (>62mn, >>40women);diabetes mellitus;dyslipidemia;hypertension;obesity (BMI >30kg/m2)     Objective:    There were no vitals filed for this visit. There is no height or weight on file to calculate BMI.     06/09/2022    3:02 PM 03/19/2022    4:14 PM 01/27/2022    8:41 AM 11/06/2021    4:04 PM 05/13/2021   12:15 PM 04/21/2021    3:13 PM 04/13/2021    7:55 AM  Advanced Directives  Does Patient Have a Medical Advance Directive? No No Yes No No No No  Type of Advance Directive   Living will      Does patient want to make changes to medical advance directive?   No - Patient declined      Would patient like information on creating a medical advance directive? No - Patient declined    No - Patient declined  Yes (MAU/Ambulatory/Procedural Areas - Information given)    Current Medications (verified) Outpatient Encounter Medications as of 06/09/2022  Medication Sig   albuterol (PROVENTIL) (2.5 MG/3ML) 0.083% nebulizer solution USE 1 VIAL IN NEBULIZER EVERY 6 HOURS AS NEEDED FOR WHEEZING AND FOR SHORTNESS OF BREATH (Patient not taking: Reported on 03/12/2022)   albuterol (VENTOLIN HFA) 108 (90 Base) MCG/ACT inhaler Inhale 2 puffs into the lungs every 6 (six) hours as needed for wheezing or shortness of breath.   allopurinol (ZYLOPRIM) 100 MG tablet TAKE TWO TABLETS BY MOUTH ONCE DAILY   aspirin 81 MG EC tablet Take 1 tablet (81 mg total) by mouth daily.  Swallow whole.   atorvastatin (LIPITOR) 80 MG tablet TAKE ONE TABLET BY MOUTH EVERYDAY AT BEDTIME   colchicine 0.6 MG tablet Take 0.6 mg by mouth daily as needed (gout). (Patient not taking: Reported on 03/12/2022)   diazepam (VALIUM) 10 MG tablet TAKE ONE TABLET BY MOUTH EVERY TWELVE HOURS AS NEEDED FOR ANXIETY   dicyclomine (BENTYL) 10 MG capsule Take 1 capsule (10 mg total) by mouth in the morning, at noon, in the evening, and at bedtime. (Patient not taking: Reported on 03/12/2022)   escitalopram (LEXAPRO) 20 MG tablet TAKE ONE TABLET BY MOUTH EVERY MORNING   ezetimibe (ZETIA) 10 MG tablet TAKE ONE TABLET BY MOUTH ONCE DAILY   famotidine (PEPCID) 20 MG tablet TAKE ONE TABLET BY MOUTH EVERY MORNING and TAKE ONE TABLET BY MOUTH EVERYDAY AT BEDTIME   isosorbide mononitrate (IMDUR) 60 MG 24 hr tablet Take 1 tablet (60 mg total) by mouth daily.   levothyroxine (SYNTHROID) 25 MCG tablet TAKE ONE TABLET BY MOUTH EVERY MORNING   lisinopril (ZESTRIL) 2.5 MG tablet TAKE ONE TABLET BY MOUTH EVERYDAY AT BEDTIME   meclizine (ANTIVERT) 25 MG tablet TAKE ONE TABLET BY MOUTH twice daily AS NEEDED FOR dizziness   metoprolol succinate (TOPROL-XL) 25 MG 24 hr tablet Take 0.5 tablets by mouth daily.   nitroGLYCERIN (NITROSTAT) 0.4 MG SL tablet Place 1 tablet (0.4 mg total) under the tongue every 5 (five) minutes as  needed for chest pain.   nystatin (MYCOSTATIN/NYSTOP) powder APPLY powder TOPICALLY TO THE AFFECTED AREA(S) TWICE DAILY   pantoprazole (PROTONIX) 40 MG tablet TAKE ONE TABLET BY MOUTH TWICE DAILY   potassium chloride SA (KLOR-CON M) 20 MEQ tablet TAKE ONE TABLET BY MOUTH AT NOON   Respiratory Therapy Supplies (NEBULIZER/TUBING/MOUTHPIECE) KIT Use as directed   sucralfate (CARAFATE) 1 g tablet TAKE ONE TABLET BY MOUTH FOUR TIMES DAILY. MAY cut pill in half AND DISSOLVE in water prior TO drinking (Patient not taking: Reported on 03/12/2022)   [DISCONTINUED] benzonatate (TESSALON) 100 MG capsule Take 1  capsule (100 mg total) by mouth 3 (three) times daily as needed for cough.   [DISCONTINUED] predniSONE (STERAPRED UNI-PAK 21 TAB) 10 MG (21) TBPK tablet Take by mouth daily. Take 6 tabs by mouth daily  for 2 days, then 5 tabs for 2 days, then 4 tabs for 2 days, then 3 tabs for 2 days, 2 tabs for 2 days, then 1 tab by mouth daily for 2 days   No facility-administered encounter medications on file as of 06/09/2022.    Allergies (verified) Ibuprofen   History: Past Medical History:  Diagnosis Date   Allergic rhinitis    Allergy    seasonal allergies   Anxiety    CKD (chronic kidney disease) 04/04/2017   COPD (chronic obstructive pulmonary disease) (HCC)    uses inhaler   Depression    Fatty liver    GERD (gastroesophageal reflux disease)    Gout    hx of   Hyperlipidemia    on meds   Hypertension    on meds   Myocardial infarction [facility])    Peptic ulcer 01/04/2003   Vertigo    Past Surgical History:  Procedure Laterality Date   ABDOMINAL HYSTERECTOMY     APPENDECTOMY     BIOPSY  08/13/2020   Procedure: BIOPSY;  Surgeon: Lavena Bullion, DO;  Location: [facility];  Service: Gastroenterology;;   ESOPHAGOGASTRODUODENOSCOPY (EGD) WITH PROPOFOL N/A 08/13/2020   Procedure: ESOPHAGOGASTRODUODENOSCOPY (EGD) WITH PROPOFOL;  Surgeon: Lavena Bullion, DO;  Location: [facility];  Service: Gastroenterology;  Laterality: N/A;   INTRAVASCULAR PRESSURE WIRE/FFR STUDY N/A 01/27/2022   Procedure: INTRAVASCULAR PRESSURE WIRE/FFR STUDY;  Surgeon: Early Osmond, MD;  Location: [facility];  Service: Cardiovascular;  Laterality: N/A;   LEFT HEART CATH AND CORONARY ANGIOGRAPHY N/A 06/13/2017   Procedure: LEFT HEART CATH AND CORONARY ANGIOGRAPHY;  Surgeon: Leonie Man, MD;  Location: [place] [facility];  Service: Cardiovascular;  Laterality: N/A;   LEFT HEART CATH AND CORONARY ANGIOGRAPHY N/A 01/27/2022   Procedure: LEFT HEART CATH AND CORONARY ANGIOGRAPHY;  Surgeon: Early Osmond, MD;  Location: [place];  Service: Cardiovascular;  Laterality: N/A;   RIGHT/LEFT HEART CATH AND CORONARY ANGIOGRAPHY N/A 09/16/2017   Procedure: RIGHT/LEFT HEART CATH AND CORONARY ANGIOGRAPHY;  Surgeon: Martinique, Peter M, MD;  Location: [facility];  Service: Cardiovascular;  Laterality: N/A;   SAVORY DILATION N/A 08/13/2020   Procedure: SAVORY DILATION;  Surgeon: Lavena Bullion, DO;  Location: [facility];  Service: Gastroenterology;  Laterality: N/A;   ULTRASOUND GUIDANCE FOR VASCULAR ACCESS  09/16/2017   Procedure: Ultrasound Guidance For Vascular Access;  Surgeon: Martinique, Peter M, MD;  Location: [facility];  Service: Cardiovascular;;   WISDOM TOOTH EXTRACTION     Family History  Problem Relation Age of Onset   Breast cancer Mother    Stroke Father  Stomach cancer Neg Hx    Colon cancer Neg Hx    Pancreatic cancer Neg Hx    Esophageal cancer Neg Hx    Colon polyps Neg Hx    Rectal cancer Neg Hx    Social History   Socioeconomic History   Marital status: Widowed    Spouse name: Not on file   Number of children: 1   Years of education: 36   Highest education level: 12th grade  Occupational History   Occupation: Retired  Tobacco Use   Smoking status: Former    Types: Cigarettes    Quit date: 04/24/2000    Years since quitting: 22.1    Passive exposure: Past   Smokeless tobacco: Never  Vaping Use   Vaping Use: Never used  Substance and Sexual Activity   Alcohol use: Not Currently    Comment: has previous hx of ETOH abuse, quit 2014   Drug use: No   Sexual activity: Not Currently  Other Topics Concern   Not on file  Social History Narrative   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[place]   Grew up in [place]   Has daughter locally   Social Determinants of Health   Financial Resource Strain: [place]  (10/07/2021)   Overall Financial Resource Strain (CARDIA)    Difficulty of Paying Living Expenses: Not very hard  Food Insecurity:  Food Insecurity Present (06/09/2022)   Hunger Vital Sign    Worried About Running Out of Food in the Last Year: Sometimes true    Ran Out of Food in the Last Year: Never true  Transportation Needs: Unmet Transportation Needs (06/09/2022)   PRAPARE - Hydrologist (Medical): Yes    Lack of Transportation (Non-Medical): Yes  Physical Activity: Inactive (05/13/2021)   Exercise Vital Sign    Days of Exercise per Week: 0 days    Minutes of Exercise per Session: 0 min  Stress: Stress Concern Present (05/13/2021)   [place] of Stress : Rather much  Social Connections: Moderately Isolated (05/13/2021)   Social Connection and Isolation Panel [NHANES]    Frequency of Communication with Friends and Family: More than three times a week    Frequency of Social Gatherings with Friends and Family: More than three times a week    Attends Religious Services: 1 to 4 times per year    Active Member of [employer] or Organizations: No    Attends Archivist Meetings: Never    Marital Status: Widowed    Tobacco Counseling Counseling given: Not Answered   Clinical Intake:  Pre-visit preparation completed: Yes  Pain : No/denies pain  Nutritional Risks: None Diabetes: Yes CBG done?: No Did pt. bring in CBG monitor from home?: No  How often do you need to have someone help you when you read instructions, pamphlets, or other written materials from your doctor or pharmacy?: 1 - Never   Activities of Daily Living    06/09/2022    3:08 PM  In your present state of health, do you have any difficulty performing the following activities:  Hearing? 1  Comment difficulty hearing  Vision? 0  Difficulty concentrating or making decisions? 1  Walking or climbing stairs? 0  Dressing or bathing? 0  Doing errands, shopping? 1  Preparing Food and eating ? N  Using the Toilet? N  In the past six months, have  you accidently leaked urine?  Y  Do you have problems with loss of bowel control? N  Managing your Medications? N  Managing your Finances? N  Housekeeping or managing your Housekeeping? N    Patient Care Team: Debbrah Alar, NP as PCP - General (Internal Medicine) Berniece Salines, DO as PCP - Cardiology (Cardiology) Cherre Robins, RPH-CPP (Pharmacist)  Indicate any recent Medical Services you may have received from other than [facility] providers in the past year (date may be approximate).     Assessment:   This is a routine wellness examination for [place].  Hearing/Vision screen No results found.  Dietary issues and exercise activities discussed: Current Exercise Habits: The patient does not participate in regular exercise at present, Exercise limited by: None identified   Goals Addressed   None    Depression Screen    06/09/2022    3:07 PM 02/05/2022   11:04 AM 08/26/2021    8:28 AM 05/13/2021   12:08 PM 04/13/2021    8:06 AM 03/06/2021   11:58 AM 12/03/2020   10:03 AM  PHQ 2/9 Scores  PHQ - 2 Score 0 0 '1 2 1 3 3  '$ PHQ- 9 Score    '6  9 8    '$ Fall Risk    06/09/2022    3:03 PM 02/05/2022   11:04 AM 08/26/2021    8:29 AM 05/13/2021   12:13 PM 04/13/2021    8:02 AM  Fall Risk   Falls in the past year? 0 1 1 0 0  Number falls in past yr: 0 1 0 0 0  Injury with Fall? 0 1 0 0 0  Risk for fall due to : No Fall Risks History of fall(s);Impaired balance/gait  No Fall Risks   Follow up Falls evaluation completed Falls evaluation completed;Falls prevention discussed  Falls evaluation completed;Education provided;Falls prevention discussed Falls prevention discussed    FALL RISK PREVENTION PERTAINING TO THE HOME:  Any stairs in or around the home? Yes  If so, are there any without handrails? No  Home free of loose throw rugs in walkways, pet beds, electrical cords, etc? Yes  Adequate lighting in your home to reduce risk of falls? Yes   ASSISTIVE DEVICES UTILIZED TO PREVENT FALLS:  Life  alert? No  Use of a cane, walker or w/c? No  Grab bars in the bathroom? No  Shower chair or bench in shower? No  Elevated toilet seat or a handicapped toilet? No   TIMED UP AND GO:  Was the test performed?  No, audio visit .    Cognitive Function:    06/09/2022    3:17 PM  MMSE - Mini Mental State Exam  Not completed: Unable to complete        Immunizations Immunization History  Administered Date(s) Administered   Fluad Quad(high Dose 65+) 03/14/2020, 12/03/2020   Influenza, High Dose Seasonal PF 01/23/2018, 12/07/2018   Influenza-Unspecified 12/23/2018   PFIZER(Purple Top)SARS-COV-2 Vaccination 05/14/2019, 06/08/2019   Pneumococcal Conjugate-13 07/16/2015   Pneumococcal Polysaccharide-23 12/23/2016   Tdap 09/02/2009, 04/05/2017    TDAP status: Up to date  Flu Vaccine status: Declined, Education has been provided regarding the importance of this vaccine but patient still declined. Advised may receive this vaccine at local pharmacy or Health Dept. Aware to provide a copy of the vaccination record if obtained from local pharmacy or Health Dept. Verbalized acceptance and understanding.  Pneumococcal vaccine status: Up to date  Covid-19 vaccine status: Information provided on how to obtain vaccines.   Qualifies for Shingles Vaccine?  Yes   Zostavax completed No   Shingrix Completed?: No.    Education has been provided regarding the importance of this vaccine. Patient has been advised to call insurance company to determine out of pocket expense if they have not yet received this vaccine. Advised may also receive vaccine at local pharmacy or Health Dept. Verbalized acceptance and understanding.  Screening Tests Health Maintenance  Topic Date Due   FOOT EXAM  Never done   OPHTHALMOLOGY EXAM  Never done   Zoster Vaccines- Shingrix (1 of 2) Never done   COVID-19 Vaccine (3 - Pfizer risk series) 07/06/2019   Diabetic kidney evaluation - Urine ACR  03/05/2022   Medicare Annual  Wellness (AWV)  04/13/2022   INFLUENZA VACCINE  07/04/2022 (Originally 11/03/2021)   HEMOGLOBIN A1C  08/06/2022   Diabetic kidney evaluation - eGFR measurement  03/20/2023   DTaP/Tdap/Td (3 - Td or Tdap) 04/06/2027   Pneumonia Vaccine 72+ Years old  Completed   DEXA SCAN  Completed   HPV VACCINES  Aged Out    Health Maintenance  Health Maintenance Due  Topic Date Due   FOOT EXAM  Never done   OPHTHALMOLOGY EXAM  Never done   Zoster Vaccines- Shingrix (1 of 2) Never done   COVID-19 Vaccine (3 - Pfizer risk series) 07/06/2019   Diabetic kidney evaluation - Urine ACR  03/05/2022   Medicare Annual Wellness (AWV)  04/13/2022    Colorectal cancer screening: No longer required.   Mammogram status: Ordered 06/09/22. Pt provided with contact info and advised to call to schedule appt.   Bone Density status: Ordered 06/09/22. Pt provided with contact info and advised to call to schedule appt.  Lung Cancer Screening: (Low Dose CT Chest recommended if Age 37-80 years, 30 pack-year currently smoking OR have quit w/in 15years.) does not qualify.   Additional Screening:  Hepatitis C Screening: does not qualify  Vision Screening: Recommended annual ophthalmology exams for early detection of glaucoma and other disorders of the eye. Is the patient up to date with their annual eye exam?  No  Who is the provider or what is the name of the office in which the patient attends annual eye exams? Doesn't have eye doctor currently If pt is not established with a provider, would they like to be referred to a provider to establish care? No .   Dental Screening: Recommended annual dental exams for proper oral hygiene  Community Resource Referral / Chronic Care Management: CRR required this visit?  No   CCM required this visit?  No      Plan:     I have personally reviewed and noted the following in the patient's chart:   Medical and social history Use of alcohol, tobacco or illicit drugs  Current  medications and supplements including opioid prescriptions. Patient is not currently taking opioid prescriptions. Functional ability and status Nutritional status Physical activity Advanced directives List of other physicians Hospitalizations, surgeries, and ER visits in previous 12 months Vitals Screenings to include cognitive, depression, and falls Referrals and appointments  In addition, I have reviewed and discussed with patient certain preventive protocols, quality metrics, and best practice recommendations. A written personalized care plan for preventive services as well as general preventive health recommendations were provided to patient.   Due to this being a telephonic visit, the after visit summary with patients personalized plan was offered to patient via mail or my-chart. Per request, patient was mailed a copy of AVS..   Beatris Ship, [place]  06/09/2022   Nurse Notes: None

## 2022-06-09 NOTE — Patient Instructions (Signed)
[name] , Thank you for taking time to come for your Medicare Wellness Visit. I appreciate your ongoing commitment to your health goals. Please review the following plan we discussed and let me know if I can assist you in the future.     This is a list of the screening recommended for you and due dates:  Health Maintenance  Topic Date Due   Complete foot exam   Never done   Eye exam for diabetics  Never done   Zoster (Shingles) Vaccine (1 of 2) Never done   COVID-19 Vaccine (3 - Pfizer risk series) 07/06/2019   Yearly kidney health urinalysis for diabetes  03/05/2022   Flu Shot  07/04/2022*   Hemoglobin A1C  08/06/2022   Yearly kidney function blood test for diabetes  03/20/2023   Medicare Annual Wellness Visit  06/09/2023   DTaP/Tdap/Td vaccine (3 - Td or Tdap) 04/06/2027   Pneumonia Vaccine  Completed   DEXA scan (bone density measurement)  Completed   HPV Vaccine  Aged Out  *Topic was postponed. The date shown is not the original due date.     Next appointment: Follow up in one year for your annual wellness visit.   Preventive Care 29 Years and Older, Female Preventive care refers to lifestyle choices and visits with your health care provider that can promote health and wellness. What does preventive care include? A yearly physical exam. This is also called an annual well check. Dental exams once or twice a year. Routine eye exams. Ask your health care provider how often you should have your eyes checked. Personal lifestyle choices, including: Daily care of your teeth and gums. Regular physical activity. Eating a healthy diet. Avoiding tobacco and drug use. Limiting alcohol use. Practicing safe sex. Taking low-dose aspirin every day. Taking vitamin and mineral supplements as recommended by your health care provider. What happens during an annual well check? The services and screenings done by your health care provider during your annual well check will depend on your  age, overall health, lifestyle risk factors, and family history of disease. Counseling  Your health care provider may ask you questions about your: Alcohol use. Tobacco use. Drug use. Emotional well-being. Home and relationship well-being. Sexual activity. Eating habits. History of falls. Memory and ability to understand (cognition). Work and work Statistician. Reproductive health. Screening  You may have the following tests or measurements: Height, weight, and BMI. Blood pressure. Lipid and cholesterol levels. These may be checked every 5 years, or more frequently if you are over 83 years old. Skin check. Lung cancer screening. You may have this screening every year starting at age 83 if you have a 30-pack-year history of smoking and currently smoke or have quit within the past 15 years. Fecal occult blood test (FOBT) of the stool. You may have this test every year starting at age 83. Flexible sigmoidoscopy or colonoscopy. You may have a sigmoidoscopy every 5 years or a colonoscopy every 10 years starting at age 57. Hepatitis C blood test. Hepatitis B blood test. Sexually transmitted disease (STD) testing. Diabetes screening. This is done by checking your blood sugar (glucose) after you have not eaten for a while (fasting). You may have this done every 83-3 years. Bone density scan. This is done to screen for osteoporosis. You may have this done starting at age 83. Mammogram. This may be done every 1-2 years. Talk to your health care provider about how often you should have regular mammograms. Talk with your  health care provider about your test results, treatment options, and if necessary, the need for more tests. Vaccines  Your health care provider may recommend certain vaccines, such as: Influenza vaccine. This is recommended every year. Tetanus, diphtheria, and acellular pertussis (Tdap, Td) vaccine. You may need a Td booster every 10 years. Zoster vaccine. You may need this after  age 44. Pneumococcal 13-valent conjugate (PCV13) vaccine. One dose is recommended after age 83. Pneumococcal polysaccharide (PPSV23) vaccine. One dose is recommended after age 83. Talk to your health care provider about which screenings and vaccines you need and how often you need them. This information is not intended to replace advice given to you by your health care provider. Make sure you discuss any questions you have with your health care provider. Document Released: 04/18/2015 Document Revised: 12/10/2015 Document Reviewed: 01/21/2015 Elsevier Interactive Patient Education  2017 [place] Prevention in the Home Falls can cause injuries. They can happen to people of all ages. There are many things you can do to make your home safe and to help prevent falls. What can I do on the outside of my home? Regularly fix the edges of walkways and driveways and fix any cracks. Remove anything that might make you trip as you walk through a door, such as a raised step or threshold. Trim any bushes or trees on the path to your home. Use bright outdoor lighting. Clear any walking paths of anything that might make someone trip, such as rocks or tools. Regularly check to see if handrails are loose or broken. Make sure that both sides of any steps have handrails. Any raised decks and porches should have guardrails on the edges. Have any leaves, snow, or ice cleared regularly. Use sand or salt on walking paths during winter. Clean up any spills in your garage right away. This includes oil or grease spills. What can I do in the bathroom? Use night lights. Install grab bars by the toilet and in the tub and shower. Do not use towel bars as grab bars. Use non-skid mats or decals in the tub or shower. If you need to sit down in the shower, use a plastic, non-slip stool. Keep the floor dry. Clean up any water that spills on the floor as soon as it happens. Remove soap buildup in the tub or shower  regularly. Attach bath mats securely with double-sided non-slip rug tape. Do not have throw rugs and other things on the floor that can make you trip. What can I do in the bedroom? Use night lights. Make sure that you have a light by your bed that is easy to reach. Do not use any sheets or blankets that are too big for your bed. They should not hang down onto the floor. Have a firm chair that has side arms. You can use this for support while you get dressed. Do not have throw rugs and other things on the floor that can make you trip. What can I do in the kitchen? Clean up any spills right away. Avoid walking on wet floors. Keep items that you use a lot in easy-to-reach places. If you need to reach something above you, use a strong step stool that has a grab bar. Keep electrical cords out of the way. Do not use floor polish or wax that makes floors slippery. If you must use wax, use non-skid floor wax. Do not have throw rugs and other things on the floor that can make you trip. What  can I do with my stairs? Do not leave any items on the stairs. Make sure that there are handrails on both sides of the stairs and use them. Fix handrails that are broken or loose. Make sure that handrails are as long as the stairways. Check any carpeting to make sure that it is firmly attached to the stairs. Fix any carpet that is loose or worn. Avoid having throw rugs at the top or bottom of the stairs. If you do have throw rugs, attach them to the floor with carpet tape. Make sure that you have a light switch at the top of the stairs and the bottom of the stairs. If you do not have them, ask someone to add them for you. What else can I do to help prevent falls? Wear shoes that: Do not have high heels. Have rubber bottoms. Are comfortable and fit you well. Are closed at the toe. Do not wear sandals. If you use a stepladder: Make sure that it is fully opened. Do not climb a closed stepladder. Make sure that  both sides of the stepladder are locked into place. Ask someone to hold it for you, if possible. Clearly mark and make sure that you can see: Any grab bars or handrails. First and last steps. Where the edge of each step is. Use tools that help you move around (mobility aids) if they are needed. These include: Canes. Walkers. Scooters. Crutches. Turn on the lights when you go into a dark area. Replace any light bulbs as soon as they burn out. Set up your furniture so you have a clear path. Avoid moving your furniture around. If any of your floors are uneven, fix them. If there are any pets around you, be aware of where they are. Review your medicines with your doctor. Some medicines can make you feel dizzy. This can increase your chance of falling. Ask your doctor what other things that you can do to help prevent falls. This information is not intended to replace advice given to you by your health care provider. Make sure you discuss any questions you have with your health care provider. Document Released: 01/16/2009 Document Revised: 08/28/2015 Document Reviewed: 04/26/2014 Elsevier Interactive Patient Education  2017 [employer].

## 2022-06-15 ENCOUNTER — Telehealth: Payer: Self-pay

## 2022-06-15 NOTE — Telephone Encounter (Signed)
Patient scheduled to come in tomorrow at 12:20. Patient advised to arrive at 12 noon. LVM for daughter Sharee Pimple to be aware.

## 2022-06-15 NOTE — Telephone Encounter (Signed)
Initial Comment Caller states that she has a yeast infection and would like to get something for that. Caller has vaginal pain at this time Translation No Disp. Time Eilene Ghazi Time) Disposition Final User 06/14/2022 5:49:23 PM Send To RN Personal Luana Shu, Bradi 06/14/2022 6:00:48 PM Attempt made - message left Lissa Merlin, RN, Abigail 06/14/2022 6:02:38 PM Send To RN Personal Lissa Merlin, RN, Abigail 06/14/2022 6:11:08 PM FINAL ATTEMPT MADE - message left Yes Aurelio Jew, RN, [place] Final Disposition 06/14/2022 6:11:08 PM FINAL ATTEMPT MADE - message left

## 2022-06-15 NOTE — Telephone Encounter (Signed)
Initial Comment Caller stated that they have a yeast infection, is urinating every few minutes, is painful. Additional Comment No 2nd number. Translation No Disp. Time Eilene Ghazi Time) Disposition Final User 06/14/2022 5:12:57 PM Attempt made - no message left [place], RN, [place] 06/14/2022 5:23:27 PM Attempt made - no message left [place]unch, RN, Lilly 06/14/2022 5:34:38 PM FINAL ATTEMPT MADE - no message left Yes Merlyn Lot, RN, [place] Final Disposition 06/14/2022 5:34:38 PM FINAL ATTEMPT MADE - no message left Yes Merlyn Lot, RN, [employer]

## 2022-06-16 ENCOUNTER — Other Ambulatory Visit: Payer: Self-pay | Admitting: Family

## 2022-06-16 ENCOUNTER — Ambulatory Visit: Payer: Medicare PPO | Admitting: Family

## 2022-06-23 ENCOUNTER — Other Ambulatory Visit: Payer: Self-pay | Admitting: Family

## 2022-06-23 DIAGNOSIS — R42 Dizziness and giddiness: Secondary | ICD-10-CM

## 2022-06-24 ENCOUNTER — Other Ambulatory Visit (HOSPITAL_BASED_OUTPATIENT_CLINIC_OR_DEPARTMENT_OTHER): Payer: Medicare PPO

## 2022-06-24 ENCOUNTER — Inpatient Hospital Stay (HOSPITAL_BASED_OUTPATIENT_CLINIC_OR_DEPARTMENT_OTHER): Admission: RE | Admit: 2022-06-24 | Payer: Medicare PPO | Source: Ambulatory Visit

## 2022-06-25 ENCOUNTER — Other Ambulatory Visit: Payer: Self-pay | Admitting: Family

## 2022-06-25 NOTE — Telephone Encounter (Signed)
Requesting: diazepam 10mg   Contract: 03/14/20  UDS: 03/05/21 Last Visit: 02/05/22 Next Visit: None Last Refill: 05/25/22 #60 and 0RF   Please Advise

## 2022-06-28 ENCOUNTER — Telehealth: Payer: Self-pay

## 2022-06-28 NOTE — Telephone Encounter (Signed)
Caller Name [place] Phone Number [phone] Patient Name Veronica Wolf Patient DOB 18-Feb-1941 Call Type Message Only Information Provided Reason for Call Request for General Office Information Initial Comment Caller states she would like to know which day her appt is schedule for. Additional Comment Please call the pt back when the office opens. Office hours provided. Declined triage. Disp. Time Disposition Final User 06/26/2022 8:07:49 AM General Information Provided Yes Phoebe Perch Call Closed By: Phoebe Perch Transaction Date/Time: 06/26/2022 8:01:14 AM (ET)

## 2022-06-29 NOTE — Telephone Encounter (Signed)
Patient notified she did not have a follow up appointment scheduled. She is due for appointment, she was scheduled to come in 07/01/22 at 11:20 am. Advised to be here at 11 am,

## 2022-07-01 ENCOUNTER — Ambulatory Visit: Payer: Medicare PPO | Admitting: Family

## 2022-07-08 ENCOUNTER — Other Ambulatory Visit (HOSPITAL_BASED_OUTPATIENT_CLINIC_OR_DEPARTMENT_OTHER): Payer: Medicare PPO

## 2022-07-08 ENCOUNTER — Telehealth: Payer: Self-pay | Admitting: Family

## 2022-07-08 NOTE — Telephone Encounter (Signed)
Pt called to find out what she can take over the counter to help relieve her lower back pain  that started last night. Pt said she has been using alcohol but it's not helping. Patient would like a call to advise what she can take before tonight so she isn't up all night again.

## 2022-07-10 NOTE — Telephone Encounter (Signed)
I attempted to reach the patient and no answer, voicemail is full.  Please advise pt that unfortunately, the only thing that is safe for her to take is tylenol.

## 2022-07-12 NOTE — Telephone Encounter (Signed)
Patient notified of information on message from provider

## 2022-07-16 ENCOUNTER — Ambulatory Visit: Payer: Medicare PPO | Admitting: Family

## 2022-07-21 ENCOUNTER — Encounter: Payer: Self-pay | Admitting: Family

## 2022-07-21 ENCOUNTER — Ambulatory Visit (HOSPITAL_BASED_OUTPATIENT_CLINIC_OR_DEPARTMENT_OTHER)
Admission: RE | Admit: 2022-07-21 | Discharge: 2022-07-21 | Disposition: A | Discharge status: Home / Self Care | Payer: Medicare PPO | Source: Ambulatory Visit | Attending: Family | Admitting: Family

## 2022-07-21 ENCOUNTER — Other Ambulatory Visit (HOSPITAL_BASED_OUTPATIENT_CLINIC_OR_DEPARTMENT_OTHER): Payer: Medicare PPO

## 2022-07-21 ENCOUNTER — Encounter (HOSPITAL_BASED_OUTPATIENT_CLINIC_OR_DEPARTMENT_OTHER): Payer: Self-pay

## 2022-07-21 ENCOUNTER — Ambulatory Visit (HOSPITAL_BASED_OUTPATIENT_CLINIC_OR_DEPARTMENT_OTHER): Payer: Medicare PPO

## 2022-07-21 DIAGNOSIS — Z780 Asymptomatic menopausal state: Secondary | ICD-10-CM | POA: Insufficient documentation

## 2022-07-21 DIAGNOSIS — Z1231 Encounter for screening mammogram for malignant neoplasm of breast: Secondary | ICD-10-CM | POA: Insufficient documentation

## 2022-07-21 DIAGNOSIS — M85832 Other specified disorders of bone density and structure, left forearm: Secondary | ICD-10-CM | POA: Diagnosis not present

## 2022-07-21 NOTE — Progress Notes (Signed)
Mailed out to pt 

## 2022-07-22 ENCOUNTER — Telehealth: Payer: Self-pay | Admitting: *Deleted

## 2022-07-22 ENCOUNTER — Other Ambulatory Visit: Payer: Self-pay | Admitting: Family

## 2022-07-22 DIAGNOSIS — R42 Dizziness and giddiness: Secondary | ICD-10-CM

## 2022-07-22 NOTE — Telephone Encounter (Signed)
Patient reminded of appointment date and time. She verbalized understanding and is "ok with the appointment"

## 2022-07-22 NOTE — Telephone Encounter (Signed)
Who Is Calling Patient / Member / Family / Caregiver Caller Name [name] Caller Phone Number [phone] Patient Name Veronica Wolf Patient DOB 1940-06-27 Call Type Message Only Information Provided Reason for Call Request for General Office Information Initial Comment Caller states she made an appointment and is calling to see if she can possibly get a earlier time. Additional Comment Provided office hours. Disp. Time Disposition Final User 07/21/2022 5:09:51 PM General Information Provided Yes Daphine Deutscher, [name] Call Closed By: Delanna Notice Transaction Date/Time: 07/21/2022 5:02:56 PM (ET

## 2022-07-26 ENCOUNTER — Other Ambulatory Visit (HOSPITAL_BASED_OUTPATIENT_CLINIC_OR_DEPARTMENT_OTHER): Payer: Self-pay

## 2022-07-26 ENCOUNTER — Ambulatory Visit (INDEPENDENT_AMBULATORY_CARE_PROVIDER_SITE_OTHER): Payer: Medicare PPO | Admitting: Family

## 2022-07-26 ENCOUNTER — Ambulatory Visit: Payer: Medicare PPO | Admitting: Family

## 2022-07-26 VITALS — BP 125/62 | HR 82 | Temp 98.0°F | Resp 16 | Ht 64.0 in | Wt 203.0 lb

## 2022-07-26 DIAGNOSIS — M109 Gout, unspecified: Secondary | ICD-10-CM

## 2022-07-26 DIAGNOSIS — K219 Gastro-esophageal reflux disease without esophagitis: Secondary | ICD-10-CM | POA: Diagnosis not present

## 2022-07-26 DIAGNOSIS — F419 Anxiety disorder, unspecified: Secondary | ICD-10-CM | POA: Diagnosis not present

## 2022-07-26 DIAGNOSIS — J449 Chronic obstructive pulmonary disease, unspecified: Secondary | ICD-10-CM

## 2022-07-26 DIAGNOSIS — E782 Mixed hyperlipidemia: Secondary | ICD-10-CM

## 2022-07-26 DIAGNOSIS — M5432 Sciatica, left side: Secondary | ICD-10-CM

## 2022-07-26 DIAGNOSIS — E039 Hypothyroidism, unspecified: Secondary | ICD-10-CM

## 2022-07-26 DIAGNOSIS — I10 Essential (primary) hypertension: Secondary | ICD-10-CM

## 2022-07-26 DIAGNOSIS — Z79899 Other long term (current) drug therapy: Secondary | ICD-10-CM

## 2022-07-26 DIAGNOSIS — E118 Type 2 diabetes mellitus with unspecified complications: Secondary | ICD-10-CM

## 2022-07-26 LAB — LIPID PANEL
Cholesterol: 134 mg/dL (ref 0–200)
HDL: 43 mg/dL (ref >39.00)
LDL Cholesterol: 62 mg/dL (ref 0–99)
NonHDL: 91.23
Total CHOL/HDL Ratio: 3
Triglycerides: 148 mg/dL (ref 0.0–149.0)
VLDL: 29.6 mg/dL (ref 0.0–40.0)

## 2022-07-26 LAB — TSH: TSH: 5.3 u[IU]/mL (ref 0.35–5.50)

## 2022-07-26 MED ORDER — METHYLPREDNISOLONE 4 MG PO TBPK
ORAL_TABLET | ORAL | 0 refills | Status: DC
  Filled 2022-07-26: qty 21, 6d supply, fill #0

## 2022-07-26 MED ORDER — BLOOD GLUCOSE TEST VI STRP
1.0000 | ORAL_STRIP | Freq: Three times a day (TID) | 0 refills | Status: AC
  Filled 2022-07-26: qty 100, 34d supply, fill #0

## 2022-07-26 MED ORDER — ACCU-CHEK FASTCLIX LANCET KIT
1.0000 | PACK | Freq: Three times a day (TID) | 0 refills | Status: AC
  Filled 2022-07-26 – 2022-12-30 (×2): qty 1, 30d supply, fill #0

## 2022-07-26 MED ORDER — ACCU-CHEK GUIDE ME W/DEVICE KIT
1.0000 | PACK | Freq: Three times a day (TID) | 0 refills | Status: AC
  Filled 2022-07-26 (×2): qty 1, 90d supply, fill #0

## 2022-07-26 MED ORDER — ACCU-CHEK SOFTCLIX LANCETS MISC
1.0000 | Freq: Three times a day (TID) | 0 refills | Status: AC
  Filled 2022-07-26: qty 100, 30d supply, fill #0

## 2022-07-26 NOTE — Assessment & Plan Note (Signed)
Lab Results  Component Value Date   HGBA1C 6.7 (H) 02/05/2022   HGBA1C 6.5 01/09/2021   HGBA1C 6.2 (H) 09/17/2020   Lab Results  Component Value Date   MICROALBUR <0.7 03/05/2021   LDLCALC 57 08/26/2021   CREATININE 1.60 (H) 03/19/2022

## 2022-07-26 NOTE — Assessment & Plan Note (Signed)
Clinically stable. Has albuterol MDI/nebulizer as needed.

## 2022-07-26 NOTE — Progress Notes (Signed)
Subjective:   By signing my name below, I, Veronica Wolf, attest that this documentation has been prepared under the direction and in the presence of Veronica Craze, NP. 07/26/2022   Patient ID: Veronica Wolf, female    DOB: June 16, 1940, 82 y.o.   MRN: [id number]  Chief Complaint  Patient presents with   Hypertension    Here for follow up    Diabetes    Here for follow up    Hypertension  Diabetes   Patient is in today for a follow up visit. She is present with her daughter during this visit.   Left hip pain: She complains of left hip pain that comes and goes for the past 2 years. Her pain radiates down her buttocks. She denies pain in her right hip.  Right shoulder pain: She also complains of shoulder pain. She is not seeing a specialist at this time due to developing it recently.   Diarrhea: Her daughter reports having more diarrhea for the past couple months. She typically has back and forth constipation and diarrhea. She also developed bowel incontinence while sleeping.   Diet: She continues occasionally eating candies at night.   Breathing: Her breathing is stable at this time. She has not used her albuterol inhaler or nebulizer in a while.   Reflux: Her reflux is stable while taking Pepcid and pantoprazole.   Gout: She has no recent gout flare ups. She continues taking 100 mg allopurinol 2x daily PO.  Blood pressure: Her blood pressure is stable during his visit. She continues taking 12.5 mg metoprolol succinate daily PO2.5 mg lisinopril daily PO and reports no new issues while taking them.  BP Readings from Last 3 Encounters:  07/26/22 125/62  03/19/22 (!) 145/95  02/11/22 126/70   Pulse Readings from Last 3 Encounters:  07/26/22 82  03/19/22 87  02/11/22 82   Vision care: She is due at this time.    Past Medical History:  Diagnosis Date   Allergic rhinitis    Allergy    seasonal allergies   Anxiety    CKD (chronic kidney disease) 04/04/2017   COPD  (chronic obstructive pulmonary disease)    uses inhaler   Depression    Fatty liver    GERD (gastroesophageal reflux disease)    Gout    hx of   Hyperlipidemia    on meds   Hypertension    on meds   Myocardial infarction    Peptic ulcer 01/04/2003   Vertigo     Past Surgical History:  Procedure Laterality Date   ABDOMINAL HYSTERECTOMY     APPENDECTOMY     BIOPSY  08/13/2020   Procedure: BIOPSY;  Surgeon: Shellia Cleverly, DO;  Location: [facility];  Service: Gastroenterology;;   CORONARY PRESSURE/FFR STUDY N/A 01/27/2022   Procedure: INTRAVASCULAR PRESSURE WIRE/FFR STUDY;  Surgeon: Orbie Pyo, MD;  Location: [facility] CV [facility];  Service: Cardiovascular;  Laterality: N/A;   ESOPHAGOGASTRODUODENOSCOPY (EGD) WITH PROPOFOL N/A 08/13/2020   Procedure: ESOPHAGOGASTRODUODENOSCOPY (EGD) WITH PROPOFOL;  Surgeon: Shellia Cleverly, DO;  Location: [facility];  Service: Gastroenterology;  Laterality: N/A;   LEFT HEART CATH AND CORONARY ANGIOGRAPHY N/A 06/13/2017   Procedure: LEFT HEART CATH AND CORONARY ANGIOGRAPHY;  Surgeon: Marykay Lex, MD;  Location: [facility] INVAS[facility] CV LAB;  Service: Cardiovascular;  Laterality: N/A;   LEFT HEART CATH AND CORONARY ANGIOGRAPHY N/A 01/27/2022   Procedure: LEFT HEART CATH AND CORONARY ANGIOGRAPHY;  Surgeon: Orbie Pyo, MD;  Location:  [facility];  Service: Cardiovascular;  Laterality: N/A;   RIGHT/LEFT HEART CATH AND CORONARY ANGIOGRAPHY N/A 09/16/2017   Procedure: RIGHT/LEFT HEART CATH AND CORONARY ANGIOGRAPHY;  Surgeon: Swaziland, Peter M, MD;  Location: [facility] INVASIVE CV LAB;  Service: Cardiovascular;  Laterality: N/A;   SAVORY DILATION N/A 08/13/2020   Procedure: SAVORY DILATION;  Surgeon: Shellia Cleverly, DO;  Location: [facility];  Service: Gastroenterology;  Laterality: N/A;   ULTRASOUND GUIDANCE FOR VASCULAR ACCESS  09/16/2017   Procedure: Ultrasound Guidance For Vascular Access;  Surgeon: Swaziland, Peter M, MD;  Location: [facility] INVASIVE  CV LAB;  Service: Cardiovascular;;   WISDOM TOOTH EXTRACTION      Family History  Problem Relation Age of Onset   Breast cancer Mother    Stroke Father    Stomach cancer Neg Hx    Colon cancer Neg Hx    Pancreatic cancer Neg Hx    Esophageal cancer Neg Hx    Colon polyps Neg Hx    Rectal cancer Neg Hx     Social History   Socioeconomic History   Marital status: Widowed    Spouse name: Not on file   Number of children: 1   Years of education: 35   Highest education level: 12th grade  Occupational History   Occupation: Retired  Tobacco Use   Smoking status: Former    Types: Cigarettes    Quit date: 04/24/2000    Years since quitting: 22.2    Passive exposure: Past   Smokeless tobacco: Never  Vaping Use   Vaping Use: Never used  Substance and Sexual Activity   Alcohol use: Not Currently    Comment: has previous hx of ETOH abuse, quit 2014   Drug use: No   Sexual activity: Not Currently  Other Topics Concern   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[place]   Grew up in [place]   Has daughter locally   Social Determinants of Health   Financial Resource Strain: Low Risk  (10/07/2021)   Overall Financial Resource Strain (CARDIA)    Difficulty of Paying Living Expenses: Not very hard  Food Insecurity: Food Insecurity Present (06/09/2022)   Hunger Vital Sign    Worried About Running Out of Food in the Last Year: Sometimes true    Ran Out of Food in the Last Year: Never true  Transportation Needs: Unmet Transportation Needs (06/09/2022)   PRAPARE - Administrator, Civil Service (Medical): Yes    Lack of Transportation (Non-Medical): Yes  Physical Activity: Inactive (05/13/2021)   Exercise Vital Sign    Days of Exercise per Week: 0 days    Minutes of Exercise per Session: 0 min  Stress: Stress Concern Present (05/13/2021)   [employer] of Occupational Health - Occupational Stress Questionnaire    Feeling of Stress :  Rather much  Social Connections: Moderately Isolated (05/13/2021)   Social Connection and Isolation Panel [NHANES]    Frequency of Communication with Friends and Family: More than three times a week    Frequency of Social Gatherings with Friends and Family: More than three times a week    Attends Religious Services: 1 to 4 times per year    Active Member of [employer] or Organizations: No    Attends Banker Meetings: Never    Marital Status: Widowed  Intimate Partner Violence: Not At Risk (06/09/2022)   Humiliation, Afraid, Rape, and Kick questionnaire  Fear of Current or Ex-Partner: No    Emotionally Abused: No    Physically Abused: No    Sexually Abused: No    Outpatient Medications Prior to Visit  Medication Sig Dispense Refill   albuterol (PROVENTIL) (2.5 MG/3ML) 0.083% nebulizer solution USE 1 VIAL IN NEBULIZER EVERY 6 HOURS AS NEEDED FOR WHEEZING AND FOR SHORTNESS OF BREATH 150 mL 0   albuterol (VENTOLIN HFA) 108 (90 Base) MCG/ACT inhaler Inhale 2 puffs into the lungs every 6 (six) hours as needed for wheezing or shortness of breath. 8 g 2   allopurinol (ZYLOPRIM) 100 MG tablet TAKE TWO TABLETS BY MOUTH ONCE DAILY 180 tablet 1   aspirin 81 MG EC tablet Take 1 tablet (81 mg total) by mouth daily. Swallow whole. 30 tablet 12   atorvastatin (LIPITOR) 80 MG tablet TAKE ONE TABLET BY MOUTH EVERYDAY AT BEDTIME 90 tablet 3   colchicine 0.6 MG tablet Take 0.6 mg by mouth daily as needed (gout).     diazepam (VALIUM) 10 MG tablet TAKE ONE TABLET BY MOUTH EVERY TWELVE HOURS AS NEEDED FOR ANXIETY 60 tablet 0   dicyclomine (BENTYL) 10 MG capsule Take 1 capsule (10 mg total) by mouth in the morning, at noon, in the evening, and at bedtime. 360 capsule 1   escitalopram (LEXAPRO) 20 MG tablet TAKE ONE TABLET BY MOUTH EVERY MORNING 90 tablet 1   ezetimibe (ZETIA) 10 MG tablet TAKE ONE TABLET BY MOUTH ONCE DAILY 90 tablet 3   famotidine (PEPCID) 20 MG tablet TAKE ONE TABLET BY MOUTH AT  BREAKFAST AND AT BEDTIME Pt needs appt for further refills 60 tablet 0   isosorbide mononitrate (IMDUR) 60 MG 24 hr tablet Take 1 tablet (60 mg total) by mouth daily. 90 tablet 3   levothyroxine (SYNTHROID) 25 MCG tablet TAKE ONE TABLET BY MOUTH EVERY MORNING 90 tablet 1   lisinopril (ZESTRIL) 2.5 MG tablet TAKE ONE TABLET BY MOUTH EVERYDAY AT BEDTIME 90 tablet 3   meclizine (ANTIVERT) 25 MG tablet Take 1 tablet by mouth twice daily as needed for dizziness. 30 tablet 0   metoprolol succinate (TOPROL-XL) 25 MG 24 hr tablet Take 0.5 tablets by mouth daily.     nitroGLYCERIN (NITROSTAT) 0.4 MG SL tablet Place 1 tablet (0.4 mg total) under the tongue every 5 (five) minutes as needed for chest pain. 45 tablet 3   nystatin (MYCOSTATIN/NYSTOP) powder Apply powder TO THE affected area(s) twice daily 60 g 4   pantoprazole (PROTONIX) 40 MG tablet TAKE ONE TABLET BY MOUTH TWICE DAILY 180 tablet 0   potassium chloride SA (KLOR-CON M) 20 MEQ tablet TAKE ONE TABLET BY MOUTH AT NOON 30 tablet 0   Respiratory Therapy Supplies (NEBULIZER/TUBING/MOUTHPIECE) KIT Use as directed 1 kit 0   sucralfate (CARAFATE) 1 g tablet TAKE ONE TABLET BY MOUTH FOUR TIMES DAILY. MAY cut pill in half AND DISSOLVE in water prior TO drinking 120 tablet 0   No facility-administered medications prior to visit.    Allergies  Allergen Reactions   Ibuprofen Other (See Comments)    Pt has hx of peptic ulcer and diverticulitis.     Review of Systems  Gastrointestinal:  Positive for diarrhea.       (+)bowel incontinence  Musculoskeletal:        (+)left hip pain (-)right hip pain (+)right shoulder pain       Objective:    Physical Exam Constitutional:      General: She is not in acute distress.  Appearance: Normal appearance. She is not ill-appearing.  HENT:     Head: Normocephalic and atraumatic.     Right Ear: External ear normal.     Left Ear: External ear normal.  Eyes:     Extraocular Movements: Extraocular  movements intact.     Pupils: Pupils are equal, round, and reactive to light.  Cardiovascular:     Rate and Rhythm: Normal rate and regular rhythm.     Pulses:          Dorsalis pedis pulses are 2+ on the right side and 2+ on the left side.       Posterior tibial pulses are 2+ on the right side and 2+ on the left side.     Heart sounds: Normal heart sounds. No murmur heard.    No gallop.  Pulmonary:     Effort: Pulmonary effort is normal. No respiratory distress.     Breath sounds: Normal breath sounds. No wheezing or rales.  Skin:    General: Skin is warm and dry.  Neurological:     Mental Status: She is alert and oriented to person, place, and time.  Psychiatric:        Judgment: Judgment normal.     BP 125/62 (BP Location: Right Arm, Patient Position: Sitting, Cuff Size: Large)   Pulse 82   Temp 98 F (36.7 C) (Oral)   Resp 16   Ht  (1.626 m)   Wt 203 lb (92.1 kg)   SpO2 97%   BMI 34.84 kg/m  Wt Readings from Last 3 Encounters:  07/26/22 203 lb (92.1 kg)  03/19/22 190 lb (86.2 kg)  02/11/22 192 lb (87.1 kg)       Assessment & Plan:  Left sided sciatica -     methylPREDNISolone; Take 6 tablets (60 mg total) by mouth on day 1, then 5 tablets (50 mg total) on day 2, then 4 tablets (40 mg total) on day 3, then 3 tablets (30 mg total) on day 4, then 2 tablets (20 mg total) on day 5, and then 1 tablet (10 mg total) on day 6.  Dispense: 21 tablet; Refill: 0  Controlled type 2 diabetes mellitus with complication, without long-term current use of insulin Assessment & Plan: Lab Results  Component Value Date   HGBA1C 6.7 (H) 02/05/2022   HGBA1C 6.5 01/09/2021   HGBA1C 6.2 (H) 09/17/2020   Lab Results  Component Value Date   MICROALBUR <0.7 03/05/2021   LDLCALC 57 08/26/2021   CREATININE 1.60 (H) 03/19/2022     Orders: -     Ambulatory referral to Ophthalmology -     AMB Referral to Chronic Care Management Services -     Microalbumin / creatinine urine ratio;  Future  Chronic obstructive pulmonary disease, unspecified COPD type Assessment & Plan: Clinically stable. Has albuterol MDI/nebulizer as needed.    Gastroesophageal reflux disease, unspecified whether esophagitis present Assessment & Plan: Stable on pantoprazole and pepcid.    Gout of hand, unspecified cause, unspecified chronicity, unspecified laterality Assessment & Plan: Stable on allopurinol. Continue same.    Primary hypertension Assessment & Plan: BP at goal, continue lisinopril and metoprolol.    Hypothyroidism, unspecified type Assessment & Plan: Lab Results  Component Value Date   TSH 3.19 02/05/2022   Clinically stable on synthroid. Obtain follow up TSH.   Orders: -     TSH  Mixed hyperlipidemia Assessment & Plan: Lab Results  Component Value Date   CHOL 127 08/26/2021  HDL 43.10 08/26/2021   LDLCALC 57 08/26/2021   TRIG 135.0 08/26/2021   CHOLHDL 3 08/26/2021   LDL at goal.  Continue atorvastatin.   Orders: -     Lipid panel  Anxiety -     Drug Tox Monitor 1 w/Conf, Oral Fld  Encounter for long-term (current) use of high-risk medication -     Drug Tox Monitor 1 w/Conf, Oral Fld  Other orders -     Accu-Chek Guide Me; Use to test blood sugar in the morning, at noon, and at bedtime.  Dispense: 1 kit; Refill: 0 -     Blood Glucose Test; Use to test blood sugar in the morning, at noon, and at bedtime. May substitute to any manufacturer covered by patient's insurance.  Dispense: 100 strip; Refill: 0 -     Accu-Chek FastClix Lancet; 1 each by Does not apply route in the morning, at noon, and at bedtime.  Dispense: 1 kit; Refill: 0 -     Accu-Chek Softclix Lancets; 1 each in the morning, at noon, and at bedtime.  Dispense: 100 each; Refill: 0    I, Lemont Fillers, NP, personally preformed the services described in this documentation.  All medical record entries made by the scribe were at my direction and in my presence.  I have reviewed the  chart and discharge instructions (if applicable) and agree that the record reflects my personal performance and is accurate and complete. 07/26/2022   I,Veronica Wolf,acting as a Neurosurgeon for Lemont Fillers, NP.,have documented all relevant documentation on the behalf of Lemont Fillers, NP,as directed by  Lemont Fillers, NP while in the presence of Lemont Fillers, NP.   Lemont Fillers, NP

## 2022-07-26 NOTE — Assessment & Plan Note (Signed)
BP at goal, continue lisinopril and metoprolol.

## 2022-07-26 NOTE — Assessment & Plan Note (Signed)
Stable on pantoprazole and pepcid

## 2022-07-26 NOTE — Assessment & Plan Note (Signed)
Lab Results  Component Value Date   TSH 3.19 02/05/2022   Clinically stable on synthroid. Obtain follow up TSH.

## 2022-07-26 NOTE — Assessment & Plan Note (Signed)
Lab Results  Component Value Date   CHOL 127 08/26/2021   HDL 43.10 08/26/2021   LDLCALC 57 08/26/2021   TRIG 135.0 08/26/2021   CHOLHDL 3 08/26/2021   LDL at goal.  Continue atorvastatin.

## 2022-07-26 NOTE — Assessment & Plan Note (Signed)
Stable on allopurinol. Continue same. 

## 2022-07-27 ENCOUNTER — Encounter: Payer: Self-pay | Admitting: Family

## 2022-07-27 ENCOUNTER — Other Ambulatory Visit (HOSPITAL_BASED_OUTPATIENT_CLINIC_OR_DEPARTMENT_OTHER): Payer: Self-pay

## 2022-07-27 NOTE — Progress Notes (Unsigned)
Mailed out to pt 

## 2022-07-28 ENCOUNTER — Other Ambulatory Visit (INDEPENDENT_AMBULATORY_CARE_PROVIDER_SITE_OTHER): Payer: Medicare PPO

## 2022-07-28 DIAGNOSIS — E118 Type 2 diabetes mellitus with unspecified complications: Secondary | ICD-10-CM

## 2022-07-28 LAB — MICROALBUMIN / CREATININE URINE RATIO
Creatinine,U: 179.2 mg/dL
Microalb Creat Ratio: 0.8 mg/g (ref 0.0–30.0)
Microalb, Ur: 1.4 mg/dL (ref 0.0–1.9)

## 2022-07-28 NOTE — Progress Notes (Signed)
No charge today. Pt unable to urinate at previous OV and is returning sample today.

## 2022-07-29 ENCOUNTER — Other Ambulatory Visit: Payer: Self-pay | Admitting: Family

## 2022-07-29 NOTE — Telephone Encounter (Signed)
Requesting: diazepam   Contract: 03/14/20 UDS: 07/26/22 Last Visit: 07/26/22 Next Visit: None Last Refill: 06/25/22 #60 and 0RF  Please Advise

## 2022-07-30 LAB — DRUG TOX MONITOR 1 W/CONF, ORAL FLD
Alprazolam: NEGATIVE ng/mL (ref <0.50)
Amphetamines: NEGATIVE ng/mL (ref <10)
Barbiturates: NEGATIVE ng/mL (ref <10)
Benzodiazepines: POSITIVE ng/mL — AB (ref <0.50)
Buprenorphine: NEGATIVE ng/mL (ref <0.10)
Chlordiazepoxide: NEGATIVE ng/mL (ref <0.50)
Clonazepam: NEGATIVE ng/mL (ref <0.50)
Cocaine: NEGATIVE ng/mL (ref <5.0)
Diazepam: 3.56 ng/mL — ABNORMAL HIGH (ref <0.50)
Fentanyl: NEGATIVE ng/mL (ref <0.10)
Flunitrazepam: NEGATIVE ng/mL (ref <0.50)
Flurazepam: NEGATIVE ng/mL (ref <0.50)
Heroin Metabolite: NEGATIVE ng/mL (ref <1.0)
Lorazepam: NEGATIVE ng/mL (ref <0.50)
MARIJUANA: NEGATIVE ng/mL (ref <2.5)
MDMA: NEGATIVE ng/mL (ref <10)
Meprobamate: NEGATIVE ng/mL (ref <2.5)
Methadone: NEGATIVE ng/mL (ref <5.0)
Midazolam: NEGATIVE ng/mL (ref <0.50)
Nicotine Metabolite: NEGATIVE ng/mL (ref <5.0)
Nordiazepam: 9.44 ng/mL — ABNORMAL HIGH (ref <0.50)
Opiates: NEGATIVE ng/mL (ref <2.5)
Oxazepam: NEGATIVE ng/mL (ref <0.50)
Phencyclidine: NEGATIVE ng/mL (ref <10)
Tapentadol: NEGATIVE ng/mL (ref <5.0)
Temazepam: NEGATIVE ng/mL (ref <0.50)
Tramadol: NEGATIVE ng/mL (ref <5.0)
Triazolam: NEGATIVE ng/mL (ref <0.50)
Zolpidem: NEGATIVE ng/mL (ref <5.0)

## 2022-08-02 ENCOUNTER — Telehealth: Payer: Self-pay

## 2022-08-02 ENCOUNTER — Telehealth: Payer: Self-pay | Admitting: Family

## 2022-08-02 NOTE — Telephone Encounter (Signed)
Patient would like a call back to go over lab work. Please advise.

## 2022-08-02 NOTE — Telephone Encounter (Signed)
   Telephone encounter was:  Unsuccessful.  08/02/2022 Name: Veronica Wolf MRN: [id number] DOB: 1941/03/15  Unsuccessful outbound call made today to assist with:  Transportation Needs   Outreach Attempt:  1st Attempt  A HIPAA compliant voice message was left requesting a return call.  Instructed patient to call back .    Lenard Forth [facility] Guide, [facility] [phone] [address] [place], [place], [place] [address] Phone: [phone] Email: [email or web address]

## 2022-08-02 NOTE — Progress Notes (Signed)
  Chronic Care Management   Note  08/02/2022 Name: Veronica Wolf MRN: [id number] DOB: 1941-02-18  Veronica Wolf is a 82 y.o. year old female who is a primary care patient of [name], NP. I reached out to Veronica Wolf by phone today in response to a referral sent by Veronica Wolf's PCP.  Veronica Wolf was given information about Chronic Care Management services today including:  CCM service includes personalized support from designated clinical staff supervised by the physician, including individualized plan of care and coordination with other care providers 24/7 contact phone numbers for assistance for urgent and routine care needs. Service will only be billed when office clinical staff spend 20 minutes or more in a month to coordinate care. Only one practitioner may furnish and bill the service in a calendar month. The patient may stop CCM services at amy time (effective at the end of the month) by phone call to the office staff. The patient will be responsible for cost sharing (co-pay) or up to 20% of the service fee (after annual deductible is met)  Veronica Wolf  agreedto scheduling an appointment with the CCM RN Case Manager   Follow up plan: Patient agreed to scheduled appointment with RN Case Manager on 08/06/2022(date/time).   Veronica Wolf, Veronica Wolf [facility]  [place], [place] [address] Direct Dial: [phone] [email or web address]

## 2022-08-03 ENCOUNTER — Telehealth: Payer: Self-pay | Admitting: Family

## 2022-08-03 ENCOUNTER — Telehealth: Payer: Self-pay

## 2022-08-03 NOTE — Telephone Encounter (Signed)
Pt received letter with lab results but she said she doesn't understand all the numbers and needs a call to advise.

## 2022-08-03 NOTE — Telephone Encounter (Signed)
Spoke to patient and she seems to be very confused, was taking medrol dose pack but forgot day 3 and 4 she was going to take this dosages together. She was advised to go back and do only one day dose at a time and continue to check glucose levels.  She will call back if any other questions.

## 2022-08-03 NOTE — Telephone Encounter (Signed)
   Telephone encounter was:  Unsuccessful.  08/03/2022 Name: Veronica Wolf MRN: [id number] DOB: 13-Dec-1940  Unsuccessful outbound call made today to assist with:  Transportation Needs   Outreach Attempt:  2nd Attempt  A HIPAA compliant voice message was left requesting a return call.  Instructed patient to call back     Lenard Forth [facility] Guide, [facility] [phone] [address] [place], [place], [place] [address] Phone: [phone] Email: [email or web address]

## 2022-08-03 NOTE — Telephone Encounter (Signed)
Called patient a few times but no answer 

## 2022-08-05 ENCOUNTER — Telehealth: Payer: Self-pay

## 2022-08-05 ENCOUNTER — Ambulatory Visit: Payer: Medicare PPO

## 2022-08-05 NOTE — Telephone Encounter (Signed)
   Telephone encounter was:  Unsuccessful.  08/05/2022 Name: Veronica Wolf MRN: [id number] DOB: 1940/08/17  Unsuccessful outbound call made today to assist with:  Transportation Needs   Outreach Attempt:  3rd Attempt.  Referral closed unable to contact patient.  A HIPAA compliant voice message was left requesting a return call.  Instructed patient to call back .   Lenard Forth [facility] Guide, [facility] [phone] [address] [place], [place], [place] [address] Phone: [phone] Email: [email or web address]

## 2022-08-06 ENCOUNTER — Telehealth: Payer: Medicare PPO

## 2022-08-06 ENCOUNTER — Telehealth: Payer: Self-pay

## 2022-08-06 NOTE — Telephone Encounter (Signed)
   CCM RN Visit Note   08/06/22 Name: [name] MRN: [id number]      DOB: 1941/03/10  Subjective: Veronica Wolf is a 82 y.o. year old female who is a primary care patient of [name] NP. The patient was referred to the Chronic Care Management team for assistance with care management needs subsequent to provider initiation of CCM services and plan of care.      An unsuccessful outreach attempt was made today for a scheduled CCM visit.   PLAN: Unable to leave a voice message d/t voice mailbox being full. The care team will make additional outreach attempts.   Katha Cabal RN Care Manager/Chronic Care Management [phone]

## 2022-08-11 ENCOUNTER — Telehealth: Payer: Self-pay | Admitting: Family

## 2022-08-11 NOTE — Telephone Encounter (Signed)
Patient called stating she has had bad stomach pain for x2days and would like to know what can she take. Please call and advise.

## 2022-08-13 NOTE — Telephone Encounter (Signed)
Patient reports she is feeling better, "had some stomach pain and took pepto bismol"

## 2022-08-18 ENCOUNTER — Ambulatory Visit (INDEPENDENT_AMBULATORY_CARE_PROVIDER_SITE_OTHER): Payer: Medicare PPO | Admitting: Pharmacist

## 2022-08-18 ENCOUNTER — Telehealth: Payer: Self-pay | Admitting: Pharmacist

## 2022-08-18 DIAGNOSIS — I10 Essential (primary) hypertension: Secondary | ICD-10-CM

## 2022-08-18 DIAGNOSIS — Z79899 Other long term (current) drug therapy: Secondary | ICD-10-CM

## 2022-08-18 DIAGNOSIS — K219 Gastro-esophageal reflux disease without esophagitis: Secondary | ICD-10-CM

## 2022-08-18 NOTE — Progress Notes (Signed)
Pharmacy Note  08/18/2022 Name: Veronica Wolf MRN: [id number] DOB: [birth year]  Subjective: Veronica Wolf is a 82 y.o. year old female who is a primary care patient of [name], NP. Clinical Pharmacist Practitioner referral was placed to assist with medication management.    Engaged with patient by telephone for follow up visit today.  Patient thought she had an appointment scheduled for yesterday 05/14 but I don't seen anything that was scheduled.  It looks like Care Guide has been trying to reach her regarding transportation for medical visits and Care Management nurse has also been trying to reach her.   Hypertension:  Current treatment: lisinopril 2.5mg  daily and metoprolol ER 25mg  - take 0.5 tablet daily   Not checking blood pressure at home.  She does report having headaches more frequently.  Almost every day.   Abdominal Pain:  Patient mentions today about having stomach pain one morning (same incident mentioned in phone call 08/11/22).  She took a dose of Pepto Bismol and then abdominal pain resolved. She wants to know if it happens again if it would be OK to take Pepto Bismol again.   Medication Management:  Patient continues to receive medications from her pharmacy in adherence packaging to help her to take the right medications daily. She endorses that she has been taking as directed per packaging directions.   She also reports she is taking meclizine 1 tablet every day.     SDOH (Social Determinants of Health) assessments and interventions performed:  SDOH Interventions    Flowsheet Row Clinical Support from 06/09/2022 in [facility] Primary Care at [facility] Chronic Care Management from 10/07/2021 in [facility] Primary Care at [facility] Chronic Care Management from 05/13/2021 in [facility] Primary Care at [employer] [facility] [facility] Telephone from 05/08/2021 in [facility] Primary Care at [facility] Chronic Care  Management from 03/25/2021 in [facility] Primary Care at [facility] Telephone from 03/09/2021 in [facility] Community Care Coordination  SDOH Interventions        Food Insecurity Interventions Intervention Not Indicated -- Intervention Not Indicated, Other (Comment)  [Working with [employer] to [employer]/Nutrition Resources] [employer] Referral  [employer] on Wheels] -- --  Housing Interventions Intervention Not Indicated Intervention Not Indicated Intervention Not Indicated -- -- --  Transportation Interventions Intervention Not Indicated -- Intervention Not Indicated, Other (Comment)  [Working with [employer] to [employer] and Resources] -- -- Other (Comment)  [Patient outreached and going to mail her information on [place] transportation and also on [place] transport[place]ion]  Utilities Interventions Intervention Not Indicated -- -- -- -- --  Alcohol Usage Interventions Intervention Not Indicated (Score <7) -- -- -- -- --  Depression Interventions/Treatment  -- -- Referral to Psychiatry, Medication, Counseling -- -- --  Financial Strain Interventions -- Intervention Not Indicated Intervention Not Indicated -- -- --  Physical Activity Interventions -- -- Patient Refused -- Patient Refused --  Stress Interventions -- -- [employer], Provide Counseling, Other (Comment)  Research officer, trade union for Counseling Services] -- -- --  Social Connections Interventions -- -- Patient Refused -- -- --        Objective: Review of patient status, including review of consultants reports, laboratory and other test data, was performed as part of comprehensive evaluation and provision of chronic care management services.   Lab Results  Component Value Date   CREATININE 1.60 (H) 03/19/2022  CREATININE 1.62 (H) 01/21/2022   CREATININE 1.45 (H) 11/06/2021    Lab Results  Component Value Date   HGBA1C 6.7 (H) 02/05/2022        Component Value Date/Time   CHOL 134 07/26/2022 0948   TRIG 148.0 07/26/2022 0948   HDL 43.00 07/26/2022 0948   CHOLHDL 3 07/26/2022 0948   VLDL 29.6 07/26/2022 0948   LDLCALC 62 07/26/2022 0948   LDLCALC 108 (H) 03/14/2020 1439     Clinical ASCVD: Yes  The ASCVD Risk score (Arnett DK, et al., 2019) failed to calculate for the following reasons:   The 2019 ASCVD risk score is only valid for ages 83 to 69   The patient has a prior MI or stroke diagnosis    BP Readings from Last 3 Encounters:  07/26/22 125/62  03/19/22 (!) 145/95  02/11/22 126/70     Allergies  Allergen Reactions   Ibuprofen Other (See Comments)    Pt has hx of peptic ulcer and diverticulitis.     Medications Reviewed Today     Reviewed by [name], RPH-CPP (Pharmacist) on 08/18/22 at 1129  Med List Status: <None>   Medication Order Taking? Sig Documenting Provider Last Dose Status Informant  Accu-Chek Softclix Lancets lancets [id number] Yes 1 each in the morning, at noon, and at bedtime. [name], NP Taking Active   albuterol (PROVENTIL) (2.5 MG/3ML) 0.083% nebulizer solution [id number] No USE 1 VIAL IN NEBULIZER EVERY 6 HOURS AS NEEDED FOR WHEEZING AND FOR SHORTNESS OF BREATH  Patient not taking: Reported on 08/18/2022   [name], NP Not Taking Active   albuterol (VENTOLIN HFA) 108 (90 Base) MCG/ACT inhaler [id number] Yes Inhale 2 puffs into the lungs every 6 (six) hours as needed for wheezing or shortness of breath. [name], NP Taking Active   allopurinol (ZYLOPRIM) 100 MG tablet [id number] Yes TAKE TWO TABLETS BY MOUTH ONCE DAILY [name], NP Taking Active   aspirin 81 MG EC tablet [id number] Yes Take 1 tablet (81 mg total) by mouth daily. Swallow whole. Thomasene Ripple, DO Taking Active            Med Note Clydie Braun, Madeleyn Schwimmer B   Wed Oct 07, 2021 11:19 AM)    atorvastatin (LIPITOR) 80 MG tablet [id number] Yes TAKE ONE TABLET BY MOUTH EVERYDAY AT BEDTIME Tobb, Kardie,  DO Taking Active   Blood Glucose Monitoring Suppl (ACCU-CHEK GUIDE ME) w/Device KIT [id number] Yes Use to test blood sugar in the morning, at noon, and at bedtime. [name], NP Taking Active   colchicine 0.6 MG tablet [id number] No Take 0.6 mg by mouth daily as needed (gout).  Patient not taking: Reported on 08/18/2022   [provider] Not Taking Active   diazepam (VALIUM) 10 MG tablet [id number] Yes TAKE ONE TABLET BY MOUTH every 12 hours AS NEEDED FOR ANXIETY [name], NP Taking Active   dicyclomine (BENTYL) 10 MG capsule [id number] No Take 1 capsule (10 mg total) by mouth in the morning, at noon, in the evening, and at bedtime.  Patient not taking: Reported on 08/18/2022   [name], NP Not Taking Active   escitalopram (LEXAPRO) 20 MG tablet [id number] Yes TAKE ONE TABLET BY MOUTH EVERY MORNING [name], NP Taking Active   ezetimibe (ZETIA) 10 MG tablet [id number] Yes TAKE ONE TABLET BY MOUTH ONCE DAILY Tobb, Kardie, DO Taking Active   famotidine (PEPCID) 20 MG tablet [id number] Yes TAKE ONE TABLET BY MOUTH AT BREAKFAST AND AT BEDTIME Pt needs appt  for further refills [name], NP Taking Active   Glucose Blood (BLOOD GLUCOSE TEST STRIPS) STRP [id number] Yes Use to test blood sugar in the morning, at noon, and at bedtime. May substitute to any manufacturer covered by patient's insurance. [name], NP Taking Active   isosorbide mononitrate (IMDUR) 60 MG 24 hr tablet [id number] Yes Take 1 tablet (60 mg total) by mouth daily. Tobb, Kardie, DO Taking Active   Lancets Misc. (ACCU-CHEK FASTCLIX LANCET) KIT [id number] Yes 1 each by Does not apply route in the morning, at noon, and at bedtime. [name], NP Taking Active   levothyroxine (SYNTHROID) 25 MCG tablet [id number] Yes TAKE ONE TABLET BY MOUTH EVERY MORNING [name], NP Taking Active   lisinopril (ZESTRIL) 2.5 MG tablet [id number] Yes TAKE ONE TABLET BY MOUTH EVERYDAY  AT BEDTIME Tobb, Kardie, DO Taking Active   meclizine (ANTIVERT) 25 MG tablet [id number] Yes Take 1 tablet by mouth twice daily as needed for dizziness.  Patient taking differently: Take 25 mg by mouth daily.   [name], NP Taking Active   Patient not taking:  Discontinued 08/18/22 1127 (Completed Course)   metoprolol succinate (TOPROL-XL) 25 MG 24 hr tablet [id number] Yes Take 0.5 tablets by mouth daily. [provider] Taking Active   nitroGLYCERIN (NITROSTAT) 0.4 MG SL tablet [id number] Yes Place 1 tablet (0.4 mg total) under the tongue every 5 (five) minutes as needed for chest pain. Thomasene Ripple, DO Taking Active   nystatin (MYCOSTATIN/NYSTOP) powder [id number] Yes Apply powder TO THE affected area(s) twice daily [name], NP Taking Active   pantoprazole (PROTONIX) 40 MG tablet [id number] Yes TAKE ONE TABLET BY MOUTH TWICE DAILY [name], NP Taking Active   potassium chloride SA (KLOR-CON M) 20 MEQ tablet [id number] Yes TAKE ONE TABLET BY MOUTH AT [place] Officer, Efraim Kaufmann, NP Taking Active   Respiratory Therapy Supplies (NEBULIZER/TUBING/MOUTHPIECE) KIT [id number] Yes Use as directed [name], NP Taking Active   sucralfate (CARAFATE) 1 g tablet [id number] Yes TAKE ONE TABLET BY MOUTH FOUR TIMES DAILY. MAY cut pill in half AND DISSOLVE in water prior TO drinking [name], NP Taking Active             Patient Active Problem List   Diagnosis Date Noted   Elevated prolactin level 02/10/2022   Galactorrhea 02/05/2022   Controlled type 2 diabetes mellitus with complication, without long-term current use of insulin (HCC) 02/05/2022   Pulmonary hypertension, unspecified (HCC) 09/17/2021   Morbid obesity (HCC) 09/17/2021   Acute vaginitis 09/14/2021   Atypical chest pain 08/26/2021   Memory loss 08/26/2021   Cough 04/21/2021   Back pain 12/03/2020   Grief reaction 12/03/2020   Fatty liver 12/01/2020   Intermittent left-sided chest  pain 09/17/2020   Coronary artery disease involving native coronary artery of native heart 09/17/2020   Metabolic syndrome 09/17/2020   Allergy 09/16/2020   Anxiety 09/16/2020   Hypertension 09/16/2020   Vertigo 09/16/2020   Dark stools 09/08/2020   Dysphagia    Esophageal stricture    Hiatal hernia    Epigastric pain    Hypothyroid 01/25/2018   Mild CAD 10/03/2017   Stress-induced cardiomyopathy 09/19/2017   COPD (chronic obstructive pulmonary disease) (HCC) 09/14/2017   Hypokalemia 09/14/2017   Non-ST elevation (NSTEMI) myocardial infarction [facility]) - due to Demand Infarction 09/14/2017   CKD (chronic kidney disease) 04/04/2017   HLD (hyperlipidemia)    Allergic rhinitis    Gout 02/28/2017   Depression with anxiety 02/28/2017   COPD with  chronic bronchitis 12/22/2016   GERD (gastroesophageal reflux disease) 12/22/2016   DOE (dyspnea on exertion) 12/22/2016   Peptic ulcer 01/04/2003     Medication Assistance:  None required.  Patient affirms current coverage meets needs.   Assessment / Plan: Medication Management: Continue to use adherence packaging.  Reminded that she should only take meclizine 25mg  IF NEEDED for Dizziness.   Hypertension:  Recommended patient start taking blood pressure at home 2 or 3 times per day.  Continue current medication for blood pressure control  Headaches:  Looks like patient has MRI of brain ordered but has not been completed.  Will let her PCP know about headaches for recommendations  Abdominal pain - improved after dose of Pepto Bismol Will check with PCP if OK to take Pepto Bismol as long as not taking more than 2 times per week.   SODH:  Provided patient with phone numbers for [facility] Health Care Guide and RN with Care Management.  Lenard Forth [facility] Guide, [phone]   Gae Dry, RN - [phone]   Follow Up:  Telephone follow up appointment with care management team member scheduled for:  3 months -  patient can call sooner if any medication questions.   [name], PharmD Clinical Pharmacist [facility] Primary Care  - [facility] [phone]

## 2022-08-18 NOTE — Telephone Encounter (Signed)
I was able to reach patient on home number. See Phone visit note.

## 2022-08-23 ENCOUNTER — Other Ambulatory Visit: Payer: Self-pay | Admitting: Family

## 2022-08-23 DIAGNOSIS — R42 Dizziness and giddiness: Secondary | ICD-10-CM

## 2022-08-23 NOTE — Telephone Encounter (Signed)
Pt would like to find out what medication she was given for her stomach pain before. She said she is having the same pain and would like a refill of that medication sent to the downstairs pharmacy. Please call pt.

## 2022-08-23 NOTE — Telephone Encounter (Signed)
Called patient but no answer, left voice mail for patient to call back.   

## 2022-08-24 NOTE — Telephone Encounter (Signed)
Spoke to patient's daughter and she reports patient is doing well and not needing anything at this time

## 2022-08-31 ENCOUNTER — Telehealth: Payer: Self-pay

## 2022-08-31 ENCOUNTER — Other Ambulatory Visit: Payer: Self-pay | Admitting: Family

## 2022-08-31 NOTE — Telephone Encounter (Signed)
Called patient but no answer, left voice mail for patient to call back.   

## 2022-08-31 NOTE — Telephone Encounter (Signed)
Initial Comment Caller has been having severe stomach pains and it's affecting her sleeping. Translation No Nurse Assessment Nurse: Wiser, Charity fundraiser, Heidi Date/Time (Eastern Time): 08/27/2022 6:04:06 PM Confirm and document reason for call. If symptomatic, describe symptoms. ---Caller has severe abdominal pain. Pain is intermittent and above naval. Caller also has a cough and nasal congestion. Wheezing present. Does the patient have any new or worsening symptoms? ---Yes Will a triage be completed? ---Yes Related visit to physician within the last 2 weeks? ---No Does the PT have any chronic conditions? (i.e. diabetes, asthma, this includes High risk factors for pregnancy, etc.) ---No Is this a behavioral health or substance abuse call? ---No Guidelines Guideline Title Affirmed Question Affirmed Notes Nurse Date/Time (Eastern Time) Cough - Acute NonProductive Patient sounds very sick or weak to the triager Delrae Rend, RN, [facility] 08/27/2022 6:07:04 PM Disp. Time Lamount Cohen Time) Disposition Final User 08/27/2022 6:02:59 PM Send to Urgent Queue Rowe Clack 08/27/2022 6:10:59 PM Go to ED Now Yes Wiser, RN, Heidi Final Disposition 08/27/2022 6:10:59 PM Go to ED Now Yes Wiser, RN, Heidi PLEASE NOTE: All timestamps contained within this report are represented as [place] Standard Time. CONFIDENTIALTY NOTICE: This fax transmission is intended only for the addressee. It contains information that is legally privileged, confidential or otherwise protected from use or disclosure. If you are not the intended recipient, you are strictly prohibited from reviewing, disclosing, copying using or disseminating any of this information or taking any action in reliance on or regarding this information. If you have received this fax in error, please notify us immediately by telephone so that we can arrange for its return to [place]. Phone: [phone], Toll-Free: [phone], Fax: [phone] Page: 2 of 2 Call Id:  [id number] Disposition Overriden: Go to ED Now (or PCP triage) Override Reason: Patient's symptoms need a higher level of care Caller Disagree/Comply Comply Caller Understands Yes PreDisposition InappropriateToAsk Care Advice Given Per Guideline GO TO ED NOW: * You need to be seen in the Emergency Department. ANOTHER ADULT SHOULD DRIVE: * It is better and safer if another adult drives instead of you. CARE ADVICE given per Cough - Acute Non-Productive (Adult) guideline. Referrals [facility] - ED

## 2022-09-02 ENCOUNTER — Other Ambulatory Visit: Payer: Self-pay | Admitting: Family

## 2022-09-02 NOTE — Telephone Encounter (Signed)
Called patient but no answer, left voice mail for patient to call back.   

## 2022-09-02 NOTE — Telephone Encounter (Signed)
Patient scheduled to come in tomorrow  

## 2022-09-03 ENCOUNTER — Ambulatory Visit: Payer: Medicare PPO | Admitting: Family

## 2022-09-06 ENCOUNTER — Telehealth: Payer: Self-pay | Admitting: Family

## 2022-09-06 NOTE — Telephone Encounter (Signed)
Patient's daughter advised patient was given a rx  for sucralfate 09/03/22 she can try this but if she has not improvement she needs ov. She verbalized understanding.

## 2022-09-06 NOTE — Telephone Encounter (Signed)
Patient called and states has an stomach pain, offered and appt but does not want to come in. She states she was seen before and was prescribed an rx for stomach pain and would like the same. Please call [phone]

## 2022-09-08 ENCOUNTER — Ambulatory Visit (INDEPENDENT_AMBULATORY_CARE_PROVIDER_SITE_OTHER): Payer: Medicare PPO | Admitting: Family

## 2022-09-08 VITALS — BP 118/78 | HR 93 | Temp 98.0°F | Resp 16 | Ht 64.0 in | Wt 198.0 lb

## 2022-09-08 DIAGNOSIS — R1013 Epigastric pain: Secondary | ICD-10-CM | POA: Diagnosis not present

## 2022-09-08 DIAGNOSIS — K5900 Constipation, unspecified: Secondary | ICD-10-CM | POA: Insufficient documentation

## 2022-09-08 DIAGNOSIS — R059 Cough, unspecified: Secondary | ICD-10-CM | POA: Diagnosis not present

## 2022-09-08 DIAGNOSIS — R195 Other fecal abnormalities: Secondary | ICD-10-CM

## 2022-09-08 LAB — CBC WITH DIFFERENTIAL/PLATELET
Basophils Absolute: 0.1 10*3/uL (ref 0.0–0.1)
Basophils Relative: 0.9 % (ref 0.0–3.0)
Eosinophils Absolute: 0.2 10*3/uL (ref 0.0–0.7)
Eosinophils Relative: 2.5 % (ref 0.0–5.0)
HCT: 38.8 % (ref 36.0–46.0)
Hemoglobin: 12.2 g/dL (ref 12.0–15.0)
Lymphocytes Relative: 30.9 % (ref 12.0–46.0)
Lymphs Abs: 3 10*3/uL (ref 0.7–4.0)
MCHC: 31.5 g/dL (ref 30.0–36.0)
MCV: 86 fl (ref 78.0–100.0)
Monocytes Absolute: 0.6 10*3/uL (ref 0.1–1.0)
Monocytes Relative: 6.3 % (ref 3.0–12.0)
Neutro Abs: 5.7 10*3/uL (ref 1.4–7.7)
Neutrophils Relative %: 59.4 % (ref 43.0–77.0)
Platelets: 218 10*3/uL (ref 150.0–400.0)
RBC: 4.51 Mil/uL (ref 3.87–5.11)
RDW: 16.8 % — ABNORMAL HIGH (ref 11.5–15.5)
WBC: 9.6 10*3/uL (ref 4.0–10.5)

## 2022-09-08 NOTE — Patient Instructions (Signed)
  During your visit, we discussed your recurrent morning epigastric pain, changes in bowel movements, persistent cough, and intermittent right arm pain. We also discussed your general health maintenance.  YOUR PLAN:  -EPIGASTRIC PAIN: Your stomach pain, which is worse in the morning, may be related to your known hiatal hernia and history of esophageal stricture (narrowing of the esophagus). We will refer you to a gastroenterologist for a possible repeat endoscopy, a procedure to look inside your esophagus. We will also order blood work and provide a stool kit to check for hidden blood in your stools.  -CONSTIPATION: You reported less frequent and smaller bowel movements. We recommend increasing your intake of fruits and vegetables and using Metamucil daily to help with this.  -CHRONIC COUGH: Your persistent cough may be a side effect of Lisinopril, a medication you are currently taking. We will discontinue this medication for a few weeks to see if your cough improves.  -RIGHT ARM PAIN: You mentioned intermittent pain in your right arm, which has currently resolved. We will monitor this and ask you to report if the pain becomes persistent or worsens.  -GENERAL HEALTH MAINTENANCE: We will attempt to collect a sterile urine sample for analysis. If we are unable to do this in the office, we will provide you with a cup to collect a sample at home.  INSTRUCTIONS:  Please follow the dietary recommendations for constipation and discontinue Lisinopril as advised. Make sure to collect your stool and urine samples as instructed. Keep an eye on your right arm pain and report any changes. We will be scheduling a follow-up appointment with a gastroenterologist for you.

## 2022-09-08 NOTE — Assessment & Plan Note (Signed)
Recurrent epigastric pain in the setting of a known hiatal hernia and a history of esophageal stricture. Pain is worse in the morning and associated with headaches. Currently on Carafate, Pepcid, and Protonix. -Refer to gastroenterology for possible repeat endoscopy given history of esophageal stricture. -Order blood work to assess for any abnormalities. -Provide stool kit to check for occult blood given report of dark stools.

## 2022-09-08 NOTE — Assessment & Plan Note (Signed)
Obtain IFOB.

## 2022-09-08 NOTE — Assessment & Plan Note (Signed)
Reports of a persistent cough. Currently on Lisinopril which can cause cough as a side effect. -Discontinue Lisinopril for a few weeks to assess if cough improves.

## 2022-09-08 NOTE — Progress Notes (Signed)
Subjective:     Patient ID: Veronica Wolf, female    DOB: 04-Oct-1940, 82 y.o.   MRN: [id number]  Chief Complaint  Patient presents with   Abdominal Pain    Complains of abdominal pain    HPI  Discussed the use of AI scribe software for clinical note transcription with the patient, who gave verbal consent to proceed.  History of Present Illness   The patient, with a history of hiatal hernia and esophageal stricture, presents with epigastric pain that woke her up  the morning. The pain was associated with a headache, similar to a previous episode. The pain is more prominent in the morning and seems to be relieved as the day progresses. She also reports a cough and feeling 'sort of sick' in the morning. She has noticed a change in her bowel movements, with less frequent and smaller stools that are dark in color. She denies any blood in the stool. She also mentions a transient pain in her arm that resolved on its own.     The patient, with a history of hiatal hernia and esophageal stricture, presents with epigastric pain that woke her up  the morning. The pain was associated with a headache, similar to a previous episode. The pain is more prominent in the morning and seems to be relieved as the day progresses. She also reports a cough and feeling 'sort of sick' in the morning. She has noticed a change in her bowel movements, with less frequent and smaller stools that are dark in color. She denies any blood in the stool. She also mentions a transient pain in her arm that resolved on its own.           Health Maintenance Due  Topic Date Due   OPHTHALMOLOGY EXAM  Never done   Zoster Vaccines- Shingrix (1 of 2) Never done   COVID-19 Vaccine (3 - Pfizer risk series) 07/06/2019   HEMOGLOBIN A1C  08/06/2022    Past Medical History:  Diagnosis Date   Allergic rhinitis    Allergy    seasonal allergies   Anxiety    CKD (chronic kidney disease) 04/04/2017   COPD (chronic obstructive  pulmonary disease) (HCC)    uses inhaler   Depression    Fatty liver    GERD (gastroesophageal reflux disease)    Gout    hx of   Hyperlipidemia    on meds   Hypertension    on meds   Myocardial infarction [facility])    Peptic ulcer 01/04/2003   Vertigo     Past Surgical History:  Procedure Laterality Date   ABDOMINAL HYSTERECTOMY     APPENDECTOMY     BIOPSY  08/13/2020   Procedure: BIOPSY;  Surgeon: Shellia Cleverly, DO;  Location: [facility];  Service: Gastroenterology;;   CORONARY PRESSURE/FFR STUDY N/A 01/27/2022   Procedure: INTRAVASCULAR PRESSURE WIRE/FFR STUDY;  Surgeon: Orbie Pyo, MD;  Location: [facility] INVASIVE CV LAB;  Service: Cardiovascular;  Laterality: N/A;   ESOPHAGOGASTRODUODENOSCOPY (EGD) WITH PROPOFOL N/A 08/13/2020   Procedure: ESOPHAGOGASTRODUODENOSCOPY (EGD) WITH PROPOFOL;  Surgeon: Shellia Cleverly, DO;  Location: [facility];  Service: Gastroenterology;  Laterality: N/A;   LEFT HEART CATH AND CORONARY ANGIOGRAPHY N/A 06/13/2017   Procedure: LEFT HEART CATH AND CORONARY ANGIOGRAPHY;  Surgeon: Marykay Lex, MD;  Location: [facility] INVASIVE CV LAB;  Service: Cardiovascular;  Laterality: N/A;   LEFT HEART CATH AND CORONARY ANGIOGRAPHY N/A 01/27/2022   Procedure: LEFT HEART CATH AND CORONARY  ANGIOGRAPHY;  Surgeon: Orbie Pyo, MD;  Location[facility] INVASIVE CV LAB;  Service: Cardiovascular;  Laterality: N/A;   RIGHT/LEFT HEART CATH AND CORONARY ANGIOGRAPHY N/A 09/16/2017   Procedure: RIGHT/LEFT HEART CATH AND CORONARY ANGIOGRAPHY;  Surgeon: Swaziland, Peter M, MD;  Location: [facility] INVASIVE CV LAB;  Service: Cardiovascular;  Laterality: N/A;   SAVORY DILATION N/A 08/13/2020   Procedure: SAVORY DILATION;  Surgeon: Shellia Cleverly, DO;  Location: [facility];  Service: Gastroenterology;  Laterality: N/A;   ULTRASOUND GUIDANCE FOR VASCULAR ACCESS  09/16/2017   Procedure: Ultrasound Guidance For Vascular Access;  Surgeon: Swaziland, Peter M, MD;  Location: [facility] INVASIVE CV LAB;   Service: Cardiovascular;;   WISDOM TOOTH EXTRACTION      Family History  Problem Relation Age of Onset   Breast cancer Mother    Stroke Father    Stomach cancer Neg Hx    Colon cancer Neg Hx    Pancreatic cancer Neg Hx    Esophageal cancer Neg Hx    Colon polyps Neg Hx    Rectal cancer Neg Hx     Social History   Socioeconomic History   Marital status: Widowed    Spouse name: Not on file   Number of children: 1   Years of education: 62   Highest education level: 12th grade  Occupational History   Occupation: Retired  Tobacco Use   Smoking status: Former    Types: Cigarettes    Quit date: 04/24/2000    Years since quitting: 22.3    Passive exposure: Past   Smokeless tobacco: Never  Vaping Use   Vaping Use: Never used  Substance and Sexual Activity   Alcohol use: Not Currently    Comment: has previous hx of ETOH abuse, quit 2014   Drug use: No   Sexual activity: Not Currently  Other Topics Concern   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[place]   Grew up in [place]   Has daughter locally   Social Determinants of Health   Financial Resource Strain: Low Risk  (10/07/2021)   Overall Financial Resource Strain (CARDIA)    Difficulty of Paying Living Expenses: Not very hard  Food Insecurity: Food Insecurity Present (06/09/2022)   Hunger Vital Sign    Worried About Running Out of Food in the Last Year: Sometimes true    Ran Out of Food in the Last Year: Never true  Transportation Needs: Unmet Transportation Needs (06/09/2022)   PRAPARE - Administrator, Civil Service (Medical): Yes    Lack of Transportation (Non-Medical): Yes  Physical Activity: Inactive (05/13/2021)   Exercise Vital Sign    Days of Exercise per Week: 0 days    Minutes of Exercise per Session: 0 min  Stress: Stress Concern Present (05/13/2021)   [employer] of Occupational Health - Occupational Stress Questionnaire    Feeling of Stress : Rather  much  Social Connections: Moderately Isolated (05/13/2021)   Social Connection and Isolation Panel [NHANES]    Frequency of Communication with Friends and Family: More than three times a week    Frequency of Social Gatherings with Friends and Family: More than three times a week    Attends Religious Services: 1 to 4 times per year    Active Member of [employer] or Organizations: No    Attends Banker Meetings: Never    Marital Status: Widowed  Intimate Partner Violence: Not At Risk (06/09/2022)  Humiliation, Afraid, Rape, and Kick questionnaire    Fear of Current or Ex-Partner: No    Emotionally Abused: No    Physically Abused: No    Sexually Abused: No    Outpatient Medications Prior to Visit  Medication Sig Dispense Refill   albuterol (PROVENTIL) (2.5 MG/3ML) 0.083% nebulizer solution USE 1 VIAL IN NEBULIZER EVERY 6 HOURS AS NEEDED FOR WHEEZING AND FOR SHORTNESS OF BREATH 150 mL 0   albuterol (VENTOLIN HFA) 108 (90 Base) MCG/ACT inhaler Inhale 2 puffs into the lungs every 6 (six) hours as needed for wheezing or shortness of breath. 8 g 2   allopurinol (ZYLOPRIM) 100 MG tablet TAKE TWO TABLETS BY MOUTH ONCE DAILY 180 tablet 1   aspirin 81 MG EC tablet Take 1 tablet (81 mg total) by mouth daily. Swallow whole. 30 tablet 12   atorvastatin (LIPITOR) 80 MG tablet TAKE ONE TABLET BY MOUTH EVERYDAY AT BEDTIME 90 tablet 3   Blood Glucose Monitoring Suppl (ACCU-CHEK GUIDE ME) w/Device KIT Use to test blood sugar in the morning, at noon, and at bedtime. 1 kit 0   colchicine 0.6 MG tablet Take 0.6 mg by mouth daily as needed (gout).     diazepam (VALIUM) 10 MG tablet TAKE ONE TABLET BY MOUTH EVERY TWELVE HOURS AS NEEDED FOR ANXIETY 60 tablet 0   dicyclomine (BENTYL) 10 MG capsule Take 1 capsule (10 mg total) by mouth in the morning, at noon, in the evening, and at bedtime. 360 capsule 1   escitalopram (LEXAPRO) 20 MG tablet TAKE ONE TABLET BY MOUTH EVERY MORNING 90 tablet 1   ezetimibe  (ZETIA) 10 MG tablet TAKE ONE TABLET BY MOUTH ONCE DAILY 90 tablet 3   famotidine (PEPCID) 20 MG tablet TAKE ONE TABLET BY MOUTH EVERY MORNING WITH BREAKFAST and TAKE ONE TABLET BY MOUTH EVERYDAY AT BEDTIME 60 tablet 0   isosorbide mononitrate (IMDUR) 60 MG 24 hr tablet Take 1 tablet (60 mg total) by mouth at bedtime. 90 tablet 0   levothyroxine (SYNTHROID) 25 MCG tablet TAKE ONE TABLET BY MOUTH EVERY MORNING 90 tablet 1   lisinopril (ZESTRIL) 2.5 MG tablet TAKE ONE TABLET BY MOUTH EVERYDAY AT BEDTIME 90 tablet 3   meclizine (ANTIVERT) 25 MG tablet TAKE ONE TABLET BY MOUTH twice daily AS NEEDED dizziness 30 tablet 0   metoprolol succinate (TOPROL-XL) 25 MG 24 hr tablet Take 0.5 tablets by mouth daily.     nitroGLYCERIN (NITROSTAT) 0.4 MG SL tablet Place 1 tablet (0.4 mg total) under the tongue every 5 (five) minutes as needed for chest pain. 45 tablet 3   nystatin (MYCOSTATIN/NYSTOP) powder Apply powder TO THE affected area(s) twice daily 60 g 4   pantoprazole (PROTONIX) 40 MG tablet TAKE ONE TABLET BY MOUTH TWICE DAILY 180 tablet 0   potassium chloride SA (KLOR-CON M) 20 MEQ tablet TAKE ONE TABLET BY MOUTH AT NOON 30 tablet 0   Respiratory Therapy Supplies (NEBULIZER/TUBING/MOUTHPIECE) KIT Use as directed 1 kit 0   sucralfate (CARAFATE) 1 g tablet TAKE 1/2 TABLET BY MOUTH FOUR TIMES DAILY may cut pill in half and dis in water prior to drinking 120 tablet 0   No facility-administered medications prior to visit.    Allergies  Allergen Reactions   Ibuprofen Other (See Comments)    Pt has hx of peptic ulcer and diverticulitis.     ROS See HPI    Objective:    Physical Exam Constitutional:      General: She is not in  acute distress.    Appearance: Normal appearance. She is well-developed.  HENT:     Head: Normocephalic and atraumatic.     Right Ear: External ear normal.     Left Ear: External ear normal.  Eyes:     General: No scleral icterus. Neck:     Thyroid: No thyromegaly.   Cardiovascular:     Rate and Rhythm: Normal rate and regular rhythm.     Heart sounds: Normal heart sounds. No murmur heard. Pulmonary:     Effort: Pulmonary effort is normal. No respiratory distress.     Breath sounds: Normal breath sounds. No wheezing.  Abdominal:     General: Bowel sounds are normal.     Palpations: Abdomen is soft.     Tenderness: There is abdominal tenderness in the epigastric area. There is no guarding or rebound.  Musculoskeletal:     Cervical back: Neck supple.  Skin:    General: Skin is warm and dry.  Neurological:     Mental Status: She is alert and oriented to person, place, and time.  Psychiatric:        Mood and Affect: Mood normal.        Behavior: Behavior normal.        Thought Content: Thought content normal.        Judgment: Judgment normal.      BP 118/78 (BP Location: Right Arm, Patient Position: Sitting, Cuff Size: Large)   Pulse 93   Temp 98 F (36.7 C) (Oral)   Resp 16   Ht 5\' 4"  (1.626 m)   Wt 198 lb (89.8 kg)   SpO2 98%   BMI 33.99 kg/m  Wt Readings from Last 3 Encounters:  09/08/22 198 lb (89.8 kg)  07/26/22 203 lb (92.1 kg)  03/19/22 190 lb (86.2 kg)       Assessment & Plan:   Problem List Items Addressed This Visit       Unprioritized   Epigastric pain    Recurrent epigastric pain in the setting of a known hiatal hernia and a history of esophageal stricture. Pain is worse in the morning and associated with headaches. Currently on Carafate, Pepcid, and Protonix. -Refer to gastroenterology for possible repeat endoscopy given history of esophageal stricture. -Order blood work to assess for any abnormalities. -Provide stool kit to check for occult blood given report of dark stools.      Relevant Orders   Ambulatory referral to Gastroenterology   CBC w/Diff (Completed)   Dark stools - Primary    Obtain IFOB.      Relevant Orders   Fecal occult blood, imunochemical([facility])   CBC w/Diff (Completed)    Cough    Reports of a persistent cough. Currently on Lisinopril which can cause cough as a side effect. -Discontinue Lisinopril for a few weeks to assess if cough improves.      Constipation    Infrequent bowel movements with small, dark stools. -Advise increase in fruits and vegetables and daily use of Metamucil.        I am having Deborra P. Sulser "Elease Hashimoto" maintain her colchicine, aspirin EC, nitroGLYCERIN, albuterol, dicyclomine, albuterol, Nebulizer/Tubing/Mouthpiece, ezetimibe, atorvastatin, lisinopril, metoprolol succinate, allopurinol, levothyroxine, escitalopram, nystatin, pantoprazole, Accu-Chek Guide Me, isosorbide mononitrate, meclizine, famotidine, potassium chloride SA, diazepam, and sucralfate.  No orders of the defined types were placed in this encounter.

## 2022-09-08 NOTE — Assessment & Plan Note (Signed)
Infrequent bowel movements with small, dark stools. -Advise increase in fruits and vegetables and daily use of Metamucil.

## 2022-09-09 ENCOUNTER — Telehealth: Payer: Self-pay | Admitting: Family

## 2022-09-09 NOTE — Telephone Encounter (Signed)
Initial Comment Caller states she was prescribed a medication and she now has diarrhea. Translation No Nurse Assessment Nurse: Wyn Quaker, RN, Marylene Land Date/Time (Eastern Time): 09/09/2022 8:06:59 AM Confirm and document reason for call. If symptomatic, describe symptoms. ---Caller stated that she was given Sucralafate yesterday. She has had diarrhea since 2 am. She stated that she doesn't know what the stomach issue is that she had. She had stomach pain and had gone to be seen. She is drinking liquids and she has urinated in the past 8 hours. Does the patient have any new or worsening symptoms? ---Yes Will a triage be completed? ---Yes Related visit to physician within the last 2 weeks? ---Yes Does the PT have any chronic conditions? (i.e. diabetes, asthma, this includes High risk factors for pregnancy, etc.) ---Yes List chronic conditions. ---vertigo/dizziness, mood, dicyclomine Is this a behavioral health or substance abuse call? ---No Guidelines Guideline Title Affirmed Question Affirmed Notes Nurse Date/Time (Eastern Time) Diarrhea MILD-MODERATE diarrhea (e.g., 1-6 times / day more than normal) Dew, RN, Marylene Land 09/09/2022 8:21:15 AM PLEASE NOTE: All timestamps contained within this report are represented as [place] Standard Time. CONFIDENTIALTY NOTICE: This fax transmission is intended only for the addressee. It contains information that is legally privileged, confidential or otherwise protected from use or disclosure. If you are not the intended recipient, you are strictly prohibited from reviewing, disclosing, copying using or disseminating any of this information or taking any action in reliance on or regarding this information. If you have received this fax in error, please notify us immediately by telephone so that we can arrange for its return to [place]. Phone: [phone], Toll-Free: [phone], Fax: [phone] Page: 2 of 2 Call Id: [id number] Disp. Time Lamount Cohen Time) Disposition  Final User 09/09/2022 8:44:13 AM Home Care Yes Wyn Quaker, RN, Marylene Land Final Disposition 09/09/2022 8:44:13 AM Home Care Yes Dew, RN, Rosalyn Charters Disagree/Comply Comply Caller Understands Yes PreDisposition Call Doctor Care Advice Given Per Guideline HOME CARE: * You should be able to treat this at home. REASSURANCE AND EDUCATION - DIARRHEA: * Diarrhea may be caused by a virus ('stomach flu') or a bacteria. Diarrhea is one of the body's way of getting rid of germs. * Staying well-hydrated is the most important thing if you have diarrhea. From what you have told me, it sounds like you are not severely dehydrated at this point. * Do not use for more than 2 days. DIARRHEA MEDICINE - B[place] SUBSALICYLATE (E.G., PEPTO-BISMOL): WASH YOUR HANDS: * Wash your hands after using the bathroom. * Wash your hands before fixing or eating food. * Wash soiled towels, sheets, or clothes separately. * Do not share towels or sheets. * Do not swim for 2 weeks after diarrhea is gone. CALL BACK IF: * Signs of dehydration occur (e.g., no urine over 12 hours, very dry mouth, lightheaded, etc.) * Diarrhea lasts over 7 days * You become worse CARE ADVICE given per Diarrhea (Adult) guideline. Comments User: Gifford Shave, RN Date/Time Lamount Cohen Time): 09/09/2022 8:20:27 AM Caller stated that her speech pattern is related to her being nervous. She had diarrhea x1 yesterday before her app't. User: Gifford Shave, RN Date/Time Lamount Cohen Time): 09/09/2022 8:24:03 AM Denies abd pain at this time; stated that she had abd pain just before diarrhea but it went away

## 2022-09-09 NOTE — Telephone Encounter (Signed)
Pt stated after taking the medication pcp gave her yesterday for irregular bowel movements, she has had diarrhea nonstop. She would like to know if she should continue taking rx. She is hoping someone can give her a call today.

## 2022-09-13 ENCOUNTER — Telehealth: Payer: Self-pay | Admitting: Family

## 2022-09-13 ENCOUNTER — Encounter: Payer: Self-pay | Admitting: Family

## 2022-09-13 ENCOUNTER — Telehealth: Payer: Self-pay

## 2022-09-13 ENCOUNTER — Telehealth: Payer: Self-pay | Admitting: Gastroenterology

## 2022-09-13 NOTE — Telephone Encounter (Signed)
Patient called states she is having really bad epigastric pain seeking advise.

## 2022-09-13 NOTE — Telephone Encounter (Signed)
Pt called stating that she is still having issues with her chest pain and the medication that Melissa had prescribed wasn't helping. Pt expressed that she would like to speak to someone as soon as possible regarding this to see what she needs to do.

## 2022-09-13 NOTE — Telephone Encounter (Signed)
Initial Comment Caller states she has questions about her medication. Additional Comment Remake of [id number] Translation No Disp. Time Veronica Wolf Time) Disposition Final User 09/12/2022 5:14:25 PM Send To RN Personal Veronica Wolf, Veronica Wolf 09/12/2022 5:28:16 PM Clinical Call Yes Veronica Chandler, RN, Veronica Wolf Final Disposition 09/12/2022 5:28:16 PM Clinical Call Yes Veronica Chandler, RN, Veronica Wolf Comments User: Harrold Donath, RN Date/Time Veronica Wolf Time): 09/12/2022 5:28:03 PM spoke with patient and she called but then waiting for our call back she noticed that she had misread her rx and no longer needs our services

## 2022-09-13 NOTE — Telephone Encounter (Signed)
Patient returning call. Please advise

## 2022-09-13 NOTE — Telephone Encounter (Signed)
Spoke to patient's daughter and advised to continue the sulcrafate and call GI to get appointment.

## 2022-09-13 NOTE — Telephone Encounter (Signed)
Patient called again, now she reports "strong chest pain with the abdominal pain"  Advised she if she now has chest pain as described she needs to call 911 or go to the emergency department as soon as possible. She verbalized understanding.

## 2022-09-13 NOTE — Telephone Encounter (Signed)
Lm on vm for patient to return call 

## 2022-09-13 NOTE — Telephone Encounter (Signed)
Called patient but no answer, left voice mail for patient to call back.   

## 2022-09-14 ENCOUNTER — Ambulatory Visit: Payer: Medicare PPO | Admitting: Nurse Practitioner

## 2022-09-14 ENCOUNTER — Telehealth: Payer: Self-pay

## 2022-09-14 NOTE — Telephone Encounter (Signed)
Returned call to patient. Pt states that her issue is getting transportation for an appt, pt will keep September appt as scheduled to allow time to get transportation for the appt. I told pt that I will mail her appt information to her, she confirmed address on file. Pt verbalized understanding and had no concerns at the end of the call.

## 2022-09-14 NOTE — Telephone Encounter (Signed)
Patient is calling to follow up states she needs an appt asap. Please advise

## 2022-09-14 NOTE — Telephone Encounter (Signed)
Caller Name Nykiah Ma Caller Phone Number [phone] Patient Name Veronica Wolf Patient DOB 12-15-1940 Call Type Message Only Information Provided Reason for Call Returning a Call from the Office Initial Comment Caller says that she just had a call that she was expecting but she missed it. They were trying to make an appt. for her Disp. Time Disposition Final User 09/13/2022 5:12:57 PM General Information Provided Yes Einar Pheasant Call Closed By: Einar Pheasant Transaction Date/Time: 09/13/2022 5:09:23 PM (ET)

## 2022-09-14 NOTE — Telephone Encounter (Signed)
Spoke with patient. Pt will come in today at 11 am to see Willette Cluster, NP. I provided pt with the office address and contact information.

## 2022-09-14 NOTE — Telephone Encounter (Signed)
Have talked to patient and her daughter Veronica Wolf a few times since yesterday. Patient keeps complaining of chest pain and has been advised twice to go to ed.

## 2022-09-14 NOTE — Telephone Encounter (Signed)
Spoke to patient's daughter earlier today and she was not aware her mother was calling us for chest pain. She reports she thinks is the same abdominal pain, I told her we advised her to go get seen at ed.  Daughter reports patient will not be able to go see GI today due to transportation issues.   Patient called again this afternoon and is still complaining of chest pain. I strongly advised her again to call 911 or have someone take her to the nearest emergency department. She verbalized understanding.

## 2022-09-14 NOTE — Telephone Encounter (Signed)
Inbound call from patient stating she is unable to come in today at 46. States she does not have transportation. Please advise, thank you.

## 2022-09-17 ENCOUNTER — Other Ambulatory Visit: Payer: Self-pay | Admitting: Family

## 2022-09-17 DIAGNOSIS — R42 Dizziness and giddiness: Secondary | ICD-10-CM

## 2022-09-17 NOTE — Telephone Encounter (Signed)
error 

## 2022-09-21 ENCOUNTER — Other Ambulatory Visit: Payer: Self-pay | Admitting: Family

## 2022-09-23 ENCOUNTER — Telehealth: Payer: Self-pay | Admitting: Family

## 2022-09-23 NOTE — Telephone Encounter (Signed)
Pt has not been able to get in contact with [facility] to get sucralfate and she is not sure what to. Confirmed phone number and she would like to know if there is an alternate phone number or another pharmacy that will deliver rx. Please advise.

## 2022-09-27 NOTE — Telephone Encounter (Signed)
Patient notified, per [facility] representative, medication will be sent to her today

## 2022-10-07 ENCOUNTER — Other Ambulatory Visit: Payer: Self-pay | Admitting: Cardiology

## 2022-10-22 ENCOUNTER — Other Ambulatory Visit (HOSPITAL_COMMUNITY)
Admission: RE | Admit: 2022-10-22 | Discharge: 2022-10-22 | Disposition: A | Discharge status: Home / Self Care | Payer: Medicare PPO | Source: Ambulatory Visit | Attending: Family Medicine | Admitting: Family Medicine

## 2022-10-22 ENCOUNTER — Encounter: Payer: Self-pay | Admitting: Family Medicine

## 2022-10-22 ENCOUNTER — Ambulatory Visit: Payer: Medicare PPO | Admitting: Family Medicine

## 2022-10-22 ENCOUNTER — Other Ambulatory Visit (HOSPITAL_BASED_OUTPATIENT_CLINIC_OR_DEPARTMENT_OTHER): Payer: Self-pay

## 2022-10-22 ENCOUNTER — Ambulatory Visit (INDEPENDENT_AMBULATORY_CARE_PROVIDER_SITE_OTHER): Payer: Medicare PPO | Admitting: Family Medicine

## 2022-10-22 ENCOUNTER — Other Ambulatory Visit: Payer: Self-pay

## 2022-10-22 VITALS — BP 134/68 | HR 69 | Temp 97.7°F | Ht 64.0 in | Wt 196.2 lb

## 2022-10-22 DIAGNOSIS — R102 Pelvic and perineal pain: Secondary | ICD-10-CM | POA: Insufficient documentation

## 2022-10-22 DIAGNOSIS — R300 Dysuria: Secondary | ICD-10-CM | POA: Diagnosis not present

## 2022-10-22 MED ORDER — ACYCLOVIR 800 MG PO TABS
800.0000 mg | ORAL_TABLET | Freq: Three times a day (TID) | ORAL | 0 refills | Status: AC
Start: 2022-10-22 — End: 2022-10-24
  Filled 2022-10-22 – 2022-12-30 (×2): qty 6, 2d supply, fill #0

## 2022-10-22 MED ORDER — CLOBETASOL PROPIONATE 0.05 % EX OINT
1.0000 | TOPICAL_OINTMENT | Freq: Two times a day (BID) | CUTANEOUS | 0 refills | Status: DC
Start: 2022-10-22 — End: 2022-11-17
  Filled 2022-10-22: qty 30, 15d supply, fill #0

## 2022-10-22 NOTE — Patient Instructions (Addendum)
Sending off vaginal swabs to see what we may be dealing with. Recommend you follow-up with gynecology if not improving or testing is inconclusive.  Lesions are not typical HSV appearance but given history of HSV2 seropositive, can try antiviral for relief. If ineffective, can try to low-dose clobetasol topically for itching/inflammation. GYN follow-up recommended.

## 2022-10-22 NOTE — Addendum Note (Signed)
Addended by: Mervin Kung A on: 10/22/2022 02:19 PM   Modules accepted: Orders

## 2022-10-22 NOTE — Progress Notes (Signed)
Acute Office Visit  Subjective:     Patient ID: Veronica Wolf, female    DOB: 1940-06-13, 82 y.o.   MRN: [id number]  CC: vaginal pain and dysuria   HPI Patient is in today for urinary symptoms.   Discussed the use of AI scribe software for clinical note transcription with the patient, who gave verbal consent to proceed.  History of Present Illness   The patient presents with a chief complaint of persistent and worsening discomfort in the urogenital region, specifically the urethra and vagina, over the past few days. The discomfort is described as a constant soreness, intensifying during urination. The patient also reports frequent urination, with an incident of incontinence when attempting to delay urination due to the associated pain.  In addition to the urinary symptoms, the patient reports a sensation of small 'bubbles' or sores in the vaginal area, which were palpated but not visually confirmed. This is a new symptom for the patient, with no prior history of similar occurrences. The patient denies any associated fever or changes in urine color or odor. However, she reports an increase in baseline back pain.            ROS All review of systems negative except what is listed in the HPI      Objective:    BP 134/68   Pulse 69   Temp 97.7 F (36.5 C)   Ht 5\' 4"  (1.626 m)   Wt 196 lb 3.2 oz (89 kg)   SpO2 97%   BMI 33.68 kg/m    Physical Exam Vitals reviewed. Exam conducted with a chaperone present.  Constitutional:      Appearance: Normal appearance.  Cardiovascular:     Rate and Rhythm: Normal rate and regular rhythm.  Pulmonary:     Effort: Pulmonary effort is normal.     Breath sounds: Normal breath sounds.  Abdominal:     Tenderness: There is no abdominal tenderness. There is no right CVA tenderness, left CVA tenderness or guarding.  Genitourinary:    Exam position: Lithotomy position.     Vagina: Erythema and tenderness present.     Comments: Labia  minora with significant erythema and inflammation; no typical HSV lesions; could not tolerate speculum exam Neurological:     Mental Status: She is alert.  Psychiatric:        Mood and Affect: Mood normal.        Behavior: Behavior normal.        Thought Content: Thought content normal.        Judgment: Judgment normal.     No results found for any visits on 10/22/22.      Assessment & Plan:   Problem List Items Addressed This Visit   None Visit Diagnoses     Dysuria    -  Primary   Relevant Orders   Urinalysis with Culture Reflex   Vaginal pain       Relevant Medications   acyclovir (ZOVIRAX) 800 MG tablet   clobetasol ointment (TEMOVATE) 0.05 %   Other Relevant Orders   Herpes Simplex Virus Culture w/ Rflx to Typing   Cervicovaginal ancillary only      Unable to leave urine sample in office - patient to bring back. Sending off vaginal swabs to see what we may be dealing with. Recommend you follow-up with gynecology if not improving or testing is inconclusive.  Lesions are not typical HSV appearance but given history of HSV2 seropositive, can try antiviral for  relief. If ineffective, can try to low-dose clobetasol topically for itching/inflammation. GYN follow-up recommended.      Meds ordered this encounter  Medications   acyclovir (ZOVIRAX) 800 MG tablet    Sig: Take 1 tablet (800 mg total) by mouth 3 (three) times daily for 2 days.    Dispense:  6 tablet    Refill:  0    Order Specific Question:   Supervising Provider    Answer:   Danise Edge A [[id number]]   clobetasol ointment (TEMOVATE) 0.05 %    Sig: Apply 1 Application topically 2 (two) times daily. For up to 2 weeks.    Dispense:  30 g    Refill:  0    Order Specific Question:   Supervising Provider    Answer:   Danise Edge A [[id number]]    Return if symptoms worsen or fail to improve.  Clayborne Dana, NP

## 2022-10-23 LAB — URINALYSIS W MICROSCOPIC + REFLEX CULTURE
Glucose, UA: NEGATIVE
Nitrites, Initial: NEGATIVE
WBC, UA: >=60 /HPF — AB (ref 0–5)
pH: 5.5 (ref 5.0–8.0)

## 2022-10-23 LAB — CULTURE INDICATED

## 2022-10-25 ENCOUNTER — Other Ambulatory Visit: Payer: Self-pay | Admitting: Family

## 2022-10-25 ENCOUNTER — Other Ambulatory Visit: Payer: Self-pay | Admitting: Cardiology

## 2022-10-25 DIAGNOSIS — R42 Dizziness and giddiness: Secondary | ICD-10-CM

## 2022-10-25 LAB — URINALYSIS W MICROSCOPIC + REFLEX CULTURE: Bilirubin Urine: NEGATIVE

## 2022-10-25 LAB — CERVICOVAGINAL ANCILLARY ONLY
Bacterial Vaginitis (gardnerella): NEGATIVE
Candida Glabrata: NEGATIVE
Candida Vaginitis: NEGATIVE
Chlamydia: NEGATIVE
Comment: NEGATIVE
Comment: NEGATIVE
Comment: NEGATIVE
Comment: NEGATIVE
Comment: NEGATIVE
Comment: NORMAL
Neisseria Gonorrhea: NEGATIVE
Trichomonas: NEGATIVE

## 2022-10-25 LAB — URINE CULTURE

## 2022-10-26 ENCOUNTER — Other Ambulatory Visit (HOSPITAL_BASED_OUTPATIENT_CLINIC_OR_DEPARTMENT_OTHER): Payer: Self-pay

## 2022-10-26 ENCOUNTER — Other Ambulatory Visit: Payer: Self-pay | Admitting: Family

## 2022-10-26 ENCOUNTER — Telehealth: Payer: Self-pay | Admitting: Family Medicine

## 2022-10-26 ENCOUNTER — Other Ambulatory Visit: Payer: Self-pay

## 2022-10-26 DIAGNOSIS — R300 Dysuria: Secondary | ICD-10-CM

## 2022-10-26 LAB — URINE CULTURE
MICRO NUMBER:: 15225050
SPECIMEN QUALITY:: ADEQUATE

## 2022-10-26 LAB — HERPES SIMPLEX VIRUS CULTURE W/RFLX TO TYPING
MICRO NUMBER:: 15222621
SPECIMEN QUALITY:: ADEQUATE

## 2022-10-26 LAB — URINALYSIS W MICROSCOPIC + REFLEX CULTURE: Specific Gravity, Urine: 1.025 (ref 1.001–1.035)

## 2022-10-26 MED ORDER — CEFDINIR 300 MG PO CAPS
300.0000 mg | ORAL_CAPSULE | Freq: Two times a day (BID) | ORAL | 0 refills | Status: AC
Start: 2022-10-26 — End: 2022-10-31

## 2022-10-26 MED ORDER — CEFDINIR 300 MG PO CAPS
300.0000 mg | ORAL_CAPSULE | Freq: Two times a day (BID) | ORAL | 0 refills | Status: DC
Start: 2022-10-26 — End: 2022-10-26
  Filled 2022-10-26: qty 10, 5d supply, fill #0

## 2022-10-26 NOTE — Addendum Note (Signed)
Addended by: Hyman Hopes B on: 10/26/2022 01:58 PM   Modules accepted: Orders

## 2022-10-26 NOTE — Telephone Encounter (Signed)
I sent the Rx to [facility] since the pt said she could get the Rx tomorrow. I tried to set her up for her f/u but she had hung up before front desk transferred her back. So she will still need to be scheduled. Rx might have to be cx at [facility] in the AM.

## 2022-10-26 NOTE — Telephone Encounter (Signed)
Looks like patient did bring back urine. Please advise.

## 2022-10-26 NOTE — Telephone Encounter (Signed)
Sent in Cefdinir for UTI (result note in now)

## 2022-10-26 NOTE — Telephone Encounter (Signed)
Pt has transportation issues and is wondering if it can be sent to [facility] so she can get it tomorrow. Also needs to go over labs. Please let her know as she is in pain.

## 2022-10-26 NOTE — Telephone Encounter (Signed)
Pt is wondering if Ladona Ridgel could send in medication for her vaginal pain.

## 2022-10-27 NOTE — Telephone Encounter (Signed)
Appt made with daughter

## 2022-10-29 ENCOUNTER — Telehealth: Payer: Self-pay | Admitting: Family

## 2022-10-29 NOTE — Telephone Encounter (Signed)
Pt called stating that the new medication cefdinir (OMNICEF) 300 MG capsule [[id number]] she was prescribed is causing her to have diarrhea.  Please advise pt on if she should still take medication.

## 2022-11-01 ENCOUNTER — Encounter: Payer: Self-pay | Admitting: Physician Assistant

## 2022-11-01 ENCOUNTER — Ambulatory Visit: Payer: Medicare PPO | Admitting: Physician Assistant

## 2022-11-01 VITALS — BP 130/74 | HR 80 | Temp 98.0°F | Resp 20 | Ht 64.0 in | Wt 202.0 lb

## 2022-11-01 DIAGNOSIS — K529 Noninfective gastroenteritis and colitis, unspecified: Secondary | ICD-10-CM | POA: Diagnosis not present

## 2022-11-01 NOTE — Telephone Encounter (Signed)
Patient here to be seen 

## 2022-11-01 NOTE — Progress Notes (Signed)
Established patient visit   Patient: Veronica Wolf   DOB: Jul 30, 1940   82 y.o. Female  MRN: [id number] Visit Date: 11/01/2022  Today's healthcare provider: Alfredia Ferguson, PA-C   Chief Complaint  Patient presents with   Diarrhea    Since she started the Cefdinir 10/27/2022 disp date Patient states that diarrhea has stopped.   Urinary Frequency    Patient unable to provide urine sample at this time but given water    Subjective    Diarrhea  Pertinent negatives include no abdominal pain, coughing, fever or headaches.  Urinary Frequency  Associated symptoms include frequency.     Pt presents today with her daughter. Concerns over chronic diarrhea, worsened over the last week or so. Reports large volume and runny.  Reports epigastric abdominal pain x 1 day.  Denies continued dysuria. Pt did complete cefidinir from 7/19 visit for dysuria-- culture returned Klebsiella, sensitive to abx.   Medications: Outpatient Medications Prior to Visit  Medication Sig   albuterol (PROVENTIL) (2.5 MG/3ML) 0.083% nebulizer solution USE 1 VIAL IN NEBULIZER EVERY 6 HOURS AS NEEDED FOR WHEEZING AND FOR SHORTNESS OF BREATH   albuterol (VENTOLIN HFA) 108 (90 Base) MCG/ACT inhaler Inhale 2 puffs into the lungs every 6 (six) hours as needed for wheezing or shortness of breath.   allopurinol (ZYLOPRIM) 100 MG tablet Take 2 tablets (200 mg total) by mouth daily.   aspirin 81 MG EC tablet Take 1 tablet (81 mg total) by mouth daily. Swallow whole.   atorvastatin (LIPITOR) 80 MG tablet TAKE ONE TABLET BY MOUTH EVERYDAY AT BEDTIME   Blood Glucose Monitoring Suppl (ACCU-CHEK GUIDE ME) w/Device KIT Use to test blood sugar in the morning, at noon, and at bedtime.   clobetasol ointment (TEMOVATE) 0.05 % Apply 1 Application topically 2 (two) times daily. For up to 2 weeks.   colchicine 0.6 MG tablet Take 0.6 mg by mouth daily as needed (gout).   diazepam (VALIUM) 10 MG tablet TAKE ONE TABLET BY MOUTH every 12  hours AS NEEDED FOR ANXIETY   dicyclomine (BENTYL) 10 MG capsule Take 1 capsule (10 mg total) by mouth in the morning, at noon, in the evening, and at bedtime.   escitalopram (LEXAPRO) 20 MG tablet Take 1 tablet (20 mg total) by mouth every morning.   ezetimibe (ZETIA) 10 MG tablet TAKE ONE TABLET BY MOUTH EVERY MORNING   famotidine (PEPCID) 20 MG tablet Take 1 tablet (20 mg total) by mouth 2 (two) times daily.   isosorbide mononitrate (IMDUR) 60 MG 24 hr tablet Take 1 tablet (60 mg total) by mouth at bedtime.   levothyroxine (SYNTHROID) 25 MCG tablet TAKE ONE TABLET BY MOUTH EVERY MORNING   lisinopril (ZESTRIL) 2.5 MG tablet TAKE ONE TABLET BY MOUTH EVERYDAY AT BEDTIME   meclizine (ANTIVERT) 25 MG tablet TAKE ONE TABLET BY MOUTH daily AS NEEDED FOR dizziness   metoprolol succinate (TOPROL-XL) 25 MG 24 hr tablet TAKE 1/2 TABLET BY MOUTH EVERY EVENING   nitroGLYCERIN (NITROSTAT) 0.4 MG SL tablet Place 1 tablet (0.4 mg total) under the tongue every 5 (five) minutes as needed for chest pain.   nystatin (MYCOSTATIN/NYSTOP) powder Apply powder TO THE affected area(s) twice daily   pantoprazole (PROTONIX) 40 MG tablet TAKE ONE TABLET BY MOUTH TWICE DAILY   potassium chloride SA (KLOR-CON M) 20 MEQ tablet Take 1 tablet (20 mEq total) by mouth daily.   Respiratory Therapy Supplies (NEBULIZER/TUBING/MOUTHPIECE) KIT Use as directed   sucralfate (CARAFATE)  1 g tablet TAKE 1/2 TABLET BY MOUTH FOUR TIMES DAILY may cut pill in half and dissolve in water prior to drinking   No facility-administered medications prior to visit.    Review of Systems  Constitutional:  Negative for fatigue and fever.  Respiratory:  Negative for cough and shortness of breath.   Cardiovascular:  Negative for chest pain and leg swelling.  Gastrointestinal:  Positive for diarrhea. Negative for abdominal pain.  Genitourinary:  Positive for frequency.  Neurological:  Negative for dizziness and headaches.      Objective    BP  130/74 (BP Location: Left Arm, Patient Position: Sitting, Cuff Size: Normal)   Pulse 80   Temp 98 F (36.7 C)   Resp 20   Ht 5\' 4"  (1.626 m)   Wt 202 lb (91.6 kg)   SpO2 95%   BMI 34.67 kg/m    Physical Exam Constitutional:      General: She is awake.     Appearance: She is well-developed.  HENT:     Head: Normocephalic.  Eyes:     Conjunctiva/sclera: Conjunctivae normal.  Cardiovascular:     Rate and Rhythm: Normal rate and regular rhythm.     Heart sounds: Normal heart sounds.  Pulmonary:     Effort: Pulmonary effort is normal.     Breath sounds: Normal breath sounds.  Abdominal:     Palpations: Abdomen is soft.     Tenderness: There is abdominal tenderness in the epigastric area. There is no guarding or rebound.  Skin:    General: Skin is warm.  Neurological:     Mental Status: She is alert and oriented to person, place, and time.  Psychiatric:        Attention and Perception: Attention normal.        Mood and Affect: Mood normal.        Speech: Speech normal.        Behavior: Behavior is cooperative.      No results found for any visits on 11/01/22.  Assessment & Plan     1. Chronic diarrhea Pt takes dicyclomine daily  Advised worsening in symptoms for last ~ week 2/2 abx therapy for UTI  Pt reports her urinary symptoms have resolved.  Recommending returning to normal diet, hydrate, see if diarrhea resolves.  If she returns to her baseline chronic diarrhea, recommending metamucil supplement daily ~ 2-3 days a week.  Return if symptoms worsen or fail to improve.      I, Alfredia Ferguson, PA-C have reviewed all documentation for this visit. The documentation on  11/01/22   for the exam, diagnosis, procedures, and orders are all accurate and complete.  Alfredia Ferguson, PA-C  [facility] [phone] (phone) [phone] (fax)  [facility]

## 2022-11-01 NOTE — Telephone Encounter (Signed)
Initial Comment Caller just missed a call from the office. She was prescribed cefdinir 300mg  yesterday. She has had frequent diarrhea since starting the abx. Translation No No Triage Reason Patient declined Nurse Assessment Nurse: Delford Field, RN, Ivin Booty Date/Time Lamount Cohen Time): 10/29/2022 6:07:02 PM Confirm and document reason for call. If symptomatic, describe symptoms. ---Caller states that she has infection between her legs that is sore and red and painful; when she urinates, it burns. She was seen 2 days ago at the office. She was examined but not placed on medication then. She called yesterday and asked about not being on medication. She was placed on medication yesterday and started it today and she has had diarrhea since. She is taking 2 daily for 5 days. Cefdinir 300 mg. Does the patient have any new or worsening symptoms? ---Yes Will a triage be completed? ---Yes Related visit to physician within the last 2 weeks? ---Yes Does the PT have any chronic conditions? (i.e. diabetes, asthma, this includes High risk factors for pregnancy, etc.) ---Unknown Is this a behavioral health or substance abuse call? ---No Guidelines Guideline Title Affirmed Question Affirmed Notes Nurse Date/Time (Eastern Time) Diarrhea on Antibiotics MODERATE diarrhea (e.g., 4-6 times / day more than normal) Delford Field, RN, Ivin Booty 10/29/2022 6:29:32 PM PLEASE NOTE: All timestamps contained within this report are represented as [place] Standard Time. CONFIDENTIALTY NOTICE: This fax transmission is intended only for the addressee. It contains information that is legally privileged, confidential or otherwise protected from use or disclosure. If you are not the intended recipient, you are strictly prohibited from reviewing, disclosing, copying using or disseminating any of this information or taking any action in reliance on or regarding this information. If you have received this fax in error, please notify us  immediately by telephone so that we can arrange for its return to [place]. Phone: [phone], Toll-Free: [phone], Fax: [phone] Page: 2 of 2 Call Id: [id number] Disp. Time Lamount Cohen Time) Disposition Final User 10/29/2022 6:44:40 PM See HCP within 4 Hours (or PCP triage) Yes Delford Field, RN, Ivin Booty Final Disposition 10/29/2022 6:44:40 PM See HCP within 4 Hours (or PCP triage) Yes Delford Field, RN, Ivin Booty Disposition Overriden: See PCP within 24 Hours Override Reason: Patient's symptoms need a higher level of care Caller Disagree/Comply Comply Caller Understands Yes PreDisposition Call Doctor Care Advice Given Per Guideline SEE HCP (OR PCP TRIAGE) WITHIN 4 HOURS: * IF OFFICE WILL BE CLOSED AND NO PCP (PRIMARY CARE PROVIDER) SECOND-LEVEL TRIAGE: You need to be seen within the next 3 or 4 hours. A nearby Urgent Care Center [facility]) is often a good source of care. Another choice is to go to the ED. Go sooner if you become worse. FLUID THERAPY DURING SEVERE DIARRHEA: * Drink more fluids, at least 8 to 10 cups daily. One cup equals 8 oz (240 ml). CALL BACK IF: * You become worse CARE ADVICE given per Diarrhea on Antibiotics (Adult) guideline. Referrals GO TO FACILITY UNDECIDED

## 2022-11-04 ENCOUNTER — Other Ambulatory Visit (HOSPITAL_BASED_OUTPATIENT_CLINIC_OR_DEPARTMENT_OTHER): Payer: Self-pay

## 2022-11-14 ENCOUNTER — Other Ambulatory Visit: Payer: Self-pay | Admitting: Family

## 2022-11-17 ENCOUNTER — Ambulatory Visit (INDEPENDENT_AMBULATORY_CARE_PROVIDER_SITE_OTHER): Payer: Medicare PPO | Admitting: Family

## 2022-11-17 ENCOUNTER — Encounter: Payer: Self-pay | Admitting: Family

## 2022-11-17 VITALS — BP 115/60 | HR 73 | Temp 97.9°F | Resp 16 | Wt 201.0 lb

## 2022-11-17 DIAGNOSIS — E039 Hypothyroidism, unspecified: Secondary | ICD-10-CM | POA: Diagnosis not present

## 2022-11-17 DIAGNOSIS — B009 Herpesviral infection, unspecified: Secondary | ICD-10-CM

## 2022-11-17 DIAGNOSIS — R3129 Other microscopic hematuria: Secondary | ICD-10-CM | POA: Diagnosis not present

## 2022-11-17 DIAGNOSIS — E118 Type 2 diabetes mellitus with unspecified complications: Secondary | ICD-10-CM | POA: Diagnosis not present

## 2022-11-17 DIAGNOSIS — R102 Pelvic and perineal pain: Secondary | ICD-10-CM | POA: Diagnosis not present

## 2022-11-17 LAB — BASIC METABOLIC PANEL
BUN: 13 mg/dL (ref 6–23)
CO2: 26 mEq/L (ref 19–32)
Calcium: 8.7 mg/dL (ref 8.4–10.5)
Chloride: 105 mEq/L (ref 96–112)
Creatinine, Ser: 1.22 mg/dL — ABNORMAL HIGH (ref 0.40–1.20)
GFR: 41.07 mL/min — ABNORMAL LOW (ref >60.00)
Glucose, Bld: 97 mg/dL (ref 70–99)
Potassium: 4.2 mEq/L (ref 3.5–5.1)
Sodium: 139 mEq/L (ref 135–145)

## 2022-11-17 LAB — TSH: TSH: 3.29 u[IU]/mL (ref 0.35–5.50)

## 2022-11-17 LAB — HEMOGLOBIN A1C: Hgb A1c MFr Bld: 6.7 % — ABNORMAL HIGH (ref 4.6–6.5)

## 2022-11-17 MED ORDER — VALACYCLOVIR HCL 500 MG PO TABS
500.0000 mg | ORAL_TABLET | ORAL | 1 refills | Status: DC
Start: 2022-11-17 — End: 2022-11-29

## 2022-11-17 MED ORDER — CLOBETASOL PROPIONATE 0.05 % EX OINT: 1.0000 | TOPICAL_OINTMENT | Freq: Two times a day (BID) | CUTANEOUS | 0 refills | Status: DC

## 2022-11-17 NOTE — Assessment & Plan Note (Signed)
Maintained on synthroid 25 mcg.  Will update TSH.

## 2022-11-17 NOTE — Assessment & Plan Note (Signed)
Has been diet controlled. Update A1C.

## 2022-11-17 NOTE — Progress Notes (Signed)
Subjective:     Patient ID: Veronica Wolf, female    DOB: [birth year], 82 y.o.   MRN: [id number]  Chief Complaint  Patient presents with   Diarrhea    Here for follow up    HPI  Discussed the use of AI scribe software for clinical note transcription with the patient, who gave verbal consent to proceed.  History of Present Illness   The patient presents today for 2 week follow up with her daughter. She has a history of genital herpes per IgG serologies. She saw Hyman Hopes 2 weeks ago and was treated with a short course of antiviral medication as well as abx for a UTI.  She did a culture of the lesion she saw which was negative for HSV2.     She reports persistent genital discomfort and especially dysuria when the urine touches the labial area.  She reports symptoms have been ongoing for approximately four weeks. The symptoms have not improved.  In addition to the genital discomfort, the patient also reports a recent fall while taking out the trash. The fall resulted in a bruised arm and leg but no head injury. The patient denies any other injuries from the fall.     The patient is accompanied by her daughter.      Health Maintenance Due  Topic Date Due   OPHTHALMOLOGY EXAM  Never done   Zoster Vaccines- Shingrix (1 of 2) Never done   COVID-19 Vaccine (3 - Pfizer risk series) 07/06/2019   HEMOGLOBIN A1C  08/06/2022   INFLUENZA VACCINE  11/04/2022    Past Medical History:  Diagnosis Date   Allergic rhinitis    Allergy    seasonal allergies   Anxiety    CKD (chronic kidney disease) 04/04/2017   COPD (chronic obstructive pulmonary disease) (HCC)    uses inhaler   Depression    Fatty liver    GERD (gastroesophageal reflux disease)    Gout    hx of   Hyperlipidemia    on meds   Hypertension    on meds   Myocardial infarction [facility])    Peptic ulcer 01/04/2003   Vertigo     Past Surgical History:  Procedure Laterality Date   ABDOMINAL HYSTERECTOMY      APPENDECTOMY     BIOPSY  08/13/2020   Procedure: BIOPSY;  Surgeon: Shellia Cleverly, DO;  Location: [facility];  Service: Gastroenterology;;   CORONARY PRESSURE/FFR STUDY N/A 01/27/2022   Procedure: INTRAVASCULAR PRESSURE WIRE/FFR STUDY;  Surgeon: Orbie Pyo, MD;  Location: [facility] INV[facility];  Service: Cardiovascular;  Laterality: N/A;   ESOPHAGOGASTRODUODENOSCOPY (EGD) WITH PROPOFOL N/A 08/13/2020   Procedure: ESOPHAGOGASTRODUODENOSCOPY (EGD) WITH PROPOFOL;  Surgeon: Shellia Cleverly, DO;  Location: [facility];  Service: Gastroenterology;  Laterality: N/A;   LEFT HEART CATH AND CORONARY ANGIOGRAPHY N/A 06/13/2017   Procedure: LEFT HEART CATH AND CORONARY ANGIOGRAPHY;  Surgeon: Marykay Lex, MD;  Location: [facility] INVASIVE CV LAB;  Service: Cardiovascular;  Laterality: N/A;   LEFT HEART CATH AND CORONARY ANGIOGRAPHY N/A 01/27/2022   Procedure: LEFT HEART CATH AND CORONARY ANGIOGRAPHY;  Surgeon: Orbie Pyo, MD;  Location: [facility];  Service: Cardiovascular;  Laterality: N/A;   RIGHT/LEFT HEART CATH AND CORONARY ANGIOGRAPHY N/A 09/16/2017   Procedure: RIGHT/LEFT HEART CATH AND CORONARY ANGIOGRAPHY;  Surgeon: Swaziland, Peter M, MD;  Location: [facility] INVASIVE CV LAB;  Service: Cardiovascular;  Laterality: N/A;   SAVORY DILATION N/A 08/13/2020   Procedure: SAVORY  DILATION;  Surgeon: Shellia Cleverly, DO;  Location: [facility];  Service: Gastroenterology;  Laterality: N/A;   ULTRASOUND GUIDANCE FOR VASCULAR ACCESS  09/16/2017   Procedure: Ultrasound Guidance For Vascular Access;  Surgeon: Swaziland, Peter M, MD;  Location: [facility] INVASIVE CV LAB;  Service: Cardiovascular;;   WISDOM TOOTH EXTRACTION      Family History  Problem Relation Age of Onset   Breast cancer Mother    Stroke Father    Stomach cancer Neg Hx    Colon cancer Neg Hx    Pancreatic cancer Neg Hx    Esophageal cancer Neg Hx    Colon polyps Neg Hx    Rectal cancer Neg Hx     Social History   Socioeconomic  History   Marital status: Widowed    Spouse name: Not on file   Number of children: 1   Years of education: 53   Highest education level: 12th grade  Occupational History   Occupation: Retired  Tobacco Use   Smoking status: Former    Current packs/day: 0.00    Types: Cigarettes    Quit date: 04/24/2000    Years since quitting: 22.5    Passive exposure: Past   Smokeless tobacco: Never  Vaping Use   Vaping status: Never Used  Substance and Sexual Activity   Alcohol use: Not Currently    Comment: has previous hx of ETOH abuse, quit 2014   Drug use: No   Sexual activity: Not Currently  Other Topics Concern   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[place]   Grew up in [place]   Has daughter locally   Social Determinants of Health   Financial Resource Strain: Low Risk  (10/07/2021)   Overall Financial Resource Strain (CARDIA)    Difficulty of Paying Living Expenses: Not very hard  Food Insecurity: Food Insecurity Present (06/09/2022)   Hunger Vital Sign    Worried About Running Out of Food in the Last Year: Sometimes true    Ran Out of Food in the Last Year: Never true  Transportation Needs: Unmet Transportation Needs (10/22/2022)   PRAPARE - Transportation    Lack of Transportation (Medical): Yes    Lack of Transportation (Non-Medical): Yes  Physical Activity: Inactive (05/13/2021)   Exercise Vital Sign    Days of Exercise per Week: 0 days    Minutes of Exercise per Session: 0 min  Stress: Stress Concern Present (05/13/2021)   [employer] of Occupational Health - Occupational Stress Questionnaire    Feeling of Stress : Rather much  Social Connections: Moderately Isolated (05/13/2021)   Social Connection and Isolation Panel [NHANES]    Frequency of Communication with Friends and Family: More than three times a week    Frequency of Social Gatherings with Friends and Family: More than three times a week    Attends Religious Services: 1 to  4 times per year    Active Member of [employer] or Organizations: No    Attends Banker Meetings: Never    Marital Status: Widowed  Intimate Partner Violence: Not At Risk (06/09/2022)   Humiliation, Afraid, Rape, and Kick questionnaire    Fear of Current or Ex-Partner: No    Emotionally Abused: No    Physically Abused: No    Sexually Abused: No    Outpatient Medications Prior to Visit  Medication Sig Dispense Refill   albuterol (PROVENTIL) (2.5 MG/3ML) 0.083% nebulizer solution USE 1 VIAL IN  NEBULIZER EVERY 6 HOURS AS NEEDED FOR WHEEZING AND FOR SHORTNESS OF BREATH 150 mL 0   albuterol (VENTOLIN HFA) 108 (90 Base) MCG/ACT inhaler Inhale 2 puffs into the lungs every 6 (six) hours as needed for wheezing or shortness of breath. 8 g 2   allopurinol (ZYLOPRIM) 100 MG tablet Take 2 tablets (200 mg total) by mouth daily. 180 tablet 0   aspirin 81 MG EC tablet Take 1 tablet (81 mg total) by mouth daily. Swallow whole. 30 tablet 12   atorvastatin (LIPITOR) 80 MG tablet TAKE ONE TABLET BY MOUTH EVERYDAY AT BEDTIME 90 tablet 0   Blood Glucose Monitoring Suppl (ACCU-CHEK GUIDE ME) w/Device KIT Use to test blood sugar in the morning, at noon, and at bedtime. 1 kit 0   colchicine 0.6 MG tablet Take 0.6 mg by mouth daily as needed (gout).     diazepam (VALIUM) 10 MG tablet TAKE ONE TABLET BY MOUTH every 12 hours AS NEEDED FOR ANXIETY 60 tablet 0   dicyclomine (BENTYL) 10 MG capsule Take 1 capsule (10 mg total) by mouth in the morning, at noon, in the evening, and at bedtime. 360 capsule 1   escitalopram (LEXAPRO) 20 MG tablet Take 1 tablet (20 mg total) by mouth every morning. 90 tablet 0   ezetimibe (ZETIA) 10 MG tablet TAKE ONE TABLET BY MOUTH EVERY MORNING 90 tablet 3   famotidine (PEPCID) 20 MG tablet Take 1 tablet (20 mg total) by mouth 2 (two) times daily. 180 tablet 0   isosorbide mononitrate (IMDUR) 60 MG 24 hr tablet Take 1 tablet (60 mg total) by mouth at bedtime. 90 tablet 0    levothyroxine (SYNTHROID) 25 MCG tablet TAKE ONE TABLET BY MOUTH EVERY MORNING 90 tablet 1   lisinopril (ZESTRIL) 2.5 MG tablet TAKE ONE TABLET BY MOUTH EVERYDAY AT BEDTIME 90 tablet 3   meclizine (ANTIVERT) 25 MG tablet TAKE ONE TABLET BY MOUTH daily AS NEEDED FOR dizziness 30 tablet 0   metoprolol succinate (TOPROL-XL) 25 MG 24 hr tablet TAKE 1/2 TABLET BY MOUTH EVERY EVENING 45 tablet 3   nitroGLYCERIN (NITROSTAT) 0.4 MG SL tablet Place 1 tablet (0.4 mg total) under the tongue every 5 (five) minutes as needed for chest pain. 45 tablet 3   nystatin (MYCOSTATIN/NYSTOP) powder APPLY powder TO THE AFFECTED AREA(S) TWICE DAILY 60 g 4   pantoprazole (PROTONIX) 40 MG tablet TAKE ONE TABLET BY MOUTH TWICE DAILY 180 tablet 0   potassium chloride SA (KLOR-CON M) 20 MEQ tablet Take 1 tablet (20 mEq total) by mouth daily. 90 tablet 0   Respiratory Therapy Supplies (NEBULIZER/TUBING/MOUTHPIECE) KIT Use as directed 1 kit 0   sucralfate (CARAFATE) 1 g tablet TAKE 1/2 TABLET BY MOUTH FOUR TIMES DAILY may cut pill in half and dissolve in water prior to drinking 120 tablet 0   clobetasol ointment (TEMOVATE) 0.05 % Apply 1 Application topically 2 (two) times daily. For up to 2 weeks. 30 g 0   No facility-administered medications prior to visit.    Allergies  Allergen Reactions   Ibuprofen Other (See Comments)    Pt has hx of peptic ulcer and diverticulitis.     ROS See HPI    Objective:    Physical Exam Exam conducted with a chaperone present.  Constitutional:      General: She is not in acute distress.    Appearance: Normal appearance. She is well-developed.  HENT:     Head: Normocephalic and atraumatic.     Right  Ear: External ear normal.     Left Ear: External ear normal.  Eyes:     General: No scleral icterus. Neck:     Thyroid: No thyromegaly.  Cardiovascular:     Rate and Rhythm: Normal rate and regular rhythm.     Heart sounds: Normal heart sounds. No murmur heard. Pulmonary:      Effort: Pulmonary effort is normal. No respiratory distress.     Breath sounds: Normal breath sounds. No wheezing.  Genitourinary:    Exam position: Lithotomy position.       Comments: + erythema, swelling and exquisite tenderness surrounding urethral meatus. Several ulcers are noted as well.  Musculoskeletal:     Cervical back: Neck supple.  Skin:    General: Skin is warm and dry.  Neurological:     Mental Status: She is alert. Mental status is at baseline.     Comments: Repeats things during visit  Psychiatric:        Mood and Affect: Mood normal.        Behavior: Behavior normal.        Thought Content: Thought content normal.        Judgment: Judgment normal.      BP 115/60 (BP Location: Right Arm, Patient Position: Sitting, Cuff Size: Small)   Pulse 73   Temp 97.9 F (36.6 C) (Oral)   Resp 16   Wt 201 lb (91.2 kg)   SpO2 95%   BMI 34.50 kg/m  Wt Readings from Last 3 Encounters:  11/17/22 201 lb (91.2 kg)  11/01/22 202 lb (91.6 kg)  10/22/22 196 lb 3.2 oz (89 kg)       Assessment & Plan:   Problem List Items Addressed This Visit       Unprioritized   Hypothyroid    Maintained on synthroid 25 mcg.  Will update TSH.       Relevant Orders   TSH   Herpes simplex type 2 infection    While her culture was negative last visit, clinically looks and acting like herpes.  Has not + HSV IGG.  Her dysuria is secondary to the urine hitting this raw area around the urethral meatus. Will rx with daily preventative valtrex to see if we can keep this suppressed.        Relevant Medications   valACYclovir (VALTREX) 500 MG tablet   Controlled type 2 diabetes mellitus with complication, without long-term current use of insulin (HCC)    Has been diet controlled. Update A1C.       Relevant Orders   HgB A1c   Basic Metabolic Panel (BMET)   Other Visit Diagnoses     Microscopic hematuria    -  Primary   Relevant Orders   Urinalysis, Routine w reflex microscopic    Vaginal pain       Relevant Medications   clobetasol ointment (TEMOVATE) 0.05 %       I am having Luverna P. Full "Elease Hashimoto" start on valACYclovir. I am also having her maintain her colchicine, aspirin EC, nitroGLYCERIN, albuterol, dicyclomine, albuterol, Nebulizer/Tubing/Mouthpiece, levothyroxine, Accu-Chek Guide Me, isosorbide mononitrate, allopurinol, potassium chloride SA, famotidine, escitalopram, metoprolol succinate, lisinopril, ezetimibe, diazepam, meclizine, pantoprazole, atorvastatin, sucralfate, nystatin, and clobetasol ointment.  Meds ordered this encounter  Medications   valACYclovir (VALTREX) 500 MG tablet    Sig: Take 1 tablet (500 mg total) by mouth every other day.    Dispense:  45 tablet    Refill:  1    Order Specific  Question:   Supervising Provider    Answer:   Danise Edge A [[id number]]   clobetasol ointment (TEMOVATE) 0.05 %    Sig: Apply 1 Application topically 2 (two) times daily. For up to 2 weeks.    Dispense:  30 g    Refill:  0    Order Specific Question:   Supervising Provider    Answer:   Danise Edge A [[id number]]

## 2022-11-17 NOTE — Assessment & Plan Note (Signed)
While her culture was negative last visit, clinically looks and acting like herpes.  Has not + HSV IGG.  Her dysuria is secondary to the urine hitting this raw area around the urethral meatus. Will rx with daily preventative valtrex to see if we can keep this suppressed.

## 2022-11-24 ENCOUNTER — Telehealth: Payer: Self-pay | Admitting: Pharmacist

## 2022-11-24 ENCOUNTER — Telehealth: Payer: Medicare PPO

## 2022-11-24 NOTE — Telephone Encounter (Signed)
Patient was scheduled for phone follow up with Clinical Pharmacist Practitioner today to review medications.  Patient's pharmacy - [facility] is closing so wanted to touch based to see if she needed assistance with transition and discuss continuing to get medications in adherence packaging.  Unable to reach patient- no answer Also tried to call her daughter but no answer. LM On VM.  CB# [phone] or [phone]

## 2022-11-25 ENCOUNTER — Telehealth: Payer: Self-pay | Admitting: Family

## 2022-11-25 ENCOUNTER — Other Ambulatory Visit (INDEPENDENT_AMBULATORY_CARE_PROVIDER_SITE_OTHER): Payer: Medicare PPO

## 2022-11-25 DIAGNOSIS — R3129 Other microscopic hematuria: Secondary | ICD-10-CM | POA: Diagnosis not present

## 2022-11-25 LAB — URINALYSIS, ROUTINE W REFLEX MICROSCOPIC
Bilirubin Urine: NEGATIVE
Hgb urine dipstick: NEGATIVE
Ketones, ur: NEGATIVE
Nitrite: NEGATIVE
RBC / HPF: NONE SEEN (ref 0–?)
Specific Gravity, Urine: <=1.005 — AB (ref 1.000–1.030)
Total Protein, Urine: NEGATIVE
Urine Glucose: NEGATIVE
Urobilinogen, UA: 0.2 (ref 0.0–1.0)
pH: 6 (ref 5.0–8.0)

## 2022-11-25 NOTE — Telephone Encounter (Signed)
Called to confirm appt

## 2022-11-25 NOTE — Telephone Encounter (Signed)
Caller Name Billie Vitacco Caller Phone Number [phone] Patient Name Veronica Wolf Patient DOB 25-Jun-1940 Call Type Message Only Information Provided Reason for Call Returning a Call from the Office Initial Comment Caller states she is returning a call back to the office. Disp. Time Disposition Final User 11/24/2022 6:21:04 PM General Information Provided Yes Leodis Sias Call Closed By: Leodis Sias Transaction Date/Time: 11/24/2022 6:17:18 PM (ET)

## 2022-11-26 ENCOUNTER — Other Ambulatory Visit (HOSPITAL_COMMUNITY): Payer: Self-pay

## 2022-11-26 ENCOUNTER — Other Ambulatory Visit: Payer: Medicare PPO | Admitting: Pharmacist

## 2022-11-26 ENCOUNTER — Telehealth: Payer: Self-pay | Admitting: Family

## 2022-11-26 DIAGNOSIS — B009 Herpesviral infection, unspecified: Secondary | ICD-10-CM

## 2022-11-26 DIAGNOSIS — R42 Dizziness and giddiness: Secondary | ICD-10-CM

## 2022-11-26 NOTE — Telephone Encounter (Signed)
**  Pt called stating that [facility] is closing its doors and would like to have her meds routed to the [facility] going forward. Pt has already called them to set it up.**  Prescription Request  11/26/2022  Is this a "Controlled Substance" medicine? Yes  LOV: 11/17/2022  What is the name of the medication or equipment?   diazepam (VALIUM) 10 MG tablet [[id number]]  meclizine (ANTIVERT) 25 MG tablet [[id number]]  Have you contacted your pharmacy to request a refill? Yes   Which pharmacy would you like this sent to?   [facility] [address] [place], [place] [address] P: [phone]  Patient notified that their request is being sent to the clinical staff for review and that they should receive a response within 2 business days.   Please advise at Mobile [phone] (mobile)

## 2022-11-26 NOTE — Telephone Encounter (Signed)
Called patient back - she needs assistance with transitioning over the [employer]. She will get her maintenance meds in packaging and as needed medications in bottles. See phone visit for more details.

## 2022-11-26 NOTE — Progress Notes (Signed)
11/26/2022 Name: Veronica Wolf MRN: [id number] DOB: 31-Jan-1941  Chief Complaint  Patient presents with   Medication Management    Adherence     Veronica Wolf is a 82 y.o. year old female who presented for a telephone visit.   They were referred to the pharmacist by their PCP for assistance in managing medication access.   Subjective:  Care Team: Primary Care Provider: [name], NP ; Next Scheduled Visit: not currently scheduled Cardiologist: Dr Servando Salina; Next Scheduled Visit: not currently scheduled Gastroenterologist: Wilmon Pali; Next Scheduled Visit: 12/08/2022  Medication Access/Adherence  Current Pharmacy:   [facility] [address] [place] [address] Phone: [phone] Fax: [phone]  Patient reports affordability concerns with their medications: No  Patient reports access/transportation concerns to their pharmacy: Yes  Patient reports adherence concerns with their medications:  Yes  - using adherence packaging to help with adherence.   Medication Management:  Current adherence strategy: using adherence packaging. Patient will need to transition to another pharmacy to do adherence packaging since [facility] is closing.   [facility] does not do half tablets in packaging. She is currently taking 0.5 tablet of metoprolol ER 25mg  and also 0.5 tablet 4 times a day of sucralfate.   Patient's current packaging regimen: Morning: levothyroxine , pantoprazole 40mg , famotidine20mg , allopurinol 100mg  2 tabs, escitalopram 20mg , ezetimibe 10mg , sucralfate 0.5 tablet,  Noon: sucralfate 0.5 tablet, potassium Evening: sucralfate 0.5 tablet, pantoprazole 40mg  Bedtime: atorvastatin 80mg , lisinopril 2.5mg , metoprolol 25mg  0.5 tablet, sucralfate 0.5 tablet, famotidine 20mg    Patient reports Fair adherence to medications  Patient reports the following barriers to adherence:  Multiple comorbidities Complex medication  regimen Lack of transportation   Objective:  Lab Results  Component Value Date   HGBA1C 6.7 (H) 11/17/2022    Lab Results  Component Value Date   CREATININE 1.22 (H) 11/17/2022   BUN 13 11/17/2022   NA 139 11/17/2022   K 4.2 11/17/2022   CL 105 11/17/2022   CO2 26 11/17/2022    Lab Results  Component Value Date   CHOL 134 07/26/2022   HDL 43.00 07/26/2022   LDLCALC 62 07/26/2022   TRIG 148.0 07/26/2022   CHOLHDL 3 07/26/2022    Medications Reviewed Today     Reviewed by [name], RPH-CPP (Pharmacist) on 11/26/22 at 1659  Med List Status: <None>   Medication Order Taking? Sig Documenting Provider Last Dose Status Informant  albuterol (PROVENTIL) (2.5 MG/3ML) 0.083% nebulizer solution [id number]  USE 1 VIAL IN NEBULIZER EVERY 6 HOURS AS NEEDED FOR WHEEZING AND FOR SHORTNESS OF Garnet Sierras, Efraim Kaufmann, NP  Active   albuterol (VENTOLIN HFA) 108 (90 Base) MCG/ACT inhaler [id number]  Inhale 2 puffs into the lungs every 6 (six) hours as needed for wheezing or shortness of breath. [name], NP  Active   allopurinol (ZYLOPRIM) 100 MG tablet [id number] Yes Take 2 tablets (200 mg total) by mouth daily. [name], NP Taking Active   aspirin 81 MG EC tablet [id number] Yes Take 1 tablet (81 mg total) by mouth daily. Swallow whole. Thomasene Ripple, DO Taking Active            Med Note Clydie Braun, Masen Luallen B   Wed Oct 07, 2021 11:19 AM)    atorvastatin (LIPITOR) 80 MG tablet [id number]  TAKE ONE TABLET BY MOUTH EVERYDAY AT BEDTIME Georgeanna Lea, MD  Active   Blood Glucose Monitoring Suppl (ACCU-CHEK GUIDE ME) w/Device KIT [id number]  Use to test blood sugar  in the morning, at noon, and at bedtime. [name], NP  Active   clobetasol ointment (TEMOVATE) 0.05 % [id number]  Apply 1 Application topically 2 (two) times daily. For up to 2 weeks. [name], NP  Active   colchicine 0.6 MG tablet [id number]  Take 0.6 mg by mouth daily as needed (gout). [provider]  Active   diazepam (VALIUM) 10 MG tablet [id number]  TAKE ONE TABLET BY MOUTH every 12 hours AS NEEDED FOR ANXIETY [name], NP  Active   dicyclomine (BENTYL) 10 MG capsule [id number] No Take 1 capsule (10 mg total) by mouth in the morning, at noon, in the evening, and at bedtime.  Patient not taking: Reported on 11/26/2022   [name], NP Not Taking Active            Med Note Clydie Braun, [place] B   Fri Nov 26, 2022  3:46 PM) Uses if needed  escitalopram (LEXAPRO) 20 MG tablet [id number]  Take 1 tablet (20 mg total) by mouth every morning. [name], NP  Active   ezetimibe (ZETIA) 10 MG tablet [id number]  TAKE ONE TABLET BY MOUTH EVERY MORNING Tobb, Kardie, DO  Active   famotidine (PEPCID) 20 MG tablet [id number]  Take 1 tablet (20 mg total) by mouth 2 (two) times daily. [name], NP  Active   isosorbide mononitrate (IMDUR) 60 MG 24 hr tablet [id number]  Take 1 tablet (60 mg total) by mouth at bedtime. [name], NP  Active   levothyroxine (SYNTHROID) 25 MCG tablet [id number]  TAKE ONE TABLET BY MOUTH EVERY MORNING [name], NP  Active   lisinopril (ZESTRIL) 2.5 MG tablet [id number]  TAKE ONE TABLET BY MOUTH EVERYDAY AT BEDTIME Tobb, Kardie, DO  Active   meclizine (ANTIVERT) 25 MG tablet [id number]  TAKE ONE TABLET BY MOUTH daily AS NEEDED FOR dizziness [name], NP  Active   metoprolol succinate (TOPROL-XL) 25 MG 24 hr tablet [id number]  TAKE 1/2 TABLET BY MOUTH EVERY EVENING Tobb, Kardie, DO  Active   nitroGLYCERIN (NITROSTAT) 0.4 MG SL tablet [id number]  Place 1 tablet (0.4 mg total) under the tongue every 5 (five) minutes as needed for chest pain. Tobb, Kardie, DO  Active   nystatin (MYCOSTATIN/NYSTOP) powder [id number]  APPLY powder TO THE AFFECTED AREA(S) TWICE DAILY [name], NP  Active   pantoprazole (PROTONIX) 40 MG tablet [id number]  TAKE ONE TABLET BY MOUTH TWICE DAILY [name], NP  Active    potassium chloride SA (KLOR-CON M) 20 MEQ tablet [id number]  Take 1 tablet (20 mEq total) by mouth daily. [name], NP  Active   Respiratory Therapy Supplies (NEBULIZER/TUBING/MOUTHPIECE) Andria Rhein [id number]  Use as directed [name], NP  Active   sucralfate (CARAFATE) 1 g tablet [id number]  TAKE 1/2 TABLET BY MOUTH FOUR TIMES DAILY may cut pill in half and dissolve in water prior to drinking [name], NP  Active   valACYclovir (VALTREX) 500 MG tablet [id number]  Take 1 tablet (500 mg total) by mouth every other day. [name], NP  Active               Assessment/Plan:   Medication Management: - Will assist with transition to using [facility] Adherence packaging. Checking Sherlynn Stalls, Pharm D to see if [facility] can get metoprolol succinate 12.5mg  tablets since [facility] does not include halved tablets in packaging.  - Will also need to change Rx for sucralfate to take 1 tablet 4 times a day -  Valacyclovir will need to be added to packaging - taking 500mg  every OTHER day. - Will request Rxs for all packaged medications from PCP or cardiologist once I hear from [facility] about metoprolol 12.5mg  - Patient has requested refills for diazepam and meclizine from PCP - these are as needed meds and are not included in packaging. She has asked that they be delivered ASAP. Patient is aware soonest they can be delivered is Tuesday.   Recommended packaging schedule:  Morning: levothyroxine , pantoprazole 40mg , famotidine20mg , allopurinol 100mg  2 tabs, escitalopram 20mg , ezetimibe 10mg , sucralfate 1 tablet,  Noon: sucralfate 1 tablet, potassium, valacyclovir 500mg  every OTHER day Evening: sucralfate 1 tablet, pantoprazole 40mg  Bedtime: atorvastatin 80mg , lisinopril 2.5mg , metoprolol 12.5mg  1 tablet, sucralfate 1 tablet, famotidine 20mg   If there is a limit on the number of tablets that can be included in packaging - would consider changing  morning package to levothyroxine , pantoprazole 40mg  and famotidine 20mg  Add breakfast package: allopurinol 100mg  2 tabs, escitalopram 20mg , ezetimibe 10mg , sucralfate 1 tablet Noon: sucralfate 1 tablet, potassium, valacyclovir 500mg  every OTHER day Evening: sucralfate 1 tablet, pantoprazole 40mg  Bedtime: atorvastatin 80mg , lisinopril 2.5mg , metoprolol 12.5mg  1 tablet, sucralfate 1 tablet, famotidine 20mg    Follow Up Plan: August 28th  [name], PharmD Clinical Pharmacist [facility]

## 2022-11-27 ENCOUNTER — Other Ambulatory Visit (HOSPITAL_COMMUNITY): Payer: Self-pay

## 2022-11-28 LAB — URINE CULTURE
MICRO NUMBER:: 15368167
SPECIMEN QUALITY:: ADEQUATE

## 2022-11-29 ENCOUNTER — Other Ambulatory Visit (HOSPITAL_COMMUNITY): Payer: Self-pay

## 2022-11-29 ENCOUNTER — Telehealth: Payer: Self-pay | Admitting: Family

## 2022-11-29 ENCOUNTER — Other Ambulatory Visit: Payer: Self-pay | Admitting: Family Medicine

## 2022-11-29 ENCOUNTER — Other Ambulatory Visit: Payer: Self-pay

## 2022-11-29 DIAGNOSIS — N3000 Acute cystitis without hematuria: Secondary | ICD-10-CM

## 2022-11-29 MED ORDER — FAMOTIDINE 20 MG PO TABS
20.0000 mg | ORAL_TABLET | Freq: Two times a day (BID) | ORAL | 0 refills | Status: DC
Start: 2022-11-29 — End: 2022-12-30
  Filled 2022-11-29 – 2022-12-29 (×3): qty 60, 30d supply, fill #0

## 2022-11-29 MED ORDER — FAMOTIDINE 20 MG PO TABS
20.0000 mg | ORAL_TABLET | Freq: Two times a day (BID) | ORAL | 0 refills | Status: DC
  Filled 2022-12-02 – 2022-12-03 (×3): qty 60, 30d supply, fill #0

## 2022-11-29 MED ORDER — DIAZEPAM 10 MG PO TABS
10.0000 mg | ORAL_TABLET | Freq: Two times a day (BID) | ORAL | 0 refills | Status: DC | PRN
  Filled 2022-11-29 – 2022-12-03 (×3): qty 60, 30d supply, fill #0

## 2022-11-29 MED ORDER — MECLIZINE HCL 25 MG PO TABS
25.0000 mg | ORAL_TABLET | Freq: Three times a day (TID) | ORAL | 0 refills | Status: DC | PRN
  Filled 2022-11-29 – 2022-12-03 (×3): qty 30, 10d supply, fill #0

## 2022-11-29 MED ORDER — CIPROFLOXACIN HCL 250 MG PO TABS: 250.0000 mg | ORAL_TABLET | Freq: Two times a day (BID) | ORAL | 0 refills | Status: DC

## 2022-11-29 MED ORDER — ALLOPURINOL 100 MG PO TABS
200.0000 mg | ORAL_TABLET | Freq: Every day | ORAL | 0 refills | Status: DC
  Filled 2022-11-29: qty 60, 30d supply, fill #0
  Filled 2022-12-02: qty 180, 30d supply, fill #0
  Filled 2022-12-28 – 2022-12-29 (×2): qty 60, 30d supply, fill #0
  Filled 2023-01-24: qty 60, 30d supply, fill #1
  Filled 2023-02-23 (×2): qty 60, 30d supply, fill #2

## 2022-11-29 MED ORDER — ATORVASTATIN CALCIUM 80 MG PO TABS
80.0000 mg | ORAL_TABLET | Freq: Every day | ORAL | 0 refills | Status: DC
  Filled 2022-11-29 – 2022-12-03 (×4): qty 30, 30d supply, fill #0
  Filled 2022-12-28: qty 30, 30d supply, fill #1

## 2022-11-29 MED ORDER — VALACYCLOVIR HCL 500 MG PO TABS
500.0000 mg | ORAL_TABLET | ORAL | 1 refills | Status: DC
  Filled 2022-12-30: qty 45, 90d supply, fill #0
  Filled 2023-01-05: qty 15, 30d supply, fill #0
  Filled 2023-02-24: qty 15, 30d supply, fill #1
  Filled 2023-03-25: qty 15, 30d supply, fill #2
  Filled 2023-04-14 – 2023-04-19 (×3): qty 15, 30d supply, fill #3

## 2022-11-29 MED ORDER — PANTOPRAZOLE SODIUM 40 MG PO TBEC
40.0000 mg | DELAYED_RELEASE_TABLET | Freq: Two times a day (BID) | ORAL | 0 refills | Status: DC
  Filled 2022-11-29 – 2022-12-03 (×4): qty 60, 30d supply, fill #0
  Filled 2022-12-28 – 2022-12-30 (×2): qty 60, 30d supply, fill #1

## 2022-11-29 MED ORDER — POTASSIUM CHLORIDE CRYS ER 20 MEQ PO TBCR
20.0000 meq | EXTENDED_RELEASE_TABLET | Freq: Every day | ORAL | 0 refills | Status: DC
  Filled 2022-12-02: qty 30, 30d supply, fill #0

## 2022-11-29 MED ORDER — SUCRALFATE 1 G PO TABS: 0.5000 g | ORAL_TABLET | Freq: Four times a day (QID) | ORAL | 0 refills | Status: DC

## 2022-11-29 MED ORDER — VALACYCLOVIR HCL 500 MG PO TABS
500.0000 mg | ORAL_TABLET | ORAL | 1 refills | Status: DC
  Filled 2022-11-29: qty 45, 90d supply, fill #0
  Filled 2022-12-01: qty 15, 30d supply, fill #0

## 2022-11-29 MED ORDER — SUCRALFATE 1 G PO TABS
0.5000 g | ORAL_TABLET | Freq: Four times a day (QID) | ORAL | 0 refills | Status: DC
  Filled 2022-11-29: qty 120, 30d supply, fill #0

## 2022-11-29 MED ORDER — BENZONATATE 100 MG PO CAPS: 100.0000 mg | ORAL_CAPSULE | Freq: Three times a day (TID) | ORAL | 0 refills | Status: DC | PRN

## 2022-11-29 MED ORDER — ALBUTEROL SULFATE HFA 108 (90 BASE) MCG/ACT IN AERS
2.0000 | INHALATION_SPRAY | Freq: Four times a day (QID) | RESPIRATORY_TRACT | 2 refills | Status: DC | PRN
  Filled 2022-11-29: qty 6.7, 30d supply, fill #0
  Filled 2022-12-02 – 2022-12-03 (×2): qty 6.7, 25d supply, fill #0
  Filled 2022-12-29: qty 6.7, 25d supply, fill #1
  Filled 2023-01-24: qty 6.7, 25d supply, fill #2

## 2022-11-29 MED ORDER — POTASSIUM CHLORIDE CRYS ER 20 MEQ PO TBCR
20.0000 meq | EXTENDED_RELEASE_TABLET | Freq: Every day | ORAL | 0 refills | Status: DC
  Filled 2022-11-29 – 2022-12-03 (×4): qty 30, 30d supply, fill #0
  Filled 2022-12-28 – 2022-12-29 (×2): qty 30, 30d supply, fill #1
  Filled 2023-01-24: qty 30, 30d supply, fill #2

## 2022-11-29 MED ORDER — PANTOPRAZOLE SODIUM 40 MG PO TBEC
40.0000 mg | DELAYED_RELEASE_TABLET | Freq: Two times a day (BID) | ORAL | 0 refills | Status: DC
  Filled 2022-12-28 – 2022-12-29 (×2): qty 60, 30d supply, fill #0

## 2022-11-29 MED ORDER — ALLOPURINOL 100 MG PO TABS
200.0000 mg | ORAL_TABLET | Freq: Every day | ORAL | 0 refills | Status: DC
  Filled 2022-11-29 – 2022-12-03 (×3): qty 60, 30d supply, fill #0

## 2022-11-29 MED ORDER — DICYCLOMINE HCL 10 MG PO CAPS
10.0000 mg | ORAL_CAPSULE | Freq: Four times a day (QID) | ORAL | 1 refills | Status: DC
  Filled 2022-11-29 – 2022-12-29 (×3): qty 120, 30d supply, fill #0
  Filled 2023-01-24: qty 120, 30d supply, fill #1
  Filled 2023-02-23 (×2): qty 120, 30d supply, fill #2

## 2022-11-29 MED ORDER — ATORVASTATIN CALCIUM 80 MG PO TABS
80.0000 mg | ORAL_TABLET | Freq: Every day | ORAL | 0 refills | Status: DC
  Filled 2022-12-29: qty 30, 30d supply, fill #0
  Filled 2023-01-24: qty 30, 30d supply, fill #1
  Filled 2023-02-23 (×2): qty 30, 30d supply, fill #2

## 2022-11-29 MED FILL — Ezetimibe Tab 10 MG: ORAL | 30 days supply | Qty: 30 | Fill #0 | Status: CN

## 2022-11-29 MED FILL — Metoprolol Succinate Tab ER 24HR 25 MG (Tartrate Equiv): ORAL | 30 days supply | Qty: 15 | Fill #0 | Status: CN

## 2022-11-29 MED FILL — Lisinopril Tab 2.5 MG: ORAL | 30 days supply | Qty: 30 | Fill #0 | Status: CN

## 2022-11-29 MED FILL — Escitalopram Oxalate Tab 20 MG (Base Equiv): ORAL | 30 days supply | Qty: 30 | Fill #0 | Status: CN

## 2022-11-29 NOTE — Telephone Encounter (Signed)
Called patient patient to let her know that they are working on her prescription - it has not been sent to [facility] yet. In fact per her request it will be sent to [facility] instead since [facility] is closing.

## 2022-11-29 NOTE — Telephone Encounter (Signed)
Medications sent to [facility].  Provider to send controlled medications

## 2022-11-29 NOTE — Addendum Note (Signed)
Addended by: [name] on: 11/29/2022 04:19 PM   Modules accepted: Orders

## 2022-11-29 NOTE — Telephone Encounter (Signed)
Pt is calling back requesting to speak with a nurse as soon as possible. She states she is having an issue getting [facility].

## 2022-11-29 NOTE — Telephone Encounter (Signed)
Please sent Valium and escitalopram Changing pharmacy due to [facility].

## 2022-11-29 NOTE — Addendum Note (Signed)
Addended by: Wilford Corner on: 11/29/2022 02:16 PM   Modules accepted: Orders

## 2022-11-29 NOTE — Telephone Encounter (Signed)
Pt called & requested to speak with Efraim Kaufmann or Windell Moulding to discuss her medications. Please call & advise pt.

## 2022-11-29 NOTE — Telephone Encounter (Signed)
It looks like prescription has been sent to PCP's CMA and they are working on it.

## 2022-11-30 ENCOUNTER — Other Ambulatory Visit (HOSPITAL_COMMUNITY): Payer: Self-pay

## 2022-11-30 ENCOUNTER — Other Ambulatory Visit: Payer: Self-pay

## 2022-11-30 MED ORDER — PROBIOTIC DAILY PO CAPS
1.0000 | ORAL_CAPSULE | Freq: Every day | ORAL | 0 refills | Status: AC
Start: 2022-11-30 — End: ?
  Filled 2022-11-30 – 2022-12-30 (×2): qty 120, fill #0

## 2022-11-30 MED ORDER — SUCRALFATE 1 G PO TABS
1.0000 g | ORAL_TABLET | Freq: Three times a day (TID) | ORAL | 0 refills | Status: DC
  Filled 2022-11-30 – 2022-12-03 (×4): qty 90, 30d supply, fill #0

## 2022-11-30 MED ORDER — CIPROFLOXACIN HCL 250 MG PO TABS
250.0000 mg | ORAL_TABLET | Freq: Two times a day (BID) | ORAL | 0 refills | Status: AC
  Filled 2022-11-30 – 2022-12-30 (×2): qty 6, 3d supply, fill #0

## 2022-11-30 NOTE — Telephone Encounter (Signed)
Pt called to request a call back about her 2 prescriptions and whether she needs to pick them up or will they be delivered. Advd patient that she has a phone appt scheduled tomorrow with Tammy. Pt still requesting a call back.

## 2022-11-30 NOTE — Telephone Encounter (Signed)
Spoke with patient - I will provide an updated medication list for her when I visit. Home visit was scheduled for Friday 8/30 when she will have her new packaging available to review.

## 2022-11-30 NOTE — Telephone Encounter (Addendum)
Tried to call [facility] twice but after holding for 5 minutes was forwarded to VM.  Spoke with [facility] pharmacy regarding diazepam and meclizine prescriptions. They can fill today and mail out today. Patient would likely received Thursday.  Veronica Wolf is Ok with getting these Rx's Thursday.   Patient also will start to use Adherence packaging. The only medication that will not be included in the packaging from [facility] but was in her previous packaging from [facility] if the half tablet of metoprolol 25mg  that she takes at night. Patient is aware that metoprolol 25mg  will be in a bottle and she will need to half the tablets and take 1/2 at night along with there other packaged medications.   Will also change sucralfate to 1 gram 3 times a day.   Below is planned medication schedule.   Morning (30 minutes before breakfast) package: levothyroxine , pantoprazole 40mg  and famotidine 20mg  Breakfast package: allopurinol 100mg  2 tabs, escitalopram 20mg , ezetimibe 10mg , sucralfate 1 tablet Noon: sucralfate 1 tablet, potassium, valacyclovir 500mg  every OTHER day Evening: sucralfate 1 tablet, pantoprazole 40mg  Bedtime: atorvastatin 80mg , lisinopril 2.5mg , famotidine 20mg   (take 0.5 tablet metoprolol succinate 25mg  with evening package)

## 2022-11-30 NOTE — Addendum Note (Signed)
Addended bySilvio Pate on: 11/30/2022 11:21 AM   Modules accepted: Orders

## 2022-11-30 NOTE — Telephone Encounter (Signed)
Pt states she was told to have a list of all of her medications for her appt tmr. She has lost her list and stated she does not know how to proceed. States she has a house visit with [employer] Please advise how to proceed.

## 2022-11-30 NOTE — Addendum Note (Signed)
Addended by: [name] B on: 11/30/2022 02:25 PM   Modules accepted: Orders

## 2022-12-01 ENCOUNTER — Telehealth: Payer: Self-pay | Admitting: Family

## 2022-12-01 ENCOUNTER — Other Ambulatory Visit: Payer: Self-pay

## 2022-12-01 ENCOUNTER — Telehealth: Payer: Medicare PPO

## 2022-12-01 ENCOUNTER — Other Ambulatory Visit (HOSPITAL_COMMUNITY): Payer: Self-pay

## 2022-12-01 NOTE — Telephone Encounter (Signed)
Pt requesting call back from pharmacist asap. Pt did not elaborate

## 2022-12-01 NOTE — Telephone Encounter (Signed)
I called patient back. She reported she had diarrhea today. She also states she had chest pain and wants to know if she take sucralfate which was prescribed the last time she had chest / stomach pain.  Advised patient that she should go to the ER or call 911 if she is having chest pain.   She voiced understanding and then stated that she was receiving a call from her PCP's nurse.

## 2022-12-01 NOTE — Telephone Encounter (Signed)
Called patient about her symptoms and she reports she started taking sucralfate and she is feeling much better. She will continue to take and call us back if her symptoms return.

## 2022-12-01 NOTE — Telephone Encounter (Signed)
Pt is having diarrhea and wants to know what she did last time. Would like a call from Windell Moulding asap to discuss

## 2022-12-02 ENCOUNTER — Other Ambulatory Visit: Payer: Self-pay

## 2022-12-02 ENCOUNTER — Other Ambulatory Visit (HOSPITAL_COMMUNITY): Payer: Self-pay

## 2022-12-02 ENCOUNTER — Other Ambulatory Visit: Payer: Self-pay | Admitting: Family

## 2022-12-02 MED ORDER — LEVOTHYROXINE SODIUM 25 MCG PO TABS
25.0000 ug | ORAL_TABLET | Freq: Every morning | ORAL | 1 refills | Status: DC
  Filled 2022-12-02 – 2022-12-03 (×4): qty 30, 30d supply, fill #0
  Filled 2022-12-28 – 2022-12-29 (×2): qty 30, 30d supply, fill #1
  Filled 2023-01-24: qty 30, 30d supply, fill #2
  Filled 2023-02-23 (×2): qty 30, 30d supply, fill #3
  Filled 2023-03-10 – 2023-03-24 (×5): qty 30, 30d supply, fill #4
  Filled 2023-04-14 – 2023-04-19 (×2): qty 30, 30d supply, fill #5

## 2022-12-02 MED FILL — Ezetimibe Tab 10 MG: ORAL | 30 days supply | Qty: 30 | Fill #0 | Status: CN

## 2022-12-02 MED FILL — Metoprolol Succinate Tab ER 24HR 25 MG (Tartrate Equiv): ORAL | 30 days supply | Qty: 15 | Fill #0 | Status: AC

## 2022-12-02 MED FILL — Escitalopram Oxalate Tab 20 MG (Base Equiv): ORAL | 30 days supply | Qty: 30 | Fill #0 | Status: CN

## 2022-12-02 MED FILL — Lisinopril Tab 2.5 MG: ORAL | 30 days supply | Qty: 30 | Fill #0 | Status: CN

## 2022-12-02 NOTE — Telephone Encounter (Signed)
Patient called office regarding appointment tomorrow. I have also received message from Veronica Wolf / [facility] that patient needed me to call her as well. They state that she needs to leave payment method on file as pharmacy. Patient reports her son in law, Veronica Wolf,  handles her finances and she asks that I call her daughter Veronica Wolf who will get in touch will Mr. Veronica Wolf. Spoke with patient's daughter Veronica Wolf and provided phone number for [employer] to forward to Mr. Veronica Wolf so that he could ask about total cost of medications and supply method of payment.   Patient sounded well on the phone. She is less confused about appointment time after speaking with our front desk staff.  I asked about her pain. She states that she was having stomach pain. She was instructed yesterday to continue to take sucralfate by PCP. She reports that pain is better since she has been taking sucralfate.   [facility] diazepam, meclizine and packaged medications and sent to [facility] pharmacy so that I can take to patient when I see her for home visit tomorrow 12/03/2022.  Patient also asked to add albuterol to order.

## 2022-12-02 NOTE — Telephone Encounter (Signed)
Pt called back to request a call back asap. Pt was advised she has appt tomorrow but she wants to speak to her before the appt. Pt said she is in a lot of pain. Advised her to go to the ER per note from Tammy yesterday. Pt said she wasn't in pain yesterday but she is in pain now. Pt thought she had appt with Tammy at 8 or 9 am tomorrow. Advised patient her appt is scheduled for 1:00 pm in her home. Please call patient.

## 2022-12-02 NOTE — Telephone Encounter (Signed)
Updated Rx for levothyroxine sent to pharmacy

## 2022-12-03 ENCOUNTER — Telehealth: Payer: Self-pay | Admitting: Family

## 2022-12-03 ENCOUNTER — Other Ambulatory Visit (HOSPITAL_BASED_OUTPATIENT_CLINIC_OR_DEPARTMENT_OTHER): Payer: Self-pay

## 2022-12-03 ENCOUNTER — Other Ambulatory Visit: Payer: Self-pay

## 2022-12-03 ENCOUNTER — Other Ambulatory Visit: Payer: Medicare PPO | Admitting: Pharmacist

## 2022-12-03 MED ORDER — ISOSORBIDE MONONITRATE ER 60 MG PO TB24
60.0000 mg | ORAL_TABLET | Freq: Every day | ORAL | 0 refills | Status: DC
Start: 2022-12-03 — End: 2022-12-29
  Filled 2022-12-03 – 2022-12-07 (×2): qty 30, 30d supply, fill #0

## 2022-12-03 MED FILL — Ezetimibe Tab 10 MG: ORAL | 30 days supply | Qty: 30 | Fill #0 | Status: AC

## 2022-12-03 MED FILL — Lisinopril Tab 2.5 MG: ORAL | 30 days supply | Qty: 30 | Fill #0 | Status: AC

## 2022-12-03 MED FILL — Escitalopram Oxalate Tab 20 MG (Base Equiv): ORAL | 30 days supply | Qty: 30 | Fill #0 | Status: AC

## 2022-12-03 NOTE — Progress Notes (Signed)
12/03/2022 Name: Veronica Wolf MRN: [id number] DOB: 12/29/1940  Chief Complaint  Patient presents with   Medication Management    Follow up    TAVIE OHLSEN is a 82 y.o. year old female who presented for an in-person home visit today.     They were referred to the pharmacist by their PCP for assistance in managing medication access.   Subjective:  Care Team: Primary Care Provider: [name], NP ; Next Scheduled Visit: not currently scheduled Cardiologist: Dr Servando Salina; Next Scheduled Visit: not currently scheduled Gastroenterologist: Wilmon Pali; Next Scheduled Visit: 12/08/2022   Medication Management / Adherence:  Current adherence strategy: using adherence packaging.   Current Pharmacy:   [facility] [address] [place] [address] Phone: [phone] Fax: [phone]  Patient reports affordability concerns with their medications: No  Patient reports access/transportation concerns to their pharmacy: Yes  Patient reports adherence concerns with their medications:  Yes  - using adherence packaging to help with adherence. She is transitioning over from using [facility] adherence packaging to using [place] [facility] adherence packaging.   She has been out of diazepam for the last 4 to 5 days. It has been filled by [facility] pharmacy and I picked up and delivered to patient today along with meclizine, albuterol inhaler, metoprolol and her Packaged medications.   She is due to start medication packages tomorrow, Saturday August 31st however she reports she does not have all her medications to take for tonight.    Patient reports Fair adherence to medications  Patient reports the following barriers to adherence:  Multiple comorbidities Complex medication regimen Lack of transportation   Patient has several other questions - she asked if she could take Ibuprofen for pain.  She also found a vial with her name on it in  her grandson's room and she wondered what it was for. It appears that it is the sampling stick and vial for an FOBT test, but that is the only part of the testing kit that she still has. Looks like it was given to patient in June 2024.   Patient's daughter also mentioned that Mrs. Adolph fell a few days ago when she was taking her trash out to the curb. She eventually was able to get herself up but took about 10 minutes. She denies getting dizzy - she states her feet just got out from under her.   Objective:  Lab Results  Component Value Date   HGBA1C 6.7 (H) 11/17/2022    Lab Results  Component Value Date   CREATININE 1.22 (H) 11/17/2022   BUN 13 11/17/2022   NA 139 11/17/2022   K 4.2 11/17/2022   CL 105 11/17/2022   CO2 26 11/17/2022    Lab Results  Component Value Date   CHOL 134 07/26/2022   HDL 43.00 07/26/2022   LDLCALC 62 07/26/2022   TRIG 148.0 07/26/2022   CHOLHDL 3 07/26/2022    Medications Reviewed Today   Medications were not reviewed in this encounter       Assessment/Plan:   Medication Management: - Valacyclovir was not able to be added to her packaging because it was too early to refill. Patient has only 4 tablets left which will last 8 days. She should have had enough to last until 12/22/2022. I suspect she has been taking 1 tablet daily instead of every other day. Which is not harmful since daily would be a treatment dose. Patient education provided and she  verbalizes will only take every other day. She can get refill around 9/11.  - Reviewed medications in her packaging. It looks like due to the number of tablets she takes in the morning she has 2 packages to take. This was explained to patient and she voiced understanding of how to take meds with her adherence packaging. Since she did not have enough medication on hand for tonight's dose, I took last 2 packages in her roll out (would have been evening and night dose for 01/03/2023) and she will use these  tonight. Will just need to make sure her next packaging starts the evening of 9/30 or if pharmacy cannot do that, then have a full day for 9/30 and have her remove the morning / noon packs.  - Patient is also aware she will need to take metoprolol 25mg  0.5 tablet at bedtime from a bottle since half tablets cannot be included in packaging. Assisted patient in halving tablets. She will put bottle of metoprolol by her bed so that she will remember to take at night.  -Patient requested if possible to having only 1 package at breakfast, 1 package at noon and 1 package at night but due to each package limited to just 5 medications, we might not be able to do this. Will work with pharmacy to see what can bo done.    - It looks like isosorbide was left out of packaging this time. Will request it to be dispensed in a bottle and mailed to patient.   - Will consult with PCP to see if she would recommend another FOBT kit  be given to patient.  - Discussed fall prevention. Suggest she try to get a neighbor to take her trash to the curb. Also suggest she consider a medic alert system. She is working with someone on this now.   -    Follow Up Plan: 1 to 2 weeks.   [name], PharmD Clinical Pharmacist [facility]

## 2022-12-03 NOTE — Telephone Encounter (Signed)
[facility] called stating that they have made several attempts to contact her for scheduling with no answer.

## 2022-12-04 ENCOUNTER — Other Ambulatory Visit (HOSPITAL_COMMUNITY): Payer: Self-pay

## 2022-12-04 ENCOUNTER — Other Ambulatory Visit (HOSPITAL_BASED_OUTPATIENT_CLINIC_OR_DEPARTMENT_OTHER): Payer: Self-pay

## 2022-12-07 ENCOUNTER — Other Ambulatory Visit: Payer: Self-pay

## 2022-12-07 ENCOUNTER — Other Ambulatory Visit (HOSPITAL_COMMUNITY): Payer: Self-pay

## 2022-12-08 ENCOUNTER — Ambulatory Visit: Payer: Medicare PPO | Admitting: Nurse Practitioner

## 2022-12-08 NOTE — Progress Notes (Deleted)
ASSESSMENT & PLAN   82 y.o.  female known to Dr. Barron Alvine  GERD   History of COPD, CKD, CAD, hypertension, depression, anxiety, PUD,  sigmoid diverticulosis see PMH below for additional history  HPI   Brief GI history  Patient was last evaluated in 2022. 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  Area was not traversable.  She underwent another EGD in May 2022 with Savary dilation of benign-appearing esophageal stenosis.  Other findings included a 2 cm hiatal hernia.  Patient has been having epigastric pain again.    Chief complaint :         Procedure risk assessment:  No history of CHF.  No supplemental 02 use at home.  Not a known difficult airway Anticoagulant:     Previous GI Endoscopies / Labs / Imaging   **May not include all endoscopic evaluations         Latest Ref Rng & Units 03/19/2022    4:25 PM 11/06/2021    4:22 PM 08/26/2021    9:05 AM  Hepatic Function  Total Protein 6.5 - 8.1 g/dL 7.8  6.8  6.7   Albumin 3.5 - 5.0 g/dL 3.7  3.5  3.9   AST 15 - 41 U/L 23  18  13    ALT 0 - 44 U/L 11  10  7    Alk Phosphatase 38 - 126 U/L 73  69  76   Total Bilirubin 0.3 - 1.2 mg/dL 0.7  0.5  0.4        Latest Ref Rng & Units 09/08/2022    9:53 AM 03/19/2022    4:25 PM 01/21/2022   11:58 AM  CBC  WBC 4.0 - 10.5 K/uL 9.6  12.0  10.7   Hemoglobin 12.0 - 15.0 g/dL 02.7  25.3  66.4   Hematocrit 36.0 - 46.0 % 38.8  39.3  39.0   Platelets 150.0 - 400.0 K/uL 218.0  236  234      Past Medical History:  Diagnosis Date   Allergic rhinitis    Allergy    seasonal allergies   Anxiety    CKD (chronic kidney disease) 04/04/2017   COPD (chronic obstructive pulmonary disease) (HCC)    uses inhaler   Depression    Fatty liver    GERD (gastroesophageal reflux disease)    Gout    hx of   Hyperlipidemia    on meds   Hypertension    on meds   Myocardial infarction [facility])    Peptic ulcer 01/04/2003    Vertigo     Past Surgical History:  Procedure Laterality Date   ABDOMINAL HYSTERECTOMY     APPENDECTOMY     BIOPSY  08/13/2020   Procedure: BIOPSY;  Surgeon: Shellia Cleverly, DO;  Location: [facility]COPY;  Service: Gastroenterology;;   CORONARY PRESSURE/FFR STUDY N/A 01/27/2022   Procedure: INTRAVASCULAR PRESSURE WIRE/FFR STUDY;  Surgeon: Orbie Pyo, MD;  Location: [facility] INVASIVE CV LAB;  Service: Cardiovascular;  Laterality: N/A;   ESOPHAGOGASTRODUODENOSCOPY (EGD) WITH PROPOFOL N/A 08/13/2020   Procedure: ESOPHAGOGASTRODUODENOSCOPY (EGD) WITH PROPOFOL;  Surgeon: Shellia Cleverly, DO;  Location: [facility];  Service: Gastroenterology;  Laterality: N/A;   LEFT HEART CATH AND CORONARY ANGIOGRAPHY N/A 06/13/2017   Procedure: LEFT HEART CATH AND CORONARY ANGIOGRAPHY;  Surgeon: Marykay Lex, MD;  Location: [facility] INVASIVE CV LAB;  Service: Cardiovascular;  Laterality: N/A;   LEFT HEART CATH  AND CORONARY ANGIOGRAPHY N/A 01/27/2022   Procedure: LEFT HEART CATH AND CORONARY ANGIOGRAPHY;  Surgeon: Orbie Pyo, MD;  Location: [facility] INVASIVE CV LAB;  Service: Cardiovascular;  Laterality: N/A;   RIGHT/LEFT HEART CATH AND CORONARY ANGIOGRAPHY N/A 09/16/2017   Procedure: RIGHT/LEFT HEART CATH AND CORONARY ANGIOGRAPHY;  Surgeon: Swaziland, Peter M, MD;  Location: [facility] INVASIVE CV LAB;  Service: Cardiovascular;  Laterality: N/A;   SAVORY DILATION N/A 08/13/2020   Procedure: SAVORY DILATION;  Surgeon: Shellia Cleverly, DO;  Location: [facility];  Service: Gastroenterology;  Laterality: N/A;   ULTRASOUND GUIDANCE FOR VASCULAR ACCESS  09/16/2017   Procedure: Ultrasound Guidance For Vascular Access;  Surgeon: Swaziland, Peter M, MD;  Location: [facility] INVASIVE CV LAB;  Service: Cardiovascular;;   WISDOM TOOTH EXTRACTION      Family History  Problem Relation Age of Onset   Breast cancer Mother    Stroke Father    Stomach cancer Neg Hx    Colon cancer Neg Hx    Pancreatic cancer Neg Hx    Esophageal cancer  Neg Hx    Colon polyps Neg Hx    Rectal cancer Neg Hx     Current Medications, Allergies, Family History and Social History were reviewed in [employer] record.     Current Outpatient Medications  Medication Sig Dispense Refill   albuterol (PROVENTIL) (2.5 MG/3ML) 0.083% nebulizer solution USE 1 VIAL IN NEBULIZER EVERY 6 HOURS AS NEEDED FOR WHEEZING AND FOR SHORTNESS OF BREATH 150 mL 0   albuterol (VENTOLIN HFA) 108 (90 Base) MCG/ACT inhaler Inhale 2 puffs into the lungs every 6 (six) hours as needed for wheezing or shortness of breath. 6.7 g 2   allopurinol (ZYLOPRIM) 100 MG tablet Take 2 tablets (200 mg total) by mouth daily. 180 tablet 0   allopurinol (ZYLOPRIM) 100 MG tablet Take 2 tablets (200 mg total) by mouth daily. 180 tablet 0   aspirin 81 MG EC tablet Take 1 tablet (81 mg total) by mouth daily. Swallow whole. 30 tablet 12   atorvastatin (LIPITOR) 80 MG tablet Take 1 tablet (80 mg total) by mouth at bedtime. 90 tablet 0   atorvastatin (LIPITOR) 80 MG tablet Take 1 tablet (80 mg total) by mouth at bedtime. 90 tablet 0   benzonatate (TESSALON) 100 MG capsule Take 1 capsule (100 mg total) by mouth 3 (three) times daily as needed for cough. 21 capsule 0   Blood Glucose Monitoring Suppl (ACCU-CHEK GUIDE ME) w/Device KIT Use to test blood sugar in the morning, at noon, and at bedtime. 1 kit 0   clobetasol ointment (TEMOVATE) 0.05 % Apply 1 Application topically 2 (two) times daily. For up to 2 weeks. 30 g 0   colchicine 0.6 MG tablet Take 0.6 mg by mouth daily as needed (gout).     diazepam (VALIUM) 10 MG tablet Take 1 tablet (10 mg total) by mouth every 12 (twelve) hours as needed for anxiety. 60 tablet 0   dicyclomine (BENTYL) 10 MG capsule Take 1 capsule (10 mg total) by mouth in the morning, at noon, in the evening, and at bedtime. 360 capsule 1   escitalopram (LEXAPRO) 20 MG tablet Take 1 tablet (20 mg total) by mouth every morning. 90 tablet 0   ezetimibe  (ZETIA) 10 MG tablet Take 1 tablet (10 mg total) by mouth every morning. 90 tablet 3   famotidine (PEPCID) 20 MG tablet Take 1 tablet (20 mg total) by mouth 2 (two) times daily at 8  am and 10 pm. 180 tablet 0   famotidine (PEPCID) 20 MG tablet Take 1 tablet (20 mg total) by mouth 2 (two) times daily at 8 am and 10 pm. 180 tablet 0   isosorbide mononitrate (IMDUR) 60 MG 24 hr tablet Take 1 tablet (60 mg total) by mouth at bedtime. 30 tablet 0   levothyroxine (SYNTHROID) 25 MCG tablet Take 1 tablet (25 mcg total) by mouth before breakfast. 90 tablet 1   lisinopril (ZESTRIL) 2.5 MG tablet Take 1 tablet (2.5 mg total) by mouth daily at bedtime. 90 tablet 3   meclizine (ANTIVERT) 25 MG tablet Take 1 tablet (25 mg total) by mouth 3 (three) times daily as needed for dizziness. 30 tablet 0   metoprolol succinate (TOPROL-XL) 25 MG 24 hr tablet Take 0.5 tablets (12.5 mg total) by mouth every evening. 45 tablet 3   nitroGLYCERIN (NITROSTAT) 0.4 MG SL tablet Place 1 tablet (0.4 mg total) under the tongue every 5 (five) minutes as needed for chest pain. 45 tablet 3   nystatin (MYCOSTATIN/NYSTOP) powder Apply 1 Application topically to the affected area(s) 2 (two) times daily. 60 g 4   pantoprazole (PROTONIX) 40 MG tablet Take 1 tablet (40 mg total) by mouth 2 (two) times daily 180 tablet 0   pantoprazole (PROTONIX) 40 MG tablet Take 1 tablet (40 mg total) by mouth 2 (two) times daily. 180 tablet 0   potassium chloride SA (KLOR-CON M) 20 MEQ tablet Take 1 tablet (20 mEq total) by mouth daily at 12 noon. 90 tablet 0   Probiotic Product (PROBIOTIC DAILY) CAPS Take 1 tablet by mouth daily. 90 capsule 0   Respiratory Therapy Supplies (NEBULIZER/TUBING/MOUTHPIECE) KIT Use as directed 1 kit 0   sucralfate (CARAFATE) 1 g tablet Take 1 tablet (1 g total) by mouth 3 (three) times daily before meals. 90 tablet 0   valACYclovir (VALTREX) 500 MG tablet Take 1 tablet (500 mg total) by mouth every other day. 45 tablet 1    valACYclovir (VALTREX) 500 MG tablet Take 1 tablet (500 mg total) by mouth every other day. 45 tablet 1   No current facility-administered medications for this visit.    Review of Systems: No chest pain. No shortness of breath. No urinary complaints.    Physical Exam  Wt Readings from Last 3 Encounters:  11/17/22 201 lb (91.2 kg)  11/01/22 202 lb (91.6 kg)  10/22/22 196 lb 3.2 oz (89 kg)    There were no vitals taken for this visit. Constitutional:  Pleasant, generally well appearing ***female in no acute distress. Psychiatric: Normal mood and affect. Behavior is normal. EENT: Pupils normal.  Conjunctivae are normal. No scleral icterus. Neck supple.  Cardiovascular: Normal rate, regular rhythm.  Pulmonary/chest: Effort normal and breath sounds normal. No wheezing, rales or rhonchi. Abdominal: Soft, nondistended, nontender. Bowel sounds active throughout. There are no masses palpable. No hepatomegaly. Neurological: Alert and oriented to person place and time.  Extremities: *** edema  Willette Cluster, NP  12/08/2022, 8:19 AM  Cc:  [name], NP

## 2022-12-08 NOTE — Telephone Encounter (Signed)
Per patient she has appointment with eye doctor next week.

## 2022-12-09 ENCOUNTER — Other Ambulatory Visit: Payer: Medicare PPO | Admitting: Pharmacist

## 2022-12-09 NOTE — Progress Notes (Signed)
   12/09/2022 Name: Veronica Wolf MRN: [id number] DOB: 16-Dec-1940  Chief Complaint  Patient presents with   Medication Management    Follow up    Veronica Wolf is a 82 y.o. year old female who presented for a phone visit.     They were referred to the pharmacist by their PCP for assistance in managing medication access and complex medication management.   Subjective:  Care Team: Primary Care Provider: [name], NP ; Next Scheduled Visit: not currently scheduled Cardiologist: Dr Servando Salina; Next Scheduled Visit: not currently scheduled Gastroenterologist: Wilmon Pali; Next Scheduled Visit: 02/24/2023 (was late for appointment 9/4 so had to be rescheduled)   Medication Management / Adherence:  Current adherence strategy: using adherence packaging; [facility] from [facility] Current Pharmacy:   [facility] [address] [place] [address] Phone: [phone] Fax: [phone]  Patient reports affordability concerns with their medications: No  Patient reports access/transportation concerns to their pharmacy: Yes  Patient reports adherence concerns with their medications:  Yes  - using adherence packaging to help with adherence.   Patient started her packaging last week. Patient had called Melissa O'Sullivan's CMA, Windell Moulding 12/07/2022 and was seemingly confused about taking 2 packages in the morning.   She endorses that she did receive bottle of isosorbide mononitrate and is taking at night with her other non packaged medications - half of metoprolol and prn diazepam.   Patient reports Fair adherence to medications  Patient reports the following barriers to adherence:  Multiple comorbidities Complex medication regimen Lack of transportation   Objective:  Lab Results  Component Value Date   HGBA1C 6.7 (H) 11/17/2022    Lab Results  Component Value Date   CREATININE 1.22 (H) 11/17/2022   BUN 13  11/17/2022   NA 139 11/17/2022   K 4.2 11/17/2022   CL 105 11/17/2022   CO2 26 11/17/2022    Lab Results  Component Value Date   CHOL 134 07/26/2022   HDL 43.00 07/26/2022   LDLCALC 62 07/26/2022   TRIG 148.0 07/26/2022   CHOLHDL 3 07/26/2022    Medications Reviewed Today   Medications were not reviewed in this encounter       Assessment/Plan:   Medication Management: - Reviewed medications with patient - both how to take packaged meds and those that are in bottles.  - Her next package start date should be the evening of 01/03/2023 or if pharmacy cannot do that, then have a full day for 9/30 and have her remove the morning / noon packs.  -Patient requested if possible in future to have only 1 package at breakfast, 1 package at noon and 1 package at night but due to each package limited to just 5 medications, we might not be able to do this. Will work with pharmacy to see what can bo done.    Follow Up Plan: 1 to 2 weeks.   [name], PharmD Clinical Pharmacist [facility]

## 2022-12-09 NOTE — Telephone Encounter (Signed)
Called patient back she had question about taking metoprolol, diazepam and isosorbide together at bedtime - OK to take these at bedtime.

## 2022-12-09 NOTE — Telephone Encounter (Signed)
Pt called to request a call back from the pharmacist. She said it is very important but did not elaborate on why.

## 2022-12-10 ENCOUNTER — Other Ambulatory Visit: Payer: Self-pay

## 2022-12-10 ENCOUNTER — Other Ambulatory Visit: Payer: Medicare PPO | Admitting: Pharmacist

## 2022-12-10 ENCOUNTER — Telehealth: Payer: Self-pay | Admitting: Family

## 2022-12-10 NOTE — Telephone Encounter (Signed)
Patient states that some of her packaged meds have fallen out of her packages. Wonder if she has opened a packet and dropped them or when she pulled off packet she accidentally tore open the adjacent packet.  Patient states that she is very confused with the new packing system. It sounds like she might have not left packets on the roll as she reports the days are all mixed up and she cannot find the packets for 12/10/2022 but she has a packet for 12/09/2022.  Will go out patient's home to review.Marland Kitchen

## 2022-12-10 NOTE — Telephone Encounter (Signed)
Pt asked if Tammy could call her as soon as possible. States her the dates on her med list is confusing her and. She states it is too much for her to keep up.

## 2022-12-11 ENCOUNTER — Other Ambulatory Visit: Payer: Self-pay

## 2022-12-12 NOTE — Progress Notes (Signed)
   12/12/2022 Name: Veronica Wolf MRN: [id number] DOB: 1940-06-26  Chief Complaint  Patient presents with   Medication Management    CHANCHAL NIEHUES is a 82 y.o. year old female. Clinical pharmacist visited patient in her home today.    They were referred to the pharmacist by their PCP for assistance in managing medication access and complex medication management.   Subjective:  Care Team: Primary Care Provider: [name], NP ; Next Scheduled Visit: not currently scheduled Cardiologist: Dr Servando Salina; Next Scheduled Visit: not currently scheduled Gastroenterologist: Wilmon Pali; Next Scheduled Visit: 02/24/2023 (was late for appointment 9/4 so had to be rescheduled)   Medication Management / Adherence:  Current adherence strategy: using adherence packaging; Transitioning from [facility] to [facility]. Patient received her first 30 day adherence packaging which she was to start Saturday, August 31st.  Patient called office today stating she was confused about how to take her medications / use the packaging. .   When I reviewed her medication packages, it appears that the patient has torn off / removed packages up to 12/15/2022.  She has several loose packages for 09/6, 09/07, 09/08, 09/09 and 09/10 She also reported that "tablets are falling out of the packages"   Current Pharmacy:   [facility] [address] [place] [address] Phone: [phone] Fax: [phone]  Patient reports affordability concerns with their medications: No  Patient reports access/transportation concerns to their pharmacy: Yes  Patient reports adherence concerns with their medications:  Yes  - using adherence packaging to help with adherence.   Patient reports Fair adherence to medications  Patient reports the following barriers to adherence:  Multiple comorbidities Complex medication regimen Lack of transportation   Objective:  Lab  Results  Component Value Date   HGBA1C 6.7 (H) 11/17/2022    Lab Results  Component Value Date   CREATININE 1.22 (H) 11/17/2022   BUN 13 11/17/2022   NA 139 11/17/2022   K 4.2 11/17/2022   CL 105 11/17/2022   CO2 26 11/17/2022    Lab Results  Component Value Date   CHOL 134 07/26/2022   HDL 43.00 07/26/2022   LDLCALC 62 07/26/2022   TRIG 148.0 07/26/2022   CHOLHDL 3 07/26/2022    Medications Reviewed Today   Medications were not reviewed in this encounter       Assessment/Plan:   Medication Management: - Reviewed medications with patient - both how to take packaged meds and those that are in bottles.  - since she has several packages that were loose - I used a pill box for her to use 09/06 thru 09/10. Starting on 12/15/2022, she will then start using adherence packaging box. Written instructions about this were provided to patient.  - Also reviewed how to take her bedtime medications - metoprolol 25mg  0.5 tablet and isosorbide. Isosorbide will be included with next month's packaging.  Also working with pharmacy to see if next month we can use fewer packages per day.     Follow Up Plan: 7 to 10 days  [name], PharmD Clinical Pharmacist [facility]

## 2022-12-13 ENCOUNTER — Telehealth: Payer: Self-pay | Admitting: Pharmacist

## 2022-12-13 NOTE — Telephone Encounter (Signed)
Attempted to call patient back  Line busy

## 2022-12-13 NOTE — Telephone Encounter (Signed)
Pt called to get clarification on her medication instructions. Pt would like a call back when possible. Please Advise.

## 2022-12-13 NOTE — Telephone Encounter (Signed)
Patient called back with question about the pink pill - sucralfate. She just wanted to make sur ethat she should take sucralate with evening meal or if she needed to take it out of any other of her scheduled doses this week.  She is to resume taking sucralfate with each meal.

## 2022-12-13 NOTE — Telephone Encounter (Signed)
Patient called. She states she does not understand the weekly pill box that I filled for her to use Monday thru Tuesday until she can restarts using adherence packaging Wednesday 12/15/2022. She was not sure if she should about taking all 6 tablets in the slot for morning medications. Reassured patient that she should take take her morning medications that she missed now along with just her potassium supplement at noon. (She was instructed to remove sucralfate from her morning medications because it is also in her noon medications).

## 2022-12-14 ENCOUNTER — Telehealth: Payer: Self-pay | Admitting: Family

## 2022-12-14 DIAGNOSIS — R1013 Epigastric pain: Secondary | ICD-10-CM

## 2022-12-14 NOTE — Telephone Encounter (Signed)
Pt said she was 2 minutes late for her appt with GI and they scheduled her for her the next available which is in November. Pt wants to know if [name] can refer her somewhere else because she is in pain and cannot wait that long.

## 2022-12-14 NOTE — Addendum Note (Signed)
Addended by: [name] on: 12/14/2022 03:24 PM   Modules accepted: Orders

## 2022-12-14 NOTE — Telephone Encounter (Signed)
I have placed a referral to [facility]- I am not sure how far they are booking out though.

## 2022-12-14 NOTE — Telephone Encounter (Signed)
Patient notified of referral

## 2022-12-16 ENCOUNTER — Other Ambulatory Visit (HOSPITAL_COMMUNITY): Payer: Self-pay

## 2022-12-16 ENCOUNTER — Telehealth: Payer: Self-pay | Admitting: Family

## 2022-12-16 MED ORDER — SUCRALFATE 1 G PO TABS
1.0000 g | ORAL_TABLET | Freq: Three times a day (TID) | ORAL | 2 refills | Status: DC
  Filled 2022-12-16 – 2022-12-29 (×3): qty 90, 30d supply, fill #0
  Filled 2023-01-24: qty 90, 30d supply, fill #1
  Filled 2023-02-23 (×2): qty 90, 30d supply, fill #2

## 2022-12-16 NOTE — Telephone Encounter (Signed)
Patient would like a med refill on sucralfate (CARAFATE) 1 g tablet . Please call with further information.

## 2022-12-17 ENCOUNTER — Other Ambulatory Visit (HOSPITAL_BASED_OUTPATIENT_CLINIC_OR_DEPARTMENT_OTHER): Payer: Self-pay

## 2022-12-20 ENCOUNTER — Ambulatory Visit (INDEPENDENT_AMBULATORY_CARE_PROVIDER_SITE_OTHER): Payer: Medicare PPO | Admitting: Pharmacist

## 2022-12-20 DIAGNOSIS — Z79899 Other long term (current) drug therapy: Secondary | ICD-10-CM

## 2022-12-20 NOTE — Progress Notes (Signed)
12/20/2022 Name: Veronica Wolf MRN: [id number] DOB: 1941-01-24  Chief Complaint  Patient presents with   Medication Management    Veronica Wolf is a 82 y.o. year old female. Phone visit was conducted with patient today.    They were referred to the pharmacist by their PCP for assistance in managing medication access and complex medication management.   Subjective: Today patient reports she is still having some nausea and stomach pain. Denies vomiting. She states stomach pain improves when she eats.  She asked for refill last week for sucralfate but she is getting sucralfate 3 times a day in her adherence packaging.  Patient states today that she will have appointment with [facility] on [address] next week.    Care Team: Primary Care Provider: [name], NP ; Next Scheduled Visit: not currently scheduled Cardiologist: Dr Servando Salina; Next Scheduled Visit: not currently scheduled Gastroenterologist: Wilmon Pali; Next Scheduled Visit: 02/24/2023 (was late for appointment 9/4 so had to be rescheduled). There has been a new referral for GI with [facility] and patient is suppose to see them next week per patient.    Medication Management / Adherence:  Current adherence strategy: using adherence packaging; Transitioning from [facility] to [facility]. Patient received her first 30 day adherence packaging which she started Saturday, August 31st. The transition at first was a little rocky but patient reports today that she has been using packaging over the last week and repeat back to me that she has 2 packages in the morning, 1 at noon and 2 in the evening which is correct.    Current Pharmacy:   [facility] [address] [place] [address] Phone: [phone] Fax: [phone]  Patient reports affordability concerns with their medications: No  Patient reports access/transportation concerns to  their pharmacy: Yes  Patient reports adherence concerns with their medications:  Yes  - using adherence packaging  Patient reports Fair adherence to medications  Patient reports the following barriers to adherence:  Multiple comorbidities Complex medication regimen Lack of transportation   Objective:  Lab Results  Component Value Date   HGBA1C 6.7 (H) 11/17/2022    Lab Results  Component Value Date   CREATININE 1.22 (H) 11/17/2022   BUN 13 11/17/2022   NA 139 11/17/2022   K 4.2 11/17/2022   CL 105 11/17/2022   CO2 26 11/17/2022    Lab Results  Component Value Date   CHOL 134 07/26/2022   HDL 43.00 07/26/2022   LDLCALC 62 07/26/2022   TRIG 148.0 07/26/2022   CHOLHDL 3 07/26/2022   BP Readings from Last 3 Encounters:  11/17/22 115/60  11/01/22 130/74  10/22/22 134/68    Medications Reviewed Today     Reviewed by [name], RPH-CPP (Pharmacist) on 12/20/22 at 1057  Med List Status: <None>   Medication Order Taking? Sig Documenting Provider Last Dose Status Informant  albuterol (PROVENTIL) (2.5 MG/3ML) 0.083% nebulizer solution [id number]  USE 1 VIAL IN NEBULIZER EVERY 6 HOURS AS NEEDED FOR WHEEZING AND FOR SHORTNESS OF Garnet Sierras, Efraim Kaufmann, NP  Active   albuterol (VENTOLIN HFA) 108 (90 Base) MCG/ACT inhaler [id number]  Inhale 2 puffs into the lungs every 6 (six) hours as needed for wheezing or shortness of breath. [name], NP  Active   allopurinol (ZYLOPRIM) 100 MG tablet [id number] Yes Take 2 tablets (200 mg total) by mouth daily. [name], NP Taking Active   allopurinol (ZYLOPRIM)  100 MG tablet [id number]  Take 2 tablets (200 mg total) by mouth daily.   Active   aspirin 81 MG EC tablet [id number] Yes Take 1 tablet (81 mg total) by mouth daily. Swallow whole. Thomasene Ripple, DO Taking Active            Med Note Clydie Braun, Garey Alleva B   Wed Oct 07, 2021 11:19 AM)    atorvastatin (LIPITOR) 80 MG tablet [id number] Yes Take 1 tablet (80 mg total) by  mouth at bedtime. [name], NP Taking Active   atorvastatin (LIPITOR) 80 MG tablet [id number]  Take 1 tablet (80 mg total) by mouth at bedtime.   Active   benzonatate (TESSALON) 100 MG capsule [id number]  Take 1 capsule (100 mg total) by mouth 3 (three) times daily as needed for cough.   Active   Blood Glucose Monitoring Suppl (ACCU-CHEK GUIDE ME) w/Device KIT [id number]  Use to test blood sugar in the morning, at noon, and at bedtime. [name], NP  Active   clobetasol ointment (TEMOVATE) 0.05 % [id number]  Apply 1 Application topically 2 (two) times daily. For up to 2 weeks. [name], NP  Active   colchicine 0.6 MG tablet [id number] No Take 0.6 mg by mouth daily as needed (gout).  Patient not taking: Reported on 12/09/2022   [provider] Not Taking Active   diazepam (VALIUM) 10 MG tablet [id number] Yes Take 1 tablet (10 mg total) by mouth every 12 (twelve) hours as needed for anxiety. [name], NP Taking Active   dicyclomine (BENTYL) 10 MG capsule [id number] No Take 1 capsule (10 mg total) by mouth in the morning, at noon, in the evening, and at bedtime.  Patient not taking: Reported on 12/09/2022   [name], NP Not Taking Active   escitalopram (LEXAPRO) 20 MG tablet [id number] Yes Take 1 tablet (20 mg total) by mouth every morning. [name], NP Taking Active   ezetimibe (ZETIA) 10 MG tablet [id number] Yes Take 1 tablet (10 mg total) by mouth every morning. Thomasene Ripple, DO Taking Active   famotidine (PEPCID) 20 MG tablet [id number] Yes Take 1 tablet (20 mg total) by mouth 2 (two) times daily at 8 am and 10 pm. [name], NP Taking Active   famotidine (PEPCID) 20 MG tablet [id number]  Take 1 tablet (20 mg total) by mouth 2 (two) times daily at 8 am and 10 pm.   Active   isosorbide mononitrate (IMDUR) 60 MG 24 hr tablet [id number] Yes Take 1 tablet (60 mg total) by mouth at bedtime. [name], NP Taking Active    levothyroxine (SYNTHROID) 25 MCG tablet [id number] Yes Take 1 tablet (25 mcg total) by mouth before breakfast. Bradd Canary, MD Taking Active   lisinopril (ZESTRIL) 2.5 MG tablet [id number] Yes Take 1 tablet (2.5 mg total) by mouth daily at bedtime. Tobb, Lavona Mound, DO Taking Active   meclizine (ANTIVERT) 25 MG tablet [id number] Yes Take 1 tablet (25 mg total) by mouth 3 (three) times daily as needed for dizziness. [name], NP Taking Active   metoprolol succinate (TOPROL-XL) 25 MG 24 hr tablet [id number] Yes Take 0.5 tablets (12.5 mg total) by mouth every evening. Tobb, Kardie, DO Taking Active   nitroGLYCERIN (NITROSTAT) 0.4 MG SL tablet [id number]  Place 1 tablet (0.4 mg total) under the tongue every 5 (five) minutes as needed for chest pain. Tobb, Kardie, DO  Active   nystatin (MYCOSTATIN/NYSTOP) powder [id number]  Apply 1 Application topically to the affected area(s) 2 (two)  times daily. [name], NP  Active   pantoprazole (PROTONIX) 40 MG tablet [id number] Yes Take 1 tablet (40 mg total) by mouth 2 (two) times daily [name], NP Taking Active   pantoprazole (PROTONIX) 40 MG tablet [id number]  Take 1 tablet (40 mg total) by mouth 2 (two) times daily.   Active   potassium chloride SA (KLOR-CON M) 20 MEQ tablet [id number]  Take 1 tablet (20 mEq total) by mouth daily at 12 noon. [name], NP  Active   Probiotic Product (PROBIOTIC DAILY) CAPS [id number]  Take 1 tablet by mouth daily. Clayborne Dana, NP  Active   Respiratory Therapy Supplies (NEBULIZER/TUBING/MOUTHPIECE) Andria Rhein [id number]  Use as directed [name], NP  Active   sucralfate (CARAFATE) 1 g tablet [id number] Yes Take 1 tablet (1 g total) by mouth 3 (three) times daily before meals. [name], NP Taking Active   valACYclovir (VALTREX) 500 MG tablet [id number] No Take 1 tablet (500 mg total) by mouth every other day.  Patient not taking: Reported on 12/20/2022   [name], NP Not  Taking Active   valACYclovir (VALTREX) 500 MG tablet [id number]  Take 1 tablet (500 mg total) by mouth every other day.   Active               Assessment/Plan:   Medication Management: - Reviewed medications with patient. Reminded her that the larger, oblong pink tablet is her sucralfate.   - Continue to use adherence packaging - she will be due to refill at the end of September. Will coordinate with pharmacy for adherence packaging after she sees GI next week in case there are any changes.   Follow Up Plan: 7 to 10 days  [name], PharmD Clinical Pharmacist [facility] Primary [facility]

## 2022-12-28 ENCOUNTER — Other Ambulatory Visit (HOSPITAL_COMMUNITY): Payer: Self-pay

## 2022-12-28 ENCOUNTER — Other Ambulatory Visit: Payer: Medicare PPO | Admitting: Pharmacist

## 2022-12-28 ENCOUNTER — Other Ambulatory Visit: Payer: Self-pay

## 2022-12-28 MED FILL — Lisinopril Tab 2.5 MG: ORAL | 30 days supply | Qty: 30 | Fill #1 | Status: CN

## 2022-12-28 MED FILL — Ezetimibe Tab 10 MG: ORAL | 30 days supply | Qty: 30 | Fill #1 | Status: CN

## 2022-12-28 NOTE — Progress Notes (Signed)
12/28/2022 Name: Veronica Wolf MRN: [id number] DOB: 01-21-1941  Chief Complaint  Patient presents with   Medication Management    Veronica Wolf is a 82 y.o. year old female. Phone visit was conducted with patient today.    They were referred to the pharmacist by their PCP for assistance in managing medication access and complex medication management.   Subjective: Patient will be due to have adherence packaging refilled for October soon. Checking in today to verify medication regimen.   Today she also mentions that she had a episode of diarrhea last night which lasted about an hour. This is the only recent episode of diarrhea and has not continued today. She did not take any over-the-counter medications for diarrhea, just resolved on its own.    Care Team: Primary Care Provider: [name], NP ; Next Scheduled Visit: not currently scheduled Cardiologist: Dr Servando Salina; Next Scheduled Visit: not currently scheduled Gastroenterologist: Wilmon Pali; Next Scheduled Visit: 02/24/2023 (was late for appointment 9/4 so had to be rescheduled).     Medication Management / Adherence:  Current adherence strategy: using adherence packaging; Transitioned from [facility] to [facility] with first packaging received in September.  Started Saturday, August 31st.   Patient would like to have only 3 packages per day if possible - 1 in morning, 1 at noon and 1 in evening. However I think each package is limiting to about 5 medications due to space to print med name and strength on the package.    Current Pharmacy:   [facility] [address] [place] [address] Phone: [phone] Fax: [phone]  Patient reports affordability concerns with their medications: No  Patient reports access/transportation concerns to their pharmacy: Yes  Patient reports adherence concerns with their medications:  Yes  - using adherence packaging  Patient  reports Fair adherence to medications  Patient reports the following barriers to adherence:  Multiple comorbidities Complex medication regimen Lack of transportation   Objective:  Lab Results  Component Value Date   HGBA1C 6.7 (H) 11/17/2022    Lab Results  Component Value Date   CREATININE 1.22 (H) 11/17/2022   BUN 13 11/17/2022   NA 139 11/17/2022   K 4.2 11/17/2022   CL 105 11/17/2022   CO2 26 11/17/2022    Lab Results  Component Value Date   CHOL 134 07/26/2022   HDL 43.00 07/26/2022   LDLCALC 62 07/26/2022   TRIG 148.0 07/26/2022   CHOLHDL 3 07/26/2022   BP Readings from Last 3 Encounters:  11/17/22 115/60  11/01/22 130/74  10/22/22 134/68    Medications Reviewed Today     Reviewed by [name], RPH-CPP (Pharmacist) on 12/28/22 at 1133  Med List Status: <None>   Medication Order Taking? Sig Documenting Provider Last Dose Status Informant  albuterol (PROVENTIL) (2.5 MG/3ML) 0.083% nebulizer solution [id number] Yes USE 1 VIAL IN NEBULIZER EVERY 6 HOURS AS NEEDED FOR WHEEZING AND FOR SHORTNESS OF [place]et [place], Efraim Kaufmann, NP Taking Active   albuterol (VENTOLIN HFA) 108 (90 Base) MCG/ACT inhaler [id number] Yes Inhale 2 puffs into the lungs every 6 (six) hours as needed for wheezing or shortness of breath. [name], NP Taking Active   allopurinol (ZYLOPRIM) 100 MG tablet [id number] Yes Take 2 tablets (200 mg total) by mouth daily. [name], NP Taking Active   allopurinol (ZYLOPRIM) 100 MG tablet [id number] Yes Take 2 tablets (200 mg total) by mouth daily.  Taking Active   aspirin 81 MG  EC tablet [id number] Yes Take 1 tablet (81 mg total) by mouth daily. Swallow whole. Thomasene Ripple, DO Taking Active            Med Note Clydie Braun, Ronnette Rump B   Wed Oct 07, 2021 11:19 AM)    atorvastatin (LIPITOR) 80 MG tablet [id number] Yes Take 1 tablet (80 mg total) by mouth at bedtime. [name], NP Taking Active   atorvastatin (LIPITOR) 80 MG tablet  [id number] Yes Take 1 tablet (80 mg total) by mouth at bedtime.  Taking Active   benzonatate (TESSALON) 100 MG capsule [id number] No Take 1 capsule (100 mg total) by mouth 3 (three) times daily as needed for cough.  Patient not taking: Reported on 12/28/2022    Not Taking Active   Blood Glucose Monitoring Suppl (ACCU-CHEK GUIDE ME) w/Device KIT [id number] Yes Use to test blood sugar in the morning, at noon, and at bedtime. [name], NP Taking Active   clobetasol ointment (TEMOVATE) 0.05 % [id number] Yes Apply 1 Application topically 2 (two) times daily. For up to 2 weeks. [name], NP Taking Active   colchicine 0.6 MG tablet [id number] No Take 0.6 mg by mouth daily as needed (gout).  Patient not taking: Reported on 12/28/2022   [provider] Not Taking Active   diazepam (VALIUM) 10 MG tablet [id number] Yes Take 1 tablet (10 mg total) by mouth every 12 (twelve) hours as needed for anxiety. [name], NP Taking Active   dicyclomine (BENTYL) 10 MG capsule [id number] No Take 1 capsule (10 mg total) by mouth in the morning, at noon, in the evening, and at bedtime.  Patient not taking: Reported on 12/28/2022   [name], NP Not Taking Active   escitalopram (LEXAPRO) 20 MG tablet [id number] Yes Take 1 tablet (20 mg total) by mouth every morning. [name], NP Taking Active   ezetimibe (ZETIA) 10 MG tablet [id number] Yes Take 1 tablet (10 mg total) by mouth every morning. Thomasene Ripple, DO Taking Active   famotidine (PEPCID) 20 MG tablet [id number] Yes Take 1 tablet (20 mg total) by mouth 2 (two) times daily at 8 am and 10 pm. [name], NP Taking Active   famotidine (PEPCID) 20 MG tablet [id number] Yes Take 1 tablet (20 mg total) by mouth 2 (two) times daily at 8 am and 10 pm.  Taking Active   isosorbide mononitrate (IMDUR) 60 MG 24 hr tablet [id number] Yes Take 1 tablet (60 mg total) by mouth at bedtime. [name], NP Taking Active    levothyroxine (SYNTHROID) 25 MCG tablet [id number] Yes Take 1 tablet (25 mcg total) by mouth before breakfast. Bradd Canary, MD Taking Active   lisinopril (ZESTRIL) 2.5 MG tablet [id number] Yes Take 1 tablet (2.5 mg total) by mouth daily at bedtime. Tobb, Lavona Mound, DO Taking Active   meclizine (ANTIVERT) 25 MG tablet [id number] Yes Take 1 tablet (25 mg total) by mouth 3 (three) times daily as needed for dizziness. [name], NP Taking Active   metoprolol succinate (TOPROL-XL) 25 MG 24 hr tablet [id number] Yes Take 0.5 tablets (12.5 mg total) by mouth every evening. Tobb, Kardie, DO Taking Active   nitroGLYCERIN (NITROSTAT) 0.4 MG SL tablet [id number] No Place 1 tablet (0.4 mg total) under the tongue every 5 (five) minutes as needed for chest pain.  Patient not taking: Reported on 12/28/2022   Thomasene Ripple, DO Not Taking Active   nystatin (MYCOSTATIN/NYSTOP) powder [id number] Yes Apply 1 Application topically to the affected area(s) 2 (two) times daily.  [name], NP Taking Active   pantoprazole (PROTONIX) 40 MG tablet [id number] Yes Take 1 tablet (40 mg total) by mouth 2 (two) times daily [name], NP Taking Active   pantoprazole (PROTONIX) 40 MG tablet [id number] Yes Take 1 tablet (40 mg total) by mouth 2 (two) times daily.  Taking Active   potassium chloride SA (KLOR-CON M) 20 MEQ tablet [id number] Yes Take 1 tablet (20 mEq total) by mouth daily at 12 noon. [name], NP Taking Active   Probiotic Product (PROBIOTIC DAILY) CAPS [id number] No Take 1 tablet by mouth daily.  Patient not taking: Reported on 12/28/2022   Clayborne Dana, NP Not Taking Active   Respiratory Therapy Supplies (NEBULIZER/TUBING/MOUTHPIECE) KIT [id number] Yes Use as directed [name], NP Taking Active   sucralfate (CARAFATE) 1 g tablet [id number] Yes Take 1 tablet (1 g total) by mouth 3 (three) times daily before meals. [name], NP Taking Active   valACYclovir (VALTREX) 500  MG tablet [id number] Yes Take 1 tablet (500 mg total) by mouth every other day. [name], NP Taking Active   valACYclovir (VALTREX) 500 MG tablet [id number] Yes Take 1 tablet (500 mg total) by mouth every other day.  Taking Active               Assessment/Plan:   Medication Management: - Reviewed medications with patient and discussed possible packaging regimen changes.   Morning: levothyroxine, sucralfate, allopurinol, pantoprazole, famotidine, escitalopram  Noon: sucralfate, potassium, ezetimibe, lisinopril, valacyclovir (every OTHER day)  Evening: sucralfate, famotidine, pantoprazole, isosorbide, atorvastatin (would like lisinopril in the evening if can have 6 per pack but if not, then change to take in the morning - she is less confused about having 2 packages in the morning than if she has 2 packs in the morning AND 2 packs in the evening)   Bedtime: Patient will continue to get diazepam, metoprolol and aspirin in bottles and take prior to bedtime.   - Forwarded message to [employer] about refills for packaging and requested refills for diazepam and metoprolol.  Patient will be due to start packaging 01/03/2023 with evening package   Follow Up Plan: 1 week to make sure she has no questions about October packaging.   [name], PharmD Clinical Pharmacist [place] [facility]

## 2022-12-29 ENCOUNTER — Other Ambulatory Visit: Payer: Self-pay | Admitting: Family

## 2022-12-29 ENCOUNTER — Other Ambulatory Visit: Payer: Self-pay

## 2022-12-29 DIAGNOSIS — R42 Dizziness and giddiness: Secondary | ICD-10-CM

## 2022-12-29 MED FILL — Metoprolol Succinate Tab ER 24HR 25 MG (Tartrate Equiv): ORAL | 30 days supply | Qty: 15 | Fill #1 | Status: CN

## 2022-12-29 MED FILL — Metoprolol Succinate Tab ER 24HR 25 MG (Tartrate Equiv): ORAL | 30 days supply | Qty: 15 | Fill #1 | Status: AC

## 2022-12-29 MED FILL — Lisinopril Tab 2.5 MG: ORAL | 30 days supply | Qty: 30 | Fill #1 | Status: CN

## 2022-12-29 MED FILL — Ezetimibe Tab 10 MG: ORAL | 30 days supply | Qty: 30 | Fill #1 | Status: CN

## 2022-12-30 ENCOUNTER — Other Ambulatory Visit: Payer: Self-pay

## 2022-12-30 ENCOUNTER — Other Ambulatory Visit: Payer: Self-pay | Admitting: Family

## 2022-12-30 ENCOUNTER — Other Ambulatory Visit (HOSPITAL_COMMUNITY): Payer: Self-pay

## 2022-12-30 ENCOUNTER — Telehealth: Payer: Self-pay | Admitting: Family

## 2022-12-30 ENCOUNTER — Other Ambulatory Visit: Payer: Self-pay | Admitting: Pharmacist

## 2022-12-30 MED ORDER — ESCITALOPRAM OXALATE 20 MG PO TABS
20.0000 mg | ORAL_TABLET | Freq: Every morning | ORAL | 0 refills | Status: DC
  Filled 2022-12-30: qty 30, 30d supply, fill #0
  Filled 2023-01-24: qty 30, 30d supply, fill #1
  Filled 2023-02-23 (×2): qty 30, 30d supply, fill #2

## 2022-12-30 MED ORDER — MECLIZINE HCL 25 MG PO TABS
25.0000 mg | ORAL_TABLET | Freq: Three times a day (TID) | ORAL | 0 refills | Status: AC | PRN
  Filled 2022-12-30 – 2023-03-08 (×4): qty 90, 30d supply, fill #0

## 2022-12-30 MED ORDER — LISINOPRIL 2.5 MG PO TABS
2.5000 mg | ORAL_TABLET | Freq: Every day | ORAL | 2 refills | Status: DC
  Filled 2022-12-30: qty 30, 30d supply, fill #0
  Filled 2023-01-24: qty 30, 30d supply, fill #1
  Filled 2023-02-23 (×2): qty 30, 30d supply, fill #2

## 2022-12-30 MED ORDER — ISOSORBIDE MONONITRATE ER 60 MG PO TB24
60.0000 mg | ORAL_TABLET | Freq: Every day | ORAL | 0 refills | Status: DC
  Filled 2022-12-30: qty 30, 30d supply, fill #0
  Filled 2023-01-24: qty 30, 30d supply, fill #1
  Filled 2023-02-23 (×2): qty 30, 30d supply, fill #2

## 2022-12-30 MED ORDER — EZETIMIBE 10 MG PO TABS
10.0000 mg | ORAL_TABLET | Freq: Every day | ORAL | 2 refills | Status: DC
  Filled 2022-12-30: qty 30, 30d supply, fill #0
  Filled 2023-01-24: qty 30, 30d supply, fill #1
  Filled 2023-02-23 (×2): qty 30, 30d supply, fill #2

## 2022-12-30 MED FILL — Nystatin Topical Powder 100000 Unit/GM: CUTANEOUS | 30 days supply | Qty: 60 | Fill #0 | Status: CN

## 2022-12-30 MED FILL — Famotidine Tab 20 MG: ORAL | 90 days supply | Qty: 180 | Fill #0 | Status: CN

## 2022-12-30 NOTE — Telephone Encounter (Signed)
Received message from [facility] Adherence Packaging department that each package can only include 5 medications.  They are also not able to included medications that are dosed every OTHER day.   Will use the following medication schedule to keep her packages as 1 package in the morning, noon and evening.   Morning: levothyroxine, sucralfate, allopurinol, pantoprazole, famotidine, escitalopram Noon: sucralfate, potassium, ezetimibe, lisinopril, escitalopram Evening: sucralfate, famotidine, pantoprazole, isosorbide, atorvastatin (would like lisinopril in the evening if can have 6 per pack but if not, then change to take in the morning - she is less confused about having 2 packages in the morning than if she has 2 packs in the morning AND 2 packs in the evening) She will also need diazepam, Aspirin 81mg , and metoprolol filled - she gets these in bottles and keeps them by her bed so she remembers to take at bedtime.  Sent updated prescriptions for lisinopril and escitalopram with updated dosing times.

## 2022-12-30 NOTE — Telephone Encounter (Signed)
Prescriptions sent

## 2022-12-30 NOTE — Telephone Encounter (Signed)
Veronica Wolf with [facility] called to advise that they sent request for 4 medications to be filled. They need those 4 approved so they can fill the other 11 medications she's on. Pt uses their home delivery service and gets her pills in a pill pouch. Please send prescriptions for Escitalopram, Famotidine, meclizine, imdur.

## 2022-12-31 ENCOUNTER — Other Ambulatory Visit: Payer: Self-pay

## 2022-12-31 ENCOUNTER — Other Ambulatory Visit (HOSPITAL_COMMUNITY): Payer: Self-pay

## 2023-01-04 ENCOUNTER — Telehealth: Payer: Self-pay | Admitting: Family

## 2023-01-04 ENCOUNTER — Other Ambulatory Visit: Payer: Medicare PPO | Admitting: Pharmacist

## 2023-01-04 MED ORDER — DIAZEPAM 10 MG PO TABS
10.0000 mg | ORAL_TABLET | Freq: Two times a day (BID) | ORAL | 0 refills | Status: DC | PRN
  Filled 2023-01-04: qty 60, 30d supply, fill #0

## 2023-01-04 NOTE — Progress Notes (Signed)
01/04/2023 Name: Veronica Wolf MRN: [id number] DOB: 1941/01/14  No chief complaint on file.   Veronica Wolf is a 82 y.o. year old female. Phone visit was conducted with patient today.    They were referred to the pharmacist by their PCP for assistance in managing medication access and complex medication management.   Subjective: Patient will be due to have adherence packaging refilled for October soon. Checking in today to verify medication regimen.   Today she also mentions that she had a episode of diarrhea last night which lasted about an hour. This is the only recent episode of diarrhea and has not continued today. She did not take any over-the-counter medications for diarrhea, just resolved on its own.    Care Team: Primary Care Provider: [name], NP ; Next Scheduled Visit: not currently scheduled Cardiologist: Dr Servando Salina; Next Scheduled Visit: not currently scheduled Gastroenterologist: Wilmon Pali; Next Scheduled Visit: 02/24/2023 (was late for appointment 9/4 so had to be rescheduled).     Medication Management / Adherence:  Current adherence strategy: using adherence packaging; Transitioned from [facility] to [facility] Today she endorses that she has received her adherence packaging for October.   She was to start yesterday 01/03/2023 but she did not start new packages yesterday.   The pharmacy team was able to schedule her medications so that she will only have 3 packages per day - 1 in morning, 1 at noon and 1 in evening.    Morning: levothyroxine, sucralfate, allopurinol, pantoprazole, famotidine Noon: sucralfate, potassium, ezetimibe, lisinopril, escitalopram Evening: sucralfate, famotidine, pantoprazole, isosorbide, atorvastatin     At bedtime she will take the following medications from bottles - diazepam (if needed for sleep / anxiety), metoprolol 25mg  - 0.5 tablet daily, aspirin 81mg  daily, valacyclovir - every OTHER day.   Current  Pharmacy:   [facility] [address] [place] [address] Phone: [phone] Fax: [phone]  Patient reports affordability concerns with their medications: No  Patient reports access/transportation concerns to their pharmacy: Yes  Patient reports adherence concerns with their medications:  Yes  - using adherence packaging  Patient reports Fair adherence to medications  Patient reports the following barriers to adherence:  Multiple comorbidities Complex medication regimen Lack of transportation  Pre Diabetes:  Diet: mentions that she is eating a lot of vegetables but when she gets bored she eat ice cream and chocolate.   Objective:  Lab Results  Component Value Date   HGBA1C 6.7 (H) 11/17/2022    Lab Results  Component Value Date   CREATININE 1.22 (H) 11/17/2022   BUN 13 11/17/2022   NA 139 11/17/2022   K 4.2 11/17/2022   CL 105 11/17/2022   CO2 26 11/17/2022    Lab Results  Component Value Date   CHOL 134 07/26/2022   HDL 43.00 07/26/2022   LDLCALC 62 07/26/2022   TRIG 148.0 07/26/2022   CHOLHDL 3 07/26/2022   BP Readings from Last 3 Encounters:  11/17/22 115/60  11/01/22 130/74  10/22/22 134/68    Medications Reviewed Today     Reviewed by [name], RPH-CPP (Pharmacist) on 01/04/23 at 1209  Med List Status: <None>   Medication Order Taking? Sig Documenting Provider Last Dose Status Informant  albuterol (PROVENTIL) (2.5 MG/3ML) 0.083% nebulizer solution [id number] Yes USE 1 VIAL IN NEBULIZER EVERY 6 HOURS AS NEEDED FOR WHEEZING AND FOR SHORTNESS OF [place], Efraim Kaufmann, NP Taking Active   albuterol (VENTOLIN HFA) 108 (90 Base)  MCG/ACT inhaler [id number] Yes Inhale 2 puffs into the lungs every 6 (six) hours as needed for wheezing or shortness of breath. [name], NP Taking Active   allopurinol (ZYLOPRIM) 100 MG tablet [id number] Yes Take 2 tablets (200 mg total) by mouth daily. [name],  NP Taking Active   aspirin 81 MG EC tablet [id number] Yes Take 1 tablet (81 mg total) by mouth daily. Swallow whole. Thomasene Ripple, DO Taking Active            Med Note Clydie Braun, Kyrie Bun B   Wed Oct 07, 2021 11:19 AM)    atorvastatin (LIPITOR) 80 MG tablet [id number] Yes Take 1 tablet (80 mg total) by mouth at bedtime.  Taking Active   benzonatate (TESSALON) 100 MG capsule [id number] No Take 1 capsule (100 mg total) by mouth 3 (three) times daily as needed for cough.  Patient not taking: Reported on 01/04/2023    Not Taking Active   Blood Glucose Monitoring Suppl (ACCU-CHEK GUIDE ME) w/Device KIT [id number] No Use to test blood sugar in the morning, at noon, and at bedtime.  Patient not taking: Reported on 01/04/2023   [name], NP Not Taking Active   clobetasol ointment (TEMOVATE) 0.05 % [id number] Yes Apply 1 Application topically 2 (two) times daily. For up to 2 weeks. [name], NP Taking Active   colchicine 0.6 MG tablet [id number] No Take 0.6 mg by mouth daily as needed (gout).  Patient not taking: Reported on 01/04/2023   [provider] Not Taking Active   diazepam (VALIUM) 10 MG tablet [id number] Yes Take 1 tablet (10 mg total) by mouth every 12 (twelve) hours as needed for anxiety. [name], NP Taking Active   dicyclomine (BENTYL) 10 MG capsule [id number] No Take 1 capsule (10 mg total) by mouth in the morning, at noon, in the evening, and at bedtime.  Patient not taking: Reported on 01/04/2023   [name], NP Not Taking Active   escitalopram (LEXAPRO) 20 MG tablet [id number] Yes Take 1 tablet (20 mg total) by mouth every morning. [name], NP Taking Active   ezetimibe (ZETIA) 10 MG tablet [id number] Yes Take 1 tablet (10 mg total) by mouth daily with lunch. Bradd Canary, MD Taking Active   famotidine (PEPCID) 20 MG tablet [id number] Yes Take 1 tablet (20 mg total) by mouth 2 (two) times daily. (8 am & 10 pm) [name], NP Taking  Active   isosorbide mononitrate (IMDUR) 60 MG 24 hr tablet [id number] Yes Take 1 tablet (60 mg total) by mouth at bedtime. [name], NP Taking Active   levothyroxine (SYNTHROID) 25 MCG tablet [id number] Yes Take 1 tablet (25 mcg total) by mouth before breakfast. Bradd Canary, MD Taking Active   lisinopril (ZESTRIL) 2.5 MG tablet [id number] Yes Take 1 tablet (2.5 mg total) by mouth daily with lunch. Bradd Canary, MD Taking Active   meclizine (ANTIVERT) 25 MG tablet [id number] Yes Take 1 tablet (25 mg total) by mouth 3 (three) times daily as needed for dizziness. [name], NP Taking Active   metoprolol succinate (TOPROL-XL) 25 MG 24 hr tablet [id number] Yes Take 0.5 tablets (12.5 mg total) by mouth every evening. Tobb, Kardie, DO Taking Active   nitroGLYCERIN (NITROSTAT) 0.4 MG SL tablet [id number] Yes Place 1 tablet (0.4 mg total) under the tongue every 5 (five) minutes as needed for chest pain. Thomasene Ripple, DO Taking Active   nystatin (MYCOSTATIN/NYSTOP) powder [id number] Yes Apply 1 Application topically to the affected area(s) 2 (  two) times daily. [name], NP Taking Active   potassium chloride SA (KLOR-CON M) 20 MEQ tablet [id number] Yes Take 1 tablet (20 mEq total) by mouth daily at 12 noon. [name], NP Taking Active   Probiotic Product (PROBIOTIC DAILY) CAPS [id number] No Take 1 tablet by mouth daily.  Patient not taking: Reported on 01/04/2023   Clayborne Dana, NP Not Taking Active   Respiratory Therapy Supplies (NEBULIZER/TUBING/MOUTHPIECE) KIT [id number]  Use as directed [name], NP  Active   sucralfate (CARAFATE) 1 g tablet [id number] Yes Take 1 tablet (1 g total) by mouth 3 (three) times daily before meals. [name], NP Taking Active   valACYclovir (VALTREX) 500 MG tablet [id number] Yes Take 1 tablet (500 mg total) by mouth every other day. [name], NP Taking Active   valACYclovir (VALTREX) 500 MG tablet [id number] Yes  Take 1 tablet (500 mg total) by mouth every other day.  Taking Active               Assessment/Plan:  Pre Diabetes:  Discussed limiting intake of sweets. Recommended smaller portions of things like ice cream (1/2 cup) and to try sugar free / low sugar options like sugar free jello or pudding  Medication Management: - Reviewed medications with patient and discussed changes in packaging from September to October. She is to removed the 3 packages from 01/03/23 from her new adherence box. She will start new box today.    Reviewed her bedtime medications which are in bottles. She will place on her bedside table. Diazepam (as needed), metoprolol - take 0.5 tablet daily, aspirin 81mg  daily, valacyclovir 500mg  - take every OTHER day.    Follow Up Plan: 3 weeks    [name], PharmD Clinical Pharmacist [facility]

## 2023-01-04 NOTE — Addendum Note (Signed)
Addended by: [name] on: 01/04/2023 08:31 PM   Modules accepted: Orders

## 2023-01-04 NOTE — Telephone Encounter (Signed)
Pt wanted to let Tammy know she received the albuterol but has not received ibuprofen.

## 2023-01-04 NOTE — Telephone Encounter (Signed)
Patient is requesting refill for diazepam. Will need approval from PCP.  Last refill was 11/29/2022 for diazepam #60 - take 1 tablet up to every 12 hours.   Patient doesn't have ibuprofen on her medication list. Reviewed with her and she was confused. She meant to ask for diazepam.   Will forward to PCP for review and for diazepam approval. Should be sent to [facility] for delivery.

## 2023-01-04 NOTE — Addendum Note (Signed)
Addended by: [name] on: 01/04/2023 08:30 PM   Modules accepted: Orders

## 2023-01-05 ENCOUNTER — Other Ambulatory Visit: Payer: Self-pay

## 2023-01-05 ENCOUNTER — Other Ambulatory Visit (HOSPITAL_COMMUNITY): Payer: Self-pay

## 2023-01-06 ENCOUNTER — Other Ambulatory Visit (HOSPITAL_COMMUNITY): Payer: Self-pay

## 2023-01-06 ENCOUNTER — Other Ambulatory Visit: Payer: Self-pay

## 2023-01-07 ENCOUNTER — Telehealth: Payer: Self-pay | Admitting: Pharmacist

## 2023-01-07 ENCOUNTER — Other Ambulatory Visit (HOSPITAL_COMMUNITY): Payer: Self-pay

## 2023-01-07 NOTE — Telephone Encounter (Signed)
[facility] sent a teams message that patient called and wanted to speak with me She received diazepam yesterday 10/3 and received valacyclovir today.  She also had questions about valacyclovir. She is worried about how to remember to take valacyclovir every other day. We discussed some ways to remember. She decided that writing down on a calendar when she takes valacyclovir.

## 2023-01-10 ENCOUNTER — Telehealth: Payer: Self-pay | Admitting: Family

## 2023-01-10 NOTE — Telephone Encounter (Signed)
Pt called asking if she could take the following medications in the afternoon if she had overslept:  Allopurinol Dicyclomine Escitalopram Levothyroxine Sucralfate  Pt advised that she has been sleeping in due to drowsiness from another medication and had woken up at 2:30p today and took them out of habit. Please Advise.

## 2023-01-11 NOTE — Telephone Encounter (Signed)
Patient called today. Discussed that she overslept yesterday after taking cold medication on Sunday night 01/09/2023. She took morning medications later yesterday which is OK except that she would have gotten 2 sucralfate at noon by taking the morning and noon packets together.  If if happens again that she wakes late and morning package is taken close to noon package, she should take 1 of her sucralfate out of the morning package (large pink tablet) and only take the sucralfate in her noon package.  Patient reports that she had taken Nyquil on 10/06. Discussed that taking Nyquil and diazepam at night could make her very sleepy.  Recommended if she needs to take Nyquil again, then hold diazepam. If she cannot get to sleep within an hour after Nyquil dose she can take a half tablet of diazepam.

## 2023-01-24 ENCOUNTER — Other Ambulatory Visit: Payer: Self-pay

## 2023-01-24 ENCOUNTER — Other Ambulatory Visit (HOSPITAL_COMMUNITY): Payer: Self-pay

## 2023-01-24 MED FILL — Famotidine Tab 20 MG: ORAL | 30 days supply | Qty: 60 | Fill #0 | Status: AC

## 2023-01-24 MED FILL — Metoprolol Succinate Tab ER 24HR 25 MG (Tartrate Equiv): ORAL | 30 days supply | Qty: 15 | Fill #2 | Status: AC

## 2023-01-25 ENCOUNTER — Other Ambulatory Visit: Payer: Self-pay

## 2023-01-25 ENCOUNTER — Other Ambulatory Visit (HOSPITAL_COMMUNITY): Payer: Self-pay

## 2023-01-25 ENCOUNTER — Other Ambulatory Visit: Payer: Self-pay | Admitting: Pharmacist

## 2023-01-25 NOTE — Progress Notes (Signed)
01/25/2023 Name: Veronica Wolf MRN: [id number] DOB: 1940-06-20  Chief Complaint  Patient presents with   Medication Management    Veronica Wolf is a 82 y.o. year old female. Phone visit was conducted with patient today.    They were referred to the pharmacist by their PCP for assistance in managing medication access and complex medication management.   Subjective: Patient will be due to have adherence packaging refilled soon. It looks like [facility] has started to fill maintenance  Checking in today to verify medication regimen and assess adherence.   Care Team: Primary Care Provider: [name], NP ; Next Scheduled Visit: not currently scheduled Cardiologist: Dr Servando Salina; Next Scheduled Visit: not currently scheduled Gastroenterologist: Wilmon Pali; Next Scheduled Visit: 02/24/2023 (was late for appointment 9/4 so had to be rescheduled).     Medication Management / Adherence:  Current adherence strategy: using adherence packaging; Transitioned from [facility] to [facility]   She states she is doing well with [employer]. She is also taking aspirin 81mg  daily at night, metoprolol ER 25mg  0.5 tablet at night, diazepam as needed and valacyclovir every other day. These 4 medications are not included in packaging but patient keeps by her bed to take prior to sleep.   The pharmacy team was able to schedule her medications with last packaging so that she will only have 3 packages per day - 1 in morning, 1 at noon and 1 in evening.    Morning: levothyroxine, sucralfate, allopurinol, pantoprazole, famotidine Noon: sucralfate, potassium, ezetimibe, lisinopril, escitalopram Evening: sucralfate, famotidine, pantoprazole, isosorbide, atorvastatin   Current Pharmacy:   [facility] [address] [place] [address] Phone: [phone] Fax: [phone]  Patient reports affordability concerns with their medications: No   Patient reports access/transportation concerns to their pharmacy: Yes  Patient reports adherence concerns with their medications:  Yes  - using adherence packaging  Patient reports Fair adherence to medications  Patient reports the following barriers to adherence:  Multiple comorbidities Complex medication regimen Lack of transportation  Pre Diabetes:  Diet: mentions that she is eating a lot of vegetables but when she gets bored she eat ice cream and chocolate.   Objective:  Lab Results  Component Value Date   HGBA1C 6.7 (H) 11/17/2022    Lab Results  Component Value Date   CREATININE 1.22 (H) 11/17/2022   BUN 13 11/17/2022   NA 139 11/17/2022   K 4.2 11/17/2022   CL 105 11/17/2022   CO2 26 11/17/2022    Lab Results  Component Value Date   CHOL 134 07/26/2022   HDL 43.00 07/26/2022   LDLCALC 62 07/26/2022   TRIG 148.0 07/26/2022   CHOLHDL 3 07/26/2022   BP Readings from Last 3 Encounters:  11/17/22 115/60  11/01/22 130/74  10/22/22 134/68    Medications Reviewed Today     Reviewed by [name], RPH-CPP (Pharmacist) on 01/25/23 at 1051  Med List Status: <None>   Medication Order Taking? Sig Documenting Provider Last Dose Status Informant  albuterol (PROVENTIL) (2.5 MG/3ML) 0.083% nebulizer solution [id number] No USE 1 VIAL IN NEBULIZER EVERY 6 HOURS AS NEEDED FOR WHEEZING AND FOR SHORTNESS OF BREATH  Patient not taking: Reported on 01/25/2023   [name], NP Not Taking Active   albuterol (VENTOLIN HFA) 108 (90 Base) MCG/ACT inhaler [id number] Yes Inhale 2 puffs into the lungs every 6 (six) hours as needed for wheezing or shortness of breath. [name], NP Taking Active  allopurinol (ZYLOPRIM) 100 MG tablet [id number] Yes Take 2 tablets (200 mg total) by mouth daily. [name], NP Taking Active   aspirin 81 MG EC tablet [id number] Yes Take 1 tablet (81 mg total) by mouth daily. Swallow whole. Thomasene Ripple, DO Taking Active             Med Note Clydie Braun, Diara Chaudhari B   Wed Oct 07, 2021 11:19 AM)    atorvastatin (LIPITOR) 80 MG tablet [id number] Yes Take 1 tablet (80 mg total) by mouth at bedtime.  Taking Active   Blood Glucose Monitoring Suppl (ACCU-CHEK GUIDE ME) w/Device KIT [id number]  Use to test blood sugar in the morning, at noon, and at bedtime. [name], NP  Active   clobetasol ointment (TEMOVATE) 0.05 % [id number]  Apply 1 Application topically 2 (two) times daily. For up to 2 weeks. [name], NP  Active   colchicine 0.6 MG tablet [id number]  Take 0.6 mg by mouth daily as needed (gout). [provider]  Active   diazepam (VALIUM) 10 MG tablet [id number] Yes Take 1 tablet (10 mg total) by mouth every 12 (twelve) hours as needed for anxiety. [name], NP Taking Active   dicyclomine (BENTYL) 10 MG capsule [id number] Yes Take 1 capsule (10 mg total) by mouth in the morning, at noon, in the evening, and at bedtime. [name], NP Taking Active   escitalopram (LEXAPRO) 20 MG tablet [id number] Yes Take 1 tablet (20 mg total) by mouth every morning. [name], NP Taking Active   ezetimibe (ZETIA) 10 MG tablet [id number] Yes Take 1 tablet (10 mg total) by mouth daily with lunch. Bradd Canary, MD Taking Active   famotidine (PEPCID) 20 MG tablet [id number] Yes Take 1 tablet (20 mg total) by mouth in the morning and at bedtime. (8 am & 10 pm) [name], NP Taking Active   isosorbide mononitrate (IMDUR) 60 MG 24 hr tablet [id number] Yes Take 1 tablet (60 mg total) by mouth at bedtime. [name], NP Taking Active   levothyroxine (SYNTHROID) 25 MCG tablet [id number] Yes Take 1 tablet (25 mcg total) by mouth before breakfast. Bradd Canary, MD Taking Active   lisinopril (ZESTRIL) 2.5 MG tablet [id number] Yes Take 1 tablet (2.5 mg total) by mouth daily with lunch. Bradd Canary, MD Taking Active   meclizine (ANTIVERT) 25 MG tablet [id number] Yes Take 1 tablet (25 mg total)  by mouth 3 (three) times daily as needed for dizziness. [name], NP Taking Active   metoprolol succinate (TOPROL-XL) 25 MG 24 hr tablet [id number] Yes Take 0.5 tablets (12.5 mg total) by mouth every evening. Tobb, Kardie, DO Taking Active   nitroGLYCERIN (NITROSTAT) 0.4 MG SL tablet [id number] Yes Place 1 tablet (0.4 mg total) under the tongue every 5 (five) minutes as needed for chest pain. Thomasene Ripple, DO Taking Active   nystatin (MYCOSTATIN/NYSTOP) powder [id number] Yes Apply 1 Application topically to the affected area(s) 2 (two) times daily. [name], NP Taking Active   potassium chloride SA (KLOR-CON M) 20 MEQ tablet [id number] Yes Take 1 tablet (20 mEq total) by mouth daily at 12 noon. [name], NP Taking Active   Probiotic Product (PROBIOTIC DAILY) CAPS [id number] Yes Take 1 tablet by mouth daily. Clayborne Dana, NP Taking Active   Respiratory Therapy Supplies (NEBULIZER/TUBING/MOUTHPIECE) Andria Rhein [id number] Yes Use as directed [name], NP Taking Active   sucralfate (CARAFATE) 1 g tablet [id number] Yes Take 1 tablet (1 g total) by mouth 3 (three) times  daily before meals. [name], NP Taking Active   valACYclovir (VALTREX) 500 MG tablet [id number]  Take 1 tablet (500 mg total) by mouth every other day. [name], NP  Active   valACYclovir (VALTREX) 500 MG tablet [id number] Yes Take 1 tablet (500 mg total) by mouth every other day.  Taking Active               Assessment/Plan:   Medication Management: - Reviewed medications with patient.  - Requested refill for metoprolol from [facility]. Patient will be due to refill diazepam and valacyclovir next week.       Follow Up Plan: 3 to 4 weeks    [name], PharmD Clinical Pharmacist [facility]

## 2023-02-01 ENCOUNTER — Other Ambulatory Visit (HOSPITAL_COMMUNITY): Payer: Self-pay

## 2023-02-01 ENCOUNTER — Telehealth: Payer: Self-pay | Admitting: Pharmacist

## 2023-02-01 ENCOUNTER — Telehealth: Payer: Self-pay | Admitting: Family

## 2023-02-01 MED ORDER — DIAZEPAM 10 MG PO TABS
10.0000 mg | ORAL_TABLET | Freq: Two times a day (BID) | ORAL | 0 refills | Status: DC | PRN
  Filled 2023-02-01 – 2023-02-02 (×2): qty 60, 30d supply, fill #0

## 2023-02-01 NOTE — Telephone Encounter (Signed)
Patient is requesting refill for diazepam.  Rx is filled by [facility] thru mail order service.    Dispenses   Dispensed Days Supply Quantity Pharmacy  diazepam (VALIUM) 10 MG tablet 01/05/2023 30 60 tablet [place] [facility]  diazepam (VALIUM) 10 MG tablet 12/03/2022 30 60 tablet [facility] -...  diazepam 10 mg tablet 10/27/2022 30 60 each [facility] - [facility]  diazepam 10 mg tablet 09/30/2022 30 60 each [facility] - [facility]  diazepam 10 mg tablet 09/01/2022 30 60 each [facility] - [facility]...  diazepam 10 mg tablet 07/30/2022 30 60 each [facility]

## 2023-02-01 NOTE — Telephone Encounter (Signed)
The only appointment that I see is on 11/21. Thanks.

## 2023-02-01 NOTE — Telephone Encounter (Signed)
Pt called and wanted to confirm the only GI appt she has is on 11/21. Stated she thought pcp had told her she had an appt on 11/6 and wanted pcp to confirm. Please advise and I will call pt and let her know.

## 2023-02-02 ENCOUNTER — Other Ambulatory Visit: Payer: Self-pay | Admitting: Family

## 2023-02-02 ENCOUNTER — Other Ambulatory Visit (HOSPITAL_COMMUNITY): Payer: Self-pay

## 2023-02-02 ENCOUNTER — Other Ambulatory Visit: Payer: Self-pay

## 2023-02-02 MED ORDER — POTASSIUM CHLORIDE CRYS ER 20 MEQ PO TBCR
20.0000 meq | EXTENDED_RELEASE_TABLET | Freq: Every day | ORAL | 0 refills | Status: DC
  Filled 2023-02-02: qty 90, 90d supply, fill #0
  Filled 2023-02-23 (×2): qty 30, 30d supply, fill #0
  Filled 2023-03-10 – 2023-03-24 (×2): qty 30, 30d supply, fill #1
  Filled 2023-04-14 – 2023-04-19 (×2): qty 30, 30d supply, fill #2

## 2023-02-02 NOTE — Telephone Encounter (Signed)
Pt and daughter advised.

## 2023-02-03 ENCOUNTER — Other Ambulatory Visit (HOSPITAL_COMMUNITY): Payer: Self-pay

## 2023-02-05 ENCOUNTER — Other Ambulatory Visit (HOSPITAL_COMMUNITY): Payer: Self-pay

## 2023-02-07 ENCOUNTER — Telehealth: Payer: Self-pay | Admitting: Family

## 2023-02-07 DIAGNOSIS — R7989 Other specified abnormal findings of blood chemistry: Secondary | ICD-10-CM

## 2023-02-07 NOTE — Telephone Encounter (Signed)
It looks like pt never completed the MRI I ordered previously to check her pituitary gland.    I am replacing order. Please notify pt to expect a call to schedule.

## 2023-02-07 NOTE — Telephone Encounter (Signed)
Called [facility]

## 2023-02-07 NOTE — Telephone Encounter (Signed)
Patient advised to call her insurance to find out which dentist takes her insurance. They will refer her for dentures. She verbalized understanding

## 2023-02-07 NOTE — Telephone Encounter (Signed)
Patient was notified of this information and to be aware of a call from radiology

## 2023-02-07 NOTE — Telephone Encounter (Signed)
Called patient's daughter Noreene Larsson but no answer and mail box was full

## 2023-02-07 NOTE — Telephone Encounter (Signed)
Pt states she needs new dentures and would like to know if pcp can recommend anywhere. She is having trouble finding somewhere with her ins. Please advise.

## 2023-02-14 ENCOUNTER — Other Ambulatory Visit: Payer: Self-pay

## 2023-02-16 ENCOUNTER — Other Ambulatory Visit: Payer: Self-pay

## 2023-02-23 ENCOUNTER — Other Ambulatory Visit (HOSPITAL_COMMUNITY): Payer: Self-pay

## 2023-02-23 ENCOUNTER — Other Ambulatory Visit: Payer: Self-pay

## 2023-02-23 MED FILL — Famotidine Tab 20 MG: ORAL | 30 days supply | Qty: 60 | Fill #1 | Status: AC

## 2023-02-23 MED FILL — Famotidine Tab 20 MG: ORAL | 30 days supply | Qty: 60 | Fill #1 | Status: CN

## 2023-02-24 ENCOUNTER — Other Ambulatory Visit: Payer: Medicare PPO

## 2023-02-24 ENCOUNTER — Other Ambulatory Visit (HOSPITAL_COMMUNITY): Payer: Self-pay

## 2023-02-24 ENCOUNTER — Encounter: Payer: Self-pay | Admitting: Nurse Practitioner

## 2023-02-24 ENCOUNTER — Other Ambulatory Visit: Payer: Self-pay

## 2023-02-24 ENCOUNTER — Other Ambulatory Visit (HOSPITAL_BASED_OUTPATIENT_CLINIC_OR_DEPARTMENT_OTHER): Payer: Self-pay

## 2023-02-24 ENCOUNTER — Ambulatory Visit: Payer: Medicare PPO | Admitting: Nurse Practitioner

## 2023-02-24 VITALS — BP 126/72 | HR 79 | Ht 64.0 in | Wt 197.1 lb

## 2023-02-24 DIAGNOSIS — K5900 Constipation, unspecified: Secondary | ICD-10-CM

## 2023-02-24 DIAGNOSIS — R1013 Epigastric pain: Secondary | ICD-10-CM | POA: Diagnosis not present

## 2023-02-24 LAB — COMPREHENSIVE METABOLIC PANEL
ALT: 7 U/L (ref 0–35)
AST: 14 U/L (ref 0–37)
Albumin: 4.1 g/dL (ref 3.5–5.2)
Alkaline Phosphatase: 79 U/L (ref 39–117)
BUN: 13 mg/dL (ref 6–23)
CO2: 28 meq/L (ref 19–32)
Calcium: 9.4 mg/dL (ref 8.4–10.5)
Chloride: 105 meq/L (ref 96–112)
Creatinine, Ser: 1.39 mg/dL — ABNORMAL HIGH (ref 0.40–1.20)
GFR: 35.05 mL/min — ABNORMAL LOW (ref >60.00)
Glucose, Bld: 102 mg/dL — ABNORMAL HIGH (ref 70–99)
Potassium: 4.1 meq/L (ref 3.5–5.1)
Sodium: 140 meq/L (ref 135–145)
Total Bilirubin: 0.3 mg/dL (ref 0.2–1.2)
Total Protein: 7.2 g/dL (ref 6.0–8.3)

## 2023-02-24 LAB — CBC
HCT: 39.8 % (ref 36.0–46.0)
Hemoglobin: 12.6 g/dL (ref 12.0–15.0)
MCHC: 31.8 g/dL (ref 30.0–36.0)
MCV: 85.1 fL (ref 78.0–100.0)
Platelets: 203 10*3/uL (ref 150.0–400.0)
RBC: 4.67 Mil/uL (ref 3.87–5.11)
RDW: 19.7 % — ABNORMAL HIGH (ref 11.5–15.5)
WBC: 11.1 10*3/uL — ABNORMAL HIGH (ref 4.0–10.5)

## 2023-02-24 LAB — LIPASE: Lipase: 31 U/L (ref 11.0–59.0)

## 2023-02-24 MED ORDER — DICYCLOMINE HCL 10 MG PO CAPS: 10.0000 mg | ORAL_CAPSULE | Freq: Two times a day (BID) | ORAL | 6 refills | Status: DC

## 2023-02-24 MED ORDER — SUCRALFATE 1 G PO TABS: 1.0000 g | ORAL_TABLET | Freq: Every day | ORAL | 0 refills | Status: DC

## 2023-02-24 MED ORDER — DICYCLOMINE HCL 10 MG PO CAPS: 10.0000 mg | ORAL_CAPSULE | Freq: Two times a day (BID) | ORAL | 5 refills | Status: DC

## 2023-02-24 MED FILL — Metoprolol Succinate Tab ER 24HR 25 MG (Tartrate Equiv): ORAL | 30 days supply | Qty: 15 | Fill #3 | Status: AC

## 2023-02-24 MED FILL — Metoprolol Succinate Tab ER 24HR 25 MG (Tartrate Equiv): ORAL | 30 days supply | Qty: 15 | Fill #3 | Status: CN

## 2023-02-24 NOTE — Patient Instructions (Addendum)
For upper abdominal pain:  I want you to take Dicyclomine 10 mg twice daily. Will send in prescription to your pharmacy for # 60 with 5 refills. I will refill Sucralfate 1 gram which I want you to take in the mornings , one hour before your other morning medications. I will give you a one month supply. This is not a medication I prefer you not take long term  For constipation:  Please buy Dulcolax 5 mg  and take 2 tablets today  Please buy Miralax and take 1 capful mixed in 8 ounces twice daily for the next 3 days.  Once constipation has improved you can take the miralax once daily as needed for recurrent constipation.   If still having problems moving bowels then you can add the Dulcolax 5mg   daily as needed.   Please let us know in a couple of weeks if you are not feeling better.   Your provider has requested that you go to the basement level for lab work before leaving today. Press "B" on the elevator. The lab is located at the first door on the left as you exit the elevator.   _______________________________________________________  If your blood pressure at your visit was 140/90 or greater, please contact your primary care physician to follow up on this.  _______________________________________________________  If you are age 83 or older, your body mass index should be between 23-30. Your Body mass index is 33.84 kg/m. If this is out of the aforementioned range listed, please consider follow up with your Primary Care Provider.  If you are age 74 or younger, your body mass index should be between 19-25. Your Body mass index is 33.84 kg/m. If this is out of the aformentioned range listed, please consider follow up with your Primary Care Provider.   ________________________________________________________  The [facility] providers would like to encourage you to use [facility] to communicate with providers for non-urgent requests or questions.  Due to long hold times on the telephone,  sending your provider a message by [facility] may be a faster and more efficient way to get a response.  Please allow 48 business hours for a response.  Please remember that this is for non-urgent requests.   It was a pleasure to see you today!  Thank you for trusting me with your gastrointestinal care!    Willette Cluster, NP

## 2023-02-24 NOTE — Progress Notes (Signed)
ASSESSMENT    Brief Narrative:  82 y.o.  female known to Dr. Barron Alvine with a past medical history not limited to  COPD, CKD, CAD with cardiac cath 2019, depression, HTN, HLD, allergic rhinitis, anxiety, GERD and chronic intermittent epigastric pain  Chronic, intermittent mid upper abdominal pain. Etiology unclear.  Difficult to obtain a good history from patient but best I can tell the pain hasn't changed in years.  Prior evaluation ( EGD, CT scan, [place]) unrevealing.  Dyspepsia?  Constipation contributing?  No radiation of pain to back but has mid back pain ( ? New pain).   History of esophageal stenosis s/p Savary dilation in 2022.  No recurrent dysphagia  Black stool. Likely 2/2 to Bismuth.  Anal vault empty on DRE. Gloved finger Hemoccult negative.   Constipation, possibly due in part to recent Carafate.   See PMH below for additional history  PLAN   --CBC, LFT, lipase --Sucralfate helps pain. Will refill Sucralfate 1 gm to take q am 1 hour before breakfast and morning medications.  Giving reduced dose of Carafate due to constipation and CKD ( GFR 41) --Continue BID Pepcid --Dicyclomine TID is on her home med list but best I can tell she is taking only once a day.  Will increase dose to twice daily being mindful of potential to cause worsening constipation.    HPI   Chief complaint : intermittent mid upper abdominal pain   GI History:  Patient was last seen here January 2022 , at that time for evaluation of GERD and epigastric pain. EGD remarkable for esophageal stenosis s/p Savary dilation. Remainder of exam was normal.   In Jan 2023 she had a CTAP with contrast for evaluation of sepsis - no acute findings  In Aug 2023 she had a non-contrast CTAP - no acute findings.     Interval History:  Patient is a limited historian. Daughter helps with history.  Veronica Wolf has continued to have intermittent burning upper abdominal pain since we saw her last in 2022. The pain  doesn't radiate through to her back though she has been having mid back pain.  Abdominal pain described as same pain be has always had. The pain is not related to physical activity.  Daughter thinks the pain gets better with eating.  Except for yesterday she generally has no nausea. Her weight fluctuates.  Endorses black stool but takes bismuth.  Takes a baby asa everyday, no other NSAIDS.  PCP gave her Carafate which helped the burning pain. She took it for about a month. Sounds like pain returned within a couple of weeks after completing carafate Rx. She wanted a refill but PCP asked her to see GI. Bentyl TID on home med list.  Believes she is taking it only once a day. Pepcid on home med list and she is pretty sure she takes it BID. No dysphagia. Not having reflux symptoms.  Veronica Wolf describes constipation. Has a BM everyday but usually passes only small marble like pieces of stool   Her back is "killing" her which he says is a new problem though daughter feels like this is more chronic, intermittent. Back pain doesn't interfere with sleep. It generally hurts if does excessive walking.    GI History / Pertinent GI Studies   **All endoscopic studies may not be included here    Jan 2022 EGD for epigastric pain and ? GERD - Severe stenosis at the level of the UES/cricopharyngeus. Query presence of Zenker's diverticulum vs severe  web. This was not traversable. - No specimens collected.   May 2022 repeat EGD for evaluation  --- Benign-appearing esophageal stenosis 15 cm from incisors. Dilated with a 12 mm Savary dilator with appropriate mucosal rent, consistent with successful dilation. The stricture was then further fractured using cold forceps. - 2 cm hiatal hernia. - Gastroesophageal flap valve classified as Hill Grade III (minimal fold, loose to endoscope, hiatal hernia likely). - Normal stomach. Biopsied. - Normal examined duodenum.      Latest Ref Rng & Units 03/19/2022    4:25 PM 11/06/2021     4:22 PM 08/26/2021    9:05 AM  Hepatic Function  Total Protein 6.5 - 8.1 g/dL 7.8  6.8  6.7   Albumin 3.5 - 5.0 g/dL 3.7  3.5  3.9   AST 15 - 41 U/L 23  18  13    ALT 0 - 44 U/L 11  10  7    Alk Phosphatase 38 - 126 U/L 73  69  76   Total Bilirubin 0.3 - 1.2 mg/dL 0.7  0.5  0.4        Latest Ref Rng & Units 09/08/2022    9:53 AM 03/19/2022    4:25 PM 01/21/2022   11:58 AM  CBC  WBC 4.0 - 10.5 K/uL 9.6  12.0  10.7   Hemoglobin 12.0 - 15.0 g/dL 82.9  56.2  13.0   Hematocrit 36.0 - 46.0 % 38.8  39.3  39.0   Platelets 150.0 - 400.0 K/uL 218.0  236  234      Past Medical History:  Diagnosis Date   Allergic rhinitis    Allergy    seasonal allergies   Anxiety    CKD (chronic kidney disease) 04/04/2017   COPD (chronic obstructive pulmonary disease) (HCC)    uses inhaler   Depression    Fatty liver    GERD (gastroesophageal reflux disease)    Gout    hx of   Hyperlipidemia    on meds   Hypertension    on meds   Myocardial infarction [facility])    Peptic ulcer 01/04/2003   Vertigo     Past Surgical History:  Procedure Laterality Date   ABDOMINAL HYSTERECTOMY     APPENDECTOMY     BIOPSY  08/13/2020   Procedure: BIOPSY;  Surgeon: Shellia Cleverly, DO;  Location: [facility];  Service: Gastroenterology;;   CORONARY PRESSURE/FFR STUDY N/A 01/27/2022   Procedure: INTRAVASCULAR PRESSURE WIRE/FFR STUDY;  Surgeon: Orbie Pyo, MD;  Location: [facility] INVASIVE CV LAB;  Service: Cardiovascular;  Laterality: N/A;   ESOPHAGOGASTRODUODENOSCOPY (EGD) WITH PROPOFOL N/A 08/13/2020   Procedure: ESOPHAGOGASTRODUODENOSCOPY (EGD) WITH PROPOFOL;  Surgeon: Shellia Cleverly, DO;  Location: [facility];  Service: Gastroenterology;  Laterality: N/A;   LEFT HEART CATH AND CORONARY ANGIOGRAPHY N/A 06/13/2017   Procedure: LEFT HEART CATH AND CORONARY ANGIOGRAPHY;  Surgeon: Marykay Lex, MD;  Location: [facility] INVASIVE CV LAB;  Service: Cardiovascular;  Laterality: N/A;   LEFT HEART CATH AND CORONARY  ANGIOGRAPHY N/A 01/27/2022   Procedure: LEFT HEART CATH AND CORONARY ANGIOGRAPHY;  Surgeon: Orbie Pyo, MD;  Location: [facility] INVASIVE CV LAB;  Service: Cardiovascular;  Laterality: N/A;   RIGHT/LEFT HEART CATH AND CORONARY ANGIOGRAPHY N/A 09/16/2017   Procedure: RIGHT/LEFT HEART CATH AND CORONARY ANGIOGRAPHY;  Surgeon: Swaziland, Peter M, MD;  [facility]: [facility] INVASIVE CV LAB;  Service: Cardiovascular;  Laterality: N/A;   SAVORY DILATION N/A 08/13/2020   Procedure: SAVORY DILATION;  Surgeon: Doristine Locks  V, DO;  Location: [facility];  Service: Gastroenterology;  Laterality: N/A;   ULTRASOUND GUIDANCE FOR VASCULAR ACCESS  09/16/2017   Procedure: Ultrasound Guidance For Vascular Access;  Surgeon: Swaziland, Peter M, MD;  Location: [facility] INVASIVE CV LAB;  Service: Cardiovascular;;   WISDOM TOOTH EXTRACTION      Family History  Problem Relation Age of Onset   Breast cancer Mother    Stroke Father    Stomach cancer Neg Hx    Colon cancer Neg Hx    Pancreatic cancer Neg Hx    Esophageal cancer Neg Hx    Colon polyps Neg Hx    Rectal cancer Neg Hx     Current Medications, Allergies, Family History and Social History were reviewed in [employer] record.     Current Outpatient Medications  Medication Sig Dispense Refill   albuterol (PROVENTIL) (2.5 MG/3ML) 0.083% nebulizer solution USE 1 VIAL IN NEBULIZER EVERY 6 HOURS AS NEEDED FOR WHEEZING AND FOR SHORTNESS OF BREATH 150 mL 0   albuterol (VENTOLIN HFA) 108 (90 Base) MCG/ACT inhaler Inhale 2 puffs into the lungs every 6 (six) hours as needed for wheezing or shortness of breath. 6.7 g 2   allopurinol (ZYLOPRIM) 100 MG tablet Take 2 tablets (200 mg total) by mouth daily. 180 tablet 0   aspirin 81 MG EC tablet Take 1 tablet (81 mg total) by mouth daily. Swallow whole. 30 tablet 12   atorvastatin (LIPITOR) 80 MG tablet Take 1 tablet (80 mg total) by mouth at bedtime. 90 tablet 0   Blood Glucose Monitoring Suppl (ACCU-CHEK GUIDE  ME) w/Device KIT Use to test blood sugar in the morning, at noon, and at bedtime. 1 kit 0   clobetasol ointment (TEMOVATE) 0.05 % Apply 1 Application topically 2 (two) times daily. For up to 2 weeks. 30 g 0   colchicine 0.6 MG tablet Take 0.6 mg by mouth daily as needed (gout).     diazepam (VALIUM) 10 MG tablet Take 1 tablet (10 mg total) by mouth every 12 (twelve) hours as needed for anxiety. 60 tablet 0   dicyclomine (BENTYL) 10 MG capsule Take 1 capsule (10 mg total) by mouth in the morning, at noon, in the evening, and at bedtime. 360 capsule 1   escitalopram (LEXAPRO) 20 MG tablet Take 1 tablet (20 mg total) by mouth every morning. 90 tablet 0   ezetimibe (ZETIA) 10 MG tablet Take 1 tablet (10 mg total) by mouth daily with lunch. 30 tablet 2   famotidine (PEPCID) 20 MG tablet Take 1 tablet (20 mg total) by mouth in the morning and at bedtime. (8 am & 10 pm) 180 tablet 0   isosorbide mononitrate (IMDUR) 60 MG 24 hr tablet Take 1 tablet (60 mg total) by mouth at bedtime. 90 tablet 0   levothyroxine (SYNTHROID) 25 MCG tablet Take 1 tablet (25 mcg total) by mouth before breakfast. 90 tablet 1   lisinopril (ZESTRIL) 2.5 MG tablet Take 1 tablet (2.5 mg total) by mouth daily with lunch. 30 tablet 2   meclizine (ANTIVERT) 25 MG tablet Take 1 tablet (25 mg total) by mouth 3 (three) times daily as needed for dizziness. 90 tablet 0   metoprolol succinate (TOPROL-XL) 25 MG 24 hr tablet Take 0.5 tablets (12.5 mg total) by mouth every evening. 45 tablet 3   nitroGLYCERIN (NITROSTAT) 0.4 MG SL tablet Place 1 tablet (0.4 mg total) under the tongue every 5 (five) minutes as needed for chest pain. 45 tablet  3   nystatin (MYCOSTATIN/NYSTOP) powder Apply 1 Application topically to the affected area(s) 2 (two) times daily. 60 g 4   potassium chloride SA (KLOR-CON M) 20 MEQ tablet Take 1 tablet (20 mEq total) by mouth daily at 12 noon. 90 tablet 0   Probiotic Product (PROBIOTIC DAILY) CAPS Take 1 tablet by mouth  daily. 90 capsule 0   Respiratory Therapy Supplies (NEBULIZER/TUBING/MOUTHPIECE) KIT Use as directed 1 kit 0   sucralfate (CARAFATE) 1 g tablet Take 1 tablet (1 g total) by mouth 3 (three) times daily before meals. 90 tablet 2   valACYclovir (VALTREX) 500 MG tablet Take 1 tablet (500 mg total) by mouth every other day. 45 tablet 1   valACYclovir (VALTREX) 500 MG tablet Take 1 tablet (500 mg total) by mouth every other day. 45 tablet 1   No current facility-administered medications for this visit.    Review of Systems: No chest pain. No shortness of breath. No urinary complaints.   Physical Exam  Filed Weights   02/24/23 1348  Weight: 197 lb 2 oz (89.4 kg)   Wt Readings from Last 3 Encounters:  02/24/23 197 lb 2 oz (89.4 kg)  11/17/22 201 lb (91.2 kg)  11/01/22 202 lb (91.6 kg)    BP 126/72   Pulse 79   Ht 5\' 4"  (1.626 m)   Wt 197 lb 2 oz (89.4 kg)   SpO2 94%   BMI 33.84 kg/m  Constitutional:  Moaning on exam table. Pleasant, cooperative.  EENT: Pupils normal.  Conjunctivae are normal. No scleral icterus. Neck supple.  Cardiovascular: Normal rate, regular rhythm.  Pulmonary/chest: Effort normal and breath sounds normal. No wheezing, rales or rhonchi. Abdominal: Soft, nondistended, mild mid upper abdominal tenderness.   Bowel sounds active throughout. There are no masses palpable. No hepatomegaly. Neurological: Alert and oriented to person place and time.    Veronica Cluster, NP  02/24/2023, 2:22 PM  Cc:  [name], NP

## 2023-02-25 ENCOUNTER — Telehealth: Payer: Self-pay | Admitting: Pharmacist

## 2023-02-25 ENCOUNTER — Telehealth: Payer: Self-pay | Admitting: Nurse Practitioner

## 2023-02-25 ENCOUNTER — Other Ambulatory Visit: Payer: Self-pay | Admitting: Pharmacist

## 2023-02-25 NOTE — Telephone Encounter (Signed)
Called patient's pharmacy and asked if they received prescription for medication. They stated they did but patient not pick it up until 12/13 because it was too soon for the patient to refill. Contacted patient and let her know the reason for her not being able to pick up medication.

## 2023-02-25 NOTE — Telephone Encounter (Signed)
PT went to pick  up dicyclomine and carafate and was told that they only received one prescription. Please advise.

## 2023-02-25 NOTE — Telephone Encounter (Signed)
Attempt was made to contact patient by phone today for follow up by Clinical Pharmacist regarding medication management. Patient was seen yesterday by GI and looks like sucralfate and dicyclomine dose was adjusted. Pre refill records her packaged meds were just filled 02/23/2023 so changed might not be reflected in adherence packaging she will receive / just received.    Unable to reach patient. Unable to leave a message either.

## 2023-02-25 NOTE — Progress Notes (Unsigned)
   02/25/2023 Name: Veronica Wolf MRN: [id number] DOB: [birth year]  No chief complaint on file.   Veronica Wolf is a 82 y.o. year old female. Phone visit was conducted with patient today.    They were referred to the pharmacist by their PCP for assistance in managing medication access and complex medication management.   Subjective: Patient will be due to have adherence packaging refilled soon. It looks like [facility] pharmacy has started to fill maintenance  Checking in today to verify medication regimen and assess adherence.   Care Team: Primary Care Provider: [name], NP ; Next Scheduled Visit: not currently scheduled Cardiologist: Dr Servando Salina; Next Scheduled Visit: not currently scheduled Gastroenterologist: Wilmon Pali; Next Scheduled Visit: 02/24/2023 (was late for appointment 9/4 so had to be rescheduled).     Medication Management / Adherence:  Current adherence strategy: using adherence packaging; Transitioned from [facility] to [facility]   She states she is doing well with [employer]. She is also taking aspirin 81mg  daily at night, metoprolol ER 25mg  0.5 tablet at night, diazepam as needed and valacyclovir every other day. These 4 medications are not included in packaging but patient keeps by her bed to take prior to sleep.   The pharmacy team was able to schedule her medications with last packaging so that she will only have 3 packages per day - 1 in morning, 1 at noon and 1 in evening.    Morning: levothyroxine, sucralfate, allopurinol, pantoprazole, famotidine Noon: sucralfate, potassium, ezetimibe, lisinopril, escitalopram Evening: sucralfate, famotidine, pantoprazole, isosorbide, atorvastatin   Current Pharmacy:   Veronica Wolf - [facility] [address] [place] [address] Phone: [phone] Fax: [phone]  Patient reports affordability concerns with their medications: No  Patient reports  access/transportation concerns to their pharmacy: Yes  Patient reports adherence concerns with their medications:  Yes  - using adherence packaging  Patient reports Fair adherence to medications  Patient reports the following barriers to adherence:  Multiple comorbidities Complex medication regimen Lack of transportation  Pre Diabetes:  Diet: mentions that she is eating a lot of vegetables but when she gets bored she eat ice cream and chocolate.   Objective:  Lab Results  Component Value Date   HGBA1C 6.7 (H) 11/17/2022    Lab Results  Component Value Date   CREATININE 1.39 (H) 02/24/2023   BUN 13 02/24/2023   NA 140 02/24/2023   K 4.1 02/24/2023   CL 105 02/24/2023   CO2 28 02/24/2023    Lab Results  Component Value Date   CHOL 134 07/26/2022   HDL 43.00 07/26/2022   LDLCALC 62 07/26/2022   TRIG 148.0 07/26/2022   CHOLHDL 3 07/26/2022   BP Readings from Last 3 Encounters:  02/24/23 126/72  11/17/22 115/60  11/01/22 130/74    Medications Reviewed Today   Medications were not reviewed in this encounter       Assessment/Plan:   Medication Management: - Reviewed medications with patient.  - Requested refill for metoprolol from [facility]. Patient will be due to refill diazepam and valacyclovir next week.       Follow Up Plan: 3 to 4 weeks    [name], PharmD Clinical Pharmacist [facility]

## 2023-03-01 ENCOUNTER — Other Ambulatory Visit: Payer: Self-pay

## 2023-03-08 ENCOUNTER — Other Ambulatory Visit (HOSPITAL_COMMUNITY): Payer: Self-pay

## 2023-03-08 ENCOUNTER — Other Ambulatory Visit: Payer: Self-pay

## 2023-03-08 ENCOUNTER — Telehealth: Payer: Self-pay | Admitting: Family

## 2023-03-08 ENCOUNTER — Other Ambulatory Visit: Payer: Self-pay | Admitting: Pharmacist

## 2023-03-08 ENCOUNTER — Other Ambulatory Visit: Payer: Self-pay | Admitting: Family

## 2023-03-08 ENCOUNTER — Other Ambulatory Visit (HOSPITAL_BASED_OUTPATIENT_CLINIC_OR_DEPARTMENT_OTHER): Payer: Self-pay

## 2023-03-08 MED ORDER — DIAZEPAM 10 MG PO TABS
10.0000 mg | ORAL_TABLET | Freq: Two times a day (BID) | ORAL | 0 refills | Status: DC | PRN
  Filled 2023-03-08: qty 60, 30d supply, fill #0

## 2023-03-08 NOTE — Telephone Encounter (Signed)
Pt states she was given a medication from our pharmacist and she missed a dose and would like to know what to do. She did not recall the name of rx or who gave her this medication. Please advise if you are aware of this.

## 2023-03-08 NOTE — Telephone Encounter (Signed)
See phone visit notes

## 2023-03-08 NOTE — Telephone Encounter (Signed)
Requesting: diazepam 10mg   Contract: 07/26/22 UDS: 07/26/22 Last Visit: 11/17/22 Next Visit: None Last Refill: 02/01/23 #60 and 0RF   Please Advise

## 2023-03-08 NOTE — Progress Notes (Signed)
   03/08/2023 Name: Veronica Wolf MRN: [id number] DOB: [birth year]  Chief Complaint  Patient presents with   Medication Management    ELOWYN Wolf is a 82 y.o. year old female. Phone visit was conducted with patient today.    They were referred to the pharmacist by their PCP for assistance in managing medication access and complex medication management.   Subjective:  Care Team: Primary Care Provider: [name], NP ; Next Scheduled Visit: not currently scheduled Cardiologist: Dr Servando Salina; Next Scheduled Visit: not currently scheduled Gastroenterologist: Wilmon Pali; Next Scheduled Visit: not currently scheduled - LV was 02/24/2023   Medication Management / Adherence: Patient had adherence packaging refilled 02/23/2023. Unfortunately she saw GI 02/24/2020 and dose of sucralfate was decreased to once a day in the morning and dicyclomine dose changed to take 1 twice a day. I had tried to contact patient last week to review medications and adjust but I was unable to reach her. Patient called to office today because she had woken up late and missed her morning medications. She wanted to know if she could take them now.    Current adherence strategy: using adherence packaging with [facility] She is also taking aspirin 81mg  daily at night, metoprolol ER 25mg  0.5 tablet at night, diazepam as needed and valacyclovir every other day. These 4 medications are not included in packaging but patient keeps by her bed to take prior to sleep.   The pharmacy team has been able to schedule her medications so that she only has 3 packages per day - 1 in morning, 1 at noon and 1 in evening.   Current packaging schedule Morning: levothyroxine, sucralfate, allopurinol, pantoprazole, famotidine Noon: sucralfate, potassium, ezetimibe, lisinopril, escitalopram Evening: sucralfate, famotidine, pantoprazole, isosorbide, atorvastatin   Patient reports affordability concerns with their medications: No   Patient reports access/transportation concerns to their pharmacy: Yes  Patient reports adherence concerns with their medications:  Yes  - using adherence packaging  Patient reports Fair adherence to medications  Patient reports the following barriers to adherence:  Multiple comorbidities Complex medication regimen Lack of transportation   Objective:  Lab Results  Component Value Date   HGBA1C 6.7 (H) 11/17/2022    Lab Results  Component Value Date   CREATININE 1.39 (H) 02/24/2023   BUN 13 02/24/2023   NA 140 02/24/2023   K 4.1 02/24/2023   CL 105 02/24/2023   CO2 28 02/24/2023    Lab Results  Component Value Date   CHOL 134 07/26/2022   HDL 43.00 07/26/2022   LDLCALC 62 07/26/2022   TRIG 148.0 07/26/2022   CHOLHDL 3 07/26/2022   BP Readings from Last 3 Encounters:  02/24/23 126/72  11/17/22 115/60  11/01/22 130/74    Medications Reviewed Today   Medications were not reviewed in this encounter       Assessment/Plan:   Medication Management: - Reviewed medications with patient.  - Ok for patient to take morning and lunch packet now except instructed her to remove 1 sucralfate - large pink pill from lunch packet since is also in morning packet.  - Plan to do a home visit tomorrow to assist patient in updating her sucralfate and dicyclomine to take according to new directions from Dr Wilmon Pali. Will also make sure next adherence packaging has updated directions (next due around 03/23/2023)     [name], PharmD Clinical Pharmacist [facility]

## 2023-03-09 ENCOUNTER — Other Ambulatory Visit (HOSPITAL_COMMUNITY): Payer: Self-pay

## 2023-03-09 ENCOUNTER — Ambulatory Visit: Payer: Medicare PPO

## 2023-03-09 DIAGNOSIS — Z79899 Other long term (current) drug therapy: Secondary | ICD-10-CM

## 2023-03-09 NOTE — Progress Notes (Unsigned)
   03/09/2023 Name: Veronica Wolf MRN: [id number] DOB: 12-04-1940  No chief complaint on file.   Veronica Wolf is a 82 y.o. year old female who was referred for medication management by their primary care provider, [name], NP. They presented for a home visit today.   They were referred to the pharmacist by their PCP for assistance in managing {referralreason:27271}    Subjective:  Care Team: Primary Care Provider: [name], NP ; Next Scheduled Visit: *** {careteamprovider:27366}  Medication Access/Adherence  Current Pharmacy:  Gerri Spore LONG - [facility] [address] [place] [address] Phone: [phone] Fax: [phone]  [facility] [address] ([place]), [place] - [address] [place] [address] Phone: [phone] Fax: [phone]  [facility] [address] [place], [place] - [address] [place] [address] Phone: [phone] Fax: [phone]   Patient reports affordability concerns with their medications: {YES/NO:21197} Patient reports access/transportation concerns to their pharmacy: {YES/NO:21197} Patient reports adherence concerns with their medications:  {YES/NO:21197} ***   {Pharmacy S/O Choices:26420}   Objective:  Lab Results  Component Value Date   HGBA1C 6.7 (H) 11/17/2022    Lab Results  Component Value Date   CREATININE 1.39 (H) 02/24/2023   BUN 13 02/24/2023   NA 140 02/24/2023   K 4.1 02/24/2023   CL 105 02/24/2023   CO2 28 02/24/2023    Lab Results  Component Value Date   CHOL 134 07/26/2022   HDL 43.00 07/26/2022   LDLCALC 62 07/26/2022   TRIG 148.0 07/26/2022   CHOLHDL 3 07/26/2022    Medications Reviewed Today   Medications were not reviewed in this encounter       Assessment/Plan:   {Pharmacy A/P Choices:26421}  Follow Up Plan: ***  ***

## 2023-03-09 NOTE — Progress Notes (Signed)
Agree with the assessment and plan as outlined by Willette Cluster, NP.  Agree with reduced dose Carafate due to CKD.  Recommend periodic BMP check if taking Carafate long-term.  Saleha Kalp, DO, [facility]

## 2023-03-10 ENCOUNTER — Telehealth: Payer: Self-pay | Admitting: Pharmacist

## 2023-03-10 ENCOUNTER — Other Ambulatory Visit: Payer: Self-pay | Admitting: Cardiology

## 2023-03-10 ENCOUNTER — Other Ambulatory Visit (HOSPITAL_COMMUNITY): Payer: Self-pay

## 2023-03-10 ENCOUNTER — Other Ambulatory Visit: Payer: Self-pay | Admitting: Family

## 2023-03-10 ENCOUNTER — Other Ambulatory Visit: Payer: Self-pay | Admitting: Family Medicine

## 2023-03-10 MED ORDER — LISINOPRIL 2.5 MG PO TABS
2.5000 mg | ORAL_TABLET | Freq: Every day | ORAL | 0 refills | Status: DC
  Filled 2023-03-10: qty 90, 90d supply, fill #0
  Filled 2023-03-24: qty 30, 30d supply, fill #0
  Filled 2023-04-08 – 2023-04-19 (×3): qty 30, 30d supply, fill #1
  Filled 2023-05-23: qty 30, 30d supply, fill #2

## 2023-03-10 MED ORDER — ISOSORBIDE MONONITRATE ER 60 MG PO TB24
60.0000 mg | ORAL_TABLET | Freq: Every day | ORAL | 0 refills | Status: DC
  Filled 2023-03-10 – 2023-03-21 (×2): qty 90, 90d supply, fill #0
  Filled 2023-03-24 (×3): qty 30, 30d supply, fill #0
  Filled 2023-04-14 – 2023-04-19 (×2): qty 30, 30d supply, fill #1
  Filled 2023-05-23: qty 30, 30d supply, fill #2

## 2023-03-10 MED ORDER — EZETIMIBE 10 MG PO TABS
10.0000 mg | ORAL_TABLET | Freq: Every day | ORAL | 0 refills | Status: DC
  Filled 2023-03-10: qty 90, 90d supply, fill #0
  Filled 2023-03-24: qty 30, 30d supply, fill #0
  Filled 2023-04-14 – 2023-04-19 (×2): qty 30, 30d supply, fill #1
  Filled 2023-05-23: qty 30, 30d supply, fill #2

## 2023-03-10 MED ORDER — ALLOPURINOL 100 MG PO TABS
200.0000 mg | ORAL_TABLET | Freq: Every day | ORAL | 0 refills | Status: DC
  Filled 2023-03-10 – 2023-03-21 (×2): qty 180, 90d supply, fill #0
  Filled 2023-03-24: qty 60, 30d supply, fill #0
  Filled 2023-03-24: qty 180, 90d supply, fill #0
  Filled 2023-03-24: qty 60, 30d supply, fill #0
  Filled 2023-04-14 – 2023-04-19 (×3): qty 60, 30d supply, fill #1
  Filled 2023-05-20 – 2023-05-23 (×2): qty 60, 30d supply, fill #2

## 2023-03-10 MED ORDER — ESCITALOPRAM OXALATE 20 MG PO TABS
20.0000 mg | ORAL_TABLET | Freq: Every morning | ORAL | 0 refills | Status: DC
  Filled 2023-03-10 – 2023-03-21 (×2): qty 90, 90d supply, fill #0
  Filled 2023-03-24 (×2): qty 30, 30d supply, fill #0
  Filled 2023-03-24: qty 90, 90d supply, fill #0
  Filled 2023-04-14 – 2023-04-19 (×2): qty 30, 30d supply, fill #1

## 2023-03-10 MED FILL — Famotidine Tab 20 MG: ORAL | 30 days supply | Qty: 60 | Fill #2 | Status: CN

## 2023-03-10 NOTE — Telephone Encounter (Signed)
Pt called stating that she had two packages left with her on her Population Health appt on 12.4.24 and was unsure how to take them. Pt would like a call back to clarify this info when possible.

## 2023-03-10 NOTE — Telephone Encounter (Signed)
Called patient back regarding her question. She states she missed her morning medications and wonders if she can take morning and noon medications at the same time. Instructed patient that she can take the meds for morning and noon together.  Discussed how to use the pill container provided yesterday.

## 2023-03-18 ENCOUNTER — Other Ambulatory Visit: Payer: Self-pay | Admitting: Pharmacist

## 2023-03-21 ENCOUNTER — Other Ambulatory Visit: Payer: Self-pay

## 2023-03-21 ENCOUNTER — Encounter: Payer: Self-pay | Admitting: Family Medicine

## 2023-03-21 ENCOUNTER — Other Ambulatory Visit (HOSPITAL_COMMUNITY): Payer: Self-pay

## 2023-03-21 ENCOUNTER — Other Ambulatory Visit: Payer: Self-pay | Admitting: Cardiology

## 2023-03-21 ENCOUNTER — Ambulatory Visit (INDEPENDENT_AMBULATORY_CARE_PROVIDER_SITE_OTHER): Payer: Medicare PPO | Admitting: Family Medicine

## 2023-03-21 VITALS — HR 105 | Temp 98.5°F | Resp 20 | Ht 64.0 in | Wt 197.4 lb

## 2023-03-21 DIAGNOSIS — J441 Chronic obstructive pulmonary disease with (acute) exacerbation: Secondary | ICD-10-CM | POA: Diagnosis not present

## 2023-03-21 DIAGNOSIS — R1010 Upper abdominal pain, unspecified: Secondary | ICD-10-CM | POA: Diagnosis not present

## 2023-03-21 DIAGNOSIS — G8929 Other chronic pain: Secondary | ICD-10-CM

## 2023-03-21 MED ORDER — AMITRIPTYLINE HCL 10 MG PO TABS
10.0000 mg | ORAL_TABLET | Freq: Every day | ORAL | 1 refills | Status: DC
  Filled 2023-03-21 (×2): qty 30, 30d supply, fill #0
  Filled 2023-04-08 – 2023-04-19 (×3): qty 30, 30d supply, fill #1
  Filled ????-??-??: fill #0

## 2023-03-21 MED ORDER — METHYLPREDNISOLONE ACETATE 80 MG/ML IJ SUSP
80.0000 mg | Freq: Once | INTRAMUSCULAR | Status: AC
  Administered 2023-03-21: 80 mg via INTRAMUSCULAR

## 2023-03-21 MED FILL — Metoprolol Succinate Tab ER 24HR 25 MG (Tartrate Equiv): ORAL | 30 days supply | Qty: 15 | Fill #4 | Status: CN

## 2023-03-21 MED FILL — Famotidine Tab 20 MG: ORAL | 30 days supply | Qty: 60 | Fill #2 | Status: CN

## 2023-03-21 NOTE — Patient Instructions (Addendum)
Try to drink 55-60 oz of water daily outside of exercise.  Follow up with your GI team regarding your abdominal pain in the few weeks.   Try 2 tablespoons of milk of mag in 4 oz of warm prune juice. Do that and wait a couple hours. If no improvement, try a Dulcolax suppository and then let me know if we are still having issues.    Let us know if you need anything.

## 2023-03-21 NOTE — Progress Notes (Signed)
Chief Complaint  Patient presents with   GI Problem    Gi problem     [facility] is here for upper abdominal pain. Here w daughter who helps w hx as she is limited.  Duration: 1  d Nighttime awakenings? Yes Bleeding? No blood, tarry stools starting several days ago (Takes  Weight loss? No Palliation: none Provocation: none Associated symptoms: none Denies: fever, bowel changes, nausea/vomiting Treatment to date: Pepcid, Bentyl which has not been helpful  Patient has a history of COPD.  She is on albuterol as needed which she has been using daily.  She has having some wheezing and shortness of breath.  This has been going on for around a week.  No recent illness, fevers, or chest pain.  Past Medical History:  Diagnosis Date   Allergic rhinitis    Allergy    seasonal allergies   Anxiety    CKD (chronic kidney disease) 04/04/2017   COPD (chronic obstructive pulmonary disease) (HCC)    uses inhaler   Depression    Fatty liver    GERD (gastroesophageal reflux disease)    Gout    hx of   Hyperlipidemia    on meds   Hypertension    on meds   Myocardial infarction (HCC)    Peptic ulcer 01/04/2003   Vertigo     Pulse (!) 105   Temp 98.5 F (36.9 C) (Oral)   Resp 20   Ht 5\' 4"  (1.626 m)   Wt 197 lb 6.4 oz (89.5 kg)   SpO2 97%   BMI 33.88 kg/m  Gen.: Awake, alert, appears stated age HEENT: Mucous membranes moist without mucosal lesions Heart: Regular rate and rhythm without murmurs Lungs: Diffuse expiratory wheezes, no accessory muscle use. Abdomen: Bowel sounds are present. Abdomen is soft, TTP in the epigastric region, nondistended, no masses or organomegaly. Negative Murphy's, Rovsing's, McBurney's, and Carnett's sign. Psych: Normal mood and affect.  Chronic upper abdominal pain - Plan: amitriptyline (ELAVIL) 10 MG tablet, methylPREDNISolone acetate (DEPO-MEDROL) injection 80 mg  COPD exacerbation (HCC)  Chronic, not controlled.  Trial Elavil 10 mg nightly.   She has had an extensive workup through gastroenterology.  Would have her follow-up with them if no improvement. Exacerbation of chronic issue.  Depo-Medrol injection today.  Follow-up in 3 weeks with regular PCP.  She will track how often she is using her rescue inhaler to help determine the next steps. Pt voiced understanding and agreement to the plan.  Jilda Roche [place], DO 03/21/23 11:45 AM

## 2023-03-22 ENCOUNTER — Other Ambulatory Visit (HOSPITAL_COMMUNITY): Payer: Self-pay

## 2023-03-22 MED FILL — Atorvastatin Calcium Tab 80 MG (Base Equivalent): ORAL | 15 days supply | Qty: 15 | Fill #0 | Status: CN

## 2023-03-23 ENCOUNTER — Ambulatory Visit (INDEPENDENT_AMBULATORY_CARE_PROVIDER_SITE_OTHER): Payer: Medicare PPO | Admitting: Pharmacist

## 2023-03-23 DIAGNOSIS — Z79899 Other long term (current) drug therapy: Secondary | ICD-10-CM

## 2023-03-23 NOTE — Progress Notes (Unsigned)
03/09/2023 Name: Veronica Wolf MRN: [id number] DOB: [birth year]  No chief complaint on file.   Veronica Wolf is a 82 y.o. year old female who was referred for medication management by their primary care provider, [name], NP. They presented for a phone visit today.    They were referred to the pharmacist by their PCP for assistance in managing complex medication management    Subjective:  Medication Access/Adherence  Current Pharmacy:  [facility] [address] [place] [address] Phone: [phone] Fax: [phone]  Patient reports affordability concerns with their medications: No  Patient reports access/transportation concerns to their pharmacy:  medications are delivered to her - if needed her daughter can pick up medications Patient reports adherence concerns with their medications:  Yes  -    Medication Management:  Current adherence strategy: uses adherence packaging Reviewed patient's adherence packages. Packaging was filled 02/23/2023 - not sure start date because patient does not have box they came in. Instead she with the assistance of her daughter has separated packets by the date and put them in a clear container with all Mondays, Tuesdays, etc together.   Patient states she was previously getting just 3 packages per day but this last has 5 packages.    Current packaging schedule Morning: Package 1:  levothyroxine, allopurinol (#2), pantoprazole, famotidine, dicyclomine Package 2: sucralfate Noon: sucralfate, potassium, ezetimibe, lisinopril, escitalopram, dicyclomine Evening: sucralfate, pantoprazole, isosorbide, dicyclomine Bedtime: dicyclomine, famotide, atorvastatin  She is also taking aspirin 81mg  daily at night, metoprolol ER 25mg  0.5 tablet at night, diazepam as needed and valacyclovir every other day . These 4 medications are not included in packaging but patient keeps by her bed to take prior to sleep  because they cannot be included in packaging or is an as needed medication.  Patient reports Fair adherence to medications  Patient reports the following barriers to adherence: She has difficulty opening adherence packages and sometimes tears the next package and loses pills.   Objective:  Lab Results  Component Value Date   HGBA1C 6.7 (H) 11/17/2022    Lab Results  Component Value Date   CREATININE 1.39 (H) 02/24/2023   BUN 13 02/24/2023   NA 140 02/24/2023   K 4.1 02/24/2023   CL 105 02/24/2023   CO2 28 02/24/2023    Lab Results  Component Value Date   CHOL 134 07/26/2022   HDL 43.00 07/26/2022   LDLCALC 62 07/26/2022   TRIG 148.0 07/26/2022   CHOLHDL 3 07/26/2022    Medications Reviewed Today     Reviewed by [name], RPH-CPP (Pharmacist) on 03/23/23 at 1608  Med List Status: <None>   Medication Order Taking? Sig Documenting Provider Last Dose Status Informant  albuterol (PROVENTIL) (2.5 MG/3ML) 0.083% nebulizer solution [id number]  USE 1 VIAL IN NEBULIZER EVERY 6 HOURS AS NEEDED FOR WHEEZING AND FOR SHORTNESS OF Garnet Si[place], Efraim Kaufmann, NP  Active   albuterol (VENTOLIN HFA) 108 (90 Base) MCG/ACT inhaler [id number]  Inhale 2 puffs into the lungs every 6 (six) hours as needed for wheezing or shortness of breath. [name], NP  Active   allopurinol (ZYLOPRIM) 100 MG tablet [id number]  Take 2 tablets (200 mg total) by mouth daily. [name], NP  Active   amitriptyline (ELAVIL) 10 MG tablet [id number]  Take 1 tablet (10 mg total) by mouth at bedtime. Sharlene Dory, [place]  Active   aspirin 81 MG EC tablet [id number]  Take 1 tablet (81 mg  total) by mouth daily. Swallow whole. Thomasene Ripple, DO  Active            Med Note Clydie Braun, Carmela Piechowski B   Wed Oct 07, 2021 11:19 AM)    atorvastatin (LIPITOR) 80 MG tablet [id number]  Take 1 tablet (80 mg total) by mouth at bedtime. Tobb, Kardie, DO  Active   Blood Glucose Monitoring Suppl (ACCU-CHEK GUIDE ME)  w/Device KIT [id number]  Use to test blood sugar in the morning, at noon, and at bedtime. [name], NP  Active   clobetasol ointment (TEMOVATE) 0.05 % [id number]  Apply 1 Application topically 2 (two) times daily. For up to 2 weeks. [name], NP  Active   colchicine 0.6 MG tablet [id number]  Take 0.6 mg by mouth daily as needed (gout). [provider]  Active   diazepam (VALIUM) 10 MG tablet [id number]  Take 1 tablet (10 mg total) by mouth every 12 (twelve) hours as needed for anxiety. [name], NP  Active   dicyclomine (BENTYL) 10 MG capsule [id number] Yes Take 1 capsule (10 mg total) by mouth 2 (two) times daily. Meredith Pel, NP Taking Active            Med Note [name] B   Wed Mar 23, 2023  4:02 PM)    escitalopram (LEXAPRO) 20 MG tablet [id number]  Take 1 tablet (20 mg total) by mouth every morning. [name], NP  Active   ezetimibe (ZETIA) 10 MG tablet [id number]  Take 1 tablet (10 mg total) by mouth daily with lunch. [name], NP  Active   famotidine (PEPCID) 20 MG tablet [id number]  Take 1 tablet (20 mg total) by mouth in the morning and at bedtime. (8 am & 10 pm) [name], NP  Active   isosorbide mononitrate (IMDUR) 60 MG 24 hr tablet [id number]  Take 1 tablet (60 mg total) by mouth daily at 6 PM. [name], NP  Active   levothyroxine (SYNTHROID) 25 MCG tablet [id number]  Take 1 tablet (25 mcg total) by mouth before breakfast. Bradd Canary, MD  Active   lisinopril (ZESTRIL) 2.5 MG tablet [id number]  Take 1 tablet (2.5 mg total) by mouth daily with lunch. [name], NP  Active   meclizine (ANTIVERT) 25 MG tablet [id number]  Take 1 tablet (25 mg total) by mouth 3 (three) times daily as needed for dizziness. [name], NP  Active   metoprolol succinate (TOPROL-XL) 25 MG 24 hr tablet [id number]  Take 0.5 tablets (12.5 mg total) by mouth every evening. Tobb, Kardie, DO  Active   nitroGLYCERIN  (NITROSTAT) 0.4 MG SL tablet [id number]  Place 1 tablet (0.4 mg total) under the tongue every 5 (five) minutes as needed for chest pain. Tobb, Kardie, DO  Active   nystatin (MYCOSTATIN/NYSTOP) powder [id number]  Apply 1 Application topically to the affected area(s) 2 (two) times daily. [name], NP  Active   potassium chloride SA (KLOR-CON M) 20 MEQ tablet [id number]  Take 1 tablet (20 mEq total) by mouth daily at 12 noon. [name], NP  Active   Probiotic Product (PROBIOTIC DAILY) CAPS [id number]  Take 1 tablet by mouth daily. Clayborne Dana, NP  Active   Respiratory Therapy Supplies (NEBULIZER/TUBING/MOUTHPIECE) Andria Rhein [id number]  Use as directed [name], NP  Active   sucralfate (CARAFATE) 1 g tablet [id number] Yes Take 1 tablet (1 g total) by mouth daily. Take one hour prior to morning medications. Meredith Pel, NP  Active  Med Note Clydie Braun, Bexleigh Theriault B   Wed Mar 23, 2023  4:03 PM)    valACYclovir (VALTREX) 500 MG tablet [id number]  Take 1 tablet (500 mg total) by mouth every other day. [name], NP  Active   valACYclovir (VALTREX) 500 MG tablet [id number]  Take 1 tablet (500 mg total) by mouth every other day.   Active               Assessment/Plan:   Medication Management: - Currently strategy has improved adherence but recent med changes are not reflected in current adherence packaging patient has.  - Provided patient with 2 weekly pill containers. Removed doses of dicyclomine and sucralfate based on recommended changes from Dr Wilmon Pali 02/24/2023  Morning: sucralfate, levothyroxine, allopurinol (#2), pantoprazole, famotidine, dicyclomine Noon: potassium, ezetimibe, lisinopril, escitalopram Evening: pantoprazole, isosorbide, dicyclomine, famotide, atorvastatin  Continue to take aspirin 81mg  daily at night, metoprolol ER 25mg  0.5 tablet at night, diazepam as needed and valacyclovir every other day at night (these are not included in her  packaging. Continue to keep by your bed to remember to take at bedtime.   Will coordinate with pharmacy regarding changes in packaging. She has enough medication in weekly pill box to last until 12/19. (Has packaging to last until 12/25 though I think it was suppose to last until 12/28). If new packaging cannot be sent out to start 12/19 - I will fill weekly pill boxes for her again.   Recommended schedule for packaging with recent changes (trying to use as few packages per day as possible because multiple package scheduled for the same time of day has confused patient in the past)  Morning 8am: sucralfate Breakfast 9am: levothyroxine, pantoprazole, famotidine, dicyclomine, allopurinol (#2 tabs) Lunch- 12pm / noon:  potassium, ezetimibe, lisinopril, escitalopram Evening 6pm: pantoprazole, isosorbide, dicyclomine, famotidine, atorvastatin    Follow Up Plan: 2 weeks.   [name], PharmD Clinical Pharmacist [facility]

## 2023-03-24 ENCOUNTER — Other Ambulatory Visit: Payer: Self-pay

## 2023-03-24 ENCOUNTER — Other Ambulatory Visit (HOSPITAL_COMMUNITY): Payer: Self-pay

## 2023-03-24 ENCOUNTER — Other Ambulatory Visit: Payer: Self-pay | Admitting: Nurse Practitioner

## 2023-03-24 ENCOUNTER — Other Ambulatory Visit: Payer: Self-pay | Admitting: Family

## 2023-03-24 MED ORDER — SUCRALFATE 1 G PO TABS
1.0000 g | ORAL_TABLET | Freq: Every day | ORAL | 0 refills | Status: DC
  Filled 2023-03-24: qty 30, 30d supply, fill #0

## 2023-03-24 MED ORDER — DICYCLOMINE HCL 10 MG PO CAPS
10.0000 mg | ORAL_CAPSULE | Freq: Two times a day (BID) | ORAL | 5 refills | Status: DC
  Filled 2023-03-24: qty 60, 30d supply, fill #0
  Filled 2023-04-14 – 2023-04-19 (×2): qty 60, 30d supply, fill #1
  Filled 2023-05-23: qty 60, 30d supply, fill #2
  Filled 2023-06-09 – 2023-06-16 (×3): qty 60, 30d supply, fill #3
  Filled 2023-07-07 – 2023-07-12 (×2): qty 60, 30d supply, fill #4
  Filled 2023-07-26 – 2023-08-12 (×3): qty 60, 30d supply, fill #5

## 2023-03-24 MED FILL — Atorvastatin Calcium Tab 80 MG (Base Equivalent): ORAL | 15 days supply | Qty: 15 | Fill #0 | Status: CN

## 2023-03-24 MED FILL — Famotidine Tab 20 MG: ORAL | 30 days supply | Qty: 60 | Fill #2 | Status: CN

## 2023-03-24 MED FILL — Metoprolol Succinate Tab ER 24HR 25 MG (Tartrate Equiv): ORAL | 30 days supply | Qty: 15 | Fill #4 | Status: CN

## 2023-03-24 MED FILL — Atorvastatin Calcium Tab 80 MG (Base Equivalent): ORAL | 15 days supply | Qty: 15 | Fill #0 | Status: AC

## 2023-03-24 MED FILL — Famotidine Tab 20 MG: ORAL | 30 days supply | Qty: 60 | Fill #2 | Status: AC

## 2023-03-25 ENCOUNTER — Other Ambulatory Visit: Payer: Self-pay

## 2023-03-25 MED FILL — Metoprolol Succinate Tab ER 24HR 25 MG (Tartrate Equiv): ORAL | 30 days supply | Qty: 15 | Fill #4 | Status: AC

## 2023-03-31 ENCOUNTER — Other Ambulatory Visit: Payer: Self-pay | Admitting: Family

## 2023-03-31 ENCOUNTER — Other Ambulatory Visit (HOSPITAL_BASED_OUTPATIENT_CLINIC_OR_DEPARTMENT_OTHER): Payer: Self-pay

## 2023-03-31 ENCOUNTER — Other Ambulatory Visit (HOSPITAL_COMMUNITY): Payer: Self-pay

## 2023-03-31 MED ORDER — DIAZEPAM 10 MG PO TABS
10.0000 mg | ORAL_TABLET | Freq: Two times a day (BID) | ORAL | 0 refills | Status: DC | PRN
  Filled 2023-03-31 – 2023-04-07 (×3): qty 60, 30d supply, fill #0
  Filled ????-??-??: fill #0

## 2023-04-04 ENCOUNTER — Other Ambulatory Visit (HOSPITAL_COMMUNITY): Payer: Self-pay

## 2023-04-07 ENCOUNTER — Other Ambulatory Visit: Payer: Self-pay

## 2023-04-07 ENCOUNTER — Other Ambulatory Visit (HOSPITAL_COMMUNITY): Payer: Self-pay

## 2023-04-08 ENCOUNTER — Other Ambulatory Visit: Payer: Self-pay

## 2023-04-08 ENCOUNTER — Other Ambulatory Visit: Payer: Self-pay | Admitting: Nurse Practitioner

## 2023-04-08 ENCOUNTER — Other Ambulatory Visit: Payer: Self-pay | Admitting: Family

## 2023-04-08 ENCOUNTER — Other Ambulatory Visit: Payer: Self-pay | Admitting: Cardiology

## 2023-04-08 ENCOUNTER — Other Ambulatory Visit (HOSPITAL_COMMUNITY): Payer: Self-pay

## 2023-04-08 MED ORDER — SUCRALFATE 1 G PO TABS
1.0000 g | ORAL_TABLET | Freq: Every day | ORAL | 0 refills | Status: DC
  Filled 2023-04-08 – 2023-04-19 (×3): qty 30, 30d supply, fill #0

## 2023-04-08 MED ORDER — FAMOTIDINE 20 MG PO TABS
20.0000 mg | ORAL_TABLET | Freq: Two times a day (BID) | ORAL | 0 refills | Status: DC
  Filled 2023-04-08: qty 180, 90d supply, fill #0
  Filled 2023-04-14 – 2023-04-19 (×2): qty 60, 30d supply, fill #0
  Filled 2023-05-23: qty 60, 30d supply, fill #1
  Filled 2023-06-09 – 2023-06-16 (×3): qty 60, 30d supply, fill #2

## 2023-04-08 MED ORDER — ATORVASTATIN CALCIUM 80 MG PO TABS
80.0000 mg | ORAL_TABLET | Freq: Every day | ORAL | 0 refills | Status: DC
  Filled 2023-04-08 – 2023-04-19 (×3): qty 15, 15d supply, fill #0

## 2023-04-09 ENCOUNTER — Other Ambulatory Visit (HOSPITAL_COMMUNITY): Payer: Self-pay

## 2023-04-10 ENCOUNTER — Other Ambulatory Visit (HOSPITAL_COMMUNITY): Payer: Self-pay

## 2023-04-11 ENCOUNTER — Other Ambulatory Visit (HOSPITAL_COMMUNITY): Payer: Self-pay

## 2023-04-11 ENCOUNTER — Other Ambulatory Visit: Payer: Self-pay

## 2023-04-13 ENCOUNTER — Other Ambulatory Visit: Payer: Self-pay

## 2023-04-14 ENCOUNTER — Other Ambulatory Visit: Payer: Self-pay

## 2023-04-14 ENCOUNTER — Other Ambulatory Visit (HOSPITAL_COMMUNITY): Payer: Self-pay

## 2023-04-14 MED FILL — Metoprolol Succinate Tab ER 24HR 25 MG (Tartrate Equiv): ORAL | 30 days supply | Qty: 15 | Fill #5 | Status: CN

## 2023-04-19 ENCOUNTER — Other Ambulatory Visit: Payer: Self-pay

## 2023-04-19 MED FILL — Metoprolol Succinate Tab ER 24HR 25 MG (Tartrate Equiv): ORAL | 30 days supply | Qty: 15 | Fill #5 | Status: CN

## 2023-04-19 MED FILL — Metoprolol Succinate Tab ER 24HR 25 MG (Tartrate Equiv): ORAL | 30 days supply | Qty: 15 | Fill #5 | Status: AC

## 2023-04-22 ENCOUNTER — Ambulatory Visit: Payer: Medicare PPO | Admitting: Family

## 2023-04-22 ENCOUNTER — Other Ambulatory Visit: Payer: Self-pay

## 2023-04-22 ENCOUNTER — Other Ambulatory Visit (HOSPITAL_COMMUNITY): Payer: Self-pay

## 2023-04-22 ENCOUNTER — Ambulatory Visit (INDEPENDENT_AMBULATORY_CARE_PROVIDER_SITE_OTHER): Payer: Medicare HMO | Admitting: Family

## 2023-04-22 VITALS — BP 122/47 | HR 65 | Temp 98.4°F | Resp 16 | Ht 64.0 in | Wt 202.0 lb

## 2023-04-22 DIAGNOSIS — E782 Mixed hyperlipidemia: Secondary | ICD-10-CM

## 2023-04-22 DIAGNOSIS — Z23 Encounter for immunization: Secondary | ICD-10-CM | POA: Diagnosis not present

## 2023-04-22 DIAGNOSIS — G8929 Other chronic pain: Secondary | ICD-10-CM

## 2023-04-22 DIAGNOSIS — R1013 Epigastric pain: Secondary | ICD-10-CM

## 2023-04-22 DIAGNOSIS — E118 Type 2 diabetes mellitus with unspecified complications: Secondary | ICD-10-CM | POA: Diagnosis not present

## 2023-04-22 DIAGNOSIS — F418 Other specified anxiety disorders: Secondary | ICD-10-CM | POA: Diagnosis not present

## 2023-04-22 DIAGNOSIS — B009 Herpesviral infection, unspecified: Secondary | ICD-10-CM | POA: Diagnosis not present

## 2023-04-22 DIAGNOSIS — I10 Essential (primary) hypertension: Secondary | ICD-10-CM

## 2023-04-22 DIAGNOSIS — E039 Hypothyroidism, unspecified: Secondary | ICD-10-CM | POA: Diagnosis not present

## 2023-04-22 DIAGNOSIS — R1010 Upper abdominal pain, unspecified: Secondary | ICD-10-CM | POA: Diagnosis not present

## 2023-04-22 LAB — COMPREHENSIVE METABOLIC PANEL
ALT: 7 U/L (ref 0–35)
AST: 13 U/L (ref 0–37)
Albumin: 3.9 g/dL (ref 3.5–5.2)
Alkaline Phosphatase: 72 U/L (ref 39–117)
BUN: 11 mg/dL (ref 6–23)
CO2: 29 meq/L (ref 19–32)
Calcium: 8.9 mg/dL (ref 8.4–10.5)
Chloride: 106 meq/L (ref 96–112)
Creatinine, Ser: 1.49 mg/dL — ABNORMAL HIGH (ref 0.40–1.20)
GFR: 32.21 mL/min — ABNORMAL LOW (ref >60.00)
Glucose, Bld: 78 mg/dL (ref 70–99)
Potassium: 4.4 meq/L (ref 3.5–5.1)
Sodium: 142 meq/L (ref 135–145)
Total Bilirubin: 0.3 mg/dL (ref 0.2–1.2)
Total Protein: 7.1 g/dL (ref 6.0–8.3)

## 2023-04-22 LAB — LIPID PANEL
Cholesterol: 242 mg/dL — ABNORMAL HIGH (ref 0–200)
HDL: 45.5 mg/dL (ref >39.00)
LDL Cholesterol: 153 mg/dL — ABNORMAL HIGH (ref 0–99)
NonHDL: 196.2
Total CHOL/HDL Ratio: 5
Triglycerides: 217 mg/dL — ABNORMAL HIGH (ref 0.0–149.0)
VLDL: 43.4 mg/dL — ABNORMAL HIGH (ref 0.0–40.0)

## 2023-04-22 LAB — TSH: TSH: 4.34 u[IU]/mL (ref 0.35–5.50)

## 2023-04-22 LAB — HEMOGLOBIN A1C: Hgb A1c MFr Bld: 6.8 % — ABNORMAL HIGH (ref 4.6–6.5)

## 2023-04-22 MED ORDER — AMITRIPTYLINE HCL 10 MG PO TABS
10.0000 mg | ORAL_TABLET | Freq: Every day | ORAL | 0 refills | Status: DC
  Filled 2023-04-22: qty 90, 90d supply, fill #0
  Filled 2023-05-11: qty 30, 30d supply, fill #0

## 2023-04-22 MED ORDER — VALACYCLOVIR HCL 500 MG PO TABS
500.0000 mg | ORAL_TABLET | ORAL | 1 refills | Status: DC
  Filled 2023-04-22: qty 45, 90d supply, fill #0
  Filled 2023-06-08: qty 15, 30d supply, fill #0
  Filled 2023-07-07 (×2): qty 15, 30d supply, fill #1
  Filled 2023-07-26 – 2023-08-12 (×4): qty 15, 30d supply, fill #2
  Filled 2023-08-31 – 2023-09-06 (×3): qty 15, 30d supply, fill #3
  Filled 2023-10-06 – 2023-10-10 (×2): qty 15, 30d supply, fill #4
  Filled 2023-11-08 – 2023-11-09 (×3): qty 15, 30d supply, fill #5

## 2023-04-22 NOTE — Assessment & Plan Note (Signed)
Lab Results  Component Value Date   HGBA1C 6.7 (H) 11/17/2022   HGBA1C 6.7 (H) 02/05/2022   HGBA1C 6.5 01/09/2021   Lab Results  Component Value Date   MICROALBUR 1.4 07/28/2022   LDLCALC 62 07/26/2022   CREATININE 1.39 (H) 02/24/2023

## 2023-04-22 NOTE — Assessment & Plan Note (Signed)
Lab Results  Component Value Date   TSH 3.29 11/17/2022   She is maintained on synthroid, update tsh.

## 2023-04-22 NOTE — Assessment & Plan Note (Signed)
Chronic.  Some improvement with addition of elavil. Continue same, refills sent.

## 2023-04-22 NOTE — Assessment & Plan Note (Signed)
Uncontrolled off of suppressive valtrex- ran out. Send refill. Pt to restart.

## 2023-04-22 NOTE — Progress Notes (Signed)
Subjective:     Patient ID: Veronica Wolf, female    DOB: [birth year], 83 y.o.   MRN: [id number]  Chief Complaint  Patient presents with   Abdominal Pain    Patient complains of abdominal pain   Depression    Patient complains of increased depression symptoms    HPI  Discussed the use of AI scribe software for clinical note transcription with the patient, who gave verbal consent to proceed.  History of Present Illness    The patient, with a history of depression and memory loss, presents with worsening depressive symptoms. She is accompanied today by her daughter. She reports feeling isolated and lonely, stating, "I'm not doing anything. I'm not going nowhere. I don't have any friends." She expresses a strong desire to attend church but lacks transportation and support from family members. She has been crying frequently and feels neglected by her family, except for one grandchild who lives in [place]. She gave her car to her grandson but is not sure she wants it back as she sometimes doesn't like driving anymore.   The patient also mentions a desire to move back to [place], but she has concerns about housing and the ability to care for her unwell sister who lives in [place]. She also expresses dissatisfaction with her current living situation, living alone in a large townhouse.  In addition to her depressive symptoms, the patient reports stomach pain which is chronic and a recurrence of a previous herpes infection noting that she is out of valtrex.  She was prescribed elavil by another provider last visit for her abdominal pain and she believes this is helping.  She also mentions sleep changes, waking up earlier than usual.      Health Maintenance Due  Topic Date Due   OPHTHALMOLOGY EXAM  Never done   Zoster Vaccines- Shingrix (1 of 2) Never done   COVID-19 Vaccine (3 - Pfizer risk series) 07/06/2019   Medicare Annual Wellness (AWV)  06/09/2023    Past Medical History:   Diagnosis Date   Allergic rhinitis    Allergy    seasonal allergies   Anxiety    CKD (chronic kidney disease) 04/04/2017   COPD (chronic obstructive pulmonary disease) (HCC)    uses inhaler   Depression    Fatty liver    GERD (gastroesophageal reflux disease)    Gout    hx of   Hyperlipidemia    on meds   Hypertension    on meds   Myocardial infarction [facility])    Peptic ulcer 01/04/2003   Vertigo     Past Surgical History:  Procedure Laterality Date   ABDOMINAL HYSTERECTOMY     APPENDECTOMY     BIOPSY  08/13/2020   Procedure: BIOPSY;  Surgeon: Shellia Cleverly, DO;  Location: [facility];  Service: Gastroenterology;;   CORONARY PRESSURE/FFR STUDY N/A 01/27/2022   Procedure: INTRAVASCULAR PRESSURE WIRE/FFR STUDY;  Surgeon: Orbie Pyo, MD;  Location: [facility];  Service: Cardiovascular;  Laterality: N/A;   ESOPHAGOGASTRODUODENOSCOPY (EGD) WITH PROPOFOL N/A 08/13/2020   Procedure: ESOPHAGOGASTRODUODENOSCOPY (EGD) WITH PROPOFOL;  Surgeon: Shellia Cleverly, DO;  Location: [facility];  Service: Gastroenterology;  Laterality: N/A;   LEFT HEART CATH AND CORONARY ANGIOGRAPHY N/A 06/13/2017   Procedure: LEFT HEART CATH AND CORONARY ANGIOGRAPHY;  Surgeon: Marykay Lex, MD;  Location: [facility] INVASIVE CV LAB;  Service: Cardiovascular;  Laterality: N/A;   LEFT HEART CATH AND CORONARY ANGIOGRAPHY N/A 01/27/2022   Procedure: LEFT  HEART CATH AND CORONARY ANGIOGRAPHY;  Surgeon: Orbie Pyo, MD;  Location: [facility] INVASIVE CV LAB;  Service: Cardiovascular;  Laterality: N/A;   RIGHT/LEFT HEART CATH AND CORONARY ANGIOGRAPHY N/A 09/16/2017   Procedure: RIGHT/LEFT HEART CATH AND CORONARY ANGIOGRAPHY;  Surgeon: Swaziland, Peter M, MD;  Location: [facility] INVASIVE CV LAB;  Service: Cardiovascular;  Laterality: N/A;   SAVORY DILATION N/A 08/13/2020   Procedure: SAVORY DILATION;  Surgeon: Shellia Cleverly, DO;  Location: [facility];  Service: Gastroenterology;  Laterality: N/A;   ULTRASOUND  GUIDANCE FOR VASCULAR ACCESS  09/16/2017   Procedure: Ultrasound Guidance For Vascular Access;  Surgeon: Swaziland, Peter M, MD;  Location: [facility] INVASIVE CV LAB;  Service: Cardiovascular;;   WISDOM TOOTH EXTRACTION      Family History  Problem Relation Age of Onset   Breast cancer Mother    Stroke Father    Stomach cancer Neg Hx    Colon cancer Neg Hx    Pancreatic cancer Neg Hx    Esophageal cancer Neg Hx    Colon polyps Neg Hx    Rectal cancer Neg Hx     Social History   Socioeconomic History   Marital status: Widowed    Spouse name: Not on file   Number of children: 1   Years of education: 47   Highest education level: 12th grade  Occupational History   Occupation: Retired  Tobacco Use   Smoking status: Former    Current packs/day: 0.00    Types: Cigarettes    Quit date: 04/24/2000    Years since quitting: 23.0    Passive exposure: Past   Smokeless tobacco: Never  Vaping Use   Vaping status: Never Used  Substance and Sexual Activity   Alcohol use: Not Currently    Comment: has previous hx of ETOH abuse, quit 2014   Drug use: No   Sexual activity: Not Currently  Other Topics Concern   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[place]   Grew up in [place]   Has daughter locally   Social Drivers of Health   Financial Resource Strain: Low Risk  (10/07/2021)   Overall Financial Resource Strain (CARDIA)    Difficulty of Paying Living Expenses: Not very hard  Food Insecurity: Food Insecurity Present (06/09/2022)   Hunger Vital Sign    Worried About Running Out of Food in the Last Year: Sometimes true    Ran Out of Food in the Last Year: Never true  Transportation Needs: Unmet Transportation Needs (10/22/2022)   PRAPARE - Transportation    Lack of Transportation (Medical): Yes    Lack of Transportation (Non-Medical): Yes  Physical Activity: Inactive (05/13/2021)   Exercise Vital Sign    Days of Exercise per Week: 0 days    Minutes of  Exercise per Session: 0 min  Stress: Stress Concern Present (05/13/2021)   [employer] of Occupational Health - Occupational Stress Questionnaire    Feeling of Stress : Rather much  Social Connections: Moderately Isolated (05/13/2021)   Social Connection and Isolation Panel [NHANES]    Frequency of Communication with Friends and Family: More than three times a week    Frequency of Social Gatherings with Friends and Family: More than three times a week    Attends Religious Services: 1 to 4 times per year    Active Member of [employer] or Organizations: No    Attends Banker Meetings: Never    Marital Status:  Widowed  Intimate Partner Violence: Not At Risk (06/09/2022)   Humiliation, Afraid, Rape, and Kick questionnaire    Fear of Current or Ex-Partner: No    Emotionally Abused: No    Physically Abused: No    Sexually Abused: No    Outpatient Medications Prior to Visit  Medication Sig Dispense Refill   albuterol (PROVENTIL) (2.5 MG/3ML) 0.083% nebulizer solution USE 1 VIAL IN NEBULIZER EVERY 6 HOURS AS NEEDED FOR WHEEZING AND FOR SHORTNESS OF BREATH 150 mL 0   albuterol (VENTOLIN HFA) 108 (90 Base) MCG/ACT inhaler Inhale 2 puffs into the lungs every 6 (six) hours as needed for wheezing or shortness of breath. 6.7 g 2   allopurinol (ZYLOPRIM) 100 MG tablet Take 2 tablets (200 mg total) by mouth daily. 180 tablet 0   aspirin 81 MG EC tablet Take 1 tablet (81 mg total) by mouth daily. Swallow whole. 30 tablet 12   atorvastatin (LIPITOR) 80 MG tablet Take 1 tablet (80 mg total) by mouth at bedtime. Please call our office to schedule an overdue appointment with Dr. Servando Salina before anymore refills. [phone] Thank you 3rd and FINAL attempt 15 tablet 0   Blood Glucose Monitoring Suppl (ACCU-CHEK GUIDE ME) w/Device KIT Use to test blood sugar in the morning, at noon, and at bedtime. 1 kit 0   clobetasol ointment (TEMOVATE) 0.05 % Apply 1 Application topically 2 (two) times daily. For up to 2  weeks. 30 g 0   colchicine 0.6 MG tablet Take 0.6 mg by mouth daily as needed (gout).     diazepam (VALIUM) 10 MG tablet Take 1 tablet (10 mg total) by mouth every 12 (twelve) hours as needed for anxiety. 60 tablet 0   dicyclomine (BENTYL) 10 MG capsule Take 1 capsule (10 mg total) by mouth 2 (two) times daily. 60 capsule 5   escitalopram (LEXAPRO) 20 MG tablet Take 1 tablet (20 mg total) by mouth every morning. 90 tablet 0   ezetimibe (ZETIA) 10 MG tablet Take 1 tablet (10 mg total) by mouth daily with lunch. 90 tablet 0   famotidine (PEPCID) 20 MG tablet Take 1 tablet (20 mg total) by mouth in the morning and at bedtime. (8 am & 10 pm) 180 tablet 0   isosorbide mononitrate (IMDUR) 60 MG 24 hr tablet Take 1 tablet (60 mg total) by mouth daily at 6 PM. 90 tablet 0   levothyroxine (SYNTHROID) 25 MCG tablet Take 1 tablet (25 mcg total) by mouth before breakfast. 90 tablet 1   lisinopril (ZESTRIL) 2.5 MG tablet Take 1 tablet (2.5 mg total) by mouth daily with lunch. 90 tablet 0   meclizine (ANTIVERT) 25 MG tablet Take 1 tablet (25 mg total) by mouth 3 (three) times daily as needed for dizziness. 90 tablet 0   metoprolol succinate (TOPROL-XL) 25 MG 24 hr tablet Take 0.5 tablets (12.5 mg total) by mouth every evening. 45 tablet 3   nitroGLYCERIN (NITROSTAT) 0.4 MG SL tablet Place 1 tablet (0.4 mg total) under the tongue every 5 (five) minutes as needed for chest pain. 45 tablet 3   nystatin (MYCOSTATIN/NYSTOP) powder Apply 1 Application topically to the affected area(s) 2 (two) times daily. 60 g 4   potassium chloride SA (KLOR-CON M) 20 MEQ tablet Take 1 tablet (20 mEq total) by mouth daily at 12 noon. 90 tablet 0   Probiotic Product (PROBIOTIC DAILY) CAPS Take 1 tablet by mouth daily. 90 capsule 0   Respiratory Therapy Supplies (NEBULIZER/TUBING/MOUTHPIECE) KIT Use as directed  1 kit 0   sucralfate (CARAFATE) 1 g tablet Take 1 tablet (1 g total) by mouth daily. Take one hour prior to morning medications.  30 tablet 0   amitriptyline (ELAVIL) 10 MG tablet Take 1 tablet (10 mg total) by mouth at bedtime. 30 tablet 1   valACYclovir (VALTREX) 500 MG tablet Take 1 tablet (500 mg total) by mouth every other day. 45 tablet 1   valACYclovir (VALTREX) 500 MG tablet Take 1 tablet (500 mg total) by mouth every other day. 45 tablet 1   No facility-administered medications prior to visit.    Allergies  Allergen Reactions   Ibuprofen Other (See Comments)    Pt has hx of peptic ulcer and diverticulitis.     ROS See HPI    Objective:    Physical Exam Constitutional:      General: She is not in acute distress.    Appearance: Normal appearance. She is well-developed.  HENT:     Head: Normocephalic and atraumatic.     Right Ear: External ear normal.     Left Ear: External ear normal.  Eyes:     General: No scleral icterus. Neck:     Thyroid: No thyromegaly.  Cardiovascular:     Rate and Rhythm: Normal rate and regular rhythm.     Heart sounds: Normal heart sounds. No murmur heard. Pulmonary:     Effort: Pulmonary effort is normal. No respiratory distress.     Breath sounds: Normal breath sounds. No wheezing.  Musculoskeletal:     Cervical back: Neck supple.  Skin:    General: Skin is warm and dry.  Neurological:     Mental Status: She is alert and oriented to person, place, and time.  Psychiatric:        Mood and Affect: Mood normal.        Behavior: Behavior normal.        Thought Content: Thought content normal.        Judgment: Judgment normal.      BP (!) 122/47 (BP Location: Right Arm, Patient Position: Sitting, Cuff Size: Large)   Pulse 65   Temp 98.4 F (36.9 C) (Oral)   Resp 16   Ht 5\' 4"  (1.626 m)   Wt 202 lb (91.6 kg)   SpO2 97%   BMI 34.67 kg/m  Wt Readings from Last 3 Encounters:  04/22/23 202 lb (91.6 kg)  03/21/23 197 lb 6.4 oz (89.5 kg)  02/24/23 197 lb 2 oz (89.4 kg)       Assessment & Plan:   Problem List Items Addressed This Visit        Unprioritized   Hypothyroid   Lab Results  Component Value Date   TSH 3.29 11/17/2022   She is maintained on synthroid, update tsh.        Relevant Orders   TSH   Hypertension - Primary   BP Readings from Last 3 Encounters:  04/22/23 (!) 122/47  02/24/23 126/72  11/17/22 115/60   BP stable on lisinopril. Continue same.       HLD (hyperlipidemia)   Lab Results  Component Value Date   CHOL 134 07/26/2022   HDL 43.00 07/26/2022   LDLCALC 62 07/26/2022   TRIG 148.0 07/26/2022   CHOLHDL 3 07/26/2022   Stable, maintained on lipitor. Update lipid panel.       Relevant Orders   Lipid panel   Herpes simplex type 2 infection   Uncontrolled off of suppressive valtrex- ran out.  Send refill. Pt to restart.       Relevant Medications   valACYclovir (VALTREX) 500 MG tablet   Epigastric pain   Chronic.  Some improvement with addition of elavil. Continue same, refills sent.       Depression with anxiety   Depression is uncontrolled. Despite lexapro. I really think her biggest issue is social isolation and lack of family support. I would like to refer her to SW for some counseling and help getting linked up with community resources.       Relevant Medications   amitriptyline (ELAVIL) 10 MG tablet   Other Relevant Orders   AMB Referral [facility]I Care Management   Controlled type 2 diabetes mellitus with complication, without long-term current use of insulin (HCC)   Lab Results  Component Value Date   HGBA1C 6.7 (H) 11/17/2022   HGBA1C 6.7 (H) 02/05/2022   HGBA1C 6.5 01/09/2021   Lab Results  Component Value Date   MICROALBUR 1.4 07/28/2022   LDLCALC 62 07/26/2022   CREATININE 1.39 (H) 02/24/2023         Relevant Orders   HgB A1c   Comp Met (CMET)   Chronic upper abdominal pain   This has been present for some time and has been extensively evaluated by GI. Noting some improvement with Elavil, will refill/continue.       Relevant Medications   amitriptyline (ELAVIL)  10 MG tablet   Other Visit Diagnoses       Needs flu shot       Relevant Orders   Flu Vaccine Trivalent High Dose (Fluad) (Completed)       I am having Veronica Wolf "Elease Hashimoto" maintain her colchicine, aspirin EC, nitroGLYCERIN, albuterol, Nebulizer/Tubing/Mouthpiece, Accu-Chek Guide Me, metoprolol succinate, nystatin, clobetasol ointment, albuterol, Probiotic Daily, levothyroxine, meclizine, potassium chloride SA, isosorbide mononitrate, allopurinol, escitalopram, ezetimibe, lisinopril, dicyclomine, diazepam, famotidine, sucralfate, atorvastatin, valACYclovir, and amitriptyline.  Meds ordered this encounter  Medications   valACYclovir (VALTREX) 500 MG tablet    Sig: Take 1 tablet (500 mg total) by mouth every other day.    Dispense:  45 tablet    Refill:  1    Supervising Provider:   Danise Edge A [[id number]]   amitriptyline (ELAVIL) 10 MG tablet    Sig: Take 1 tablet (10 mg total) by mouth at bedtime.    Dispense:  90 tablet    Refill:  0    Supervising Provider:   Danise Edge A [[id number]]

## 2023-04-22 NOTE — Assessment & Plan Note (Addendum)
Depression is uncontrolled. Despite lexapro. I really think her biggest issue is social isolation and lack of family support. I would like to refer her to SW for some counseling and help getting linked up with community resources.

## 2023-04-22 NOTE — Assessment & Plan Note (Signed)
This has been present for some time and has been extensively evaluated by GI. Noting some improvement with Elavil, will refill/continue.

## 2023-04-22 NOTE — Patient Instructions (Signed)
VISIT SUMMARY:  During today's visit, we discussed your worsening depressive symptoms, ongoing abdominal pain, and recurrence of herpes symptoms. We also reviewed your general health maintenance and administered a high-dose influenza vaccine.  YOUR PLAN:  -DEPRESSION: Depression is a mental health condition characterized by persistent feelings of sadness and loss of interest. Your symptoms have worsened due to social isolation and lack of transportation. We will continue your current dose of escitalopram. I recommend reaching out to your local church for potential transportation assistance and will refer you to a social worker to help connect you with senior services and community resources.  -CHRONIC ABDOMINAL PAIN: Chronic abdominal pain is ongoing pain in the abdomen that can have various causes. Despite your current treatment with amitriptyline, you are still experiencing pain. We will continue your amitriptyline and provide a refill. Additionally, I have ordered labs to further evaluate the cause of your abdominal pain.  -HERPES: Herpes is a viral infection that can cause sores and other symptoms. You have reported a recurrence of your herpes symptoms. I will provide a refill for your antiviral medication.  -GENERAL HEALTH MAINTENANCE: We administered a high-dose influenza vaccine today to help protect you from the flu. It is important to stay up-to-date with vaccinations and routine health checks.  INSTRUCTIONS:  Please schedule a follow-up appointment in three months. Additionally, reach out to your local church for potential transportation assistance and expect a referral to a social worker who will help connect you with senior services and community resources.

## 2023-04-22 NOTE — Assessment & Plan Note (Signed)
Lab Results  Component Value Date   CHOL 134 07/26/2022   HDL 43.00 07/26/2022   LDLCALC 62 07/26/2022   TRIG 148.0 07/26/2022   CHOLHDL 3 07/26/2022   Stable, maintained on lipitor. Update lipid panel.

## 2023-04-22 NOTE — Assessment & Plan Note (Signed)
BP Readings from Last 3 Encounters:  04/22/23 (!) 122/47  02/24/23 126/72  11/17/22 115/60   BP stable on lisinopril. Continue same.

## 2023-04-23 ENCOUNTER — Other Ambulatory Visit (HOSPITAL_COMMUNITY): Payer: Self-pay

## 2023-04-23 ENCOUNTER — Telehealth: Payer: Self-pay | Admitting: Family

## 2023-04-23 DIAGNOSIS — N1832 Chronic kidney disease, stage 3b: Secondary | ICD-10-CM

## 2023-04-23 MED ORDER — ATORVASTATIN CALCIUM 80 MG PO TABS
80.0000 mg | ORAL_TABLET | Freq: Every day | ORAL | 1 refills | Status: DC
  Filled 2023-04-23 – 2023-05-03 (×8): qty 90, 90d supply, fill #0
  Filled 2023-05-03: qty 30, 30d supply, fill #0

## 2023-04-23 NOTE — Telephone Encounter (Signed)
Cholesterol has gone up more than 100 points.  I bet she accidentally stopped taking atorvastatin. I sent a refill- please restart.   Sugar is at goal.  Kidney function is reduced but stable. Did she ever get into see nephrology?  We referred her previously.  I have pended a new referral.

## 2023-04-25 ENCOUNTER — Other Ambulatory Visit: Payer: Self-pay

## 2023-04-25 ENCOUNTER — Other Ambulatory Visit (HOSPITAL_COMMUNITY): Payer: Self-pay

## 2023-04-25 ENCOUNTER — Telehealth: Payer: Self-pay | Admitting: *Deleted

## 2023-04-25 NOTE — Telephone Encounter (Signed)
Patient was notified of results and new prescription. She reports she has not been in to see nephrology. Referral was sent

## 2023-04-25 NOTE — Progress Notes (Signed)
Complex Care Management Note  Care Guide Note 04/25/2023 Name: Veronica Wolf MRN: [id number] DOB: 10/21/1940  Veronica Wolf is a 83 y.o. year old female who sees [name], NP for primary care. I reached out to [name] by phone today to offer complex care management services.  [name] was given information about Complex Care Management services today including:   The Complex Care Management services include support from the care team which includes your Nurse Coordinator, Clinical Social Worker, or Pharmacist.  The Complex Care Management team is here to help remove barriers to the health concerns and goals most important to you. Complex Care Management services are voluntary, and the patient may decline or stop services at any time by request to their care team member.   Complex Care Management Consent Status: Patient agreed to services and verbal consent obtained.   Follow up plan:  Telephone appointment with complex care management team member scheduled for:  04/26/2023  Encounter Outcome:  Patient Scheduled  Burman Nieves, CMA, Care Guide [facility]  [facility], [facility] Guide Direct Dial: [phone]  Fax: [phone] Website: [email or web address]

## 2023-04-26 ENCOUNTER — Other Ambulatory Visit (HOSPITAL_COMMUNITY): Payer: Self-pay

## 2023-04-26 ENCOUNTER — Telehealth: Payer: Self-pay | Admitting: Family

## 2023-04-26 ENCOUNTER — Other Ambulatory Visit: Payer: Self-pay

## 2023-04-26 ENCOUNTER — Encounter: Payer: Self-pay | Admitting: Licensed Clinical Social Worker

## 2023-04-26 NOTE — Telephone Encounter (Signed)
Copied from CRM [id number]. Topic: General - Other >> Apr 26, 2023  7:44 AM [place] A wrote: Reason for CRM: Patient called because she is not feeling well. Patient said she wants to cancel appt with Social Worker that is scheduled for today. Agent offered to get a nurse on the line for patient to speak with since she said she was coughing continuously and not able to sleep. Patient declined nurse services. Agent offered to schedule doctor appointment for her coughing patient declined and said her daughter was buying her cough medicine. Agent provided patient office number inc case she wants to call back to schedule medical appointment.

## 2023-04-26 NOTE — Telephone Encounter (Signed)
Called patient and she reports she does not need an appointment at this time. She will get otc cough medication. Patient advised if symptoms do not improve she can contact the office for an appointment.

## 2023-04-27 ENCOUNTER — Other Ambulatory Visit (HOSPITAL_COMMUNITY): Payer: Self-pay

## 2023-04-28 ENCOUNTER — Other Ambulatory Visit (HOSPITAL_COMMUNITY): Payer: Self-pay

## 2023-04-28 ENCOUNTER — Other Ambulatory Visit: Payer: Self-pay

## 2023-04-29 ENCOUNTER — Other Ambulatory Visit (HOSPITAL_COMMUNITY): Payer: Self-pay

## 2023-04-30 ENCOUNTER — Other Ambulatory Visit (HOSPITAL_COMMUNITY): Payer: Self-pay

## 2023-05-02 ENCOUNTER — Other Ambulatory Visit (HOSPITAL_COMMUNITY): Payer: Self-pay

## 2023-05-02 ENCOUNTER — Other Ambulatory Visit: Payer: Self-pay

## 2023-05-03 ENCOUNTER — Other Ambulatory Visit: Payer: Self-pay

## 2023-05-03 ENCOUNTER — Other Ambulatory Visit (HOSPITAL_COMMUNITY): Payer: Self-pay

## 2023-05-04 ENCOUNTER — Other Ambulatory Visit (HOSPITAL_COMMUNITY): Payer: Self-pay

## 2023-05-06 ENCOUNTER — Telehealth: Payer: Self-pay | Admitting: Emergency Medicine

## 2023-05-06 NOTE — Telephone Encounter (Signed)
Copied from CRM [id number]. Topic: Clinical - Medication Question >> May 06, 2023 10:35 AM Almira Coaster wrote: Reason for CRM: Patient is calling because they delivered two boxes of medication to the patient with times and dates on how to take them. She usually receives one box. She is unaware of what to do. Does she use both, or what medication she should be taking. She's confused and would like to speak with [name] or the nurse. Mrs.[place] number is [phone]

## 2023-05-06 NOTE — Telephone Encounter (Signed)
Patient reports she received 2 big boxes with medication and is trying to figure out if she needs to take and how.  I advised her we do not send medication out and she needs to have her daughter review the information on the boxes to see what this are. She reports she is not out of her regular medications.

## 2023-05-09 ENCOUNTER — Other Ambulatory Visit: Payer: Self-pay

## 2023-05-09 ENCOUNTER — Encounter: Payer: Self-pay | Admitting: Licensed Clinical Social Worker

## 2023-05-09 ENCOUNTER — Other Ambulatory Visit (HOSPITAL_COMMUNITY): Payer: Self-pay

## 2023-05-09 ENCOUNTER — Other Ambulatory Visit: Payer: Self-pay | Admitting: Family

## 2023-05-09 ENCOUNTER — Ambulatory Visit: Payer: Self-pay | Admitting: Licensed Clinical Social Worker

## 2023-05-09 DIAGNOSIS — G8929 Other chronic pain: Secondary | ICD-10-CM

## 2023-05-09 MED ORDER — METOPROLOL SUCCINATE ER 25 MG PO TB24
12.5000 mg | ORAL_TABLET | Freq: Every evening | ORAL | 1 refills | Status: DC
  Filled 2023-05-09: qty 45, 90d supply, fill #0
  Filled 2023-07-07 – 2023-07-20 (×3): qty 45, 90d supply, fill #1

## 2023-05-09 NOTE — Telephone Encounter (Signed)
Requesting: diazepam 10mg   Contract: 07/26/22 UDS: 07/26/22 Last Visit: 04/22/23 Next Visit: None Last Refill: 03/31/23 #60 and 0RF   Please Advise

## 2023-05-09 NOTE — Patient Outreach (Signed)
  Care Coordination   Initial Visit Note   05/09/2023 Name: Veronica Wolf MRN: [id number] DOB: 13-Jan-1941  Veronica Wolf is a 83 y.o. year old female who sees [name], NP for primary care. I spoke with  [name] by phone today.  What matters to the patients health and wellness today?  Accessing transportation assistance in her area.    Goals Addressed             This Visit's Progress    Increase Access to communtiy/Mental Health Resources       Care Coordination Interventions: Assessed Social Determinants of Health Reviewed all upcoming appointments in Epic system Depression screen reviewed  Active listening / Reflection utilized  Emotional Support Provided Problem Solving /Task Center strategies reviewed CSW will complete referral to [facility] Access Reviewed [facility] options and gave information on activities/resources.          SDOH assessments and interventions completed:  Yes  SDOH Interventions Today    Flowsheet Row Most Recent Value  SDOH Interventions   Food Insecurity Interventions Other (Comment)  Housing Interventions Intervention Not Indicated  Transportation Interventions Other (Comment)  [[facility] Access]  Utilities Interventions Intervention Not Indicated        Care Coordination Interventions:  Yes, provided   Interventions Today    Flowsheet Row Most Recent Value  Chronic Disease   Chronic disease during today's visit Other  [Depression]  General Interventions   General Interventions Discussed/Reviewed [facility], General Interventions Discussed  [Pt reported much of her depression comes from isolation. Pt lacks transportation and does not have much family support. Pt was agreeable to [facility] access for transportation - CSW will complete application - reviewed process.]  Mental Health Interventions   Mental Health Discussed/Reviewed Mental Health Discussed, Mental Health Reviewed, Depression, Coping Strategies  [Pt  reported strong feelings of depression regarding feelings of isolation and lonliness. Pt has family in the area but is unable to see them much. Also misses church as a [employer]. We reviewed her coping skills and supports she can lean on]  Pharmacy Interventions   Pharmacy Dicussed/Reviewed Pharmacy Topics Discussed  [Pt reported having medications she needed refilled - CSW assisted pt with contacting PCP office to put in medication request.]        Follow up plan: Follow up call scheduled for 05/23/2023    Encounter Outcome:  Patient Visit Completed   Veronica Kingfisher, LCSW [place], [facility] Licensed Clinical Social Worker Care Coordinator [phone]

## 2023-05-09 NOTE — Patient Instructions (Signed)
Visit Information  Thank you for taking time to visit with me today. Please don't hesitate to contact me if I can be of assistance to you.   Following are the goals we discussed today:   Goals Addressed             This Visit's Progress    Increase Access to communtiy/Mental Health Resources       Care Coordination Interventions: Assessed Social Determinants of Health Reviewed all upcoming appointments in Epic system Depression screen reviewed  Active listening / Reflection utilized  Emotional Support Provided Problem Solving /Task Center strategies reviewed CSW will complete referral to [facility] Access Reviewed [facility] options and gave information on activities/resources.          Our next appointment is by telephone on 05/23/2023.  Please call the care guide team at [phone] if you need to cancel or reschedule your appointment.   If you are experiencing a Mental Health or Behavioral Health Crisis or need someone to talk to, please call the Suicide and Crisis Lifeline: 988  Patient verbalizes understanding of instructions and care plan provided today and agrees to view in MyChart. Active MyChart status and patient understanding of how to access instructions and care plan via MyChart confirmed with patient.     Telephone follow up appointment with care management team member scheduled for: 05/23/2023

## 2023-05-09 NOTE — Telephone Encounter (Unsigned)
Copied from CRM [id number]. Topic: Clinical - Medication Refill >> May 09, 2023  3:06 PM Louie Boston wrote: Most Recent Primary Care Visit:  Provider: O'SULLIVAN, MELISSA  Department: [facility]  Visit Type: OFFICE VISIT  Date: 04/22/2023  Medication: amitriptyline (ELAVIL) 10 MG tablet, metoprolol succinate (TOPROL-XL) 25 MG 24 hr tablet  Has the patient contacted their pharmacy? No (Agent: If no, request that the patient contact the pharmacy for the refill. If patient does not wish to contact the pharmacy document the reason why and proceed with request.) (Agent: If yes, when and what did the pharmacy advise?)  Patient did not reach out to pharmacy regarding refills but patient will contact to check.   Is this the correct pharmacy for this prescription? Yes If no, delete pharmacy and type the correct one.  This is the patient's preferred pharmacy:  [facility] [address] [place] [address] Phone: [phone] Fax: [phone]   Has the prescription been filled recently? No  Is the patient out of the medication? No  Has the patient been seen for an appointment in the last year OR does the patient have an upcoming appointment? Yes  Can we respond through MyChart? No  Agent: Please be advised that Rx refills may take up to 3 business days. We ask that you follow-up with your pharmacy.

## 2023-05-09 NOTE — Telephone Encounter (Signed)
Last Fill: Elavil: 04/22/23     Metoprolol: 10/08/22     Diazepam: 03/31/23  Last OV: 04/22/23 Next OV: None Scheduled  Routing to provider for review/authorization.

## 2023-05-10 ENCOUNTER — Other Ambulatory Visit: Payer: Self-pay

## 2023-05-10 ENCOUNTER — Other Ambulatory Visit (HOSPITAL_COMMUNITY): Payer: Self-pay

## 2023-05-10 MED ORDER — DIAZEPAM 10 MG PO TABS
10.0000 mg | ORAL_TABLET | Freq: Two times a day (BID) | ORAL | 0 refills | Status: DC | PRN
  Filled 2023-05-10: qty 60, 30d supply, fill #0

## 2023-05-11 ENCOUNTER — Other Ambulatory Visit (HOSPITAL_COMMUNITY): Payer: Self-pay

## 2023-05-12 ENCOUNTER — Other Ambulatory Visit (HOSPITAL_COMMUNITY): Payer: Self-pay

## 2023-05-18 ENCOUNTER — Telehealth: Payer: Self-pay | Admitting: Family

## 2023-05-18 ENCOUNTER — Ambulatory Visit (INDEPENDENT_AMBULATORY_CARE_PROVIDER_SITE_OTHER): Payer: Medicare Other

## 2023-05-18 DIAGNOSIS — Z79899 Other long term (current) drug therapy: Secondary | ICD-10-CM

## 2023-05-18 DIAGNOSIS — E782 Mixed hyperlipidemia: Secondary | ICD-10-CM

## 2023-05-18 DIAGNOSIS — I10 Essential (primary) hypertension: Secondary | ICD-10-CM

## 2023-05-18 NOTE — Progress Notes (Unsigned)
03/23/2023 Name: Veronica Wolf MRN: [id number] DOB: 05/15/1940  No chief complaint on file.   Veronica Wolf is a 83 y.o. year old female who was referred for medication management by their primary care provider, [name], NP. They presented for a phone visit initially but patient asked for home visit to assist with medications.    They were referred to the pharmacist by their PCP for assistance in managing complex medication management    Subjective:  Medication Access/Adherence  Current Pharmacy:  [facility] [address] [place] [address] Phone: [phone] Fax: [phone]  Patient reports affordability concerns with their medications: No  Patient reports access/transportation concerns to their pharmacy:  medications are delivered to her - if needed her daughter can pick up medications Patient reports adherence concerns with their medications:  Yes  -    Medication Management:  Current adherence strategy: usually uses adherence packaging but the day following her last package start date, she had changes to her medications.  Two weeks ago I assisted patient in transferring packaged medications into a weekly pill reminder box (removed extra tablets of dicyclomine and sucralfate)  Since that visit patient was seen Monday 12/16 by Dr Carmelia Roller and was prescribed amitriptyline to take 1 tablet at night. Patient did pick up this Rx from [facility] but has not started yet. She was unsure about when to take it.   Last packaging schedule Morning: Package 1:  levothyroxine, allopurinol (#2), pantoprazole, famotidine, dicyclomine, escitalopram Package 2: sucralfate Noon: potassium, ezetimibe, lisinopril Evening: isosorbide Bedtime: dicyclomine, famotide, amitriptyline no longer in packaging - atorvastatin  She take the following medications which are not included in her packaged medication:  aspirin 81mg   daily at night, metoprolol ER 25mg  0.5 tablet at night, diazepam as needed and valacyclovir every other day . Patient also now has atorvastatin 80mg  in a bottle. Patient keeps these medications by her bed to take prior to sleep because they cannot be included in packaging or is an as needed medication.  Patient reports Poor adherence to medications. She states she received two adherence boxes and she was not sure which one she was supposed to take.  Patient reports the following barriers to adherence: She has difficulty opening adherence packages and sometimes tears the next package and loses pills. She has a hard time seeing the numbers and print on the medication packages.   Objective:  Lab Results  Component Value Date   HGBA1C 6.8 (H) 04/22/2023    Lab Results  Component Value Date   CREATININE 1.49 (H) 04/22/2023   BUN 11 04/22/2023   NA 142 04/22/2023   K 4.4 04/22/2023   CL 106 04/22/2023   CO2 29 04/22/2023    Lab Results  Component Value Date   CHOL 242 (H) 04/22/2023   HDL 45.50 04/22/2023   LDLCALC 153 (H) 04/22/2023   TRIG 217.0 (H) 04/22/2023   CHOLHDL 5 04/22/2023    Medications Reviewed Today   Medications were not reviewed in this encounter       Assessment/Plan:   Medication Management: - Assisted patient in organizing pill packets (since she removed from adherence box). Wrote day, date and time for each package for her so that she can see information better. Suggested she get a magnifier to use so that she can read package better in the future.  - Will coordinate with pharmacy for next adherence packaging. Will add atorvastatin back to her packaging.  Also will see if we can arrange so that she will only have 1 package in morning, 1 at  noon and 1 at night.   - Recommend the following regimen when her next packaging is due Morning 8am: sucralfate,  levothyroxine, famotidine, dicyclomine, allopurinol Lunch- 12pm / noon:  potassium, ezetimibe, lisinopril,  escitalopram, allopurinol #2 tabs Evening 6pm: isosorbide, dicyclomine, famotidine, atorvastatin, amitriptyline  Night - Continue to take aspirin 81mg  daily at night, metoprolol ER 25mg  0.5 tablet at night, amitriptyline 10mg  at bedtime and diazepam as needed and valacyclovir every other day at night (these are not included in her packaging. Continue to keep by your bed to remember to take at bedtime.   Recommended schedule for packaging with recent changes (trying to use as few packages per day as possible because multiple package scheduled for the same time of day has confused patient in the past)   Follow Up Plan: 1 week, after patient received next adherence packaging current packaging is thru 05/27/2023)   [name], PharmD Clinical Pharmacist [facility]

## 2023-05-18 NOTE — Telephone Encounter (Signed)
Copied from CRM [id number]. Topic: Clinical - Medication Question >> May 18, 2023  1:40 PM Clayton Bibles wrote: Reason for CRM: She needs help sorting out her medications. She is very confused about what to do.  Please call her at [phone]

## 2023-05-18 NOTE — Telephone Encounter (Signed)
Patient requests home appointment with Clinical Pharmacist Practitioner to review her medications. She states she has 2 med adherence boxes at home and she is confused.  Appointment made for today at 4:15pm

## 2023-05-18 NOTE — Telephone Encounter (Signed)
Pharmacist, [name] will assist patient with her medications

## 2023-05-20 ENCOUNTER — Other Ambulatory Visit (HOSPITAL_COMMUNITY): Payer: Self-pay

## 2023-05-20 ENCOUNTER — Other Ambulatory Visit: Payer: Self-pay | Admitting: Family

## 2023-05-20 ENCOUNTER — Other Ambulatory Visit: Payer: Self-pay | Admitting: Family Medicine

## 2023-05-20 ENCOUNTER — Other Ambulatory Visit: Payer: Self-pay

## 2023-05-20 ENCOUNTER — Other Ambulatory Visit: Payer: Self-pay | Admitting: Nurse Practitioner

## 2023-05-20 DIAGNOSIS — G8929 Other chronic pain: Secondary | ICD-10-CM

## 2023-05-20 MED ORDER — ESCITALOPRAM OXALATE 20 MG PO TABS
20.0000 mg | ORAL_TABLET | Freq: Every day | ORAL | 1 refills | Status: DC
  Filled 2023-05-20: qty 90, 90d supply, fill #0
  Filled 2023-05-23: qty 30, 30d supply, fill #0
  Filled 2023-06-09 – 2023-06-16 (×3): qty 30, 30d supply, fill #1
  Filled 2023-07-07 – 2023-07-12 (×2): qty 30, 30d supply, fill #2
  Filled 2023-07-26 – 2023-08-12 (×3): qty 30, 30d supply, fill #3
  Filled 2023-08-31 – 2023-09-06 (×2): qty 30, 30d supply, fill #4
  Filled 2023-10-06 (×2): qty 30, 30d supply, fill #5

## 2023-05-20 MED ORDER — AMITRIPTYLINE HCL 10 MG PO TABS
10.0000 mg | ORAL_TABLET | Freq: Every day | ORAL | 1 refills | Status: DC
Start: 2023-05-20 — End: 2023-06-06
  Filled 2023-05-20 – 2023-05-25 (×4): qty 90, 90d supply, fill #0
  Filled 2023-05-25: qty 30, 30d supply, fill #0
  Filled 2023-05-26 – 2023-05-27 (×2): qty 90, 90d supply, fill #0
  Filled 2023-05-28: qty 30, 30d supply, fill #0
  Filled 2023-05-30 – 2023-05-31 (×2): qty 90, 90d supply, fill #0
  Filled 2023-06-03 (×2): qty 30, 30d supply, fill #0

## 2023-05-20 MED ORDER — LEVOTHYROXINE SODIUM 25 MCG PO TABS
25.0000 ug | ORAL_TABLET | Freq: Every morning | ORAL | 1 refills | Status: DC
  Filled 2023-05-20: qty 90, 90d supply, fill #0
  Filled 2023-05-23: qty 30, 30d supply, fill #0
  Filled 2023-06-09 – 2023-06-16 (×3): qty 30, 30d supply, fill #1
  Filled 2023-07-07 – 2023-07-12 (×2): qty 30, 30d supply, fill #2
  Filled 2023-07-26 – 2023-08-12 (×3): qty 30, 30d supply, fill #3
  Filled 2023-08-31 – 2023-09-06 (×2): qty 30, 30d supply, fill #4
  Filled 2023-10-06 (×2): qty 30, 30d supply, fill #5

## 2023-05-20 MED ORDER — POTASSIUM CHLORIDE CRYS ER 20 MEQ PO TBCR
20.0000 meq | EXTENDED_RELEASE_TABLET | Freq: Every day | ORAL | 1 refills | Status: DC
  Filled 2023-05-20: qty 90, 90d supply, fill #0
  Filled 2023-05-23: qty 30, 30d supply, fill #0
  Filled 2023-06-09 – 2023-06-16 (×3): qty 30, 30d supply, fill #1
  Filled 2023-07-07 – 2023-07-12 (×2): qty 30, 30d supply, fill #2
  Filled 2023-07-26 – 2023-08-12 (×3): qty 30, 30d supply, fill #3
  Filled 2023-08-31 – 2023-09-06 (×2): qty 30, 30d supply, fill #4
  Filled 2023-10-06 (×2): qty 30, 30d supply, fill #5

## 2023-05-20 MED ORDER — SUCRALFATE 1 G PO TABS
1.0000 g | ORAL_TABLET | Freq: Every day | ORAL | 9 refills | Status: AC
Start: 2023-05-20 — End: ?
  Filled 2023-05-20 – 2023-05-23 (×2): qty 30, 30d supply, fill #0
  Filled 2023-06-09 – 2023-06-16 (×3): qty 30, 30d supply, fill #1
  Filled 2023-07-07 – 2023-07-12 (×2): qty 30, 30d supply, fill #2

## 2023-05-20 MED ORDER — ATORVASTATIN CALCIUM 80 MG PO TABS
80.0000 mg | ORAL_TABLET | Freq: Every day | ORAL | 1 refills | Status: DC
  Filled 2023-05-20: qty 90, 90d supply, fill #0
  Filled 2023-05-23: qty 30, 30d supply, fill #0
  Filled 2023-06-09 – 2023-06-16 (×3): qty 30, 30d supply, fill #1
  Filled 2023-07-07 – 2023-07-12 (×2): qty 30, 30d supply, fill #2
  Filled 2023-07-26 – 2023-08-12 (×3): qty 30, 30d supply, fill #3
  Filled 2023-08-31 – 2023-09-06 (×2): qty 30, 30d supply, fill #4
  Filled 2023-10-06 (×2): qty 30, 30d supply, fill #5

## 2023-05-23 ENCOUNTER — Other Ambulatory Visit: Payer: Self-pay

## 2023-05-23 ENCOUNTER — Telehealth: Payer: Self-pay | Admitting: Pharmacist

## 2023-05-23 ENCOUNTER — Other Ambulatory Visit (HOSPITAL_BASED_OUTPATIENT_CLINIC_OR_DEPARTMENT_OTHER): Payer: Self-pay

## 2023-05-23 ENCOUNTER — Ambulatory Visit: Payer: Self-pay | Admitting: Licensed Clinical Social Worker

## 2023-05-23 NOTE — Patient Outreach (Signed)
  Care Coordination   Follow Up Visit Note   05/23/2023 Name: STEFANIA GOULART MRN: [id number] DOB: July 03, 1940  THERSIA PETRAGLIA is a 83 y.o. year old female who sees [name], NP for primary care. I spoke with  [name] by phone today.  What matters to the patients health and wellness today?  Increasing access to community transportation.    Goals Addressed             This Visit's Progress    Increase Access to communtiy/Mental Health Resources       Care Coordination Interventions: Assessed Social Determinants of Health Reviewed all upcoming appointments in Epic system Depression screen reviewed  Active listening / Reflection utilized  Emotional Support Provided Problem Solving /Task Center strategies reviewed Look for letter from [employer] regarding approved transportation Reviewed [employer] options and gave information on activities/resources.          SDOH assessments and interventions completed:  Yes     Care Coordination Interventions:  Yes, provided   Interventions Today    Flowsheet Row Most Recent Value  Chronic Disease   Chronic disease during today's visit Other  [Depression]  General Interventions   General Interventions Discussed/Reviewed [employer] spoke with [place] [facility] regarding pt's transportation application. They received application and it has been approved - they will mail letter of approval/instructions for use in the mail to pt. CSW will update pt.]  Mental Health Interventions   Mental Health Discussed/Reviewed Mental Health Discussed, Mental Health Reviewed        Follow up plan: Follow up call scheduled for 06/06/2023    Encounter Outcome:  Patient Visit Completed   Kenton Kingfisher, LCSW [place], [facility] Licensed Clinical Social Worker Care Coordinator [phone]

## 2023-05-23 NOTE — Telephone Encounter (Signed)
 Patient called today with questions about the "tiny pink" tablet that she takes at night. She has spilled that bottle / cannot find that bottle.  She is referring to amitriptyline. Reminded her that the pharmacy started putting amitriptyline in her evening packages this month so that is why she does not have a bottle of amitriptyline / "little pink" tablets.  Patient voiced that she understood / remembered that this had changed.

## 2023-05-23 NOTE — Patient Instructions (Signed)
 Visit Information  Thank you for taking time to visit with me today. Please don't hesitate to contact me if I can be of assistance to you.   Following are the goals we discussed today:   Goals Addressed             This Visit's Progress    Increase Access to communtiy/Mental Health Resources       Care Coordination Interventions: Assessed Social Determinants of Health Reviewed all upcoming appointments in Epic system Depression screen reviewed  Active listening / Reflection utilized  Emotional Support Provided Problem Solving /Task Center strategies reviewed Look for letter from [employer] regarding approved transportation Reviewed [employer] options and gave information on activities/resources.          Our next appointment is by telephone on 06/06/2023.  Please call the care guide team at [phone] if you need to cancel or reschedule your appointment.   If you are experiencing a Mental Health or Behavioral Health Crisis or need someone to talk to, please call the Suicide and Crisis Lifeline: 988  Patient verbalizes understanding of instructions and care plan provided today and agrees to view in MyChart. Active MyChart status and patient understanding of how to access instructions and care plan via MyChart confirmed with patient.     Telephone follow up appointment with care management team member scheduled for: 06/06/2023

## 2023-05-24 ENCOUNTER — Other Ambulatory Visit: Payer: Self-pay

## 2023-05-25 ENCOUNTER — Ambulatory Visit (INDEPENDENT_AMBULATORY_CARE_PROVIDER_SITE_OTHER): Payer: Medicare Other | Admitting: Pharmacist

## 2023-05-25 ENCOUNTER — Other Ambulatory Visit: Payer: Self-pay

## 2023-05-25 DIAGNOSIS — Z79899 Other long term (current) drug therapy: Secondary | ICD-10-CM

## 2023-05-25 NOTE — Progress Notes (Signed)
 05/18/2023 Name: Veronica Wolf MRN: [id number] DOB: [birth year] No chief complaint on file.   Veronica Wolf is a 83 y.o. year old female who was referred for medication management by their primary care provider, [name], NP.       Subjective:  Medication Access/Adherence  Current Pharmacy:  [facility] [address] [place] [address] Phone: [phone] Fax: [phone]  Patient reports affordability concerns with their medications: No  Patient reports access/transportation concerns to their pharmacy:  medications are delivered to her - if needed her daughter can pick up medications Patient reports adherence concerns with their medications:  Yes  - she is unsure which adherence box she is supposed to be using.    Medication Management:  Current adherence strategy: uses adherence packaging - she has received a new box of packaged meds this week. She is due to start new packages 05/27/2023 - per patient.  New packaging should have this schedule:  Morning 8am: sucralfate,  levothyroxine, famotidine, dicyclomine, escitalopram, (ideally we would separate sucralfate from levothyroxine but patient has been taking together for 6 months and last TSH 04/2023 was WNL / stable) Lunch- 12pm / noon:  potassium, ezetimibe, lisinopril, allopurinol #2 tabs Night / 10pm: isosorbide, dicyclomine, famotidine, atorvastatin, amitriptyline  She takes the following medications which are not included in her packaged medication:  aspirin 81mg  daily at night, metoprolol ER 25mg  0.5 tablet at night, diazepam as needed and valacyclovir every other night. Patient keeps these medications by her bed to take because they cannot be included in packaging or it's an as needed medication.  Patient reports improved adherence to medications over the last week. .  Patient reports the following barriers to adherence: She has difficulty opening adherence packages and  sometimes tears the next package and loses pills. She has a hard time seeing the numbers and print on the medication packages.   Objective:  Lab Results  Component Value Date   HGBA1C 6.8 (H) 04/22/2023    Lab Results  Component Value Date   CREATININE 1.49 (H) 04/22/2023   BUN 11 04/22/2023   NA 142 04/22/2023   K 4.4 04/22/2023   CL 106 04/22/2023   CO2 29 04/22/2023    Lab Results  Component Value Date   CHOL 242 (H) 04/22/2023   HDL 45.50 04/22/2023   LDLCALC 153 (H) 04/22/2023   TRIG 217.0 (H) 04/22/2023   CHOLHDL 5 04/22/2023    Medications Reviewed Today     Reviewed by [name], RPH-CPP (Pharmacist) on 05/25/23 at 1632  Med List Status: <None>   Medication Order Taking? Sig Documenting Provider Last Dose Status Informant  albuterol (PROVENTIL) (2.5 MG/3ML) 0.083% nebulizer solution [id number]  USE 1 VIAL IN NEBULIZER EVERY 6 HOURS AS NEEDED FOR WHEEZING AND FOR SHORTNESS OF [place], Efraim Kaufmann, NP  Active   albuterol (VENTOLIN HFA) 108 (90 Base) MCG/ACT inhaler [id number]  Inhale 2 puffs into the lungs every 6 (six) hours as needed for wheezing or shortness of breath. [name], NP  Active   allopurinol (ZYLOPRIM) 100 MG tablet [id number] Yes Take 2 tablets (200 mg total) by mouth daily. [name], NP Taking Active   amitriptyline (ELAVIL) 10 MG tablet [id number] Yes Take 1 tablet (10 mg total) by mouth daily at 6 (six) AM. At 6 PM [name], NP Taking Active   aspirin 81 MG EC tablet [id number] Yes Take 1 tablet (81 mg total) by mouth daily. Swallow whole. Tobb,  Lavona Mound, DO Taking Active            Med Note Clydie Braun, Dhrithi Riche B   Wed Oct 07, 2021 11:19 AM)    atorvastatin (LIPITOR) 80 MG tablet [id number] Yes Take 1 tablet (80 mg total) by mouth daily at 6 PM [name], NP Taking Active   Blood Glucose Monitoring Suppl (ACCU-CHEK GUIDE ME) w/Device KIT [id number] Yes Use to test blood sugar in the morning, at noon, and at  bedtime. [name], NP Taking Active   clobetasol ointment (TEMOVATE) 0.05 % [id number]  Apply 1 Application topically 2 (two) times daily. For up to 2 weeks. [name], NP  Active   colchicine 0.6 MG tablet [id number] Yes Take 0.6 mg by mouth daily as needed (gout). [provider] Taking Active   diazepam (VALIUM) 10 MG tablet [id number] Yes Take 1 tablet (10 mg total) by mouth every 12 (twelve) hours as needed for anxiety. [name], NP Taking Active   dicyclomine (BENTYL) 10 MG capsule [id number] Yes Take 1 capsule (10 mg total) by mouth 2 (two) times daily. Meredith Pel, NP Taking Active   escitalopram (LEXAPRO) 20 MG tablet [id number] Yes Take 1 tablet (20 mg total) by mouth daily with lunch [name], NP Taking Active   ezetimibe (ZETIA) 10 MG tablet [id number] Yes Take 1 tablet (10 mg total) by mouth daily with lunch. [name], NP Taking Active   famotidine (PEPCID) 20 MG tablet [id number] Yes Take 1 tablet (20 mg total) by mouth in the morning and at bedtime. (8 am & 10 pm) [name], NP Taking Active   isosorbide mononitrate (IMDUR) 60 MG 24 hr tablet [id number] Yes Take 1 tablet (60 mg total) by mouth daily at 6 PM. [name], NP Taking Active   levothyroxine (SYNTHROID) 25 MCG tablet [id number] Yes Take 1 tablet (25 mcg total) by mouth before breakfast. Bradd Canary, MD Taking Active   lisinopril (ZESTRIL) 2.5 MG tablet [id number] Yes Take 1 tablet (2.5 mg total) by mouth daily with lunch. [name], NP Taking Active   meclizine (ANTIVERT) 25 MG tablet [id number] Yes Take 1 tablet (25 mg total) by mouth 3 (three) times daily as needed for dizziness. [name], NP Taking Active   metoprolol succinate (TOPROL-XL) 25 MG 24 hr tablet [id number] Yes Take 0.5 tablets (12.5 mg total) by mouth every evening. [name], NP Taking Active            Med Note Clydie Braun, Breeona Waid B   Thu May 19, 2023  12:52 PM)    nitroGLYCERIN (NITROSTAT) 0.4 MG SL tablet [id number] Yes Place 1 tablet (0.4 mg total) under the tongue every 5 (five) minutes as needed for chest pain. Thomasene Ripple, DO Taking Active   nystatin (MYCOSTATIN/NYSTOP) powder [id number] Yes Apply 1 Application topically to the affected area(s) 2 (two) times daily. [name], NP Taking Active   potassium chloride SA (KLOR-CON M) 20 MEQ tablet [id number] Yes Take 1 tablet (20 mEq total) by mouth daily at 12 noon. [name], NP Taking Active   Probiotic Product (PROBIOTIC DAILY) CAPS [id number]  Take 1 tablet by mouth daily. Clayborne Dana, NP  Active   Respiratory Therapy Supplies (NEBULIZER/TUBING/MOUTHPIECE) Andria Rhein [id number]  Use as directed [name], NP  Active   sucralfate (CARAFATE) 1 g tablet [id number] Yes Take 1 tablet (1 g total) by mouth daily. Take one hour prior to morning medications. Meredith Pel, NP Taking Active   valACYclovir (VALTREX) 500 MG tablet  [id number] Yes Take 1 tablet (500 mg total) by mouth every other day. [name], NP Taking Active               Assessment/Plan:   Medication Management: - Reminded patient to use magnifier to reach packages. - Also she can try using scissors to cut the packages to that she doesn't tear them incorrectly / lose tablets.   - Reviewed medication scheduled. Patient endorses that she understands take new adherence packages.   Follow Up Plan: 1 week  [name], PharmD Clinical Pharmacist [place] [facility]

## 2023-05-26 ENCOUNTER — Other Ambulatory Visit: Payer: Self-pay

## 2023-05-26 ENCOUNTER — Other Ambulatory Visit (HOSPITAL_COMMUNITY): Payer: Self-pay

## 2023-05-27 ENCOUNTER — Other Ambulatory Visit: Payer: Self-pay

## 2023-05-27 ENCOUNTER — Other Ambulatory Visit (HOSPITAL_COMMUNITY): Payer: Self-pay

## 2023-05-28 ENCOUNTER — Other Ambulatory Visit (HOSPITAL_COMMUNITY): Payer: Self-pay

## 2023-05-30 ENCOUNTER — Other Ambulatory Visit (HOSPITAL_COMMUNITY): Payer: Self-pay

## 2023-05-30 ENCOUNTER — Other Ambulatory Visit: Payer: Self-pay

## 2023-05-31 ENCOUNTER — Other Ambulatory Visit (HOSPITAL_COMMUNITY): Payer: Self-pay

## 2023-05-31 ENCOUNTER — Other Ambulatory Visit: Payer: Self-pay

## 2023-06-01 ENCOUNTER — Telehealth: Payer: Medicare Other

## 2023-06-01 ENCOUNTER — Telehealth: Payer: Self-pay | Admitting: Pharmacist

## 2023-06-01 NOTE — Telephone Encounter (Signed)
 I was due to check on patient today around 4:15pm but my 3:30 appointmetn ran late. I called patient to check to see if she was doing well with new packaging. She reports she has been doing well. Has not had any confusion.  Patient will call if she has any concerns.

## 2023-06-03 ENCOUNTER — Other Ambulatory Visit (HOSPITAL_COMMUNITY): Payer: Self-pay

## 2023-06-03 ENCOUNTER — Other Ambulatory Visit: Payer: Self-pay

## 2023-06-06 ENCOUNTER — Telehealth: Payer: Self-pay | Admitting: Family

## 2023-06-06 ENCOUNTER — Ambulatory Visit: Payer: Self-pay | Admitting: Licensed Clinical Social Worker

## 2023-06-06 NOTE — Patient Instructions (Signed)
 Visit Information  Thank you for taking time to visit with me today. Please don't hesitate to contact me if I can be of assistance to you.   Following are the goals we discussed today:   Goals Addressed             This Visit's Progress    Increase Access to communtiy/Mental Health Resources       Care Coordination Interventions: Assessed Social Determinants of Health Reviewed all upcoming appointments in Epic system Depression screen reviewed  Active listening / Reflection utilized  Emotional Support Provided Problem Solving /Task Center strategies reviewed Look for letter from [employer] regarding approved transportation Reviewed [employer] options and gave information on activities/resources. Review [employer] instructions with dtr - utilize resource with next appt.          Our next appointment is by telephone on 06/21/23  Please call the care guide team at [phone] if you need to cancel or reschedule your appointment.   If you are experiencing a Mental Health or Behavioral Health Crisis or need someone to talk to, please call the Suicide and Crisis Lifeline: 988  Patient verbalizes understanding of instructions and care plan provided today and agrees to view in MyChart. Active MyChart status and patient understanding of how to access instructions and care plan via MyChart confirmed with patient.     Telephone follow up appointment with care management team member scheduled for: 06/21/2023

## 2023-06-06 NOTE — Patient Outreach (Signed)
 Care Coordination   Follow Up Visit Note   06/06/2023 Name: Veronica Wolf MRN: [id number] DOB: 1940/09/22  Veronica Wolf is a 83 y.o. year old female who sees [name], NP for primary care. I spoke with  [name] by phone today.  What matters to the patients health and wellness today?  Increasing access to community transportation resources.    Goals Addressed             This Visit's Progress    Increase Access to communtiy/Mental Health Resources       Care Coordination Interventions: Assessed Social Determinants of Health Reviewed all upcoming appointments in Epic system Depression screen reviewed  Active listening / Reflection utilized  Emotional Support Provided Problem Solving /Task Center strategies reviewed Look for letter from [employer] regarding approved transportation Reviewed [employer] options and gave information on activities/resources. Review [employer] instructions with dtr - utilize resource with next appt.          SDOH assessments and interventions completed:  Yes     Care Coordination Interventions:  Yes, provided   Interventions Today    Flowsheet Row Most Recent Value  Chronic Disease   Chronic disease during today's visit Other  [SDOH]  General Interventions   General Interventions Discussed/Reviewed [facility], General Interventions Discussed  [Pt reported she has not gotten letter from [facility] but has not checked her mail recently. CSW and pt called [employer] - they confirmed she is approved and we reviewed process for scheduling trips. Pt confirmed understanding.]        Follow up plan: Follow up call scheduled for 06/21/2023    Encounter Outcome:  Patient Visit Completed   Kenton Kingfisher, LCSW [place], [facility] Licensed Clinical Social Worker Care Coordinator [phone]

## 2023-06-06 NOTE — Telephone Encounter (Signed)
 I see that GI started her on dicyclomine.  Since she is on this medicine, I would like for her to stop amitryptyline.

## 2023-06-07 ENCOUNTER — Telehealth: Payer: Self-pay

## 2023-06-07 NOTE — Telephone Encounter (Signed)
 Patient notified of the change and she verbalized understanding. She wrote down name of medication. She gets her medications individually packed. She was advised to have her daughter call the pharmacy to see how they can remove amitriptyline from her daily packs.

## 2023-06-07 NOTE — Telephone Encounter (Signed)
 error

## 2023-06-08 ENCOUNTER — Other Ambulatory Visit (HOSPITAL_COMMUNITY): Payer: Self-pay

## 2023-06-08 ENCOUNTER — Other Ambulatory Visit: Payer: Self-pay

## 2023-06-08 ENCOUNTER — Other Ambulatory Visit: Payer: Self-pay | Admitting: Family

## 2023-06-08 ENCOUNTER — Ambulatory Visit: Payer: Self-pay | Admitting: Family

## 2023-06-08 DIAGNOSIS — G8929 Other chronic pain: Secondary | ICD-10-CM

## 2023-06-08 MED ORDER — DIAZEPAM 10 MG PO TABS
10.0000 mg | ORAL_TABLET | Freq: Two times a day (BID) | ORAL | 0 refills | Status: DC | PRN
  Filled 2023-06-08 – 2023-06-09 (×2): qty 60, 30d supply, fill #0

## 2023-06-08 MED ORDER — AMITRIPTYLINE HCL 10 MG PO TABS
10.0000 mg | ORAL_TABLET | Freq: Every day | ORAL | 1 refills | Status: DC
  Filled 2023-06-08 – 2023-06-09 (×2): qty 90, 90d supply, fill #0
  Filled 2023-06-16: qty 30, 30d supply, fill #0
  Filled 2023-06-16 – 2023-06-27 (×2): qty 90, 90d supply, fill #0

## 2023-06-08 NOTE — Telephone Encounter (Signed)
 Requesting: diazepam 10mg   Contract: 07/26/22 UDS: 07/26/22 Last Visit: 04/22/23 Next Visit: None Last Refill: 05/10/23 #60 and 0RF   Please Advise

## 2023-06-08 NOTE — Telephone Encounter (Signed)
 Copied from CRM [id number]. Topic: Clinical - Red Word Triage >> Jun 08, 2023  2:32 PM Isabell A wrote: Red Word that prompted transfer to Nurse Triage: Patient experiencing abdominal pain and dizziness.   Chief Complaint: abd pain Symptoms: gradual, intermittent pain Frequency: intermittent Pertinent Negatives: Patient denies fever Disposition: [] ED /[] Urgent Care (no appt availability in office) / [] Appointment(In office/virtual)/ []  [place] [facility]/ [] Home Care/ [] Refused Recommended Disposition /[] [place] [facility]/ []  Follow-up with PCP Additional Notes: Pt reports lower abd pain, states pain has been intermittent, but getting worse. Appt scheduled for 3/6.  Reason for Disposition  [1] MODERATE pain (e.g., interferes with normal activities) AND [2] pain comes and goes (cramps) AND [3] present > 24 hours  (Exception: Pain with Vomiting or Diarrhea - see that Guideline.)  Answer Assessment - Initial Assessment Questions 1. LOCATION: "Where does it hurt?"      Just above belly button  2. RADIATION: "Does the pain shoot anywhere else?" (e.g., chest, back)     No  3. ONSET: "When did the pain begin?" (e.g., minutes, hours or days ago)      Started this morning  4. SUDDEN: "Gradual or sudden onset?"     Gradual  5. PATTERN "Does the pain come and go, or is it constant?"    - If it comes and goes: "How long does it last?" "Do you have pain now?"     (Note: Comes and goes means the pain is intermittent. It goes away completely between bouts.)    - If constant: "Is it getting better, staying the same, or getting worse?"      (Note: Constant means the pain never goes away completely; most serious pain is constant and gets worse.)      Comes and goes, getting worse  6. SEVERITY: "How bad is the pain?"  (e.g., Scale 1-10; mild, moderate, or severe)    - MILD (1-3): Doesn't interfere with normal activities, abdomen soft and not tender to touch.     - MODERATE (4-7): Interferes  with normal activities or awakens from sleep, abdomen tender to touch.     - SEVERE (8-10): Excruciating pain, doubled over, unable to do any normal activities.       Moderate  7. RECURRENT SYMPTOM: "Have you ever had this type of stomach pain before?" If Yes, ask: "When was the last time?" and "What happened that time?"      Yes, diagnosed with Chronic Upper Abdominal pain, started on sucralfate and bentyl   8. CAUSE: "What do you think is causing the stomach pain?"     Unsure  9. RELIEVING/AGGRAVATING FACTORS: "What makes it better or worse?" (e.g., antacids, bending or twisting motion, bowel movement)     *No Answer* 10. OTHER SYMPTOMS: "Do you have any other symptoms?" (e.g., back pain, diarrhea, fever, urination pain, vomiting)       *No Answer* 11. PREGNANCY: "Is there any chance you are pregnant?" "When was your last menstrual period?"       *No Answer*  Protocols used: Abdominal Pain - [facility]

## 2023-06-09 ENCOUNTER — Other Ambulatory Visit: Payer: Self-pay | Admitting: Family

## 2023-06-09 ENCOUNTER — Other Ambulatory Visit: Payer: Self-pay

## 2023-06-09 ENCOUNTER — Ambulatory Visit: Admitting: Family Medicine

## 2023-06-09 ENCOUNTER — Other Ambulatory Visit (HOSPITAL_COMMUNITY): Payer: Self-pay

## 2023-06-09 MED ORDER — LISINOPRIL 2.5 MG PO TABS
2.5000 mg | ORAL_TABLET | Freq: Every day | ORAL | 0 refills | Status: DC
  Filled 2023-06-16: qty 30, 30d supply, fill #0
  Filled 2023-06-16: qty 90, 90d supply, fill #0
  Filled 2023-07-07 – 2023-07-12 (×2): qty 30, 30d supply, fill #1
  Filled 2023-07-26 – 2023-08-12 (×3): qty 30, 30d supply, fill #2

## 2023-06-09 MED ORDER — ISOSORBIDE MONONITRATE ER 60 MG PO TB24
60.0000 mg | ORAL_TABLET | Freq: Every day | ORAL | 0 refills | Status: DC
  Filled 2023-06-16: qty 30, 30d supply, fill #0
  Filled 2023-06-16: qty 90, 90d supply, fill #0
  Filled 2023-07-07 – 2023-07-12 (×2): qty 30, 30d supply, fill #1
  Filled 2023-07-26 – 2023-08-12 (×3): qty 30, 30d supply, fill #2

## 2023-06-09 MED ORDER — ALLOPURINOL 100 MG PO TABS
200.0000 mg | ORAL_TABLET | Freq: Every day | ORAL | 0 refills | Status: DC
  Filled 2023-06-16: qty 60, 30d supply, fill #0
  Filled 2023-06-16: qty 180, 90d supply, fill #0
  Filled 2023-07-07 – 2023-07-12 (×2): qty 60, 30d supply, fill #1
  Filled 2023-07-26 – 2023-08-12 (×3): qty 60, 30d supply, fill #2

## 2023-06-09 MED ORDER — EZETIMIBE 10 MG PO TABS
10.0000 mg | ORAL_TABLET | Freq: Every day | ORAL | 0 refills | Status: DC
  Filled 2023-06-16: qty 90, 90d supply, fill #0
  Filled 2023-06-16: qty 30, 30d supply, fill #0
  Filled 2023-07-07 – 2023-07-12 (×2): qty 30, 30d supply, fill #1
  Filled 2023-07-26 – 2023-08-12 (×3): qty 30, 30d supply, fill #2

## 2023-06-14 ENCOUNTER — Other Ambulatory Visit (HOSPITAL_COMMUNITY): Payer: Self-pay

## 2023-06-15 ENCOUNTER — Other Ambulatory Visit (HOSPITAL_COMMUNITY): Payer: Self-pay

## 2023-06-16 ENCOUNTER — Other Ambulatory Visit: Payer: Self-pay

## 2023-06-16 ENCOUNTER — Other Ambulatory Visit (HOSPITAL_COMMUNITY): Payer: Self-pay

## 2023-06-17 ENCOUNTER — Other Ambulatory Visit: Payer: Self-pay

## 2023-06-21 ENCOUNTER — Encounter: Payer: Self-pay | Admitting: Licensed Clinical Social Worker

## 2023-06-22 ENCOUNTER — Ambulatory Visit (INDEPENDENT_AMBULATORY_CARE_PROVIDER_SITE_OTHER): Payer: Medicare Other | Admitting: Pharmacist

## 2023-06-22 ENCOUNTER — Ambulatory Visit: Payer: Self-pay

## 2023-06-22 DIAGNOSIS — Z79899 Other long term (current) drug therapy: Secondary | ICD-10-CM

## 2023-06-22 DIAGNOSIS — R195 Other fecal abnormalities: Secondary | ICD-10-CM

## 2023-06-22 NOTE — Progress Notes (Signed)
 05/18/2023 Name: Veronica Wolf MRN: [id number] DOB: [birth year] Chief Complaint  Patient presents with   Medication Management    Veronica Wolf is a 83 y.o. year old female who was referred for medication management by their primary care provider, [name], NP.       Subjective: I already had appointment for a phone follow up today already so I spoke with patient. There was notation that she has been off metoprolol and amitryptyline for the last week but no notation as to why.  Per patient she forgot to take them last week when she left for funerals out of town. She actually was not supposed to be taking amitriptyline because it was stopped 06/06/2023 per PCP notes since she is also taking dicyclomine for abdominal pain. Amitriptyline and metoprolol are not usually included in her adherence packaging. Amitriptyline because it is taken at night and pt prefers to have by her bedside and metoprolol is not in packaging because the tablet is halved and pharmacy is not able to include halved tablets in packages.  Patient called in to the office today with complaint of abdominal pain and black stools. Her call was forwarded to triages nurse how recommended she present to the ED. Patient refused and PCP team also contacted her and her daughter to encourage her to go to ED for evaluation.  Per patient she last has black stool 1 week ago before she left to go out of town for 2 funerals. She has been taking Pepto-BIsmol of stomach issues. Reports today that he BMS have been normal since last week. No black stools since, no loose stools or constipation.  She had abdominal pain this morning. She slept late this morning because she gto in late last night from her out of town trip. She has only coffee this morning when she woke. This afternoon she ate cereal and abdominal pain has improved.    Medication Access/Adherence  Current Pharmacy:  [facility] [address] [place] [address] Phone: [phone] Fax: [phone]  Patient reports affordability concerns with their medications: No  Patient reports access/transportation concerns to their pharmacy:  medications are delivered to her - if needed her daughter can pick up medications Patient reports adherence concerns with their medications:  Yes  - missed 1 week of metoprolol when she forgot to take the medication with her when she left for out of town trip.    Current adherence strategy: uses adherence packaging - she has received a new box of packaged meds this week. She is due to start new packages 06/23/2023 - per patient.  New packaging should have this schedule:  Morning 8am: sucralfate,  levothyroxine, famotidine, dicyclomine, escitalopram, (ideally we would separate sucralfate from levothyroxine but patient has been taking together for 7 months and last TSH 04/2023 was WNL / stable) Lunch- 12pm / noon:  potassium, ezetimibe, lisinopril, allopurinol #2 tabs Night / 10pm: isosorbide, dicyclomine, famotidine, atorvastatin  She takes the following medications which are not included in her packaged medication:  aspirin 81mg  daily at night, metoprolol ER 25mg  0.5 tablet at night, diazepam as needed and valacyclovir every other night.  Patient keeps these medications by her bed to take because they cannot be included in packaging    Objective:  Lab Results  Component Value Date   HGBA1C 6.8 (H) 04/22/2023    Lab Results  Component Value Date   CREATININE 1.49 (H) 04/22/2023   BUN 11 04/22/2023  NA 142 04/22/2023   K 4.4 04/22/2023   CL 106 04/22/2023   CO2 29 04/22/2023    Lab Results  Component Value Date   CHOL 242 (H) 04/22/2023   HDL 45.50 04/22/2023   LDLCALC 153 (H) 04/22/2023   TRIG 217.0 (H) 04/22/2023   CHOLHDL 5 04/22/2023    Medications Reviewed Today     Reviewed by [name], RPH-CPP (Pharmacist) on 06/22/23 at 1603  Med List Status: <None>    Medication Order Taking? Sig Documenting Provider Last Dose Status Informant  albuterol (PROVENTIL) (2.5 MG/3ML) 0.083% nebulizer solution [id number]  USE 1 VIAL IN NEBULIZER EVERY 6 HOURS AS NEEDED FOR WHEEZING AND FOR SHORTNESS OF [place]et [place], Efraim Kaufmann, NP  Active   albuterol (VENTOLIN HFA) 108 (90 Base) MCG/ACT inhaler [id number] No Inhale 2 puffs into the lungs every 6 (six) hours as needed for wheezing or shortness of breath.  Patient not taking: Reported on 06/22/2023   [name], NP Not Taking Active   allopurinol (ZYLOPRIM) 100 MG tablet [id number] Yes Take 2 tablets (200 mg total) by mouth daily. [name], NP Taking Active   amitriptyline (ELAVIL) 10 MG tablet [id number] No Take 1 tablet (10 mg total) by mouth daily at 6 PM.  Patient not taking: Reported on 06/22/2023   [name], NP Not Taking Active   aspirin 81 MG EC tablet [id number] Yes Take 1 tablet (81 mg total) by mouth daily. Swallow whole. Thomasene Ripple, DO Taking Active            Med Note Clydie Braun, Neeley Sedivy B   Wed Oct 07, 2021 11:19 AM)    atorvastatin (LIPITOR) 80 MG tablet [id number] Yes Take 1 tablet (80 mg total) by mouth daily at 6 PM [name], NP Taking Active   Blood Glucose Monitoring Suppl (ACCU-CHEK GUIDE ME) w/Device KIT [id number]  Use to test blood sugar in the morning, at noon, and at bedtime. [name], NP  Active   clobetasol ointment (TEMOVATE) 0.05 % [id number]  Apply 1 Application topically 2 (two) times daily. For up to 2 weeks. [name], NP  Active   colchicine 0.6 MG tablet [id number]  Take 0.6 mg by mouth daily as needed (gout). [provider]  Active   diazepam (VALIUM) 10 MG tablet [id number] Yes Take 1 tablet (10 mg total) by mouth every 12 (twelve) hours as needed for anxiety. [name], NP Taking Active   dicyclomine (BENTYL) 10 MG capsule [id number] Yes Take 1 capsule (10 mg total) by mouth 2 (two) times daily. Meredith Pel, NP Taking Active   escitalopram (LEXAPRO) 20 MG tablet [id number] Yes Take 1 tablet (20 mg total) by mouth daily with lunch [name], NP Taking Active   ezetimibe (ZETIA) 10 MG tablet [id number] Yes Take 1 tablet (10 mg total) by mouth daily with lunch. [name], NP Taking Active   famotidine (PEPCID) 20 MG tablet [id number] Yes Take 1 tablet (20 mg total) by mouth in the morning and at bedtime. (8 am & 10 pm) [name], NP Taking Active   isosorbide mononitrate (IMDUR) 60 MG 24 hr tablet [id number] Yes Take 1 tablet (60 mg total) by mouth daily at 6 PM. [name], NP Taking Active   levothyroxine (SYNTHROID) 25 MCG tablet [id number] Yes Take 1 tablet (25 mcg total) by mouth before breakfast. Bradd Canary, MD Taking Active   lisinopril (ZESTRIL) 2.5 MG tablet [id number] Yes Take 1 tablet (2.5 mg total) by mouth daily with lunch.  [name], NP Taking Active   meclizine (ANTIVERT) 25 MG tablet [id number] Yes Take 1 tablet (25 mg total) by mouth 3 (three) times daily as needed for dizziness. [name], NP Taking Active   metoprolol succinate (TOPROL-XL) 25 MG 24 hr tablet [id number] No Take 0.5 tablets (12.5 mg total) by mouth every evening.  Patient not taking: Reported on 06/22/2023   [name], NP Not Taking Active            Med Note [facility], [facility] B   Thu May 19, 2023 12:52 PM)    nitroGLYCERIN (NITROSTAT) 0.4 MG SL tablet [id number]  Place 1 tablet (0.4 mg total) under the tongue every 5 (five) minutes as needed for chest pain. Tobb, Kardie, DO  Active   nystatin (MYCOSTATIN/NYSTOP) powder [id number] Yes Apply 1 Application topically to the affected area(s) 2 (two) times daily. [name], NP Taking Active   potassium chloride SA (KLOR-CON M) 20 MEQ tablet [id number] Yes Take 1 tablet (20 mEq total) by mouth daily at 12 noon. [name], NP Taking Active   Probiotic Product (PROBIOTIC DAILY) CAPS [id number] Yes Take  1 tablet by mouth daily. Clayborne Dana, NP Taking Active   Respiratory Therapy Supplies (NEBULIZER/TUBING/MOUTHPIECE) Andria Rhein [id number]  Use as directed [name], NP  Active   sucralfate (CARAFATE) 1 g tablet [id number] Yes Take 1 tablet (1 g total) by mouth daily. Take one hour prior to morning medications. Meredith Pel, NP Taking Active   valACYclovir (VALTREX) 500 MG tablet [id number] Yes Take 1 tablet (500 mg total) by mouth every other day. [name], NP Taking Active               Assessment/Plan:  Abdominal Pain / Black Stools - It appears black stool occurred last week and abldominal pain is improved.  - Encouraged patient to follow recommendation of triage nurse and PCP care team and go to ED - patient continues to decline.  - Explained that black stools can be a sign of serious issues with her GI system - Patient should consider follow up with GI specialist as well since she continues to have abdominal pain.  - Recommended that she not take Pepto Bismol.   Medication Management: - Reminded patient that PCP told her to stop amitriptyline 06/06/2023. Removed medication from her med list. Looks like amitriptyline was stopped in response to [employer] message sent to PCP due to risk of anticholinergic effects. Recommend try to limit use of anticholinergics due to patient's age and risk of adverse effects.   Follow Up Plan: 3 to 4 weeks.  [name], PharmD Clinical Pharmacist [facility]

## 2023-06-22 NOTE — Telephone Encounter (Signed)
 Spoke to patient's daughter and advised if patient is having severe abdominal pain and dark stools she need to go to the ed to be evaluated. She verbalized understanding and will "try to bring to [facility] ed"

## 2023-06-22 NOTE — Telephone Encounter (Signed)
 Chief Complaint: abdominal pain Symptoms: severe upper abdominal pain constant, black diarrhea (one week ago), insomnia Frequency: started this morning, present now Pertinent Negatives: Patient denies chest pain, SOB, nausea, vomiting, sweating Disposition: [x] ED /[] Urgent Care (no appt availability in office) / [] Appointment(In office/virtual)/ []  [place] [facility]/ [] Home Care/ [] Refused Recommended Disposition /[] [place] [facility]/ []  Follow-up with PCP Additional Notes: Patient was on a week long trip and arrived back last night. Patient states first thing this morning she had abdominal pain and thought it was due to not having eaten anything. She states it improved after eating. Patient has been off her metoprolol and amitriptyline for a week and would like to know if she can start taking the medication first thing in the morning instead of taking them at night. She states she forgets to take them at night. Note in patient's chart on March 3rd states to stop amitryptyline due to GI placing her on dicyclomine. Patient confused and did not know about the change in medications. Also patient was triaged for abdominal pain on 3/5 and no showed for appt 3/6 and she states she was not aware. Patient refused ED disposition and would like to come in for PCP appointment and to advise her about her medication list. Called [facility] and notified staff.  Copied from CRM [id number]. Topic: Clinical - Red Word Triage >> Jun 22, 2023  1:18 PM Pascal Lux wrote: Red Word that prompted transfer to Nurse Triage: Patient stated she's been away for a while and did not take the pills with her and wants to know if she can take the medication in the morning instead of night. She stated she has not taken any of them for the last week.  [ metoprolol succinate (TOPROL-XL) 25 MG 24 hr tablet [[id number]] - amitriptyline (ELAVIL) 10 MG tablet [[id number]]  - Also stated she's not feeling well, stomach pain. Reason for Disposition   [1] SEVERE pain (e.g., excruciating) AND [2] present > 1 hour  Answer Assessment - Initial Assessment Questions 1. LOCATION: "Where does it hurt?"      Upper middle abdominal pain just above belly button.  2. RADIATION: "Does the pain shoot anywhere else?" (e.g., chest, back)     Denies.  3. ONSET: "When did the pain begin?" (e.g., minutes, hours or days ago)      This morning.  4. SUDDEN: "Gradual or sudden onset?"     Sudden.  5. PATTERN "Does the pain come and go, or is it constant?"    - If it comes and goes: "How long does it last?" "Do you have pain now?"     (Note: Comes and goes means the pain is intermittent. It goes away completely between bouts.)    - If constant: "Is it getting better, staying the same, or getting worse?"      (Note: Constant means the pain never goes away completely; most serious pain is constant and gets worse.)      Constant.  6. SEVERITY: "How bad is the pain?"  (e.g., Scale 1-10; mild, moderate, or severe)    - MILD (1-3): Doesn't interfere with normal activities, abdomen soft and not tender to touch..     - MODERATE (4-7): Interferes with normal activities or awakens from sleep, abdomen tender to touch.     - SEVERE (8-10): Excruciating pain, doubled over, unable to do any normal activities.       8/10, severe.  7. RECURRENT SYMPTOM: "Have you ever had this type of  stomach pain before?" If Yes, ask: "When was the last time?" and "What happened that time?"      Yes, she said this is a chronic abdominal pain she gets. Last episode was a couple of weeks ago.  8. AGGRAVATING FACTORS: "Does anything seem to cause this pain?" (e.g., foods, stress, alcohol)     Denies.  9. CARDIAC SYMPTOMS: "Do you have any of the following symptoms: chest pain, difficulty breathing, sweating, nausea?"     Denies.  10. OTHER SYMPTOMS: "Do you have any other symptoms?" (e.g., back pain, diarrhea, fever, urination pain, vomiting)       Black diarrhea a week ago (she  states she was taking Pepto Bismol that day and attributed it to that).  11. PREGNANCY: "Is there any chance you are pregnant?" "When was your last menstrual period?"       N/A.  Protocols used: Abdominal Pain - Upper-A-AH

## 2023-06-23 ENCOUNTER — Other Ambulatory Visit: Payer: Self-pay

## 2023-06-27 ENCOUNTER — Other Ambulatory Visit: Payer: Self-pay

## 2023-06-29 ENCOUNTER — Telehealth: Payer: Self-pay

## 2023-06-29 NOTE — Telephone Encounter (Signed)
 Tried to call patient again - no answer and no VM to LM

## 2023-06-29 NOTE — Telephone Encounter (Signed)
 Copied from CRM [id number]. Topic: Clinical - Medication Question >> Jun 29, 2023 11:31 AM Fredrich Romans wrote: Reason for CRM: Patient called in stating that she have a whole box of pills that she needs to know when she's suppose to start taking them? She couldtn tell me a name of a specific medication.

## 2023-06-29 NOTE — Telephone Encounter (Addendum)
 Attemped to call patient. It phone rang several times and it sounded like someone answered but I couldn't hear anyone on the other end. Maybe call was dropped?   Patient likely has received her most recent adherence packaging which she has been getting for > a year. It comes ni a box - she just has to start the package roll inside on the date indicated. Packaging indicated date and time to take medication as well has has which medications are in each package.

## 2023-07-01 ENCOUNTER — Ambulatory Visit: Payer: Self-pay | Admitting: Family

## 2023-07-01 ENCOUNTER — Ambulatory Visit: Admitting: Family

## 2023-07-01 NOTE — Telephone Encounter (Signed)
 Patient called in to report stomach pain. Patient declined triage and is requesting to cancel her appointment with PCP today. Patient stated she wanted to make an appointment with her GI doctor instead. Patient stated she has already received a referral from her PCP and saw the GI doctor in November. This RN advised patient to call GI doctor to schedule an appointment. This RN advised patient to call back if she has any problems. This RN canceled appointment for today, per patient's request.   Copied from CRM [id number]. Topic: Clinical - Red Word Triage >> Jul 01, 2023 10:55 AM Mackie Pai E wrote: [facility] that prompted transfer to Nurse Triage: Patient having abdominal pain. Patient rated the pain a level 7 out of 10. Pain was going on last night and then again this morning. Reason for Disposition . General information question, no triage required and triager able to answer question  Protocols used: Information Only Call - No Triage-A-AH

## 2023-07-01 NOTE — Telephone Encounter (Signed)
 FYI

## 2023-07-05 ENCOUNTER — Ambulatory Visit (INDEPENDENT_AMBULATORY_CARE_PROVIDER_SITE_OTHER): Admitting: Family

## 2023-07-05 VITALS — BP 103/72 | HR 92 | Temp 97.6°F | Resp 16 | Ht 64.0 in

## 2023-07-05 DIAGNOSIS — R300 Dysuria: Secondary | ICD-10-CM | POA: Diagnosis not present

## 2023-07-05 DIAGNOSIS — E118 Type 2 diabetes mellitus with unspecified complications: Secondary | ICD-10-CM | POA: Diagnosis not present

## 2023-07-05 DIAGNOSIS — R1084 Generalized abdominal pain: Secondary | ICD-10-CM | POA: Diagnosis not present

## 2023-07-05 NOTE — Assessment & Plan Note (Addendum)
 Could not urinate, advised to bring back specimen cup tomorrow.

## 2023-07-05 NOTE — Telephone Encounter (Signed)
 Tried to call - "Call cannot be completed at this time" and unable to LM on VM.

## 2023-07-05 NOTE — Assessment & Plan Note (Addendum)
 Chronic.  Chronic intermittent mid-upper abdominal pain likely related to constipation, as she does not have daily bowel movements and reports relief after defecation. Prior evaluations including EGD, CT, and ultrasound which have all been unremarkable. A follow-up with GI is scheduled for May 1st. - Recommend daily Metamucil to improve bowel regularity - Order liver function tests and serum amylase/lipase to rule out pancreatitis - Follow-up with GI on May 1st -unfortunately, she did not stop by the lab for blood draw on her way out.

## 2023-07-05 NOTE — Telephone Encounter (Signed)
 Attempted to contact patient's daughter - first number tried - call could not be completed. Second number tried - not a valid number.

## 2023-07-05 NOTE — Progress Notes (Cosign Needed Addendum)
   Established Patient Office Visit  Subjective   Patient ID: Veronica Wolf, female    DOB: 11-28-1940  Age: 83 y.o. MRN: [id number]  No chief complaint on file.   Pleasant 83 yo patient with complaints of mid-abdominal/epigastric pain since last night. Denies radiation. States she was lying down when the pain started. Had nausea at onset but has since resolved. States pain started after eating ice cream and chocolate. Pain has improved but still present. Took aspirin, Pepto-Bismol, and ibuprofen for pain relief. States she has not been having regular bowel movements. Reports black stool x 1 after Pepto-Bismol. Scant red blood on tissue after wiping. States she did not see blood in BM or in toilet. Denies history of hemorrhoids.   Patient also c/o dysuria. She reports that she does not remember when this symptoms started. Denies flank pain, fever, or chills.  Abdominal Pain    Review of Systems  Gastrointestinal:  Positive for abdominal pain.  See HPI    Objective:    Physical Exam Vitals reviewed.  Constitutional:      General: She is not in acute distress.    Appearance: Normal appearance. She is not ill-appearing or toxic-appearing.  HENT:     Head: Normocephalic and atraumatic.     Right Ear: External ear normal.     Left Ear: External ear normal.     Mouth/Throat:     Mouth: Mucous membranes are moist.     Pharynx: Oropharynx is clear.  Cardiovascular:     Rate and Rhythm: Normal rate and regular rhythm.     Pulses: Normal pulses.     Heart sounds: Normal heart sounds.  Pulmonary:     Effort: Pulmonary effort is normal.     Breath sounds: Examination of the left-upper field reveals decreased breath sounds. Decreased breath sounds present.  Abdominal:     General: Bowel sounds are normal.     Palpations: Abdomen is soft.     Tenderness: There is abdominal tenderness in the epigastric area.  Skin:    General: Skin is warm and dry.     Capillary Refill: Capillary  refill takes less than 2 seconds.  Neurological:     Mental Status: She is alert. Mental status is at baseline.  Psychiatric:        Mood and Affect: Mood normal.        Behavior: Behavior normal.      Assessment & Plan:   -Abdominal pain - patient will follow up with GI on 08/04/2023. Will check CBC, CMP, lipase, UA C&S. Patient to continue dicyclomine, sucralfate, and famotidine. GI symptoms are likely related to constipation since symptoms improve after defecation. Recommend daily Metamucil to promote bowel regularity.   -Diabetes mellitus - CMP and urine microalbumin to be drawn today. Diabetic foot exam completed with no abnormal findings.    -Hypertension - stable on lisinopril and metoprolol. Will continue same.    Cristopher Peru, RN

## 2023-07-05 NOTE — Patient Instructions (Addendum)
 Add metamucil once daily.  -DYSURIA: Dysuria means you have soreness during urination, which may indicate a urinary tract infection. We have ordered a urinalysis and urine culture to determine the cause and will consider treatment based on the results.  -SOCIAL SUPPORT AND SAFETY CONCERNS: You have expressed concerns about your safety at home due to your grandson's behavior and feelings of loneliness. We will refer you to social work for assistance with transportation and social support. We also recommend changing the locks on your home for added safety.  -GENERAL HEALTH MAINTENANCE: We discussed general health maintenance, including the Shingrix vaccination and a urine microalbumin test to monitor kidney function. The Shingrix vaccination will be deferred to your next visit, and we have ordered the urine microalbumin test.  INSTRUCTIONS:  Please follow up with the GI specialist on May 1st. Additionally, ensure you provide a urine specimen for the urinalysis and urine culture as soon as possible. Consider changing the locks on your home for added safety and reach out to social work for assistance with transportation and social support.

## 2023-07-05 NOTE — Progress Notes (Signed)
 Subjective:     Patient ID: Veronica Wolf, female    DOB: 1940-11-20, 83 y.o.   MRN: [id number]  Chief Complaint  Patient presents with   Abdominal Pain    Patient complains of abdominal pain    HPI  Discussed the use of AI scribe software for clinical note transcription with the patient, who gave verbal consent to proceed.  History of Present Illness  Veronica Wolf "Veronica Wolf" is an 83 year old female who presents with gastrointestinal symptoms and dysuria. She is accompanied by her daughter.  She experiences chronic intermittent mid-upper abdominal pain. Previous evaluations, including EGD, CT, and ultrasound, have not identified a definitive cause. Carafate has provided some relief in the past. Recently, she had a severe episode of diarrhea after taking Pepto Bismol, resulting in black stools. She noticed a small amount of red blood on the tissue after wiping, but no fresh blood in the toilet. No history of hemorrhoids or fissures. Her bowel habits are irregular, with bowel movements occurring every two days, and sometimes experiencing diarrhea. She does not have daily bowel movements and sometimes skips two days. She has been using Pepto Bismol to aid bowel movements, which resulted in black stools. She has recently purchased Metamucil to help regulate her bowel movements.  She reports dysuria, describing soreness during urination. She has not left a urine specimen recently but plans to do so. She also mentions frequent urination.  She experiences shortness of breath and has a history of a long walk to the clinic, which she finds challenging. She also reports a history of pain radiating down her left arm and leg, which she attributes to a pinched nerve. Additionally, she has experienced pain in her right shoulder, which she associates with her sleeping position.  Socially, she lives alone and expresses feelings of loneliness and fear due to her grandson's behavior, who has been  intrusive and disrespectful. She has concerns about her safety at home and has taken measures to secure her home, such as placing a chair against the door. She is considering changing the locks for added security. She has difficulty walking alone and prefers company for safety. She is seeking assistance with transportation to appointments.     Health Maintenance Due  Topic Date Due   OPHTHALMOLOGY EXAM  Never done   Zoster Vaccines- Shingrix (1 of 2) Never done   COVID-19 Vaccine (3 - Pfizer risk series) 07/06/2019   Medicare Annual Wellness (AWV)  06/09/2023   Diabetic kidney evaluation - Urine ACR  07/28/2023    Past Medical History:  Diagnosis Date   Allergic rhinitis    Allergy    seasonal allergies   Anxiety    CKD (chronic kidney disease) 04/04/2017   COPD (chronic obstructive pulmonary disease) (HCC)    uses inhaler   Depression    Fatty liver    GERD (gastroesophageal reflux disease)    Gout    hx of   Hyperlipidemia    on meds   Hypertension    on meds   Myocardial infarction [facility])    Peptic ulcer 01/04/2003   Vertigo     Past Surgical History:  Procedure Laterality Date   ABDOMINAL HYSTERECTOMY     APPENDECTOMY     BIOPSY  08/13/2020   Procedure: BIOPSY;  Surgeon: Shellia Cleverly, DO;  Location: [facility];  Service: Gastroenterology;;   CORONARY PRESSURE/FFR STUDY N/A 01/27/2022   Procedure: INTRAVASCULAR PRESSURE WIRE/FFR STUDY;  Surgeon: Orbie Pyo, MD;  Location: [facility] IN[facility]AS[facility] CV LAB;  Service: Cardiovascular;  Laterality: N/A;   ESOPHAGOGASTRODUODENOSCOPY (EGD) WITH PROPOFOL N/A 08/13/2020   Procedure: ESOPHAGOGASTRODUODENOSCOPY (EGD) WITH PROPOFOL;  Surgeon: Shellia Cleverly, DO;  Location: [facility];  Service: Gastroenterology;  Laterality: N/A;   LEFT HEART CATH AND CORONARY ANGIOGRAPHY N/A 06/13/2017   Procedure: LEFT HEART CATH AND CORONARY ANGIOGRAPHY;  Surgeon: Marykay Lex, MD;  Location: [facility] INVASIVE CV LAB;  Service:  Cardiovascular;  Laterality: N/A;   LEFT HEART CATH AND CORONARY ANGIOGRAPHY N/A 01/27/2022   Procedure: LEFT HEART CATH AND CORONARY ANGIOGRAPHY;  Surgeon: Orbie Pyo, MD;  Location: [facility]VAS[facility] CV LAB;  Service: Cardiovascular;  Laterality: N/A;   RIGHT/LEFT HEART CATH AND CORONARY ANGIOGRAPHY N/A 09/16/2017   Procedure: RIGHT/LEFT HEART CATH AND CORONARY ANGIOGRAPHY;  Surgeon: Swaziland, Peter M, MD;  Location: [facility] INVASIVE CV LAB;  Service: Cardiovascular;  Laterality: N/A;   SAVORY DILATION N/A 08/13/2020   Procedure: SAVORY DILATION;  Surgeon: Shellia Cleverly, DO;  Location: [facility];  Service: Gastroenterology;  Laterality: N/A;   ULTRASOUND GUIDANCE FOR VASCULAR ACCESS  09/16/2017   Procedure: Ultrasound Guidance For Vascular Access;  Surgeon: Swaziland, Peter M, MD;  Location: [facility] INVASIVE CV LAB;  Service: Cardiovascular;;   WISDOM TOOTH EXTRACTION      Family History  Problem Relation Age of Onset   Breast cancer Mother    Stroke Father    Stomach cancer Neg Hx    Colon cancer Neg Hx    Pancreatic cancer Neg Hx    Esophageal cancer Neg Hx    Colon polyps Neg Hx    Rectal cancer Neg Hx     Social History   Socioeconomic History   Marital status: Widowed    Spouse name: Not on file   Number of children: 1   Years of education: 66   Highest education level: 12th grade  Occupational History   Occupation: Retired  Tobacco Use   Smoking status: Former    Current packs/day: 0.00    Types: Cigarettes    Quit date: 04/24/2000    Years since quitting: 23.2    Passive exposure: Past   Smokeless tobacco: Never  Vaping Use   Vaping status: Never Used  Substance and Sexual Activity   Alcohol use: Not Currently    Comment: has previous hx of ETOH abuse, quit 2014   Drug use: No   Sexual activity: Not Currently  Other Topics Concern   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[place]   Grew up in [place]   Has daughter locally    Social Drivers of Health   Financial Resource Strain: Low Risk  (10/07/2021)   Overall Financial Resource Strain (CARDIA)    Difficulty of Paying Living Expenses: Not very hard  Food Insecurity: Food Insecurity Present (05/09/2023)   Hunger Vital Sign    Worried About Running Out of Food in the Last Year: Sometimes true    Ran Out of Food in the Last Year: Sometimes true  Transportation Needs: Unmet Transportation Needs (05/09/2023)   PRAPARE - Transportation    Lack of Transportation (Medical): Yes    Lack of Transportation (Non-Medical): Yes  Physical Activity: Inactive (05/13/2021)   Exercise Vital Sign    Days of Exercise per Week: 0 days    Minutes of Exercise per Session: 0 min  Stress: Stress Concern Present (05/13/2021)   [employer] of Occupational Health - Occupational Stress  Questionnaire    Feeling of Stress : Rather much  Social Connections: Moderately Isolated (05/13/2021)   Social Connection and Isolation Panel [NHANES]    Frequency of Communication with Friends and Family: More than three times a week    Frequency of Social Gatherings with Friends and Family: More than three times a week    Attends Religious Services: 1 to 4 times per year    Active Member of [employer] or Organizations: No    Attends Banker Meetings: Never    Marital Status: Widowed  Intimate Partner Violence: Not At Risk (05/09/2023)   Humiliation, Afraid, Rape, and Kick questionnaire    Fear of Current or Ex-Partner: No    Emotionally Abused: No    Physically Abused: No    Sexually Abused: No    Outpatient Medications Prior to Visit  Medication Sig Dispense Refill   albuterol (PROVENTIL) (2.5 MG/3ML) 0.083% nebulizer solution USE 1 VIAL IN NEBULIZER EVERY 6 HOURS AS NEEDED FOR WHEEZING AND FOR SHORTNESS OF BREATH 150 mL 0   albuterol (VENTOLIN HFA) 108 (90 Base) MCG/ACT inhaler Inhale 2 puffs into the lungs every 6 (six) hours as needed for wheezing or shortness of breath. 6.7 g 2    allopurinol (ZYLOPRIM) 100 MG tablet Take 2 tablets (200 mg total) by mouth daily. 180 tablet 0   aspirin 81 MG EC tablet Take 1 tablet (81 mg total) by mouth daily. Swallow whole. 30 tablet 12   atorvastatin (LIPITOR) 80 MG tablet Take 1 tablet (80 mg total) by mouth daily at 6 PM 90 tablet 1   Blood Glucose Monitoring Suppl (ACCU-CHEK GUIDE ME) w/Device KIT Use to test blood sugar in the morning, at noon, and at bedtime. 1 kit 0   clobetasol ointment (TEMOVATE) 0.05 % Apply 1 Application topically 2 (two) times daily. For up to 2 weeks. 30 g 0   colchicine 0.6 MG tablet Take 0.6 mg by mouth daily as needed (gout).     diazepam (VALIUM) 10 MG tablet Take 1 tablet (10 mg total) by mouth every 12 (twelve) hours as needed for anxiety. 60 tablet 0   dicyclomine (BENTYL) 10 MG capsule Take 1 capsule (10 mg total) by mouth 2 (two) times daily. 60 capsule 5   escitalopram (LEXAPRO) 20 MG tablet Take 1 tablet (20 mg total) by mouth daily with lunch 90 tablet 1   ezetimibe (ZETIA) 10 MG tablet Take 1 tablet (10 mg total) by mouth daily with lunch. 90 tablet 0   famotidine (PEPCID) 20 MG tablet Take 1 tablet (20 mg total) by mouth in the morning and at bedtime. (8 am & 10 pm) 180 tablet 0   isosorbide mononitrate (IMDUR) 60 MG 24 hr tablet Take 1 tablet (60 mg total) by mouth daily at 6 PM. 90 tablet 0   levothyroxine (SYNTHROID) 25 MCG tablet Take 1 tablet (25 mcg total) by mouth before breakfast. 90 tablet 1   lisinopril (ZESTRIL) 2.5 MG tablet Take 1 tablet (2.5 mg total) by mouth daily with lunch. 90 tablet 0   meclizine (ANTIVERT) 25 MG tablet Take 1 tablet (25 mg total) by mouth 3 (three) times daily as needed for dizziness. 90 tablet 0   metoprolol succinate (TOPROL-XL) 25 MG 24 hr tablet Take 0.5 tablets (12.5 mg total) by mouth every evening. 45 tablet 1   nitroGLYCERIN (NITROSTAT) 0.4 MG SL tablet Place 1 tablet (0.4 mg total) under the tongue every 5 (five) minutes as needed for chest pain.  45  tablet 3   nystatin (MYCOSTATIN/NYSTOP) powder Apply 1 Application topically to the affected area(s) 2 (two) times daily. 60 g 4   potassium chloride SA (KLOR-CON M) 20 MEQ tablet Take 1 tablet (20 mEq total) by mouth daily at 12 noon. 90 tablet 1   Probiotic Product (PROBIOTIC DAILY) CAPS Take 1 tablet by mouth daily. 90 capsule 0   Respiratory Therapy Supplies (NEBULIZER/TUBING/MOUTHPIECE) KIT Use as directed 1 kit 0   sucralfate (CARAFATE) 1 g tablet Take 1 tablet (1 g total) by mouth daily. Take one hour prior to morning medications. 30 tablet 9   valACYclovir (VALTREX) 500 MG tablet Take 1 tablet (500 mg total) by mouth every other day. 45 tablet 1   No facility-administered medications prior to visit.    Allergies  Allergen Reactions   Ibuprofen Other (See Comments)    Pt has hx of peptic ulcer and diverticulitis.     ROS See HPI    Objective:    Physical Exam Constitutional:      General: She is not in acute distress.    Appearance: Normal appearance. She is well-developed.  HENT:     Head: Normocephalic and atraumatic.     Right Ear: External ear normal.     Left Ear: External ear normal.  Eyes:     General: No scleral icterus. Neck:     Thyroid: No thyromegaly.  Cardiovascular:     Rate and Rhythm: Normal rate and regular rhythm.     Heart sounds: Normal heart sounds. No murmur heard. Pulmonary:     Effort: Pulmonary effort is normal. No respiratory distress.     Breath sounds: Normal breath sounds. No wheezing.  Abdominal:     Tenderness: There is abdominal tenderness in the right upper quadrant, epigastric area and left upper quadrant.  Musculoskeletal:     Cervical back: Neck supple.  Skin:    General: Skin is warm and dry.  Neurological:     Mental Status: She is alert and oriented to person, place, and time.  Psychiatric:        Mood and Affect: Mood normal.        Behavior: Behavior normal.        Thought Content: Thought content normal.         Judgment: Judgment normal.      BP 103/72 (BP Location: Right Arm, Patient Position: Sitting, Cuff Size: Normal)   Pulse 92   Temp 97.6 F (36.4 C) (Oral)   Resp 16   Ht 5\' 4"  (1.626 m)   SpO2 95%   BMI 34.67 kg/m  Wt Readings from Last 3 Encounters:  04/22/23 202 lb (91.6 kg)  03/21/23 197 lb 6.4 oz (89.5 kg)  02/24/23 197 lb 2 oz (89.4 kg)       Assessment & Plan:   Problem List Items Addressed This Visit       Unprioritized   Generalized abdominal pain   Chronic.  Chronic intermittent mid-upper abdominal pain likely related to constipation, as she does not have daily bowel movements and reports relief after defecation. Prior evaluations including EGD, CT, and ultrasound which have all been unremarkable. A follow-up with GI is scheduled for May 1st. - Recommend daily Metamucil to improve bowel regularity - Order liver function tests and serum amylase/lipase to rule out pancreatitis - Follow-up with GI on May 1st -unfortunately, she did not stop by the lab for blood draw on her way out.  Relevant Orders   CBC w/Diff   Lipase   Comp Met (CMET)   Dysuria   Could not urinate, advised to bring back specimen cup tomorrow.        Relevant Orders   Urinalysis, Routine w reflex microscopic   Urine Culture   Controlled type 2 diabetes mellitus with complication, without long-term current use of insulin (HCC) - Primary   Relevant Orders   Urine Microalbumin w/creat. ratio   Ambulatory referral to Ophthalmology   AMB Referral [facility] Care Management  She is having transportation issues to appointments and I have made a referral to SW to hopefully help with this. Unfortunately, she and her daughter walked to today's appointment about a mile  I am having Veronica Wolf "Veronica Wolf" maintain her colchicine, aspirin EC, nitroGLYCERIN, albuterol, Nebulizer/Tubing/Mouthpiece, Accu-Chek Guide Me, nystatin, clobetasol ointment, albuterol, Probiotic Daily, meclizine, dicyclomine,  famotidine, valACYclovir, metoprolol succinate, levothyroxine, potassium chloride SA, escitalopram, atorvastatin, sucralfate, diazepam, isosorbide mononitrate, ezetimibe, lisinopril, and allopurinol.  No orders of the defined types were placed in this encounter.

## 2023-07-06 ENCOUNTER — Other Ambulatory Visit

## 2023-07-06 ENCOUNTER — Other Ambulatory Visit: Payer: Self-pay | Admitting: Family

## 2023-07-06 DIAGNOSIS — R300 Dysuria: Secondary | ICD-10-CM | POA: Diagnosis not present

## 2023-07-06 LAB — URINALYSIS, ROUTINE W REFLEX MICROSCOPIC
Bilirubin Urine: NEGATIVE
Ketones, ur: NEGATIVE
Nitrite: NEGATIVE
Specific Gravity, Urine: 1.02 (ref 1.000–1.030)
Urine Glucose: NEGATIVE
Urobilinogen, UA: 0.2 (ref 0.0–1.0)
pH: 6 (ref 5.0–8.0)

## 2023-07-06 LAB — MICROALBUMIN / CREATININE URINE RATIO
Creatinine,U: 204.8 mg/dL
Microalb Creat Ratio: 17.8 mg/g (ref 0.0–30.0)
Microalb, Ur: 3.7 mg/dL — ABNORMAL HIGH (ref 0.0–1.9)

## 2023-07-06 NOTE — Telephone Encounter (Signed)
 UA suggests UTI.  I would like her to start keflex (pended), please ask her what pharmacy she prefers.

## 2023-07-07 ENCOUNTER — Other Ambulatory Visit: Payer: Self-pay | Admitting: Family

## 2023-07-07 ENCOUNTER — Other Ambulatory Visit (HOSPITAL_BASED_OUTPATIENT_CLINIC_OR_DEPARTMENT_OTHER): Payer: Self-pay

## 2023-07-07 ENCOUNTER — Other Ambulatory Visit: Payer: Self-pay

## 2023-07-07 ENCOUNTER — Other Ambulatory Visit (HOSPITAL_COMMUNITY): Payer: Self-pay

## 2023-07-07 MED ORDER — CEPHALEXIN 500 MG PO CAPS
500.0000 mg | ORAL_CAPSULE | Freq: Two times a day (BID) | ORAL | 0 refills | Status: DC
  Filled 2023-07-07 (×2): qty 10, 5d supply, fill #0

## 2023-07-07 MED ORDER — FAMOTIDINE 20 MG PO TABS
20.0000 mg | ORAL_TABLET | Freq: Two times a day (BID) | ORAL | 0 refills | Status: DC
  Filled 2023-07-07: qty 180, 90d supply, fill #0
  Filled 2023-07-12: qty 60, 30d supply, fill #0
  Filled 2023-07-26 – 2023-08-12 (×3): qty 60, 30d supply, fill #1
  Filled 2023-08-31 – 2023-09-06 (×2): qty 60, 30d supply, fill #2

## 2023-07-07 MED ORDER — DIAZEPAM 10 MG PO TABS
10.0000 mg | ORAL_TABLET | Freq: Two times a day (BID) | ORAL | 0 refills | Status: DC | PRN
  Filled 2023-07-07: qty 60, 30d supply, fill #0

## 2023-07-07 NOTE — Telephone Encounter (Signed)
 Patient notified of results and prescription. She requested [facility] pharmacy due to delivering medication to her home.

## 2023-07-07 NOTE — Telephone Encounter (Signed)
 Requesting: Valium 10 mg Contract: 07/26/2022 UDS: 07/26/2022 Last Visit: 07/05/2023 Next Visit: N/A Last Refill: 06/08/2023  Please Advise

## 2023-07-08 ENCOUNTER — Other Ambulatory Visit (HOSPITAL_COMMUNITY): Payer: Self-pay

## 2023-07-08 ENCOUNTER — Other Ambulatory Visit: Payer: Self-pay

## 2023-07-08 ENCOUNTER — Ambulatory Visit: Payer: Self-pay

## 2023-07-08 LAB — URINE CULTURE
MICRO NUMBER:: 16279474
SPECIMEN QUALITY:: ADEQUATE

## 2023-07-08 NOTE — Telephone Encounter (Signed)
  No triage: Pt asking when her medications will delivered and how to take Cephalexin. RN explained to take twice daily 12 hours apart. Pt verbalized understanding. Rest of medications will be delivered on 4/8. Pt had no further concerns.           Copied from CRM [id number]. Topic: Clinical - Prescription Issue >> Jul 08, 2023 10:38 AM Florestine Avers wrote: Reason for CRM: Patient called in requesting a call back to go over her medications with her, she is unsure of the names of her medications and what they are for. She also states that [facility] sent over a medication but she is unsure of the name of it and wants to know if that was sent as well. Patient is requesting an urgent call back from the Nurse. Reason for Disposition  Health Information question, no triage required and triager able to answer question  Answer Assessment - Initial Assessment Questions 1. REASON FOR CALL or QUESTION: "What is your reason for calling today?" or "How can I best help you?" or "What question do you have that I can help answer?"     Pt calling to find out how long until medications will be delivered.  Protocols used: Information Only Call - No Triage-A-AH

## 2023-07-09 ENCOUNTER — Telehealth: Payer: Self-pay | Admitting: Family

## 2023-07-10 NOTE — Telephone Encounter (Signed)
 Opened in error

## 2023-07-11 ENCOUNTER — Telehealth: Payer: Self-pay

## 2023-07-11 NOTE — Progress Notes (Signed)
 Complex Care Management Note  Care Guide Note 07/11/2023 Name: Veronica Wolf MRN: [id number] DOB: 1940/09/15  [name] is a 83 y.o. year old female who sees [name], NP for primary care. I reached out to [name] by phone today to offer complex care management services.  [name] was given information about Complex Care Management services today including:   The Complex Care Management services include support from the care team which includes your Nurse Care Manager, Clinical Social Worker, or Pharmacist.  The Complex Care Management team is here to help remove barriers to the health concerns and goals most important to you. Complex Care Management services are voluntary, and the patient may decline or stop services at any time by request to their care team member.   Complex Care Management Consent Status: Patient agreed to services and verbal consent obtained.   Follow up plan:  Telephone appointment with complex care management team member scheduled for:  07/20/23 at 11:00 a.m.   Encounter Outcome:  Patient Scheduled  [facility]  [facility] Health Care Management Assistant Direct Dial: [phone]  Fax: [phone]

## 2023-07-12 ENCOUNTER — Other Ambulatory Visit: Payer: Self-pay

## 2023-07-13 ENCOUNTER — Telehealth: Payer: Self-pay | Admitting: *Deleted

## 2023-07-13 ENCOUNTER — Ambulatory Visit (INDEPENDENT_AMBULATORY_CARE_PROVIDER_SITE_OTHER): Admitting: Pharmacist

## 2023-07-13 DIAGNOSIS — R1084 Generalized abdominal pain: Secondary | ICD-10-CM

## 2023-07-13 DIAGNOSIS — Z79899 Other long term (current) drug therapy: Secondary | ICD-10-CM

## 2023-07-13 NOTE — Progress Notes (Signed)
 05/18/2023 Name: Veronica Wolf MRN: [id number] DOB: Feb 28, 1941 Chief Complaint  Patient presents with   Medication Management    Veronica Wolf is a 83 y.o. year old female who was referred for medication management by their primary care provider, Veronica Craze, NP.       Subjective:  Called today for follow up with patient regarding medication management.  She was seen by PCP last week 07/05/2023. Prescribed cephalexin for UTI. She has finished 5 day course of cephalexin. She reports that she is still having pain in her stomach. I aksed if she has been taking her packaged medications regularly and she reports that she has somehow messed up her packages and she is not sure she is taking correctly.    She reports having a good BM yesterday 07/12/2023 in the evening.   Since patient has limited transportation, made a home visit to review her packages.   Medication Access/Adherence  Current Pharmacy:  [facility] [address] [place] [address] Phone: [phone] Fax: [phone]  Patient reports affordability concerns with their medications: No  Patient reports access/transportation concerns to their pharmacy:  medications are delivered to her - if needed her daughter can pick up medications Patient reports adherence concerns with their medications:  Yes  - missed 1 week of metoprolol when she forgot to take the medication with her when she left for out of town trip.    Current adherence strategy: uses adherence packaging - she has received a new box of packaged meds this week. She is due to start new packages 06/23/2023 - per patient.  Packaging should have this schedule:  Morning 8am: allopurinol 100mg   - take 2 tablets, dicyclomine 10mg , famotidine 20mg , levothyroxine 25 mcg, sucralfate 1 gram Lunch- 12pm / noon:  potassium, ezetimibe, lisinopril, escitalopram Supper 6pm - atorvastatin 80mg , isosorbide Er 30mg  Night / 10pm:  famotidine, dicyclomine  She takes the following medications which are not included in her packaged medication:  aspirin 81mg  daily at night, metoprolol ER 25mg  0.5 tablet at night, diazepam as needed and valacyclovir every other night.  Patient keeps these medications by her bed to take because they cannot be included in packaging   She did miss 1 day of her 10 PM packaging from what I can tell.   Objective:  Lab Results  Component Value Date   HGBA1C 6.8 (H) 04/22/2023    Lab Results  Component Value Date   CREATININE 1.49 (H) 04/22/2023   BUN 11 04/22/2023   NA 142 04/22/2023   K 4.4 04/22/2023   CL 106 04/22/2023   CO2 29 04/22/2023    Lab Results  Component Value Date   CHOL 242 (H) 04/22/2023   HDL 45.50 04/22/2023   LDLCALC 153 (H) 04/22/2023   TRIG 217.0 (H) 04/22/2023   CHOLHDL 5 04/22/2023    Medications Reviewed Today     Reviewed by [name], RPH-CPP (Pharmacist) on 07/13/23 at 1800  Med List Status: <None>   Medication Order Taking? Sig Documenting Provider Last Dose Status Informant  albuterol (PROVENTIL) (2.5 MG/3ML) 0.083% nebulizer solution [id number] No USE 1 VIAL IN NEBULIZER EVERY 6 HOURS AS NEEDED FOR WHEEZING AND FOR SHORTNESS OF BREATH  Patient not taking: Reported on 07/13/2023   Veronica Craze, NP Not Taking Active   albuterol (VENTOLIN HFA) 108 (90 Base) MCG/ACT inhaler [id number] No Inhale 2 puffs into the lungs every 6 (six) hours as needed for wheezing or shortness of breath.  Patient not taking: Reported on 07/13/2023   Veronica Craze, NP Not Taking Active   allopurinol (ZYLOPRIM) 100 MG tablet [id number] Yes Take 2 tablets (200 mg total) by mouth daily. Veronica Craze, NP Taking Active   aspirin 81 MG EC tablet [id number] Yes Take 1 tablet (81 mg total) by mouth daily. Swallow whole. Thomasene Ripple, DO Taking Active            Med Note Clydie Braun, Laren Orama B   Wed Oct 07, 2021 11:19 AM)    atorvastatin (LIPITOR) 80 MG tablet [id number]  Yes Take 1 tablet (80 mg total) by mouth daily at 6 PM Veronica Craze, NP Taking Active   Blood Glucose Monitoring Suppl (ACCU-CHEK GUIDE ME) w/Device KIT [id number] No Use to test blood sugar in the morning, at noon, and at bedtime.  Patient not taking: Reported on 07/13/2023   Veronica Craze, NP Not Taking Active   clobetasol ointment (TEMOVATE) 0.05 % [id number]  Apply 1 Application topically 2 (two) times daily. For up to 2 weeks. Veronica Craze, NP  Active   colchicine 0.6 MG tablet [id number]  Take 0.6 mg by mouth daily as needed (gout). [provider]  Active   diazepam (VALIUM) 10 MG tablet [id number] Yes Take 1 tablet (10 mg total) by mouth every 12 (twelve) hours as needed for anxiety. Veronica Craze, NP Taking Active   dicyclomine (BENTYL) 10 MG capsule [id number] Yes Take 1 capsule (10 mg total) by mouth 2 (two) times daily. Meredith Pel, NP Taking Active   escitalopram (LEXAPRO) 20 MG tablet [id number] Yes Take 1 tablet (20 mg total) by mouth daily with lunch Veronica Craze, NP Taking Active   ezetimibe (ZETIA) 10 MG tablet [id number] Yes Take 1 tablet (10 mg total) by mouth daily with lunch. Veronica Craze, NP Taking Active   famotidine (PEPCID) 20 MG tablet [id number] Yes Take 1 tablet (20 mg total) by mouth in the morning and at bedtime. Veronica Craze, NP Taking Active   isosorbide mononitrate (IMDUR) 60 MG 24 hr tablet [id number] Yes Take 1 tablet (60 mg total) by mouth daily at 6 PM. Veronica Craze, NP Taking Active   levothyroxine (SYNTHROID) 25 MCG tablet [id number] Yes Take 1 tablet (25 mcg total) by mouth before breakfast. Bradd Canary, MD Taking Active   lisinopril (ZESTRIL) 2.5 MG tablet [id number] Yes Take 1 tablet (2.5 mg total) by mouth daily with lunch. Veronica Craze, NP Taking Active   meclizine (ANTIVERT) 25 MG tablet [id number]  Take 1 tablet (25 mg total) by mouth 3 (three) times daily as needed for dizziness.  Veronica Craze, NP  Active   metoprolol succinate (TOPROL-XL) 25 MG 24 hr tablet [id number] Yes Take 0.5 tablets (12.5 mg total) by mouth every evening. Veronica Craze, NP Taking Active            Med Note Clydie Braun, Meili Kleckley B   Thu May 19, 2023 12:52 PM)    nitroGLYCERIN (NITROSTAT) 0.4 MG SL tablet [id number]  Place 1 tablet (0.4 mg total) under the tongue every 5 (five) minutes as needed for chest pain. Tobb, Kardie, DO  Active   nystatin (MYCOSTATIN/NYSTOP) powder [id number]  Apply 1 Application topically to the affected area(s) 2 (two) times daily. Veronica Craze, NP  Active   potassium chloride SA (KLOR-CON M) 20 MEQ tablet [id number] Yes Take 1 tablet (20 mEq total) by mouth daily at 12 noon. Veronica Craze, NP Taking Active   Probiotic Product (PROBIOTIC DAILY) CAPS [id number] No Take 1 tablet by  mouth daily.  Patient not taking: Reported on 07/13/2023   Clayborne Dana, NP Not Taking Active   Respiratory Therapy Supplies (NEBULIZER/TUBING/MOUTHPIECE) KIT [id number]  Use as directed Veronica Craze, NP  Active   sucralfate (CARAFATE) 1 g tablet [id number] Yes Take 1 tablet (1 g total) by mouth daily. Take one hour prior to morning medications. Meredith Pel, NP Taking Active   valACYclovir (VALTREX) 500 MG tablet [id number] Yes Take 1 tablet (500 mg total) by mouth every other day. Veronica Craze, NP Taking Active               Assessment/Plan:  Abdominal Pain:  - Patient is due to take her dicyclomine and famotidine doses soon. Recommended she make sure she takes 6pm and 10am doses. If she continues to have abdominal pain she should contact her PCP.  - Discussed upcoming appointment with GI. Wrote down information for patient - Thursday, May 1st 2025 at 10am with [facility] on [address] with Deanna May, NP  Medication Management: - Reviewed  medications and packaging with patient. She actually appear to be taking medications as prescribed on most days.  -  Helped her with labeling her packages so that she can more easily see the date to take each medication. - Reminded patient that her current packaging will last until 07/22/2023. Looks like pharmacy is already working on her next adherence packaging.    Follow Up Plan: 2 to 4 weeks.   [name], PharmD Clinical Pharmacist [facility]

## 2023-07-14 NOTE — Progress Notes (Signed)
 Complex Care Management Note Care Guide Note  07/14/2023 Name: Veronica Wolf MRN: [id number] DOB: 08-May-1940  Veronica Wolf is a 83 y.o. year old female who is a primary care patient of [name], NP . The community resource team was consulted for assistance with Transportation Needs  Food  SDOH screenings and interventions completed:  Yes  Social Drivers of Health From This Encounter   Food Insecurity: Food Insecurity Present (07/13/2023)   Hunger Vital Sign    Worried About Running Out of Food in the Last Year: Sometimes true    Ran Out of Food in the Last Year: Often true  Transportation Needs: Unmet Transportation Needs (07/13/2023)   PRAPARE - Administrator, Civil Service (Medical): Yes    Lack of Transportation (Non-Medical): Yes    SDOH Interventions Today    Flowsheet Row Most Recent Value  SDOH Interventions   Food Insecurity Interventions Community Resources Provided  Transportation Interventions Community Resources Provided  [sent [place] transportation]        Care guide performed the following interventions: Patient provided with information about care guide support team and interviewed to confirm resource needs.  Follow Up Plan:  No further follow up planned at this time. The patient has been provided with needed resources.  Encounter Outcome:  Patient Visit Completed Dione Booze  [facility]  Direct Dial:[phone] Fax:[phone] Website: [email or web address]

## 2023-07-15 ENCOUNTER — Other Ambulatory Visit: Payer: Self-pay

## 2023-07-15 ENCOUNTER — Other Ambulatory Visit (HOSPITAL_COMMUNITY): Payer: Self-pay

## 2023-07-15 ENCOUNTER — Ambulatory Visit: Payer: Self-pay

## 2023-07-15 MED ORDER — SUCRALFATE 1 G PO TABS: 1.0000 g | ORAL_TABLET | Freq: Three times a day (TID) | ORAL | 1 refills | Status: DC

## 2023-07-15 MED ORDER — SUCRALFATE 1 G PO TABS
1.0000 g | ORAL_TABLET | Freq: Three times a day (TID) | ORAL | 1 refills | Status: DC
  Filled 2023-07-15 – 2023-07-20 (×4): qty 120, 30d supply, fill #0
  Filled 2023-07-26 – 2023-08-12 (×3): qty 120, 30d supply, fill #1

## 2023-07-15 MED ORDER — SUCRALFATE 1 G PO TABS
1.0000 g | ORAL_TABLET | Freq: Every day | ORAL | 9 refills | Status: DC
  Filled 2023-07-15: qty 30, 30d supply, fill #0

## 2023-07-15 NOTE — Telephone Encounter (Addendum)
 Rx sent to [facility] [address] for carafate. If pain worsens over the weekend she should go to the ER.

## 2023-07-15 NOTE — Addendum Note (Signed)
 Addended by: [name] on: 07/15/2023 11:39 AM   Modules accepted: Orders

## 2023-07-15 NOTE — Telephone Encounter (Signed)
 Information given to patient's daughter Noreene Larsson. She was advised rx sent to [facility] pharmacy and go to ed if not better

## 2023-07-15 NOTE — Telephone Encounter (Signed)
 Copied from CRM [id number]. Topic: Clinical - Red Word Triage >> Jul 15, 2023  9:46 AM Marica Otter wrote: [facility] that prompted transfer to Nurse Triage: Terrible excruciating pains in stomach, states Melissa sent something to pharmacy previously for issue. Patient had issue before but it's back now  Chief Complaint: Stomach pain Symptoms: Nausea, diarrhea 2 days ago Frequency: Chronic, exacerbation today Pertinent Negatives: Patient denies fever Disposition: [] ED /[] Urgent Care (no appt availability in office) / [x] Appointment(In office/virtual)/ []  [facility]/ [] Home Care/ [x] Refused Recommended Disposition /[] [facility]/ []  Follow-up with PCP Additional Notes: Patient called in to report "stomach" pain. Patient rated pain a 7 at this time. Patient stated she deals with chronic stomach pain, but this is an exacerbation. Patient stated pain comes and goes, but has been present since this morning. Patient reported nausea. Patient declined appointment. Patient stated she also did not want to go to hospital. Patient is requesting medication to help with stomach pain. Patient stated her PCP prescribed her a medication to help with pain and is requesting a refill of this medication. Patient could not recall the name of the medication. Patient stated medication was prescribed "2-3 days" ago and bottle contained "5-6" pills. Unable to determine what medication patient is referring to, based on MAR. Patient would like a call back confirming whether or not medication can be prescribed. Please advise.   Reason for Disposition  [1] MILD-MODERATE pain AND [2] constant AND [3] present > 2 hours  Answer Assessment - Initial Assessment Questions 1. LOCATION: "Where does it hurt?"      "Stomach" 2. RADIATION: "Does the pain shoot anywhere else?" (e.g., chest, back)     Denies 3. ONSET: "When did the pain begin?" (e.g., minutes, hours or days ago)      Chronic issue, worsened this  morning 4. SUDDEN: "Gradual or sudden onset?"     Sudden onset this morning 5. PATTERN "Does the pain come and go, or is it constant?"    - If it comes and goes: "How long does it last?" "Do you have pain now?"     (Note: Comes and goes means the pain is intermittent. It goes away completely between bouts.)    - If constant: "Is it getting better, staying the same, or getting worse?"      (Note: Constant means the pain never goes away completely; most serious pain is constant and gets worse.)      States pain comes and goes 6. SEVERITY: "How bad is the pain?"  (e.g., Scale 1-10; mild, moderate, or severe)    - MILD (1-3): Doesn't interfere with normal activities, abdomen soft and not tender to touch.     - MODERATE (4-7): Interferes with normal activities or awakens from sleep, abdomen tender to touch.     - SEVERE (8-10): Excruciating pain, doubled over, unable to do any normal activities.       Rates pain a 7 at this time 7. RECURRENT SYMPTOM: "Have you ever had this type of stomach pain before?" If Yes, ask: "When was the last time?" and "What happened that time?"      Yes 8. CAUSE: "What do you think is causing the stomach pain?"     Unknown 9. RELIEVING/AGGRAVATING FACTORS: "What makes it better or worse?" (e.g., antacids, bending or twisting motion, bowel movement)     Pepto-bismol provides minor relief 10. OTHER SYMPTOMS: "Do you have any other symptoms?" (e.g., back pain, diarrhea, fever, urination pain, vomiting)  Nausea, diarrhea two days ago, denies fever, denies vomiting  Protocols used: Abdominal Pain - Female-A-AH

## 2023-07-16 ENCOUNTER — Other Ambulatory Visit (HOSPITAL_COMMUNITY): Payer: Self-pay

## 2023-07-18 ENCOUNTER — Other Ambulatory Visit (HOSPITAL_COMMUNITY): Payer: Self-pay

## 2023-07-18 ENCOUNTER — Telehealth: Payer: Self-pay

## 2023-07-18 NOTE — Telephone Encounter (Signed)
 Initial Comment Caller states she is having chest pain. Translation No Nurse Assessment Nurse: Toi Foster, RN, Olivia Date/Time (Eastern Time): 07/17/2023 6:10:42 PM Confirm and document reason for call. If symptomatic, describe symptoms. ---Caller reports terrible chest pain. States that chest pain started about an hour ago. States that she has had before. States that she has not eaten very much or had much to drink. Does the patient have any new or worsening symptoms? ---Yes Will a triage be completed? ---Yes Related visit to physician within the last 2 weeks? ---No Does the PT have any chronic conditions? (i.e. diabetes, asthma, this includes High risk factors for pregnancy, etc.) ---Yes List chronic conditions. ---stomach issues Is this a behavioral health or substance abuse call? ---No Guidelines Guideline Title Affirmed Question Affirmed Notes Nurse Date/Time [facility] Time) Chest Pain [1] Chest pain lasts > 5 minutes AND [2] age > 13 Shurden, RN, Tawny Fate 07/17/2023 6:13:13 PM Disp. Time [facility] Time) Disposition Final User 07/17/2023 6:08:56 PM Send to Urgent Queue Bentley Bray 07/17/2023 6:18:31 PM Call EMS 911 Now Yes Shurden, RN, Tawny Fate PLEASE NOTE: All timestamps contained within this report are represented as [place] Standard Time. CONFIDENTIALTY NOTICE: This fax transmission is intended only for the addressee. It contains information that is legally privileged, confidential or otherwise protected from use or disclosure. If you are not the intended recipient, you are strictly prohibited from reviewing, disclosing, copying using or disseminating any of this information or taking any action in reliance on or regarding this information. If you have received this fax in error, please notify us  immediately by telephone so that we can arrange for its return to us . Phone: [phone], Toll-Free: [phone], Fax: [phone] LOIS_HARRIS [birth year] Page: 2 of 2 CallId:  [id number] Disp. Time [facility] Time) Disposition Final User 07/17/2023 6:30:58 PM 911 Outcome Documentation Shurden, RN, Tawny Fate Reason: Caller refused to call 911, dtr transporting to ED Final Disposition 07/17/2023 6:18:31 PM Call EMS 911 Now Yes Shurden, RN, Tawny Fate Caller Disagree/Comply Comply Caller Understands Yes PreDisposition InappropriateToAsk Care Advice Given Per Guideline CALL EMS 911 NOW: * Immediate medical attention is needed. You need to hang up and call 911 (or an ambulance). CARE ADVICE given per Chest Pain (Adult) guideline

## 2023-07-18 NOTE — Telephone Encounter (Signed)
 Called patient to see how she was doing. She reports her "family" told her to stay at home and "drink some stuff". She is feeling better from the chest pain but she is still having abdominal pain and is requesting an appointment. Was scheduled to come in tomorrow. She will try to get transportation.

## 2023-07-19 ENCOUNTER — Ambulatory Visit (INDEPENDENT_AMBULATORY_CARE_PROVIDER_SITE_OTHER): Admitting: Family

## 2023-07-19 ENCOUNTER — Other Ambulatory Visit (HOSPITAL_BASED_OUTPATIENT_CLINIC_OR_DEPARTMENT_OTHER): Payer: Self-pay

## 2023-07-19 VITALS — BP 118/63 | HR 81 | Temp 98.7°F | Resp 16 | Ht 64.0 in | Wt 193.0 lb

## 2023-07-19 DIAGNOSIS — K5900 Constipation, unspecified: Secondary | ICD-10-CM | POA: Diagnosis not present

## 2023-07-19 DIAGNOSIS — N760 Acute vaginitis: Secondary | ICD-10-CM

## 2023-07-19 MED ORDER — SENNA 8.6 MG PO TABS: 1.0000 | ORAL_TABLET | Freq: Every day | ORAL | Status: AC

## 2023-07-19 MED ORDER — FLUCONAZOLE 150 MG PO TABS
150.0000 mg | ORAL_TABLET | Freq: Once | ORAL | 0 refills | Status: DC
  Filled 2023-07-19: qty 1, 1d supply, fill #0

## 2023-07-19 NOTE — Assessment & Plan Note (Addendum)
 Abdominal pain seems to worsen anytime she is constipated and resolve after a good BM. Recommend that she add senna one tab once daily HS to promote regularity. It can be added to her pill boxes to help her remember. She is advised to go to the ER if she develops worsening pain or is unable to keep down food/liquid.

## 2023-07-19 NOTE — Progress Notes (Unsigned)
   Established Patient Office Visit  Subjective   Patient ID: Veronica Wolf, female    DOB: 19-Nov-1940  Age: 83 y.o. MRN: [id number]  Chief Complaint  Patient presents with   Abdominal Pain    Patient complains of still having abdominal pain    Patient presents with acute on chronic epigastric abdominal pain. Reports the pain began this morning and has been constant. She also states she has had transient nausea with no vomiting. She also denies radiation, diaphoresis, chest pain, or dyspnea. Patient states these symptoms do not occur when she is having regular daily bowel movements; however, she has not had a "normal bowel movement" in 2 days. She reports that she has not been taking Metamucil as recommended.    Abdominal Pain Associated symptoms include constipation and nausea.  Review of Systems  Gastrointestinal:  Positive for abdominal pain, constipation and nausea.  See HPI   Objective:     BP 118/63 (BP Location: Right Arm, Patient Position: Sitting, Cuff Size: Normal)   Pulse 81   Temp 98.7 F (37.1 C) (Oral)   Resp 16   Ht 5\' 4"  (1.626 m)   Wt 193 lb (87.5 kg)   SpO2 100%   BMI 33.13 kg/m    Physical Exam Vitals reviewed.  Constitutional:      General: She is not in acute distress.    Appearance: She is well-developed. She is not ill-appearing.  HENT:     Head: Normocephalic and atraumatic.  Cardiovascular:     Rate and Rhythm: Normal rate and regular rhythm.     Pulses: Normal pulses.     Heart sounds: Normal heart sounds.  Pulmonary:     Effort: Pulmonary effort is normal.     Breath sounds: Normal breath sounds.  Abdominal:     General: Bowel sounds are normal.     Palpations: Abdomen is soft.     Tenderness: There is abdominal tenderness.     Comments: Reports tenderness in epigastric area  Skin:    General: Skin is warm and dry.     Capillary Refill: Capillary refill takes less than 2 seconds.  Neurological:     Mental Status: She is alert and  oriented to person, place, and time.  Psychiatric:        Mood and Affect: Mood normal.        Behavior: Behavior normal.      Assessment & Plan:   Abdominal pain - chronic with acute exacerbation. Reports that abdominal pain only occurs when she has not had a bowel movement. Recommended patient begin taking Senna one tab each night.    Wilford Hanks, RN

## 2023-07-19 NOTE — Progress Notes (Unsigned)
 Subjective:     Patient ID: Veronica Wolf, female    DOB: Sep 10, 1940, 83 y.o.   MRN: [id number]  Chief Complaint  Patient presents with   Abdominal Pain    Patient complains of still having abdominal pain    Abdominal Pain    Discussed the use of AI scribe software for clinical note transcription with the patient, who gave verbal consent to proceed.  History of Present Illness An 83 year old female who presents with epigastric pain and bowel movement irregularities. She experiences epigastric pain that worsens with palpation and is associated with nausea.   She feels comfortable when having regular bowel movements, but she has not had a normal bowel movement in the last two days. She has a long standing history of gastrointestinal issues and had a GI follow up appointment in May. We saw her on 4/1 with the same complaint and advised her to start Metamucil. She has taken Metamucil once following her last visit.   She reports external vaginal itching.   Health Maintenance Due  Topic Date Due   OPHTHALMOLOGY EXAM  Never done   Zoster Vaccines- Shingrix (1 of 2) Never done   COVID-19 Vaccine (3 - Pfizer risk series) 07/06/2019   Medicare Annual Wellness (AWV)  06/09/2023    Past Medical History:  Diagnosis Date   Allergic rhinitis    Allergy    seasonal allergies   Anxiety    CKD (chronic kidney disease) 04/04/2017   COPD (chronic obstructive pulmonary disease) (HCC)    uses inhaler   Depression    Fatty liver    GERD (gastroesophageal reflux disease)    Gout    hx of   Hyperlipidemia    on meds   Hypertension    on meds   Myocardial infarction [facility])    Peptic ulcer 01/04/2003   Vertigo     Past Surgical History:  Procedure Laterality Date   ABDOMINAL HYSTERECTOMY     APPENDECTOMY     BIOPSY  08/13/2020   Procedure: BIOPSY;  Surgeon: Shellia Cleverly, DO;  Location: [facility];  Service: Gastroenterology;;   CORONARY PRESSURE/FFR STUDY N/A 01/27/2022    Procedure: INTRAVASCULAR PRESSURE WIRE/FFR STUDY;  Surgeon: Orbie Pyo, MD;  Location: [facility] INVASIVE CV LAB;  Service: Cardiovascular;  Laterality: N/A;   ESOPHAGOGASTRODUODENOSCOPY (EGD) WITH PROPOFOL N/A 08/13/2020   Procedure: ESOPHAGOGASTRODUODENOSCOPY (EGD) WITH PROPOFOL;  Surgeon: Shellia Cleverly, DO;  Location: [facility];  Service: Gastroenterology;  Laterality: N/A;   LEFT HEART CATH AND CORONARY ANGIOGRAPHY N/A 06/13/2017   Procedure: LEFT HEART CATH AND CORONARY ANGIOGRAPHY;  Surgeon: Marykay Lex, MD;  Location[facility] INVASIVE CV LAB;  Service: Cardiovascular;  Laterality: N/A;   LEFT HEART CATH AND CORONARY ANGIOGRAPHY N/A 01/27/2022   Procedure: LEFT HEART CATH AND CORONARY ANGIOGRAPHY;  Surgeon: Orbie Pyo, MD;  Location: [facility] INVASIVE CV LAB;  Service: Cardiovascular;  Laterality: N/A;   RIGHT/LEFT HEART CATH AND CORONARY ANGIOGRAPHY N/A 09/16/2017   Procedure: RIGHT/LEFT HEART CATH AND CORONARY ANGIOGRAPHY;  Surgeon: Swaziland, Peter M, MD;  Location: [facility] INVASIVE CV LAB;  Service: Cardiovascular;  Laterality: N/A;   SAVORY DILATION N/A 08/13/2020   Procedure: SAVORY DILATION;  Surgeon: Shellia Cleverly, DO;  Location: [facility];  Service: Gastroenterology;  Laterality: N/A;   ULTRASOUND GUIDANCE FOR VASCULAR ACCESS  09/16/2017   Procedure: Ultrasound Guidance For Vascular Access;  Surgeon: Swaziland, Peter M, MD;  Location: [facility] INVASIVE CV LAB;  Service: Cardiovascular;;   WISDOM  TOOTH EXTRACTION      Family History  Problem Relation Age of Onset   Breast cancer Mother    Stroke Father    Stomach cancer Neg Hx    Colon cancer Neg Hx    Pancreatic cancer Neg Hx    Esophageal cancer Neg Hx    Colon polyps Neg Hx    Rectal cancer Neg Hx     Social History   Socioeconomic History   Marital status: Widowed    Spouse name: Not on file   Number of children: 1   Years of education: 55   Highest education level: 12th grade  Occupational History   Occupation:  Retired  Tobacco Use   Smoking status: Former    Current packs/day: 0.00    Types: Cigarettes    Quit date: 04/24/2000    Years since quitting: 23.2    Passive exposure: Past   Smokeless tobacco: Never  Vaping Use   Vaping status: Never Used  Substance and Sexual Activity   Alcohol use: Not Currently    Comment: has previous hx of ETOH abuse, quit 2014   Drug use: No   Sexual activity: Not Currently  Other Topics Concern   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[place]   Grew up in [place]   Has daughter locally   Social Drivers of Health   Financial Resource Strain: Low Risk  (10/07/2021)   Overall Financial Resource Strain (CARDIA)    Difficulty of Paying Living Expenses: Not very hard  Food Insecurity: Food Insecurity Present (07/13/2023)   Hunger Vital Sign    Worried About Running Out of Food in the Last Year: Sometimes true    Ran Out of Food in the Last Year: Often true  Transportation Needs: Unmet Transportation Needs (07/13/2023)   PRAPARE - Administrator, Civil Service (Medical): Yes    Lack of Transportation (Non-Medical): Yes  Physical Activity: Inactive (05/13/2021)   Exercise Vital Sign    Days of Exercise per Week: 0 days    Minutes of Exercise per Session: 0 min  Stress: Stress Concern Present (05/13/2021)   [employer] of Occupational Health - Occupational Stress Questionnaire    Feeling of Stress : Rather much  Social Connections: Moderately Isolated (05/13/2021)   Social Connection and Isolation Panel [NHANES]    Frequency of Communication with Friends and Family: More than three times a week    Frequency of Social Gatherings with Friends and Family: More than three times a week    Attends Religious Services: 1 to 4 times per year    Active Member of [employer] or Organizations: No    Attends Banker Meetings: Never    Marital Status: Widowed  Intimate Partner Violence: Not At Risk (05/09/2023)    Humiliation, Afraid, Rape, and Kick questionnaire    Fear of Current or Ex-Partner: No    Emotionally Abused: No    Physically Abused: No    Sexually Abused: No    Outpatient Medications Prior to Visit  Medication Sig Dispense Refill   albuterol (PROVENTIL) (2.5 MG/3ML) 0.083% nebulizer solution USE 1 VIAL IN NEBULIZER EVERY 6 HOURS AS NEEDED FOR WHEEZING AND FOR SHORTNESS OF BREATH 150 mL 0   albuterol (VENTOLIN HFA) 108 (90 Base) MCG/ACT inhaler Inhale 2 puffs into the lungs every 6 (six) hours as needed for wheezing or shortness of breath. 6.7 g 2   allopurinol (ZYLOPRIM) 100  MG tablet Take 2 tablets (200 mg total) by mouth daily. 180 tablet 0   aspirin 81 MG EC tablet Take 1 tablet (81 mg total) by mouth daily. Swallow whole. 30 tablet 12   atorvastatin (LIPITOR) 80 MG tablet Take 1 tablet (80 mg total) by mouth daily at 6 PM 90 tablet 1   Blood Glucose Monitoring Suppl (ACCU-CHEK GUIDE ME) w/Device KIT Use to test blood sugar in the morning, at noon, and at bedtime. 1 kit 0   clobetasol ointment (TEMOVATE) 0.05 % Apply 1 Application topically 2 (two) times daily. For up to 2 weeks. 30 g 0   colchicine 0.6 MG tablet Take 0.6 mg by mouth daily as needed (gout).     diazepam (VALIUM) 10 MG tablet Take 1 tablet (10 mg total) by mouth every 12 (twelve) hours as needed for anxiety. 60 tablet 0   dicyclomine (BENTYL) 10 MG capsule Take 1 capsule (10 mg total) by mouth 2 (two) times daily. 60 capsule 5   escitalopram (LEXAPRO) 20 MG tablet Take 1 tablet (20 mg total) by mouth daily with lunch 90 tablet 1   ezetimibe (ZETIA) 10 MG tablet Take 1 tablet (10 mg total) by mouth daily with lunch. 90 tablet 0   famotidine (PEPCID) 20 MG tablet Take 1 tablet (20 mg total) by mouth in the morning and at bedtime. 180 tablet 0   isosorbide mononitrate (IMDUR) 60 MG 24 hr tablet Take 1 tablet (60 mg total) by mouth daily at 6 PM. 90 tablet 0   levothyroxine (SYNTHROID) 25 MCG tablet Take 1 tablet (25 mcg  total) by mouth before breakfast. 90 tablet 1   lisinopril (ZESTRIL) 2.5 MG tablet Take 1 tablet (2.5 mg total) by mouth daily with lunch. 90 tablet 0   meclizine (ANTIVERT) 25 MG tablet Take 1 tablet (25 mg total) by mouth 3 (three) times daily as needed for dizziness. 90 tablet 0   metoprolol succinate (TOPROL-XL) 25 MG 24 hr tablet Take 0.5 tablets (12.5 mg total) by mouth every evening. 45 tablet 1   nitroGLYCERIN (NITROSTAT) 0.4 MG SL tablet Place 1 tablet (0.4 mg total) under the tongue every 5 (five) minutes as needed for chest pain. 45 tablet 3   nystatin (MYCOSTATIN/NYSTOP) powder Apply 1 Application topically to the affected area(s) 2 (two) times daily. 60 g 4   potassium chloride SA (KLOR-CON M) 20 MEQ tablet Take 1 tablet (20 mEq total) by mouth daily at 12 noon. 90 tablet 1   Probiotic Product (PROBIOTIC DAILY) CAPS Take 1 tablet by mouth daily. 90 capsule 0   Respiratory Therapy Supplies (NEBULIZER/TUBING/MOUTHPIECE) KIT Use as directed 1 kit 0   sucralfate (CARAFATE) 1 g tablet Take 1 tablet (1 g total) by mouth 4 (four) times daily -  with meals and at bedtime. Take one hour prior to morning medications. 120 tablet 1   valACYclovir (VALTREX) 500 MG tablet Take 1 tablet (500 mg total) by mouth every other day. 45 tablet 1   No facility-administered medications prior to visit.    Allergies  Allergen Reactions   Ibuprofen Other (See Comments)    Pt has hx of peptic ulcer and diverticulitis.     Review of Systems  Gastrointestinal:  Positive for abdominal pain.       Objective:    Physical Exam Constitutional:      General: She is not in acute distress.    Appearance: Normal appearance. She is well-developed.  HENT:  Head: Normocephalic and atraumatic.     Right Ear: External ear normal.     Left Ear: External ear normal.  Eyes:     General: No scleral icterus. Neck:     Thyroid: No thyromegaly.  Cardiovascular:     Rate and Rhythm: Normal rate and regular  rhythm.     Heart sounds: Normal heart sounds. No murmur heard. Pulmonary:     Effort: Pulmonary effort is normal. No respiratory distress.     Breath sounds: Normal breath sounds. No wheezing.  Abdominal:     General: Bowel sounds are normal.     Tenderness: There is abdominal tenderness in the epigastric area.     Comments: Abdomen is soft without guarding  Musculoskeletal:     Cervical back: Neck supple.  Skin:    General: Skin is warm and dry.  Neurological:     Mental Status: She is alert and oriented to person, place, and time.  Psychiatric:        Mood and Affect: Mood normal.        Behavior: Behavior normal.        Thought Content: Thought content normal.        Judgment: Judgment normal.      BP 118/63 (BP Location: Right Arm, Patient Position: Sitting, Cuff Size: Normal)   Pulse 81   Temp 98.7 F (37.1 C) (Oral)   Resp 16   Ht 5\' 4"  (1.626 m)   Wt 193 lb (87.5 kg)   SpO2 100%   BMI 33.13 kg/m  Wt Readings from Last 3 Encounters:  07/19/23 193 lb (87.5 kg)  04/22/23 202 lb (91.6 kg)  03/21/23 197 lb 6.4 oz (89.5 kg)       Assessment & Plan:   Problem List Items Addressed This Visit       Unprioritized   Constipation - Primary   Abdominal pain seems to worsen anytime she is constipated and resolve after a good BM. Recommend that she add senna one tab once daily HS to promote regularity. It can be added to her pill boxes to help her remember. She is advised to go to the ER if she develops worsening pain or is unable to keep down food/liquid.       I have discontinued Korina P. Witherspoon "Patricia"'s fluconazole. I am also having her start on senna. Additionally, I am having her maintain her colchicine, aspirin EC, nitroGLYCERIN, albuterol, Nebulizer/Tubing/Mouthpiece, Accu-Chek Guide Me, nystatin, clobetasol ointment, albuterol, Probiotic Daily, meclizine, dicyclomine, valACYclovir, metoprolol succinate, levothyroxine, potassium chloride SA, escitalopram,  atorvastatin, isosorbide mononitrate, ezetimibe, lisinopril, allopurinol, diazepam, famotidine, and sucralfate.  Meds ordered this encounter  Medications   senna (SENOKOT) 8.6 MG TABS tablet    Sig: Take 1 tablet (8.6 mg total) by mouth at bedtime.    Supervising Provider:   Randie Bustle A [[id number]]   DISCONTD: fluconazole (DIFLUCAN) 150 MG tablet    Sig: Take 1 tablet (150 mg total) by mouth once for 1 dose.    Dispense:  1 tablet    Refill:  0    Supervising Provider:   Randie Bustle A [[id number]]   Diflucan sent in error. Removed diflucan order to [facility].  Cancelled rx at [facility] pharmacy- nobody picked

## 2023-07-19 NOTE — Patient Instructions (Addendum)
 Please add senna in place of metamucil- one tab daily at bedtime. Go to the ER if you develop worsening abdominal pain or inability to keep down food and liquid.   Please keep your appointment with gastroenterology.

## 2023-07-20 ENCOUNTER — Other Ambulatory Visit: Payer: Self-pay

## 2023-07-20 ENCOUNTER — Other Ambulatory Visit (HOSPITAL_BASED_OUTPATIENT_CLINIC_OR_DEPARTMENT_OTHER): Payer: Self-pay

## 2023-07-20 ENCOUNTER — Ambulatory Visit: Admitting: Family

## 2023-07-21 ENCOUNTER — Telehealth: Payer: Self-pay

## 2023-07-21 NOTE — Telephone Encounter (Signed)
-----   Message from Nurse Joetta Mustache sent at 07/20/2023 11:18 AM EDT ----- Please attempt to reschedule.  Thank you!   Clarnce Crow BSN RN CCM [place]  [facility], [facility] RN Care Manager Direct Dial: [phone] Fax:[phone]

## 2023-07-26 ENCOUNTER — Other Ambulatory Visit: Payer: Self-pay

## 2023-07-28 NOTE — Progress Notes (Signed)
 Complex Care Management Note Care Guide Note  07/28/2023 Name: Veronica Wolf MRN: [id number] DOB: 1940-07-04   Complex Care Management Outreach Attempts: An unsuccessful outreach was attempted for an appointment today.  Follow Up Plan:  No further outreach attempts will be made at this time. We have been unable to contact the patient to offer or enroll patient in complex care management services.  Encounter Outcome:  No Answer  [facility]  [facility], [facility] Care Management Assistant Direct Dial: [phone]  Fax: [phone]

## 2023-08-01 ENCOUNTER — Telehealth: Payer: Self-pay

## 2023-08-01 NOTE — Telephone Encounter (Signed)
 Copied from CRM [id number]. Topic: Clinical - Medication Question >> Aug 01, 2023  3:00 PM Dyann Glaser G wrote: Reason for CRM: THE PATIENT WOULD LIKE THE DOCTOR OR HER NURSE TO CALL IN REGARDS TO HER MEDS STATED ALL OF HER MEDS [phone] OR [phone]

## 2023-08-02 NOTE — Telephone Encounter (Signed)
 Called patient a few times at both numbers but no answer.

## 2023-08-04 ENCOUNTER — Ambulatory Visit: Admitting: Gastroenterology

## 2023-08-04 ENCOUNTER — Other Ambulatory Visit: Payer: Self-pay

## 2023-08-04 ENCOUNTER — Telehealth: Payer: Self-pay

## 2023-08-04 NOTE — Telephone Encounter (Signed)
 Called back several times but no answer and unable to leave message.  Patient needs to call her pharmacy to give new address.

## 2023-08-04 NOTE — Progress Notes (Deleted)
 Chief Complaint: follow-up intermittent mid upper abdominal pain. Primary GI Doctor: Dr. Karene Oto  HPI: 82 y.o. female known to Dr. Karene Oto with a past medical history not limited to  COPD, CKD, CAD with cardiac cath 2019, depression, HTN, HLD, allergic rhinitis, anxiety, GERD and chronic intermittent epigastric pain.  Patient last seen in the GI office by Maureen Sour, NP on 02/24/2023 for intermittent mid upper abdominal pain.     GI History:  Patient was last seen here January 2022 , at that time for evaluation of GERD and epigastric pain. EGD remarkable for esophageal stenosis s/p Savary dilation. Remainder of exam was normal.    In Jan 2023 she had a CTAP with contrast for evaluation of sepsis - no acute findings   In Aug 2023 she had a non-contrast CTAP - no acute findings.   Patient continued on sucralfate  1 g twice daily, reduced dose due to complaints of constipation.  Patient continued on Pepcid  twice daily.  Patient instructed to take dicyclomine  twice daily.  CBC, LFT, lipase labs- all baseline. Lipase normal.   Interval History    Patient admits/denies GERD Patient admits/denies dysphagia Patient admits/denies nausea, vomiting, or weight loss  Patient admits/denies altered bowel habits Patient admits/denies abdominal pain Patient admits/denies rectal bleeding   Denies/Admits alcohol Denies/Admits smoking Denies/Admits NSAID use. Denies/Admits they are on blood thinners.  Patients last colonoscopy Patients last EGD  Patient's family history includes    GI History / Pertinent GI Studies    **All endoscopic studies Denora Wysocki not be included here     Jan 2022 EGD for epigastric pain and ? GERD - Severe stenosis at the level of the UES/cricopharyngeus. Query presence of Zenker's diverticulum vs severe web. This was not traversable. - No specimens collected.    Josalynn Johndrow 2022 repeat EGD for evaluation  --- Benign-appearing esophageal stenosis 15 cm from incisors.  Dilated with a 12 mm Savary dilator with appropriate mucosal rent, consistent with successful dilation. The stricture was then further fractured using cold forceps. - 2 cm hiatal hernia. - Gastroesophageal flap valve classified as Hill Grade III (minimal fold, loose to endoscope, hiatal hernia likely). - Normal stomach. Biopsied. - Normal examined duodenum.    Wt Readings from Last 3 Encounters:  07/19/23 193 lb (87.5 kg)  04/22/23 202 lb (91.6 kg)  03/21/23 197 lb 6.4 oz (89.5 kg)      Past Medical History:  Diagnosis Date   Allergic rhinitis    Allergy    seasonal allergies   Anxiety    CKD (chronic kidney disease) 04/04/2017   COPD (chronic obstructive pulmonary disease) (HCC)    uses inhaler   Depression    Fatty liver    GERD (gastroesophageal reflux disease)    Gout    hx of   Hyperlipidemia    on meds   Hypertension    on meds   Myocardial infarction [facility])    Peptic ulcer 01/04/2003   Vertigo     Past Surgical History:  Procedure Laterality Date   ABDOMINAL HYSTERECTOMY     APPENDECTOMY     BIOPSY  08/13/2020   Procedure: BIOPSY;  Surgeon: Annis Kinder, DO;  Location: [facility];  Service: Gastroenterology;;   CORONARY PRESSURE/FFR STUDY N/A 01/27/2022   Procedure: INTRAVASCULAR PRESSURE WIRE/FFR STUDY;  Surgeon: Kyra Phy, MD;  Location: [facility] INVASIVE CV LAB;  Service: Cardiovascular;  Laterality: N/A;   ESOPHAGOGASTRODUODENOSCOPY (EGD) WITH PROPOFOL  N/A 08/13/2020   Procedure: ESOPHAGOGASTRODUODENOSCOPY (EGD) WITH PROPOFOL ;  Surgeon: Karene Oto,  Laquetta Plank, DO;  Location: [facility];  Service: Gastroenterology;  Laterality: N/A;   LEFT HEART CATH AND CORONARY ANGIOGRAPHY N/A 06/13/2017   Procedure: LEFT HEART CATH AND CORONARY ANGIOGRAPHY;  Surgeon: Arleen Lacer, MD;  Location: [facility] INVASIVE CV LAB;  Service: Cardiovascular;  Laterality: N/A;   LEFT HEART CATH AND CORONARY ANGIOGRAPHY N/A 01/27/2022   Procedure: LEFT HEART CATH AND CORONARY  ANGIOGRAPHY;  Surgeon: Kyra Phy, MD;  Location: [facility] INVASIVE CV LAB;  Service: Cardiovascular;  Laterality: N/A;   RIGHT/LEFT HEART CATH AND CORONARY ANGIOGRAPHY N/A 09/16/2017   Procedure: RIGHT/LEFT HEART CATH AND CORONARY ANGIOGRAPHY;  Surgeon: Swaziland, Peter M, MD;  Location: [facility] INVASIVE CV LAB;  Service: Cardiovascular;  Laterality: N/A;   SAVORY DILATION N/A 08/13/2020   Procedure: SAVORY DILATION;  Surgeon: Annis Kinder, DO;  Location: [facility];  Service: Gastroenterology;  Laterality: N/A;   ULTRASOUND GUIDANCE FOR VASCULAR ACCESS  09/16/2017   Procedure: Ultrasound Guidance For Vascular Access;  Surgeon: Swaziland, Peter M, MD;  Location: [facility] INVASIVE CV LAB;  Service: Cardiovascular;;   WISDOM TOOTH EXTRACTION      Current Outpatient Medications  Medication Sig Dispense Refill   albuterol  (PROVENTIL ) (2.5 MG/3ML) 0.083% nebulizer solution USE 1 VIAL IN NEBULIZER EVERY 6 HOURS AS NEEDED FOR WHEEZING AND FOR SHORTNESS OF BREATH 150 mL 0   albuterol  (VENTOLIN  HFA) 108 (90 Base) MCG/ACT inhaler Inhale 2 puffs into the lungs every 6 (six) hours as needed for wheezing or shortness of breath. 6.7 g 2   allopurinol  (ZYLOPRIM ) 100 MG tablet Take 2 tablets (200 mg total) by mouth daily. 180 tablet 0   aspirin  81 MG EC tablet Take 1 tablet (81 mg total) by mouth daily. Swallow whole. 30 tablet 12   atorvastatin  (LIPITOR ) 80 MG tablet Take 1 tablet (80 mg total) by mouth daily at 6 PM 90 tablet 1   Blood Glucose Monitoring Suppl (ACCU-CHEK GUIDE ME) w/Device KIT Use to test blood sugar in the morning, at noon, and at bedtime. 1 kit 0   clobetasol  ointment (TEMOVATE ) 0.05 % Apply 1 Application topically 2 (two) times daily. For up to 2 weeks. 30 g 0   colchicine  0.6 MG tablet Take 0.6 mg by mouth daily as needed (gout).     diazepam  (VALIUM ) 10 MG tablet Take 1 tablet (10 mg total) by mouth every 12 (twelve) hours as needed for anxiety. 60 tablet 0   dicyclomine  (BENTYL ) 10 MG capsule Take  1 capsule (10 mg total) by mouth 2 (two) times daily. 60 capsule 5   escitalopram  (LEXAPRO ) 20 MG tablet Take 1 tablet (20 mg total) by mouth daily with lunch 90 tablet 1   ezetimibe  (ZETIA ) 10 MG tablet Take 1 tablet (10 mg total) by mouth daily with lunch. 90 tablet 0   famotidine  (PEPCID ) 20 MG tablet Take 1 tablet (20 mg total) by mouth in the morning and at bedtime. 180 tablet 0   isosorbide  mononitrate (IMDUR ) 60 MG 24 hr tablet Take 1 tablet (60 mg total) by mouth daily at 6 PM. 90 tablet 0   levothyroxine  (SYNTHROID ) 25 MCG tablet Take 1 tablet (25 mcg total) by mouth before breakfast. 90 tablet 1   lisinopril  (ZESTRIL ) 2.5 MG tablet Take 1 tablet (2.5 mg total) by mouth daily with lunch. 90 tablet 0   meclizine  (ANTIVERT ) 25 MG tablet Take 1 tablet (25 mg total) by mouth 3 (three) times daily as needed for dizziness. 90 tablet 0  metoprolol  succinate (TOPROL -XL) 25 MG 24 hr tablet Take 0.5 tablets (12.5 mg total) by mouth every evening. 45 tablet 1   nitroGLYCERIN  (NITROSTAT ) 0.4 MG SL tablet Place 1 tablet (0.4 mg total) under the tongue every 5 (five) minutes as needed for chest pain. 45 tablet 3   nystatin  (MYCOSTATIN /NYSTOP ) powder Apply 1 Application topically to the affected area(s) 2 (two) times daily. 60 g 4   potassium chloride  SA (KLOR-CON  M) 20 MEQ tablet Take 1 tablet (20 mEq total) by mouth daily at 12 noon. 90 tablet 1   Probiotic Product (PROBIOTIC DAILY) CAPS Take 1 tablet by mouth daily. 90 capsule 0   Respiratory Therapy Supplies (NEBULIZER/TUBING/MOUTHPIECE) KIT Use as directed 1 kit 0   senna (SENOKOT) 8.6 MG TABS tablet Take 1 tablet (8.6 mg total) by mouth at bedtime.     sucralfate  (CARAFATE ) 1 g tablet Take 1 tablet (1 g total) by mouth 4 (four) times daily -  with meals and at bedtime. Take one hour prior to morning medications. 120 tablet 1   valACYclovir  (VALTREX ) 500 MG tablet Take 1 tablet (500 mg total) by mouth every other day. 45 tablet 1   No current  facility-administered medications for this visit.    Allergies as of 08/04/2023 - Review Complete 07/19/2023  Allergen Reaction Noted   Ibuprofen Other (See Comments) 04/05/2017    Family History  Problem Relation Age of Onset   Breast cancer Mother    Stroke Father    Stomach cancer Neg Hx    Colon cancer Neg Hx    Pancreatic cancer Neg Hx    Esophageal cancer Neg Hx    Colon polyps Neg Hx    Rectal cancer Neg Hx     Review of Systems:    Constitutional: No weight loss, fever, chills, weakness or fatigue HEENT: Eyes: No change in vision               Ears, Nose, Throat:  No change in hearing or congestion Skin: No rash or itching Cardiovascular: No chest pain, chest pressure or palpitations   Respiratory: No SOB or cough Gastrointestinal: See HPI and otherwise negative Genitourinary: No dysuria or change in urinary frequency Neurological: No headache, dizziness or syncope Musculoskeletal: No new muscle or joint pain Hematologic: No bleeding or bruising Psychiatric: No history of depression or anxiety    Physical Exam:  Vital signs: There were no vitals taken for this visit.  Constitutional:   Pleasant *** female appears to be in NAD, Well developed, Well nourished, alert and cooperative Head:  Normocephalic and atraumatic. Eyes:   PEERL, EOMI. No icterus. Conjunctiva pink. Ears:  Normal auditory acuity. Neck:  Supple Throat: Oral cavity and pharynx without inflammation, swelling or lesion.  Respiratory: Respirations even and unlabored. Lungs clear to auscultation bilaterally.   No wheezes, crackles, or rhonchi.  Cardiovascular: Normal S1, S2. Regular rate and rhythm. No peripheral edema, cyanosis or pallor.  Gastrointestinal:  Soft, nondistended, nontender. No rebound or guarding. Normal bowel sounds. No appreciable masses or hepatomegaly. Rectal:  Not performed.  Anoscopy: Msk:  Symmetrical without gross deformities. Without edema, no deformity or joint  abnormality.  Neurologic:  Alert and  oriented x4;  grossly normal neurologically.  Skin:   Dry and intact without significant lesions or rashes. Psychiatric: Oriented to person, place and time. Demonstrates good judgement and reason without abnormal affect or behaviors.  RELEVANT LABS AND IMAGING: CBC    Latest Ref Rng & Units 02/24/2023  3:40 PM 09/08/2022    9:53 AM 03/19/2022    4:25 PM  CBC  WBC 4.0 - 10.5 K/uL 11.1  9.6  12.0   Hemoglobin 12.0 - 15.0 g/dL 16.1  09.6  04.5   Hematocrit 36.0 - 46.0 % 39.8  38.8  39.3   Platelets 150.0 - 400.0 K/uL 203.0  218.0  236      CMP     Latest Ref Rng & Units 04/22/2023   12:32 PM 02/24/2023    3:40 PM 11/17/2022   10:29 AM  CMP  Glucose 70 - 99 mg/dL 78  409  97   BUN 6 - 23 mg/dL 11  13  13    Creatinine 0.40 - 1.20 mg/dL 8.11  9.14  7.82   Sodium 135 - 145 mEq/L 142  140  139   Potassium 3.5 - 5.1 mEq/L 4.4  4.1  4.2   Chloride 96 - 112 mEq/L 106  105  105   CO2 19 - 32 mEq/L 29  28  26    Calcium  8.4 - 10.5 mg/dL 8.9  9.4  8.7   Total Protein 6.0 - 8.3 g/dL 7.1  7.2    Total Bilirubin 0.2 - 1.2 mg/dL 0.3  0.3    Alkaline Phos 39 - 117 U/L 72  79    AST 0 - 37 U/L 13  14    ALT 0 - 35 U/L 7  7       Lab Results  Component Value Date   TSH 4.34 04/22/2023     Assessment: 1. ***  Plan: 1. ***   Thank you for the courtesy of this consult. Please call me with any questions or concerns.   Avangeline Stockburger, FNP-C [facility] 08/04/2023, 7:36 AM  Cc: Dorrene Gaucher, NP

## 2023-08-08 ENCOUNTER — Other Ambulatory Visit (HOSPITAL_COMMUNITY): Payer: Self-pay

## 2023-08-08 ENCOUNTER — Other Ambulatory Visit: Payer: Self-pay | Admitting: Family

## 2023-08-08 ENCOUNTER — Other Ambulatory Visit: Payer: Self-pay

## 2023-08-08 ENCOUNTER — Ambulatory Visit (INDEPENDENT_AMBULATORY_CARE_PROVIDER_SITE_OTHER): Admitting: Pharmacist

## 2023-08-08 DIAGNOSIS — Z79899 Other long term (current) drug therapy: Secondary | ICD-10-CM

## 2023-08-08 NOTE — Progress Notes (Signed)
 05/18/2023 Name: Veronica Wolf MRN: [id number] DOB: May 24, 1940 Chief Complaint  Patient presents with   Medication Management    Veronica Wolf is a 83 y.o. year old female who was referred for medication management by their primary care provider, Veronica Gaucher, Wolf.       Subjective: Called today for follow up with patient regarding medication management. Initially I was not able to reach patient at her usual number but I did reach her thru her daughter, Veronica Wolf's number. Veronica Wolf will be staying with her daughter for a few months while Veronica Wolf' home is being "[employer] states she was trying to get in touch with the pharmacy but it appears she had been trying to call [facility] which is no longer open.   Since patient has limited transportation   Medication Access/Adherence  Current Pharmacy:  [facility] - [facility] [address] [place] [address] Phone: [phone] Fax: [phone]  Patient reports affordability concerns with their medications: No  Patient reports access/transportation concerns to their pharmacy:  medications are delivered to her - if needed her daughter can pick up medications Patient reports adherence concerns with their medications:  Yes  - reports she is out of diazepam .    Current adherence strategy: uses adherence packaging. Last adherence packaging filled 07/13/2023 and started around 07/24/2023. She is due to have refills for most medications 08/12/2023 per [facility].   Packaging should have this schedule:  Morning 8am: allopurinol  100mg   - take 2 tablets, dicyclomine  10mg , famotidine  20mg , levothyroxine  25 mcg, sucralfate  1 gram Lunch- 12pm / noon:  potassium, ezetimibe , lisinopril , escitalopram , sucralfate  1 gram Supper 6pm - atorvastatin  80mg , isosorbide  Er 30mg , sucralfate  1 gram Night / 10pm: famotidine , dicyclomine , sucralfate  1 gram.   She takes the following medications which  are not included in her packaged medication:  aspirin  81mg  daily at night, metoprolol  ER 25mg  0.5 tablet at night, diazepam  as needed and valacyclovir  every other night.  Patient keeps these medications by her bed to take because they cannot be included in packaging   Objective:  Lab Results  Component Value Date   HGBA1C 6.8 (H) 04/22/2023    Lab Results  Component Value Date   CREATININE 1.49 (H) 04/22/2023   BUN 11 04/22/2023   NA 142 04/22/2023   K 4.4 04/22/2023   CL 106 04/22/2023   CO2 29 04/22/2023    Lab Results  Component Value Date   CHOL 242 (H) 04/22/2023   HDL 45.50 04/22/2023   LDLCALC 153 (H) 04/22/2023   TRIG 217.0 (H) 04/22/2023   CHOLHDL 5 04/22/2023    Medications Reviewed Today     Reviewed by Veronica Wolf (Pharmacist) on 08/08/23 at 1631  Med List Status: <None>   Medication Order Taking? Sig Documenting Provider Last Dose Status Informant  albuterol  (PROVENTIL ) (2.5 MG/3ML) 0.083% nebulizer solution [id number]  USE 1 VIAL IN NEBULIZER EVERY 6 HOURS AS NEEDED FOR WHEEZING AND FOR SHORTNESS OF Veronica Wolf, Veronica Wolf  Active   albuterol  (VENTOLIN  HFA) 108 (90 Base) MCG/ACT inhaler [id number] Yes Inhale 2 puffs into the lungs every 6 (six) hours as needed for wheezing or shortness of breath. Veronica Gaucher, Wolf Taking Active   allopurinol  (ZYLOPRIM ) 100 MG tablet [id number] Yes Take 2 tablets (200 mg total) by mouth daily. Veronica Gaucher, Wolf Taking Active   aspirin  81 MG EC tablet [id number] Yes Take 1 tablet (81 mg total) by mouth daily. Swallow  whole. Veronica Wolf Taking Active            Med Note Veronica Wolf   Wed Oct 07, 2021 11:19 AM)    atorvastatin  (LIPITOR ) 80 MG tablet [id number] Yes Take 1 tablet (80 mg total) by mouth daily at 6 PM Veronica Gaucher, Wolf Taking Active   Blood Glucose Monitoring Suppl (ACCU-CHEK GUIDE ME) w/Device KIT [id number]  Use to test blood sugar in the morning, at noon, and at bedtime.  O'Sullivan, Veronica Wolf  Active   clobetasol  ointment (TEMOVATE ) 0.05 % [id number]  Apply 1 Application topically 2 (two) times daily. For up to 2 weeks. Veronica Gaucher, Wolf  Active   colchicine  0.6 MG tablet [id number] Yes Take 0.6 mg by mouth daily as needed (gout). [provider] Taking Active   diazepam  (VALIUM ) 10 MG tablet [id number] Yes Take 1 tablet (10 mg total) by mouth every 12 (twelve) hours as needed for anxiety. Veronica Gaucher, Wolf Taking Active   dicyclomine  (BENTYL ) 10 MG capsule [id number] Yes Take 1 capsule (10 mg total) by mouth 2 (two) times daily. Veronica Bellows, Wolf Taking Active   escitalopram  (LEXAPRO ) 20 MG tablet [id number] Yes Take 1 tablet (20 mg total) by mouth daily with lunch Veronica Gaucher, Wolf Taking Active   ezetimibe  (ZETIA ) 10 MG tablet [id number] Yes Take 1 tablet (10 mg total) by mouth daily with lunch. Veronica Gaucher, Wolf Taking Active   famotidine  (PEPCID ) 20 MG tablet [id number] Yes Take 1 tablet (20 mg total) by mouth in the morning and at bedtime. Veronica Gaucher, Wolf Taking Active   isosorbide  mononitrate (IMDUR ) 60 MG 24 hr tablet [id number] Yes Take 1 tablet (60 mg total) by mouth daily at 6 PM. Veronica Gaucher, Wolf Taking Active   levothyroxine  (SYNTHROID ) 25 MCG tablet [id number] Yes Take 1 tablet (25 mcg total) by mouth before breakfast. Veronica Balk, MD Taking Active   lisinopril  (ZESTRIL ) 2.5 MG tablet [id number] Yes Take 1 tablet (2.5 mg total) by mouth daily with lunch. Veronica Gaucher, Wolf Taking Active   meclizine  (ANTIVERT ) 25 MG tablet [id number] Yes Take 1 tablet (25 mg total) by mouth 3 (three) times daily as needed for dizziness. Veronica Gaucher, Wolf Taking Active   metoprolol  succinate (TOPROL -XL) 25 MG 24 hr tablet [id number] Yes Take 0.5 tablets (12.5 mg total) by mouth every evening. Veronica Gaucher, Wolf Taking Active            Med Note Veronica Wolf, [place] Wolf   Thu May 19, 2023 12:52 PM)    nitroGLYCERIN   (NITROSTAT ) 0.4 MG SL tablet [id number] Yes Place 1 tablet (0.4 mg total) under the tongue every 5 (five) minutes as needed for chest pain. Veronica Wolf Taking Active   nystatin  (MYCOSTATIN /NYSTOP ) powder [id number] Yes Apply 1 Application topically to the affected area(s) 2 (two) times daily. Veronica Gaucher, Wolf Taking Active   potassium chloride  SA (KLOR-CON  M) 20 MEQ tablet [id number] Yes Take 1 tablet (20 mEq total) by mouth daily at 12 noon. Veronica Gaucher, Wolf Taking Active   Probiotic Product (PROBIOTIC DAILY) CAPS [id number] Yes Take 1 tablet by mouth daily. Everlina Hock, Wolf Taking Active   Respiratory Therapy Supplies (NEBULIZER/TUBING/MOUTHPIECE) Suzanne Erps [id number] Yes Use as directed Veronica Gaucher, Wolf Taking Active   senna (SENOKOT) 8.6 MG TABS tablet [id number] Yes Take 1 tablet (8.6 mg total) by mouth at bedtime. Veronica Gaucher, Wolf Taking Active   sucralfate  (CARAFATE ) 1 g tablet [id number] Yes Take 1 tablet (1 g total) by  mouth 4 (four) times daily -  with meals and at bedtime. Take one hour prior to morning medications. Veronica Gaucher, Wolf Taking Active   valACYclovir  (VALTREX ) 500 MG tablet [id number] Yes Take 1 tablet (500 mg total) by mouth every other day. Veronica Gaucher, Wolf Taking Active               Assessment/Plan:   Medication Management: - Reviewed  medications and packaging with patient's daughter.  - Coordinated with [facility] - they sent electronic request for diazepam  refill to PCP. Daughter to call pharmacy to updated patient's temporary address to her while Mrsm Sholtis is living with her daughter.  She actually appear to be taking medications as prescribed on most days.  - Confirmed packaging will be filled 5/9 and then mailed out to patient.   Follow Up Plan: 2 to 4 weeks.   Veronica Wolf, PharmD Clinical Pharmacist [facility]

## 2023-08-09 ENCOUNTER — Other Ambulatory Visit: Payer: Self-pay

## 2023-08-09 ENCOUNTER — Other Ambulatory Visit (HOSPITAL_COMMUNITY): Payer: Self-pay

## 2023-08-10 ENCOUNTER — Other Ambulatory Visit: Payer: Self-pay

## 2023-08-10 ENCOUNTER — Other Ambulatory Visit (HOSPITAL_COMMUNITY): Payer: Self-pay

## 2023-08-10 MED ORDER — DIAZEPAM 10 MG PO TABS
10.0000 mg | ORAL_TABLET | Freq: Two times a day (BID) | ORAL | 0 refills | Status: DC | PRN
  Filled 2023-08-10: qty 60, 30d supply, fill #0

## 2023-08-11 ENCOUNTER — Other Ambulatory Visit (HOSPITAL_COMMUNITY): Payer: Self-pay

## 2023-08-11 ENCOUNTER — Telehealth: Payer: Self-pay

## 2023-08-11 ENCOUNTER — Telehealth: Payer: Self-pay | Admitting: Emergency Medicine

## 2023-08-11 NOTE — Telephone Encounter (Signed)
 Called patient but no answer, left voice mail for patient to call back.

## 2023-08-11 NOTE — Telephone Encounter (Signed)
 Copied from CRM [id number]. Topic: General - Other >> Aug 11, 2023  9:06 AM Albertha Alosa wrote: Reason for CRM: Patient and her daughter Aleda Hurl call to speak with Np Dorrene Gaucher and her team regarding patient seeing specialists, would like a callback at    [phone]

## 2023-08-11 NOTE — Telephone Encounter (Signed)
 Patient called in to discuss chronic abdominal pain & new onset of chest pain. Unfortunately, I was unable to find out a lot of information from pt d/t significant SOB & wheezing on the phone. Requested to speak with daughter Aleda Hurl that was near by. Stated the breathing issue was new (in the last few days). Advised her we could keep OV as planned, but she needs to go to ED today to evaluate breathing. Aleda Hurl verbalized all understanding.

## 2023-08-11 NOTE — Telephone Encounter (Signed)
 Patient's daughter called at cell number listed in chart [phone]. She needed phone number to GI provider seen by patient last year, number provided for [facility]

## 2023-08-12 ENCOUNTER — Other Ambulatory Visit (HOSPITAL_COMMUNITY): Payer: Self-pay

## 2023-08-12 ENCOUNTER — Other Ambulatory Visit: Payer: Self-pay

## 2023-08-16 ENCOUNTER — Other Ambulatory Visit: Payer: Self-pay

## 2023-08-17 ENCOUNTER — Ambulatory Visit: Payer: Self-pay

## 2023-08-17 NOTE — Telephone Encounter (Signed)
 Chief Complaint: cough Symptoms: cough and congestion Frequency: x 2-3 weeks Pertinent Negatives: Patient denies sob Disposition: [] ED /[] Urgent Care (no appt availability in office) / [x] Appointment(In office/virtual)/ []  [facility] [facility]/ [] Home Care/ [] Refused Recommended Disposition /[] [facility]/ []  Follow-up with PCP Additional Notes: Daughter states that pt had been having some abd pain but has since moved into her chest. States that patient has a cough and wheezing. States cough x 2-3 weeks and dry cough. Denies fever. States that pain in chest when coughing over the last couple days. States headaches from coughing. States taking Robitussin and NyQuil.   Copied from CRM [id number]. Topic: Clinical - Red Word Triage >> Aug 17, 2023  2:12 PM Veronica Wolf wrote: [facility] that prompted transfer to Nurse Triage: Patient daughter called in stating patient is in a lot of pain Reason for Disposition  Wheezing is present  Protocols used: Cough - Acute Non-Productive-A-AH

## 2023-08-18 ENCOUNTER — Ambulatory Visit (INDEPENDENT_AMBULATORY_CARE_PROVIDER_SITE_OTHER): Admitting: Physician Assistant

## 2023-08-18 ENCOUNTER — Inpatient Hospital Stay (HOSPITAL_BASED_OUTPATIENT_CLINIC_OR_DEPARTMENT_OTHER)
Admission: EM | Admit: 2023-08-18 | Discharge: 2023-08-22 | DRG: 193 | Disposition: A | Discharge status: Home / Self Care | Attending: Family Medicine | Admitting: Family Medicine

## 2023-08-18 ENCOUNTER — Emergency Department (HOSPITAL_BASED_OUTPATIENT_CLINIC_OR_DEPARTMENT_OTHER)

## 2023-08-18 ENCOUNTER — Encounter (HOSPITAL_BASED_OUTPATIENT_CLINIC_OR_DEPARTMENT_OTHER): Payer: Self-pay | Admitting: Emergency Medicine

## 2023-08-18 ENCOUNTER — Encounter: Payer: Self-pay | Admitting: Physician Assistant

## 2023-08-18 ENCOUNTER — Inpatient Hospital Stay (HOSPITAL_COMMUNITY)

## 2023-08-18 ENCOUNTER — Ambulatory Visit: Admitting: Physician Assistant

## 2023-08-18 ENCOUNTER — Other Ambulatory Visit: Payer: Self-pay

## 2023-08-18 VITALS — BP 127/75 | HR 84 | Ht 64.0 in | Wt 196.2 lb

## 2023-08-18 DIAGNOSIS — J189 Pneumonia, unspecified organism: Principal | ICD-10-CM | POA: Diagnosis present

## 2023-08-18 DIAGNOSIS — E785 Hyperlipidemia, unspecified: Secondary | ICD-10-CM | POA: Diagnosis present

## 2023-08-18 DIAGNOSIS — F419 Anxiety disorder, unspecified: Secondary | ICD-10-CM | POA: Diagnosis present

## 2023-08-18 DIAGNOSIS — I771 Stricture of artery: Secondary | ICD-10-CM | POA: Diagnosis not present

## 2023-08-18 DIAGNOSIS — I130 Hypertensive heart and chronic kidney disease with heart failure and stage 1 through stage 4 chronic kidney disease, or unspecified chronic kidney disease: Secondary | ICD-10-CM | POA: Diagnosis not present

## 2023-08-18 DIAGNOSIS — N1832 Chronic kidney disease, stage 3b: Secondary | ICD-10-CM | POA: Diagnosis not present

## 2023-08-18 DIAGNOSIS — R918 Other nonspecific abnormal finding of lung field: Secondary | ICD-10-CM | POA: Diagnosis not present

## 2023-08-18 DIAGNOSIS — K760 Fatty (change of) liver, not elsewhere classified: Secondary | ICD-10-CM | POA: Diagnosis not present

## 2023-08-18 DIAGNOSIS — K5900 Constipation, unspecified: Secondary | ICD-10-CM | POA: Diagnosis present

## 2023-08-18 DIAGNOSIS — I252 Old myocardial infarction: Secondary | ICD-10-CM

## 2023-08-18 DIAGNOSIS — R7989 Other specified abnormal findings of blood chemistry: Secondary | ICD-10-CM | POA: Diagnosis not present

## 2023-08-18 DIAGNOSIS — I5030 Unspecified diastolic (congestive) heart failure: Secondary | ICD-10-CM | POA: Diagnosis not present

## 2023-08-18 DIAGNOSIS — Z7982 Long term (current) use of aspirin: Secondary | ICD-10-CM

## 2023-08-18 DIAGNOSIS — E1165 Type 2 diabetes mellitus with hyperglycemia: Secondary | ICD-10-CM | POA: Diagnosis not present

## 2023-08-18 DIAGNOSIS — Z823 Family history of stroke: Secondary | ICD-10-CM | POA: Diagnosis not present

## 2023-08-18 DIAGNOSIS — Z886 Allergy status to analgesic agent status: Secondary | ICD-10-CM

## 2023-08-18 DIAGNOSIS — R0603 Acute respiratory distress: Secondary | ICD-10-CM | POA: Diagnosis not present

## 2023-08-18 DIAGNOSIS — I5033 Acute on chronic diastolic (congestive) heart failure: Secondary | ICD-10-CM | POA: Diagnosis present

## 2023-08-18 DIAGNOSIS — Z79899 Other long term (current) drug therapy: Secondary | ICD-10-CM | POA: Diagnosis not present

## 2023-08-18 DIAGNOSIS — R051 Acute cough: Secondary | ICD-10-CM

## 2023-08-18 DIAGNOSIS — Z79890 Hormone replacement therapy (postmenopausal): Secondary | ICD-10-CM

## 2023-08-18 DIAGNOSIS — K219 Gastro-esophageal reflux disease without esophagitis: Secondary | ICD-10-CM | POA: Diagnosis present

## 2023-08-18 DIAGNOSIS — E669 Obesity, unspecified: Secondary | ICD-10-CM | POA: Diagnosis present

## 2023-08-18 DIAGNOSIS — J9601 Acute respiratory failure with hypoxia: Secondary | ICD-10-CM | POA: Diagnosis present

## 2023-08-18 DIAGNOSIS — R0989 Other specified symptoms and signs involving the circulatory and respiratory systems: Secondary | ICD-10-CM | POA: Diagnosis not present

## 2023-08-18 DIAGNOSIS — E039 Hypothyroidism, unspecified: Secondary | ICD-10-CM | POA: Diagnosis present

## 2023-08-18 DIAGNOSIS — M109 Gout, unspecified: Secondary | ICD-10-CM | POA: Diagnosis present

## 2023-08-18 DIAGNOSIS — Z6833 Body mass index (BMI) 33.0-33.9, adult: Secondary | ICD-10-CM

## 2023-08-18 DIAGNOSIS — E876 Hypokalemia: Secondary | ICD-10-CM | POA: Diagnosis present

## 2023-08-18 DIAGNOSIS — Z87891 Personal history of nicotine dependence: Secondary | ICD-10-CM | POA: Diagnosis not present

## 2023-08-18 DIAGNOSIS — Z803 Family history of malignant neoplasm of breast: Secondary | ICD-10-CM | POA: Diagnosis not present

## 2023-08-18 DIAGNOSIS — R0602 Shortness of breath: Secondary | ICD-10-CM | POA: Diagnosis not present

## 2023-08-18 DIAGNOSIS — F32A Depression, unspecified: Secondary | ICD-10-CM | POA: Diagnosis present

## 2023-08-18 DIAGNOSIS — Z90710 Acquired absence of both cervix and uterus: Secondary | ICD-10-CM

## 2023-08-18 DIAGNOSIS — J441 Chronic obstructive pulmonary disease with (acute) exacerbation: Secondary | ICD-10-CM | POA: Diagnosis not present

## 2023-08-18 DIAGNOSIS — Z1152 Encounter for screening for COVID-19: Secondary | ICD-10-CM | POA: Diagnosis not present

## 2023-08-18 DIAGNOSIS — I700 Atherosclerosis of aorta: Secondary | ICD-10-CM | POA: Diagnosis not present

## 2023-08-18 DIAGNOSIS — J440 Chronic obstructive pulmonary disease with acute lower respiratory infection: Secondary | ICD-10-CM | POA: Diagnosis not present

## 2023-08-18 DIAGNOSIS — E1122 Type 2 diabetes mellitus with diabetic chronic kidney disease: Secondary | ICD-10-CM | POA: Diagnosis not present

## 2023-08-18 DIAGNOSIS — J439 Emphysema, unspecified: Secondary | ICD-10-CM | POA: Diagnosis not present

## 2023-08-18 LAB — COMPREHENSIVE METABOLIC PANEL WITH GFR
ALT: 9 U/L (ref 0–44)
AST: 16 U/L (ref 15–41)
Albumin: 3.3 g/dL — ABNORMAL LOW (ref 3.5–5.0)
Alkaline Phosphatase: 72 U/L (ref 38–126)
Anion gap: 12 (ref 5–15)
BUN: 12 mg/dL (ref 8–23)
CO2: 26 mmol/L (ref 22–32)
Calcium: 8.6 mg/dL — ABNORMAL LOW (ref 8.9–10.3)
Chloride: 109 mmol/L (ref 98–111)
Creatinine, Ser: 1.2 mg/dL — ABNORMAL HIGH (ref 0.44–1.00)
GFR, Estimated: 44 mL/min — ABNORMAL LOW (ref >60)
Glucose, Bld: 108 mg/dL — ABNORMAL HIGH (ref 70–99)
Potassium: 3.4 mmol/L — ABNORMAL LOW (ref 3.5–5.1)
Sodium: 146 mmol/L — ABNORMAL HIGH (ref 135–145)
Total Bilirubin: <0.2 mg/dL (ref 0.0–1.2)
Total Protein: 6.9 g/dL (ref 6.5–8.1)

## 2023-08-18 LAB — CBC
HCT: 34.8 % — ABNORMAL LOW (ref 36.0–46.0)
Hemoglobin: 10.8 g/dL — ABNORMAL LOW (ref 12.0–15.0)
MCH: 28.7 pg (ref 26.0–34.0)
MCHC: 31 g/dL (ref 30.0–36.0)
MCV: 92.6 fL (ref 80.0–100.0)
Platelets: 285 10*3/uL (ref 150–400)
RBC: 3.76 MIL/uL — ABNORMAL LOW (ref 3.87–5.11)
RDW: 16.8 % — ABNORMAL HIGH (ref 11.5–15.5)
WBC: 13.7 10*3/uL — ABNORMAL HIGH (ref 4.0–10.5)
nRBC: 0.4 % — ABNORMAL HIGH (ref 0.0–0.2)

## 2023-08-18 LAB — LIPASE, BLOOD: Lipase: 17 U/L (ref 11–51)

## 2023-08-18 LAB — PRO BRAIN NATRIURETIC PEPTIDE: Pro Brain Natriuretic Peptide: 3241 pg/mL — ABNORMAL HIGH (ref <300.0)

## 2023-08-18 LAB — D-DIMER, QUANTITATIVE: D-Dimer, Quant: 1.77 ug{FEU}/mL — ABNORMAL HIGH (ref 0.00–0.50)

## 2023-08-18 LAB — MAGNESIUM: Magnesium: 1.7 mg/dL (ref 1.7–2.4)

## 2023-08-18 LAB — TROPONIN T, HIGH SENSITIVITY: Troponin T High Sensitivity: <15 ng/L (ref <19)

## 2023-08-18 LAB — RESP PANEL BY RT-PCR (RSV, FLU A&B, COVID)  RVPGX2
Influenza A by PCR: NEGATIVE
Influenza B by PCR: NEGATIVE
Resp Syncytial Virus by PCR: NEGATIVE
SARS Coronavirus 2 by RT PCR: NEGATIVE

## 2023-08-18 LAB — GLUCOSE, CAPILLARY: Glucose-Capillary: 99 mg/dL (ref 70–99)

## 2023-08-18 LAB — CBG MONITORING, ED
Glucose-Capillary: 92 mg/dL (ref 70–99)
Glucose-Capillary: 97 mg/dL (ref 70–99)

## 2023-08-18 LAB — PROCALCITONIN: Procalcitonin: <0.1 ng/mL

## 2023-08-18 MED ORDER — IOHEXOL 350 MG/ML SOLN
75.0000 mL | Freq: Once | INTRAVENOUS | Status: AC | PRN
Start: 2023-08-18 — End: 2023-08-18
  Administered 2023-08-18: 75 mL via INTRAVENOUS

## 2023-08-18 MED ORDER — INSULIN ASPART 100 UNIT/ML IJ SOLN
0.0000 [IU] | Freq: Three times a day (TID) | INTRAMUSCULAR | Status: DC
  Administered 2023-08-20: 1 [IU] via SUBCUTANEOUS

## 2023-08-18 MED ORDER — FUROSEMIDE 10 MG/ML IJ SOLN
20.0000 mg | Freq: Once | INTRAMUSCULAR | Status: AC
  Administered 2023-08-18: 20 mg via INTRAVENOUS
  Filled 2023-08-18: qty 2

## 2023-08-18 MED ORDER — IPRATROPIUM-ALBUTEROL 0.5-2.5 (3) MG/3ML IN SOLN
RESPIRATORY_TRACT | Status: AC
  Administered 2023-08-18: 3 mL
  Filled 2023-08-18: qty 3

## 2023-08-18 MED ORDER — ALBUTEROL SULFATE (2.5 MG/3ML) 0.083% IN NEBU
2.5000 mg | INHALATION_SOLUTION | Freq: Four times a day (QID) | RESPIRATORY_TRACT | Status: DC | PRN
  Filled 2023-08-18: qty 3

## 2023-08-18 MED ORDER — ASPIRIN 81 MG PO TBEC
81.0000 mg | DELAYED_RELEASE_TABLET | Freq: Every day | ORAL | Status: DC
  Administered 2023-08-18 – 2023-08-22 (×5): 81 mg via ORAL
  Filled 2023-08-18 (×5): qty 1

## 2023-08-18 MED ORDER — NITROGLYCERIN 0.4 MG SL SUBL: 0.4000 mg | SUBLINGUAL_TABLET | SUBLINGUAL | Status: DC | PRN

## 2023-08-18 MED ORDER — ESCITALOPRAM OXALATE 10 MG PO TABS
20.0000 mg | ORAL_TABLET | Freq: Every day | ORAL | Status: DC
  Administered 2023-08-19 – 2023-08-22 (×4): 20 mg via ORAL
  Filled 2023-08-18 (×4): qty 2

## 2023-08-18 MED ORDER — ALBUTEROL SULFATE (2.5 MG/3ML) 0.083% IN NEBU
INHALATION_SOLUTION | RESPIRATORY_TRACT | Status: AC
Start: 2023-08-18 — End: 2023-08-18
  Administered 2023-08-18: 2.5 mg
  Filled 2023-08-18: qty 3

## 2023-08-18 MED ORDER — ATORVASTATIN CALCIUM 80 MG PO TABS
80.0000 mg | ORAL_TABLET | Freq: Every day | ORAL | Status: DC
  Administered 2023-08-18 – 2023-08-21 (×4): 80 mg via ORAL
  Filled 2023-08-18 (×4): qty 1

## 2023-08-18 MED ORDER — ACETAMINOPHEN 500 MG PO TABS
1000.0000 mg | ORAL_TABLET | Freq: Once | ORAL | Status: AC
  Administered 2023-08-18: 1000 mg via ORAL
  Filled 2023-08-18: qty 2

## 2023-08-18 NOTE — ED Triage Notes (Signed)
 Shortness of breath and epigastric pain x 2 weeks , new cough x 3 weeks .  Was seen at PCP , hypoxic . Presents in wheelchair and on O2  2 L [place] . Hx COPD .   Also reports constipation , last BM 2 weeks ago .

## 2023-08-18 NOTE — Progress Notes (Signed)
 Established patient visit   Patient: Veronica Wolf   DOB: [birth year]   83 y.o. Female  MRN: [id number] Visit Date: 08/18/2023  Today's healthcare provider: Trenton Frock, PA-C   Cc. Wheezing, sob, cough  Subjective     Pt is accompanied by daughter today.   Reports cough, wheezing, x 2-3 weeks. Taking robitussion and nyquil otc with no relief.  Denies fevers. Pt reports acute SOB with wheezing at rest and coughing fits where she cannot catch her breath.   Medications: Outpatient Medications Prior to Visit  Medication Sig   albuterol  (PROVENTIL ) (2.5 MG/3ML) 0.083% nebulizer solution USE 1 VIAL IN NEBULIZER EVERY 6 HOURS AS NEEDED FOR WHEEZING AND FOR SHORTNESS OF BREATH   albuterol  (VENTOLIN  HFA) 108 (90 Base) MCG/ACT inhaler Inhale 2 puffs into the lungs every 6 (six) hours as needed for wheezing or shortness of breath.   allopurinol  (ZYLOPRIM ) 100 MG tablet Take 2 tablets (200 mg total) by mouth daily.   aspirin  81 MG EC tablet Take 1 tablet (81 mg total) by mouth daily. Swallow whole.   atorvastatin  (LIPITOR ) 80 MG tablet Take 1 tablet (80 mg total) by mouth daily at 6 PM   Blood Glucose Monitoring Suppl (ACCU-CHEK GUIDE ME) w/Device KIT Use to test blood sugar in the morning, at noon, and at bedtime.   clobetasol  ointment (TEMOVATE ) 0.05 % Apply 1 Application topically 2 (two) times daily. For up to 2 weeks.   colchicine  0.6 MG tablet Take 0.6 mg by mouth daily as needed (gout).   diazepam  (VALIUM ) 10 MG tablet Take 1 tablet (10 mg total) by mouth every 12 (twelve) hours as needed for anxiety.   dicyclomine  (BENTYL ) 10 MG capsule Take 1 capsule (10 mg total) by mouth 2 (two) times daily.   escitalopram  (LEXAPRO ) 20 MG tablet Take 1 tablet (20 mg total) by mouth daily with lunch   ezetimibe  (ZETIA ) 10 MG tablet Take 1 tablet (10 mg total) by mouth daily with lunch.   famotidine  (PEPCID ) 20 MG tablet Take 1 tablet (20 mg total) by mouth in the morning and at bedtime.    isosorbide  mononitrate (IMDUR ) 60 MG 24 hr tablet Take 1 tablet (60 mg total) by mouth daily at 6 PM.   levothyroxine  (SYNTHROID ) 25 MCG tablet Take 1 tablet (25 mcg total) by mouth before breakfast.   lisinopril  (ZESTRIL ) 2.5 MG tablet Take 1 tablet (2.5 mg total) by mouth daily with lunch.   meclizine  (ANTIVERT ) 25 MG tablet Take 1 tablet (25 mg total) by mouth 3 (three) times daily as needed for dizziness.   metoprolol  succinate (TOPROL -XL) 25 MG 24 hr tablet Take 0.5 tablets (12.5 mg total) by mouth every evening.   nitroGLYCERIN  (NITROSTAT ) 0.4 MG SL tablet Place 1 tablet (0.4 mg total) under the tongue every 5 (five) minutes as needed for chest pain.   nystatin  (MYCOSTATIN /NYSTOP ) powder Apply 1 Application topically to the affected area(s) 2 (two) times daily.   potassium chloride  SA (KLOR-CON  M) 20 MEQ tablet Take 1 tablet (20 mEq total) by mouth daily at 12 noon.   Probiotic Product (PROBIOTIC DAILY) CAPS Take 1 tablet by mouth daily.   Respiratory Therapy Supplies (NEBULIZER/TUBING/MOUTHPIECE) KIT Use as directed   senna (SENOKOT) 8.6 MG TABS tablet Take 1 tablet (8.6 mg total) by mouth at bedtime.   sucralfate  (CARAFATE ) 1 g tablet Take 1 tablet (1 g total) by mouth 4 (four) times daily -  with meals and at bedtime. Take one  hour prior to morning medications.   valACYclovir  (VALTREX ) 500 MG tablet Take 1 tablet (500 mg total) by mouth every other day.   No facility-administered medications prior to visit.    Review of Systems  Constitutional:  Positive for fatigue. Negative for fever.  Respiratory:  Positive for cough, shortness of breath and wheezing.   Cardiovascular:  Negative for chest pain and leg swelling.  Gastrointestinal:  Negative for abdominal pain.  Neurological:  Negative for dizziness and headaches.       Objective    BP 127/75   Pulse 84   Ht 5\' 4"  (1.626 m)   Wt 196 lb 3.2 oz (89 kg)   SpO2 95% Comment: 2L [place]  PF (!) 2 L/min   BMI 33.68 kg/m     Physical Exam Constitutional:      General: She is in acute distress.     Appearance: She is obese. She is ill-appearing.  HENT:     Head: Normocephalic.  Pulmonary:     Effort: Respiratory distress present.     Breath sounds: Wheezing and rhonchi present.  Neurological:     Mental Status: She is oriented to person, place, and time.     No results found for any visits on 08/18/23.  Assessment & Plan    Respiratory distress  Acute cough  Abnormal lung sounds   O2 down to 82% while ambulating, 89% -91% at rest. Wheezing audible at rest, lungs w/ scattered rhonchi and wheezing. Coughing spasms and long and pt is unable to speak during.  2L [place] placed, recommending ED for eval of acute respiratory failure w/ hypoxia, pneumonia.  Trenton Frock, PA-C  [facility] [phone] (phone) [phone] (fax)  [facility]

## 2023-08-18 NOTE — Progress Notes (Addendum)
 Admit request note from [facility].  <call>Call [facility] at [phone] re: Pt Shaqunda, Bedoya #[id number] / Room: [facility].    Vitals reviewed as below patient on 2 L nasal cannula O2 sats 93%: Vitals:   08/18/23 1040 08/18/23 1054  BP: (!) 146/73   Pulse: 76   Temp: 97.8 F (36.6 C)   Resp: 19   SpO2: 93% 93%  TempSrc: Oral   CMP showing potassium of 3.4 creatinine of 1.20(CKD stage IIIa), normal LFTs. proBNP of 3241.0 CBC showing white count of 13.7 hemoglobin of 10.8 normal platelets of 285. Viral panel negative for flu RSV and COVID. Chest x-ray done today shows fine reticular bilateral interstitial opacities concerning for viral infection or interstitial edema. EKG shows sinus rhythm at 73 with a PR of 198 QRS of 70 and QTc of 436, low voltage diffusely, no ST elevations, nonspecific T wave changes in inferior anterior leads. A/P: >>SOB/ Cough / wheezing at PCP office/ Hypoxia 89 and 82 with ambulating.  -Procalcitonin. -  Bedside Swallow eval.- baseline weight- manual bp to adjust lasix  dose.  -Lasix  iv 20 or 40 mg. - Pt accepted to cardiac tele.  No charge note.

## 2023-08-18 NOTE — H&P (Incomplete)
 History and Physical  Veronica Wolf:[id number] DOB: 11/06/1940 DOA: 08/18/2023  Referring physician: Accepted by Dr. Lydia Sams, [facility], hospitalist service. PCP: Dorrene Gaucher, NP  Outpatient Specialists: None. Patient coming from: Home.  Chief Complaint: Shortness of breath  HPI: Veronica Wolf is a 83 y.o. female with medical history significant for prior NSTEMI, COPD, not on oxygen supplementation at baseline, CKD 3B, hypertension, hyperlipidemia, GERD, chronic anxiety/depression, obesity, who initially presented to her primary care provider due to shortness of breath x 1 week.  While in her PCPs office she was noted to be hypoxic with O2 saturation of 89% on room air and was advised to go to the ER for further evaluation.  The patient presented to [facility] ED where she was noted to have elevated BNP and a negative procalcitonin.  Chest x-ray was equivocal.  Also complained of epigastric pain and admits to NSAIDs use, ibuprofen and Aleve.  Lipase level was within normal range 17.    Due to concern for acute CHF, EDP requested admission for further evaluation and management.  In the ED, the patient's O2 saturation was improved on 2 L nasal cannula to the 90s.  Additionally, she received 1 dose of IV Lasix  20 mg x 1 with improvement of her symptomatology.  The patient was accepted by Dr. Lydia Sams, [facility], hospitalist service, and transferred to [facility] telemetry cardiac unit as inpatient status.  At the time of this visit, the patient felt better.  She is in the room at [facility] accompanied by her daughter.  A CT angio chest showed no evidence of pulmonary embolus however revealed left lung patchy infiltrates concerning for pneumonia.  Started on Rocephin and IV azithromycin  empirically for CAP.  ED Course: Temperature 98.4.  BP 145/62, pulse of 64, respiratory rate 24, O2 saturation 100% on 2 L.  Lab studies notable for WBC 13.7, hemoglobin 10.8, platelet count 285.  Sodium 126, potassium 3.4,  glucose 108, creatinine 1.20 with GFR 44, proBNP 3241.  Magnesium 1.7.  Procalcitonin less than 0.10.  Review of Systems: Review of systems as noted in the HPI. All other systems reviewed and are negative.   Past Medical History:  Diagnosis Date   Allergic rhinitis    Allergy    seasonal allergies   Anxiety    CKD (chronic kidney disease) 04/04/2017   COPD (chronic obstructive pulmonary disease) (HCC)    uses inhaler   Depression    Fatty liver    GERD (gastroesophageal reflux disease)    Gout    hx of   Hyperlipidemia    on meds   Hypertension    on meds   Myocardial infarction [facility])    Peptic ulcer 01/04/2003   Vertigo    Past Surgical History:  Procedure Laterality Date   ABDOMINAL HYSTERECTOMY     APPENDECTOMY     BIOPSY  08/13/2020   Procedure: BIOPSY;  Surgeon: Annis Kinder, DO;  Location: [facility] ENDOSCOPY;  Service: Gastroenterology;;   CORONARY PRESSURE/FFR STUDY N/A 01/27/2022   Procedure: INTRAVASCULAR PRESSURE WIRE/FFR STUDY;  Surgeon: Kyra Phy, MD;  Location: [facility] INVASIVE CV LAB;  Service: Cardiovascular;  Laterality: N/A;   ESOPHAGOGASTRODUODENOSCOPY (EGD) WITH PROPOFOL  N/A 08/13/2020   Procedure: ESOPHAGOGASTRODUODENOSCOPY (EGD) WITH PROPOFOL ;  Surgeon: Annis Kinder, DO;  Location: [facility] ENDOSCOPY;  Service: Gastroenterology;  Laterality: N/A;   LEFT HEART CATH AND CORONARY ANGIOGRAPHY N/A 06/13/2017   Procedure: LEFT HEART CATH AND CORONARY ANGIOGRAPHY;  Surgeon: Arleen Lacer, MD;  Location:  [facility] INVASIVE CV LAB;  Service: Cardiovascular;  Laterality: N/A;   LEFT HEART CATH AND CORONARY ANGIOGRAPHY N/A 01/27/2022   Procedure: LEFT HEART CATH AND CORONARY ANGIOGRAPHY;  Surgeon: Kyra Phy, MD;  Location: [facility] INVASIVE CV LAB;  Service: Cardiovascular;  Laterality: N/A;   RIGHT/LEFT HEART CATH AND CORONARY ANGIOGRAPHY N/A 09/16/2017   Procedure: RIGHT/LEFT HEART CATH AND CORONARY ANGIOGRAPHY;  Surgeon: Swaziland, Peter M, MD;  Location: [facility] INVASIVE  CV LAB;  Service: Cardiovascular;  Laterality: N/A;   SAVORY DILATION N/A 08/13/2020   Procedure: SAVORY DILATION;  Surgeon: Annis Kinder, DO;  Location: [facility] ENDOSCOPY;  Service: Gastroenterology;  Laterality: N/A;   ULTRASOUND GUIDANCE FOR VASCULAR ACCESS  09/16/2017   Procedure: Ultrasound Guidance For Vascular Access;  Surgeon: Swaziland, Peter M, MD;  Location: [facility] INVASIVE CV LAB;  Service: Cardiovascular;;   WISDOM TOOTH EXTRACTION      Social History:  reports that she quit smoking about 23 years ago. Her smoking use included cigarettes. She has been exposed to tobacco smoke. She has never used smokeless tobacco. She reports that she does not currently use alcohol. She reports that she does not use drugs.   Allergies  Allergen Reactions   Ibuprofen Other (See Comments)    Pt has hx of peptic ulcer and diverticulitis.     Family History  Problem Relation Age of Onset   Breast cancer Mother    Stroke Father    Stomach cancer Neg Hx    Colon cancer Neg Hx    Pancreatic cancer Neg Hx    Esophageal cancer Neg Hx    Colon polyps Neg Hx    Rectal cancer Neg Hx       Prior to Admission medications   Medication Sig Start Date End Date Taking? Authorizing Provider  albuterol  (PROVENTIL ) (2.5 MG/3ML) 0.083% nebulizer solution USE 1 VIAL IN NEBULIZER EVERY 6 HOURS AS NEEDED FOR WHEEZING AND FOR SHORTNESS OF BREATH 03/20/21  Yes Dorrene Gaucher, NP  albuterol  (VENTOLIN  HFA) 108 (90 Base) MCG/ACT inhaler Inhale 2 puffs into the lungs every 6 (six) hours as needed for wheezing or shortness of breath. 11/29/22  Yes Dorrene Gaucher, NP  allopurinol  (ZYLOPRIM ) 100 MG tablet Take 2 tablets (200 mg total) by mouth daily. 06/09/23  Yes Dorrene Gaucher, NP  aspirin  81 MG EC tablet Take 1 tablet (81 mg total) by mouth daily. Swallow whole. 10/28/20  Yes Tobb, Kardie, DO  atorvastatin  (LIPITOR ) 80 MG tablet Take 1 tablet (80 mg total) by mouth daily at 6 PM 05/20/23  Yes O'Sullivan, Melissa,  NP  diazepam  (VALIUM ) 10 MG tablet Take 1 tablet (10 mg total) by mouth every 12 (twelve) hours as needed for anxiety. 08/10/23  Yes O'Sullivan, Melissa, NP  famotidine  (PEPCID ) 20 MG tablet Take 1 tablet (20 mg total) by mouth in the morning and at bedtime. 07/07/23  Yes Dorrene Gaucher, NP  Blood Glucose Monitoring Suppl (ACCU-CHEK GUIDE ME) w/Device KIT Use to test blood sugar in the morning, at noon, and at bedtime. 07/26/22   O'Sullivan, Melissa, NP  clobetasol  ointment (TEMOVATE ) 0.05 % Apply 1 Application topically 2 (two) times daily. For up to 2 weeks. 11/17/22   Dorrene Gaucher, NP  colchicine  0.6 MG tablet Take 0.6 mg by mouth daily as needed (gout).    [provider]  dicyclomine  (BENTYL ) 10 MG capsule Take 1 capsule (10 mg total) by mouth 2 (two) times daily. 03/24/23   Arlee Bellows, NP  escitalopram  (LEXAPRO ) 20 MG  tablet Take 1 tablet (20 mg total) by mouth daily with lunch 05/20/23   O'Sullivan, Melissa, NP  ezetimibe  (ZETIA ) 10 MG tablet Take 1 tablet (10 mg total) by mouth daily with lunch. 06/09/23   O'Sullivan, Melissa, NP  isosorbide  mononitrate (IMDUR ) 60 MG 24 hr tablet Take 1 tablet (60 mg total) by mouth daily at 6 PM. 06/09/23   O'Sullivan, Melissa, NP  levothyroxine  (SYNTHROID ) 25 MCG tablet Take 1 tablet (25 mcg total) by mouth before breakfast. 05/20/23   Neda Balk, MD  lisinopril  (ZESTRIL ) 2.5 MG tablet Take 1 tablet (2.5 mg total) by mouth daily with lunch. 06/09/23   O'Sullivan, Melissa, NP  meclizine  (ANTIVERT ) 25 MG tablet Take 1 tablet (25 mg total) by mouth 3 (three) times daily as needed for dizziness. 12/30/22   O'Sullivan, Melissa, NP  metoprolol  succinate (TOPROL -XL) 25 MG 24 hr tablet Take 0.5 tablets (12.5 mg total) by mouth every evening. 05/09/23   O'Sullivan, Melissa, NP  nitroGLYCERIN  (NITROSTAT ) 0.4 MG SL tablet Place 1 tablet (0.4 mg total) under the tongue every 5 (five) minutes as needed for chest pain. 10/28/20   Tobb, Kardie, DO  nystatin   (MYCOSTATIN /NYSTOP ) powder Apply 1 Application topically to the affected area(s) 2 (two) times daily. 11/14/22   O'Sullivan, Melissa, NP  potassium chloride  SA (KLOR-CON  M) 20 MEQ tablet Take 1 tablet (20 mEq total) by mouth daily at 12 noon. 05/20/23   O'Sullivan, Melissa, NP  Probiotic Product (PROBIOTIC DAILY) CAPS Take 1 tablet by mouth daily. 11/30/22   Everlina Hock, NP  Respiratory Therapy Supplies (NEBULIZER/TUBING/MOUTHPIECE) KIT Use as directed 08/26/21   Dorrene Gaucher, NP  senna (SENOKOT) 8.6 MG TABS tablet Take 1 tablet (8.6 mg total) by mouth at bedtime. 07/19/23   O'Sullivan, Melissa, NP  sucralfate  (CARAFATE ) 1 g tablet Take 1 tablet (1 g total) by mouth 4 (four) times daily -  with meals and at bedtime. Take one hour prior to morning medications. 07/15/23   O'Sullivan, Melissa, NP  valACYclovir  (VALTREX ) 500 MG tablet Take 1 tablet (500 mg total) by mouth every other day. 04/22/23   Dorrene Gaucher, NP    Physical Exam: BP 136/77 (BP Location: Left Arm)   Pulse 66   Temp 98.4 F (36.9 C) (Oral)   Resp 20   Ht 5\' 4"  (1.626 m)   Wt 88.9 kg   SpO2 96%   BMI 33.64 kg/m   General: 83 y.o. year-old female well developed well nourished in no acute distress.  Alert and oriented x3. Cardiovascular: Regular rate and rhythm with no rubs or gallops.  No thyromegaly.  Right JVD noted.  Trace lower extremity edema bilaterally.   Respiratory: Mild rales at bases.  Poor inspiratory effort. Abdomen: Soft nontender nondistended with normal bowel sounds x4 quadrants. Muskuloskeletal: No cyanosis or clubbing noted bilaterally Neuro: CN II-XII intact, strength, sensation, reflexes Skin: No ulcerative lesions noted or rashes Psychiatry: Judgement and insight appear normal. Mood is appropriate for condition and setting          Labs on Admission:  Basic Metabolic Panel: Recent Labs  Lab 08/18/23 1058 08/18/23 1332  NA 146*  --   K 3.4*  --   CL 109  --   CO2 26  --   GLUCOSE  108*  --   BUN 12  --   CREATININE 1.20*  --   CALCIUM  8.6*  --   MG  --  1.7   Liver Function Tests: Recent Labs  Lab  08/18/23 1058  AST 16  ALT 9  ALKPHOS 72  BILITOT <0.2  PROT 6.9  ALBUMIN 3.3*   Recent Labs  Lab 08/18/23 1058  LIPASE 17   No results for input(s): "AMMONIA" in the last 168 hours. CBC: Recent Labs  Lab 08/18/23 1041  WBC 13.7*  HGB 10.8*  HCT 34.8*  MCV 92.6  PLT 285   Cardiac Enzymes: No results for input(s): "CKTOTAL", "CKMB", "CKMBINDEX", "TROPONINI" in the last 168 hours.  BNP (last 3 results) No results for input(s): "BNP" in the last 8760 hours.  ProBNP (last 3 results) Recent Labs    08/18/23 1130  PROBNP 3,241.0*    CBG: Recent Labs  Lab 08/18/23 1426 08/18/23 1656  GLUCAP 92 97    Radiological Exams on Admission: DG Chest 2 View Result Date: 08/18/2023 CLINICAL DATA:  shortness of breath EXAM: CHEST - 2 VIEW COMPARISON:  March 19, 2022 FINDINGS: Fine reticular, bilateral interstitial opacities. No focal airspace consolidation, pneumothorax, or pleural effusion. No cardiomegaly. Tortuous aorta. No acute fracture or destructive lesions. Multilevel degenerative disc disease of the spine. IMPRESSION: Fine, reticular bilateral interstitial opacities, which may reflect changes of an atypical/viral infection or interstitial edema. Laboratory correlation recommended. Electronically Signed   By: Rance Burrows M.D.   On: 08/18/2023 12:07    EKG: I independently viewed the EKG done and my findings are as followed: Normal sinus rhythm rate of 73.  Nonspecific ST-T changes.  QTC 436.  Assessment/Plan Present on Admission:  Acute hypoxemic respiratory failure (HCC)  Principal Problem:   Acute hypoxemic respiratory failure (HCC)  Acute hypoxic respiratory failure secondary to left lower lobe pneumonia Not on oxygen supplementation at baseline Currently on 2 L to maintain O2 saturation above 92% Continue to treat underlying  conditions Currently on Rocephin, IV azithromycin , and IV Lasix  20 mg twice daily x 2 doses Wean off oxygen supplementation as tolerated Incentive spirometer Early ambulation Bronchodilators  Left lower lobe pneumonia, CAP, seen on CT scan, POA Started Rocephin and azithromycin  Monitor fever curve and WBCs  Acute on chronic HFpEF History of NSTEMI in 2019 Has not followed up with cardiology Last 2D echo done in 2019 revealed LVEF 55 to 60% with grade 1 diastolic dysfunction with some mild hypokinesis of the mid apical lateral and inferolateral myocardium. IV Lasix  20 mg twice daily x 2 doses Monitor strict I's and O's and daily weight Follow repeat 2D echo  Epigastric pain in the setting of NSAIDs use History of GERD History of esophageal stenosis status post dilation in 2022 Advised to avoid NSAIDs Started IV Protonix  40 mg daily x 2 Recommend close follow-up with [facility] outpatient  Hyperlipidemia Resume home Lipitor  and Zetia   Type 2 diabetes with hyperglycemia. Last hemoglobin A1c 6.8 on 04/22/2023 Heart healthy carb modified diet Insulin sliding scale.  Hypothyroidism Resume home levothyroxine   Chronic anxiety Resume home regimen.  Obesity BMI 33 Recommend weight loss outpatient with regular physical activity and healthy dieting.  Generalized weakness PT OT evaluation Fall precautions  CKD 3B At baseline Avoid nephrotoxic agents and hypotension Monitor urine output.  Hypertension BPs are stable Resume home oral antihypertensives as blood pressure allows Monitor vital signs    Critical care time: 55 minutes.    DVT prophylaxis: Subcu Lovenox daily.  Code Status: Full code.  Family Communication: Updated patient's daughter at bedside.  Disposition Plan: Admitted to telemetry cardiac unit, admitted by Dr. Lydia Sams, [facility].  Consults called: None.  Admission status: Inpatient status.  Status is: Inpatient The patient required at least 2  midnights for further evaluation and treatment of present condition.   Bary Boss MD [facility] Pager [phone]  If 7PM-7AM, please contact night-coverage [email or web address] Password [email or web address]  08/18/2023, 7:53 PM

## 2023-08-18 NOTE — ED Provider Notes (Cosign Needed Addendum)
 [facility] Provider Note   CSN: [id number] Arrival date & time: 08/18/23  1031     History  Chief Complaint  Patient presents with   Shortness of Breath    Veronica Wolf is a 83 y.o. female with history of CKD, COPD, hyperlipidemia, hypertension, myocardial infarction, presents with concern for dry cough and shortness of breath over the past 2 to 3 weeks.  Was seen by PCP earlier today noted to be hypoxic to 89% on room air, dropping to 82% with ambulation.  She improved to 93% with 2 L nasal cannula.  Does not normally use oxygen at home.  Denying any fevers, chills, chest pain.  Does report some mild epigastric pain for the past couple weeks.  States she has not been having complete bowel movements, only having very small ones.  Reports she is still passing gas, denies any nausea or vomiting.   Shortness of Breath      Home Medications Prior to Admission medications   Medication Sig Start Date End Date Taking? Authorizing Provider  albuterol  (PROVENTIL ) (2.5 MG/3ML) 0.083% nebulizer solution USE 1 VIAL IN NEBULIZER EVERY 6 HOURS AS NEEDED FOR WHEEZING AND FOR SHORTNESS OF BREATH 03/20/21   O'Sullivan, Melissa, NP  albuterol  (VENTOLIN  HFA) 108 (90 Base) MCG/ACT inhaler Inhale 2 puffs into the lungs every 6 (six) hours as needed for wheezing or shortness of breath. 11/29/22   Dorrene Gaucher, NP  allopurinol  (ZYLOPRIM ) 100 MG tablet Take 2 tablets (200 mg total) by mouth daily. 06/09/23   Dorrene Gaucher, NP  aspirin  81 MG EC tablet Take 1 tablet (81 mg total) by mouth daily. Swallow whole. 10/28/20   Tobb, Kardie, DO  atorvastatin  (LIPITOR ) 80 MG tablet Take 1 tablet (80 mg total) by mouth daily at 6 PM 05/20/23   O'Sullivan, Melissa, NP  Blood Glucose Monitoring Suppl (ACCU-CHEK GUIDE ME) w/Device KIT Use to test blood sugar in the morning, at noon, and at bedtime. 07/26/22   O'Sullivan, Melissa, NP  clobetasol  ointment (TEMOVATE )  0.05 % Apply 1 Application topically 2 (two) times daily. For up to 2 weeks. 11/17/22   Dorrene Gaucher, NP  colchicine  0.6 MG tablet Take 0.6 mg by mouth daily as needed (gout).    [provider]  diazepam  (VALIUM ) 10 MG tablet Take 1 tablet (10 mg total) by mouth every 12 (twelve) hours as needed for anxiety. 08/10/23   O'Sullivan, Melissa, NP  dicyclomine  (BENTYL ) 10 MG capsule Take 1 capsule (10 mg total) by mouth 2 (two) times daily. 03/24/23   Arlee Bellows, NP  escitalopram  (LEXAPRO ) 20 MG tablet Take 1 tablet (20 mg total) by mouth daily with lunch 05/20/23   O'Sullivan, Melissa, NP  ezetimibe  (ZETIA ) 10 MG tablet Take 1 tablet (10 mg total) by mouth daily with lunch. 06/09/23   O'Sullivan, Melissa, NP  famotidine  (PEPCID ) 20 MG tablet Take 1 tablet (20 mg total) by mouth in the morning and at bedtime. 07/07/23   O'Sullivan, Melissa, NP  isosorbide  mononitrate (IMDUR ) 60 MG 24 hr tablet Take 1 tablet (60 mg total) by mouth daily at 6 PM. 06/09/23   O'Sullivan, Melissa, NP  levothyroxine  (SYNTHROID ) 25 MCG tablet Take 1 tablet (25 mcg total) by mouth before breakfast. 05/20/23   Neda Balk, MD  lisinopril  (ZESTRIL ) 2.5 MG tablet Take 1 tablet (2.5 mg total) by mouth daily with lunch. 06/09/23   O'Sullivan, Melissa, NP  meclizine  (ANTIVERT ) 25 MG tablet Take 1  tablet (25 mg total) by mouth 3 (three) times daily as needed for dizziness. 12/30/22   O'Sullivan, Melissa, NP  metoprolol  succinate (TOPROL -XL) 25 MG 24 hr tablet Take 0.5 tablets (12.5 mg total) by mouth every evening. 05/09/23   O'Sullivan, Melissa, NP  nitroGLYCERIN  (NITROSTAT ) 0.4 MG SL tablet Place 1 tablet (0.4 mg total) under the tongue every 5 (five) minutes as needed for chest pain. 10/28/20   Tobb, Kardie, DO  nystatin  (MYCOSTATIN /NYSTOP ) powder Apply 1 Application topically to the affected area(s) 2 (two) times daily. 11/14/22   O'Sullivan, Melissa, NP  potassium chloride  SA (KLOR-CON  M) 20 MEQ tablet Take 1 tablet (20 mEq  total) by mouth daily at 12 noon. 05/20/23   O'Sullivan, Melissa, NP  Probiotic Product (PROBIOTIC DAILY) CAPS Take 1 tablet by mouth daily. 11/30/22   Everlina Hock, NP  Respiratory Therapy Supplies (NEBULIZER/TUBING/MOUTHPIECE) KIT Use as directed 08/26/21   Dorrene Gaucher, NP  senna (SENOKOT) 8.6 MG TABS tablet Take 1 tablet (8.6 mg total) by mouth at bedtime. 07/19/23   O'Sullivan, Melissa, NP  sucralfate  (CARAFATE ) 1 g tablet Take 1 tablet (1 g total) by mouth 4 (four) times daily -  with meals and at bedtime. Take one hour prior to morning medications. 07/15/23   O'Sullivan, Melissa, NP  valACYclovir  (VALTREX ) 500 MG tablet Take 1 tablet (500 mg total) by mouth every other day. 04/22/23   O'Sullivan, Melissa, NP      Allergies    Ibuprofen    Review of Systems   Review of Systems  Respiratory:  Positive for shortness of breath.     Physical Exam Updated Vital Signs BP 130/62 (BP Location: Left Wrist)   Pulse 71   Temp 97.7 F (36.5 C) (Oral)   Resp 15   Wt 88.9 kg   SpO2 100%   BMI 33.64 kg/m  Physical Exam Vitals and nursing note reviewed.  Constitutional:      General: She is not in acute distress.    Appearance: She is well-developed. She is obese.  HENT:     Head: Normocephalic and atraumatic.  Eyes:     Conjunctiva/sclera: Conjunctivae normal.  Cardiovascular:     Rate and Rhythm: Normal rate and regular rhythm.     Heart sounds: No murmur heard. Pulmonary:     Effort: Pulmonary effort is normal. No respiratory distress.     Breath sounds: Rhonchi present.     Comments: Patient talking in short sentences, requiring 2 L nasal cannula Rhonchi in lungs bilaterally Dry cough on exam Abdominal:     Palpations: Abdomen is soft.     Tenderness: There is no abdominal tenderness.  Musculoskeletal:        General: No swelling.     Cervical back: Neck supple.     Comments: No right or left lower extremity edema  Skin:    General: Skin is warm and dry.      Capillary Refill: Capillary refill takes less than 2 seconds.  Neurological:     Mental Status: She is alert.  Psychiatric:        Mood and Affect: Mood normal.     ED Results / Procedures / Treatments   Labs (all labs ordered are listed, but only abnormal results are displayed) Labs Reviewed  CBC - Abnormal; Notable for the following components:      Result Value   WBC 13.7 (*)    RBC 3.76 (*)    Hemoglobin 10.8 (*)    HCT  34.8 (*)    RDW 16.8 (*)    nRBC 0.4 (*)    All other components within normal limits  COMPREHENSIVE METABOLIC PANEL WITH GFR - Abnormal; Notable for the following components:   Sodium 146 (*)    Potassium 3.4 (*)    Glucose, Bld 108 (*)    Creatinine, Ser 1.20 (*)    Calcium  8.6 (*)    Albumin 3.3 (*)    GFR, Estimated 44 (*)    All other components within normal limits  PRO BRAIN NATRIURETIC PEPTIDE - Abnormal; Notable for the following components:   Pro Brain Natriuretic Peptide 3,241.0 (*)    All other components within normal limits  D-DIMER, QUANTITATIVE - Abnormal; Notable for the following components:   D-Dimer, Quant 1.77 (*)    All other components within normal limits  RESP PANEL BY RT-PCR (RSV, FLU A&B, COVID)  RVPGX2  LIPASE, BLOOD  PROCALCITONIN  MAGNESIUM  CBG MONITORING, ED  CBG MONITORING, ED  TROPONIN T, HIGH SENSITIVITY    EKG EKG Interpretation Date/Time:  Thursday Aug 18 2023 10:45:56 EDT Ventricular Rate:  73 PR Interval:  190 QRS Duration:  70 QT Interval:  396 QTC Calculation: 436 R Axis:   28  Text Interpretation: Normal sinus rhythm Low voltage QRS Cannot rule out Anterior infarct , age undetermined Abnormal ECG When compared with ECG of 27-Jan-2022 15:01, PREVIOUS ECG IS PRESENT since last tracing no significant change Confirmed by Hershel Los [phone]) on 08/18/2023 2:34:35 PM  Radiology DG Chest 2 View Result Date: 08/18/2023 CLINICAL DATA:  shortness of breath EXAM: CHEST - 2 VIEW COMPARISON:  March 19, 2022 FINDINGS: Fine reticular, bilateral interstitial opacities. No focal airspace consolidation, pneumothorax, or pleural effusion. No cardiomegaly. Tortuous aorta. No acute fracture or destructive lesions. Multilevel degenerative disc disease of the spine. IMPRESSION: Fine, reticular bilateral interstitial opacities, which may reflect changes of an atypical/viral infection or interstitial edema. Laboratory correlation recommended. Electronically Signed   By: Rance Burrows M.D.   On: 08/18/2023 12:07    Procedures Procedures    Medications Ordered in ED Medications  albuterol  (PROVENTIL ) (2.5 MG/3ML) 0.083% nebulizer solution 2.5 mg (has no administration in time range)  aspirin  EC tablet 81 mg (81 mg Oral Given 08/18/23 1421)  atorvastatin  (LIPITOR ) tablet 80 mg (has no administration in time range)  escitalopram  (LEXAPRO ) tablet 20 mg (has no administration in time range)  nitroGLYCERIN  (NITROSTAT ) SL tablet 0.4 mg (has no administration in time range)  insulin aspart (novoLOG) injection 0-9 Units ( Subcutaneous Not Given 08/18/23 1700)  ipratropium-albuterol  (DUONEB) 0.5-2.5 (3) MG/3ML nebulizer solution (3 mLs  Given 08/18/23 1054)  albuterol  (PROVENTIL ) (2.5 MG/3ML) 0.083% nebulizer solution (2.5 mg  Given 08/18/23 1053)  furosemide  (LASIX ) injection 20 mg (20 mg Intravenous Given 08/18/23 1422)  acetaminophen  (TYLENOL ) tablet 1,000 mg (1,000 mg Oral Given 08/18/23 1708)    ED Course/ Medical Decision Making/ A&P Clinical Course as of 08/18/23 1726  Thu Aug 18, 2023  1444 RN took manual BP which was 130/80. Weight 190lbs. Will start with 20mg  IV lasix  and re-evaluate [AF]  1721 D dimer elevated at 1.77, ordered CTA chest for further evaluation [AF]    Clinical Course User Index [AF] Rexie Catena, PA-C                                 Medical Decision Making Amount and/or Complexity of Data Reviewed Labs: ordered. Radiology:  ordered.  Risk OTC drugs. Prescription drug  management. Decision regarding hospitalization.     Differential diagnosis includes but is not limited to ACS, arrhythmia, CHF, pneumonia, COPD exacerbation, COVID, flu, RSV, URI, constipation, SBO  ED Course:  Upon initial evaluation, patient is ill-appearing, but in no acute distress.  Talking in short sentences, requiring 2 L nasal cannula which she does normally not require at home.  Lungs with diffuse rhonchi.  Abdomen soft nontender.  She was given DuoNeb upon arrival by RT.  Labs Ordered: I Ordered, and personally interpreted labs.  The pertinent results include:   CBC with leukocytosis of 13.7, low hemoglobin at 10.8. CMP with hypokalemia 3.4, elevated creatinine 1.2 which appears to be at baseline proBNP elevated at 3241 COVID, flu, RSV negative Troponin under 15 Lipase within normal limits  Imaging Studies ordered: I ordered imaging studies including chest x-ray I independently visualized the imaging with scope of interpretation limited to determining acute life threatening conditions related to emergency care. Imaging showed  Fine reticular bilateral residual opacities, may reflect changes of atypical/viral infection or interstitial edema I agree with the radiologist interpretation   Cardiac Monitoring: / EKG: The patient was maintained on a cardiac monitor.  I personally viewed and interpreted the cardiac monitored which showed an underlying rhythm of: Normal sinus rhythm, no ST changes   Consultations Obtained: I requested consultation with the Hospitalist Dr. Lydia Sams,  and discussed lab and imaging findings as well as pertinent plan - they recommend: admission for further management. They request that magnesium and procalcitonin be added on to labs. Requested weight be taken and manual BP be obtained. Recommended giving patient first dose of lasix  here.  I have ordered requested labs/ medications   Medications Given: Duoneb Lasix  20mg  IV  Upon re-evaluation,  patient stable. Lung rhonchi not significant improved by DuoNeb treatment.  Discussed with patient and family at bedside that symptoms may be due to new onset heart failure given elevated BNP and interstitial edema on chest x-ray, and I recommend admission inpatient for further workup and management.  They are in agreement with this plan.  Low concern for ACS as patient without chest pain, troponin within normal limits, EKG with no ST changes, shortness of breath has been ongoing for about 3 weeks.  No signs of pneumonia. She does report epigastric pain, but no elevation in LFTs or lipase, abdomen soft and non-tender, lower concern for acute intra-abdominal pathology. Suspect constipation is contributing to her pain. However, no signs of SBO; she is still passing gas and small bowel movement, no vomiting.   Addendum 6:56 PM.  Spoke to hospitalist Dr. Lydia Sams again.  She requests further evaluation for PE.  Patient denies any recent surgeries or hospitalizations, no long plane or car rides. States she is fairly active at home, no recent COVID infections.  No hormonal medication use.  Feel she is lower risk for PE at this time, will proceed with D-dimer for evaluation.  20 mg Lasix  has been given, she has 350 mL of output on pure wick. Breathing more comfortably, talking in fuller sentences  5:22 pm: D-dimer elevated at 1.77, will add on CTA chest. Will see if this can be obtained before transfer. If not will need to be performed inpatient.      Impression: Shortness of breath with new oxygen requirement, possible new onset heart failure Constipation  Disposition:  Admission with hospitalist Dr. Lydia Sams   Record Review: External records from outside source obtained and reviewed including PCP  note from earlier today where patient was found to be hypoxic to 89% on room air, 82% while ambulating Echocardiogram from 2019 reviewed which showed normal LVEF at 55-60% Left heart cath from 2023 showed CAD,  occluded right radial artery     This chart was dictated using voice recognition software, Dragon. Despite the best efforts of this provider to proofread and correct errors, errors may still occur which can change documentation meaning.          Final Clinical Impression(s) / ED Diagnoses Final diagnoses:  SOB (shortness of breath)  Elevated brain natriuretic peptide (BNP) level    Rx / DC Orders ED Discharge Orders     None         Rexie Catena, PA-C 08/18/23 1445    Rexie Catena, PA-C 08/18/23 1726    Hershel Los, MD 08/19/23 [phone]

## 2023-08-19 ENCOUNTER — Other Ambulatory Visit (HOSPITAL_COMMUNITY): Payer: Self-pay

## 2023-08-19 ENCOUNTER — Inpatient Hospital Stay (HOSPITAL_COMMUNITY)

## 2023-08-19 DIAGNOSIS — R0602 Shortness of breath: Secondary | ICD-10-CM | POA: Diagnosis not present

## 2023-08-19 DIAGNOSIS — J441 Chronic obstructive pulmonary disease with (acute) exacerbation: Secondary | ICD-10-CM

## 2023-08-19 DIAGNOSIS — J189 Pneumonia, unspecified organism: Secondary | ICD-10-CM | POA: Diagnosis not present

## 2023-08-19 DIAGNOSIS — I5030 Unspecified diastolic (congestive) heart failure: Secondary | ICD-10-CM | POA: Diagnosis not present

## 2023-08-19 DIAGNOSIS — J9601 Acute respiratory failure with hypoxia: Secondary | ICD-10-CM | POA: Diagnosis not present

## 2023-08-19 LAB — ECHOCARDIOGRAM COMPLETE
AR max vel: 2.15 cm2
AV Area VTI: 2.34 cm2
AV Area mean vel: 2.07 cm2
AV Mean grad: 5 mmHg
AV Peak grad: 6.7 mmHg
Ao pk vel: 1.29 m/s
Area-P 1/2: 4.39 cm2
Height: 64 in
S' Lateral: 2.1 cm
Weight: 3068.8 [oz_av]

## 2023-08-19 LAB — CBC
HCT: 34.2 % — ABNORMAL LOW (ref 36.0–46.0)
Hemoglobin: 11 g/dL — ABNORMAL LOW (ref 12.0–15.0)
MCH: 29.6 pg (ref 26.0–34.0)
MCHC: 32.2 g/dL (ref 30.0–36.0)
MCV: 91.9 fL (ref 80.0–100.0)
Platelets: 251 10*3/uL (ref 150–400)
RBC: 3.72 MIL/uL — ABNORMAL LOW (ref 3.87–5.11)
RDW: 16.5 % — ABNORMAL HIGH (ref 11.5–15.5)
WBC: 15 10*3/uL — ABNORMAL HIGH (ref 4.0–10.5)
nRBC: 0 % (ref 0.0–0.2)

## 2023-08-19 LAB — BASIC METABOLIC PANEL WITH GFR
Anion gap: 12 (ref 5–15)
BUN: 12 mg/dL (ref 8–23)
CO2: 26 mmol/L (ref 22–32)
Calcium: 8.4 mg/dL — ABNORMAL LOW (ref 8.9–10.3)
Chloride: 103 mmol/L (ref 98–111)
Creatinine, Ser: 1.31 mg/dL — ABNORMAL HIGH (ref 0.44–1.00)
GFR, Estimated: 40 mL/min — ABNORMAL LOW (ref >60)
Glucose, Bld: 116 mg/dL — ABNORMAL HIGH (ref 70–99)
Potassium: 3.4 mmol/L — ABNORMAL LOW (ref 3.5–5.1)
Sodium: 141 mmol/L (ref 135–145)

## 2023-08-19 LAB — MAGNESIUM: Magnesium: 1.6 mg/dL — ABNORMAL LOW (ref 1.7–2.4)

## 2023-08-19 LAB — GLUCOSE, CAPILLARY
Glucose-Capillary: 94 mg/dL (ref 70–99)
Glucose-Capillary: 116 mg/dL — ABNORMAL HIGH (ref 70–99)
Glucose-Capillary: 110 mg/dL — ABNORMAL HIGH (ref 70–99)
Glucose-Capillary: 113 mg/dL — ABNORMAL HIGH (ref 70–99)
Glucose-Capillary: 93 mg/dL (ref 70–99)

## 2023-08-19 LAB — LACTIC ACID, PLASMA
Lactic Acid, Venous: 1.6 mmol/L (ref 0.5–1.9)
Lactic Acid, Venous: 2.9 mmol/L (ref 0.5–1.9)

## 2023-08-19 LAB — PHOSPHORUS: Phosphorus: 2 mg/dL — ABNORMAL LOW (ref 2.5–4.6)

## 2023-08-19 MED ORDER — EZETIMIBE 10 MG PO TABS
10.0000 mg | ORAL_TABLET | Freq: Every day | ORAL | Status: DC
  Administered 2023-08-19 – 2023-08-22 (×4): 10 mg via ORAL
  Filled 2023-08-19 (×4): qty 1

## 2023-08-19 MED ORDER — POLYETHYLENE GLYCOL 3350 17 G PO PACK: 17.0000 g | PACK | Freq: Every day | ORAL | Status: DC | PRN

## 2023-08-19 MED ORDER — POTASSIUM CHLORIDE CRYS ER 20 MEQ PO TBCR
20.0000 meq | EXTENDED_RELEASE_TABLET | Freq: Every day | ORAL | Status: DC
  Administered 2023-08-19 – 2023-08-22 (×4): 20 meq via ORAL
  Filled 2023-08-19 (×4): qty 1

## 2023-08-19 MED ORDER — FUROSEMIDE 10 MG/ML IJ SOLN
20.0000 mg | Freq: Two times a day (BID) | INTRAMUSCULAR | Status: AC
  Administered 2023-08-19 (×2): 20 mg via INTRAVENOUS
  Filled 2023-08-19 (×2): qty 2

## 2023-08-19 MED ORDER — SODIUM CHLORIDE 0.9% FLUSH: 10.0000 mL | INTRAVENOUS | Status: DC | PRN

## 2023-08-19 MED ORDER — GUAIFENESIN 100 MG/5ML PO LIQD: 5.0000 mL | ORAL | Status: DC | PRN

## 2023-08-19 MED ORDER — SODIUM CHLORIDE 0.9 % IV SOLN
500.0000 mg | Freq: Every day | INTRAVENOUS | Status: DC
  Administered 2023-08-19 (×2): 500 mg via INTRAVENOUS
  Filled 2023-08-19 (×2): qty 5

## 2023-08-19 MED ORDER — LEVOTHYROXINE SODIUM 25 MCG PO TABS
25.0000 ug | ORAL_TABLET | Freq: Every day | ORAL | Status: DC
  Administered 2023-08-19 – 2023-08-22 (×4): 25 ug via ORAL
  Filled 2023-08-19 (×4): qty 1

## 2023-08-19 MED ORDER — DIAZEPAM 5 MG PO TABS
5.0000 mg | ORAL_TABLET | Freq: Two times a day (BID) | ORAL | Status: DC | PRN
  Administered 2023-08-19 – 2023-08-20 (×4): 5 mg via ORAL
  Filled 2023-08-19 (×5): qty 1

## 2023-08-19 MED ORDER — ENOXAPARIN SODIUM 40 MG/0.4ML IJ SOSY
40.0000 mg | PREFILLED_SYRINGE | Freq: Every day | INTRAMUSCULAR | Status: DC
  Administered 2023-08-19 – 2023-08-22 (×4): 40 mg via SUBCUTANEOUS
  Filled 2023-08-19 (×4): qty 0.4

## 2023-08-19 MED ORDER — SODIUM CHLORIDE 0.9% FLUSH
10.0000 mL | Freq: Two times a day (BID) | INTRAVENOUS | Status: DC
  Administered 2023-08-19 – 2023-08-22 (×6): 10 mL

## 2023-08-19 MED ORDER — PANTOPRAZOLE SODIUM 40 MG IV SOLR
40.0000 mg | Freq: Every day | INTRAVENOUS | Status: AC
  Administered 2023-08-19 – 2023-08-20 (×2): 40 mg via INTRAVENOUS
  Filled 2023-08-19 (×2): qty 10

## 2023-08-19 MED ORDER — METOPROLOL SUCCINATE ER 25 MG PO TB24
12.5000 mg | ORAL_TABLET | Freq: Every evening | ORAL | Status: DC
  Administered 2023-08-19 – 2023-08-21 (×3): 12.5 mg via ORAL
  Filled 2023-08-19 (×3): qty 1

## 2023-08-19 MED ORDER — PANTOPRAZOLE SODIUM 40 MG IV SOLR: 40.0000 mg | Freq: Two times a day (BID) | INTRAVENOUS | Status: DC

## 2023-08-19 MED ORDER — ACETAMINOPHEN 325 MG PO TABS
650.0000 mg | ORAL_TABLET | Freq: Four times a day (QID) | ORAL | Status: DC | PRN
  Administered 2023-08-19 – 2023-08-22 (×8): 650 mg via ORAL
  Filled 2023-08-19 (×8): qty 2

## 2023-08-19 MED ORDER — IPRATROPIUM-ALBUTEROL 0.5-2.5 (3) MG/3ML IN SOLN
3.0000 mL | Freq: Four times a day (QID) | RESPIRATORY_TRACT | Status: DC
  Administered 2023-08-19 – 2023-08-21 (×9): 3 mL via RESPIRATORY_TRACT
  Filled 2023-08-19 (×9): qty 3

## 2023-08-19 MED ORDER — PROCHLORPERAZINE EDISYLATE 10 MG/2ML IJ SOLN
5.0000 mg | Freq: Four times a day (QID) | INTRAMUSCULAR | Status: DC | PRN
  Administered 2023-08-20: 5 mg via INTRAVENOUS
  Filled 2023-08-19: qty 2

## 2023-08-19 MED ORDER — MAGNESIUM OXIDE -MG SUPPLEMENT 400 (240 MG) MG PO TABS
800.0000 mg | ORAL_TABLET | Freq: Once | ORAL | Status: AC
  Administered 2023-08-19: 800 mg via ORAL
  Filled 2023-08-19: qty 2

## 2023-08-19 MED ORDER — POTASSIUM CHLORIDE CRYS ER 20 MEQ PO TBCR
40.0000 meq | EXTENDED_RELEASE_TABLET | ORAL | Status: AC
  Administered 2023-08-19: 40 meq via ORAL
  Filled 2023-08-19: qty 2

## 2023-08-19 MED ORDER — SODIUM CHLORIDE 0.9 % IV SOLN
2.0000 g | Freq: Every day | INTRAVENOUS | Status: DC
  Administered 2023-08-19 – 2023-08-21 (×4): 2 g via INTRAVENOUS
  Filled 2023-08-19 (×4): qty 20

## 2023-08-19 MED ORDER — MELATONIN 5 MG PO TABS
5.0000 mg | ORAL_TABLET | Freq: Every evening | ORAL | Status: DC | PRN
  Administered 2023-08-20 – 2023-08-21 (×2): 5 mg via ORAL
  Filled 2023-08-19 (×2): qty 1

## 2023-08-19 NOTE — Progress Notes (Signed)

## 2023-08-19 NOTE — Evaluation (Signed)
 Occupational Therapy Evaluation Patient Details Name: Veronica Wolf MRN: [id number] DOB: Jan 28, 1941 Today's Date: 08/19/2023   History of Present Illness   83 y.o. female admitted 08/18/23 with SOB, hypoxia and  epigastric pain. Chest CT (+) left lung patchy infiltrates concerning for pneumonia. PMH: NSTEMI, COPD, not on oxygen supplementation at baseline, CKD 3B, hypertension, hyperlipidemia, GERD, chronic anxiety/depression, obesity.     Clinical Impressions Pt tangential during session and distracted by abdominal pain. Will need to clarify PLOF however pt states she lives independently with her daughter & son-in-law who assist with IADL tasks as needed. Pt unsteady at times, requiring min A with mobility and LB ADL tasks.  Difficult to achieve a reliable reading regarding SpO2 however noted minimal DOE with activity. Recommend HHOT at DC. Acute OT to follow.      If plan is discharge home, recommend the following:   A little help with walking and/or transfers;A little help with bathing/dressing/bathroom;Assist for transportation;Help with stairs or ramp for entrance;Assistance with cooking/housework;Direct supervision/assist for medications management;Direct supervision/assist for financial management     Functional Status Assessment   Patient has had a recent decline in their functional status and demonstrates the ability to make significant improvements in function in a reasonable and predictable amount of time.     Equipment Recommendations   Tub/shower seat     Recommendations for Other Services         Precautions/Restrictions   Precautions Precautions: Fall     Mobility Bed Mobility Overal bed mobility: Needs Assistance Bed Mobility: Supine to Sit     Supine to sit: Min assist          Transfers Overall transfer level: Needs assistance   Transfers: Sit to/from Stand Sit to Stand: Min assist           General transfer comment: A to power up  form toilet      Balance Overall balance assessment: Needs assistance   Sitting balance-Leahy Scale: Good       Standing balance-Leahy Scale: Fair                             ADL either performed or assessed with clinical judgement   ADL Overall ADL's : Needs assistance/impaired Eating/Feeding: Modified independent   Grooming: Contact guard assist;Standing Grooming Details (indicate cue type and reason): posterioro sway Upper Body Bathing: Set up;Sitting   Lower Body Bathing: Minimal assistance;Sit to/from stand   Upper Body Dressing : Set up;Sitting   Lower Body Dressing: Minimal assistance;Sit to/from stand   Toilet Transfer: Minimal assistance;Ambulation   Toileting- Clothing Manipulation and Hygiene: Contact guard assist Toileting - Clothing Manipulation Details (indicate cue type and reason): unaware bed/gown was wet    Pain limiting ADL Functional mobility during ADLs: Minimal assistance; risk for falls at this time       Vision Baseline Vision/History: 1 Wears glasses Patient Visual Report: No change from baseline Vision Assessment?: Wears glasses for reading     Perception         Praxis         Pertinent Vitals/Pain Pain Assessment Pain Assessment: Faces Faces Pain Scale: Hurts whole lot Pain Location: R flank Pain Descriptors / Indicators: Moaning, Sharp Pain Intervention(s): Limited activity within patient's tolerance, Patient requesting pain meds-RN notified, RN gave pain meds during session     Extremity/Trunk Assessment Upper Extremity Assessment Upper Extremity Assessment: Defer to OT evaluation   Lower Extremity Assessment Lower  Extremity Assessment: Generalized weakness   Cervical / Trunk Assessment Cervical / Trunk Assessment: Kyphotic   Communication Communication Communication: No apparent difficulties   Cognition Arousal: Alert Behavior During Therapy: Anxious Cognition: No family/caregiver present to determine  baseline (most likely at baseline; very tangential however able to redirect; slow processing; not aware of being SOB)                               Following commands: Intact       Cueing  General Comments      Difficulty obtaining SpO2 with nails colored and thick, finally reading some on thumb with color removed reading 91-95% on RA at rest   Exercises     Shoulder Instructions      Home Living Family/patient expects to be discharged to:: Private residence Living Arrangements: Children Available Help at Discharge: Family Type of Home: House Home Access: Stairs to enter Secretary/administrator of Steps: 3   Home Layout: Multi-level;Able to live on main level with bedroom/bathroom     Bathroom Shower/Tub: Producer, television/film/video: Standard Bathroom Accessibility: Yes How Accessible: Accessible via walker Home Equipment: BSC/3in1   Additional Comments: states staying with her daughter since toilet "blew up" brother had left her a house, he passed a few weeks ago, difficult to ascertain exact history      Prior Functioning/Environment Prior Level of Function : Patient poor historian/Family not available;Independent/Modified Independent                    OT Problem List: Decreased activity tolerance;Impaired balance (sitting and/or standing);Decreased safety awareness;Decreased knowledge of use of DME or AE;Cardiopulmonary status limiting activity;Obesity;Pain   OT Treatment/Interventions: Self-care/ADL training;Therapeutic exercise;Energy conservation;DME and/or AE instruction;Therapeutic activities;Cognitive remediation/compensation;Patient/family education;Balance training      OT Goals(Current goals can be found in the care plan section)   Acute Rehab OT Goals Patient Stated Goal: to get her pain under control OT Goal Formulation: With patient Time For Goal Achievement: 09/02/23 Potential to Achieve Goals: Good   OT Frequency:   Min 2X/week    Co-evaluation PT/OT/SLP Co-Evaluation/Treatment: Yes (partial session) Reason for Co-Treatment: Other (comment) (pain tolerance) PT goals addressed during session: Mobility/safety with mobility;Balance OT goals addressed during session: ADL's and self-care      AM-PAC OT "6 Clicks" Daily Activity     Outcome Measure Help from another person eating meals?: None Help from another person taking care of personal grooming?: A Little Help from another person toileting, which includes using toliet, bedpan, or urinal?: A Little Help from another person bathing (including washing, rinsing, drying)?: A Little Help from another person to put on and taking off regular upper body clothing?: A Little Help from another person to put on and taking off regular lower body clothing?: A Little 6 Click Score: 19   End of Session Equipment Utilized During Treatment: Gait belt Nurse Communication: Mobility status  Activity Tolerance: Patient tolerated treatment well Patient left: in chair;with call bell/phone within reach;with chair alarm set  OT Visit Diagnosis: Unsteadiness on feet (R26.81);Pain Pain - Right/Left: Right Pain - part of body:  (flank)                Time: 1130-1155 OT Time Calculation (min): 25 min Charges:  OT General Charges $OT Visit: 1 Visit OT Evaluation $OT Eval Moderate Complexity: 1 Mod  Nayelie Gionfriddo, OT/L   Acute OT Clinical Specialist Acute Rehabilitation Services  Pager [phone] Office [phone]   Jamesa Tedrick,HILLARY 08/19/2023, 1:34 PM

## 2023-08-19 NOTE — TOC CM/SW Note (Addendum)
 Transition of Care [facility]) - Inpatient Brief Assessment   Patient Details  Name: Veronica Wolf MRN: [id number] Date of Birth: Jun 09, 1940  Transition of [facility]) CM/SW Contact:    Jennett Model, RN Phone Number: 08/19/2023, 4:35 PM   Clinical Narrative: Patient gave this NCM permission to speak with daughter at bedside.  From home alone, has PCP and insurance on file, states has no HH services in place at this time or DME at home.  States family member will transport them home at [employer] and family is support system, states gets medications from [facility] on [address].  Pta self ambulatory.  Per pt eval rec HHPT,  NCM offered choice daughter and patient is ok with [facility],  NCM made referral to Amy with [facility], she is able to take referral.  Soc will begin 24 to 48 hrs post dc.  Patient does not want a rolling walker or shower chair.   Transition of Care Asessment: Insurance and Status: Insurance coverage has been reviewed Patient has primary care physician: Yes Home environment has been reviewed: home alone Prior level of function:: indep Prior/Current Home Services: No current home services Social Drivers of Health Review: SDOH reviewed no interventions necessary Readmission risk has been reviewed: Yes Transition of care needs: transition of care needs identified, TOC will continue to follow

## 2023-08-19 NOTE — Evaluation (Signed)
 Physical Therapy Evaluation Patient Details Name: Veronica Wolf MRN: [id number] DOB: [birth year] Today's Date: 08/19/2023  History of Present Illness  82 y.o. female admitted 08/18/23 with SOB, hypoxia and  epigastric pain. Chest CT (+) left lung patchy infiltrates concerning for pneumonia. PMH: NSTEMI, COPD, not on oxygen supplementation at baseline, CKD 3B, hypertension, hyperlipidemia, GERD, chronic anxiety/depression, obesity.  Clinical Impression  Patient seen in conjunction with OT as noted having significant pain with mobility up to EOB.  She reports normally independent at home and was living alone until recently having to stay with her daughter due to her home needing repair.  She reports normally independent at home and has no walking devices.  Currently needing min A for balance and ambulation in the room.  She was not aware she was here due to breathing issues, stating supposed to see a gastroenterologist.  She will likely progress with mobility and be able to d/c home with family support.  Though difficult to get a reading, SpO2 finally reading 91% on RA and pt denies dyspnea.  PT will continue to follow.  Recommend HHPT at d/c.           If plan is discharge home, recommend the following: Supervision due to cognitive status;Assistance with cooking/housework;A little help with bathing/dressing/bathroom;A little help with walking and/or transfers;Assist for transportation;Help with stairs or ramp for entrance   Can travel by private vehicle        Equipment Recommendations Rolling walker (2 wheels)  Recommendations for Other Services       Functional Status Assessment Patient has had a recent decline in their functional status and demonstrates the ability to make significant improvements in function in a reasonable and predictable amount of time.     Precautions / Restrictions Precautions Precautions: Fall Precaution/Restrictions Comments: watch SpO2      Mobility  Bed  Mobility Overal bed mobility: Needs Assistance Bed Mobility: Supine to Sit     Supine to sit: Mod assist, HOB elevated, Used rails     General bed mobility comments: increased time, moaning in pain on R side and help to lift trunk and scoot hips, pt soiled with urine in bed and not aware    Transfers Overall transfer level: Needs assistance Equipment used: 1 person hand held assist Transfers: Sit to/from Stand Sit to Stand: Min assist           General transfer comment: up from EOB then from toilet in bathroom with A for balance    Ambulation/Gait Ambulation/Gait assistance: Min assist Gait Distance (Feet): 20 Feet Assistive device: None Gait Pattern/deviations: Step-through pattern, Decreased stride length, Step-to pattern       General Gait Details: walking from bathroom around bed to recliner, with A for balance mild instability noted.  Stairs            Wheelchair Mobility     Tilt Bed    Modified Rankin (Stroke Patients Only)       Balance Overall balance assessment: Needs assistance   Sitting balance-Leahy Scale: Good   Postural control: Posterior lean Standing balance support: No upper extremity supported, During functional activity Standing balance-Leahy Scale: Poor Standing balance comment: posterior LOB standing at sink to wash hands, min A for balance throughout                             Pertinent Vitals/Pain Pain Assessment Pain Assessment: Faces Faces Pain Scale: Hurts whole lot Pain  Location: R side with bed mobility Pain Descriptors / Indicators: Aching, Guarding, Discomfort, Moaning Pain Intervention(s): Monitored during session, Repositioned, Limited activity within patient's tolerance    Home Living Family/patient expects to be discharged to:: Private residence Living Arrangements: Children Available Help at Discharge: Family Type of Home: House Home Access: (P) Stairs to enter   Entrance Stairs-Number of  Steps: (P) 3   Home Layout: (P) Multi-level;Able to live on main level with bedroom/bathroom Home Equipment: BSC/3in1 Additional Comments: states staying with her daughter since toilet "blew up" brother had left her a house, he passed a few weeks ago, difficult to ascertain exact history    Prior Function Prior Level of Function : Patient poor historian/Family not available;Independent/Modified Independent                     Extremity/Trunk Assessment   Upper Extremity Assessment Upper Extremity Assessment: Defer to OT evaluation    Lower Extremity Assessment Lower Extremity Assessment: Generalized weakness    Cervical / Trunk Assessment Cervical / Trunk Assessment: Kyphotic  Communication   Communication Communication: No apparent difficulties    Cognition Arousal: Alert Behavior During Therapy: WFL for tasks assessed/performed   PT - Cognitive impairments: No family/caregiver present to determine baseline, Orientation, Problem solving, Attention   Orientation impairments: Time, Situation                   PT - Cognition Comments: stated "2005" initially, states here not due to breathing issues; hyperverbal though slow to respond to orientation questions and to cues, sustained attention when in less distracting environment Following commands: Impaired Following commands impaired: Follows one step commands with increased time     Cueing Cueing Techniques: Verbal cues     General Comments General comments (skin integrity, edema, etc.): Difficulty obtaining SpO2 with nails colored and thick, finally reading some on thumb with color removed reading 91-95% on RA at rest    Exercises     Assessment/Plan    PT Assessment Patient needs continued PT services  PT Problem List Decreased balance;Decreased knowledge of use of DME;Decreased strength;Decreased mobility;Decreased safety awareness;Decreased activity tolerance       PT Treatment Interventions DME  instruction;Therapeutic exercise;Balance training;Gait training;Stair training;Functional mobility training;Therapeutic activities;Patient/family education    PT Goals (Current goals can be found in the Care Plan section)  Acute Rehab PT Goals Patient Stated Goal: go home Time For Goal Achievement: 09/02/23 Potential to Achieve Goals: Good    Frequency Min 2X/week     Co-evaluation PT/OT/SLP Co-Evaluation/Treatment: Yes Reason for Co-Treatment: Other (comment) (pain tolerance) PT goals addressed during session: Mobility/safety with mobility;Balance OT goals addressed during session: ADL's and self-care       AM-PAC PT "6 Clicks" Mobility  Outcome Measure Help needed turning from your back to your side while in a flat bed without using bedrails?: A Little Help needed moving from lying on your back to sitting on the side of a flat bed without using bedrails?: A Lot Help needed moving to and from a bed to a chair (including a wheelchair)?: A Little Help needed standing up from a chair using your arms (e.g., wheelchair or bedside chair)?: A Little Help needed to walk in hospital room?: A Little Help needed climbing 3-5 steps with a railing? : Total 6 Click Score: 15    End of Session Equipment Utilized During Treatment: Gait belt Activity Tolerance: Patient limited by pain Patient left: in chair;with call bell/phone within reach;with chair alarm set  PT Visit Diagnosis: Other abnormalities of gait and mobility (R26.89);Other symptoms and signs involving the nervous system (R29.898);Muscle weakness (generalized) (M62.81)    Time: 1140-1155 PT Time Calculation (min) (ACUTE ONLY): 15 min   Charges:   PT Evaluation $PT Eval Moderate Complexity: 1 Mod   PT General Charges $$ ACUTE PT VISIT: 1 Visit         Abigail Hoff, PT Acute Rehabilitation Services [phone] 08/19/2023   Marley Simmers 08/19/2023, 1:32 PM

## 2023-08-19 NOTE — Progress Notes (Signed)
 PROGRESS NOTE  Veronica Wolf    DOB: Jul 12, 1940, 83 y.o.  AVW:[id number]    Code Status: Full Code   DOA: 08/18/2023   LOS: 1   Brief hospital course  Veronica Wolf is a 83 y.o. female with medical history significant for prior NSTEMI, COPD, not on oxygen supplementation at baseline, CKD 3B, hypertension, hyperlipidemia, GERD, chronic anxiety/depression, obesity, who initially presented to her primary care provider due to shortness of breath x 1 week.    ED Course: Temperature 98.4.  BP 145/62, pulse of 64, respiratory rate 24, O2 saturation 100% on 2 L.  Lab studies notable for WBC 13.7, hemoglobin 10.8, platelet count 285.  Sodium 126, potassium 3.4, glucose 108, creatinine 1.20 with GFR 44, proBNP 3241.  Magnesium 1.7.  Procalcitonin less than 0.10. A CT angio chest showed no evidence of pulmonary embolus however revealed left lung patchy infiltrates concerning for pneumonia.   In the ED, the patient's O2 saturation was improved on 2 L nasal cannula to the 90s.  Additionally, she received 1 dose of IV Lasix  20 mg x 1 with improvement of her symptomatology. Started on Rocephin and IV azithromycin  empirically for CAP.  08/19/23 -wean to room air. Echo pending  Assessment & Plan  Principal Problem:   Acute hypoxemic respiratory failure (HCC)  Acute hypoxic respiratory failure secondary to multifactorial- Left lower lobe pneumonia, CAP, seen on CT scan, POA Acute on chronic HFpEF- Last 2D echo done in 2019 revealed LVEF 55 to 60% with grade 1 diastolic dysfunction with some mild hypokinesis of the mid apical lateral and inferolateral myocardium. Continue Rocephin, IV azithromycin , and IV Lasix  20 mg Ambulate with pulse ox Incentive spirometer Bronchodilators Monitor strict I's and O's and daily weight Follow repeat 2D echo PT/OT   Hyperlipidemia Resume home Lipitor  and Zetia    Type 2 diabetes with hyperglycemia. Last hemoglobin A1c 6.8 on 04/22/2023 Heart healthy carb modified  diet Insulin sliding scale.   Hypothyroidism Resume home levothyroxine    Chronic anxiety Resume home regimen.   Obesity BMI 33 Recommend weight loss outpatient with regular physical activity and healthy dieting.   Generalized weakness PT OT evaluation Fall precautions   CKD 3B At baseline Avoid nephrotoxic agents and hypotension Monitor urine output.   Hypertension BPs are stable Resume home oral antihypertensives as blood pressure allows Monitor vital signs  Body mass index is 32.92 kg/m.  VTE ppx: enoxaparin (LOVENOX) injection 40 mg Start: 08/19/23 1000  Diet:     Diet   Diet Heart Room service appropriate? Yes; Fluid consistency: Thin   Consultants: None   Subjective 08/19/23    Pt reports feeling well. Denies chest pain or SOB.    Objective   Vitals:   08/18/23 1921 08/18/23 1924 08/19/23 0131 08/19/23 0258  BP:  136/77 (!) 145/62 (!) 154/59  Pulse:   64 69  Resp: 20 20 20 20   Temp:  98.4 F (36.9 C) 98.1 F (36.7 C) 98.3 F (36.8 C)  TempSrc:  Oral Axillary Oral  SpO2:  96% 100%   Weight:    87 kg  Height:        Intake/Output Summary (Last 24 hours) at 08/19/2023 0738 Last data filed at 08/19/2023 0356 Gross per 24 hour  Intake --  Output 1100 ml  Net -1100 ml   Filed Weights   08/18/23 1420 08/18/23 1800 08/19/23 0258  Weight: 88.9 kg 88.9 kg 87 kg     Physical Exam:  General: awake, alert, NAD HEENT:  atraumatic, clear conjunctiva, anicteric sclera, MMM, hearing grossly normal Respiratory: normal respiratory effort.inspiratory wheezing Cardiovascular: quick capillary refill, normal S1/S2, RRR, no JVD, murmurs Nervous: A&O x3. no gross focal neurologic deficits, normal speech Extremities: moves all equally, no edema, normal tone Skin: dry, intact, normal temperature, normal color. No rashes, lesions or ulcers on exposed skin Psychiatry: normal mood, congruent affect  Labs   I have personally reviewed the following labs and  imaging studies CBC    Component Value Date/Time   WBC 13.7 (H) 08/18/2023 1041   RBC 3.76 (L) 08/18/2023 1041   HGB 10.8 (L) 08/18/2023 1041   HGB 12.6 01/21/2022 1158   HCT 34.8 (L) 08/18/2023 1041   HCT 39.0 01/21/2022 1158   PLT 285 08/18/2023 1041   PLT 234 01/21/2022 1158   MCV 92.6 08/18/2023 1041   MCV 89 01/21/2022 1158   MCH 28.7 08/18/2023 1041   MCHC 31.0 08/18/2023 1041   RDW 16.8 (H) 08/18/2023 1041   RDW 14.1 01/21/2022 1158   LYMPHSABS 3.0 09/08/2022 0953   MONOABS 0.6 09/08/2022 0953   EOSABS 0.2 09/08/2022 0953   BASOSABS 0.1 09/08/2022 0953      Latest Ref Rng & Units 08/18/2023   10:58 AM 04/22/2023   12:32 PM 02/24/2023    3:40 PM  BMP  Glucose 70 - 99 mg/dL 161  78  096   BUN 8 - 23 mg/dL 12  11  13    Creatinine 0.44 - 1.00 mg/dL 0.45  4.09  8.11   Sodium 135 - 145 mmol/L 146  142  140   Potassium 3.5 - 5.1 mmol/L 3.4  4.4  4.1   Chloride 98 - 111 mmol/L 109  106  105   CO2 22 - 32 mmol/L 26  29  28    Calcium  8.9 - 10.3 mg/dL 8.6  8.9  9.4     CT Angio Chest PE W and/or Wo Contrast Result Date: 08/18/2023 CLINICAL DATA:  Pulmonary embolism (PE) suspected, low to intermediate prob, positive D-dimer. Shortness of breath EXAM: CT ANGIOGRAPHY CHEST WITH CONTRAST TECHNIQUE: Multidetector CT imaging of the chest was performed using the standard protocol during bolus administration of intravenous contrast. Multiplanar CT image reconstructions and MIPs were obtained to evaluate the vascular anatomy. RADIATION DOSE REDUCTION: This exam was performed according to the departmental dose-optimization program which includes automated exposure control, adjustment of the mA and/or kV according to patient size and/or use of iterative reconstruction technique. CONTRAST:  75mL OMNIPAQUE  IOHEXOL  350 MG/ML SOLN COMPARISON:  Chest x-ray today. FINDINGS: Cardiovascular: Heart is normal size. Aorta is normal caliber. Scattered aortic atherosclerosis. Mediastinum/Nodes: No  mediastinal, hilar, or axillary adenopathy. Trachea and esophagus are unremarkable. Thyroid  unremarkable. Lungs/Pleura: Moderate emphysema. Patchy airspace disease throughout the left lung concerning for pneumonia. No confluent opacity on the right. Atelectasis or scarring in the right middle lobe. No effusions. Upper Abdomen: No acute findings Musculoskeletal: Chest wall soft tissues are unremarkable. No acute bony abnormality. Review of the MIP images confirms the above findings. IMPRESSION: No evidence of pulmonary embolus. Patchy airspace disease throughout the left lung concerning for pneumonia. Aortic Atherosclerosis (ICD10-I70.0) and Emphysema (ICD10-J43.9). Electronically Signed   By: Janeece Mechanic M.D.   On: 08/18/2023 22:27   DG Chest 2 View Result Date: 08/18/2023 CLINICAL DATA:  shortness of breath EXAM: CHEST - 2 VIEW COMPARISON:  March 19, 2022 FINDINGS: Fine reticular, bilateral interstitial opacities. No focal airspace consolidation, pneumothorax, or pleural effusion. No cardiomegaly. Tortuous aorta. No acute fracture  or destructive lesions. Multilevel degenerative disc disease of the spine. IMPRESSION: Fine, reticular bilateral interstitial opacities, which may reflect changes of an atypical/viral infection or interstitial edema. Laboratory correlation recommended. Electronically Signed   By: Rance Burrows M.D.   On: 08/18/2023 12:07    Disposition Plan & Communication  Patient status: Inpatient  Admitted From: Home Planned disposition location: Home Anticipated discharge date: 5/17 pending clinical workup   Family Communication: none at bedside    Author: Ree Candy, DO [facility] 08/19/2023, 7:38 AM   Available by Epic secure chat 7AM-7PM. If 7PM-7AM, please contact night-coverage.  [facility] contact information found on [email or web address].

## 2023-08-20 DIAGNOSIS — J9601 Acute respiratory failure with hypoxia: Secondary | ICD-10-CM | POA: Diagnosis not present

## 2023-08-20 LAB — COMPREHENSIVE METABOLIC PANEL WITH GFR
ALT: 12 U/L (ref 0–44)
AST: 19 U/L (ref 15–41)
Albumin: 2.4 g/dL — ABNORMAL LOW (ref 3.5–5.0)
Alkaline Phosphatase: 51 U/L (ref 38–126)
Anion gap: 9 (ref 5–15)
BUN: 11 mg/dL (ref 8–23)
CO2: 28 mmol/L (ref 22–32)
Calcium: 8.1 mg/dL — ABNORMAL LOW (ref 8.9–10.3)
Chloride: 103 mmol/L (ref 98–111)
Creatinine, Ser: 1.42 mg/dL — ABNORMAL HIGH (ref 0.44–1.00)
GFR, Estimated: 36 mL/min — ABNORMAL LOW (ref >60)
Glucose, Bld: 155 mg/dL — ABNORMAL HIGH (ref 70–99)
Potassium: 3.8 mmol/L (ref 3.5–5.1)
Sodium: 140 mmol/L (ref 135–145)
Total Bilirubin: 0.5 mg/dL (ref 0.0–1.2)
Total Protein: 6.9 g/dL (ref 6.5–8.1)

## 2023-08-20 LAB — GLUCOSE, CAPILLARY
Glucose-Capillary: 93 mg/dL (ref 70–99)
Glucose-Capillary: 140 mg/dL — ABNORMAL HIGH (ref 70–99)
Glucose-Capillary: 103 mg/dL — ABNORMAL HIGH (ref 70–99)
Glucose-Capillary: 118 mg/dL — ABNORMAL HIGH (ref 70–99)

## 2023-08-20 LAB — CBC
HCT: 35.6 % — ABNORMAL LOW (ref 36.0–46.0)
Hemoglobin: 10.9 g/dL — ABNORMAL LOW (ref 12.0–15.0)
MCH: 28.5 pg (ref 26.0–34.0)
MCHC: 30.6 g/dL (ref 30.0–36.0)
MCV: 93.2 fL (ref 80.0–100.0)
Platelets: 287 10*3/uL (ref 150–400)
RBC: 3.82 MIL/uL — ABNORMAL LOW (ref 3.87–5.11)
RDW: 16.8 % — ABNORMAL HIGH (ref 11.5–15.5)
WBC: 11 10*3/uL — ABNORMAL HIGH (ref 4.0–10.5)
nRBC: 0 % (ref 0.0–0.2)

## 2023-08-20 LAB — BRAIN NATRIURETIC PEPTIDE: B Natriuretic Peptide: 35.4 pg/mL (ref 0.0–100.0)

## 2023-08-20 MED ORDER — FUROSEMIDE 10 MG/ML IJ SOLN
40.0000 mg | Freq: Every day | INTRAMUSCULAR | Status: DC
  Administered 2023-08-20 – 2023-08-21 (×2): 40 mg via INTRAVENOUS
  Filled 2023-08-20 (×2): qty 4

## 2023-08-20 MED ORDER — AZITHROMYCIN 250 MG PO TABS
500.0000 mg | ORAL_TABLET | Freq: Every day | ORAL | Status: DC
  Administered 2023-08-20 – 2023-08-21 (×2): 500 mg via ORAL
  Filled 2023-08-20 (×2): qty 2

## 2023-08-20 NOTE — Plan of Care (Signed)
 Patient on and off 02 to maintain 02 sat greater than 90-92%. Patient now on 1-2 liters. Patient has doe and occasional wheezing.

## 2023-08-20 NOTE — Progress Notes (Signed)
 PROGRESS NOTE    Veronica Wolf  ZOX:[id number] DOB: October 18, 1940 DOA: 08/18/2023 PCP: Dorrene Gaucher, NP  Chief Complaint  Patient presents with   Shortness of Breath    Brief Narrative:   Veronica Wolf is Veronica Wolf 83 y.o. female with medical history significant for prior NSTEMI, COPD, not on oxygen supplementation at baseline, CKD 3B, hypertension, hyperlipidemia, GERD, chronic anxiety/depression, obesity, who initially presented to her primary care provider due to shortness of breath x 1 week.   Imaging concerning for pneumonia, also being treated for HFpEF.  Assessment & Plan:   Principal Problem:   Acute hypoxemic respiratory failure (HCC)  Acute Hypoxemic Respiratory Failure Community Acquired Pneumonia HFpEF with Exacerbation  CT PE protocol with patchy airspace disease throughout the left lung concerning for pneumonia Echo with EF 65-70%, no RWMA, grade 1 diastolic dysfunction - IVC dilated with <50% resp variability  proBNP 3241 - clinically doesn't appear overloaded, but with elevated BNP and dilated IVC will diurese as tolerated Ceftriaxone , azithromycin  Continue diuresis as tolerated Strict I/O, daily weights  Hypertension BP on low side, hold lisinopril  Continue metop Watch with diuresis   CKD 3B Baseline creatinine 1.4-1.5 Currently around baseline Follow with diuresis   Type 2 diabetes with hyperglycemia. Last hemoglobin A1c 6.8 on 04/22/2023 Heart healthy carb modified diet Insulin  sliding scale.   Hypothyroidism levothyroxine    Chronic anxiety Valium  prn  lexapro    Obesity BMI 33 Recommend weight loss outpatient with regular physical activity and healthy dieting.   Generalized weakness PT OT evaluation Fall precautions   Hyperlipidemia Lipitor  and Zetia   Obesity Body mass index is 33.11 kg/m.     DVT prophylaxis: lovenox  Code Status: full Family Communication: none Disposition:   Status is: Inpatient Remains inpatient appropriate  because: need for ongoing inpatient care   Consultants:  none  Procedures:  Echo IMPRESSIONS     1. Left ventricular ejection fraction, by estimation, is 65 to 70%. The  left ventricle has normal function. The left ventricle has no regional  wall motion abnormalities. There is mild concentric left ventricular  hypertrophy. Left ventricular diastolic  parameters are consistent with Grade I diastolic dysfunction (impaired  relaxation).   2. Right ventricular systolic function is normal. The right ventricular  size is normal. Tricuspid regurgitation signal is inadequate for assessing  PA pressure.   3. The mitral valve is normal in structure. Trivial mitral valve  regurgitation. No evidence of mitral stenosis.   4. The aortic valve was not well visualized. Aortic valve regurgitation  is not visualized. No aortic stenosis is present.   5. The inferior vena cava is dilated in size with <50% respiratory  variability, suggesting right atrial pressure of 15 mmHg.   Antimicrobials:  Anti-infectives (From admission, onward)    Start     Dose/Rate Route Frequency Ordered Stop   08/20/23 2200  azithromycin  (ZITHROMAX ) tablet 500 mg        500 mg Oral Daily at bedtime 08/20/23 1334     08/19/23 0230  cefTRIAXone  (ROCEPHIN ) 2 g in sodium chloride  0.9 % 100 mL IVPB        2 g 200 mL/hr over 30 Minutes Intravenous Daily at bedtime 08/19/23 0221     08/19/23 0230  azithromycin  (ZITHROMAX ) 500 mg in sodium chloride  0.9 % 250 mL IVPB  Status:  Discontinued        500 mg 250 mL/hr over 60 Minutes Intravenous Daily at bedtime 08/19/23 0221 08/20/23 1334  Subjective: Nausea, otherwise no complaints  Objective: Vitals:   08/20/23 0443 08/20/23 0742 08/20/23 0752 08/20/23 1107  BP: 111/63  99/68 (!) 111/51  Pulse: 74  69 80  Resp: 20  20 20   Temp: 98.1 F (36.7 C)  98 F (36.7 C) 98 F (36.7 C)  TempSrc: Oral  Oral Oral  SpO2: 90% 97% 100% 98%  Weight: 87.5 kg     Height:         Intake/Output Summary (Last 24 hours) at 08/20/2023 1346 Last data filed at 08/20/2023 1234 Gross per 24 hour  Intake 680 ml  Output 200 ml  Net 480 ml   Filed Weights   08/18/23 1800 08/19/23 0258 08/20/23 0443  Weight: 88.9 kg 87 kg 87.5 kg    Examination:  General exam: Appears calm and comfortable  Respiratory system: unlabored Cardiovascular system: RRR Gastrointestinal system: Abdomen is nondistended, soft and nontender. Central nervous system: Alert and oriented. No focal neurological deficits. Extremities: no LEE   Data Reviewed: I have personally reviewed following labs and imaging studies  CBC: Recent Labs  Lab 08/18/23 1041 08/19/23 1029 08/20/23 1027  WBC 13.7* 15.0* 11.0*  HGB 10.8* 11.0* 10.9*  HCT 34.8* 34.2* 35.6*  MCV 92.6 91.9 93.2  PLT 285 251 287    Basic Metabolic Panel: Recent Labs  Lab 08/18/23 1058 08/18/23 1332 08/19/23 1029 08/20/23 1027  NA 146*  --  141 140  K 3.4*  --  3.4* 3.8  CL 109  --  103 103  CO2 26  --  26 28  GLUCOSE 108*  --  116* 155*  BUN 12  --  12 11  CREATININE 1.20*  --  1.31* 1.42*  CALCIUM  8.6*  --  8.4* 8.1*  MG  --  1.7 1.6*  --   PHOS  --   --  2.0*  --     GFR: Estimated Creatinine Clearance: 31.6 mL/min (Orrie Lascano) (by C-G formula based on SCr of 1.42 mg/dL (H)).  Liver Function Tests: Recent Labs  Lab 08/18/23 1058 08/20/23 1027  AST 16 19  ALT 9 12  ALKPHOS 72 51  BILITOT <0.2 0.5  PROT 6.9 6.9  ALBUMIN 3.3* 2.4*    CBG: Recent Labs  Lab 08/19/23 1114 08/19/23 1535 08/19/23 2043 08/20/23 0608 08/20/23 1108  GLUCAP 110* 113* 93 93 140*     Recent Results (from the past 240 hours)  Resp panel by RT-PCR (RSV, Flu Lamarkus Nebel&B, Covid) Anterior Nasal Swab     Status: None   Collection Time: 08/18/23 11:00 AM   Specimen: Anterior Nasal Swab  Result Value Ref Range Status   SARS Coronavirus 2 by RT PCR NEGATIVE NEGATIVE Final    Comment: (NOTE) SARS-CoV-2 target nucleic acids are NOT  DETECTED.  The SARS-CoV-2 RNA is generally detectable in upper respiratory specimens during the acute phase of infection. The lowest concentration of SARS-CoV-2 viral copies this assay can detect is 138 copies/mL. Jimmi Sidener negative result does not preclude SARS-Cov-2 infection and should not be used as the sole basis for treatment or other patient management decisions. Verina Galeno negative result may occur with  improper specimen collection/handling, submission of specimen other than nasopharyngeal swab, presence of viral mutation(s) within the areas targeted by this assay, and inadequate number of viral copies(<138 copies/mL). Robby Pirani negative result must be combined with clinical observations, patient history, and epidemiological information. The expected result is Negative.  Fact Sheet for Patients:  BloggerCourse.com  Fact Sheet for Healthcare Providers:  SeriousBroker.it  This test is no t yet approved or cleared by the [place]  FDA and  has been authorized for detection and/or diagnosis of SARS-CoV-2 by FDA under an Emergency Use Authorization (EUA). This EUA will remain  in effect (meaning this test can be used) for the duration of the COVID-19 declaration under Section 564(b)(1) of the Act, 21 U.S.C.section 360bbb-3(b)(1), unless the authorization is terminated  or revoked sooner.       Influenza Louvenia Golomb by PCR NEGATIVE NEGATIVE Final   Influenza B by PCR NEGATIVE NEGATIVE Final    Comment: (NOTE) The Xpert Xpress SARS-CoV-2/FLU/RSV plus assay is intended as an aid in the diagnosis of influenza from Nasopharyngeal swab specimens and should not be used as Bryannah Boston sole basis for treatment. Nasal washings and aspirates are unacceptable for Xpert Xpress SARS-CoV-2/FLU/RSV testing.  Fact Sheet for Patients: BloggerCourse.com  Fact Sheet for Healthcare Providers: SeriousBroker.it  This test is not yet  approved or cleared by the [place]  FDA and has been authorized for detection and/or diagnosis of SARS-CoV-2 by FDA under an Emergency Use Authorization (EUA). This EUA will remain in effect (meaning this test can be used) for the duration of the COVID-19 declaration under Section 564(b)(1) of the Act, 21 U.S.C. section 360bbb-3(b)(1), unless the authorization is terminated or revoked.     Resp Syncytial Virus by PCR NEGATIVE NEGATIVE Final    Comment: (NOTE) Fact Sheet for Patients: BloggerCourse.com  Fact Sheet for Healthcare Providers: SeriousBroker.it  This test is not yet approved or cleared by the [place]  FDA and has been authorized for detection and/or diagnosis of SARS-CoV-2 by FDA under an Emergency Use Authorization (EUA). This EUA will remain in effect (meaning this test can be used) for the duration of the COVID-19 declaration under Section 564(b)(1) of the Act, 21 U.S.C. section 360bbb-3(b)(1), unless the authorization is terminated or revoked.  Performed at [facility], [address] [place], [place] [address]          Radiology Studies: ECHOCARDIOGRAM COMPLETE Result Date: 08/19/2023    ECHOCARDIOGRAM REPORT   Patient Name:   LADELLE TEODORO Date of Exam: 08/19/2023 Medical Rec #:  [id number]     Height:       64.0 in Accession #:    [id number]    Weight:       191.8 lb Date of Birth:  02-12-1941     BSA:          1.922 m Patient Age:    83 years      BP:           131/71 mmHg Patient Gender: F             HR:           78 bpm. Exam Location:  Inpatient Procedure: 2D Echo, Cardiac Doppler and Color Doppler (Both Spectral and Color            Flow Doppler were utilized during procedure). Indications:    CHF  History:        Patient has prior history of Echocardiogram examinations, most                 recent 12/23/2016. Previous Myocardial Infarction, COPD; Risk                 Factors:Hypertension.   Sonographer:    Astrid Blamer Referring Phys: [id number] CAROLE N HALL IMPRESSIONS  1. Left ventricular ejection fraction, by estimation, is 65  to 70%. The left ventricle has normal function. The left ventricle has no regional wall motion abnormalities. There is mild concentric left ventricular hypertrophy. Left ventricular diastolic parameters are consistent with Grade I diastolic dysfunction (impaired relaxation).  2. Right ventricular systolic function is normal. The right ventricular size is normal. Tricuspid regurgitation signal is inadequate for assessing PA pressure.  3. The mitral valve is normal in structure. Trivial mitral valve regurgitation. No evidence of mitral stenosis.  4. The aortic valve was not well visualized. Aortic valve regurgitation is not visualized. No aortic stenosis is present.  5. The inferior vena cava is dilated in size with <50% respiratory variability, suggesting right atrial pressure of 15 mmHg. FINDINGS  Left Ventricle: Left ventricular ejection fraction, by estimation, is 65 to 70%. The left ventricle has normal function. The left ventricle has no regional wall motion abnormalities. The left ventricular internal cavity size was normal in size. There is  mild concentric left ventricular hypertrophy. Left ventricular diastolic parameters are consistent with Grade I diastolic dysfunction (impaired relaxation). Right Ventricle: The right ventricular size is normal. No increase in right ventricular wall thickness. Right ventricular systolic function is normal. Tricuspid regurgitation signal is inadequate for assessing PA pressure. Left Atrium: Left atrial size was normal in size. Right Atrium: Right atrial size was normal in size. Pericardium: Trivial pericardial effusion is present. Mitral Valve: The mitral valve is normal in structure. Trivial mitral valve regurgitation. No evidence of mitral valve stenosis. Tricuspid Valve: The tricuspid valve is normal in structure. Tricuspid valve  regurgitation is not demonstrated. Aortic Valve: The aortic valve was not well visualized. Aortic valve regurgitation is not visualized. No aortic stenosis is present. Aortic valve mean gradient measures 5.0 mmHg. Aortic valve peak gradient measures 6.7 mmHg. Aortic valve area, by VTI measures 2.34 cm. Pulmonic Valve: The pulmonic valve was normal in structure. Pulmonic valve regurgitation is not visualized. Aorta: The aortic root is normal in size and structure. Venous: The inferior vena cava is dilated in size with less than 50% respiratory variability, suggesting right atrial pressure of 15 mmHg. IAS/Shunts: No atrial level shunt detected by color flow Doppler.  LEFT VENTRICLE PLAX 2D LVIDd:         4.20 cm   Diastology LVIDs:         2.10 cm   LV e' medial:    6.96 cm/s LV PW:         0.80 cm   LV E/e' medial:  15.5 LV IVS:        1.00 cm   LV e' lateral:   9.90 cm/s LVOT diam:     1.80 cm   LV E/e' lateral: 10.9 LV SV:         68 LV SV Index:   35 LVOT Area:     2.54 cm  RIGHT VENTRICLE RV S prime:     14.90 cm/s TAPSE (M-mode): 1.6 cm LEFT ATRIUM             Index        RIGHT ATRIUM           Index LA Vol (A2C):   22.6 ml 11.76 ml/m  RA Area:     14.10 cm LA Vol (A4C):   36.1 ml 18.78 ml/m  RA Volume:   37.50 ml  19.51 ml/m LA Biplane Vol: 29.9 ml 15.56 ml/m  AORTIC VALVE AV Area (Vmax):    2.15 cm AV Area (Vmean):   2.07 cm AV Area (  VTI):     2.34 cm AV Vmax:           129.00 cm/s AV Vmean:          106.000 cm/s AV VTI:            0.289 m AV Peak Grad:      6.7 mmHg AV Mean Grad:      5.0 mmHg LVOT Vmax:         109.00 cm/s LVOT Vmean:        86.200 cm/s LVOT VTI:          0.266 m LVOT/AV VTI ratio: 0.92 MITRAL VALVE MV Area (PHT): 4.39 cm     SHUNTS MV Decel Time: 173 msec     Systemic VTI:  0.27 m MV E velocity: 108.00 cm/s  Systemic Diam: 1.80 cm MV Baron Parmelee velocity: 106.00 cm/s MV E/Kylen Schliep ratio:  1.02 Dalton McleanMD Electronically signed by Archer Bear Signature Date/Time: 08/19/2023/1:04:04 PM     Final    CT Angio Chest PE W and/or Wo Contrast Result Date: 08/18/2023 CLINICAL DATA:  Pulmonary embolism (PE) suspected, low to intermediate prob, positive D-dimer. Shortness of breath EXAM: CT ANGIOGRAPHY CHEST WITH CONTRAST TECHNIQUE: Multidetector CT imaging of the chest was performed using the standard protocol during bolus administration of intravenous contrast. Multiplanar CT image reconstructions and MIPs were obtained to evaluate the vascular anatomy. RADIATION DOSE REDUCTION: This exam was performed according to the departmental dose-optimization program which includes automated exposure control, adjustment of the mA and/or kV according to patient size and/or use of iterative reconstruction technique. CONTRAST:  75mL OMNIPAQUE  IOHEXOL  350 MG/ML SOLN COMPARISON:  Chest x-ray today. FINDINGS: Cardiovascular: Heart is normal size. Aorta is normal caliber. Scattered aortic atherosclerosis. Mediastinum/Nodes: No mediastinal, hilar, or axillary adenopathy. Trachea and esophagus are unremarkable. Thyroid  unremarkable. Lungs/Pleura: Moderate emphysema. Patchy airspace disease throughout the left lung concerning for pneumonia. No confluent opacity on the right. Atelectasis or scarring in the right middle lobe. No effusions. Upper Abdomen: No acute findings Musculoskeletal: Chest wall soft tissues are unremarkable. No acute bony abnormality. Review of the MIP images confirms the above findings. IMPRESSION: No evidence of pulmonary embolus. Patchy airspace disease throughout the left lung concerning for pneumonia. Aortic Atherosclerosis (ICD10-I70.0) and Emphysema (ICD10-J43.9). Electronically Signed   By: Janeece Mechanic M.D.   On: 08/18/2023 22:27        Scheduled Meds:  aspirin  EC  81 mg Oral Daily   atorvastatin   80 mg Oral q1800   azithromycin   500 mg Oral QHS   enoxaparin  (LOVENOX ) injection  40 mg Subcutaneous Daily   escitalopram   20 mg Oral Q lunch   ezetimibe   10 mg Oral Q lunch   insulin   aspart  0-9 Units Subcutaneous TID WC   ipratropium-albuterol   3 mL Nebulization Q6H   levothyroxine   25 mcg Oral Q0600   metoprolol  succinate  12.5 mg Oral QPM   potassium chloride  SA  20 mEq Oral Q1200   sodium chloride  flush  10-40 mL Intracatheter Q12H   Continuous Infusions:  cefTRIAXone  (ROCEPHIN )  IV 2 g (08/19/23 2131)     LOS: 2 days    Time spent: over 30 min    Donnetta Gains, MD [facility]   To contact the attending provider between 7A-7P or the covering provider during after hours 7P-7A, please log into the web site [email or web address] and access using universal [place] password for that web site. If you do not have the password, please call  the hospital operator.  08/20/2023, 1:46 PM

## 2023-08-21 DIAGNOSIS — J9601 Acute respiratory failure with hypoxia: Secondary | ICD-10-CM | POA: Diagnosis not present

## 2023-08-21 LAB — COMPREHENSIVE METABOLIC PANEL WITH GFR
ALT: 11 U/L (ref 0–44)
AST: 18 U/L (ref 15–41)
Albumin: 2.4 g/dL — ABNORMAL LOW (ref 3.5–5.0)
Alkaline Phosphatase: 47 U/L (ref 38–126)
Anion gap: 10 (ref 5–15)
BUN: 12 mg/dL (ref 8–23)
CO2: 27 mmol/L (ref 22–32)
Calcium: 8.3 mg/dL — ABNORMAL LOW (ref 8.9–10.3)
Chloride: 102 mmol/L (ref 98–111)
Creatinine, Ser: 1.37 mg/dL — ABNORMAL HIGH (ref 0.44–1.00)
GFR, Estimated: 38 mL/min — ABNORMAL LOW (ref >60)
Glucose, Bld: 101 mg/dL — ABNORMAL HIGH (ref 70–99)
Potassium: 4.7 mmol/L (ref 3.5–5.1)
Sodium: 139 mmol/L (ref 135–145)
Total Bilirubin: 0.3 mg/dL (ref 0.0–1.2)
Total Protein: 6.4 g/dL — ABNORMAL LOW (ref 6.5–8.1)

## 2023-08-21 LAB — GLUCOSE, CAPILLARY
Glucose-Capillary: 94 mg/dL (ref 70–99)
Glucose-Capillary: 111 mg/dL — ABNORMAL HIGH (ref 70–99)
Glucose-Capillary: 115 mg/dL — ABNORMAL HIGH (ref 70–99)
Glucose-Capillary: 113 mg/dL — ABNORMAL HIGH (ref 70–99)

## 2023-08-21 LAB — CBC WITH DIFFERENTIAL/PLATELET
Abs Immature Granulocytes: 0.08 10*3/uL — ABNORMAL HIGH (ref 0.00–0.07)
Basophils Absolute: 0.1 10*3/uL (ref 0.0–0.1)
Basophils Relative: 1 %
Eosinophils Absolute: 0.5 10*3/uL (ref 0.0–0.5)
Eosinophils Relative: 4 %
HCT: 33.3 % — ABNORMAL LOW (ref 36.0–46.0)
Hemoglobin: 10.6 g/dL — ABNORMAL LOW (ref 12.0–15.0)
Immature Granulocytes: 1 %
Lymphocytes Relative: 24 %
Lymphs Abs: 2.4 10*3/uL (ref 0.7–4.0)
MCH: 29 pg (ref 26.0–34.0)
MCHC: 31.8 g/dL (ref 30.0–36.0)
MCV: 91.2 fL (ref 80.0–100.0)
Monocytes Absolute: 0.7 10*3/uL (ref 0.1–1.0)
Monocytes Relative: 6 %
Neutro Abs: 6.7 10*3/uL (ref 1.7–7.7)
Neutrophils Relative %: 64 %
Platelets: 273 10*3/uL (ref 150–400)
RBC: 3.65 MIL/uL — ABNORMAL LOW (ref 3.87–5.11)
RDW: 16.6 % — ABNORMAL HIGH (ref 11.5–15.5)
WBC: 10.4 10*3/uL (ref 4.0–10.5)
nRBC: 0 % (ref 0.0–0.2)

## 2023-08-21 LAB — PHOSPHORUS: Phosphorus: 4.4 mg/dL (ref 2.5–4.6)

## 2023-08-21 LAB — MAGNESIUM: Magnesium: 1.8 mg/dL (ref 1.7–2.4)

## 2023-08-21 LAB — BRAIN NATRIURETIC PEPTIDE: B Natriuretic Peptide: 20.8 pg/mL (ref 0.0–100.0)

## 2023-08-21 MED ORDER — IPRATROPIUM-ALBUTEROL 0.5-2.5 (3) MG/3ML IN SOLN
3.0000 mL | Freq: Two times a day (BID) | RESPIRATORY_TRACT | Status: DC
  Administered 2023-08-21 – 2023-08-22 (×2): 3 mL via RESPIRATORY_TRACT
  Filled 2023-08-21 (×2): qty 3

## 2023-08-21 NOTE — Progress Notes (Signed)
 Patient told RN and charge nurse when we were pulling her up in bed this morning that she is still missing her round ring since yesterday. Per patient, she heard it dropped yesterday when she was in the chair. RN and charge RN Athena Bland searched room and linens and was not able to find ring. Per patient, her daughter is aware of the ring missing and have also searched for it yesterday. Told patient, we will continue to look for it as the day progress.

## 2023-08-21 NOTE — Progress Notes (Signed)
 PROGRESS NOTE    Veronica Wolf  ZOX:[id number] DOB: September 16, 1940 DOA: 08/18/2023 PCP: Veronica Gaucher, NP  Chief Complaint  Patient presents with   Shortness of Breath    Brief Narrative:   Veronica Wolf is Veronica Wolf 83 y.o. female with medical history significant for prior NSTEMI, COPD, not on oxygen supplementation at baseline, CKD 3B, hypertension, hyperlipidemia, GERD, chronic anxiety/depression, obesity, who initially presented to her primary care provider due to shortness of breath x 1 week.   Imaging concerning for pneumonia, also being treated for HFpEF.  Assessment & Plan:   Principal Problem:   Acute hypoxemic respiratory failure (HCC)  Acute Hypoxemic Respiratory Failure Community Acquired Pneumonia HFpEF with Exacerbation  CT PE protocol with patchy airspace disease throughout the left lung concerning for pneumonia Echo with EF 65-70%, no RWMA, grade 1 diastolic dysfunction - IVC dilated with <50% resp variability  proBNP 3241 - clinically doesn't appear overloaded, but with elevated BNP and dilated IVC will diurese as tolerated Ceftriaxone , azithromycin  Hold further diuresis  Strict I/O, daily weights  Hypertension BP on low side, hold lisinopril  Continue metop  CKD 3B Baseline creatinine 1.4-1.5 Currently around baseline Follow with diuresis   Type 2 diabetes with hyperglycemia. Last hemoglobin A1c 6.8 on 04/22/2023 Heart healthy carb modified diet Insulin  sliding scale.   Hypothyroidism levothyroxine    Chronic anxiety Valium  prn  lexapro    Obesity BMI 33 Recommend weight loss outpatient with regular physical activity and healthy dieting.   Generalized weakness PT OT evaluation Fall precautions   Hyperlipidemia Lipitor  and Zetia   Obesity Body mass index is 32.72 kg/m.     DVT prophylaxis: lovenox  Code Status: full Family Communication: none Disposition:   Status is: Inpatient Remains inpatient appropriate because: need for ongoing  inpatient care   Consultants:  none  Procedures:  Echo IMPRESSIONS     1. Left ventricular ejection fraction, by estimation, is 65 to 70%. The  left ventricle has normal function. The left ventricle has no regional  wall motion abnormalities. There is mild concentric left ventricular  hypertrophy. Left ventricular diastolic  parameters are consistent with Grade I diastolic dysfunction (impaired  relaxation).   2. Right ventricular systolic function is normal. The right ventricular  size is normal. Tricuspid regurgitation signal is inadequate for assessing  PA pressure.   3. The mitral valve is normal in structure. Trivial mitral valve  regurgitation. No evidence of mitral stenosis.   4. The aortic valve was not well visualized. Aortic valve regurgitation  is not visualized. No aortic stenosis is present.   5. The inferior vena cava is dilated in size with <50% respiratory  variability, suggesting right atrial pressure of 15 mmHg.   Antimicrobials:  Anti-infectives (From admission, onward)    Start     Dose/Rate Route Frequency Ordered Stop   08/20/23 2200  azithromycin  (ZITHROMAX ) tablet 500 mg        500 mg Oral Daily at bedtime 08/20/23 1334     08/19/23 0230  cefTRIAXone  (ROCEPHIN ) 2 g in sodium chloride  0.9 % 100 mL IVPB        2 g 200 mL/hr over 30 Minutes Intravenous Daily at bedtime 08/19/23 0221     08/19/23 0230  azithromycin  (ZITHROMAX ) 500 mg in sodium chloride  0.9 % 250 mL IVPB  Status:  Discontinued        500 mg 250 mL/hr over 60 Minutes Intravenous Daily at bedtime 08/19/23 0221 08/20/23 1334       Subjective: No new complaints  Asking for help sitting up in bed  Objective: Vitals:   08/21/23 0740 08/21/23 0814 08/21/23 1110 08/21/23 1454  BP: 120/61  116/67 (!) 126/50  Pulse: 70  73 67  Resp: 18  17 18   Temp: 97.7 F (36.5 C)  97.7 F (36.5 C) 98.7 F (37.1 C)  TempSrc: Oral  Oral Oral  SpO2: 100% 100% 96% 100%  Weight:      Height:         Intake/Output Summary (Last 24 hours) at 08/21/2023 1551 Last data filed at 08/21/2023 1300 Gross per 24 hour  Intake 455.23 ml  Output 750 ml  Net -294.77 ml   Filed Weights   08/19/23 0258 08/20/23 0443 08/21/23 0620  Weight: 87 kg 87.5 kg 86.5 kg    Examination:  General: No acute distress. Cardiovascular: RRR Lungs: unlabored, diminished Neurological: Alert and oriented 3. Moves all extremities 4 with equal strength. Cranial nerves II through XII grossly intact. Extremities: No clubbing or cyanosis. No edema.  Data Reviewed: I have personally reviewed following labs and imaging studies  CBC: Recent Labs  Lab 08/18/23 1041 08/19/23 1029 08/20/23 1027 08/21/23 0313  WBC 13.7* 15.0* 11.0* 10.4  NEUTROABS  --   --   --  6.7  HGB 10.8* 11.0* 10.9* 10.6*  HCT 34.8* 34.2* 35.6* 33.3*  MCV 92.6 91.9 93.2 91.2  PLT 285 251 287 273    Basic Metabolic Panel: Recent Labs  Lab 08/18/23 1058 08/18/23 1332 08/19/23 1029 08/20/23 1027 08/21/23 0313  NA 146*  --  141 140 139  K 3.4*  --  3.4* 3.8 4.7  CL 109  --  103 103 102  CO2 26  --  26 28 27   GLUCOSE 108*  --  116* 155* 101*  BUN 12  --  12 11 12   CREATININE 1.20*  --  1.31* 1.42* 1.37*  CALCIUM  8.6*  --  8.4* 8.1* 8.3*  MG  --  1.7 1.6*  --  1.8  PHOS  --   --  2.0*  --  4.4    GFR: Estimated Creatinine Clearance: 32.5 mL/min (Veronica Wolf) (by C-G formula based on SCr of 1.37 mg/dL (H)).  Liver Function Tests: Recent Labs  Lab 08/18/23 1058 08/20/23 1027 08/21/23 0313  AST 16 19 18   ALT 9 12 11   ALKPHOS 72 51 47  BILITOT <0.2 0.5 0.3  PROT 6.9 6.9 6.4*  ALBUMIN 3.3* 2.4* 2.4*    CBG: Recent Labs  Lab 08/20/23 1108 08/20/23 1610 08/20/23 2104 08/21/23 0617 08/21/23 1108  GLUCAP 140* 103* 118* 94 111*     Recent Results (from the past 240 hours)  Resp panel by RT-PCR (RSV, Flu Lyndal Alamillo&B, Covid) Anterior Nasal Swab     Status: None   Collection Time: 08/18/23 11:00 AM   Specimen: Anterior Nasal  Swab  Result Value Ref Range Status   SARS Coronavirus 2 by RT PCR NEGATIVE NEGATIVE Final    Comment: (NOTE) SARS-CoV-2 target nucleic acids are NOT DETECTED.  The SARS-CoV-2 RNA is generally detectable in upper respiratory specimens during the acute phase of infection. The lowest concentration of SARS-CoV-2 viral copies this assay can detect is 138 copies/mL. Airyonna Franklyn negative result does not preclude SARS-Cov-2 infection and should not be used as the sole basis for treatment or other patient management decisions. Ewen Varnell negative result may occur with  improper specimen collection/handling, submission of specimen other than nasopharyngeal swab, presence of viral mutation(s) within the areas targeted by this assay,  and inadequate number of viral copies(<138 copies/mL). Nicolasa Milbrath negative result must be combined with clinical observations, patient history, and epidemiological information. The expected result is Negative.  Fact Sheet for Patients:  BloggerCourse.com  Fact Sheet for Healthcare Providers:  SeriousBroker.it  This test is no t yet approved or cleared by the [place]  FDA and  has been authorized for detection and/or diagnosis of SARS-CoV-2 by FDA under an Emergency Use Authorization (EUA). This EUA will remain  in effect (meaning this test can be used) for the duration of the COVID-19 declaration under Section 564(b)(1) of the Act, 21 U.S.C.section 360bbb-3(b)(1), unless the authorization is terminated  or revoked sooner.       Influenza Keelin Neville by PCR NEGATIVE NEGATIVE Final   Influenza B by PCR NEGATIVE NEGATIVE Final    Comment: (NOTE) The Xpert Xpress SARS-CoV-2/FLU/RSV plus assay is intended as an aid in the diagnosis of influenza from Nasopharyngeal swab specimens and should not be used as Destany Severns sole basis for treatment. Nasal washings and aspirates are unacceptable for Xpert Xpress SARS-CoV-2/FLU/RSV testing.  Fact Sheet for  Patients: BloggerCourse.com  Fact Sheet for Healthcare Providers: SeriousBroker.it  This test is not yet approved or cleared by the [place]  FDA and has been authorized for detection and/or diagnosis of SARS-CoV-2 by FDA under an Emergency Use Authorization (EUA). This EUA will remain in effect (meaning this test can be used) for the duration of the COVID-19 declaration under Section 564(b)(1) of the Act, 21 U.S.C. section 360bbb-3(b)(1), unless the authorization is terminated or revoked.     Resp Syncytial Virus by PCR NEGATIVE NEGATIVE Final    Comment: (NOTE) Fact Sheet for Patients: BloggerCourse.com  Fact Sheet for Healthcare Providers: SeriousBroker.it  This test is not yet approved or cleared by the [place]  FDA and has been authorized for detection and/or diagnosis of SARS-CoV-2 by FDA under an Emergency Use Authorization (EUA). This EUA will remain in effect (meaning this test can be used) for the duration of the COVID-19 declaration under Section 564(b)(1) of the Act, 21 U.S.C. section 360bbb-3(b)(1), unless the authorization is terminated or revoked.  Performed at [facility], [address], [place], [place] [address]          Radiology Studies: No results found.       Scheduled Meds:  aspirin  EC  81 mg Oral Daily   atorvastatin   80 mg Oral q1800   azithromycin   500 mg Oral QHS   enoxaparin  (LOVENOX ) injection  40 mg Subcutaneous Daily   escitalopram   20 mg Oral Q lunch   ezetimibe   10 mg Oral Q lunch   insulin  aspart  0-9 Units Subcutaneous TID WC   ipratropium-albuterol   3 mL Nebulization BID   levothyroxine   25 mcg Oral Q0600   metoprolol  succinate  12.5 mg Oral QPM   potassium chloride  SA  20 mEq Oral Q1200   sodium chloride  flush  10-40 mL Intracatheter Q12H   Continuous Infusions:  cefTRIAXone  (ROCEPHIN )  IV 2 g  (08/20/23 2113)     LOS: 3 days    Time spent: over 30 min    Donnetta Gains, MD [facility]   To contact the attending provider between 7A-7P or the covering provider during after hours 7P-7A, please log into the web site [email or web address] and access using universal [place] password for that web site. If you do not have the password, please call the hospital operator.  08/21/2023, 3:51 PM

## 2023-08-21 NOTE — Progress Notes (Signed)
 Mobility Specialist: Progress Note   08/21/23 1548  Mobility  Activity Transferred from bed to chair  Level of Assistance Contact guard assist, steadying assist  Assistive Device None  Activity Response Tolerated well  Mobility Referral Yes  Mobility visit 1 Mobility  Mobility Specialist Start Time (ACUTE ONLY) 1215  Mobility Specialist Stop Time (ACUTE ONLY) 1221  Mobility Specialist Time Calculation (min) (ACUTE ONLY) 6 min    Pt very pleasant, received in bed and agreeable to transfer to chair. SV for bed mobility. CG w/ HHA for STS, CG w/o AD for stand pivot to chair. Left in chair with all needs met, call bell in reach.   Deloria Fetch Mobility Specialist Please contact via SecureChat or Rehab office at [phone]

## 2023-08-21 NOTE — Plan of Care (Signed)
  Problem: Coping: Goal: Ability to adjust to condition or change in health will improve Outcome: Progressing   Problem: Fluid Volume: Goal: Ability to maintain a balanced intake and output will improve Outcome: Progressing   Problem: Health Behavior/Discharge Planning: Goal: Ability to identify and utilize available resources and services will improve Outcome: Progressing   Problem: Skin Integrity: Goal: Risk for impaired skin integrity will decrease Outcome: Progressing

## 2023-08-21 NOTE — Plan of Care (Signed)

## 2023-08-22 ENCOUNTER — Other Ambulatory Visit (HOSPITAL_COMMUNITY): Payer: Self-pay

## 2023-08-22 DIAGNOSIS — J9601 Acute respiratory failure with hypoxia: Secondary | ICD-10-CM | POA: Diagnosis not present

## 2023-08-22 LAB — COMPREHENSIVE METABOLIC PANEL WITH GFR
ALT: 13 U/L (ref 0–44)
AST: 22 U/L (ref 15–41)
Albumin: 2.5 g/dL — ABNORMAL LOW (ref 3.5–5.0)
Alkaline Phosphatase: 55 U/L (ref 38–126)
Anion gap: 11 (ref 5–15)
BUN: 15 mg/dL (ref 8–23)
CO2: 26 mmol/L (ref 22–32)
Calcium: 8.5 mg/dL — ABNORMAL LOW (ref 8.9–10.3)
Chloride: 100 mmol/L (ref 98–111)
Creatinine, Ser: 1.55 mg/dL — ABNORMAL HIGH (ref 0.44–1.00)
GFR, Estimated: 33 mL/min — ABNORMAL LOW (ref >60)
Glucose, Bld: 101 mg/dL — ABNORMAL HIGH (ref 70–99)
Potassium: 4.6 mmol/L (ref 3.5–5.1)
Sodium: 137 mmol/L (ref 135–145)
Total Bilirubin: 0.4 mg/dL (ref 0.0–1.2)
Total Protein: 6.8 g/dL (ref 6.5–8.1)

## 2023-08-22 LAB — CBC WITH DIFFERENTIAL/PLATELET
Abs Immature Granulocytes: 0.07 10*3/uL (ref 0.00–0.07)
Basophils Absolute: 0.1 10*3/uL (ref 0.0–0.1)
Basophils Relative: 1 %
Eosinophils Absolute: 0.7 10*3/uL — ABNORMAL HIGH (ref 0.0–0.5)
Eosinophils Relative: 5 %
HCT: 34.4 % — ABNORMAL LOW (ref 36.0–46.0)
Hemoglobin: 10.7 g/dL — ABNORMAL LOW (ref 12.0–15.0)
Immature Granulocytes: 1 %
Lymphocytes Relative: 22 %
Lymphs Abs: 2.8 10*3/uL (ref 0.7–4.0)
MCH: 28.9 pg (ref 26.0–34.0)
MCHC: 31.1 g/dL (ref 30.0–36.0)
MCV: 93 fL (ref 80.0–100.0)
Monocytes Absolute: 0.8 10*3/uL (ref 0.1–1.0)
Monocytes Relative: 6 %
Neutro Abs: 8.4 10*3/uL — ABNORMAL HIGH (ref 1.7–7.7)
Neutrophils Relative %: 65 %
Platelets: 290 10*3/uL (ref 150–400)
RBC: 3.7 MIL/uL — ABNORMAL LOW (ref 3.87–5.11)
RDW: 16.6 % — ABNORMAL HIGH (ref 11.5–15.5)
WBC: 12.7 10*3/uL — ABNORMAL HIGH (ref 4.0–10.5)
nRBC: 0 % (ref 0.0–0.2)

## 2023-08-22 LAB — GLUCOSE, CAPILLARY
Glucose-Capillary: 105 mg/dL — ABNORMAL HIGH (ref 70–99)
Glucose-Capillary: 95 mg/dL (ref 70–99)
Glucose-Capillary: 94 mg/dL (ref 70–99)
Glucose-Capillary: 95 mg/dL (ref 70–99)

## 2023-08-22 LAB — MAGNESIUM: Magnesium: 1.8 mg/dL (ref 1.7–2.4)

## 2023-08-22 LAB — PHOSPHORUS: Phosphorus: 3.9 mg/dL (ref 2.5–4.6)

## 2023-08-22 MED ORDER — AMOXICILLIN 500 MG PO CAPS
1000.0000 mg | ORAL_CAPSULE | Freq: Three times a day (TID) | ORAL | 0 refills | Status: AC
  Filled 2023-08-22: qty 18, 3d supply, fill #0

## 2023-08-22 MED ORDER — OXYCODONE HCL 5 MG PO TABS
5.0000 mg | ORAL_TABLET | ORAL | Status: AC
  Administered 2023-08-22: 5 mg via ORAL
  Filled 2023-08-22: qty 1

## 2023-08-22 MED ORDER — FUROSEMIDE 40 MG PO TABS
20.0000 mg | ORAL_TABLET | Freq: Every day | ORAL | 0 refills | Status: DC | PRN
  Filled 2023-08-22: qty 30, 60d supply, fill #0

## 2023-08-22 MED ORDER — TRAZODONE HCL 50 MG PO TABS
50.0000 mg | ORAL_TABLET | ORAL | Status: AC
  Administered 2023-08-22: 50 mg via ORAL
  Filled 2023-08-22: qty 1

## 2023-08-22 MED ORDER — AZITHROMYCIN 500 MG PO TABS
500.0000 mg | ORAL_TABLET | Freq: Every day | ORAL | 0 refills | Status: AC
  Filled 2023-08-22: qty 1, 1d supply, fill #0

## 2023-08-22 MED ORDER — ENOXAPARIN SODIUM 30 MG/0.3ML IJ SOSY: 30.0000 mg | PREFILLED_SYRINGE | Freq: Every day | INTRAMUSCULAR | Status: DC

## 2023-08-22 NOTE — TOC CM/SW Note (Signed)
 Transition of Care [facility]) - Progression Note   Patient Details  Name: Veronica Wolf MRN: [id number] Date of Birth: 12-23-1940  Transition of [facility]) CM/SW Contact:    Arron Big, LCSWA Phone Number: 08/22/2023, 11:10 AM   Clinical Narrative: CSW met patient at bedside and asked her about transportation needs. Patient stated her daughter will be picking her up at discharge and helps assist her when needed.   TOC will continue to follow as needed.   08/22/23 1109  SDOH Interventions  Transportation Interventions Inpatient TOC;Other (Comment) (Daughter is providing transportation)    Transition of Care Asessment: Insurance and Status: Insurance coverage has been reviewed Patient has primary care physician: Yes Home environment has been reviewed: home alone Prior level of function:: indep Prior/Current Home Services: No current home services Social Drivers of Health Review: SDOH reviewed no interventions necessary Readmission risk has been reviewed: Yes Transition of care needs: transition of care needs identified, TOC will continue to follow

## 2023-08-22 NOTE — Discharge Summary (Addendum)
 Physician Discharge Summary  Veronica Wolf WUJ:[id number] DOB: [birth year] DOA: 08/18/2023  PCP: Dorrene Gaucher, NP  Admit date: 08/18/2023 Discharge date: 08/22/2023  Time spent: 40 minutes  Recommendations for Outpatient Follow-up:  Follow outpatient CBC/CMP  Follow repeat CXR in 2-3 weeks outpatient Follow volume status, discharged with prn lasix  - follow volume/lytes  Follow BG's outpatient  Discharge Diagnoses:  Principal Problem:   Acute hypoxemic respiratory failure [facility])   Discharge Condition: stable  Diet recommendation: heart healthy  Filed Weights   08/20/23 0443 08/21/23 0620 08/22/23 0435  Weight: 87.5 kg 86.5 kg 89.4 kg    History of present illness:   Veronica Wolf is Veronica Wolf 83 y.o. female with medical history significant for prior NSTEMI, COPD, not on oxygen supplementation at baseline, CKD 3B, hypertension, hyperlipidemia, GERD, chronic anxiety/depression, obesity, who initially presented to her primary care provider due to shortness of breath x 1 week.    Imaging concerning for pneumonia, also being treated for HFpEF/mild volume overload.  She's improved, stable for discharge today with oxygen with activity.   Hospital Course:  Assessment and Plan:  Acute Hypoxemic Respiratory Failure Community Acquired Pneumonia HFpEF with Exacerbation  CT PE protocol with patchy airspace disease throughout the left lung concerning for pneumonia Echo with EF 65-70%, no RWMA, grade 1 diastolic dysfunction - IVC dilated with <50% resp variability  proBNP 3241 - clinically doesn't appear overloaded, but with elevated BNP and dilated IVC will diurese as tolerated Ceftriaxone , azithromycin  -> complete course at discharge Requiring 2 L with activity, will arrange home health at discharge Strict I/O, daily weights   HFpEF with Exacerbation Elevated BNP, mild edema earlier in hospitalization - possible mild exacerbation of diastolic HF Improved with diuresis Echo showed  dilated IVC, diastolic dysfunction She's euvolemic, will d/c with lasix  only prn Follow outpatient  Hypertension Resume home meds (lisinopril , metop)    CKD 3B Baseline creatinine 1.4-1.5 Currently around baseline   Type 2 diabetes with hyperglycemia. Last hemoglobin A1c 6.8 on 04/22/2023 Heart healthy carb modified diet Insulin  sliding scale.   Hypothyroidism levothyroxine    Chronic anxiety Valium  prn  lexapro    Generalized weakness PT OT evaluation - recommending HH  Fall precautions   Hyperlipidemia Lipitor  and Zetia    Obesity Body mass index is 32.72 kg/m.     Procedures:  Echo IMPRESSIONS     1. Left ventricular ejection fraction, by estimation, is 65 to 70%. The  left ventricle has normal function. The left ventricle has no regional  wall motion abnormalities. There is mild concentric left ventricular  hypertrophy. Left ventricular diastolic  parameters are consistent with Grade I diastolic dysfunction (impaired  relaxation).   2. Right ventricular systolic function is normal. The right ventricular  size is normal. Tricuspid regurgitation signal is inadequate for assessing  PA pressure.   3. The mitral valve is normal in structure. Trivial mitral valve  regurgitation. No evidence of mitral stenosis.   4. The aortic valve was not well visualized. Aortic valve regurgitation  is not visualized. No aortic stenosis is present.   5. The inferior vena cava is dilated in size with <50% respiratory  variability, suggesting right atrial pressure of 15 mmHg.   Consultations: none  Discharge Exam: Vitals:   08/22/23 0938 08/22/23 1150  BP:  (!) 149/73  Pulse:  69  Resp:  18  Temp:    SpO2: (!) 88% 92%   No complaints Eager to go home  General: No acute distress. Cardiovascular: Heart sounds show Veronica Wolf  regular rate, and rhythm. Lungs: Clear to auscultation bilaterally  Abdomen: Soft, nontender, nondistended  Neurological: Alert and oriented 3. Moves  all extremities 4 with equal strength. Cranial nerves II through XII grossly intact. Extremities: No clubbing or cyanosis. No edema.   Discharge Instructions   Discharge Instructions     Call MD for:  difficulty breathing, headache or visual disturbances   Complete by: As directed    Call MD for:  extreme fatigue   Complete by: As directed    Call MD for:  hives   Complete by: As directed    Call MD for:  persistant dizziness or light-headedness   Complete by: As directed    Call MD for:  persistant nausea and vomiting   Complete by: As directed    Call MD for:  redness, tenderness, or signs of infection (pain, swelling, redness, odor or green/yellow discharge around incision site)   Complete by: As directed    Call MD for:  severe uncontrolled pain   Complete by: As directed    Call MD for:  temperature >100.4   Complete by: As directed    Diet - low sodium heart healthy   Complete by: As directed    Discharge instructions   Complete by: As directed    You were admitted for pneumonia.    You've improved with antibiotics, but you still need some extra oxygen with activity.  We'll send you home with Veronica Wolf few more days of antibiotics.  In addition, you should use oxygen when you're up and about and when you're sleeping.  Follow with your outpatient doctor to determine how long you'll need your oxygen and when this can be weaned.  We gave you some lasix  here for fluid overload, but I don't think you need any additional lasix  right now.  I'll give you lasix  to use only as needed.  Check your weight every day.  If your weight increases by over 2 lbs in 1 day or by 5 lbs in Veronica Wolf week, take Veronica Wolf dose of lasix  and call your PCP to arrange follow up to discuss your fluid status further.    Your A1c is borderline, follow your blood sugars with your PCP.  You may eventually need some medicines for your diabetes.   Return for new, recurrent, or worsening symptoms.  Please ask your PCP to request  records from this hospitalization so they know what was done and what the next steps will be.   Increase activity slowly   Complete by: As directed    No wound care   Complete by: As directed       Allergies as of 08/22/2023       Reactions   Ibuprofen Other (See Comments)   Pt has hx of peptic ulcer and diverticulitis.         Medication List     TAKE these medications    Accu-Chek Guide w/Device Kit Use to test blood sugar in the morning, at noon, and at bedtime.   albuterol  (2.5 MG/3ML) 0.083% nebulizer solution Commonly known as: PROVENTIL  USE 1 VIAL IN NEBULIZER EVERY 6 HOURS AS NEEDED FOR WHEEZING AND FOR SHORTNESS OF BREATH   albuterol  108 (90 Base) MCG/ACT inhaler Commonly known as: VENTOLIN  HFA Inhale 2 puffs into the lungs every 6 (six) hours as needed for wheezing or shortness of breath.   allopurinol  100 MG tablet Commonly known as: ZYLOPRIM  Take 2 tablets (200 mg total) by mouth daily.   amoxicillin  500  MG capsule Commonly known as: AMOXIL  Take 2 capsules (1,000 mg total) by mouth 3 (three) times daily for 3 days.   aspirin  EC 81 MG tablet Take 1 tablet (81 mg total) by mouth daily. Swallow whole.   atorvastatin  80 MG tablet Commonly known as: LIPITOR  Take 1 tablet (80 mg total) by mouth daily at 6 PM   azithromycin  500 MG tablet Commonly known as: ZITHROMAX  Take 1 tablet (500 mg total) by mouth daily for 1 day.   clobetasol  ointment 0.05 % Commonly known as: TEMOVATE  Apply 1 Application topically 2 (two) times daily. For up to 2 weeks.   colchicine  0.6 MG tablet Take 0.6 mg by mouth daily as needed (gout).   diazepam  10 MG tablet Commonly known as: VALIUM  Take 1 tablet (10 mg total) by mouth every 12 (twelve) hours as needed for anxiety. What changed: reasons to take this   dicyclomine  10 MG capsule Commonly known as: BENTYL  Take 1 capsule (10 mg total) by mouth 2 (two) times daily.   escitalopram  20 MG tablet Commonly known as:  LEXAPRO  Take 1 tablet (20 mg total) by mouth daily with lunch   ezetimibe  10 MG tablet Commonly known as: ZETIA  Take 1 tablet (10 mg total) by mouth daily with lunch.   famotidine  20 MG tablet Commonly known as: PEPCID  Take 1 tablet (20 mg total) by mouth in the morning and at bedtime.   furosemide  40 MG tablet Commonly known as: Lasix  Take 0.5 tablets (20 mg total) by mouth daily as needed. Take only for swelling or weight gain (2 lbs in Noe Goyer day or 5 lbs in Theo Reither week).  Call your PCP for follow up if you are using this frequently.   ibuprofen 200 MG tablet Commonly known as: ADVIL Take 200 mg by mouth every 6 (six) hours as needed for mild pain (pain score 1-3).   isosorbide  mononitrate 60 MG 24 hr tablet Commonly known as: IMDUR  Take 1 tablet (60 mg total) by mouth daily at 6 PM.   levothyroxine  25 MCG tablet Commonly known as: SYNTHROID  Take 1 tablet (25 mcg total) by mouth before breakfast.   lisinopril  2.5 MG tablet Commonly known as: ZESTRIL  Take 1 tablet (2.5 mg total) by mouth daily with lunch.   meclizine  25 MG tablet Commonly known as: ANTIVERT  Take 1 tablet (25 mg total) by mouth 3 (three) times daily as needed for dizziness.   metoprolol  succinate 25 MG 24 hr tablet Commonly known as: TOPROL -XL Take 0.5 tablets (12.5 mg total) by mouth every evening.   Nebulizer/Tubing/Mouthpiece Kit Use as directed   nitroGLYCERIN  0.4 MG SL tablet Commonly known as: NITROSTAT  Place 1 tablet (0.4 mg total) under the tongue every 5 (five) minutes as needed for chest pain.   nystatin  powder Commonly known as: MYCOSTATIN /NYSTOP  Apply 1 Application topically to the affected area(s) 2 (two) times daily. What changed:  when to take this reasons to take this   potassium chloride  SA 20 MEQ tablet Commonly known as: KLOR-CON  M Take 1 tablet (20 mEq total) by mouth daily at 12 noon.   Probiotic Daily Caps Take 1 tablet by mouth daily.   senna 8.6 MG Tabs tablet Commonly known  as: SENOKOT Take 1 tablet (8.6 mg total) by mouth at bedtime. What changed:  when to take this reasons to take this   sucralfate  1 g tablet Commonly known as: CARAFATE  Take 1 tablet (1 g total) by mouth 4 (four) times daily -  with meals and at  bedtime. Take one hour prior to morning medications.   valACYclovir  500 MG tablet Commonly known as: Valtrex  Take 1 tablet (500 mg total) by mouth every other day. What changed:  when to take this reasons to take this               Durable Medical Equipment  (From admission, onward)           Start     Ordered   08/22/23 1505  For home use only DME oxygen  Once       Comments: SATURATION QUALIFICATIONS: (This note is used to comply with regulatory documentation for home oxygen)   Patient Saturations on Room Air at Rest = 92%   Patient Saturations on Room Air while Ambulating = 84%   Patient Saturations on 2 Liters of oxygen while Ambulating = 91%   Please briefly explain why patient needs home oxygen: to maintain O2 sats >90%  Question Answer Comment  Length of Need 6 Months   Mode or (Route) Nasal cannula   Liters per Minute 2   Frequency Continuous (stationary and portable oxygen unit needed)   Oxygen conserving device Yes   Oxygen delivery system Gas      08/22/23 1504           Allergies  Allergen Reactions   Ibuprofen Other (See Comments)    Pt has hx of peptic ulcer and diverticulitis.     Follow-up Information     [facility] Follow up.   Specialty: Home Health Services Why: [phone] Agency will call you to set up apt times Contact information: [address] [place] [address] [phone]         [facility] Follow up.   Why: home oxygen Contact information: [phone]                 The results of significant diagnostics from this hospitalization (including imaging, microbiology, ancillary and laboratory) are listed below for reference.    Significant  Diagnostic Studies: ECHOCARDIOGRAM COMPLETE Result Date: 08/19/2023    ECHOCARDIOGRAM REPORT   Patient Name:   MARCINE GADWAY Date of Exam: 08/19/2023 Medical Rec #:  [id number]     Height:       64.0 in Accession #:    [id number]    Weight:       191.8 lb Date of Birth:  [birth year]     BSA:          1.922 m Patient Age:    83 years      BP:           131/71 mmHg Patient Gender: F             HR:           78 bpm. Exam Location:  Inpatient Procedure: 2D Echo, Cardiac Doppler and Color Doppler (Both Spectral and Color            Flow Doppler were utilized during procedure). Indications:    CHF  History:        Patient has prior history of Echocardiogram examinations, most                 recent 12/23/2016. Previous Myocardial Infarction, COPD; Risk                 Factors:Hypertension.  Sonographer:    Astrid Blamer Referring Phys: [id number] CAROLE N HALL IMPRESSIONS  1. Left ventricular ejection fraction, by estimation, is  65 to 70%. The left ventricle has normal function. The left ventricle has no regional wall motion abnormalities. There is mild concentric left ventricular hypertrophy. Left ventricular diastolic parameters are consistent with Grade I diastolic dysfunction (impaired relaxation).  2. Right ventricular systolic function is normal. The right ventricular size is normal. Tricuspid regurgitation signal is inadequate for assessing PA pressure.  3. The mitral valve is normal in structure. Trivial mitral valve regurgitation. No evidence of mitral stenosis.  4. The aortic valve was not well visualized. Aortic valve regurgitation is not visualized. No aortic stenosis is present.  5. The inferior vena cava is dilated in size with <50% respiratory variability, suggesting right atrial pressure of 15 mmHg. FINDINGS  Left Ventricle: Left ventricular ejection fraction, by estimation, is 65 to 70%. The left ventricle has normal function. The left ventricle has no regional wall motion abnormalities. The left ventricular  internal cavity size was normal in size. There is  mild concentric left ventricular hypertrophy. Left ventricular diastolic parameters are consistent with Grade I diastolic dysfunction (impaired relaxation). Right Ventricle: The right ventricular size is normal. No increase in right ventricular wall thickness. Right ventricular systolic function is normal. Tricuspid regurgitation signal is inadequate for assessing PA pressure. Left Atrium: Left atrial size was normal in size. Right Atrium: Right atrial size was normal in size. Pericardium: Trivial pericardial effusion is present. Mitral Valve: The mitral valve is normal in structure. Trivial mitral valve regurgitation. No evidence of mitral valve stenosis. Tricuspid Valve: The tricuspid valve is normal in structure. Tricuspid valve regurgitation is not demonstrated. Aortic Valve: The aortic valve was not well visualized. Aortic valve regurgitation is not visualized. No aortic stenosis is present. Aortic valve mean gradient measures 5.0 mmHg. Aortic valve peak gradient measures 6.7 mmHg. Aortic valve area, by VTI measures 2.34 cm. Pulmonic Valve: The pulmonic valve was normal in structure. Pulmonic valve regurgitation is not visualized. Aorta: The aortic root is normal in size and structure. Venous: The inferior vena cava is dilated in size with less than 50% respiratory variability, suggesting right atrial pressure of 15 mmHg. IAS/Shunts: No atrial level shunt detected by color flow Doppler.  LEFT VENTRICLE PLAX 2D LVIDd:         4.20 cm   Diastology LVIDs:         2.10 cm   LV e' medial:    6.96 cm/s LV PW:         0.80 cm   LV E/e' medial:  15.5 LV IVS:        1.00 cm   LV e' lateral:   9.90 cm/s LVOT diam:     1.80 cm   LV E/e' lateral: 10.9 LV SV:         68 LV SV Index:   35 LVOT Area:     2.54 cm  RIGHT VENTRICLE RV S prime:     14.90 cm/s TAPSE (M-mode): 1.6 cm LEFT ATRIUM             Index        RIGHT ATRIUM           Index LA Vol (A2C):   22.6 ml 11.76  ml/m  RA Area:     14.10 cm LA Vol (A4C):   36.1 ml 18.78 ml/m  RA Volume:   37.50 ml  19.51 ml/m LA Biplane Vol: 29.9 ml 15.56 ml/m  AORTIC VALVE AV Area (Vmax):    2.15 cm AV Area (Vmean):   2.07 cm  AV Area (VTI):     2.34 cm AV Vmax:           129.00 cm/s AV Vmean:          106.000 cm/s AV VTI:            0.289 m AV Peak Grad:      6.7 mmHg AV Mean Grad:      5.0 mmHg LVOT Vmax:         109.00 cm/s LVOT Vmean:        86.200 cm/s LVOT VTI:          0.266 m LVOT/AV VTI ratio: 0.92 MITRAL VALVE MV Area (PHT): 4.39 cm     SHUNTS MV Decel Time: 173 msec     Systemic VTI:  0.27 m MV E velocity: 108.00 cm/s  Systemic Diam: 1.80 cm MV Decie Verne velocity: 106.00 cm/s MV E/Eunice Winecoff ratio:  1.02 Dalton McleanMD Electronically signed by Archer Bear Signature Date/Time: 08/19/2023/1:04:04 PM    Final    CT Angio Chest PE W and/or Wo Contrast Result Date: 08/18/2023 CLINICAL DATA:  Pulmonary embolism (PE) suspected, low to intermediate prob, positive D-dimer. Shortness of breath EXAM: CT ANGIOGRAPHY CHEST WITH CONTRAST TECHNIQUE: Multidetector CT imaging of the chest was performed using the standard protocol during bolus administration of intravenous contrast. Multiplanar CT image reconstructions and MIPs were obtained to evaluate the vascular anatomy. RADIATION DOSE REDUCTION: This exam was performed according to the departmental dose-optimization program which includes automated exposure control, adjustment of the mA and/or kV according to patient size and/or use of iterative reconstruction technique. CONTRAST:  75mL OMNIPAQUE  IOHEXOL  350 MG/ML SOLN COMPARISON:  Chest x-ray today. FINDINGS: Cardiovascular: Heart is normal size. Aorta is normal caliber. Scattered aortic atherosclerosis. Mediastinum/Nodes: No mediastinal, hilar, or axillary adenopathy. Trachea and esophagus are unremarkable. Thyroid  unremarkable. Lungs/Pleura: Moderate emphysema. Patchy airspace disease throughout the left lung concerning for pneumonia. No  confluent opacity on the right. Atelectasis or scarring in the right middle lobe. No effusions. Upper Abdomen: No acute findings Musculoskeletal: Chest wall soft tissues are unremarkable. No acute bony abnormality. Review of the MIP images confirms the above findings. IMPRESSION: No evidence of pulmonary embolus. Patchy airspace disease throughout the left lung concerning for pneumonia. Aortic Atherosclerosis (ICD10-I70.0) and Emphysema (ICD10-J43.9). Electronically Signed   By: Janeece Mechanic M.D.   On: 08/18/2023 22:27   DG Chest 2 View Result Date: 08/18/2023 CLINICAL DATA:  shortness of breath EXAM: CHEST - 2 VIEW COMPARISON:  March 19, 2022 FINDINGS: Fine reticular, bilateral interstitial opacities. No focal airspace consolidation, pneumothorax, or pleural effusion. No cardiomegaly. Tortuous aorta. No acute fracture or destructive lesions. Multilevel degenerative disc disease of the spine. IMPRESSION: Fine, reticular bilateral interstitial opacities, which may reflect changes of an atypical/viral infection or interstitial edema. Laboratory correlation recommended. Electronically Signed   By: Rance Burrows M.D.   On: 08/18/2023 12:07    Microbiology: Recent Results (from the past 240 hours)  Resp panel by RT-PCR (RSV, Flu Jamol Ginyard&B, Covid) Anterior Nasal Swab     Status: None   Collection Time: 08/18/23 11:00 AM   Specimen: Anterior Nasal Swab  Result Value Ref Range Status   SARS Coronavirus 2 by RT PCR NEGATIVE NEGATIVE Final    Comment: (NOTE) SARS-CoV-2 target nucleic acids are NOT DETECTED.  The SARS-CoV-2 RNA is generally detectable in upper respiratory specimens during the acute phase of infection. The lowest concentration of SARS-CoV-2 viral copies this assay can detect is 138 copies/mL. Miyuki Rzasa negative result  does not preclude SARS-Cov-2 infection and should not be used as the sole basis for treatment or other patient management decisions. Parnika Tweten negative result may occur with  improper  specimen collection/handling, submission of specimen other than nasopharyngeal swab, presence of viral mutation(s) within the areas targeted by this assay, and inadequate number of viral copies(<138 copies/mL). Arch Methot negative result must be combined with clinical observations, patient history, and epidemiological information. The expected result is Negative.  Fact Sheet for Patients:  BloggerCourse.com  Fact Sheet for Healthcare Providers:  SeriousBroker.it  This test is no t yet approved or cleared by the [place]  FDA and  has been authorized for detection and/or diagnosis of SARS-CoV-2 by FDA under an Emergency Use Authorization (EUA). This EUA will remain  in effect (meaning this test can be used) for the duration of the COVID-19 declaration under Section 564(b)(1) of the Act, 21 U.S.C.section 360bbb-3(b)(1), unless the authorization is terminated  or revoked sooner.       Influenza Pal Shell by PCR NEGATIVE NEGATIVE Final   Influenza B by PCR NEGATIVE NEGATIVE Final    Comment: (NOTE) The Xpert Xpress SARS-CoV-2/FLU/RSV plus assay is intended as an aid in the diagnosis of influenza from Nasopharyngeal swab specimens and should not be used as Vasily Fedewa sole basis for treatment. Nasal washings and aspirates are unacceptable for Xpert Xpress SARS-CoV-2/FLU/RSV testing.  Fact Sheet for Patients: BloggerCourse.com  Fact Sheet for Healthcare Providers: SeriousBroker.it  This test is not yet approved or cleared by the [place]  FDA and has been authorized for detection and/or diagnosis of SARS-CoV-2 by FDA under an Emergency Use Authorization (EUA). This EUA will remain in effect (meaning this test can be used) for the duration of the COVID-19 declaration under Section 564(b)(1) of the Act, 21 U.S.C. section 360bbb-3(b)(1), unless the authorization is terminated or revoked.     Resp  Syncytial Virus by PCR NEGATIVE NEGATIVE Final    Comment: (NOTE) Fact Sheet for Patients: BloggerCourse.com  Fact Sheet for Healthcare Providers: SeriousBroker.it  This test is not yet approved or cleared by the [place]  FDA and has been authorized for detection and/or diagnosis of SARS-CoV-2 by FDA under an Emergency Use Authorization (EUA). This EUA will remain in effect (meaning this test can be used) for the duration of the COVID-19 declaration under Section 564(b)(1) of the Act, 21 U.S.C. section 360bbb-3(b)(1), unless the authorization is terminated or revoked.  Performed at [facility], [address] [place], [place] [address]      Labs: Basic Metabolic Panel: Recent Labs  Lab 08/18/23 1058 08/18/23 1332 08/19/23 1029 08/20/23 1027 08/21/23 0313 08/22/23 0332  NA 146*  --  141 140 139 137  K 3.4*  --  3.4* 3.8 4.7 4.6  CL 109  --  103 103 102 100  CO2 26  --  26 28 27 26   GLUCOSE 108*  --  116* 155* 101* 101*  BUN 12  --  12 11 12 15   CREATININE 1.20*  --  1.31* 1.42* 1.37* 1.55*  CALCIUM  8.6*  --  8.4* 8.1* 8.3* 8.5*  MG  --  1.7 1.6*  --  1.8 1.8  PHOS  --   --  2.0*  --  4.4 3.9   Liver Function Tests: Recent Labs  Lab 08/18/23 1058 08/20/23 1027 08/21/23 0313 08/22/23 0332  AST 16 19 18 22   ALT 9 12 11 13   ALKPHOS 72 51 47 55  BILITOT <0.2 0.5 0.3 0.4  PROT  6.9 6.9 6.4* 6.8  ALBUMIN 3.3* 2.4* 2.4* 2.5*   Recent Labs  Lab 08/18/23 1058  LIPASE 17   No results for input(s): "AMMONIA" in the last 168 hours. CBC: Recent Labs  Lab 08/18/23 1041 08/19/23 1029 08/20/23 1027 08/21/23 0313 08/22/23 0332  WBC 13.7* 15.0* 11.0* 10.4 12.7*  NEUTROABS  --   --   --  6.7 8.4*  HGB 10.8* 11.0* 10.9* 10.6* 10.7*  HCT 34.8* 34.2* 35.6* 33.3* 34.4*  MCV 92.6 91.9 93.2 91.2 93.0  PLT 285 251 287 273 290   Cardiac Enzymes: No results for input(s): "CKTOTAL", "CKMB", "CKMBINDEX",  "TROPONINI" in the last 168 hours. BNP: BNP (last 3 results) Recent Labs    08/20/23 1441 08/21/23 0313  BNP 35.4 20.8    ProBNP (last 3 results) Recent Labs    08/18/23 1130  PROBNP 3,241.0*    CBG: Recent Labs  Lab 08/21/23 1559 08/21/23 2111 08/22/23 0609 08/22/23 0621 08/22/23 1146  GLUCAP 115* 113* 105* 95 94       Signed:  Donnetta Gains MD.  [facility] 08/22/2023, 3:28 PM

## 2023-08-22 NOTE — TOC Transition Note (Signed)
 Transition of Care [facility]) - Discharge Note   Patient Details  Name: Veronica Wolf MRN: [id number] Date of Birth: 1940-09-05  Transition of Care [facility] CM/SW Contact:  Jennett Model, RN Phone Number: 08/22/2023, 3:28 PM   Clinical Narrative:    For dc today, she has transportation, she will need home oxygen,  she states she does not have a preference with DME agency.  NCM made referral to Jermaine with [employer].          Patient Goals and CMS Choice            Discharge Placement                       Discharge Plan and Services Additional resources added to the After Visit Summary for                                       Social Drivers of Health (SDOH) Interventions SDOH Screenings   Food Insecurity: Patient Declined (08/20/2023)  Recent Concern: Food Insecurity - Food Insecurity Present (07/13/2023)  Housing: Patient Declined (08/20/2023)  Transportation Needs: Unmet Transportation Needs (08/20/2023)  Utilities: Patient Declined (08/20/2023)  Alcohol Screen: Low Risk  (06/09/2022)  Depression (PHQ2-9): High Risk (04/22/2023)  Financial Resource Strain: Low Risk  (10/07/2021)  Physical Activity: Inactive (05/13/2021)  Social Connections: Moderately Integrated (08/20/2023)  Stress: Stress Concern Present (05/13/2021)  Tobacco Use: Medium Risk (08/18/2023)     Readmission Risk Interventions    08/19/2023    4:34 PM  Readmission Risk Prevention Plan  Transportation Screening Complete  Home Care Screening Complete  Medication Review (RN CM) Complete

## 2023-08-22 NOTE — Progress Notes (Signed)
 Physical Therapy Treatment Patient Details Name: Veronica Wolf MRN: [id number] DOB: 1940/12/15 Today's Date: 08/22/2023   History of Present Illness 83 y.o. female admitted 08/18/23 with SOB, hypoxia and  epigastric pain. Chest CT (+) left lung patchy infiltrates concerning for pneumonia. PMH: NSTEMI, COPD, not on oxygen supplementation at baseline, CKD 3B, hypertension, hyperlipidemia, GERD, chronic anxiety/depression, obesity.    PT Comments  Patient received standing at sink washing up with NT present. Patient is agreeable to PT session. She wants to go home. She ambulated 200 feet with cga, no AD. Mild instability. Mild Sob. Unable to get pulse ox reading on portable monitor until she returned to room and sitting down. Then was at 88%. She reports she has some form of O2 at home. Patient will continue to benefit from skilled PT to improve strength and safety with mobility.       If plan is discharge home, recommend the following: Supervision due to cognitive status;Assistance with cooking/housework;A little help with bathing/dressing/bathroom;A little help with walking and/or transfers;Assist for transportation;Help with stairs or ramp for entrance   Can travel by private vehicle      yes  Equipment Recommendations  Rolling walker (2 wheels)    Recommendations for Other Services       Precautions / Restrictions Precautions Precautions: Fall Precaution/Restrictions Comments: watch SpO2, difficult to get reading on finger while ambulating. No dynamaps have pulse ox attachement Restrictions Weight Bearing Restrictions Per Provider Order: No     Mobility  Bed Mobility               General bed mobility comments: NT, patient standing at sink washing up with NT present.    Transfers Overall transfer level: Needs assistance Equipment used: None Transfers: Sit to/from Stand Sit to Stand: Supervision                Ambulation/Gait Ambulation/Gait assistance: Contact  guard assist Gait Distance (Feet): 200 Feet Assistive device: None Gait Pattern/deviations: Step-through pattern, Decreased step length - right, Decreased step length - left Gait velocity: slightly decr     General Gait Details: standing rest break at nurses desk to try to get pulse ox reading. Unable. Mild instability. Requires cga.   Stairs             Wheelchair Mobility     Tilt Bed    Modified Rankin (Stroke Patients Only)       Balance Overall balance assessment: Needs assistance Sitting-balance support: Feet supported Sitting balance-Leahy Scale: Normal     Standing balance support: Single extremity supported, During functional activity Standing balance-Leahy Scale: Good                              Communication Communication Communication: No apparent difficulties  Cognition Arousal: Alert Behavior During Therapy: WFL for tasks assessed/performed   PT - Cognitive impairments: No family/caregiver present to determine baseline, Orientation, Problem solving, Attention, Memory   Orientation impairments: Time, Situation                     Following commands: Impaired Following commands impaired: Follows one step commands with increased time    Cueing Cueing Techniques: Verbal cues  Exercises      General Comments        Pertinent Vitals/Pain Pain Assessment Pain Assessment: No/denies pain    Home Living  Prior Function            PT Goals (current goals can now be found in the care plan section) Acute Rehab PT Goals Patient Stated Goal: go home Time For Goal Achievement: 09/02/23 Potential to Achieve Goals: Good Progress towards PT goals: Progressing toward goals    Frequency    Min 2X/week      PT Plan      Co-evaluation              AM-PAC PT "6 Clicks" Mobility   Outcome Measure  Help needed turning from your back to your side while in a flat bed without  using bedrails?: None Help needed moving from lying on your back to sitting on the side of a flat bed without using bedrails?: None Help needed moving to and from a bed to a chair (including a wheelchair)?: None Help needed standing up from a chair using your arms (e.g., wheelchair or bedside chair)?: None Help needed to walk in hospital room?: A Little Help needed climbing 3-5 steps with a railing? : A Little 6 Click Score: 22    End of Session   Activity Tolerance: Patient tolerated treatment well Patient left: in chair;with call bell/phone within reach;with chair alarm set Nurse Communication: Mobility status PT Visit Diagnosis: Unsteadiness on feet (R26.81);Muscle weakness (generalized) (M62.81)     Time: 4132-4401 PT Time Calculation (min) (ACUTE ONLY): 14 min  Charges:    $Gait Training: 8-22 mins PT General Charges $$ ACUTE PT VISIT: 1 Visit                     [place] Delauder, PT, GCS 08/22/23,9:45 AM

## 2023-08-22 NOTE — Progress Notes (Signed)
 SATURATION QUALIFICATIONS: (This note is used to comply with regulatory documentation for home oxygen)  Patient Saturations on Room Air at Rest = 92%  Patient Saturations on Room Air while Ambulating = 84%  Patient Saturations on 2 Liters of oxygen while Ambulating = 91%  Please briefly explain why patient needs home oxygen: to maintain O2 sats >90%

## 2023-08-22 NOTE — Progress Notes (Addendum)
 Occupational Therapy Treatment Patient Details Name: Veronica Wolf MRN: [id number] DOB: [birth year] Today's Date: 08/22/2023   History of present illness 83 y.o. female admitted 08/18/23 with SOB, hypoxia and  epigastric pain. Chest CT (+) left lung patchy infiltrates concerning for pneumonia. PMH: NSTEMI, COPD, not on oxygen supplementation at baseline, CKD 3B, hypertension, hyperlipidemia, GERD, chronic anxiety/depression, obesity.   OT comments  Pt making good progress with functional goals. Pt very pleasant, cooperative and requires CGA with LB bathing, dressing, and Sup with mobility using no AD to walk to bathroom, transfer to toilet and in/out of shower. O2 SATs >88% during in room activity. OT will continue to follow acutely       If plan is discharge home, recommend the following:  A little help with walking and/or transfers;A little help with bathing/dressing/bathroom;Assist for transportation;Help with stairs or ramp for entrance;Assistance with cooking/housework;Direct supervision/assist for medications management;Direct supervision/assist for financial management   Equipment Recommendations  Tub/shower seat    Recommendations for Other Services      Precautions / Restrictions Precautions Precautions: Fall Precaution/Restrictions Comments: watch O2 SATs Restrictions Weight Bearing Restrictions Per Provider Order: No       Mobility Bed Mobility Overal bed mobility: Needs Assistance Bed Mobility: Sit to Supine       Sit to supine: Contact guard assist        Transfers Overall transfer level: Needs assistance Equipment used: None   Sit to Stand: Supervision                 Balance Overall balance assessment: Needs assistance Sitting-balance support: Feet supported Sitting balance-Leahy Scale: Good     Standing balance support: Single extremity supported, During functional activity Standing balance-Leahy Scale: Good                              ADL either performed or assessed with clinical judgement   ADL Overall ADL's : Needs assistance/impaired     Grooming: Supervision/safety       Lower Body Bathing: Contact guard assist;Sit to/from stand       Lower Body Dressing: Contact guard assist;Sit to/from stand   Toilet Transfer: Supervision/safety;Ambulation;Regular Toilet;Grab bars   Toileting- Clothing Manipulation and Hygiene: Supervision/safety;Sit to/from stand   Tub/ Shower Transfer: Supervision/safety;Ambulation;Grab bars   Functional mobility during ADLs: Supervision/safety      Extremity/Trunk Assessment Upper Extremity Assessment Upper Extremity Assessment: Defer to OT evaluation   Lower Extremity Assessment Lower Extremity Assessment: Defer to PT evaluation        Vision Baseline Vision/History: 1 Wears glasses Patient Visual Report: No change from baseline     Perception     Praxis     Communication Communication Communication: No apparent difficulties   Cognition Arousal: Alert Behavior During Therapy: WFL for tasks assessed/performed Cognition: No family/caregiver present to determine baseline                               Following commands: Impaired Following commands impaired: Follows one step commands with increased time      Cueing   Cueing Techniques: Verbal cues  Exercises      Shoulder Instructions       General Comments      Pertinent Vitals/ Pain       Pain Assessment Pain Assessment: No/denies pain Pain Score: 0-No pain Pain Intervention(s): Monitored during session  Home Living  Prior Functioning/Environment              Frequency  Min 2X/week        Progress Toward Goals  OT Goals(current goals can now be found in the care plan section)  Progress towards OT goals: Progressing toward goals     Plan      Co-evaluation                 AM-PAC OT "6  Clicks" Daily Activity     Outcome Measure   Help from another person eating meals?: None Help from another person taking care of personal grooming?: A Little Help from another person toileting, which includes using toliet, bedpan, or urinal?: A Little Help from another person bathing (including washing, rinsing, drying)?: A Little Help from another person to put on and taking off regular upper body clothing?: A Little Help from another person to put on and taking off regular lower body clothing?: A Little 6 Click Score: 19    End of Session Equipment Utilized During Treatment: Gait belt  OT Visit Diagnosis: Unsteadiness on feet (R26.81);Pain Pain - Right/Left: Right   Activity Tolerance Patient tolerated treatment well   Patient Left in bed;with bed alarm set;with call bell/phone within reach   Nurse Communication Mobility status        Time: 1610-9604 OT Time Calculation (min): 27 min  Charges: OT General Charges $OT Visit: 1 Visit OT Treatments $Self Care/Home Management : 8-22 mins $Therapeutic Activity: 8-22 mins    Alfred Ann 08/22/2023, 1:03 PM

## 2023-08-22 NOTE — Care Management Important Message (Signed)
 Important Message  Patient Details  Name: ROSHAUNDA STARKEY MRN: [id number] Date of Birth: 05/23/1940   Important Message Given:        Wynonia Hedges 08/22/2023, 4:05 PM

## 2023-08-23 ENCOUNTER — Telehealth: Payer: Self-pay

## 2023-08-23 NOTE — Transitions of Care (Post Inpatient/ED Visit) (Signed)
   08/23/2023  Name: ASEES MANFREDI MRN: [id number] DOB: Oct 19, 1940  Today's TOC FU Call Status: Today's TOC FU Call Status:: Unsuccessful Call (1st Attempt) Unsuccessful Call (1st Attempt) Date: 08/23/23  Attempted to reach the patient regarding the most recent Inpatient/ED visit.  Follow Up Plan: Additional outreach attempts will be made to reach the patient to complete the Transitions of Care (Post Inpatient/ED visit) call.   Gareld June, BSN, RN [place]  VBCI - [employer] [facility] RN Care Manager [phone]

## 2023-08-24 ENCOUNTER — Telehealth: Payer: Self-pay

## 2023-08-24 ENCOUNTER — Ambulatory Visit: Payer: Self-pay

## 2023-08-24 NOTE — Transitions of Care (Post Inpatient/ED Visit) (Signed)
   08/24/2023  Name: Veronica Wolf MRN: [id number] DOB: 03/13/1941  Today's TOC FU Call Status: Today's TOC FU Call Status:: Unsuccessful Call (2nd Attempt) Unsuccessful Call (2nd Attempt) Date: 08/24/23  Attempted to reach the patient regarding the most recent Inpatient/ED visit.  Follow Up Plan: Additional outreach attempts will be made to reach the patient to complete the Transitions of Care (Post Inpatient/ED visit) call.   Tonia Frankel RN, CCM [place]  VBCI-[facility] RN Care Manager [phone]

## 2023-08-24 NOTE — Telephone Encounter (Signed)
 Copied from CRM [id number]. Topic: Clinical - Red Word Triage >> Aug 24, 2023  3:35 PM Adaysia C wrote: Red Word that prompted transfer to Nurse Triage: Patient was released from the hospital yesterday and is having chest pain and difficulty breathing; patient requested to speak with a nurse; patient warm transferred to nurse triage  Chief Complaint: chest pain, sob, wheezing Symptoms: see above Frequency: constant Pertinent Negatives: Patient denies pain in arm, back, neck.   Disposition: [x] ED /[] Urgent Care (no appt availability in office) / [] Appointment(In office/virtual)/ []  [place] [facility]/ [] Home Care/ [] Refused Recommended Disposition /[] [place] [facility]/ []  Follow-up with PCP Additional Notes: instructed to go to the ER; states was discharged yesterday from hospital. PCP office updated.   Reason for Disposition  Difficulty breathing  Answer Assessment - Initial Assessment Questions 1. LOCATION: "Where does it hurt?"       States cp and sob, middle of the chest 2. RADIATION: "Does the pain go anywhere else?" (e.g., into neck, jaw, arms, back)     no 3. ONSET: "When did the chest pain begin?" (Minutes, hours or days)      This am 4. PATTERN: "Does the pain come and go, or has it been constant since it started?"  "Does it get worse with exertion?"      constant 5. DURATION: "How long does it last" (e.g., seconds, minutes, hours)     To ER 6. SEVERITY: "How bad is the pain?"  (e.g., Scale 1-10; mild, moderate, or severe)    - MILD (1-3): doesn't interfere with normal activities     - MODERATE (4-7): interferes with normal activities or awakens from sleep    - SEVERE (8-10): excruciating pain, unable to do any normal activities       na 7. CARDIAC RISK FACTORS: "Do you have any history of heart problems or risk factors for heart disease?" (e.g., angina, prior heart attack; diabetes, high blood pressure, high cholesterol, smoker, or strong family history of heart  disease)     na 8. PULMONARY RISK FACTORS: "Do you have any history of lung disease?"  (e.g., blood clots in lung, asthma, emphysema, birth control pills)     na 9. CAUSE: "What do you think is causing the chest pain?"     na 10. OTHER SYMPTOMS: "Do you have any other symptoms?" (e.g., dizziness, nausea, vomiting, sweating, fever, difficulty breathing, cough)       na 11. PREGNANCY: "Is there any chance you are pregnant?" "When was your last menstrual period?"       na  Protocols used: Chest Pain-A-AH

## 2023-08-25 ENCOUNTER — Telehealth: Payer: Self-pay

## 2023-08-25 NOTE — Transitions of Care (Post Inpatient/ED Visit) (Signed)
   08/25/2023  Name: NEIL ERRICKSON MRN: [id number] DOB: 14-Jul-1940  Today's TOC FU Call Status: Today's TOC FU Call Status:: Unsuccessful Call (3rd Attempt) Unsuccessful Call (1st Attempt) Date: 08/23/23 Unsuccessful Call (2nd Attempt) Date: 08/24/23 Unsuccessful Call (3rd Attempt) Date: 08/25/23  Attempted to reach the patient regarding the most recent Inpatient/ED visit.  Follow Up Plan: No further outreach attempts will be made at this time. We have been unable to contact the patient.  Gareld June, BSN, RN [employer]  VBCI - [employer] [facility] RN Care Manager [phone]

## 2023-08-26 ENCOUNTER — Other Ambulatory Visit: Payer: Self-pay | Admitting: Family

## 2023-08-26 ENCOUNTER — Ambulatory Visit: Payer: Self-pay

## 2023-08-26 ENCOUNTER — Telehealth: Payer: Self-pay | Admitting: Family

## 2023-08-26 MED ORDER — NITROGLYCERIN 0.4 MG SL SUBL: 0.4000 mg | SUBLINGUAL_TABLET | SUBLINGUAL | 3 refills | Status: DC | PRN

## 2023-08-26 NOTE — Telephone Encounter (Signed)
 Attempted to reach pt at both numbers listed. No answer/mailboxes full.  Refill sent of nitroglycerin .  Looks like she is due for follow up with cardiology.  Henry Loge will you please reach out to patient and remind her to go to the ER if she develops chest pain not relieved by nitroglycerin ?   Dr. Emmette Harms- could your office please reach out to her to schedule a follow up visit with you and your team?   Thanks!

## 2023-08-26 NOTE — Addendum Note (Signed)
 Addended by: Dorrene Gaucher on: 08/26/2023 05:09 PM   Modules accepted: Orders

## 2023-08-26 NOTE — Telephone Encounter (Signed)
 Opened in error

## 2023-08-26 NOTE — Telephone Encounter (Signed)
  Chief Complaint: chest pain Symptoms: chest pain Frequency: last night Pertinent Negatives: Patient denies current pain Disposition: [] ED /[] Urgent Care (no appt availability in office) / [x] Appointment(In office/virtual)/ []  [place] [facility]/ [] Home Care/ [] Refused Recommended Disposition /[] [place] [facility]/ [x]  Follow-up with PCP Additional Notes: pt states that she is calling to see if she can get a prescription for the pain in her chest. States pain just below her breast and comes up to her breast on her left hand side. States this pain just started last night when she went to bed. States the pain was last night and not happening now. States pain went away and she was able to sleep. Pt states that she is asking for something to take for the pain. States that she as a prescription for nitroglycerin  pt states she is out of the medication.   Copied from CRM [id number]. Topic: Clinical - Red Word Triage >> Aug 26, 2023  4:10 PM Veronica Wolf wrote: Red Word that prompted transfer to Nurse Triage: Pain in chest since she was discharge 05/22 Reason for Disposition  [1] Chest pain lasting < 5 minutes AND [2] has not taken prescribed nitroglycerin   Protocols used: Chest Pain-A-AH

## 2023-08-27 ENCOUNTER — Other Ambulatory Visit (HOSPITAL_COMMUNITY): Payer: Self-pay

## 2023-08-30 ENCOUNTER — Other Ambulatory Visit: Payer: Self-pay

## 2023-08-30 MED ORDER — NITROGLYCERIN 0.4 MG SL SUBL
0.4000 mg | SUBLINGUAL_TABLET | SUBLINGUAL | 3 refills | Status: DC | PRN
  Filled 2023-09-08: qty 25, 5d supply, fill #0
  Filled 2023-09-24 (×2): qty 25, 5d supply, fill #1
  Filled 2023-11-14 (×2): qty 25, 5d supply, fill #2
  Filled 2023-11-23 (×2): qty 25, 5d supply, fill #3
  Filled 2023-12-03: qty 25, 5d supply, fill #4
  Filled 2023-12-08 (×3): qty 25, 5d supply, fill #5
  Filled 2023-12-13: qty 5, 1d supply, fill #6

## 2023-08-30 MED ORDER — NITROGLYCERIN 0.4 MG SL SUBL: 0.4000 mg | SUBLINGUAL_TABLET | SUBLINGUAL | 3 refills | Status: DC | PRN

## 2023-08-30 NOTE — Telephone Encounter (Signed)
 Was able to contact patient's daughter Aleda Hurl and she was given Dr. Ryan Coyer information and phone number. She said she will call her today.  She also ask for the pharmacy information to pick up Nitroglycerin 

## 2023-08-31 ENCOUNTER — Other Ambulatory Visit: Payer: Self-pay | Admitting: Nurse Practitioner

## 2023-08-31 ENCOUNTER — Other Ambulatory Visit: Payer: Self-pay | Admitting: Family

## 2023-08-31 ENCOUNTER — Other Ambulatory Visit: Payer: Self-pay

## 2023-08-31 ENCOUNTER — Inpatient Hospital Stay: Admitting: Family

## 2023-08-31 ENCOUNTER — Other Ambulatory Visit (HOSPITAL_COMMUNITY): Payer: Self-pay

## 2023-08-31 ENCOUNTER — Telehealth: Payer: Self-pay

## 2023-08-31 MED ORDER — EZETIMIBE 10 MG PO TABS
10.0000 mg | ORAL_TABLET | Freq: Every day | ORAL | 0 refills | Status: DC
Start: 2023-08-31 — End: 2023-12-06
  Filled 2023-08-31: qty 90, 90d supply, fill #0
  Filled 2023-09-06: qty 30, 30d supply, fill #0
  Filled 2023-10-06 (×2): qty 30, 30d supply, fill #1
  Filled 2023-11-08 (×2): qty 30, 30d supply, fill #2

## 2023-08-31 MED ORDER — LISINOPRIL 2.5 MG PO TABS
2.5000 mg | ORAL_TABLET | Freq: Every day | ORAL | 0 refills | Status: DC
Start: 2023-08-31 — End: 2023-12-06
  Filled 2023-08-31: qty 90, 90d supply, fill #0
  Filled 2023-09-06: qty 30, 30d supply, fill #0
  Filled 2023-10-06 (×2): qty 30, 30d supply, fill #1
  Filled 2023-11-08 (×2): qty 30, 30d supply, fill #2

## 2023-08-31 MED ORDER — ISOSORBIDE MONONITRATE ER 60 MG PO TB24
60.0000 mg | ORAL_TABLET | Freq: Every day | ORAL | 0 refills | Status: DC
Start: 2023-08-31 — End: 2023-12-06
  Filled 2023-08-31: qty 90, 90d supply, fill #0
  Filled 2023-09-06: qty 30, 30d supply, fill #0
  Filled 2023-10-06 (×2): qty 30, 30d supply, fill #1
  Filled 2023-11-08 (×2): qty 30, 30d supply, fill #2

## 2023-08-31 MED ORDER — ALLOPURINOL 100 MG PO TABS
200.0000 mg | ORAL_TABLET | Freq: Every day | ORAL | 0 refills | Status: DC
  Filled 2023-08-31: qty 180, 90d supply, fill #0
  Filled 2023-09-06: qty 60, 30d supply, fill #0
  Filled 2023-10-06 (×2): qty 60, 30d supply, fill #1
  Filled 2023-11-08 (×2): qty 60, 30d supply, fill #2

## 2023-08-31 NOTE — Telephone Encounter (Signed)
 Copied from CRM [id number]. Topic: General - Other >> Aug 31, 2023 11:36 AM Howard Macho wrote: Reason for CRM: Newton Barer from [facility] called stating she has made multiple attempts to schedule the patient for physical therapy home health, but there has been no answer and no returned call with messages  CB [phone]

## 2023-09-01 ENCOUNTER — Other Ambulatory Visit: Payer: Self-pay

## 2023-09-01 ENCOUNTER — Encounter: Payer: Self-pay | Admitting: Gastroenterology

## 2023-09-01 ENCOUNTER — Ambulatory Visit: Admitting: Gastroenterology

## 2023-09-01 ENCOUNTER — Other Ambulatory Visit (HOSPITAL_COMMUNITY): Payer: Self-pay

## 2023-09-01 VITALS — BP 132/62 | HR 67 | Ht 64.0 in | Wt 193.2 lb

## 2023-09-01 DIAGNOSIS — R1013 Epigastric pain: Secondary | ICD-10-CM

## 2023-09-01 DIAGNOSIS — K5900 Constipation, unspecified: Secondary | ICD-10-CM

## 2023-09-01 MED ORDER — SUCRALFATE 1 G PO TABS
1.0000 g | ORAL_TABLET | Freq: Three times a day (TID) | ORAL | 1 refills | Status: DC
  Filled 2023-09-01 – 2023-09-06 (×2): qty 120, 30d supply, fill #0
  Filled 2023-10-06 (×2): qty 120, 30d supply, fill #1

## 2023-09-01 NOTE — Progress Notes (Signed)
 Chief Complaint: follow-up upper abdominal pain, constipation Primary GI Doctor:Dr. Doraine Gallon  HPI: 83 y.o.  female known to Dr. Karene Oto with a past medical history not limited to  COPD, CKD, CAD with cardiac cath 2019, depression, HTN, HLD, allergic rhinitis, anxiety, GERD and chronic intermittent epigastric pain.  Patient last seen in GI office by Maureen Sour, NP on 02/24/23 for chronic epigastric pain and constipation. Endorses black stool but takes bismuth .  Fecal occult in office negative.Sucralfate  helps pain. Refilled reduced dose due to constipation and CKD.  CBC, LFT, lipase - labs are baseline  08/18/2023 patient went to ED with complaints of dry cough and shortness of breath.  Was seen by PCP and noted to be hypoxic at 89% on room air. Imaging concerning for pneumonia, also being treated for HFpEF/mild volume overload.  08/22/23 Discharged with oxygen with activity.   Interval History     Patient presents with upper abdominal pain.  Patient's daughter accompanied her at appointment as patient is a poor historian and she is currently living with her daughter since most recent hospitalization. She states last night she also had discomfort in her chest as well. No chest pain today. She has the pain with or without food. She does note she had coffee yesterday for the first time in a few weeks.  When we reviewed her medications she had problem recalling if she was taking certain medications or not.   She has been prescribed Pepcid  20 mg twice daily. She thinks she is taking it. She states she has continued taking OTC Pepto Bismul for abdominal pain. I reminded her this could make her constipation worse.   She also complains of constipation and states when she goes it is only small amount. She states she has Miralax  at home but has not been taking it every day.  Her daughter states she is going to make an appointment with her cardiologist for follow-up, but has not made one yet. She reported  chest pain to PCP and given nitroglycerin  to use prn which she states she has not had to use.   Patient on oxygen N/C 2L prn at home.   Wt Readings from Last 3 Encounters:  09/01/23 193 lb 4 oz (87.7 kg)  08/22/23 197 lb 1.5 oz (89.4 kg)  08/18/23 196 lb 3.2 oz (89 kg)     Past Medical History:  Diagnosis Date   Allergic rhinitis    Allergy    seasonal allergies   Anxiety    CKD (chronic kidney disease) 04/04/2017   COPD (chronic obstructive pulmonary disease) (HCC)    uses inhaler   Depression    Fatty liver    GERD (gastroesophageal reflux disease)    Gout    hx of   Hyperlipidemia    on meds   Hypertension    on meds   Myocardial infarction [facility])    Peptic ulcer 01/04/2003   Vertigo     Past Surgical History:  Procedure Laterality Date   ABDOMINAL HYSTERECTOMY     APPENDECTOMY     BIOPSY  08/13/2020   Procedure: BIOPSY;  Surgeon: Annis Kinder, DO;  Location: [facility];  Service: Gastroenterology;;   CORONARY PRESSURE/FFR STUDY N/A 01/27/2022   Procedure: INTRAVASCULAR PRESSURE WIRE/FFR STUDY;  Surgeon: Kyra Phy, MD;  Location: [facility] IN[facility]AS[facility] CV [facility];  Service: Cardiovascular;  Laterality: N/A;   ESOPHAGOGASTRODUODENOSCOPY (EGD) WITH PROPOFOL  N/A 08/13/2020   Procedure: ESOPHAGOGASTRODUODENOSCOPY (EGD) WITH PROPOFOL ;  Surgeon: Annis Kinder, DO;  Location: [facility];  Service: Gastroenterology;  Laterality: N/A;   LEFT HEART CATH AND CORONARY ANGIOGRAPHY N/A 06/13/2017   Procedure: LEFT HEART CATH AND CORONARY ANGIOGRAPHY;  Surgeon: Arleen Lacer, MD;  Location: [facility] INVASIVE CV LAB;  Service: Cardiovascular;  Laterality: N/A;   LEFT HEART CATH AND CORONARY ANGIOGRAPHY N/A 01/27/2022   Procedure: LEFT HEART CATH AND CORONARY ANGIOGRAPHY;  Surgeon: Kyra Phy, MD;  Location: [facility] INVASIVE CV LAB;  Service: Cardiovascular;  Laterality: N/A;   RIGHT/LEFT HEART CATH AND CORONARY ANGIOGRAPHY N/A 09/16/2017   Procedure: RIGHT/LEFT HEART CATH AND  CORONARY ANGIOGRAPHY;  Surgeon: Swaziland, Peter M, MD;  Location: [facility] INVASIVE CV LAB;  Service: Cardiovascular;  Laterality: N/A;   SAVORY DILATION N/A 08/13/2020   Procedure: SAVORY DILATION;  Surgeon: Annis Kinder, DO;  Location: [facility];  Service: Gastroenterology;  Laterality: N/A;   ULTRASOUND GUIDANCE FOR VASCULAR ACCESS  09/16/2017   Procedure: Ultrasound Guidance For Vascular Access;  Surgeon: Swaziland, Peter M, MD;  Location: [facility] INVASIVE CV LAB;  Service: Cardiovascular;;   WISDOM TOOTH EXTRACTION      Current Outpatient Medications  Medication Sig Dispense Refill   albuterol  (PROVENTIL ) (2.5 MG/3ML) 0.083% nebulizer solution USE 1 VIAL IN NEBULIZER EVERY 6 HOURS AS NEEDED FOR WHEEZING AND FOR SHORTNESS OF BREATH 150 mL 0   albuterol  (VENTOLIN  HFA) 108 (90 Base) MCG/ACT inhaler Inhale 2 puffs into the lungs every 6 (six) hours as needed for wheezing or shortness of breath. 6.7 g 2   allopurinol  (ZYLOPRIM ) 100 MG tablet Take 2 tablets (200 mg total) by mouth daily. 180 tablet 0   aspirin  81 MG EC tablet Take 1 tablet (81 mg total) by mouth daily. Swallow whole. 30 tablet 12   atorvastatin  (LIPITOR ) 80 MG tablet Take 1 tablet (80 mg total) by mouth daily at 6 PM 90 tablet 1   Blood Glucose Monitoring Suppl (ACCU-CHEK GUIDE ME) w/Device KIT Use to test blood sugar in the morning, at noon, and at bedtime. 1 kit 0   clobetasol  ointment (TEMOVATE ) 0.05 % Apply 1 Application topically 2 (two) times daily. For up to 2 weeks. 30 g 0   colchicine  0.6 MG tablet Take 0.6 mg by mouth daily as needed (gout).     diazepam  (VALIUM ) 10 MG tablet Take 1 tablet (10 mg total) by mouth every 12 (twelve) hours as needed for anxiety. (Patient taking differently: Take 10 mg by mouth every 12 (twelve) hours as needed for anxiety or sleep.) 60 tablet 0   dicyclomine  (BENTYL ) 10 MG capsule Take 1 capsule (10 mg total) by mouth 2 (two) times daily. 60 capsule 5   escitalopram  (LEXAPRO ) 20 MG tablet Take 1  tablet (20 mg total) by mouth daily with lunch 90 tablet 1   ezetimibe  (ZETIA ) 10 MG tablet Take 1 tablet (10 mg total) by mouth daily with lunch. 90 tablet 0   famotidine  (PEPCID ) 20 MG tablet Take 1 tablet (20 mg total) by mouth in the morning and at bedtime. 180 tablet 0   furosemide  (LASIX ) 40 MG tablet Take 0.5 tablets (20 mg total) by mouth daily as needed. Take only for swelling or weight gain (2 lbs in a day or 5 lbs in a week).  Call your PCP for follow up if you are using this frequently. 30 tablet 0   ibuprofen (ADVIL) 200 MG tablet Take 200 mg by mouth every 6 (six) hours as needed for mild pain (pain score 1-3).     isosorbide  mononitrate (  IMDUR ) 60 MG 24 hr tablet Take 1 tablet (60 mg total) by mouth daily at 6 PM. 90 tablet 0   levothyroxine  (SYNTHROID ) 25 MCG tablet Take 1 tablet (25 mcg total) by mouth before breakfast. 90 tablet 1   lisinopril  (ZESTRIL ) 2.5 MG tablet Take 1 tablet (2.5 mg total) by mouth daily with lunch. 90 tablet 0   meclizine  (ANTIVERT ) 25 MG tablet Take 1 tablet (25 mg total) by mouth 3 (three) times daily as needed for dizziness. 90 tablet 0   metoprolol  succinate (TOPROL -XL) 25 MG 24 hr tablet Take 0.5 tablets (12.5 mg total) by mouth every evening. 45 tablet 1   nitroGLYCERIN  (NITROSTAT ) 0.4 MG SL tablet Place 1 tablet (0.4 mg total) under the tongue every 5 (five) minutes as needed for chest pain. 45 tablet 3   nystatin  (MYCOSTATIN /NYSTOP ) powder Apply 1 Application topically to the affected area(s) 2 (two) times daily. (Patient taking differently: Apply 1 Application topically 2 (two) times daily as needed (for dryness).) 60 g 4   potassium chloride  SA (KLOR-CON  M) 20 MEQ tablet Take 1 tablet (20 mEq total) by mouth daily at 12 noon. 90 tablet 1   Probiotic Product (PROBIOTIC DAILY) CAPS Take 1 tablet by mouth daily. 90 capsule 0   Respiratory Therapy Supplies (NEBULIZER/TUBING/MOUTHPIECE) KIT Use as directed 1 kit 0   senna (SENOKOT) 8.6 MG TABS tablet Take  1 tablet (8.6 mg total) by mouth at bedtime. (Patient taking differently: Take 1 tablet by mouth daily as needed for mild constipation.)     sucralfate  (CARAFATE ) 1 g tablet Take 1 tablet (1 g total) by mouth 4 (four) times daily -  with meals and at bedtime. Take one hour prior to morning medications. 120 tablet 1   valACYclovir  (VALTREX ) 500 MG tablet Take 1 tablet (500 mg total) by mouth every other day. (Patient taking differently: Take 500 mg by mouth daily as needed (for fever blisters).) 45 tablet 1   No current facility-administered medications for this visit.    Allergies as of 09/01/2023 - Review Complete 09/01/2023  Allergen Reaction Noted   Ibuprofen Other (See Comments) 04/05/2017    Family History  Problem Relation Age of Onset   Breast cancer Mother    Stroke Father    Stomach cancer Neg Hx    Colon cancer Neg Hx    Pancreatic cancer Neg Hx    Esophageal cancer Neg Hx    Colon polyps Neg Hx    Rectal cancer Neg Hx     Review of Systems:    Constitutional: No weight loss, fever, chills, weakness or fatigue HEENT: Eyes: No change in vision               Ears, Nose, Throat:  No change in hearing or congestion Skin: No rash or itching Cardiovascular: No chest pain, chest pressure or palpitations   Respiratory: No SOB or cough Gastrointestinal: See HPI and otherwise negative Genitourinary: No dysuria or change in urinary frequency Neurological: No headache, dizziness or syncope Musculoskeletal: No new muscle or joint pain Hematologic: No bleeding or bruising Psychiatric: No history of depression or anxiety    Physical Exam:  Vital signs: BP 132/62   Pulse 67   Ht 5\' 4"  (1.626 m)   Wt 193 lb 4 oz (87.7 kg)   SpO2 91%   BMI 33.17 kg/m   Constitutional:   Pleasant  female appears to be in NAD, Well developed, Well nourished, alert and cooperative Throat: Oral cavity and  pharynx without inflammation, swelling or lesion.  Respiratory: Respirations even and  unlabored. Lungs clear to auscultation bilaterally.   No wheezes, crackles, or rhonchi.  Cardiovascular: Normal S1, S2. Regular rate and rhythm. No peripheral edema, cyanosis or pallor.  Gastrointestinal:  Soft, nondistended, nontender. No rebound or guarding. Normal bowel sounds. No appreciable masses or hepatomegaly. Rectal:  Not performed.  Msk:  Symmetrical without gross deformities. Without edema, no deformity or joint abnormality.  Neurologic:  Alert and  oriented x4;  grossly normal neurologically.  Skin:   Dry and intact without significant lesions or rashes. Psychiatric: Oriented to person, place and time. Demonstrates good judgement and reason without abnormal affect or behaviors.  RELEVANT LABS AND IMAGING: CBC    Latest Ref Rng & Units 08/22/2023    3:32 AM 08/21/2023    3:13 AM 08/20/2023   10:27 AM  CBC  WBC 4.0 - 10.5 K/uL 12.7  10.4  11.0   Hemoglobin 12.0 - 15.0 g/dL 16.1  09.6  04.5   Hematocrit 36.0 - 46.0 % 34.4  33.3  35.6   Platelets 150 - 400 K/uL 290  273  287      CMP     Latest Ref Rng & Units 08/22/2023    3:32 AM 08/21/2023    3:13 AM 08/20/2023   10:27 AM  CMP  Glucose 70 - 99 mg/dL 409  811  914   BUN 8 - 23 mg/dL 15  12  11    Creatinine 0.44 - 1.00 mg/dL 7.82  9.56  2.13   Sodium 135 - 145 mmol/L 137  139  140   Potassium 3.5 - 5.1 mmol/L 4.6  4.7  3.8   Chloride 98 - 111 mmol/L 100  102  103   CO2 22 - 32 mmol/L 26  27  28    Calcium  8.9 - 10.3 mg/dL 8.5  8.3  8.1   Total Protein 6.5 - 8.1 g/dL 6.8  6.4  6.9   Total Bilirubin 0.0 - 1.2 mg/dL 0.4  0.3  0.5   Alkaline Phos 38 - 126 U/L 55  47  51   AST 15 - 41 U/L 22  18  19    ALT 0 - 44 U/L 13  11  12       Lab Results  Component Value Date   TSH 4.34 04/22/2023   08/18/23 CT Angio Chest IMPRESSION: No evidence of pulmonary embolus. Patchy airspace disease throughout the left lung concerning for pneumonia. Aortic Atherosclerosis (ICD10-I70.0) and Emphysema  (ICD10-J43.9).  Assessment: Encounter Diagnoses  Name Primary?   Abdominal pain, epigastric Yes   Constipation, unspecified constipation type     83 year old female patient that presents for follow-up on chronic epigastric pain.  I recommended her daughter look at her medications at home to make sure she is taking the Pepcid  twice daily as prescribed.  I also recommended patient cut out coffee to see if this helps.  Patient has had issues with constipation and I instructed her to take the MiraLAX  daily versus as needed to see if this helps.   Plan: -Start OTC Miralax  po once daily for constipation -Continue Pepcid  1 tablet twice daily for reflux -Recommend GERD diet, no late meals 3-4 hours before lying down -Cut out coffee for two weeks and see if this helps reduce the pain. -Follow-up with cardiology, call their office for appt  Thank you for the courtesy of this consult. Please call me with any questions or concerns.   Cashlyn Huguley  Brandley Aldrete, FNP-C [facility] 09/01/2023, 3:36 PM  Cc: Dorrene Gaucher, NP

## 2023-09-01 NOTE — Patient Instructions (Addendum)
 Start OTC Miralax  po once daily for constipation Continue Pepcid  1 tablet twice daily for reflux Recommend GERD diet, no late meals 3-4 hours before lying down Cut out coffee for two weeks and see if this helps reduce the pain. Follow-up with cardiology, call their office for appt  _______________________________________________________  If your blood pressure at your visit was 140/90 or greater, please contact your primary care physician to follow up on this.  _______________________________________________________  If you are age 37 or older, your body mass index should be between 23-30. Your Body mass index is 33.17 kg/m. If this is out of the aforementioned range listed, please consider follow up with your Primary Care Provider.  If you are age 6 or younger, your body mass index should be between 19-25. Your Body mass index is 33.17 kg/m. If this is out of the aformentioned range listed, please consider follow up with your Primary Care Provider.   ________________________________________________________  The [facility] providers would like to encourage you to use MYCHART to communicate with providers for non-urgent requests or questions.  Due to long hold times on the telephone, sending your provider a message by [facility] may be a faster and more efficient way to get a response.  Please allow 48 business hours for a response.  Please remember that this is for non-urgent requests.  _______________________________________________________ Thank you for trusting me with your gastrointestinal care. Deanna May, RNP

## 2023-09-02 ENCOUNTER — Other Ambulatory Visit: Payer: Self-pay

## 2023-09-02 ENCOUNTER — Other Ambulatory Visit (HOSPITAL_COMMUNITY): Payer: Self-pay

## 2023-09-05 ENCOUNTER — Ambulatory Visit (INDEPENDENT_AMBULATORY_CARE_PROVIDER_SITE_OTHER): Admitting: Pharmacist

## 2023-09-05 ENCOUNTER — Other Ambulatory Visit (HOSPITAL_COMMUNITY): Payer: Self-pay

## 2023-09-05 ENCOUNTER — Other Ambulatory Visit: Payer: Self-pay | Admitting: Family

## 2023-09-05 ENCOUNTER — Telehealth: Payer: Self-pay | Admitting: Pharmacist

## 2023-09-05 ENCOUNTER — Telehealth: Payer: Self-pay | Admitting: Family

## 2023-09-05 DIAGNOSIS — Z79899 Other long term (current) drug therapy: Secondary | ICD-10-CM

## 2023-09-05 MED ORDER — DIAZEPAM 10 MG PO TABS
10.0000 mg | ORAL_TABLET | Freq: Two times a day (BID) | ORAL | 0 refills | Status: DC | PRN
  Filled 2023-09-05 – 2023-09-07 (×2): qty 60, 30d supply, fill #0

## 2023-09-05 NOTE — Telephone Encounter (Signed)
 Copied from CRM [id number]. Topic: Clinical - Medication Refill >> Sep 05, 2023  1:45 PM Tanazia G wrote: Medication:  diazepam  (VALIUM ) 10 MG tablet    Has the patient contacted their pharmacy? Yes (Agent: If no, request that the patient contact the pharmacy for the refill. If patient does not wish to contact the pharmacy document the reason why and proceed with request.) (Agent: If yes, when and what did the pharmacy advise?)  This is the patient's preferred pharmacy:  [facility] [address] [place] [address] Phone: [phone] Fax: [phone]   Is this the correct pharmacy for this prescription? Yes If no, delete pharmacy and type the correct one.   Has the prescription been filled recently? Yes  Is the patient out of the medication? Yes  Has the patient been seen for an appointment in the last year OR does the patient have an upcoming appointment? Yes  Can we respond through MyChart? Yes  Agent: Please be advised that Rx refills may take up to 3 business days. We ask that you follow-up with your pharmacy.

## 2023-09-05 NOTE — Telephone Encounter (Signed)
 Last Fill: 08/10/23 60 tabs/0 RF  Last OV: 08/18/23 Next OV: 09/16/23  Routing to provider for review/authorization.

## 2023-09-05 NOTE — Progress Notes (Signed)
 05/18/2023 Name: Veronica Wolf MRN: [id number] DOB: 12-21-1940 Chief Complaint  Patient presents with   Medication Management    Veronica Wolf is a 83 y.o. year old female who was referred for medication management by their primary care provider, Dorrene Gaucher, NP.       Subjective: Spoke to patient and her daughter today. Veronica Wolf will be staying with her daughter for about 2 more months while Veronica Wolf' home is being "worked on";   Patient was in hospital 08/18/2023 to 08/22/2023 for respiratory depression and pneumonia.  She completed antibiotic therapy with amoxicillin  and azithromycin .  Medication Access/Adherence  Current Pharmacy:  [facility] [address] [place] [address] Phone: [phone] Fax: [phone]  Patient reports affordability concerns with their medications: No  Patient reports access/transportation concerns to their pharmacy: medications are delivered to her - if needed her daughter can pick up medications Patient reports adherence concerns with their medications:  Yes    Current adherence strategy: uses adherence packaging. Last adherence packaging filled 08/12/2023 and started around 08/23/2023. She is due to have refills for most medications 09/12/2023 per [facility].   Packaging should have this schedule:  Morning 8am: allopurinol  100mg   - take 2 tablets, dicyclomine  10mg , famotidine  20mg , levothyroxine  25 mcg, sucralfate  1 gram Lunch- 12pm / noon:  potassium, ezetimibe , lisinopril , escitalopram , sucralfate  1 gram Supper 6pm - atorvastatin  80mg , isosorbide  Er 30mg , sucralfate  1 gram Night / 10pm: famotidine , dicyclomine , sucralfate  1 gram.   She takes the following medications which are not included in her packaged medication:  aspirin  81mg  daily at night, metoprolol  ER 25mg  0.5 tablet at night, diazepam  as needed and valacyclovir  every other night.  Patient keeps these medications by her  bed to take because they cannot be included in packaging   She did start Miralax  powder that GI recommended 09/01/2023. She reports that it helped a lot. She feels really good today and her belly does not feel as tight.   Objective:  Lab Results  Component Value Date   HGBA1C 6.8 (H) 04/22/2023    Lab Results  Component Value Date   CREATININE 1.55 (H) 08/22/2023   BUN 15 08/22/2023   NA 137 08/22/2023   K 4.6 08/22/2023   CL 100 08/22/2023   CO2 26 08/22/2023    Lab Results  Component Value Date   CHOL 242 (H) 04/22/2023   HDL 45.50 04/22/2023   LDLCALC 153 (H) 04/22/2023   TRIG 217.0 (H) 04/22/2023   CHOLHDL 5 04/22/2023    Medications Reviewed Today     Reviewed by Cecilie Coffee, RPH-CPP (Pharmacist) on 09/05/23 at 1629  Med List Status: <None>   Medication Order Taking? Sig Documenting Provider Last Dose Status Informant  albuterol  (PROVENTIL ) (2.5 MG/3ML) 0.083% nebulizer solution [id number]  USE 1 VIAL IN NEBULIZER EVERY 6 HOURS AS NEEDED FOR WHEEZING AND FOR SHORTNESS OF Arlan Labella, Lovett Ruck, NP  Active Child, Self  albuterol  (VENTOLIN  HFA) 108 (90 Base) MCG/ACT inhaler [id number]  Inhale 2 puffs into the lungs every 6 (six) hours as needed for wheezing or shortness of breath. Dorrene Gaucher, NP  Active Child, Self  allopurinol  (ZYLOPRIM ) 100 MG tablet [id number] Yes Take 2 tablets (200 mg total) by mouth daily. Dorrene Gaucher, NP Taking Active   aspirin  81 MG EC tablet [id number] Yes Take 1 tablet (81 mg total) by mouth daily. Swallow whole. Tobb, Kardie, DO Taking Active Child, Self  Med Note Alida Ion, Wynton Hufstetler B   Wed Oct 07, 2021 11:19 AM)    atorvastatin  (LIPITOR ) 80 MG tablet [id number] Yes Take 1 tablet (80 mg total) by mouth daily at 6 PM Dorrene Gaucher, NP Taking Active Child, Self  Blood Glucose Monitoring Suppl (ACCU-CHEK GUIDE ME) w/Device KIT [id number]  Use to test blood sugar in the morning, at noon, and at bedtime. Dorrene Gaucher, NP  Active Child, Self  clobetasol  ointment (TEMOVATE ) 0.05 % [id number] Yes Apply 1 Application topically 2 (two) times daily. For up to 2 weeks. Dorrene Gaucher, NP Taking Active Child, Self  colchicine  0.6 MG tablet [id number]  Take 0.6 mg by mouth daily as needed (gout). [provider]  Active Child, Self  diazepam  (VALIUM ) 10 MG tablet [id number] Yes Take 1 tablet (10 mg total) by mouth every 12 (twelve) hours as needed for anxiety.  Patient taking differently: Take 10 mg by mouth every 12 (twelve) hours as needed for anxiety or sleep.   Dorrene Gaucher, NP Taking Active Child, Self  dicyclomine  (BENTYL ) 10 MG capsule [id number] Yes Take 1 capsule (10 mg total) by mouth 2 (two) times daily. Arlee Bellows, NP Taking Active Child, Self  escitalopram  (LEXAPRO ) 20 MG tablet [id number] Yes Take 1 tablet (20 mg total) by mouth daily with lunch Dorrene Gaucher, NP Taking Active Child, Self  ezetimibe  (ZETIA ) 10 MG tablet [id number] Yes Take 1 tablet (10 mg total) by mouth daily with lunch. Dorrene Gaucher, NP Taking Active   famotidine  (PEPCID ) 20 MG tablet [id number] Yes Take 1 tablet (20 mg total) by mouth in the morning and at bedtime. Dorrene Gaucher, NP Taking Active Child, Self  furosemide  (LASIX ) 40 MG tablet [id number] Yes Take 0.5 tablets (20 mg total) by mouth daily as needed. Take only for swelling or weight gain (2 lbs in a day or 5 lbs in a week).  Call your PCP for follow up if you are using this frequently. Etter Hermann., MD Taking Active   ibuprofen (ADVIL) 200 MG tablet [id number]  Take 200 mg by mouth every 6 (six) hours as needed for mild pain (pain score 1-3). [provider]  Active Child, Self  isosorbide  mononitrate (IMDUR ) 60 MG 24 hr tablet [id number] Yes Take 1 tablet (60 mg total) by mouth daily at 6 PM. Dorrene Gaucher, NP Taking Active   levothyroxine  (SYNTHROID ) 25 MCG tablet [id number] Yes Take 1 tablet (25 mcg total) by  mouth before breakfast. Neda Balk, MD Taking Active Child, Self  lisinopril  (ZESTRIL ) 2.5 MG tablet [id number] Yes Take 1 tablet (2.5 mg total) by mouth daily with lunch. Dorrene Gaucher, NP Taking Active   meclizine  (ANTIVERT ) 25 MG tablet [id number]  Take 1 tablet (25 mg total) by mouth 3 (three) times daily as needed for dizziness. Dorrene Gaucher, NP  Active Child, Self  metoprolol  succinate (TOPROL -XL) 25 MG 24 hr tablet [id number] Yes Take 0.5 tablets (12.5 mg total) by mouth every evening. Dorrene Gaucher, NP Taking Active Child, Self           Med Note Alida Ion, Serene Kopf B   Thu May 19, 2023 12:52 PM)    nitroGLYCERIN  (NITROSTAT ) 0.4 MG SL tablet [id number] Yes Place 1 tablet (0.4 mg total) under the tongue every 5 (five) minutes as needed for chest pain. Dorrene Gaucher, NP Taking Active   nystatin  (MYCOSTATIN /NYSTOP ) powder [id number] Yes Apply 1 Application topically to the affected area(s) 2 (two) times daily.  Patient taking differently: Apply 1 Application topically  2 (two) times daily as needed (for dryness).   Dorrene Gaucher, NP Taking Active Child, Self  polyethylene glycol powder (GLYCOLAX /MIRALAX ) 17 GM/SCOOP powder [id number] Yes Take 17 g by mouth daily. [provider] Taking Active   potassium chloride  SA (KLOR-CON  M) 20 MEQ tablet [id number] Yes Take 1 tablet (20 mEq total) by mouth daily at 12 noon. Dorrene Gaucher, NP Taking Active Child, Self  Probiotic Product (PROBIOTIC DAILY) CAPS [id number]  Take 1 tablet by mouth daily. Everlina Hock, NP  Active Child, Self  Respiratory Therapy Supplies (NEBULIZER/TUBING/MOUTHPIECE) KIT [id number]  Use as directed Dorrene Gaucher, NP  Active Child, Self  senna (SENOKOT) 8.6 MG TABS tablet [id number]  Take 1 tablet (8.6 mg total) by mouth at bedtime.  Patient taking differently: Take 1 tablet by mouth daily as needed for mild constipation.   Dorrene Gaucher, NP  Active Child, Self  sucralfate   (CARAFATE ) 1 g tablet [id number] Yes Take 1 tablet (1 g total) by mouth 4 (four) times daily -  with meals and at bedtime. Take one hour prior to morning medications. Dorrene Gaucher, NP Taking Active   valACYclovir  (VALTREX ) 500 MG tablet [id number] Yes Take 1 tablet (500 mg total) by mouth every other day.  Patient taking differently: Take 500 mg by mouth daily as needed (for fever blisters).   Dorrene Gaucher, NP Taking Active Child, Self              Assessment/Plan:   Medication Management: - Reviewed  medications and packaging with patient's daughter.  - Pharmacy will be working on her adherence packaging. It should be delivered to her daughter's address. - Requested RF for diazepam  from her PCP.  - Continue to use Miralax  as directed for constipation.   Follow Up Plan: 2 to 4 weeks.   Cecilie Coffee, PharmD Clinical Pharmacist [facility]

## 2023-09-05 NOTE — Telephone Encounter (Signed)
 Copied from CRM [id number]. Topic: Clinical - Medication Refill >> Sep 05, 2023  1:52 PM Tanazia G wrote: Medication:  diazepam  (VALIUM ) 10 MG tablet    Has the patient contacted their pharmacy? Yes (Agent: If no, request that the patient contact the pharmacy for the refill. If patient does not wish to contact the pharmacy document the reason why and proceed with request.) (Agent: If yes, when and what did the pharmacy advise?)   [id number] - [place] - [address] [place] [address] Phone: [phone] Fax: [phone]  Is this the correct pharmacy for this prescription? Yes If no, delete pharmacy and type the correct one.   Has the prescription been filled recently? Yes  Is the patient out of the medication? Yes  Has the patient been seen for an appointment in the last year OR does the patient have an upcoming appointment? Yes  Can we respond through MyChart? Yes  Agent: Please be advised that Rx refills may take up to 3 business days. We ask that you follow-up with your pharmacy.

## 2023-09-05 NOTE — Telephone Encounter (Signed)
 Patient is requesting refill on diazepam  tablets.  Last refilled for #60 - take 1 tablet every 12 hours as needed for anxiety Last date refilled 08/10/2023  [facility]

## 2023-09-05 NOTE — Telephone Encounter (Signed)
 Previous encounter updated with appropriate pharmacy. Duplicate encounter.

## 2023-09-06 ENCOUNTER — Other Ambulatory Visit: Payer: Self-pay

## 2023-09-06 ENCOUNTER — Other Ambulatory Visit (HOSPITAL_COMMUNITY): Payer: Self-pay

## 2023-09-07 ENCOUNTER — Other Ambulatory Visit: Payer: Self-pay

## 2023-09-07 ENCOUNTER — Other Ambulatory Visit (HOSPITAL_COMMUNITY): Payer: Self-pay

## 2023-09-08 ENCOUNTER — Other Ambulatory Visit (HOSPITAL_COMMUNITY): Payer: Self-pay

## 2023-09-08 ENCOUNTER — Other Ambulatory Visit: Payer: Self-pay

## 2023-09-09 ENCOUNTER — Other Ambulatory Visit (HOSPITAL_COMMUNITY): Payer: Self-pay

## 2023-09-09 ENCOUNTER — Other Ambulatory Visit (HOSPITAL_BASED_OUTPATIENT_CLINIC_OR_DEPARTMENT_OTHER): Payer: Self-pay

## 2023-09-16 ENCOUNTER — Inpatient Hospital Stay: Admitting: Family

## 2023-09-24 ENCOUNTER — Other Ambulatory Visit (HOSPITAL_COMMUNITY): Payer: Self-pay

## 2023-09-28 ENCOUNTER — Telehealth: Payer: Self-pay | Admitting: Pharmacist

## 2023-09-28 ENCOUNTER — Telehealth

## 2023-09-28 NOTE — Telephone Encounter (Signed)
 Attempt was made to contact patient by phone today for follow up by Clinical Pharmacist regarding medication management.  Unable to reach patient. LM on VM with my contact number [phone].   Tried again to call phone number listed for her home this time but no answer and unable to leave a message.

## 2023-09-30 ENCOUNTER — Other Ambulatory Visit: Payer: Self-pay

## 2023-09-30 ENCOUNTER — Other Ambulatory Visit: Payer: Self-pay | Admitting: Family

## 2023-09-30 ENCOUNTER — Other Ambulatory Visit (HOSPITAL_COMMUNITY): Payer: Self-pay

## 2023-09-30 MED ORDER — DICYCLOMINE HCL 10 MG PO CAPS
10.0000 mg | ORAL_CAPSULE | Freq: Two times a day (BID) | ORAL | 5 refills | Status: DC
  Filled 2023-09-30 – 2023-10-06 (×3): qty 60, 30d supply, fill #0
  Filled 2023-11-08 (×2): qty 60, 30d supply, fill #1
  Filled 2023-12-06 – 2023-12-12 (×3): qty 60, 30d supply, fill #2
  Filled 2024-01-04: qty 60, 30d supply, fill #3
  Filled 2024-01-31: qty 60, 30d supply, fill #4
  Filled 2024-02-24 – 2024-02-28 (×2): qty 60, 30d supply, fill #5

## 2023-09-30 MED ORDER — FAMOTIDINE 20 MG PO TABS
20.0000 mg | ORAL_TABLET | Freq: Two times a day (BID) | ORAL | 0 refills | Status: DC
Start: 2023-09-30 — End: 2024-01-04
  Filled 2023-09-30: qty 180, 90d supply, fill #0
  Filled 2023-10-06 (×2): qty 60, 30d supply, fill #0
  Filled 2023-11-08 (×2): qty 60, 30d supply, fill #1
  Filled 2023-12-06 – 2023-12-12 (×3): qty 60, 30d supply, fill #2

## 2023-10-03 ENCOUNTER — Other Ambulatory Visit: Payer: Self-pay

## 2023-10-06 ENCOUNTER — Other Ambulatory Visit: Payer: Self-pay

## 2023-10-10 ENCOUNTER — Other Ambulatory Visit: Payer: Self-pay

## 2023-10-10 ENCOUNTER — Telehealth: Payer: Self-pay | Admitting: Pharmacist

## 2023-10-10 ENCOUNTER — Other Ambulatory Visit

## 2023-10-10 NOTE — Telephone Encounter (Signed)
 Attempt was made to contact patient by phone today for follow up by Clinical Pharmacist regarding medication management / adherence packaging..  Unable to reach patient. LM on VM of her daughter [name] with my contact number [phone].  ALso tried to reach patient with her number but no answer and no VM to LM.

## 2023-10-11 NOTE — Progress Notes (Signed)
 Agree with the assessment and plan as outlined by [facility], FNP-C.  Carlitos Bottino, DO, [facility]

## 2023-10-12 ENCOUNTER — Other Ambulatory Visit (HOSPITAL_COMMUNITY): Payer: Self-pay

## 2023-10-12 ENCOUNTER — Other Ambulatory Visit: Payer: Self-pay

## 2023-10-12 ENCOUNTER — Other Ambulatory Visit: Payer: Self-pay | Admitting: Family

## 2023-10-12 MED ORDER — DIAZEPAM 10 MG PO TABS
10.0000 mg | ORAL_TABLET | Freq: Two times a day (BID) | ORAL | 0 refills | Status: DC | PRN
  Filled 2023-10-12: qty 60, 30d supply, fill #0

## 2023-10-12 NOTE — Telephone Encounter (Signed)
 Copied from CRM [id number]. Topic: Clinical - Medication Refill >> Oct 12, 2023 12:54 PM Adrionna Y wrote: Medication: diazepam  (VALIUM ) 10 MG tablet   Has the patient contacted their pharmacy? Yes (Agent: If no, request that the patient contact the pharmacy for the refill. If patient does not wish to contact the pharmacy document the reason why and proceed with request.) (Agent: If yes, when and what did the pharmacy advise?)  This is the patient's preferred pharmacy:  [place] [facility] [address] [place] [address] Phone: [phone] Fax: [phone]     Is this the correct pharmacy for this prescription? Yes If no, delete pharmacy and type the correct one.   Has the prescription been filled recently? Yes  Is the patient out of the medication? Yes  Has the patient been seen for an appointment in the last year OR does the patient have an upcoming appointment? Yes  Can we respond through MyChart? No  Agent: Please be advised that Rx refills may take up to 3 business days. We ask that you follow-up with your pharmacy.

## 2023-10-12 NOTE — Telephone Encounter (Signed)
 Requesting: diazepam   Contract:07/26/22 UDS: 07/26/22 Last Visit: 07/19/23 Next Visit: None Last Refill: 09/07/23 #60 and 0RF  Please Advise

## 2023-10-13 ENCOUNTER — Other Ambulatory Visit: Payer: Self-pay

## 2023-10-14 ENCOUNTER — Other Ambulatory Visit: Payer: Self-pay

## 2023-10-19 ENCOUNTER — Telehealth: Payer: Self-pay

## 2023-10-19 NOTE — Progress Notes (Signed)
 Complex Care Management Note Care Guide Note  10/19/2023 Name: Veronica Wolf MRN: [id number] DOB: Aug 14, 1940   Complex Care Management Outreach Attempts: An unsuccessful telephone outreach was attempted today to offer the patient information about available complex care management services.  Follow Up Plan:  Additional outreach attempts will be made to offer the patient complex care management information and services.   Encounter Outcome:  No Answer  Dream[facility]  [facility], [facility] Health Care Management Assistant Direct Dial: [phone]  Fax: [phone]

## 2023-10-20 ENCOUNTER — Ambulatory Visit: Payer: Self-pay

## 2023-10-20 ENCOUNTER — Ambulatory Visit: Admitting: Family

## 2023-10-20 ENCOUNTER — Encounter: Payer: Self-pay | Admitting: Family

## 2023-10-20 VITALS — BP 128/78 | HR 83 | Ht 64.0 in | Wt 196.0 lb

## 2023-10-20 DIAGNOSIS — R42 Dizziness and giddiness: Secondary | ICD-10-CM | POA: Diagnosis not present

## 2023-10-20 DIAGNOSIS — E119 Type 2 diabetes mellitus without complications: Secondary | ICD-10-CM

## 2023-10-20 DIAGNOSIS — H81399 Other peripheral vertigo, unspecified ear: Secondary | ICD-10-CM

## 2023-10-20 NOTE — Telephone Encounter (Signed)
 Reason for Disposition . [1] Age over 64 years AND [2] swelling or bruise  Protocols used: Head Injury-A-AH

## 2023-10-20 NOTE — Addendum Note (Signed)
 Addended by: TRUDY CURVIN RAMAN on: 10/20/2023 05:07 PM   Modules accepted: Orders

## 2023-10-20 NOTE — Progress Notes (Signed)
 Veronica Wolf is a 83 y.o. female with the following history as recorded in EpicCare:  Patient Active Problem List   Diagnosis Date Noted   Acute hypoxemic respiratory failure (HCC) 08/18/2023   Generalized abdominal pain 07/05/2023   Dysuria 07/05/2023   Chronic upper abdominal pain 03/21/2023   Herpes simplex type 2 infection 11/17/2022   Constipation 09/08/2022   Elevated prolactin level 02/10/2022   Galactorrhea 02/05/2022   Controlled type 2 diabetes mellitus with complication, without long-term current use of insulin  (HCC) 02/05/2022   Pulmonary hypertension, unspecified (HCC) 09/17/2021   Morbid obesity (HCC) 09/17/2021   Acute vaginitis 09/14/2021   Atypical chest pain 08/26/2021   Memory loss 08/26/2021   Cough 04/21/2021   Back pain 12/03/2020   Grief reaction 12/03/2020   Fatty liver 12/01/2020   Intermittent left-sided chest pain 09/17/2020   Coronary artery disease involving native coronary artery of native heart 09/17/2020   Metabolic syndrome 09/17/2020   Allergy 09/16/2020   Anxiety 09/16/2020   Hypertension 09/16/2020   Vertigo 09/16/2020   Dark stools 09/08/2020   Dysphagia    Esophageal stricture    Hiatal hernia    Epigastric pain    Hypothyroid 01/25/2018   Mild CAD 10/03/2017   Stress-induced cardiomyopathy 09/19/2017   COPD exacerbation (HCC) 09/14/2017   Hypokalemia 09/14/2017   Non-ST elevation (NSTEMI) myocardial infarction [facility]) - due to Demand Infarction 09/14/2017   CKD (chronic kidney disease) 04/04/2017   HLD (hyperlipidemia)    Allergic rhinitis    Gout 02/28/2017   Depression with anxiety 02/28/2017   COPD with chronic bronchitis (HCC) 12/22/2016   GERD (gastroesophageal reflux disease) 12/22/2016   DOE (dyspnea on exertion) 12/22/2016   Peptic ulcer 01/04/2003    Current Outpatient Medications  Medication Sig Dispense Refill   albuterol  (PROVENTIL ) (2.5 MG/3ML) 0.083% nebulizer solution USE 1 VIAL IN NEBULIZER EVERY 6 HOURS AS  NEEDED FOR WHEEZING AND FOR SHORTNESS OF BREATH 150 mL 0   albuterol  (VENTOLIN  HFA) 108 (90 Base) MCG/ACT inhaler Inhale 2 puffs into the lungs every 6 (six) hours as needed for wheezing or shortness of breath. 6.7 g 2   allopurinol  (ZYLOPRIM ) 100 MG tablet Take 2 tablets (200 mg total) by mouth daily. 180 tablet 0   aspirin  81 MG EC tablet Take 1 tablet (81 mg total) by mouth daily. Swallow whole. 30 tablet 12   atorvastatin  (LIPITOR ) 80 MG tablet Take 1 tablet (80 mg total) by mouth daily at 6 PM 90 tablet 1   Blood Glucose Monitoring Suppl (ACCU-CHEK GUIDE ME) w/Device KIT Use to test blood sugar in the morning, at noon, and at bedtime. 1 kit 0   clobetasol  ointment (TEMOVATE ) 0.05 % Apply 1 Application topically 2 (two) times daily. For up to 2 weeks. 30 g 0   colchicine  0.6 MG tablet Take 0.6 mg by mouth daily as needed (gout).     diazepam  (VALIUM ) 10 MG tablet Take 1 tablet (10 mg total) by mouth every 12 (twelve) hours as needed for anxiety. 60 tablet 0   dicyclomine  (BENTYL ) 10 MG capsule Take 1 capsule (10 mg total) by mouth 2 (two) times daily. 60 capsule 5   escitalopram  (LEXAPRO ) 20 MG tablet Take 1 tablet (20 mg total) by mouth daily with lunch 90 tablet 1   ezetimibe  (ZETIA ) 10 MG tablet Take 1 tablet (10 mg total) by mouth daily with lunch. 90 tablet 0   famotidine  (PEPCID ) 20 MG tablet Take 1 tablet (20 mg total) by  mouth in the morning and at bedtime. 180 tablet 0   furosemide  (LASIX ) 40 MG tablet Take 0.5 tablets (20 mg total) by mouth daily as needed. Take only for swelling or weight gain (2 lbs in a day or 5 lbs in a week).  Call your PCP for follow up if you are using this frequently. 30 tablet 0   ibuprofen (ADVIL) 200 MG tablet Take 200 mg by mouth every 6 (six) hours as needed for mild pain (pain score 1-3).     isosorbide  mononitrate (IMDUR ) 60 MG 24 hr tablet Take 1 tablet (60 mg total) by mouth daily at 6 PM. 90 tablet 0   levothyroxine  (SYNTHROID ) 25 MCG tablet Take 1  tablet (25 mcg total) by mouth before breakfast. 90 tablet 1   lisinopril  (ZESTRIL ) 2.5 MG tablet Take 1 tablet (2.5 mg total) by mouth daily with lunch. 90 tablet 0   meclizine  (ANTIVERT ) 25 MG tablet Take 1 tablet (25 mg total) by mouth 3 (three) times daily as needed for dizziness. 90 tablet 0   metoprolol  succinate (TOPROL -XL) 25 MG 24 hr tablet Take 0.5 tablets (12.5 mg total) by mouth every evening. 45 tablet 1   nitroGLYCERIN  (NITROSTAT ) 0.4 MG SL tablet Place 1 tablet (0.4 mg total) under the tongue every 5 (five) minutes as needed for chest pain. 45 tablet 3   nystatin  (MYCOSTATIN /NYSTOP ) powder Apply 1 Application topically to the affected area(s) 2 (two) times daily. (Patient taking differently: Apply 1 Application topically 2 (two) times daily as needed (for dryness).) 60 g 4   polyethylene glycol powder (GLYCOLAX /MIRALAX ) 17 GM/SCOOP powder Take 17 g by mouth daily.     potassium chloride  SA (KLOR-CON  M) 20 MEQ tablet Take 1 tablet (20 mEq total) by mouth daily at 12 noon. 90 tablet 1   Probiotic Product (PROBIOTIC DAILY) CAPS Take 1 tablet by mouth daily. 90 capsule 0   Respiratory Therapy Supplies (NEBULIZER/TUBING/MOUTHPIECE) KIT Use as directed 1 kit 0   senna (SENOKOT) 8.6 MG TABS tablet Take 1 tablet (8.6 mg total) by mouth at bedtime. (Patient taking differently: Take 1 tablet by mouth daily as needed for mild constipation.)     sucralfate  (CARAFATE ) 1 g tablet Take 1 tablet (1 g total) by mouth 4 (four) times daily -  with meals and at bedtime. Take one hour prior to morning medications. 120 tablet 1   valACYclovir  (VALTREX ) 500 MG tablet Take 1 tablet (500 mg total) by mouth every other day. (Patient taking differently: Take 500 mg by mouth daily as needed (for fever blisters).) 45 tablet 1   No current facility-administered medications for this visit.    Allergies: Ibuprofen  Past Medical History:  Diagnosis Date   Allergic rhinitis    Allergy    seasonal allergies    Anxiety    CKD (chronic kidney disease) 04/04/2017   COPD (chronic obstructive pulmonary disease) (HCC)    uses inhaler   Depression    Fatty liver    GERD (gastroesophageal reflux disease)    Gout    hx of   Hyperlipidemia    on meds   Hypertension    on meds   Myocardial infarction [facility])    Peptic ulcer 01/04/2003   Vertigo     Past Surgical History:  Procedure Laterality Date   ABDOMINAL HYSTERECTOMY     APPENDECTOMY     BIOPSY  08/13/2020   Procedure: BIOPSY;  Surgeon: San Sandor GAILS, DO;  Location: [facility];  Service:  Gastroenterology;;   CORONARY PRESSURE/FFR STUDY N/A 01/27/2022   Procedure: INTRAVASCULAR PRESSURE WIRE/FFR STUDY;  Surgeon: Wendel Lurena POUR, MD;  Location: [facility] CV [facility];  Service: Cardiovascular;  Laterality: N/A;   ESOPHAGOGASTRODUODENOSCOPY (EGD) WITH PROPOFOL  N/A 08/13/2020   Procedure: ESOPHAGOGASTRODUODENOSCOPY (EGD) WITH PROPOFOL ;  Surgeon: San Sandor GAILS, DO;  Location: [facility];  Service: Gastroenterology;  Laterality: N/A;   LEFT HEART CATH AND CORONARY ANGIOGRAPHY N/A 06/13/2017   Procedure: LEFT HEART CATH AND CORONARY ANGIOGRAPHY;  Surgeon: Anner Alm ORN, MD;  Location: [facility] INVASIVE CV LAB;  Service: Cardiovascular;  Laterality: N/A;   LEFT HEART CATH AND CORONARY ANGIOGRAPHY N/A 01/27/2022   Procedure: LEFT HEART CATH AND CORONARY ANGIOGRAPHY;  Surgeon: Wendel Lurena POUR, MD;  Location: [facility] CV LAB;  Service: Cardiovascular;  Laterality: N/A;   RIGHT/LEFT HEART CATH AND CORONARY ANGIOGRAPHY N/A 09/16/2017   Procedure: RIGHT/LEFT HEART CATH AND CORONARY ANGIOGRAPHY;  Surgeon: Swaziland, Peter M, MD;  Location: [facility] INVASIVE CV LAB;  Service: Cardiovascular;  Laterality: N/A;   SAVORY DILATION N/A 08/13/2020   Procedure: SAVORY DILATION;  Surgeon: San Sandor GAILS, DO;  Location: [facility];  Service: Gastroenterology;  Laterality: N/A;   ULTRASOUND GUIDANCE FOR VASCULAR ACCESS  09/16/2017   Procedure: Ultrasound Guidance For  Vascular Access;  Surgeon: Swaziland, Peter M, MD;  Location: [facility] INVASIVE CV LAB;  Service: Cardiovascular;;   WISDOM TOOTH EXTRACTION      Family History  Problem Relation Age of Onset   Breast cancer Mother    Stroke Father    Stomach cancer Neg Hx    Colon cancer Neg Hx    Pancreatic cancer Neg Hx    Esophageal cancer Neg Hx    Colon polyps Neg Hx    Rectal cancer Neg Hx     Social History   Tobacco Use   Smoking status: Former    Current packs/day: 0.00    Types: Cigarettes    Quit date: 04/24/2000    Years since quitting: 23.5    Passive exposure: Past   Smokeless tobacco: Never  Substance Use Topics   Alcohol use: Not Currently    Comment: has previous hx of ETOH abuse, quit 2014    Subjective:  Accompanied by daughter; rolled on her side and out of her bed last week and did hit her head on her bedside table; did not lose consciousness/ no vision changes/ no vomiting; appetite is normal; has noticed some dizziness with changing positions for the past week- no chest pain or shortness of breath on exertion;      Objective:  Vitals:   10/20/23 1540  BP: 128/78  Pulse: 83  SpO2: 96%  Weight: 196 lb (88.9 kg)  Height: 5' 4 (1.626 m)    General: Well developed, well nourished, in no acute distress  Skin : Warm and dry.  Head: Normocephalic and atraumatic  Lungs: Respirations unlabored; clear to auscultation bilaterally without wheeze, rales, rhonchi  CVS exam: normal rate and regular rhythm.  Abdomen: Soft; nontender; nondistended; normoactive bowel sounds; no masses or hepatosplenomegaly  Musculoskeletal: No deformities; no active joint inflammation  Extremities: No edema, cyanosis, clubbing  Vessels: Symmetric bilaterally  Neurologic: Alert and oriented; speech intact; face symmetrical; moves all extremities well; CNII-XII intact without focal deficit   Assessment:  1. Dizziness   2. Other peripheral vertigo, unspecified ear   3. Diet-controlled diabetes  mellitus (HCC)     Plan:  ? Etiology; EKG shows sinus rhythm- no change from previous tracings;  will update CXR and head CT; update labs today- ? Atypical presentation of UTI; follow up to be determined;   No follow-ups on file.  Orders Placed This Encounter  Procedures   Urine Culture   DG Chest 2 View    Standing Status:   Future    Expiration Date:   10/19/2024    Reason for Exam (SYMPTOM  OR DIAGNOSIS REQUIRED):   dizziness    Preferred imaging location?:   [facility]   CT HEAD WO CONTRAST ( )    Standing Status:   Future    Expiration Date:   10/19/2024    Preferred imaging location?:   [facility]   CBC with Differential/Platelet   Comp Met (CMET)   Urinalysis   Hemoglobin A1c   EKG 12-Lead    Requested Prescriptions    No prescriptions requested or ordered in this encounter

## 2023-10-20 NOTE — Addendum Note (Signed)
 Addended by: TRUDY CURVIN RAMAN on: 10/20/2023 05:01 PM   Modules accepted: Orders

## 2023-10-20 NOTE — Telephone Encounter (Addendum)
 FYI Only or Action Required?: FYI only for provider.  Patient was last seen in primary care on 07/19/2023 by Daryl Setter, NP.  Called Nurse Triage reporting Head Injury.  Symptoms began a week ago.  Interventions attempted: Rest, hydration, or home remedies.  Symptoms are: gradually improving.  Triage Disposition: See HCP Within 4 Hours (Or PCP Triage)  Patient/caregiver understands and will follow disposition?: Yes    Copied from CRM [id number]. Topic: Clinical - Red Word Triage >> Oct 20, 2023 10:45 AM Rea BROCKS wrote: Red Word that prompted transfer to Nurse Triage: Patient fell and struck head about a week ago. Since then, she's been having dizzy spells.     Answer Assessment - Initial Assessment Questions 1. MECHANISM: How did the injury happen? For falls, ask: What height did you fall from? and What surface did you fall against?      Fell onto bed and hit head on dresser  2. ONSET: When did the injury happen? (e.g., minutes, hours ago)      1 week  3. NEUROLOGIC SYMPTOMS: Was there any loss of consciousness? Are there any other neurological symptoms?      Dizziness  4. MENTAL STATUS: Does the person know who they are, who you are, and where they are?      Alert and oriented  5. LOCATION: What part of the head was hit?      Left side above the eye  6. SCALP APPEARANCE: What does the scalp look like? Is it bleeding now? If Yes, ask: Is it difficult to stop?      Small bump that has resolved  7. SIZE: For cuts, bruises, or swelling, ask: How large is it? (e.g., inches or centimeters)      No bruising  8. PAIN: Is there any pain? If Yes, ask: How bad is it? (Scale 0-10; or none, mild, moderate, severe)     No 9. TETANUS: For any breaks in the skin, ask: When was your last tetanus booster?     N/A 10. BLOOD THINNERS: Do you take any blood thinners? (e.g., aspirin , clopidogrel / Plavix, coumadin, heparin ). Notes: Other strong blood thinners  include: Arixtra (fondaparinux), Eliquis (apixaban), Pradaxa (dabigatran), and Xarelto (rivaroxaban).       No 11. OTHER SYMPTOMS: Do you have any other symptoms? (e.g., neck pain, vomiting)       No  Protocols used: Head Injury-A-AH

## 2023-10-21 ENCOUNTER — Ambulatory Visit (HOSPITAL_BASED_OUTPATIENT_CLINIC_OR_DEPARTMENT_OTHER)
Admission: RE | Admit: 2023-10-21 | Discharge: 2023-10-21 | Disposition: A | Discharge status: Home / Self Care | Source: Ambulatory Visit | Attending: Family | Admitting: Family

## 2023-10-21 ENCOUNTER — Ambulatory Visit: Payer: Self-pay | Admitting: Family

## 2023-10-21 ENCOUNTER — Other Ambulatory Visit

## 2023-10-21 DIAGNOSIS — I6782 Cerebral ischemia: Secondary | ICD-10-CM | POA: Diagnosis not present

## 2023-10-21 DIAGNOSIS — R42 Dizziness and giddiness: Secondary | ICD-10-CM

## 2023-10-21 DIAGNOSIS — H81399 Other peripheral vertigo, unspecified ear: Secondary | ICD-10-CM | POA: Diagnosis not present

## 2023-10-21 LAB — CBC WITH DIFFERENTIAL/PLATELET
Basophils Absolute: 0.1 K/uL (ref 0.0–0.1)
Basophils Relative: 1.2 % (ref 0.0–3.0)
Eosinophils Absolute: 0.3 K/uL (ref 0.0–0.7)
Eosinophils Relative: 2.7 % (ref 0.0–5.0)
HCT: 38.8 % (ref 36.0–46.0)
Hemoglobin: 12.3 g/dL (ref 12.0–15.0)
Lymphocytes Relative: 26.3 % (ref 12.0–46.0)
Lymphs Abs: 3 K/uL (ref 0.7–4.0)
MCHC: 31.7 g/dL (ref 30.0–36.0)
MCV: 89.8 fl (ref 78.0–100.0)
Monocytes Absolute: 0.7 K/uL (ref 0.1–1.0)
Monocytes Relative: 5.9 % (ref 3.0–12.0)
Neutro Abs: 7.3 K/uL (ref 1.4–7.7)
Neutrophils Relative %: 63.9 % (ref 43.0–77.0)
Platelets: 193 K/uL (ref 150.0–400.0)
RBC: 4.32 Mil/uL (ref 3.87–5.11)
RDW: 15.6 % — ABNORMAL HIGH (ref 11.5–15.5)
WBC: 11.4 K/uL — ABNORMAL HIGH (ref 4.0–10.5)

## 2023-10-21 LAB — COMPREHENSIVE METABOLIC PANEL WITH GFR
ALT: 7 U/L (ref 0–35)
AST: 14 U/L (ref 0–37)
Albumin: 3.9 g/dL (ref 3.5–5.2)
Alkaline Phosphatase: 67 U/L (ref 39–117)
BUN: 15 mg/dL (ref 6–23)
CO2: 25 meq/L (ref 19–32)
Calcium: 9 mg/dL (ref 8.4–10.5)
Chloride: 105 meq/L (ref 96–112)
Creatinine, Ser: 1.26 mg/dL — ABNORMAL HIGH (ref 0.40–1.20)
GFR: 39.25 mL/min — ABNORMAL LOW (ref >60.00)
Glucose, Bld: 94 mg/dL (ref 70–99)
Potassium: 4.8 meq/L (ref 3.5–5.1)
Sodium: 138 meq/L (ref 135–145)
Total Bilirubin: 0.3 mg/dL (ref 0.2–1.2)
Total Protein: 7.2 g/dL (ref 6.0–8.3)

## 2023-10-21 LAB — HEMOGLOBIN A1C: Hgb A1c MFr Bld: 6.5 % (ref 4.6–6.5)

## 2023-10-23 LAB — URINE CULTURE
MICRO NUMBER:: 16717691
SPECIMEN QUALITY:: ADEQUATE

## 2023-10-24 ENCOUNTER — Other Ambulatory Visit: Payer: Self-pay | Admitting: Family Medicine

## 2023-10-24 ENCOUNTER — Other Ambulatory Visit: Payer: Self-pay

## 2023-10-24 ENCOUNTER — Other Ambulatory Visit: Payer: Self-pay | Admitting: Family

## 2023-10-24 ENCOUNTER — Other Ambulatory Visit (HOSPITAL_COMMUNITY): Payer: Self-pay

## 2023-10-24 LAB — URINALYSIS, ROUTINE W REFLEX MICROSCOPIC
Bilirubin Urine: NEGATIVE
Hgb urine dipstick: NEGATIVE
Ketones, ur: NEGATIVE
Nitrite: NEGATIVE
Specific Gravity, Urine: 1.02 (ref 1.000–1.030)
Total Protein, Urine: NEGATIVE
Urine Glucose: NEGATIVE
Urobilinogen, UA: 0.2 (ref 0.0–1.0)
pH: 6 (ref 5.0–8.0)

## 2023-10-24 NOTE — Telephone Encounter (Signed)
 Refill too soon

## 2023-10-24 NOTE — Addendum Note (Signed)
 Addended by: TRUDY CURVIN RAMAN on: 10/24/2023 09:57 AM   Modules accepted: Orders

## 2023-10-25 ENCOUNTER — Telehealth: Payer: Self-pay

## 2023-10-25 ENCOUNTER — Other Ambulatory Visit (HOSPITAL_COMMUNITY): Payer: Self-pay

## 2023-10-25 NOTE — Telephone Encounter (Signed)
 Patient's medication adherence box come from the [facility] pharmacy. She should contact them regarding her refills.  It looks like all of her medications were filled 10/10/2023 or 10/12/2023. She should have several days of her adherence packaging or box remaining.  Tried to call patient's mobile number listed in chart - mailbox was full. Sent SMS message with my phone number [phone].  Tried to call patient's home number no answer.   Patient was staying with her daughter and there was a temporary address but it looks like patients address is now on her communication records. Will have adherence packaging team check to see where last adherence packs were delivery and reach out to patient if needed.   Patient has appt with me 10/10/2023 but did not answer phone. Scheduler has tried to outreach x 3 to reschedule but has not been successful.

## 2023-10-25 NOTE — Telephone Encounter (Signed)
 Copied from CRM [id number]. Topic: General - Other >> Oct 25, 2023 10:38 AM Mercedes MATSU wrote: Reason for CRM: Patient called in requesting a call back from [facility]. She states that she has not received her box of medication that tammy was sending. Patient can be reached at [phone]

## 2023-10-25 NOTE — Progress Notes (Signed)
 Complex Care Management Note Care Guide Note  10/25/2023 Name: Veronica Wolf MRN: [id number] DOB: 02/17/1941   Complex Care Management Outreach Attempts: A second unsuccessful outreach was attempted today to offer the patient with information about available complex care management services.  Follow Up Plan:  No further outreach attempts will be made at this time. We have been unable to contact the patient to offer or enroll patient in complex care management services.  Encounter Outcome:  No Answer  Dreama [facility]  [facility] Health Care Management Assistant Direct Dial: [phone]  Fax: [phone]

## 2023-10-26 ENCOUNTER — Other Ambulatory Visit (HOSPITAL_COMMUNITY): Payer: Self-pay

## 2023-10-26 NOTE — Telephone Encounter (Unsigned)
 Copied from CRM [id number]. Topic: General - Call Back - No Documentation >> Oct 26, 2023  2:56 PM Shereese L wrote: Reason for CRM: patient daughter is requesting a call back from [place], RPH-CPP at [phone]

## 2023-10-26 NOTE — Telephone Encounter (Signed)
 Patient states she did find her adherence box that was delivered 10/11/2023! YAY! She will start today.

## 2023-10-26 NOTE — Telephone Encounter (Signed)
 Patient left message on VM of Clinical Pharmacist Practitioner requesting call back - she states she is still staying at her daughter, Jilll's house. [phone]

## 2023-10-26 NOTE — Telephone Encounter (Signed)
 [facility] with [facility]. Her meds were filled in adherence packages 10/10/2023.  Adherence packages / box was delivered to her daughter's address [address] in [place] - pharmacist provided a description of the courier's picture - post with [id number] on it and a tire close by. Patient's daughter endorsed that this description sounds correct.  I suspect that she have just fnished her box from June and they just need to look for / locate the July box for her to start.  If they are not able to find, her daughter will call me back and I will see if I can get our adherence team to go ahead and fill her prescriptions / do a new box.

## 2023-10-27 ENCOUNTER — Other Ambulatory Visit: Payer: Self-pay

## 2023-10-28 ENCOUNTER — Other Ambulatory Visit: Admitting: Pharmacist

## 2023-10-31 ENCOUNTER — Other Ambulatory Visit (HOSPITAL_COMMUNITY): Payer: Self-pay

## 2023-10-31 ENCOUNTER — Other Ambulatory Visit: Payer: Self-pay | Admitting: Family

## 2023-10-31 MED ORDER — AMOXICILLIN 500 MG PO CAPS
500.0000 mg | ORAL_CAPSULE | Freq: Two times a day (BID) | ORAL | 0 refills | Status: DC
  Filled 2023-10-31 – 2023-11-01 (×2): qty 10, 5d supply, fill #0

## 2023-11-01 ENCOUNTER — Other Ambulatory Visit: Payer: Self-pay

## 2023-11-03 ENCOUNTER — Other Ambulatory Visit: Payer: Self-pay

## 2023-11-03 ENCOUNTER — Other Ambulatory Visit: Payer: Self-pay | Admitting: Family

## 2023-11-03 ENCOUNTER — Other Ambulatory Visit (HOSPITAL_COMMUNITY): Payer: Self-pay

## 2023-11-03 MED ORDER — AMOXICILLIN 500 MG PO CAPS
500.0000 mg | ORAL_CAPSULE | Freq: Two times a day (BID) | ORAL | 0 refills | Status: AC
  Filled 2023-11-03 (×3): qty 10, 5d supply, fill #0

## 2023-11-03 NOTE — Telephone Encounter (Signed)
 Technician for [facility], Zachary, called. Patient recently had ordered amoxicillin  that was delivered to her house, but she never received the medicine. She believes that it may have been stolen. Will need a replacement as soon as possible.

## 2023-11-08 ENCOUNTER — Other Ambulatory Visit: Payer: Self-pay | Admitting: Family

## 2023-11-08 ENCOUNTER — Other Ambulatory Visit (HOSPITAL_COMMUNITY): Payer: Self-pay

## 2023-11-08 ENCOUNTER — Other Ambulatory Visit: Payer: Self-pay

## 2023-11-08 MED ORDER — METOPROLOL SUCCINATE ER 25 MG PO TB24
12.5000 mg | ORAL_TABLET | Freq: Every evening | ORAL | 1 refills | Status: AC
  Filled 2023-11-10: qty 45, 90d supply, fill #0
  Filled 2024-01-31 – 2024-02-02 (×2): qty 15, 30d supply, fill #1

## 2023-11-09 ENCOUNTER — Other Ambulatory Visit: Payer: Self-pay

## 2023-11-09 ENCOUNTER — Other Ambulatory Visit (HOSPITAL_COMMUNITY): Payer: Self-pay

## 2023-11-09 ENCOUNTER — Other Ambulatory Visit: Payer: Self-pay | Admitting: Family

## 2023-11-09 MED ORDER — DIAZEPAM 10 MG PO TABS
10.0000 mg | ORAL_TABLET | Freq: Two times a day (BID) | ORAL | 0 refills | Status: DC | PRN
  Filled 2023-11-09: qty 60, 30d supply, fill #0

## 2023-11-10 ENCOUNTER — Other Ambulatory Visit: Payer: Self-pay

## 2023-11-10 ENCOUNTER — Other Ambulatory Visit (HOSPITAL_COMMUNITY): Payer: Self-pay

## 2023-11-10 ENCOUNTER — Other Ambulatory Visit: Payer: Self-pay | Admitting: Pharmacist

## 2023-11-10 NOTE — Progress Notes (Signed)
 05/18/2023 Name: Veronica Wolf MRN: [id number] DOB: 04-28-1940 Chief Complaint  Patient presents with   Medication Management   Medication Adherence    Veronica Wolf is a 83 y.o. year old female who was referred for medication management by their primary care provider, Daryl Setter, NP.       Subjective: Spoke to patient's daughter today. Veronica Wolf will be staying with her daughter for about 1 or 2 more months while Veronica Wolf' home is being worked on  Medication Access/Adherence  Current Pharmacy:  DARRYLE LAW - [facility] [address] [place] [address] Phone: [phone] Fax: [phone]  Patient reports affordability concerns with their medications: No  Patient reports access/transportation concerns to their pharmacy: medications are delivered to her - if needed her daughter can pick up medications Patient reports adherence concerns with their medications:  Yes  - uses adherence packs but she missed a few doses last month when she went out of town and did not take her packets with her. She also has not filled metoprolol  recently. She is to take metoprolol  25mg  0.5 tablet daily and because it is halved it is not eligible to be included in her adherence packages.    Current adherence strategy: uses adherence packaging. Last adherence packaging filled 10/12/2023, patient did not start this most recent packaging until around 10/31/2023.  Packaging should have this schedule:  Morning 8am: allopurinol  100mg   - take 2 tablets, dicyclomine  10mg , famotidine  20mg , levothyroxine  25 mcg, sucralfate  1 gram Lunch- 12pm / noon:  potassium, ezetimibe , lisinopril , escitalopram , sucralfate  1 gram Supper 6pm - atorvastatin  80mg , isosorbide  Er 30mg , sucralfate  1 gram Night / 10pm: famotidine , dicyclomine , sucralfate  1 gram.   She takes the following medications which are not included in her packaged medication:  aspirin  81mg  daily at night, metoprolol  ER  25mg  0.5 tablet at night, diazepam  as needed and valacyclovir  every other night.  Patient keeps these medications by her bed to take because they cannot be included in packaging.  Last refill for metoprolol  was 07/20/2023 for 90 DS. Patient's daughter was not sure if patient is taking metoprolol  correctly but she does know that patient keeps a few medications by her bed for night time dosing.   Objective:  Lab Results  Component Value Date   HGBA1C 6.5 10/20/2023    Lab Results  Component Value Date   CREATININE 1.26 (H) 10/20/2023   BUN 15 10/20/2023   NA 138 10/20/2023   K 4.8 10/20/2023   CL 105 10/20/2023   CO2 25 10/20/2023    Lab Results  Component Value Date   CHOL 242 (H) 04/22/2023   HDL 45.50 04/22/2023   LDLCALC 153 (H) 04/22/2023   TRIG 217.0 (H) 04/22/2023   CHOLHDL 5 04/22/2023    Medications Reviewed Today     Reviewed by Carla Milling, RPH-CPP (Pharmacist) on 11/10/23 at 1029  Med List Status: <None>   Medication Order Taking? Sig Documenting Provider Last Dose Status Informant  albuterol  (PROVENTIL ) (2.5 MG/3ML) 0.083% nebulizer solution [id number]  USE 1 VIAL IN NEBULIZER EVERY 6 HOURS AS NEEDED FOR WHEEZING AND FOR SHORTNESS OF SHERIDA Daryl, Setter, NP  Active Child, Self  albuterol  (VENTOLIN  HFA) 108 (90 Base) MCG/ACT inhaler [id number] Yes Inhale 2 puffs into the lungs every 6 (six) hours as needed for wheezing or shortness of breath. Daryl Setter, NP  Active Child, Self  allopurinol  (ZYLOPRIM ) 100 MG tablet [id number] Yes Take 2 tablets (200 mg total) by mouth daily.  Daryl Setter, NP  Active   aspirin  81 MG EC tablet [id number] Yes Take 1 tablet (81 mg total) by mouth daily. Swallow whole. Tobb, Kardie, DO  Active Child, Self           Med Note JUSTINO, Kharter Sestak B   Wed Oct 07, 2021 11:19 AM)    atorvastatin  (LIPITOR ) 80 MG tablet [id number] Yes Take 1 tablet (80 mg total) by mouth daily at 6 PM Daryl Setter, NP  Active Child, Self   Blood Glucose Monitoring Suppl (ACCU-CHEK GUIDE ME) w/Device KIT [id number]  Use to test blood sugar in the morning, at noon, and at bedtime. Daryl Setter, NP  Active Child, Self  clobetasol  ointment (TEMOVATE ) 0.05 % [id number]  Apply 1 Application topically 2 (two) times daily. For up to 2 weeks. Daryl Setter, NP  Active Child, Self  colchicine  0.6 MG tablet [id number]  Take 0.6 mg by mouth daily as needed (gout).  Patient not taking: Reported on 11/10/2023   [provider]  Active Child, Self  diazepam  (VALIUM ) 10 MG tablet [id number] Yes Take 1 tablet (10 mg total) by mouth every 12 (twelve) hours as needed for anxiety. O'Sullivan, Melissa, NP  Active   dicyclomine  (BENTYL ) 10 MG capsule [id number] Yes Take 1 capsule (10 mg total) by mouth 2 (two) times daily. O'Sullivan, Melissa, NP  Active   escitalopram  (LEXAPRO ) 20 MG tablet [id number] Yes Take 1 tablet (20 mg total) by mouth daily with lunch Daryl Setter, NP  Active Child, Self  ezetimibe  (ZETIA ) 10 MG tablet [id number] Yes Take 1 tablet (10 mg total) by mouth daily with lunch. O'Sullivan, Melissa, NP  Active   famotidine  (PEPCID ) 20 MG tablet [id number] Yes Take 1 tablet (20 mg total) by mouth in the morning and at bedtime. Daryl Setter, NP  Active   furosemide  (LASIX ) 40 MG tablet [id number] Yes Take 0.5 tablets (20 mg total) by mouth daily as needed. Take only for swelling or weight gain (2 lbs in a day or 5 lbs in a week).  Call your PCP for follow up if you are using this frequently. Perri DELENA Meliton Mickey., MD  Active   ibuprofen (ADVIL) 200 MG tablet [id number]  Take 200 mg by mouth every 6 (six) hours as needed for mild pain (pain score 1-3). [provider]  Active Child, Self  isosorbide  mononitrate (IMDUR ) 60 MG 24 hr tablet [id number] Yes Take 1 tablet (60 mg total) by mouth daily at 6 PM. Daryl Setter, NP  Active   levothyroxine  (SYNTHROID ) 25 MCG tablet [id number] Yes Take 1 tablet (25 mcg  total) by mouth before breakfast. Domenica Harlene DELENA, MD  Active Child, Self  lisinopril  (ZESTRIL ) 2.5 MG tablet [id number] Yes Take 1 tablet (2.5 mg total) by mouth daily with lunch. Daryl Setter, NP  Active   meclizine  (ANTIVERT ) 25 MG tablet [id number] Yes Take 1 tablet (25 mg total) by mouth 3 (three) times daily as needed for dizziness. Daryl Setter, NP  Active Child, Self  metoprolol  succinate (TOPROL -XL) 25 MG 24 hr tablet [id number]  Take 0.5 tablets (12.5 mg total) by mouth every evening. Daryl Setter, NP  Active   nitroGLYCERIN  (NITROSTAT ) 0.4 MG SL tablet [id number]  Place 1 tablet (0.4 mg total) under the tongue every 5 (five) minutes as needed for chest pain. O'Sullivan, Melissa, NP  Active   nystatin  (MYCOSTATIN /NYSTOP ) powder [id number]  Apply 1 Application topically to the affected area(s) 2 (two) times daily.  Patient taking differently: Apply 1 Application topically  2 (two) times daily as needed (for dryness).   Daryl Setter, NP  Active Child, Self  polyethylene glycol powder (GLYCOLAX /MIRALAX ) 17 GM/SCOOP powder [id number]  Take 17 g by mouth daily. [provider]  Active   potassium chloride  SA (KLOR-CON  M) 20 MEQ tablet [id number] Yes Take 1 tablet (20 mEq total) by mouth daily at 12 noon. Daryl Setter, NP  Active Child, Self  Probiotic Product (PROBIOTIC DAILY) CAPS [id number]  Take 1 tablet by mouth daily. Almarie Waddell NOVAK, NP  Active Child, Self  Respiratory Therapy Supplies (NEBULIZER/TUBING/MOUTHPIECE) KIT [id number]  Use as directed Daryl Setter, NP  Active Child, Self  senna (SENOKOT) 8.6 MG TABS tablet [id number]  Take 1 tablet (8.6 mg total) by mouth at bedtime.  Patient taking differently: Take 1 tablet by mouth daily as needed for mild constipation.   Daryl Setter, NP  Active Child, Self  sucralfate  (CARAFATE ) 1 g tablet [id number] Yes Take 1 tablet (1 g total) by mouth 4 (four) times daily -  with meals and at bedtime. Take  one hour prior to morning medications. Daryl Setter, NP  Active   valACYclovir  (VALTREX ) 500 MG tablet [id number] Yes Take 1 tablet (500 mg total) by mouth every other day. Daryl Setter, NP  Active Child, Self              Assessment/Plan:   Medication Management: - Reviewed  medications and packaging with patient's daughter.  - Pharmacy will be working on her adherence packaging in the next week. It should be delivered to her daughter's address. - Requested RF for metoprolol  ER 25mg  - take 0.5 tablet daily from [facility].   Follow Up Plan: 2 to 4 weeks.   Madelin Ray, PharmD Clinical Pharmacist [facility]

## 2023-11-14 ENCOUNTER — Other Ambulatory Visit (HOSPITAL_COMMUNITY): Payer: Self-pay

## 2023-11-14 ENCOUNTER — Other Ambulatory Visit: Payer: Self-pay

## 2023-11-22 ENCOUNTER — Telehealth: Payer: Self-pay | Admitting: Family

## 2023-11-22 NOTE — Telephone Encounter (Signed)
 Called patient back at home and cell numbers but no answer.  I do not see any notes about anyone from the office contacting patient.

## 2023-11-22 NOTE — Telephone Encounter (Signed)
 Copied from CRM #[id number]. Topic: General - Call Back - No Documentation >> Nov 22, 2023 12:57 PM Shereese L wrote: Reason for CRM: patient returning office call and was adv nurse is with a patient and was adv they will call her back

## 2023-11-23 ENCOUNTER — Other Ambulatory Visit (HOSPITAL_COMMUNITY): Payer: Self-pay

## 2023-11-23 ENCOUNTER — Other Ambulatory Visit: Payer: Self-pay

## 2023-11-24 ENCOUNTER — Other Ambulatory Visit: Payer: Self-pay

## 2023-11-25 ENCOUNTER — Ambulatory Visit: Payer: Self-pay

## 2023-11-25 NOTE — Telephone Encounter (Signed)
 FYI Only or Action Required?: FYI only for provider.  Patient was last seen in primary care on 10/20/2023 by Jason Leita Repine, FNP.  Called Nurse Triage reporting No chief complaint on file..  Symptoms began today.  Interventions attempted: Rest, hydration, or home remedies.  Symptoms are: gradually worsening.  Triage Disposition: Call EMS 911 Now  Patient/caregiver understands and will follow disposition?: Yes  Reason for Disposition  [1] Chest pain lasts > 5 minutes AND [2] age > 63  [1] Chest pain lasts > 5 minutes AND [2] history of heart disease (i.e., angina, heart attack, heart failure, bypass surgery, takes nitroglycerin )  [1] Chest pain lasts > 5 minutes AND [2] described as crushing, pressure-like, or heavy  Sounds like a life-threatening emergency to the triager  Answer Assessment - Initial Assessment Questions 83 yr old female was transferred to this RN for telephone triage. She reports that she is experiencing chest pressure than began early this morning. Denies cardiac hx, but has rx for nitroglycerine (she reports it is lost) and has NSTEMI documented in her chart. Patient initially refused 911 to ED recommendation. This RN provided rationale and patient agreed to have 911 called on her behalf for an evaluation, this RN advised that they will likely also recommend the Emergency Department.  This RN contacted 911 and EMS was dispatched to her home address. Towards the end of the call, audible wheezing was heard. Patient continued to be able to speak in clear and full sentences and stated the wheezing was from her getting dressed. This RN remained on the telephone with patient until their arrival.    1. LOCATION: Where does it hurt?       Chest  2. RADIATION: Does the pain go anywhere else? (e.g., into neck, jaw, arms, back)     Denies  3. ONSET: When did the chest pain begin? (Minutes, hours or days)      States early this morning 11/25/23  4. PATTERN:  Does the pain come and go, or has it been constant since it started?  Does it get worse with exertion?      Constant  5. DURATION: How long does it last (e.g., seconds, minutes, hours)     Constant  6. SEVERITY: How bad is the pain?  (e.g., Scale 1-10; mild, moderate, or severe)     Patient unable to specify severity  7. CARDIAC RISK FACTORS: Do you have any history of heart problems or risk factors for heart disease? (e.g., angina, prior heart attack; diabetes, high blood pressure, high cholesterol, smoker, or strong family history of heart disease)     NSTEMI, CAD, HTN  Protocols used: Chest Pain-A-AH Copied from CRM #[id number]. Topic: Clinical - Red Word Triage >> Nov 25, 2023  3:57 PM Harlene ORN wrote: Red Word that prompted transfer to Nurse Triage: having bad chest pain and lost her pain medication

## 2023-11-29 ENCOUNTER — Other Ambulatory Visit (HOSPITAL_COMMUNITY): Payer: Self-pay

## 2023-11-29 ENCOUNTER — Other Ambulatory Visit: Payer: Self-pay | Admitting: Family

## 2023-11-29 MED ORDER — FUROSEMIDE 40 MG PO TABS
ORAL_TABLET | ORAL | 0 refills | Status: DC
  Filled 2023-12-07 (×2): qty 30, 60d supply, fill #0

## 2023-11-30 ENCOUNTER — Ambulatory Visit: Payer: Self-pay

## 2023-11-30 NOTE — Telephone Encounter (Signed)
 FYI Only or Action Required?: Action required by provider: Patient's daughter requesting an appointment next week.  Patient was last seen in primary care on 10/20/2023 by Jason Leita Repine, FNP.  Called Nurse Triage reporting Abdominal Pain.  Symptoms began a week ago.  Interventions attempted: OTC medications: Pepto bismal and Prescription medications: unsure of name.  Symptoms are: gradually worsening.  Triage Disposition: See Physician Within 24 Hours  Patient/caregiver understands and will follow disposition?: No, refuses disposition    Copied from CRM #[id number]. Topic: Clinical - Red Word Triage >> Nov 30, 2023  3:18 PM Frederich PARAS wrote: [facility] that prompted transfer to Nurse Triage: pain memory loss   pt having memory issues, stomach bothering her, little bit of pain. Pt daughter Kate is on the line. Reason for Disposition  [1] MODERATE pain (e.g., interferes with normal activities) AND [2] pain comes and goes (cramps) AND [3] present > 24 hours  (Exception: Pain with Vomiting or Diarrhea - see that Guideline.)  Answer Assessment - Initial Assessment Questions Spoke with patient's daughter concerning patient's symptoms. Pepto bismal and other prescribed medication used for symptoms. Pt states she feels like she wants to vomit in the morning when getting up. Patient's daughter also concerned about her memory issues. Patient is having issues remembering who her daughter is. Symptoms are starting to worsen in the last few days. States she has had trouble with memory on and off for awhile. This RN suggested that the patient come into the office tomorrow to be seen for symptoms, patient's daughter declined appointments. She states the patient is going out of town and is requesting an appointment for next week. Patient's daughter requesting a call back. Name and number verified Kate [phone]. Will route to office for follow up.    1. LOCATION: Where does it hurt?      Upper abd  pain  2. RADIATION: Does the pain shoot anywhere else? (e.g., chest, back)     Denies  3. ONSET: When did the pain begin? (e.g., minutes, hours or days ago)      Last week during the weekend  4. SUDDEN: Gradual or sudden onset?     Unsure  5. PATTERN Does the pain come and go, or is it constant?     Symptoms come and go  6. SEVERITY: How bad is the pain?  (e.g., Scale 1-10; mild, moderate, or severe)     8/10 7. RECURRENT SYMPTOM: Have you ever had this type of stomach pain before? If Yes, ask: When was the last time? and What happened that time?      Yes, she has had pain before. Daughter unsure of what they said regarding pain.  8. CAUSE: What do you think is causing the stomach pain? (e.g., gallstones, recent abdominal surgery)     Unsure  9. RELIEVING/AGGRAVATING FACTORS: What makes it better or worse? (e.g., antacids, bending or twisting motion, bowel movement)     Taking peto bismal helps symptoms. Moving around causes the pain  10. OTHER SYMPTOMS: Do you have any other symptoms? (e.g., back pain, diarrhea, fever, urination pain, vomiting)       Some time she has diarrhea and at times she has headaches.  Protocols used: Abdominal Pain - Female-A-AH

## 2023-11-30 NOTE — Telephone Encounter (Signed)
 Patient was scheduled to see Veronica Wolf tomorrow at 8:40

## 2023-12-01 ENCOUNTER — Ambulatory Visit: Admitting: Physician Assistant

## 2023-12-02 ENCOUNTER — Other Ambulatory Visit: Payer: Self-pay

## 2023-12-03 ENCOUNTER — Other Ambulatory Visit: Payer: Self-pay | Admitting: Family

## 2023-12-05 ENCOUNTER — Other Ambulatory Visit: Payer: Self-pay

## 2023-12-06 ENCOUNTER — Other Ambulatory Visit: Payer: Self-pay | Admitting: Family

## 2023-12-06 ENCOUNTER — Ambulatory Visit: Payer: Self-pay

## 2023-12-06 ENCOUNTER — Other Ambulatory Visit: Payer: Self-pay | Admitting: Family Medicine

## 2023-12-06 ENCOUNTER — Other Ambulatory Visit: Payer: Self-pay | Admitting: Pharmacist

## 2023-12-06 ENCOUNTER — Other Ambulatory Visit (HOSPITAL_COMMUNITY): Payer: Self-pay

## 2023-12-06 ENCOUNTER — Other Ambulatory Visit: Payer: Self-pay

## 2023-12-06 ENCOUNTER — Telehealth: Payer: Self-pay

## 2023-12-06 DIAGNOSIS — R1013 Epigastric pain: Secondary | ICD-10-CM

## 2023-12-06 DIAGNOSIS — F419 Anxiety disorder, unspecified: Secondary | ICD-10-CM

## 2023-12-06 DIAGNOSIS — E782 Mixed hyperlipidemia: Secondary | ICD-10-CM

## 2023-12-06 DIAGNOSIS — M109 Gout, unspecified: Secondary | ICD-10-CM

## 2023-12-06 DIAGNOSIS — E876 Hypokalemia: Secondary | ICD-10-CM

## 2023-12-06 DIAGNOSIS — I2510 Atherosclerotic heart disease of native coronary artery without angina pectoris: Secondary | ICD-10-CM

## 2023-12-06 MED ORDER — ALLOPURINOL 100 MG PO TABS
200.0000 mg | ORAL_TABLET | Freq: Every day | ORAL | 0 refills | Status: DC
  Filled 2023-12-06: qty 180, 90d supply, fill #0
  Filled 2023-12-07 – 2023-12-12 (×2): qty 60, 30d supply, fill #0
  Filled 2024-01-04: qty 60, 30d supply, fill #1
  Filled 2024-01-31: qty 60, 30d supply, fill #2

## 2023-12-06 MED ORDER — ISOSORBIDE MONONITRATE ER 60 MG PO TB24
60.0000 mg | ORAL_TABLET | Freq: Every day | ORAL | 0 refills | Status: DC
  Filled 2023-12-06: qty 90, 90d supply, fill #0
  Filled 2023-12-07 – 2023-12-12 (×2): qty 30, 30d supply, fill #0
  Filled 2024-01-04: qty 30, 30d supply, fill #1
  Filled 2024-01-31: qty 30, 30d supply, fill #2

## 2023-12-06 MED ORDER — DIAZEPAM 10 MG PO TABS
10.0000 mg | ORAL_TABLET | Freq: Two times a day (BID) | ORAL | 0 refills | Status: DC | PRN
  Filled 2023-12-06 – 2023-12-07 (×2): qty 60, 30d supply, fill #0

## 2023-12-06 MED ORDER — EZETIMIBE 10 MG PO TABS
10.0000 mg | ORAL_TABLET | Freq: Every day | ORAL | 0 refills | Status: DC
  Filled 2023-12-06: qty 90, 90d supply, fill #0
  Filled 2023-12-07 – 2023-12-12 (×2): qty 30, 30d supply, fill #0
  Filled 2024-01-04: qty 30, 30d supply, fill #1
  Filled 2024-01-31: qty 30, 30d supply, fill #2

## 2023-12-06 MED ORDER — SUCRALFATE 1 G PO TABS
1.0000 g | ORAL_TABLET | Freq: Three times a day (TID) | ORAL | 0 refills | Status: DC
  Filled 2023-12-06 – 2023-12-12 (×3): qty 120, 30d supply, fill #0

## 2023-12-06 MED ORDER — LEVOTHYROXINE SODIUM 25 MCG PO TABS
25.0000 ug | ORAL_TABLET | Freq: Every morning | ORAL | 1 refills | Status: AC
  Filled 2023-12-06 – 2023-12-07 (×2): qty 90, 90d supply, fill #0
  Filled 2023-12-07 – 2023-12-12 (×2): qty 30, 30d supply, fill #0
  Filled 2024-01-04: qty 30, 30d supply, fill #1
  Filled 2024-01-31: qty 30, 30d supply, fill #2
  Filled 2024-02-24 – 2024-02-28 (×2): qty 30, 30d supply, fill #3
  Filled 2024-04-11: qty 30, 30d supply, fill #4
  Filled 2024-05-11: qty 30, 30d supply, fill #5

## 2023-12-06 MED ORDER — LISINOPRIL 2.5 MG PO TABS
2.5000 mg | ORAL_TABLET | Freq: Every day | ORAL | 0 refills | Status: DC
  Filled 2023-12-06: qty 90, 90d supply, fill #0
  Filled 2023-12-07 – 2023-12-12 (×2): qty 30, 30d supply, fill #0
  Filled 2024-01-04: qty 30, 30d supply, fill #1
  Filled 2024-01-31: qty 30, 30d supply, fill #2

## 2023-12-06 MED ORDER — VALACYCLOVIR HCL 500 MG PO TABS
ORAL_TABLET | ORAL | 1 refills | Status: AC
  Filled 2023-12-06 – 2023-12-07 (×2): qty 45, 90d supply, fill #0
  Filled 2023-12-07: qty 15, 30d supply, fill #0
  Filled 2024-01-04: qty 15, 30d supply, fill #1
  Filled 2024-01-19: qty 15, 30d supply, fill #2
  Filled 2024-01-31 – 2024-02-22 (×2): qty 15, 30d supply, fill #3

## 2023-12-06 MED ORDER — ATORVASTATIN CALCIUM 80 MG PO TABS
80.0000 mg | ORAL_TABLET | Freq: Every day | ORAL | 0 refills | Status: DC
  Filled 2023-12-06: qty 90, 90d supply, fill #0
  Filled 2023-12-07 – 2023-12-12 (×2): qty 30, 30d supply, fill #0
  Filled 2024-01-04: qty 30, 30d supply, fill #1
  Filled 2024-01-31: qty 30, 30d supply, fill #2

## 2023-12-06 MED ORDER — POTASSIUM CHLORIDE CRYS ER 20 MEQ PO TBCR
20.0000 meq | EXTENDED_RELEASE_TABLET | Freq: Every day | ORAL | 0 refills | Status: DC
  Filled 2023-12-06 – 2023-12-07 (×2): qty 90, 90d supply, fill #0
  Filled 2023-12-07 – 2023-12-12 (×2): qty 30, 30d supply, fill #0
  Filled 2024-01-04: qty 30, 30d supply, fill #1
  Filled 2024-01-31: qty 30, 30d supply, fill #2

## 2023-12-06 MED ORDER — ESCITALOPRAM OXALATE 20 MG PO TABS
20.0000 mg | ORAL_TABLET | Freq: Every day | ORAL | 0 refills | Status: DC
Start: 2023-12-06 — End: 2024-02-22
  Filled 2023-12-06: qty 90, 90d supply, fill #0
  Filled 2023-12-07 – 2023-12-12 (×2): qty 30, 30d supply, fill #0
  Filled 2024-01-02 – 2024-01-04 (×2): qty 30, 30d supply, fill #1
  Filled 2024-01-31: qty 30, 30d supply, fill #2
  Filled ????-??-??: fill #1

## 2023-12-06 NOTE — Telephone Encounter (Signed)
 FYI Only or Action Required?: Action required by provider: clinical question for provider.  Patient was last seen in primary care on 10/20/2023 by Jason Leita Repine, FNP.  Called Nurse Triage reporting Chest Pain.  Symptoms began today.  Interventions attempted: OTC medications: Pepto.  Symptoms are: gradually improving. States started having chest pain today. Daughter gave her Pepto and is better now. No radiation of pain. No SOB, nausea or dizziness. Declines OV. Will go to ED if pain returns.  Triage Disposition: See Physician Within 24 Hours  Patient/caregiver understands and will follow disposition?: No, refuses disposition   Copied from CRM #[id number]. Topic: Clinical - Red Word Triage >> Dec 06, 2023  3:18 PM Frederich PARAS wrote: [facility] that prompted transfer to Nurse Triage: chest pain   pt have pain in chest, unable to get prescription  until then . Pt wants to know what to take to ease pain Reason for Disposition  [1] Chest pain lasts > 5 minutes AND [2] occurred > 3 days ago (72 hours) AND [3] NO chest pain or cardiac symptoms now  Answer Assessment - Initial Assessment Questions 1. LOCATION: Where does it hurt?       middle 2. RADIATION: Does the pain go anywhere else? (e.g., into neck, jaw, arms, back)     no 3. ONSET: When did the chest pain begin? (Minutes, hours or days)      1 hour ago 4. PATTERN: Does the pain come and go, or has it been constant since it started?  Does it get worse with exertion?      Comes and goes 5. DURATION: How long does it last (e.g., seconds, minutes, hours)     hours 6. SEVERITY: How bad is the pain?  (e.g., Scale 1-10; mild, moderate, or severe)     6-7 7. CARDIAC RISK FACTORS: Do you have any history of heart problems or risk factors for heart disease? (e.g., angina, prior heart attack; diabetes, high blood pressure, high cholesterol, smoker, or strong family history of heart disease)     no 8. PULMONARY RISK FACTORS:  Do you have any history of lung disease?  (e.g., blood clots in lung, asthma, emphysema, birth control pills)     no 9. CAUSE: What do you think is causing the chest pain?     heartburn 10. OTHER SYMPTOMS: Do you have any other symptoms? (e.g., dizziness, nausea, vomiting, sweating, fever, difficulty breathing, cough)       no 11. PREGNANCY: Is there any chance you are pregnant? When was your last menstrual period?       no  Protocols used: Chest Pain-A-AH

## 2023-12-06 NOTE — Telephone Encounter (Signed)
 Copied from CRM [id number]. Topic: General - Other >> Dec 06, 2023 11:33 AM Carlyon D wrote: Reason for CRM: pt is calling to speak to tammy in regards to medicatin. Please reach out to pt. Pt is also stating she wants all medication mailed to  [address], [place]  [place] [address]

## 2023-12-06 NOTE — Telephone Encounter (Signed)
Please call patient to schedule a follow up visit with me

## 2023-12-06 NOTE — Telephone Encounter (Signed)
 Noted

## 2023-12-06 NOTE — Telephone Encounter (Signed)
 Sent message to adherence packaging team this morning. Patient's temporary address is still her daughter's address - [address] so everything should still be mailed there. I tried to call patient to discuss but no answer. LM on VM

## 2023-12-06 NOTE — Telephone Encounter (Signed)
 Message forwarded to Tammy E. To return phone call

## 2023-12-06 NOTE — Progress Notes (Signed)
   05/18/2023 Name: Veronica Wolf MRN: [id number] DOB: September 26, 1940 Chief Complaint  Patient presents with   Medication Management    Veronica Wolf is a 83 y.o. year old female who was referred for medication management by their primary care provider, Veronica Setter, NP.       Subjective: Spoke to patient's daughter today. Veronica Wolf will be staying with her daughter for about 1 or 2 more months while Veronica Wolf' home is being worked on  Medication Access/Adherence  Current Pharmacy:  DARRYLE LAW - [facility] [address] [place] [address] Phone: [phone] Fax: [phone]  Patient reports affordability concerns with their medications: No  Patient reports access/transportation concerns to their pharmacy: medications are delivered to her - if needed her daughter can pick up medications Patient reports adherence concerns with their medications:  Yes  - uses adherence packs but it does not look like her packs were filled in August. Veronica Wolf reports today that she has been out of her packaged medications for the last few days. She has not called the pharmacy to request.   Current adherence strategy: uses adherence packaging. Last adherence packaging filled 10/12/2023, patient did not start this most recent packaging until around 10/31/2023 and should have lasted to around 11/30/2023.   Packaging should have this schedule:  Morning 8am: allopurinol  100mg   - take 2 tablets, dicyclomine  10mg , famotidine  20mg , levothyroxine  25 mcg, sucralfate  1 gram Lunch- 12pm / noon:  potassium, ezetimibe , lisinopril , escitalopram , sucralfate  1 gram Supper 6pm - atorvastatin  80mg , isosorbide  Er 30mg , sucralfate  1 gram Night / 10pm: famotidine , dicyclomine , sucralfate  1 gram.   She takes the following medications which are not included in her packaged medication:  aspirin  81mg  daily at night, metoprolol  ER 25mg  0.5 tablet at night, diazepam  as needed and valacyclovir   every other night.  Patient keeps these medications by her bed to take because they cannot be included in packaging.    Objective:  Lab Results  Component Value Date   HGBA1C 6.5 10/20/2023    Lab Results  Component Value Date   CREATININE 1.26 (H) 10/20/2023   BUN 15 10/20/2023   NA 138 10/20/2023   K 4.8 10/20/2023   CL 105 10/20/2023   CO2 25 10/20/2023    Lab Results  Component Value Date   CHOL 242 (H) 04/22/2023   HDL 45.50 04/22/2023   LDLCALC 153 (H) 04/22/2023   TRIG 217.0 (H) 04/22/2023   CHOLHDL 5 04/22/2023    Medications Reviewed Today   Medications were not reviewed in this encounter       Assessment/Plan:   Medication Management: - Reviewed  medications and packaging with patient's daughter.  - Will ask pharmacy to start work on her adherence packs. She also is requesting refills for diazepam  and valacyclovir  to be delivered later this week / early next week.  It should be delivered to her daughter's address. - Restart metoprolol  ER 25mg  - take 0.5 tablet daily at bedtime  Follow Up Plan: 2 to 4 weeks.   Veronica Wolf, PharmD Clinical Pharmacist [facility]

## 2023-12-06 NOTE — Telephone Encounter (Signed)
 Requesting: diazepam  10mg   Contract: 07/26/22 UDS: 07/26/22 Last Visit: 10/20/23 Next Visit: None Last Refill: 11/09/23 #60 and 0RF   Please Advise

## 2023-12-07 ENCOUNTER — Telehealth: Payer: Self-pay

## 2023-12-07 ENCOUNTER — Other Ambulatory Visit: Payer: Self-pay

## 2023-12-07 ENCOUNTER — Other Ambulatory Visit (HOSPITAL_COMMUNITY): Payer: Self-pay

## 2023-12-07 NOTE — Telephone Encounter (Signed)
 Patient is calling multiple times and there are multiple messages open. Please see phone message from 12/06/2023.  E2C2 can message me on teams and if I am available they can transfer patient.

## 2023-12-07 NOTE — Telephone Encounter (Signed)
 Copied from CRM #[id number]. Topic: General - Call Back - No Documentation >> Dec 07, 2023  2:07 PM Lauren C wrote: Reason for CRM: Daughter Rozena, on [place]) returning a missed call to [employer] regarding having a medication sent to her. Hipolito unable to forward message directly to Tammy, please forward this message to [place] in pharmacy.

## 2023-12-07 NOTE — Telephone Encounter (Signed)
 Copied from CRM #[id number]. Topic: Clinical - Medication Question >> Dec 07, 2023 11:18 AM Frederich PARAS wrote: Reason for CRM: pt calling in she wants to speak to [place], [place], or can tammy reach out to the pt ASAP. Pt says she is expecting a package with he medication but she has not received pt really needs the medication . Pt wants to know was the package mailed out or not callback# is [phone]

## 2023-12-07 NOTE — Telephone Encounter (Signed)
 Spoke with patient today. She is asking about her packaged medications. Explained that Dylan with our adherence packaging is working on her packaging and filling diazepam  and valacyclovir .  Provided patient with number to [facility] [phone] to call to check on her expected delivery date.

## 2023-12-07 NOTE — Telephone Encounter (Signed)
Called pt - unable to leave voicemail as mailbox is full.

## 2023-12-08 ENCOUNTER — Other Ambulatory Visit (HOSPITAL_BASED_OUTPATIENT_CLINIC_OR_DEPARTMENT_OTHER): Payer: Self-pay

## 2023-12-08 ENCOUNTER — Telehealth: Payer: Self-pay | Admitting: *Deleted

## 2023-12-08 ENCOUNTER — Other Ambulatory Visit: Payer: Self-pay

## 2023-12-08 ENCOUNTER — Other Ambulatory Visit (HOSPITAL_COMMUNITY): Payer: Self-pay

## 2023-12-08 ENCOUNTER — Ambulatory Visit

## 2023-12-08 NOTE — Telephone Encounter (Signed)
 FYI:  Called pt for AWV today and she reports that she is on her way to the hospital with chest pains. Please have pt reschedule her AWV after hospital follow up.

## 2023-12-09 ENCOUNTER — Other Ambulatory Visit: Payer: Self-pay

## 2023-12-09 ENCOUNTER — Telehealth: Payer: Self-pay | Admitting: Pharmacist

## 2023-12-09 ENCOUNTER — Other Ambulatory Visit (HOSPITAL_COMMUNITY): Payer: Self-pay

## 2023-12-09 NOTE — Telephone Encounter (Signed)
 Patient left 3 messages today on VM of Clinical Pharmacist Practitioner but did not leave any details about what she needed. I called back but had to leave a message. I suspect she might be asking about her medication delivery. Per central fill pharmacy / adherence packaging they are filling her medications and they will be delivered next week. Per central fill patient's last adherence packs were filled 8/6 and delivered to her 8/7; Diazepam  was filled 8/7 and delivery 8/8. Patient should have enough to last until through the weekend. See delivery confirmations below.

## 2023-12-10 ENCOUNTER — Other Ambulatory Visit (HOSPITAL_COMMUNITY): Payer: Self-pay

## 2023-12-11 ENCOUNTER — Other Ambulatory Visit (HOSPITAL_COMMUNITY): Payer: Self-pay

## 2023-12-12 ENCOUNTER — Other Ambulatory Visit (HOSPITAL_BASED_OUTPATIENT_CLINIC_OR_DEPARTMENT_OTHER): Payer: Self-pay

## 2023-12-12 ENCOUNTER — Other Ambulatory Visit: Payer: Self-pay

## 2023-12-12 ENCOUNTER — Other Ambulatory Visit (HOSPITAL_COMMUNITY): Payer: Self-pay

## 2023-12-13 ENCOUNTER — Other Ambulatory Visit: Payer: Self-pay

## 2023-12-13 ENCOUNTER — Other Ambulatory Visit: Payer: Self-pay | Admitting: Family

## 2023-12-13 ENCOUNTER — Other Ambulatory Visit (HOSPITAL_COMMUNITY): Payer: Self-pay

## 2023-12-14 ENCOUNTER — Other Ambulatory Visit

## 2023-12-14 ENCOUNTER — Other Ambulatory Visit: Payer: Self-pay

## 2023-12-14 ENCOUNTER — Telehealth: Payer: Self-pay | Admitting: Pharmacist

## 2023-12-14 ENCOUNTER — Other Ambulatory Visit (HOSPITAL_BASED_OUTPATIENT_CLINIC_OR_DEPARTMENT_OTHER): Payer: Self-pay

## 2023-12-14 ENCOUNTER — Other Ambulatory Visit: Payer: Self-pay | Admitting: Family

## 2023-12-14 ENCOUNTER — Telehealth: Payer: Self-pay | Admitting: Cardiology

## 2023-12-14 ENCOUNTER — Other Ambulatory Visit (HOSPITAL_COMMUNITY): Payer: Self-pay

## 2023-12-14 MED ORDER — NITROGLYCERIN 0.4 MG SL SUBL
0.4000 mg | SUBLINGUAL_TABLET | SUBLINGUAL | 3 refills | Status: DC | PRN
  Filled 2023-12-14: qty 25, 1d supply, fill #0
  Filled 2023-12-14: qty 25, 8d supply, fill #0
  Filled 2023-12-14: qty 25, 30d supply, fill #0
  Filled 2023-12-16 (×2): qty 25, 1d supply, fill #1
  Filled 2023-12-26: qty 25, 1d supply, fill #2

## 2023-12-14 NOTE — Telephone Encounter (Signed)
 Spoke with pt and explained that the pt's PCP Eleanor Ponto, NP had sent in a rx of nitroglycerin  this morning to the [name] community pharmacy. Gave pt the address and phone number of the [facility] location. Advised pt that if her chest pain is ongoing and doesn't get better/ gets worse, she will need to go to the ED to be evaluated. Pt stated she was going to try and find transportation to the pharmacy today and would try to get the medication today. Advised that if pt is not able to obtain medication today, she will need to be evaluated at the ED. Pt agreed to plan. Attempted to call pt's daughter per DPR on file to go over this information as well since pt seemed to be confused of some of the information given, but unable to reach and unable to leave VM.

## 2023-12-14 NOTE — Telephone Encounter (Signed)
 Pt c/o medication issue:  1. Name of Medication:   nitroGLYCERIN  (NITROSTAT ) 0.4 MG SL tablet   2. How are you currently taking this medication (dosage and times per day)?   3. Are you having a reaction (difficulty breathing--STAT)?   4. What is your medication issue?   Caller Jolynn) stated patient requested Nitroglycerin  refill 6 days ago and call again today and requested more Nitroglycerin  medication.  Caller is concerned patient did not want to call 911 and will be going to the [facility] to collect this medication and wants a call back to patient directly.

## 2023-12-14 NOTE — Telephone Encounter (Signed)
 Attempt was made to contact patient by phone today for follow up by Clinical Pharmacist regarding medication management / adherence.   Unable to reach patient. LM on VM with my contact number [phone].

## 2023-12-15 ENCOUNTER — Encounter (HOSPITAL_BASED_OUTPATIENT_CLINIC_OR_DEPARTMENT_OTHER): Payer: Self-pay | Admitting: Emergency Medicine

## 2023-12-15 ENCOUNTER — Other Ambulatory Visit (HOSPITAL_BASED_OUTPATIENT_CLINIC_OR_DEPARTMENT_OTHER): Payer: Self-pay

## 2023-12-15 ENCOUNTER — Emergency Department (HOSPITAL_BASED_OUTPATIENT_CLINIC_OR_DEPARTMENT_OTHER)

## 2023-12-15 ENCOUNTER — Ambulatory Visit (INDEPENDENT_AMBULATORY_CARE_PROVIDER_SITE_OTHER): Admitting: Family

## 2023-12-15 ENCOUNTER — Other Ambulatory Visit: Payer: Self-pay

## 2023-12-15 ENCOUNTER — Emergency Department (HOSPITAL_BASED_OUTPATIENT_CLINIC_OR_DEPARTMENT_OTHER): Admission: EM | Admit: 2023-12-15 | Discharge: 2023-12-15 | Disposition: A | Discharge status: Home / Self Care

## 2023-12-15 VITALS — BP 118/78 | HR 72 | Temp 97.9°F | Resp 20

## 2023-12-15 DIAGNOSIS — Z7982 Long term (current) use of aspirin: Secondary | ICD-10-CM | POA: Insufficient documentation

## 2023-12-15 DIAGNOSIS — R110 Nausea: Secondary | ICD-10-CM

## 2023-12-15 DIAGNOSIS — R519 Headache, unspecified: Secondary | ICD-10-CM | POA: Diagnosis not present

## 2023-12-15 DIAGNOSIS — R079 Chest pain, unspecified: Secondary | ICD-10-CM

## 2023-12-15 DIAGNOSIS — N390 Urinary tract infection, site not specified: Secondary | ICD-10-CM | POA: Diagnosis not present

## 2023-12-15 DIAGNOSIS — R059 Cough, unspecified: Secondary | ICD-10-CM | POA: Diagnosis not present

## 2023-12-15 DIAGNOSIS — R0602 Shortness of breath: Secondary | ICD-10-CM | POA: Insufficient documentation

## 2023-12-15 DIAGNOSIS — R413 Other amnesia: Secondary | ICD-10-CM

## 2023-12-15 DIAGNOSIS — R0789 Other chest pain: Secondary | ICD-10-CM | POA: Insufficient documentation

## 2023-12-15 DIAGNOSIS — K5730 Diverticulosis of large intestine without perforation or abscess without bleeding: Secondary | ICD-10-CM | POA: Diagnosis not present

## 2023-12-15 DIAGNOSIS — R112 Nausea with vomiting, unspecified: Secondary | ICD-10-CM | POA: Diagnosis not present

## 2023-12-15 DIAGNOSIS — R1010 Upper abdominal pain, unspecified: Secondary | ICD-10-CM | POA: Diagnosis not present

## 2023-12-15 LAB — URINALYSIS, W/ REFLEX TO CULTURE (INFECTION SUSPECTED)
Bilirubin Urine: NEGATIVE
Glucose, UA: NEGATIVE mg/dL
Ketones, ur: NEGATIVE mg/dL
Nitrite: POSITIVE — AB
Protein, ur: NEGATIVE mg/dL
Specific Gravity, Urine: <=1.005 (ref 1.005–1.030)
pH: 5.5 (ref 5.0–8.0)

## 2023-12-15 LAB — HEPATIC FUNCTION PANEL
ALT: 6 U/L (ref 0–44)
AST: 20 U/L (ref 15–41)
Albumin: 4.1 g/dL (ref 3.5–5.0)
Alkaline Phosphatase: 65 U/L (ref 38–126)
Bilirubin, Direct: <0.1 mg/dL (ref 0.0–0.2)
Total Bilirubin: 0.2 mg/dL (ref 0.0–1.2)
Total Protein: 7.4 g/dL (ref 6.5–8.1)

## 2023-12-15 LAB — BASIC METABOLIC PANEL WITH GFR
Anion gap: 13 (ref 5–15)
BUN: 12 mg/dL (ref 8–23)
CO2: 21 mmol/L — ABNORMAL LOW (ref 22–32)
Calcium: 8.7 mg/dL — ABNORMAL LOW (ref 8.9–10.3)
Chloride: 105 mmol/L (ref 98–111)
Creatinine, Ser: 1.21 mg/dL — ABNORMAL HIGH (ref 0.44–1.00)
GFR, Estimated: 44 mL/min — ABNORMAL LOW (ref >60)
Glucose, Bld: 131 mg/dL — ABNORMAL HIGH (ref 70–99)
Potassium: 3.6 mmol/L (ref 3.5–5.1)
Sodium: 139 mmol/L (ref 135–145)

## 2023-12-15 LAB — TROPONIN T, HIGH SENSITIVITY
Troponin T High Sensitivity: <15 ng/L (ref 0–19)
Troponin T High Sensitivity: <15 ng/L (ref 0–19)

## 2023-12-15 LAB — LIPASE, BLOOD: Lipase: 32 U/L (ref 11–51)

## 2023-12-15 LAB — CBC
HCT: 40.1 % (ref 36.0–46.0)
Hemoglobin: 12.5 g/dL (ref 12.0–15.0)
MCH: 28.1 pg (ref 26.0–34.0)
MCHC: 31.2 g/dL (ref 30.0–36.0)
MCV: 90.1 fL (ref 80.0–100.0)
Platelets: 212 K/uL (ref 150–400)
RBC: 4.45 MIL/uL (ref 3.87–5.11)
RDW: 16.1 % — ABNORMAL HIGH (ref 11.5–15.5)
WBC: 11.6 K/uL — ABNORMAL HIGH (ref 4.0–10.5)
nRBC: 0 % (ref 0.0–0.2)

## 2023-12-15 LAB — RESP PANEL BY RT-PCR (RSV, FLU A&B, COVID)  RVPGX2
Influenza A by PCR: NEGATIVE
Influenza B by PCR: NEGATIVE
Resp Syncytial Virus by PCR: NEGATIVE
SARS Coronavirus 2 by RT PCR: NEGATIVE

## 2023-12-15 LAB — LACTIC ACID, PLASMA: Lactic Acid, Venous: 1.4 mmol/L (ref 0.5–1.9)

## 2023-12-15 MED ORDER — CEPHALEXIN 500 MG PO CAPS
500.0000 mg | ORAL_CAPSULE | Freq: Four times a day (QID) | ORAL | 0 refills | Status: AC
Start: 2023-12-15 — End: 2023-12-23
  Filled 2023-12-15 – 2023-12-16 (×2): qty 28, 7d supply, fill #0

## 2023-12-15 MED ORDER — IOHEXOL 300 MG/ML  SOLN
85.0000 mL | Freq: Once | INTRAMUSCULAR | Status: AC | PRN
  Administered 2023-12-15: 85 mL via INTRAVENOUS

## 2023-12-15 MED ORDER — ONDANSETRON HCL 4 MG/2ML IJ SOLN
4.0000 mg | Freq: Once | INTRAMUSCULAR | Status: AC
  Administered 2023-12-15: 4 mg via INTRAMUSCULAR

## 2023-12-15 MED ORDER — ONDANSETRON HCL 4 MG/2ML IJ SOLN
4.0000 mg | Freq: Once | INTRAMUSCULAR | Status: AC
  Administered 2023-12-15: 4 mg via INTRAVENOUS
  Filled 2023-12-15: qty 2

## 2023-12-15 MED ORDER — FENTANYL CITRATE PF 50 MCG/ML IJ SOSY
50.0000 ug | PREFILLED_SYRINGE | Freq: Once | INTRAMUSCULAR | Status: AC
  Administered 2023-12-15: 50 ug via INTRAVENOUS
  Filled 2023-12-15: qty 1

## 2023-12-15 MED ORDER — SODIUM CHLORIDE 0.9 % IV BOLUS
500.0000 mL | Freq: Once | INTRAVENOUS | Status: AC
  Administered 2023-12-15: 500 mL via INTRAVENOUS

## 2023-12-15 MED ORDER — ONDANSETRON HCL 4 MG/2ML IJ SOLN
4.0000 mg | Freq: Once | INTRAMUSCULAR | Status: AC
Start: 2023-12-15 — End: 2023-12-15
  Administered 2023-12-15: 4 mg via INTRAVENOUS
  Filled 2023-12-15: qty 2

## 2023-12-15 MED ORDER — SODIUM CHLORIDE 0.9 % IV SOLN
1.0000 g | Freq: Once | INTRAVENOUS | Status: AC
  Administered 2023-12-15: 1 g via INTRAVENOUS
  Filled 2023-12-15: qty 10

## 2023-12-15 MED ORDER — ONDANSETRON HCL 4 MG PO TABS
4.0000 mg | ORAL_TABLET | Freq: Three times a day (TID) | ORAL | 0 refills | Status: DC | PRN
  Filled 2023-12-15: qty 12, 4d supply, fill #0

## 2023-12-15 NOTE — Patient Instructions (Signed)
 VISIT SUMMARY:  Today, you came in with significant chest and abdominal pain that has been ongoing for several days. Given your history of coronary artery disease, we are taking this very seriously.  YOUR PLAN:  CHEST PAIN WITH UNDERLYING CORONARY ARTERY DISEASE: Your chest pain, which has not been relieved by nitroglycerin , could indicate a worsening of your coronary artery disease or a possible heart attack. -We will give you medication to help with nausea. -You will be transferred to the ER for further testing, including cardiac enzyme tests. -If the tests show any abnormalities, you will likely be transferred to [facility] for possible cardiac catheterization.

## 2023-12-15 NOTE — Assessment & Plan Note (Addendum)
 Family is requesting referral for memory evaluation. Refer to memory clinic. This referral has been placed several times in the past, but unfortunately, she has not followed through with scheduling.

## 2023-12-15 NOTE — Discharge Instructions (Signed)
 Take your Keflex  as prescribed.  You can use your Zofran  as needed for nausea and vomiting.  Follow-up with your primary care doctor in 1 to 2 weeks.  Return to the ER for new or worsening symptoms.

## 2023-12-15 NOTE — ED Triage Notes (Signed)
 Pt presents with cough, chest pain, headache x 2 days

## 2023-12-15 NOTE — Progress Notes (Signed)
 Subjective:     Patient ID: Veronica Wolf, female    DOB: 1941-03-04, 83 y.o.   MRN: [id number]  Chief Complaint  Patient presents with   Chest Pain    Chest Pain     Discussed the use of AI scribe software for clinical note transcription with the patient, who gave verbal consent to proceed.  History of Present Illness  Veronica Wolf is an 83 year old female with coronary artery disease who presents with chest and abdominal pain. She is accompanied by her cousin who is here from [place] and her daughter Kate.  She has experienced significant chest and abdominal pain for 3 days, which began while driving and has persisted without relief. The sensation is described as feeling 'like I'm on the floor', indicating significant discomfort. Her medical history includes coronary artery disease, with a cardiac catheterization in 2023 revealing mild obstructive disease. She had been out of nitroglycerin , but recently obtained a refill. She took a nitroglycerin  about an hour prior to this visit, but it did not alleviate her symptoms.  Family is also concerned about her memory loss and is requesting a memory evaluation.    Health Maintenance Due  Topic Date Due   OPHTHALMOLOGY EXAM  Never done   Zoster Vaccines- Shingrix (1 of 2) Never done   COVID-19 Vaccine (3 - Pfizer risk series) 07/06/2019   Medicare Annual Wellness (AWV)  06/09/2023   Influenza Vaccine  11/04/2023    Past Medical History:  Diagnosis Date   Allergic rhinitis    Allergy    seasonal allergies   Anxiety    CKD (chronic kidney disease) 04/04/2017   COPD (chronic obstructive pulmonary disease) (HCC)    uses inhaler   Depression    Fatty liver    GERD (gastroesophageal reflux disease)    Gout    hx of   Hyperlipidemia    on meds   Hypertension    on meds   Myocardial infarction [facility])    Peptic ulcer 01/04/2003   Vertigo     Past Surgical History:  Procedure Laterality Date   ABDOMINAL  HYSTERECTOMY     APPENDECTOMY     BIOPSY  08/13/2020   Procedure: BIOPSY;  Surgeon: San Sandor GAILS, DO;  Location: [facility] END[facility];  Service: Gastroenterology;;   CORONARY PRESSURE/FFR STUDY N/A 01/27/2022   Procedure: INTRAVASCULAR PRESSURE WIRE/FFR STUDY;  Surgeon: Wendel Lurena POUR, MD;  Location: [facility] INVASIVE CV LAB;  Service: Cardiovascular;  Laterality: N/A;   ESOPHAGOGASTRODUODENOSCOPY (EGD) WITH PROPOFOL  N/A 08/13/2020   Procedure: ESOPHAGOGASTRODUODENOSCOPY (EGD) WITH PROPOFOL ;  Surgeon: San Sandor GAILS, DO;  Location: [facility];  Service: Gastroenterology;  Laterality: N/A;   LEFT HEART CATH AND CORONARY ANGIOGRAPHY N/A 06/13/2017   Procedure: LEFT HEART CATH AND CORONARY ANGIOGRAPHY;  Surgeon: Anner Alm ORN, MD;  Location: [facility] INVASIVE CV LAB;  Service: Cardiovascular;  Laterality: N/A;   LEFT HEART CATH AND CORONARY ANGIOGRAPHY N/A 01/27/2022   Procedure: LEFT HEART CATH AND CORONARY ANGIOGRAPHY;  Surgeon: Wendel Lurena POUR, MD;  Location: [facility] INVASIVE CV LAB;  Service: Cardiovascular;  Laterality: N/A;   RIGHT/LEFT HEART CATH AND CORONARY ANGIOGRAPHY N/A 09/16/2017   Procedure: RIGHT/LEFT HEART CATH AND CORONARY ANGIOGRAPHY;  Surgeon: [place], Peter M, MD;  Location: [facility] INVASIVE CV LAB;  Service: Cardiovascular;  Laterality: N/A;   SAVORY DILATION N/A 08/13/2020   Procedure: SAVORY DILATION;  Surgeon: San Sandor GAILS, DO;  Location: [facility];  Service: Gastroenterology;  Laterality: N/A;  ULTRASOUND GUIDANCE FOR VASCULAR ACCESS  09/16/2017   Procedure: Ultrasound Guidance For Vascular Access;  Surgeon: [place], Peter M, MD;  Location: [facility] INVASIVE CV LAB;  Service: Cardiovascular;;   WISDOM TOOTH EXTRACTION      Family History  Problem Relation Age of Onset   Breast cancer Mother    Stroke Father    Stomach cancer Neg Hx    Colon cancer Neg Hx    Pancreatic cancer Neg Hx    Esophageal cancer Neg Hx    Colon polyps Neg Hx    Rectal cancer Neg Hx     Social History    Socioeconomic History   Marital status: Widowed    Spouse name: Not on file   Number of children: 1   Years of education: 78   Highest education level: 12th grade  Occupational History   Occupation: Retired  Tobacco Use   Smoking status: Former    Current packs/day: 0.00    Types: Cigarettes    Quit date: 04/24/2000    Years since quitting: 23.6    Passive exposure: Past   Smokeless tobacco: Never  Vaping Use   Vaping status: Never Used  Substance and Sexual Activity   Alcohol use: Not Currently    Comment: has previous hx of ETOH abuse, quit 2014   Drug use: No   Sexual activity: Not Currently  Other Topics Concern   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[place]   Grew up in [place]   Has daughter locally   Social Drivers of Health   Financial Resource Strain: Low Risk  (10/07/2021)   Overall Financial Resource Strain (CARDIA)    Difficulty of Paying Living Expenses: Not very hard  Food Insecurity: Patient Declined (08/20/2023)   Hunger Vital Sign    Worried About Running Out of Food in the Last Year: Patient declined    Ran Out of Food in the Last Year: Patient declined  Recent Concern: Food Insecurity - Food Insecurity Present (07/13/2023)   Hunger Vital Sign    Worried About Running Out of Food in the Last Year: Sometimes true    Ran Out of Food in the Last Year: Often true  Transportation Needs: Unmet Transportation Needs (08/20/2023)   PRAPARE - Administrator, Civil Service (Medical): Yes    Lack of Transportation (Non-Medical): Yes  Physical Activity: Inactive (05/13/2021)   Exercise Vital Sign    Days of Exercise per Week: 0 days    Minutes of Exercise per Session: 0 min  Stress: Stress Concern Present (05/13/2021)   [employer] of Occupational Health - Occupational Stress Questionnaire    Feeling of Stress : Rather much  Social Connections: Moderately Integrated (08/20/2023)   Social Connection and  Isolation Panel    Frequency of Communication with Friends and Family: More than three times a week    Frequency of Social Gatherings with Friends and Family: Twice a week    Attends Religious Services: More than 4 times per year    Active Member of [employer] or Organizations: No    Attends Banker Meetings: 1 to 4 times per year    Marital Status: Widowed  Intimate Partner Violence: Not At Risk (08/20/2023)   Humiliation, Afraid, Rape, and Kick questionnaire    Fear of Current or Ex-Partner: No    Emotionally Abused: No    Physically Abused: No    Sexually Abused: No  Outpatient Medications Prior to Visit  Medication Sig Dispense Refill   albuterol  (PROVENTIL ) (2.5 MG/3ML) 0.083% nebulizer solution USE 1 VIAL IN NEBULIZER EVERY 6 HOURS AS NEEDED FOR WHEEZING AND FOR SHORTNESS OF BREATH 150 mL 0   albuterol  (VENTOLIN  HFA) 108 (90 Base) MCG/ACT inhaler Inhale 2 puffs into the lungs every 6 (six) hours as needed for wheezing or shortness of breath. 6.7 g 2   allopurinol  (ZYLOPRIM ) 100 MG tablet Take 2 tablets (200 mg total) by mouth daily. 180 tablet 0   aspirin  81 MG EC tablet Take 1 tablet (81 mg total) by mouth daily. Swallow whole. 30 tablet 12   atorvastatin  (LIPITOR ) 80 MG tablet Take 1 tablet (80 mg total) by mouth daily at 6 PM 90 tablet 0   Blood Glucose Monitoring Suppl (ACCU-CHEK GUIDE ME) w/Device KIT Use to test blood sugar in the morning, at noon, and at bedtime. 1 kit 0   clobetasol  ointment (TEMOVATE ) 0.05 % Apply 1 Application topically 2 (two) times daily. For up to 2 weeks. 30 g 0   colchicine  0.6 MG tablet Take 0.6 mg by mouth daily as needed (gout). (Patient not taking: Reported on 11/10/2023)     diazepam  (VALIUM ) 10 MG tablet Take 1 tablet (10 mg total) by mouth every 12 (twelve) hours as needed for anxiety. 60 tablet 0   dicyclomine  (BENTYL ) 10 MG capsule Take 1 capsule (10 mg total) by mouth 2 (two) times daily. 60 capsule 5   escitalopram  (LEXAPRO ) 20 MG  tablet Take 1 tablet (20 mg total) by mouth daily with lunch 90 tablet 0   ezetimibe  (ZETIA ) 10 MG tablet Take 1 tablet (10 mg total) by mouth daily with lunch. 90 tablet 0   famotidine  (PEPCID ) 20 MG tablet Take 1 tablet (20 mg total) by mouth in the morning and at bedtime. 180 tablet 0   furosemide  (LASIX ) 40 MG tablet Take 0.5 tablets (20 mg total) by mouth daily as needed. Take only for swelling or weight gain (2 lbs in a day or 5 lbs in a week).  Call your PCP for follow up if you are using this frequently. 30 tablet 0   ibuprofen (ADVIL) 200 MG tablet Take 200 mg by mouth every 6 (six) hours as needed for mild pain (pain score 1-3).     isosorbide  mononitrate (IMDUR ) 60 MG 24 hr tablet Take 1 tablet (60 mg total) by mouth daily at 6 PM. 90 tablet 0   levothyroxine  (SYNTHROID ) 25 MCG tablet Take 1 tablet (25 mcg total) by mouth before breakfast. 90 tablet 1   lisinopril  (ZESTRIL ) 2.5 MG tablet Take 1 tablet (2.5 mg total) by mouth daily with lunch. 90 tablet 0   meclizine  (ANTIVERT ) 25 MG tablet Take 1 tablet (25 mg total) by mouth 3 (three) times daily as needed for dizziness. 90 tablet 0   metoprolol  succinate (TOPROL -XL) 25 MG 24 hr tablet Take 0.5 tablets (12.5 mg total) by mouth every evening. 45 tablet 1   nitroGLYCERIN  (NITROSTAT ) 0.4 MG SL tablet Place 1 tablet (0.4 mg total) under the tongue every 5 (five) minutes as needed for chest pain. 45 tablet 3   nystatin  (MYCOSTATIN /NYSTOP ) powder Apply 1 Application topically to the affected area(s) 2 (two) times daily. (Patient taking differently: Apply 1 Application topically 2 (two) times daily as needed (for dryness).) 60 g 4   polyethylene glycol powder (GLYCOLAX /MIRALAX ) 17 GM/SCOOP powder Take 17 g by mouth daily.     potassium chloride  SA (KLOR-CON   M) 20 MEQ tablet Take 1 tablet (20 mEq total) by mouth daily at 12 noon. 90 tablet 0   Probiotic Product (PROBIOTIC DAILY) CAPS Take 1 tablet by mouth daily. 90 capsule 0   Respiratory Therapy  Supplies (NEBULIZER/TUBING/MOUTHPIECE) KIT Use as directed 1 kit 0   senna (SENOKOT) 8.6 MG TABS tablet Take 1 tablet (8.6 mg total) by mouth at bedtime. (Patient taking differently: Take 1 tablet by mouth daily as needed for mild constipation.)     sucralfate  (CARAFATE ) 1 g tablet Take 1 tablet (1 g total) by mouth 4 (four) times daily -  with meals and at bedtime. Take one hour prior to morning medications. 120 tablet 0   valACYclovir  (VALTREX ) 500 MG tablet Take 1 tablet by mouth once every other day 45 tablet 1   No facility-administered medications prior to visit.    Allergies  Allergen Reactions   Ibuprofen Other (See Comments)    Pt has hx of peptic ulcer and diverticulitis.     Review of Systems  Cardiovascular:  Positive for chest pain.       Objective:    Physical Exam Constitutional:      General: She is in acute distress.     Appearance: Normal appearance. She is well-developed. She is ill-appearing.  HENT:     Head: Normocephalic and atraumatic.     Right Ear: External ear normal.     Left Ear: External ear normal.  Eyes:     General: No scleral icterus. Neck:     Thyroid : No thyromegaly.  Cardiovascular:     Rate and Rhythm: Normal rate and regular rhythm.     Heart sounds: Normal heart sounds. No murmur heard. Pulmonary:     Effort: Pulmonary effort is normal. No respiratory distress.     Breath sounds: Normal breath sounds. No wheezing.  Abdominal:     Comments: Nausea/vomiting during exam  Musculoskeletal:     Cervical back: Neck supple.  Skin:    General: Skin is warm and dry.  Neurological:     Mental Status: She is alert and oriented to person, place, and time.  Psychiatric:        Mood and Affect: Mood normal.        Behavior: Behavior normal.        Thought Content: Thought content normal.        Judgment: Judgment normal.      BP 118/78   Pulse 72   Temp 97.9 F (36.6 C)   Resp 20   SpO2 95%  Wt Readings from Last 3 Encounters:   10/20/23 196 lb (88.9 kg)  09/01/23 193 lb 4 oz (87.7 kg)  08/22/23 197 lb 1.5 oz (89.4 kg)       Assessment & Plan:   Problem List Items Addressed This Visit       Unprioritized   Memory loss   Family is requesting referral for memory evaluation. Refer to memory clinic. This referral has been placed several times in the past, but unfortunately, she has not followed through with scheduling.       Relevant Orders   Ambulatory referral to Neurology   Chest pain - Primary   Has hx of MI and CAD.  Has not seen cardiology since 2023.  Took nitroglycerin  1 hour ago without improvement in pain.  EKG is performed and personally interpreted today. Notes NSR with TWI V1-V6.  Pt wheeled down to ED by CMA- accompanied by daughter and cousin for  further evaluation. Report given personally to Sidra RIGGERS on duty at the [facility] ED.       Relevant Orders   EKG 12-Lead (Completed)   Other Visit Diagnoses       Nausea       Relevant Medications   ondansetron  (ZOFRAN ) injection 4 mg (Completed)       I am having Jariya P. Arloa Macintosh maintain her colchicine , aspirin  EC, albuterol , Nebulizer/Tubing/Mouthpiece, Accu-Chek Guide Me, nystatin , clobetasol  ointment, albuterol , Probiotic Daily, meclizine , senna, ibuprofen, polyethylene glycol powder, famotidine , dicyclomine , metoprolol  succinate, furosemide , valACYclovir , diazepam , isosorbide  mononitrate, ezetimibe , lisinopril , potassium chloride  SA, escitalopram , atorvastatin , allopurinol , sucralfate , levothyroxine , and nitroGLYCERIN . We administered ondansetron .  Meds ordered this encounter  Medications   ondansetron  (ZOFRAN ) injection 4 mg

## 2023-12-15 NOTE — Assessment & Plan Note (Addendum)
 Has hx of MI and CAD.  Has not seen cardiology since 2023.  Took nitroglycerin  1 hour ago without improvement in pain.  EKG is performed and personally interpreted today. Notes NSR with TWI V1-V6.  Pt wheeled down to ED by CMA- accompanied by daughter and cousin for further evaluation. Report given personally to Sidra RIGGERS on duty at the [facility] ED.

## 2023-12-15 NOTE — ED Provider Notes (Signed)
 [place] [facility] AT [facility] Provider Note   CSN: [id number] Arrival date & time: 12/15/23  1627     Patient presents with: Chest Pain   Veronica Wolf is a 83 y.o. female.   83 year old female presents from office upstairs for evaluation of chest pain.  She states she has had upper abdominal pain as well as some nausea and vomiting.  She appears fairly uncomfortable.  Has also had a headache and cough for the last few days.  Admits to some mild shortness of breath as well.  Family at bedside helps provide history.   Chest Pain Associated symptoms: abdominal pain, nausea and vomiting   Associated symptoms: no back pain, no cough, no fever, no palpitations and no shortness of breath        Prior to Admission medications   Medication Sig Start Date End Date Taking? Authorizing Provider  cephALEXin  (KEFLEX ) 500 MG capsule Take 1 capsule (500 mg total) by mouth 4 (four) times daily for 7 days. 12/15/23 12/22/23 Yes Vaanya Shambaugh L, DO  ondansetron  (ZOFRAN ) 4 MG tablet Take 1 tablet (4 mg total) by mouth every 8 (eight) hours as needed for up to 4 days. 12/15/23 12/19/23 Yes Mykai Wendorf L, DO  albuterol  (PROVENTIL ) (2.5 MG/3ML) 0.083% nebulizer solution USE 1 VIAL IN NEBULIZER EVERY 6 HOURS AS NEEDED FOR WHEEZING AND FOR SHORTNESS OF BREATH 03/20/21   O'Sullivan, Melissa, NP  albuterol  (VENTOLIN  HFA) 108 (90 Base) MCG/ACT inhaler Inhale 2 puffs into the lungs every 6 (six) hours as needed for wheezing or shortness of breath. 11/29/22   Daryl Setter, NP  allopurinol  (ZYLOPRIM ) 100 MG tablet Take 2 tablets (200 mg total) by mouth daily. 12/06/23   Daryl Setter, NP  aspirin  81 MG EC tablet Take 1 tablet (81 mg total) by mouth daily. Swallow whole. 10/28/20   Tobb, Kardie, DO  atorvastatin  (LIPITOR ) 80 MG tablet Take 1 tablet (80 mg total) by mouth daily at 6 PM 12/06/23   O'Sullivan, Melissa, NP  Blood Glucose Monitoring Suppl (ACCU-CHEK GUIDE ME)  w/Device KIT Use to test blood sugar in the morning, at noon, and at bedtime. 07/26/22   O'Sullivan, Melissa, NP  clobetasol  ointment (TEMOVATE ) 0.05 % Apply 1 Application topically 2 (two) times daily. For up to 2 weeks. 11/17/22   Daryl Setter, NP  colchicine  0.6 MG tablet Take 0.6 mg by mouth daily as needed (gout). Patient not taking: Reported on 11/10/2023    [provider]  diazepam  (VALIUM ) 10 MG tablet Take 1 tablet (10 mg total) by mouth every 12 (twelve) hours as needed for anxiety. 12/06/23   O'Sullivan, Melissa, NP  dicyclomine  (BENTYL ) 10 MG capsule Take 1 capsule (10 mg total) by mouth 2 (two) times daily. 09/30/23   O'Sullivan, Melissa, NP  escitalopram  (LEXAPRO ) 20 MG tablet Take 1 tablet (20 mg total) by mouth daily with lunch 12/06/23   O'Sullivan, Melissa, NP  ezetimibe  (ZETIA ) 10 MG tablet Take 1 tablet (10 mg total) by mouth daily with lunch. 12/06/23   O'Sullivan, Melissa, NP  famotidine  (PEPCID ) 20 MG tablet Take 1 tablet (20 mg total) by mouth in the morning and at bedtime. 09/30/23   O'Sullivan, Melissa, NP  furosemide  (LASIX ) 40 MG tablet Take 0.5 tablets (20 mg total) by mouth daily as needed. Take only for swelling or weight gain (2 lbs in a day or 5 lbs in a week).  Call your PCP for follow up if you are using this frequently.  11/29/23   O'Sullivan, Melissa, NP  ibuprofen (ADVIL) 200 MG tablet Take 200 mg by mouth every 6 (six) hours as needed for mild pain (pain score 1-3).    [provider]  isosorbide  mononitrate (IMDUR ) 60 MG 24 hr tablet Take 1 tablet (60 mg total) by mouth daily at 6 PM. 12/06/23   O'Sullivan, Melissa, NP  levothyroxine  (SYNTHROID ) 25 MCG tablet Take 1 tablet (25 mcg total) by mouth before breakfast. 12/06/23   Domenica Harlene LABOR, MD  lisinopril  (ZESTRIL ) 2.5 MG tablet Take 1 tablet (2.5 mg total) by mouth daily with lunch. 12/06/23   O'Sullivan, Melissa, NP  meclizine  (ANTIVERT ) 25 MG tablet Take 1 tablet (25 mg total) by mouth 3 (three) times  daily as needed for dizziness. 12/30/22   O'Sullivan, Melissa, NP  metoprolol  succinate (TOPROL -XL) 25 MG 24 hr tablet Take 0.5 tablets (12.5 mg total) by mouth every evening. 11/08/23   O'Sullivan, Melissa, NP  nitroGLYCERIN  (NITROSTAT ) 0.4 MG SL tablet Place 1 tablet (0.4 mg total) under the tongue every 5 (five) minutes as needed for chest pain. 12/14/23   O'Sullivan, Melissa, NP  nystatin  (MYCOSTATIN /NYSTOP ) powder Apply 1 Application topically to the affected area(s) 2 (two) times daily. Patient taking differently: Apply 1 Application topically 2 (two) times daily as needed (for dryness). 11/14/22   O'Sullivan, Melissa, NP  polyethylene glycol powder (GLYCOLAX /MIRALAX ) 17 GM/SCOOP powder Take 17 g by mouth daily.    [provider]  potassium chloride  SA (KLOR-CON  M) 20 MEQ tablet Take 1 tablet (20 mEq total) by mouth daily at 12 noon. 12/06/23   O'Sullivan, Melissa, NP  Probiotic Product (PROBIOTIC DAILY) CAPS Take 1 tablet by mouth daily. 11/30/22   Almarie Waddell NOVAK, NP  Respiratory Therapy Supplies (NEBULIZER/TUBING/MOUTHPIECE) KIT Use as directed 08/26/21   Daryl Setter, NP  senna (SENOKOT) 8.6 MG TABS tablet Take 1 tablet (8.6 mg total) by mouth at bedtime. Patient taking differently: Take 1 tablet by mouth daily as needed for mild constipation. 07/19/23   O'Sullivan, Melissa, NP  sucralfate  (CARAFATE ) 1 g tablet Take 1 tablet (1 g total) by mouth 4 (four) times daily -  with meals and at bedtime. Take one hour prior to morning medications. 12/06/23   O'Sullivan, Melissa, NP  valACYclovir  (VALTREX ) 500 MG tablet Take 1 tablet by mouth once every other day 12/06/23   O'Sullivan, Melissa, NP    Allergies: Ibuprofen    Review of Systems  Constitutional:  Negative for chills and fever.  HENT:  Negative for ear pain and sore throat.   Eyes:  Negative for pain and visual disturbance.  Respiratory:  Negative for cough and shortness of breath.   Cardiovascular:  Positive for chest pain.  Negative for palpitations.  Gastrointestinal:  Positive for abdominal pain, nausea and vomiting.  Genitourinary:  Negative for dysuria and hematuria.  Musculoskeletal:  Negative for arthralgias and back pain.  Skin:  Negative for color change and rash.  Neurological:  Negative for seizures and syncope.  All other systems reviewed and are negative.   Updated Vital Signs BP (!) 171/62   Pulse (!) 59   Temp 98.5 F (36.9 C) (Oral)   Resp 13   Ht 5' 4 (1.626 m)   Wt 90.7 kg   SpO2 100%   BMI 34.33 kg/m   Physical Exam Vitals and nursing note reviewed.  Constitutional:      General: She is not in acute distress.    Appearance: She is well-developed. She is ill-appearing.  HENT:     Head: Normocephalic and atraumatic.  Eyes:     Conjunctiva/sclera: Conjunctivae normal.  Cardiovascular:     Rate and Rhythm: Normal rate and regular rhythm.     Heart sounds: No murmur heard. Pulmonary:     Effort: Pulmonary effort is normal. No respiratory distress.     Breath sounds: Normal breath sounds.  Abdominal:     Palpations: Abdomen is soft.     Tenderness: There is no abdominal tenderness.  Musculoskeletal:        General: No swelling.     Cervical back: Neck supple.  Skin:    General: Skin is warm and dry.     Capillary Refill: Capillary refill takes less than 2 seconds.  Neurological:     Mental Status: She is alert.  Psychiatric:        Mood and Affect: Mood normal.     (all labs ordered are listed, but only abnormal results are displayed) Labs Reviewed  BASIC METABOLIC PANEL WITH GFR - Abnormal; Notable for the following components:      Result Value   CO2 21 (*)    Glucose, Bld 131 (*)    Creatinine, Ser 1.21 (*)    Calcium  8.7 (*)    GFR, Estimated 44 (*)    All other components within normal limits  CBC - Abnormal; Notable for the following components:   WBC 11.6 (*)    RDW 16.1 (*)    All other components within normal limits  URINALYSIS, W/ REFLEX TO  CULTURE (INFECTION SUSPECTED) - Abnormal; Notable for the following components:   Hgb urine dipstick TRACE (*)    Nitrite POSITIVE (*)    Leukocytes,Ua SMALL (*)    Bacteria, UA FEW (*)    All other components within normal limits  RESP PANEL BY RT-PCR (RSV, FLU A&B, COVID)  RVPGX2  URINE CULTURE  LACTIC ACID, PLASMA  LIPASE, BLOOD  HEPATIC FUNCTION PANEL  LACTIC ACID, PLASMA  TROPONIN T, HIGH SENSITIVITY  TROPONIN T, HIGH SENSITIVITY    EKG: EKG Interpretation Date/Time:  Thursday December 15 2023 16:38:37 EDT Ventricular Rate:  72 PR Interval:  237 QRS Duration:  81 QT Interval:  542 QTC Calculation: 594 R Axis:   -15  Text Interpretation: Sinus rhythm Prolonged PR interval left axis deviation Borderline T abnormalities, diffuse leads Prolonged QT interval Confirmed by Gennaro Bouchard ([id number]) on 12/15/2023 4:44:22 PM  Radiology: CT ABDOMEN PELVIS W CONTRAST Result Date: 12/15/2023 CLINICAL DATA:  Upper abdominal pain, nausea, vomiting. EXAM: CT ABDOMEN AND PELVIS WITH CONTRAST TECHNIQUE: Multidetector CT imaging of the abdomen and pelvis was performed using the standard protocol following bolus administration of intravenous contrast. RADIATION DOSE REDUCTION: This exam was performed according to the departmental dose-optimization program which includes automated exposure control, adjustment of the mA and/or kV according to patient size and/or use of iterative reconstruction technique. CONTRAST:  85mL OMNIPAQUE  IOHEXOL  300 MG/ML  SOLN COMPARISON:  November 06, 2021. FINDINGS: Lower chest: No acute abnormality. Hepatobiliary: No focal liver abnormality is seen. No gallstones, gallbladder wall thickening, or biliary dilatation. Pancreas: Unremarkable. No pancreatic ductal dilatation or surrounding inflammatory changes. Spleen: Normal in size without focal abnormality. Adrenals/Urinary Tract: Adrenal glands are unremarkable. Kidneys are normal, without renal calculi, focal lesion, or  hydronephrosis. Bladder is unremarkable. Stomach/Bowel: The stomach is unremarkable. Status post appendectomy. There is no evidence of bowel obstruction. Sigmoid diverticulosis is noted without inflammation. Vascular/Lymphatic: Aortic atherosclerosis. No enlarged abdominal or pelvic lymph nodes. Reproductive: Status  post hysterectomy. No adnexal masses. Other: No abdominal wall hernia or abnormality. No abdominopelvic ascites. Musculoskeletal: No acute or significant osseous findings. IMPRESSION: 1. Sigmoid diverticulosis without inflammation. 2. No acute abnormality seen in the abdomen or pelvis. 3. Aortic atherosclerosis. Aortic Atherosclerosis (ICD10-I70.0). Electronically Signed   By: Lynwood Landy Raddle M.D.   On: 12/15/2023 18:28   DG Chest 2 View Result Date: 12/15/2023 CLINICAL DATA:  Cough with chest pain and headaches for 2 days. EXAM: CHEST - 2 VIEW COMPARISON:  Radiographs 08/18/2023 and 03/19/2022.  CT 08/18/2023. FINDINGS: Lordotic positioning on the frontal examination. The heart size and mediastinal contours are stable. The lungs appear clear. No evidence of pleural effusion or pneumothorax. No acute osseous findings are evident. There are mild degenerative changes in the spine. IMPRESSION: No evidence of active cardiopulmonary process. Electronically Signed   By: Elsie Perone M.D.   On: 12/15/2023 18:11     Procedures   Medications Ordered in the ED  sodium chloride  0.9 % bolus 500 mL (500 mLs Intravenous New Bag/Given 12/15/23 1718)  ondansetron  (ZOFRAN ) injection 4 mg (4 mg Intravenous Given 12/15/23 1712)  fentaNYL  (SUBLIMAZE ) injection 50 mcg (50 mcg Intravenous Given 12/15/23 1714)  iohexol  (OMNIPAQUE ) 300 MG/ML solution 85 mL (85 mLs Intravenous Contrast Given 12/15/23 1805)  cefTRIAXone  (ROCEPHIN ) 1 g in sodium chloride  0.9 % 100 mL IVPB (1 g Intravenous New Bag/Given 12/15/23 2130)  ondansetron  (ZOFRAN ) injection 4 mg (4 mg Intravenous Given 12/15/23 2215)                                     Medical Decision Making Cardiac monitor interpretation: Sinus rhythm, no ectopy  Social determinants of health: Patient lives by herself  Patient's lab workup reviewed by me she has a UTI but labs are otherwise fairly unremarkable, lactate is negative, troponins negative x 2 and CT abdomen pel without any acute abnormality.  Received Zofran  and IV fluids and feeling much better.  I gave her a dose of Rocephin  here and will start her on Keflex  for UTI.  Will give her prescription for Zofran  as well.  Advised to increase her fluid intake and given strict return precautions.  Advised to follow-up primary care doctor.  She will stay with her daughters tonight.  Patient and family at bedside feel comfortable with plan to be discharged home.  Results and plan discussed with family and patient.  Problems Addressed: Atypical chest pain: acute illness or injury Nausea: acute illness or injury Urinary tract infection without hematuria, site unspecified: acute illness or injury  Amount and/or Complexity of Data Reviewed External Data Reviewed: notes.    Details: Outpatient records reviewed and patient was sent from the office for further workup Labs: ordered. Decision-making details documented in ED Course.    Details: Ordered and reviewed by me and patient with a UTI but labs otherwise fairly unremarkable Radiology: ordered and independent interpretation performed. Decision-making details documented in ED Course.    Details: Ordered and reviewed by me and CT abdomen pelvis shows no acute abnormality ECG/medicine tests: ordered and independent interpretation performed. Decision-making details documented in ED Course.    Details: Ordered and reviewed by me and patient with some nonspecific T wave abnormalities but no other acute abnormality when compared to prior EKG  Risk OTC drugs. Prescription drug management. Drug therapy requiring intensive monitoring for toxicity. Diagnosis or  treatment significantly limited by social determinants of  health.     Final diagnoses:  Urinary tract infection without hematuria, site unspecified  Atypical chest pain  Nausea    ED Discharge Orders          Ordered    ondansetron  (ZOFRAN ) 4 MG tablet  Every 8 hours PRN        12/15/23 2108    cephALEXin  (KEFLEX ) 500 MG capsule  4 times daily        12/15/23 2108               Gennaro Duwaine CROME, DO 12/15/23 2224

## 2023-12-16 ENCOUNTER — Other Ambulatory Visit (HOSPITAL_COMMUNITY): Payer: Self-pay

## 2023-12-16 ENCOUNTER — Telehealth: Payer: Self-pay

## 2023-12-16 ENCOUNTER — Other Ambulatory Visit: Payer: Self-pay

## 2023-12-16 NOTE — Telephone Encounter (Signed)
 Copied from CRM [id number]. Topic: Clinical - Medication Question >> Dec 16, 2023  2:12 PM Harlene ORN wrote: Reason for CRM: Patient called. Has questions about how and when she should take her medicine.

## 2023-12-16 NOTE — Telephone Encounter (Signed)
 Patient has not pick up medication at the pharmacy, she was advised to pick up. Every medication will have instructions on it

## 2023-12-18 LAB — URINE CULTURE: Culture: >=100000 — AB

## 2023-12-19 ENCOUNTER — Other Ambulatory Visit: Payer: Self-pay

## 2023-12-19 ENCOUNTER — Telehealth: Payer: Self-pay | Admitting: Pharmacist

## 2023-12-19 ENCOUNTER — Telehealth (HOSPITAL_BASED_OUTPATIENT_CLINIC_OR_DEPARTMENT_OTHER): Payer: Self-pay | Admitting: *Deleted

## 2023-12-19 ENCOUNTER — Other Ambulatory Visit (HOSPITAL_COMMUNITY): Payer: Self-pay

## 2023-12-19 NOTE — Telephone Encounter (Signed)
 Receive VM from Mrs. Jablonowski asking about her adherence packaging and when it would be delivered. Left message 12/09/2023 . Per adherence pack team her current box should have lasted until today 12/19/2023. Sent message to Ester Dickinson to see where they are on her upcoming box / adherence packs.   Tried to call patient but no answer. LM on VM that I was checking on her adherence packs and when to expect next delivery.  Also asked if she had received / picked up cephalexin  Rx from 9//03/2024.    Patient called back - she states that she did not received her last adherence packs. There was picture confirmation that it was delivered 11/10/2023. Patient still states that she has not received this delivery.   Reached out to [facility] regarding antibiotic and ondansetron  - they report Rx was filled and shipped 12/16/2023 - should arrive today 12/19/2023

## 2023-12-19 NOTE — Telephone Encounter (Signed)
 Post ED Visit - Positive Culture Follow-up  Culture report reviewed by antimicrobial stewardship pharmacist: [facility] [x]  Vito Ralph, [place].D. []  Venetia Gully, Pharm.D., BCPS AQ-ID []  Garrel Crews, Pharm.D., BCPS []  Almarie Lunger, Pharm.D., BCPS []  [place], [address].D., BCPS, AAHIVP []  Rosaline Bihari, Pharm.D., BCPS, AAHIVP []  Vernell Meier, PharmD, BCPS []  Latanya Hint, PharmD, BCPS []  Donald Medley, PharmD, BCPS []  Rocky Bold, PharmD []  Dorothyann Alert, PharmD, BCPS []  Morene Babe, PharmD  [facility] []  Rosaline Edison, PharmD []  Romona Bliss, PharmD []  Dolphus Roller, PharmD []  Veva Seip, Rph []  Vernell Daunt) Leonce, PharmD []  Eva Allis, PharmD []  Rosaline Millet, PharmD []  Iantha Batch, PharmD []  Arvin Gauss, PharmD []  Wanda Hasting, PharmD []  Ronal Rav, PharmD []  Rocky Slade, PharmD []  Bard Jeans, PharmD   Positive urine culture Treated with cephalexin , organism sensitive to the same and no further patient follow-up is required at this time.  Lorita Barnie Pereyra 12/19/2023, 9:28 AM

## 2023-12-20 ENCOUNTER — Encounter (HOSPITAL_COMMUNITY): Payer: Self-pay

## 2023-12-20 ENCOUNTER — Emergency Department (HOSPITAL_COMMUNITY)

## 2023-12-20 ENCOUNTER — Other Ambulatory Visit (HOSPITAL_COMMUNITY): Payer: Self-pay

## 2023-12-20 ENCOUNTER — Other Ambulatory Visit: Payer: Self-pay

## 2023-12-20 ENCOUNTER — Emergency Department (HOSPITAL_COMMUNITY)
Admission: EM | Admit: 2023-12-20 | Discharge: 2023-12-20 | Disposition: A | Discharge status: Home / Self Care | Attending: Emergency Medicine | Admitting: Emergency Medicine

## 2023-12-20 ENCOUNTER — Ambulatory Visit: Payer: Self-pay

## 2023-12-20 DIAGNOSIS — N189 Chronic kidney disease, unspecified: Secondary | ICD-10-CM | POA: Insufficient documentation

## 2023-12-20 DIAGNOSIS — I129 Hypertensive chronic kidney disease with stage 1 through stage 4 chronic kidney disease, or unspecified chronic kidney disease: Secondary | ICD-10-CM | POA: Diagnosis not present

## 2023-12-20 DIAGNOSIS — K5732 Diverticulitis of large intestine without perforation or abscess without bleeding: Secondary | ICD-10-CM | POA: Insufficient documentation

## 2023-12-20 DIAGNOSIS — Z79899 Other long term (current) drug therapy: Secondary | ICD-10-CM | POA: Insufficient documentation

## 2023-12-20 DIAGNOSIS — J449 Chronic obstructive pulmonary disease, unspecified: Secondary | ICD-10-CM | POA: Insufficient documentation

## 2023-12-20 DIAGNOSIS — R7989 Other specified abnormal findings of blood chemistry: Secondary | ICD-10-CM | POA: Diagnosis not present

## 2023-12-20 DIAGNOSIS — D72829 Elevated white blood cell count, unspecified: Secondary | ICD-10-CM | POA: Insufficient documentation

## 2023-12-20 DIAGNOSIS — R0789 Other chest pain: Secondary | ICD-10-CM | POA: Diagnosis not present

## 2023-12-20 DIAGNOSIS — K5792 Diverticulitis of intestine, part unspecified, without perforation or abscess without bleeding: Secondary | ICD-10-CM

## 2023-12-20 DIAGNOSIS — Z7982 Long term (current) use of aspirin: Secondary | ICD-10-CM | POA: Diagnosis not present

## 2023-12-20 DIAGNOSIS — R1032 Left lower quadrant pain: Secondary | ICD-10-CM | POA: Diagnosis not present

## 2023-12-20 DIAGNOSIS — R1030 Lower abdominal pain, unspecified: Secondary | ICD-10-CM | POA: Diagnosis present

## 2023-12-20 DIAGNOSIS — K5730 Diverticulosis of large intestine without perforation or abscess without bleeding: Secondary | ICD-10-CM | POA: Diagnosis not present

## 2023-12-20 DIAGNOSIS — R079 Chest pain, unspecified: Secondary | ICD-10-CM | POA: Diagnosis not present

## 2023-12-20 LAB — BASIC METABOLIC PANEL WITH GFR
Anion gap: 11 (ref 5–15)
BUN: 16 mg/dL (ref 8–23)
CO2: 20 mmol/L — ABNORMAL LOW (ref 22–32)
Calcium: 9.2 mg/dL (ref 8.9–10.3)
Chloride: 109 mmol/L (ref 98–111)
Creatinine, Ser: 1.56 mg/dL — ABNORMAL HIGH (ref 0.44–1.00)
GFR, Estimated: 33 mL/min — ABNORMAL LOW (ref >60)
Glucose, Bld: 119 mg/dL — ABNORMAL HIGH (ref 70–99)
Potassium: 3.4 mmol/L — ABNORMAL LOW (ref 3.5–5.1)
Sodium: 140 mmol/L (ref 135–145)

## 2023-12-20 LAB — CBC
HCT: 43.2 % (ref 36.0–46.0)
Hemoglobin: 13.5 g/dL (ref 12.0–15.0)
MCH: 28 pg (ref 26.0–34.0)
MCHC: 31.3 g/dL (ref 30.0–36.0)
MCV: 89.6 fL (ref 80.0–100.0)
Platelets: 244 K/uL (ref 150–400)
RBC: 4.82 MIL/uL (ref 3.87–5.11)
RDW: 15.9 % — ABNORMAL HIGH (ref 11.5–15.5)
WBC: 15 K/uL — ABNORMAL HIGH (ref 4.0–10.5)
nRBC: 0 % (ref 0.0–0.2)

## 2023-12-20 LAB — HEPATIC FUNCTION PANEL
ALT: 11 U/L (ref 0–44)
AST: 20 U/L (ref 15–41)
Albumin: 3.4 g/dL — ABNORMAL LOW (ref 3.5–5.0)
Alkaline Phosphatase: 55 U/L (ref 38–126)
Bilirubin, Direct: <0.1 mg/dL (ref 0.0–0.2)
Total Bilirubin: <0.2 mg/dL (ref 0.0–1.2)
Total Protein: 7.2 g/dL (ref 6.5–8.1)

## 2023-12-20 LAB — TROPONIN I (HIGH SENSITIVITY)
Troponin I (High Sensitivity): 15 ng/L (ref <18)
Troponin I (High Sensitivity): 15 ng/L (ref <18)

## 2023-12-20 LAB — LIPASE, BLOOD: Lipase: 45 U/L (ref 11–51)

## 2023-12-20 MED ORDER — SODIUM CHLORIDE 0.9 % IV BOLUS
500.0000 mL | Freq: Once | INTRAVENOUS | Status: AC
  Administered 2023-12-20: 500 mL via INTRAVENOUS

## 2023-12-20 MED ORDER — ONDANSETRON 4 MG PO TBDP
4.0000 mg | ORAL_TABLET | Freq: Once | ORAL | Status: AC
  Administered 2023-12-20: 4 mg via ORAL
  Filled 2023-12-20: qty 1

## 2023-12-20 MED ORDER — AMOXICILLIN-POT CLAVULANATE 875-125 MG PO TABS
1.0000 | ORAL_TABLET | Freq: Two times a day (BID) | ORAL | 0 refills | Status: DC
  Filled 2023-12-20 (×2): qty 14, 7d supply, fill #0

## 2023-12-20 MED ORDER — PANTOPRAZOLE SODIUM 40 MG IV SOLR
40.0000 mg | Freq: Once | INTRAVENOUS | Status: AC
Start: 2023-12-20 — End: 2023-12-20
  Administered 2023-12-20: 40 mg via INTRAVENOUS
  Filled 2023-12-20: qty 10

## 2023-12-20 MED ORDER — OXYCODONE HCL 5 MG PO TABS
5.0000 mg | ORAL_TABLET | Freq: Four times a day (QID) | ORAL | Status: DC | PRN
  Administered 2023-12-20: 5 mg via ORAL
  Filled 2023-12-20 (×2): qty 1

## 2023-12-20 NOTE — ED Notes (Signed)
 Pt declines any medication for pain at this time.

## 2023-12-20 NOTE — ED Triage Notes (Signed)
 Pt is here for upper abdominal pain and epigastric pain.  No sob.  Pt is currently taking an antibiotic for UTI

## 2023-12-20 NOTE — Discharge Instructions (Signed)
 Your kidney function was mildly increased.  Please follow-up with your primary care doctor within the next few days to make sure that your abdominal pain is getting better and so that they can keep an eye on your kidney function.  Return to the emergency room if you have any worsening symptoms.

## 2023-12-20 NOTE — Telephone Encounter (Signed)
 FYI Only or Action Required?: FYI only for provider.  Patient was last seen in primary care on 12/15/2023 by Daryl Setter, NP.  Called Nurse Triage reporting Shortness of Breath.  Symptoms began today.  Interventions attempted: Nothing.  Symptoms are: rapidly worsening.  Triage Disposition: Go to ED Now (Notify PCP)  Patient/caregiver understands and will follow disposition?: Yes  Copied from CRM [id number]. Topic: Clinical - Red Word Triage >> Dec 20, 2023  9:31 AM Veronica Wolf wrote: Red Word that prompted transfer to Nurse Triage: Patient states she has been having chest pains all night last night and could not get any sleep last night.  States she is not having trouble breathing-but patient sounds like she is short of breath when speaking. Reason for Disposition  [1] MODERATE difficulty breathing (e.g., speaks in phrases, SOB even at rest, pulse 100-120) AND [2] NEW-onset or WORSE than normal  Answer Assessment - Initial Assessment Questions 1. RESPIRATORY STATUS: Describe your breathing? (e.g., wheezing, shortness of breath, unable to speak, severe coughing)      Shortness of breath  2. ONSET: When did this breathing problem begin?      Worsening over the past 5 days (seen on 9/11 and breathing was characterized as Mild  3. PATTERN Does the difficult breathing come and go, or has it been constant since it started?      Constant  4. SEVERITY: How bad is your breathing? (e.g., mild, moderate, severe)      Moderate  5. RECURRENT SYMPTOM: Have you had difficulty breathing before? If Yes, ask: When was the last time? and What happened that time?      Yes, chest pain and SOB was reported on 9/11 OV. Pt had no improvement when given nitroglycerin  and pt was transported to ED  6. CARDIAC HISTORY: Do you have any history of heart disease? (e.g., heart attack, angina, bypass surgery, angioplasty)      Hx of MI, CAD- no f/u with cardiology since 2023 (per chart  review)  7. LUNG HISTORY: Do you have any history of lung disease?  (e.g., pulmonary embolus, asthma, emphysema)     No  8. CAUSE: What do you think is causing the breathing problem?      Unsure, patient expresses feeling nervous  9. OTHER SYMPTOMS: Do you have any other symptoms? (e.g., chest pain, cough, dizziness, fever, runny nose)     Moderate to severe chest pain, from upper abdomen to middle of chest  10. O2 SATURATION MONITOR:  Do you use an oxygen saturation monitor (pulse oximeter) at home? If Yes, ask: What is your reading (oxygen level) today? What is your usual oxygen saturation reading? (e.g., 95%)       Unk  11. PREGNANCY: Is there any chance you are pregnant? When was your last menstrual period?       No  12. TRAVEL: Have you traveled out of the country in the last month? (e.g., travel history, exposures)       No  Protocols used: Breathing Difficulty-A-AH

## 2023-12-20 NOTE — ED Provider Triage Note (Signed)
 Emergency Medicine Provider Triage Evaluation Note  HENLEIGH ROBELLO , a 83 y.o. female  was evaluated in triage.  Pt complains of 3 days of epigastric pain radiates to chest that comes and goes. Has been taking Nitroglycerin  which helps? No NVD. No fever.   Review of Systems  Positive: CP Negative: N  Physical Exam  BP 122/64 (BP Location: Left Arm)   Pulse 96   Temp 98.4 F (36.9 C)   Resp 19   Ht 5' 5 (1.651 m)   Wt 86.2 kg   SpO2 94%   BMI 31.62 kg/m  Gen:   Awake, very uncomfortable  Resp:  Normal effort  MSK:   Moves extremities without difficulty  Other:    Medical Decision Making  Medically screening exam initiated at 12:12 PM.  Appropriate orders placed.  JENIECE HANNIS was informed that the remainder of the evaluation will be completed by another provider, this initial triage assessment does not replace that evaluation, and the importance of remaining in the ED until their evaluation is complete.  Ordered placed, patient will stay in triage until room in ER open.    Shermon Warren SAILOR, PA-C 12/20/23 1214

## 2023-12-20 NOTE — ED Provider Notes (Signed)
 [facility] AT [facility] Provider Note   CSN: [id number] Arrival date & time: 12/20/23  1025     Patient presents with: Abdominal Pain and Chest Pain   Veronica Wolf is a 83 y.o. female.   Patient is a 83 year old who presents with abdominal pain.  She says been going on for the last several days.  She describes it as a pain in her lower abdomen that radiates up through her abdomen and up to her chest.  She denies any discrete chest pain.  She does look a little tachypneic but she denies any shortness of breath.  Her daughter who is at bedside says that this is typical breathing for her.  She does have oxygen at home to use as needed and usually uses it about every other day.  She denies any recent cough or cold symptoms.  She does have some constipation which is a little bit out of her norm.  She denies any fevers.  She had 1 episode of vomiting yesterday but has not had any today.  She said she had an intense pain in her left abdomen/flank area earlier when she arrived but that has subsided.  She was seen in the ED on September 11 for similar symptoms.  She was diagnosed with a UTI at that point and has been on Keflex .  She had a CT of abdomen pelvis with contrast that was negative for acute abnormality.  Of note she did have diverticulosis but no concerns for diverticulitis on that scan.       Prior to Admission medications   Medication Sig Start Date End Date Taking? Authorizing Provider  amoxicillin -clavulanate (AUGMENTIN ) 875-125 MG tablet Take 1 tablet by mouth every 12 (twelve) hours. 12/20/23  Yes Lenor Hollering, MD  albuterol  (PROVENTIL ) (2.5 MG/3ML) 0.083% nebulizer solution USE 1 VIAL IN NEBULIZER EVERY 6 HOURS AS NEEDED FOR WHEEZING AND FOR SHORTNESS OF BREATH 03/20/21   O'Sullivan, Melissa, NP  albuterol  (VENTOLIN  HFA) 108 (90 Base) MCG/ACT inhaler Inhale 2 puffs into the lungs every 6 (six) hours as needed for wheezing or shortness of breath.  11/29/22   Daryl Setter, NP  allopurinol  (ZYLOPRIM ) 100 MG tablet Take 2 tablets (200 mg total) by mouth daily. 12/06/23   Daryl Setter, NP  aspirin  81 MG EC tablet Take 1 tablet (81 mg total) by mouth daily. Swallow whole. 10/28/20   Tobb, Kardie, DO  atorvastatin  (LIPITOR ) 80 MG tablet Take 1 tablet (80 mg total) by mouth daily at 6 PM 12/06/23   O'Sullivan, Melissa, NP  Blood Glucose Monitoring Suppl (ACCU-CHEK GUIDE ME) w/Device KIT Use to test blood sugar in the morning, at noon, and at bedtime. 07/26/22   O'Sullivan, Melissa, NP  cephALEXin  (KEFLEX ) 500 MG capsule Take 1 capsule (500 mg total) by mouth 4 (four) times daily for 7 days. 12/15/23 12/23/23  Kammerer, Megan L, DO  clobetasol  ointment (TEMOVATE ) 0.05 % Apply 1 Application topically 2 (two) times daily. For up to 2 weeks. 11/17/22   O'Sullivan, Melissa, NP  colchicine  0.6 MG tablet Take 0.6 mg by mouth daily as needed (gout). Patient not taking: Reported on 11/10/2023    [provider]  diazepam  (VALIUM ) 10 MG tablet Take 1 tablet (10 mg total) by mouth every 12 (twelve) hours as needed for anxiety. 12/06/23   O'Sullivan, Melissa, NP  dicyclomine  (BENTYL ) 10 MG capsule Take 1 capsule (10 mg total) by mouth 2 (two) times daily. 09/30/23   O'Sullivan, Melissa, NP  escitalopram  (LEXAPRO ) 20 MG tablet Take 1 tablet (20 mg total) by mouth daily with lunch 12/06/23   O'Sullivan, Melissa, NP  ezetimibe  (ZETIA ) 10 MG tablet Take 1 tablet (10 mg total) by mouth daily with lunch. 12/06/23   O'Sullivan, Melissa, NP  famotidine  (PEPCID ) 20 MG tablet Take 1 tablet (20 mg total) by mouth in the morning and at bedtime. 09/30/23   O'Sullivan, Melissa, NP  furosemide  (LASIX ) 40 MG tablet Take 0.5 tablets (20 mg total) by mouth daily as needed. Take only for swelling or weight gain (2 lbs in a day or 5 lbs in a week).  Call your PCP for follow up if you are using this frequently. 11/29/23   O'Sullivan, Melissa, NP  ibuprofen (ADVIL) 200 MG tablet  Take 200 mg by mouth every 6 (six) hours as needed for mild pain (pain score 1-3).    [provider]  isosorbide  mononitrate (IMDUR ) 60 MG 24 hr tablet Take 1 tablet (60 mg total) by mouth daily at 6 PM. 12/06/23   Daryl Setter, NP  levothyroxine  (SYNTHROID ) 25 MCG tablet Take 1 tablet (25 mcg total) by mouth before breakfast. 12/06/23   Domenica Harlene LABOR, MD  lisinopril  (ZESTRIL ) 2.5 MG tablet Take 1 tablet (2.5 mg total) by mouth daily with lunch. 12/06/23   O'Sullivan, Melissa, NP  meclizine  (ANTIVERT ) 25 MG tablet Take 1 tablet (25 mg total) by mouth 3 (three) times daily as needed for dizziness. 12/30/22   O'Sullivan, Melissa, NP  metoprolol  succinate (TOPROL -XL) 25 MG 24 hr tablet Take 0.5 tablets (12.5 mg total) by mouth every evening. 11/08/23   O'Sullivan, Melissa, NP  nitroGLYCERIN  (NITROSTAT ) 0.4 MG SL tablet Place 1 tablet (0.4 mg total) under the tongue every 5 (five) minutes as needed for chest pain. 12/14/23   O'Sullivan, Melissa, NP  nystatin  (MYCOSTATIN Franne ) powder Apply 1 Application topically to the affected area(s) 2 (two) times daily. Patient taking differently: Apply 1 Application topically 2 (two) times daily as needed (for dryness). 11/14/22   O'Sullivan, Melissa, NP  ondansetron  (ZOFRAN ) 4 MG tablet Take 1 tablet (4 mg total) by mouth every 8 (eight) hours as needed for up to 4 days. 12/15/23 12/20/23  Kammerer, Megan L, DO  polyethylene glycol powder (GLYCOLAX /MIRALAX ) 17 GM/SCOOP powder Take 17 g by mouth daily.    [provider]  potassium chloride  SA (KLOR-CON  M) 20 MEQ tablet Take 1 tablet (20 mEq total) by mouth daily at 12 noon. 12/06/23   O'Sullivan, Melissa, NP  Probiotic Product (PROBIOTIC DAILY) CAPS Take 1 tablet by mouth daily. 11/30/22   Almarie Waddell NOVAK, NP  Respiratory Therapy Supplies (NEBULIZER/TUBING/MOUTHPIECE) KIT Use as directed 08/26/21   Daryl Setter, NP  senna (SENOKOT) 8.6 MG TABS tablet Take 1 tablet (8.6 mg total) by mouth at  bedtime. Patient taking differently: Take 1 tablet by mouth daily as needed for mild constipation. 07/19/23   O'Sullivan, Melissa, NP  sucralfate  (CARAFATE ) 1 g tablet Take 1 tablet (1 g total) by mouth 4 (four) times daily -  with meals and at bedtime. Take one hour prior to morning medications. 12/06/23   O'Sullivan, Melissa, NP  valACYclovir  (VALTREX ) 500 MG tablet Take 1 tablet by mouth once every other day 12/06/23   O'Sullivan, Melissa, NP    Allergies: Ibuprofen    Review of Systems  Constitutional:  Negative for chills, diaphoresis, fatigue and fever.  HENT:  Negative for congestion, rhinorrhea and sneezing.   Eyes: Negative.   Respiratory:  Negative for cough  and shortness of breath.   Cardiovascular:  Positive for chest pain. Negative for leg swelling.  Gastrointestinal:  Positive for abdominal pain, nausea and vomiting. Negative for blood in stool and diarrhea.  Genitourinary:  Negative for difficulty urinating, flank pain and frequency.  Musculoskeletal:  Negative for arthralgias and back pain.  Skin:  Negative for rash.  Neurological:  Negative for dizziness, speech difficulty, weakness, numbness and headaches.    Updated Vital Signs BP (!) 102/90 (BP Location: Left Arm)   Pulse 66   Temp 98.6 F (37 C) (Axillary)   Resp 17   Ht 5' 5 (1.651 m)   Wt 86.2 kg   SpO2 99%   BMI 31.62 kg/m   Physical Exam Constitutional:      Appearance: She is well-developed.  HENT:     Head: Normocephalic and atraumatic.  Eyes:     Pupils: Pupils are equal, round, and reactive to light.  Cardiovascular:     Rate and Rhythm: Normal rate and regular rhythm.     Heart sounds: Normal heart sounds.  Pulmonary:     Effort: Pulmonary effort is normal. No respiratory distress.     Breath sounds: Normal breath sounds. No wheezing or rales.  Chest:     Chest wall: No tenderness.  Abdominal:     General: Bowel sounds are normal.     Palpations: Abdomen is soft.     Tenderness: There is no  abdominal tenderness. There is no guarding or rebound.  Musculoskeletal:        General: Normal range of motion.     Cervical back: Normal range of motion and neck supple.     Comments: No pain to the back or spine  Lymphadenopathy:     Cervical: No cervical adenopathy.  Skin:    General: Skin is warm and dry.     Findings: No rash.  Neurological:     Mental Status: She is alert and oriented to person, place, and time.     (all labs ordered are listed, but only abnormal results are displayed) Labs Reviewed  BASIC METABOLIC PANEL WITH GFR - Abnormal; Notable for the following components:      Result Value   Potassium 3.4 (*)    CO2 20 (*)    Glucose, Bld 119 (*)    Creatinine, Ser 1.56 (*)    GFR, Estimated 33 (*)    All other components within normal limits  CBC - Abnormal; Notable for the following components:   WBC 15.0 (*)    RDW 15.9 (*)    All other components within normal limits  HEPATIC FUNCTION PANEL - Abnormal; Notable for the following components:   Albumin 3.4 (*)    All other components within normal limits  LIPASE, BLOOD  URINALYSIS, ROUTINE W REFLEX MICROSCOPIC  TROPONIN I (HIGH SENSITIVITY)  TROPONIN I (HIGH SENSITIVITY)    EKG: None  Radiology: CT ABDOMEN PELVIS WO CONTRAST Result Date: 12/20/2023 CLINICAL DATA:  LLQ abdominal pain EXAM: CT ABDOMEN AND PELVIS WITHOUT CONTRAST TECHNIQUE: Multidetector CT imaging of the abdomen and pelvis was performed following the standard protocol without IV contrast. RADIATION DOSE REDUCTION: This exam was performed according to the departmental dose-optimization program which includes automated exposure control, adjustment of the mA and/or kV according to patient size and/or use of iterative reconstruction technique. COMPARISON:  12/15/2023. FINDINGS: Lower chest: No acute abnormality. Hepatobiliary: No focal liver abnormality is seen within the limits of an unenhanced exam. No gallstones, gallbladder wall thickening,  or  biliary dilatation. Pancreas: Unremarkable. Spleen: Unremarkable. Adrenals/Urinary Tract: Adrenal glands are unremarkable. No renal or ureteral calculi. No hydronephrosis. Bladder is unremarkable. Stomach/Bowel: Stomach and small bowel are grossly unremarkable. No obstruction. Enteric contrast is noted throughout the colon extending to the level of the rectum. Colonic diverticulosis, most pronounced at the level of the sigmoid colon. Compared to the prior exam there is suggestion of new mild short segment pericolonic haziness and wall thickening of the proximal to mid sigmoid colon, suspicious for acute diverticulitis. No perforation or abscess. Vascular/Lymphatic: Aortic atherosclerosis. No enlarged abdominal or pelvic lymph nodes. Reproductive: Status post hysterectomy. No adnexal masses. Other: No abdominopelvic ascites. No intraperitoneal free air. No abdominal wall hernia. Musculoskeletal: No acute osseous abnormality. No suspicious osseous lesion. Multilevel degenerative changes of the thoracolumbar spine. IMPRESSION: 1. Findings suspicious for mild acute sigmoid colonic diverticulitis. No evidence of perforation or abscess. 2.  Aortic Atherosclerosis (ICD10-I70.0). Electronically Signed   By: Harrietta Sherry M.D.   On: 12/20/2023 14:50   DG Chest 2 View Result Date: 12/20/2023 CLINICAL DATA:  Chest pain EXAM: CHEST - 2 VIEW COMPARISON:  December 15, 2023 FINDINGS: Streaky bibasilar opacities, favored to represent atelectasis and or scarring. No pleural effusions. No pneumothorax. Unchanged cardiomediastinal silhouette. No acute osseous findings. IMPRESSION: Likely mild bibasilar atelectasis/scarring. Electronically Signed   By: Michaeline Blanch M.D.   On: 12/20/2023 11:38     Procedures   Medications Ordered in the ED  oxyCODONE  (Oxy IR/ROXICODONE ) immediate release tablet 5 mg (5 mg Oral Given 12/20/23 1214)  ondansetron  (ZOFRAN -ODT) disintegrating tablet 4 mg (4 mg Oral Given 12/20/23 1215)   pantoprazole  (PROTONIX ) injection 40 mg (40 mg Intravenous Given 12/20/23 1357)  sodium chloride  0.9 % bolus 500 mL (500 mLs Intravenous New Bag/Given 12/20/23 1525)                                    Medical Decision Making Amount and/or Complexity of Data Reviewed Labs: ordered. Radiology: ordered.  Risk Prescription drug management.   This patient presents to the ED for concern of abdominal pain, this involves an extensive number of treatment options, and is a complaint that carries with it a high risk of complications and morbidity.  I considered the following differential and admission for this acute, potentially life threatening condition.  The differential diagnosis includes diverticulitis, kidney stone, pyelonephritis, renal abscess, bowel obstruction, ACS  MDM:    Patient is a 83 year old who presents with abdominal pain.  She describes it as pain in her lower abdomen that radiates up to her abdomen and she has had some more intense pain in her left side.  Currently she does not have any abdominal tenderness on exam.  She had 1 episode of vomiting yesterday but none today.  She was recently being treated for a UTI.  Cultures were sensitive to the antibiotic that she was given.  She does not have a fever.  Her white count however is higher than it was previously.  Given this, we will recheck a CT. CT scan shows evidence of acute diverticulitis.  No suggestions of perforation or abscess.  Her abdominal exam is benign.  Will start her on Augmentin .  She is overall well-appearing.  Her creatinine is mildly increased from her prior labs.  Advised her to have close follow-up with her PCP to recheck her abdominal pain and also to keep an eye on her creatinine.  She was given IV fluids.  She was discharged home in good condition.  Return precautions were given.  Her EKG does not show any ischemic changes.  She has had 2 negative troponins.  Her symptoms seem to be more localized to her abdomen  versus chest pain that would be more concerning for ACS.  (Labs, imaging, consults)  Labs: I Ordered, and personally interpreted labs.  The pertinent results include: Elevated WBC count, elevated creatinine  Imaging Studies ordered: I ordered imaging studies including chest x-ray, CT abdomen pelvis I independently visualized and interpreted imaging. I agree with the radiologist interpretation  Additional history obtained from daughter.  External records from outside source obtained and reviewed including history  Cardiac Monitoring: The patient was maintained on a cardiac monitor.  If on the cardiac monitor, I personally viewed and interpreted the cardiac monitored which showed an underlying rhythm of: Sinus rhythm  Reevaluation: After the interventions noted above, I reevaluated the patient and found that they have :improved  Social Determinants of Health:    Disposition: Discharged to home  Co morbidities that complicate the patient evaluation  Past Medical History:  Diagnosis Date   Allergic rhinitis    Allergy    seasonal allergies   Anxiety    CKD (chronic kidney disease) 04/04/2017   COPD (chronic obstructive pulmonary disease) (HCC)    uses inhaler   Depression    Fatty liver    GERD (gastroesophageal reflux disease)    Gout    hx of   Hyperlipidemia    on meds   Hypertension    on meds   Myocardial infarction [facility])    Peptic ulcer 01/04/2003   Vertigo      Medicines Meds ordered this encounter  Medications   oxyCODONE  (Oxy IR/ROXICODONE ) immediate release tablet 5 mg    Refill:  0   ondansetron  (ZOFRAN -ODT) disintegrating tablet 4 mg   pantoprazole  (PROTONIX ) injection 40 mg   sodium chloride  0.9 % bolus 500 mL   amoxicillin -clavulanate (AUGMENTIN ) 875-125 MG tablet    Sig: Take 1 tablet by mouth every 12 (twelve) hours.    Dispense:  14 tablet    Refill:  0    I have reviewed the patients home medicines and have made adjustments as  needed  Problem List / ED Course: Problem List Items Addressed This Visit   None Visit Diagnoses       Diverticulitis    -  Primary                Final diagnoses:  Diverticulitis    ED Discharge Orders          Ordered    amoxicillin -clavulanate (AUGMENTIN ) 875-125 MG tablet  Every 12 hours        12/20/23 1532               Lenor Hollering, MD 12/20/23 1536

## 2023-12-20 NOTE — ED Notes (Signed)
 Pt aware urine sample is needed when she is able to urinate.

## 2023-12-20 NOTE — Telephone Encounter (Signed)
FYI. Pt going to ED.  

## 2023-12-20 NOTE — ED Notes (Signed)
 Pt transported to CT ?

## 2023-12-22 ENCOUNTER — Telehealth: Payer: Self-pay | Admitting: Pharmacist

## 2023-12-22 ENCOUNTER — Other Ambulatory Visit (HOSPITAL_BASED_OUTPATIENT_CLINIC_OR_DEPARTMENT_OTHER): Payer: Self-pay

## 2023-12-22 NOTE — Telephone Encounter (Signed)
 Patient called to say that she has not received heraddherence packs yet. Her refill history shows that her maintenance medications were filled between 12/06/23 and 12/09/2023.  Per [employer]. CPT patient requested that her adherence packs be sent to [facility] and she would pick up there. Patient does not recall asking for this.  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 Confirmed that patient's adherence packs are there. Patient will go to pick up today. Adherence packs contain the following meds: allopurinol , atorvastatin , dicyclomine , escitalopram , ezetimibe , famotidine , isosorbide , levothyroxine , lisinopril , potassium and sucralfate  - pharmacist read list of packed meds to me.   Patient confirmed she had received other meds shipped to her in September - valacyclovir , nitroglycerine, furosemide , diazepam , amoxicillin +CA.   Plan to follow up with patient in 2 weeks to try to assist with October adherence refills.

## 2023-12-23 ENCOUNTER — Telehealth: Payer: Self-pay | Admitting: Pharmacist

## 2023-12-23 ENCOUNTER — Ambulatory Visit: Payer: Self-pay

## 2023-12-23 ENCOUNTER — Other Ambulatory Visit (HOSPITAL_COMMUNITY): Payer: Self-pay

## 2023-12-23 ENCOUNTER — Other Ambulatory Visit: Payer: Self-pay | Admitting: Family

## 2023-12-23 DIAGNOSIS — R1013 Epigastric pain: Secondary | ICD-10-CM

## 2023-12-23 NOTE — Telephone Encounter (Signed)
 Patient left voicemail for Clinical Pharmacist Practitioner x 2. She states that she is still having abdominal pain. She is taking medication given in the ED earlier this week but it is not helping.  She was given amoxicillin +clavulanate.  Called patient back. She does not have any transportation. She initially said she was alonie at home but later toler me her daughter was with her but she is not able to drive.  She is wondering if she should call EMS.   Forwarding to her PCP. I recommended she call the main office number [phone], they can get her connected to a triage nurse that can help her determine what to do next. Or she can call EMS to come evaluate her.

## 2023-12-23 NOTE — Telephone Encounter (Signed)
 FYI Only or Action Required?: FYI only for provider.  Patient was last seen in primary care on 12/15/2023 by Daryl Setter, NP.  Called Nurse Triage reporting Abdominal Pain.  Symptoms began a couple of weeks ago.  Interventions attempted: OTC medications: Ibuprofen  and Prescription medications: Augmentin .  Symptoms are: unchanged.  Triage Disposition: See Physician Within 24 Hours (PCP wanted appointment scheduled next week which I have done)  Patient/caregiver understands and will follow disposition?: Yes with modifications, see above       Copied from CRM [id number]. Topic: Clinical - Medical Advice >> Dec 23, 2023  1:47 PM Berneda FALCON wrote: Reason for CRM: Patient called in this morning and was called back by NP Setter Daryl and instructed by CMA to callback this afternoon to try her again. Called the CAL and she stated that her CMA is gone and NP is gone as well. Suggested we call our NT team to go over the medical instruction with the patient.        Reason for Disposition  [1] MILD pain (e.g., does not interfere with normal activities) AND [2] comes and goes (cramps) AND [3] present > 72 hours  (Exception: This same abdominal pain is a chronic symptom recurrent or ongoing AND present > 4 weeks.)    Earliest appointment with PCP scheduled and added to wait list  Answer Assessment - Initial Assessment Questions Patient advised of the instructions Setter has in her notes. Appointment scheduled next week per those instructions and patient advised to go to the ED if her symptoms worsen. Patient verbalized understanding and agreement with this plan.      1. LOCATION: Where does it hurt?      Upper abdomen  2. RADIATION: Does the pain shoot anywhere else? (e.g., chest, back)     No radiation  3. ONSET: When did the pain begin? (e.g., minutes, hours or days ago)      A couple of weeks ago  5. PATTERN Does the pain come and go, or is it constant?      Intermittent  6. SEVERITY: How bad is the pain?  (e.g., Scale 1-10; mild, moderate, or severe)     Moderate  7. RECURRENT SYMPTOM: Have you ever had this type of stomach pain before? If Yes, ask: When was the last time? and What happened that time?      Yes 8. AGGRAVATING FACTORS: Does anything seem to cause this pain? (e.g., foods, stress, alcohol)     Foods 9. CARDIAC SYMPTOMS: Do you have any of the following symptoms: chest pain, difficulty breathing, sweating, nausea?     Chest pain  10. OTHER SYMPTOMS: Do you have any other symptoms? (e.g., back pain, diarrhea, fever, urination pain, vomiting)       Some headaches  Protocols used: Abdominal Pain - Upper-A-AH

## 2023-12-23 NOTE — Telephone Encounter (Signed)
 I tried to call pt- no answer, mailbox full.   Please try her again later this afternoon. She should continue augmentin  for diverticulitis.  Tylenol  as needed for pain. If pain is worsening she should call 911 to take her back to the ED.  If pain is not worsening, then I would recommend that she schedule an appointment with me early next week for follow up.

## 2023-12-23 NOTE — Telephone Encounter (Signed)
 Patient called Clinical Pharmacist number instead of clinic. She left a message that she was returning a call.  I was not able to reach patient but I was able to leave a message. Provided instructions from [facility].   She should continue augmentin  for diverticulitis. Tylenol  as needed for pain. If pain is worsening she should call 911 to take her back to the ED. If pain is not worsening, then I would recommend that she schedule an appointment with me early next week for follow up.   Provided office number for to call to make followup appointment - [phone]

## 2023-12-24 MED ORDER — SUCRALFATE 1 G PO TABS
1.0000 g | ORAL_TABLET | Freq: Three times a day (TID) | ORAL | 0 refills | Status: DC
  Filled 2023-12-24 – 2024-01-04 (×3): qty 120, 30d supply, fill #0

## 2023-12-24 MED ORDER — ONDANSETRON HCL 4 MG PO TABS
4.0000 mg | ORAL_TABLET | Freq: Three times a day (TID) | ORAL | 0 refills | Status: AC | PRN
  Filled 2023-12-24: qty 12, 4d supply, fill #0

## 2023-12-26 ENCOUNTER — Other Ambulatory Visit (HOSPITAL_COMMUNITY): Payer: Self-pay

## 2023-12-26 ENCOUNTER — Other Ambulatory Visit: Payer: Self-pay

## 2023-12-27 ENCOUNTER — Other Ambulatory Visit (HOSPITAL_COMMUNITY): Payer: Self-pay

## 2023-12-28 ENCOUNTER — Other Ambulatory Visit: Payer: Self-pay

## 2023-12-28 ENCOUNTER — Other Ambulatory Visit (HOSPITAL_COMMUNITY): Payer: Self-pay

## 2023-12-28 ENCOUNTER — Ambulatory Visit: Admitting: Family

## 2023-12-28 ENCOUNTER — Other Ambulatory Visit (HOSPITAL_BASED_OUTPATIENT_CLINIC_OR_DEPARTMENT_OTHER): Payer: Self-pay

## 2023-12-28 VITALS — BP 93/62 | HR 82 | Temp 98.7°F | Resp 16 | Ht 65.0 in | Wt 194.0 lb

## 2023-12-28 DIAGNOSIS — G8929 Other chronic pain: Secondary | ICD-10-CM

## 2023-12-28 DIAGNOSIS — I2510 Atherosclerotic heart disease of native coronary artery without angina pectoris: Secondary | ICD-10-CM | POA: Diagnosis not present

## 2023-12-28 DIAGNOSIS — R350 Frequency of micturition: Secondary | ICD-10-CM

## 2023-12-28 DIAGNOSIS — R1010 Upper abdominal pain, unspecified: Secondary | ICD-10-CM

## 2023-12-28 DIAGNOSIS — J45901 Unspecified asthma with (acute) exacerbation: Secondary | ICD-10-CM

## 2023-12-28 DIAGNOSIS — Z23 Encounter for immunization: Secondary | ICD-10-CM

## 2023-12-28 DIAGNOSIS — K5792 Diverticulitis of intestine, part unspecified, without perforation or abscess without bleeding: Secondary | ICD-10-CM

## 2023-12-28 LAB — CBC WITH DIFFERENTIAL/PLATELET
Basophils Absolute: 0.1 K/uL (ref 0.0–0.1)
Basophils Relative: 0.7 % (ref 0.0–3.0)
Eosinophils Absolute: 0.2 K/uL (ref 0.0–0.7)
Eosinophils Relative: 2.2 % (ref 0.0–5.0)
HCT: 37 % (ref 36.0–46.0)
Hemoglobin: 11.8 g/dL — ABNORMAL LOW (ref 12.0–15.0)
Lymphocytes Relative: 23.8 % (ref 12.0–46.0)
Lymphs Abs: 2.3 K/uL (ref 0.7–4.0)
MCHC: 31.9 g/dL (ref 30.0–36.0)
MCV: 87 fl (ref 78.0–100.0)
Monocytes Absolute: 0.8 K/uL (ref 0.1–1.0)
Monocytes Relative: 8.4 % (ref 3.0–12.0)
Neutro Abs: 6.2 K/uL (ref 1.4–7.7)
Neutrophils Relative %: 64.9 % (ref 43.0–77.0)
Platelets: 266 K/uL (ref 150.0–400.0)
RBC: 4.25 Mil/uL (ref 3.87–5.11)
RDW: 15.5 % (ref 11.5–15.5)
WBC: 9.5 K/uL (ref 4.0–10.5)

## 2023-12-28 MED ORDER — PREDNISONE 10 MG PO TABS
ORAL_TABLET | ORAL | 0 refills | Status: AC
  Filled 2023-12-28: qty 20, 8d supply, fill #0

## 2023-12-28 MED ORDER — ALBUTEROL SULFATE (2.5 MG/3ML) 0.083% IN NEBU
INHALATION_SOLUTION | RESPIRATORY_TRACT | 0 refills | Status: AC
  Filled 2023-12-28: qty 150, 12d supply, fill #0

## 2023-12-28 MED ORDER — AMOXICILLIN-POT CLAVULANATE 875-125 MG PO TABS
1.0000 | ORAL_TABLET | Freq: Two times a day (BID) | ORAL | 0 refills | Status: DC
  Filled 2023-12-28: qty 14, 7d supply, fill #0

## 2023-12-28 MED ORDER — ALBUTEROL SULFATE HFA 108 (90 BASE) MCG/ACT IN AERS
2.0000 | INHALATION_SPRAY | Freq: Four times a day (QID) | RESPIRATORY_TRACT | 2 refills | Status: AC | PRN
  Filled 2023-12-28: qty 6.7, 25d supply, fill #0

## 2023-12-28 NOTE — Assessment & Plan Note (Signed)
 Poor historian, but it sounds like she is still having some left sided abdominal discomfort.  Will extend augmentin  for 3 more days and check CBC.
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
## 2023-12-28 NOTE — Assessment & Plan Note (Signed)
 Pt advised to keep follow up appointment with cardiology in October.

## 2023-12-28 NOTE — Assessment & Plan Note (Signed)
 Suspect multifactorial- constipation, gerd hiatal hernia.  Continues carafate , bentyl , pepcid .  Recommend that she schedule follow up with GI.

## 2023-12-28 NOTE — Assessment & Plan Note (Signed)
 Wheezing today- will rx with prednisone  taper, albuterol  nebs/MDI.

## 2023-12-28 NOTE — Patient Instructions (Addendum)
 Please call [facility] to schedule a follow up appointment.  [phone] Keep upcoming appointment with Dr. Sheena. Start prednisone  taper for asthma, you may use albuterol  (inhaler or machine with nebs) every 4-6 hours as needed. Complete lab work prior to leaving. Continue augmentin  for 3 more days.

## 2023-12-29 ENCOUNTER — Ambulatory Visit: Payer: Self-pay | Admitting: Family

## 2023-12-29 ENCOUNTER — Other Ambulatory Visit (HOSPITAL_BASED_OUTPATIENT_CLINIC_OR_DEPARTMENT_OTHER): Payer: Self-pay

## 2023-12-29 ENCOUNTER — Other Ambulatory Visit (HOSPITAL_COMMUNITY): Payer: Self-pay

## 2023-12-29 DIAGNOSIS — N3000 Acute cystitis without hematuria: Secondary | ICD-10-CM

## 2023-12-29 DIAGNOSIS — D649 Anemia, unspecified: Secondary | ICD-10-CM

## 2023-12-29 NOTE — Telephone Encounter (Signed)
 She is mildly anemic. I would like her to complete ifob please.

## 2023-12-30 ENCOUNTER — Other Ambulatory Visit (HOSPITAL_COMMUNITY): Payer: Self-pay

## 2024-01-01 LAB — URINE CULTURE
MICRO NUMBER:: 17011269
SPECIMEN QUALITY:: ADEQUATE

## 2024-01-02 ENCOUNTER — Other Ambulatory Visit (HOSPITAL_COMMUNITY): Payer: Self-pay

## 2024-01-02 ENCOUNTER — Other Ambulatory Visit: Payer: Self-pay | Admitting: Family

## 2024-01-02 ENCOUNTER — Other Ambulatory Visit: Payer: Self-pay

## 2024-01-02 MED ORDER — CIPROFLOXACIN HCL 250 MG PO TABS
250.0000 mg | ORAL_TABLET | Freq: Two times a day (BID) | ORAL | 0 refills | Status: AC
  Filled 2024-01-02 (×2): qty 6, 3d supply, fill #0

## 2024-01-02 NOTE — Telephone Encounter (Signed)
 Urine culture shows UTI. I would like her to start cipro . Please send to pharmacy of her choice.

## 2024-01-02 NOTE — Progress Notes (Signed)
Patient notified of results and rx 

## 2024-01-02 NOTE — Progress Notes (Signed)
 Information also given to daughter Kate

## 2024-01-02 NOTE — Telephone Encounter (Signed)
 Requesting: diazepam  10mg   Contract:07/26/22 UDS: 07/26/22 Last Visit: 12/28/23  Next Visit: None Last Refill: 12/06/23 #60 and 0RF   Please Advise

## 2024-01-02 NOTE — Progress Notes (Signed)
 Patient's daughter notified patient needs ifob, she will pick up kit this week

## 2024-01-03 ENCOUNTER — Other Ambulatory Visit: Payer: Self-pay

## 2024-01-03 ENCOUNTER — Other Ambulatory Visit (HOSPITAL_COMMUNITY): Payer: Self-pay

## 2024-01-03 MED ORDER — DIAZEPAM 10 MG PO TABS
10.0000 mg | ORAL_TABLET | Freq: Two times a day (BID) | ORAL | 0 refills | Status: DC | PRN
  Filled 2024-01-03 – 2024-01-05 (×3): qty 60, 30d supply, fill #0

## 2024-01-04 ENCOUNTER — Other Ambulatory Visit: Payer: Self-pay | Admitting: Family

## 2024-01-04 ENCOUNTER — Other Ambulatory Visit (HOSPITAL_COMMUNITY): Payer: Self-pay

## 2024-01-04 ENCOUNTER — Ambulatory Visit: Admitting: Pharmacist

## 2024-01-04 ENCOUNTER — Other Ambulatory Visit: Payer: Self-pay

## 2024-01-04 DIAGNOSIS — Z79899 Other long term (current) drug therapy: Secondary | ICD-10-CM

## 2024-01-04 MED ORDER — FAMOTIDINE 20 MG PO TABS
20.0000 mg | ORAL_TABLET | Freq: Two times a day (BID) | ORAL | 0 refills | Status: DC
  Filled 2024-01-04: qty 60, 30d supply, fill #0
  Filled 2024-01-31: qty 60, 30d supply, fill #1
  Filled 2024-02-24 – 2024-02-28 (×2): qty 60, 30d supply, fill #2

## 2024-01-04 MED ORDER — NITROGLYCERIN 0.4 MG SL SUBL
0.4000 mg | SUBLINGUAL_TABLET | SUBLINGUAL | 3 refills | Status: AC | PRN
  Filled 2024-01-04: qty 25, 5d supply, fill #0

## 2024-01-04 MED ORDER — ONDANSETRON HCL 4 MG PO TABS
4.0000 mg | ORAL_TABLET | Freq: Three times a day (TID) | ORAL | 1 refills | Status: DC | PRN
  Filled 2024-01-04: qty 30, 10d supply, fill #0

## 2024-01-04 NOTE — Progress Notes (Signed)
 01/04/2024 Name: Veronica Wolf MRN: [id number] DOB: November 04, 1940  Chief Complaint  Patient presents with   Medication Management    Veronica Wolf is a 83 y.o. year old female who presented for a telephone visit.   They were referred to the pharmacist by their PCP for assistance in managing complex medication management.    Subjective:  Care Team: Primary Care Provider: Daryl Setter, NP ; Next Scheduled Visit: no follow up currently scheduled Cardiologist: Dr Sheena; Next Scheduled Visit: 01/31/2024  Medication Access/Adherence  Current Pharmacy:  Veronica Wolf - [facility] [address] [place] [address] Phone: [phone] Fax: [phone]  [facility] - [address] ([place]), [place] - [address] [place] ([place]) [place] [address] Phone: [phone] Fax: [phone]  [facility] [address] [place], [place] - [address] [place] [address] Phone: [phone] Fax: [phone]  [facility] [address] [place] [address] Phone: [phone] Fax: [phone]   Patient reports affordability concerns with their medications: Yes  Patient reports access/transportation concerns to their pharmacy: Yes  - daughter and son in law help some with transportation. Patient is currently living with her daughter.  Patient reports adherence concerns with their medications:  Yes     12/28/2023 seen by PCP for follow up  Noted to have UTI - prescribed ciprofloxacin  250mg  twice a day for 3 days.  Cough and shortness of breath / asthma exacerbation - prescribed prednisone  10mg  x 8 day taper Keep appt with cardiology for October Diverticulitis / abdominal pain - extended Augmentin  for 3 more days. Recommended f/u with GI  Today patient reports she spilled her medication bottles and is confused as to what she should be  taking. She usually uses adherence packs but she has been prescribed some antibiotics, prednisone  and acute medications that were not included in her adherence packs.  It is difficult to get exact details from patient.  She has loose tablets and provides me with information to try to identify.  1) - large white tablet with 32 on it - she has #3 left (identified it was amoxicillin /clavulanic acid or generic Augmentin ) 2) tiny white round tablet with 059 on it - she has #4 left (identified it as prednisone  10mg  tablets) 3) small white round tablet with Y101 on it - she has #1 left (denitified it was ciprofloxacin  250mg  tablets)   Patient is also requesting refills for ondansetron  and the tablet I put under my tongue / nitroglycerin .  Ondansetron  4mg  tabs - filled 9/12 and 9/22 for #12 tablets (= 4 days supply) Nitroglycerin  filled 9/3, 9/4, 9/10, 9/12 and 9/22 # 25 tablets.     Objective:  Lab Results  Component Value Date   HGBA1C 6.5 10/20/2023    Lab Results  Component Value Date   CREATININE 1.56 (H) 12/20/2023   BUN 16 12/20/2023   NA 140 12/20/2023   K 3.4 (L) 12/20/2023   CL 109 12/20/2023   CO2 20 (L) 12/20/2023    Lab Results  Component Value Date   CHOL 242 (H) 04/22/2023   HDL 45.50 04/22/2023   LDLCALC 153 (H) 04/22/2023   TRIG 217.0 (H) 04/22/2023   CHOLHDL 5 04/22/2023    Medications Reviewed Today     Reviewed by Veronica Wolf, RPH-CPP (Pharmacist) on 01/04/24 at 1221  Med List Status: <None>   Medication Order Taking? Sig Documenting  Provider Last Dose Status Informant  albuterol  (PROVENTIL ) (2.5 MG/3ML) 0.083% nebulizer solution [id number]  USE 1 VIAL IN NEBULIZER EVERY 6 HOURS AS NEEDED FOR WHEEZING AND FOR SHORTNESS OF Veronica Wolf, Eleanor, NP  Active   albuterol  (VENTOLIN  HFA) 108 (90 Base) MCG/ACT inhaler [id number]  Inhale 2 puffs into the lungs every 6 (six) hours as needed for wheezing or shortness of breath. Wolf Eleanor, NP   Active   allopurinol  (ZYLOPRIM ) 100 MG tablet [id number]  Take 2 tablets (200 mg total) by mouth daily. Wolf Eleanor, NP  Active Child  amoxicillin -clavulanate (AUGMENTIN ) 875-125 MG tablet [id number]  Take 1 tablet by mouth every 12 (twelve) hours. Wolf Eleanor, NP  Active   aspirin  81 MG EC tablet [id number]  Take 1 tablet (81 mg total) by mouth daily. Swallow whole. Tobb, Kardie, DO  Active Child           Med Note Veronica Wolf, Veronica Wolf   Wed Oct 07, 2021 11:19 AM)    atorvastatin  (LIPITOR ) 80 MG tablet [id number]  Take 1 tablet (80 mg total) by mouth daily at 6 PM Wolf Eleanor, NP  Active Child  Blood Glucose Monitoring Suppl (ACCU-CHEK GUIDE ME) w/Device KIT [id number]  Use to test blood sugar in the morning, at noon, and at bedtime. Wolf Eleanor, NP  Active Child  ciprofloxacin  (CIPRO ) 250 MG tablet [id number]  Take 1 tablet (250 mg total) by mouth 2 (two) times daily for 3 days. O'Sullivan, Melissa, NP  Active   clobetasol  ointment (TEMOVATE ) 0.05 % [id number]  Apply 1 Application topically 2 (two) times daily. For up to 2 weeks.  Patient taking differently: Apply 1 Application topically 2 (two) times daily as needed (for rash).   Wolf Eleanor, NP  Active Child  colchicine  0.6 MG tablet [id number]  Take 0.6 mg by mouth daily as needed (gout). [provider]  Active Child  diazepam  (VALIUM ) 10 MG tablet [id number]  Take 1 tablet (10 mg total) by mouth every 12 (twelve) hours as needed for anxiety. O'Sullivan, Melissa, NP  Active   dicyclomine  (BENTYL ) 10 MG capsule [id number]  Take 1 capsule (10 mg total) by mouth 2 (two) times daily. Wolf Eleanor, NP  Active Child  escitalopram  (LEXAPRO ) 20 MG tablet [id number]  Take 1 tablet (20 mg total) by mouth daily with lunch Wolf Eleanor, NP  Active Child  ezetimibe  (ZETIA ) 10 MG tablet [id number]  Take 1 tablet (10 mg total) by mouth daily with lunch. Wolf Eleanor, NP  Active Child  famotidine  (PEPCID )  20 MG tablet [id number]  Take 1 tablet (20 mg total) by mouth in the morning and at bedtime. O'Sullivan, Melissa, NP  Active   furosemide  (LASIX ) 40 MG tablet [id number]  Take 0.5 tablets (20 mg total) by mouth daily as needed. Take only for swelling or weight gain (2 lbs in a day or 5 lbs in a week).  Call your PCP for follow up if you are using this frequently. Wolf Eleanor, NP  Active Child  ibuprofen (ADVIL) 200 MG tablet [id number]  Take 200 mg by mouth every 6 (six) hours as needed for mild pain (pain score 1-3). [provider]  Active Child  isosorbide  mononitrate (IMDUR ) 60 MG 24 hr tablet [id number]  Take 1 tablet (60 mg total) by mouth daily at 6 PM. Wolf Eleanor, NP  Active Child  levothyroxine  (SYNTHROID ) 25 MCG tablet [id number]  Take 1 tablet (25 mcg total) by mouth before breakfast. Domenica Harlene LABOR, MD  Active Child  lisinopril  (ZESTRIL ) 2.5 MG tablet [id number]  Take 1 tablet (2.5 mg total) by mouth daily with lunch. Daryl Setter, NP  Active Child  meclizine  (ANTIVERT ) 25 MG tablet [id number]  Take 1 tablet (25 mg total) by mouth 3 (three) times daily as needed for dizziness. Daryl Setter, NP  Active Child  metoprolol  succinate (TOPROL -XL) 25 MG 24 hr tablet [id number]  Take 0.5 tablets (12.5 mg total) by mouth every evening. Daryl Setter, NP  Active Child  nitroGLYCERIN  (NITROSTAT ) 0.4 MG SL tablet [id number]  Place 1 tablet (0.4 mg total) under the tongue every 5 (five) minutes as needed for chest pain. Daryl Setter, NP  Active Child  nystatin  (MYCOSTATIN /NYSTOP ) powder [id number]  Apply 1 Application topically to the affected area(s) 2 (two) times daily.  Patient taking differently: Apply 1 Application topically 2 (two) times daily as needed (for dryness).   Daryl Setter, NP  Active Child  ondansetron  (ZOFRAN ) 4 MG tablet [id number] Yes Take 4 mg by mouth every 8 (eight) hours as needed for nausea or vomiting. [provider]   Active   polyethylene glycol powder (GLYCOLAX /MIRALAX ) 17 GM/SCOOP powder [id number]  Take 17 g by mouth daily. [provider]  Active Child  potassium chloride  SA (KLOR-CON  M) 20 MEQ tablet [id number]  Take 1 tablet (20 mEq total) by mouth daily at 12 noon. Daryl Setter, NP  Active Child  predniSONE  (DELTASONE ) 10 MG tablet [id number]  Take 4 tablets (40 mg total) daily for 2 days, THEN 3 tablets (30 mg total) daily for 2 days, THEN 2 tablets (20 mg total) daily for 2 days, THEN 1 tablet (10 mg total) daily for 2 days. Daryl Setter, NP  Active   Probiotic Product (PROBIOTIC DAILY) CAPS [id number]  Take 1 tablet by mouth daily. Almarie Waddell NOVAK, NP  Active Child  Respiratory Therapy Supplies (NEBULIZER/TUBING/MOUTHPIECE) KIT [id number]  Use as directed Daryl Setter, NP  Active Child  senna (SENOKOT) 8.6 MG TABS tablet [id number]  Take 1 tablet (8.6 mg total) by mouth at bedtime.  Patient taking differently: Take 1 tablet by mouth daily as needed for mild constipation.   Daryl Setter, NP  Active Child  sucralfate  (CARAFATE ) 1 g tablet [id number]  Take 1 tablet (1 g total) by mouth 4 (four) times daily -  with meals and at bedtime. Take one hour prior to morning medications. Daryl Setter, NP  Active   valACYclovir  (VALTREX ) 500 MG tablet [id number]  Take 1 tablet by mouth once every other day Daryl Setter, NP  Active Child              Assessment/Plan:   Medication Management: - assisted patient in identifying tablets and provided instructions on how to complete courses of the following (also provided instructions to her daughter, Kate)  1) amoxicillin  + CA / generic Augmentin  - take 1 tablet  every 12 hours with a meal for diverticulitis. Has 3 tablets remaining so will complete course tomorrow morning.   2) prednisone  - has 4 tabs left - recommended she take 2 tablets today, 1 tablet tomorrow and 1 tablet on Friday. Instructed to take in the mornings  and with food.   3) ciprofloxacin  - has 1 tablet left - recommended she take 1 tablet today and will complete course.   - Provided updated instruction on nitroglycerin  - she should call 911 if she has to take 2 or more tablets for chest pain. She has been worked up during recent ED visits and no cardiac issues suspected  for her pain. Will ask PCP about refills.  - Will also ask PCP about refill on ondansetron .     Follow Up Plan: 2 weeks  Madelin Ray, PharmD Clinical Pharmacist [facility]

## 2024-01-04 NOTE — Addendum Note (Signed)
 Addended by: DARYL SETTER on: 01/04/2024 04:47 PM   Modules accepted: Orders

## 2024-01-05 ENCOUNTER — Other Ambulatory Visit: Payer: Self-pay

## 2024-01-05 ENCOUNTER — Other Ambulatory Visit (HOSPITAL_COMMUNITY): Payer: Self-pay

## 2024-01-06 ENCOUNTER — Other Ambulatory Visit (HOSPITAL_COMMUNITY): Payer: Self-pay

## 2024-01-06 ENCOUNTER — Other Ambulatory Visit: Payer: Self-pay

## 2024-01-12 ENCOUNTER — Other Ambulatory Visit: Payer: Self-pay | Admitting: Pharmacist

## 2024-01-12 ENCOUNTER — Telehealth: Payer: Self-pay | Admitting: Pharmacist

## 2024-01-12 NOTE — Telephone Encounter (Signed)
 Attempt was made to contact patient by phone today for follow up by Clinical Pharmacist regarding medication management.  Unable to reach patient. LM on VM with my contact number [phone].

## 2024-01-19 ENCOUNTER — Ambulatory Visit

## 2024-01-19 ENCOUNTER — Other Ambulatory Visit: Payer: Self-pay | Admitting: Family

## 2024-01-19 ENCOUNTER — Other Ambulatory Visit (HOSPITAL_COMMUNITY): Payer: Self-pay

## 2024-01-19 VITALS — Ht 65.0 in | Wt 194.0 lb

## 2024-01-19 DIAGNOSIS — Z0000 Encounter for general adult medical examination without abnormal findings: Secondary | ICD-10-CM | POA: Diagnosis not present

## 2024-01-19 NOTE — Patient Instructions (Addendum)
 Veronica Wolf,  Thank you for taking the time for your Medicare Wellness Visit. I appreciate your continued commitment to your health goals. Please review the care plan we discussed, and feel free to reach out if I can assist you further.  Medicare recommends these wellness visits once per year to help you and your care team stay ahead of potential health issues. These visits are designed to focus on prevention, allowing your provider to concentrate on managing your acute and chronic conditions during your regular appointments.  Please note that Annual Wellness Visits do not include a physical exam. Some assessments may be limited, especially if the visit was conducted virtually. If needed, we may recommend a separate in-person follow-up with your provider.  Ongoing Care Seeing your primary care provider every 3 to 6 months helps us  monitor your health and provide consistent, personalized care.    Referrals If a referral was made during today's visit and you haven't received any updates within two weeks, please contact the referred provider directly to check on the status.  Recommended Screenings:  Health Maintenance  Topic Date Due   Eye exam for diabetics  Never done   Zoster (Shingles) Vaccine (1 of 2) Never done   COVID-19 Vaccine (3 - Pfizer risk series) 07/06/2019   Hemoglobin A1C  04/21/2024   Complete foot exam   07/04/2024   Yearly kidney health urinalysis for diabetes  07/05/2024   Yearly kidney function blood test for diabetes  12/19/2024   Medicare Annual Wellness Visit  01/18/2025   DTaP/Tdap/Td vaccine (3 - Td or Tdap) 04/06/2027   Pneumococcal Vaccine for age over 27  Completed   Flu Shot  Completed   DEXA scan (bone density measurement)  Completed   Meningitis B Vaccine  Aged Out       01/19/2024   10:22 AM  Advanced Directives  Does Patient Have a Medical Advance Directive? Yes  Type of Estate agent of [place];Living will  Copy of  Healthcare Power of Attorney in Chart? No - copy requested   Advance Care Planning is important because it: Ensures you receive medical care that aligns with your values, goals, and preferences. Provides guidance to your family and loved ones, reducing the emotional burden of decision-making during critical moments.  Vision: Annual vision screenings are recommended for early detection of glaucoma, cataracts, and diabetic retinopathy. These exams can also reveal signs of chronic conditions such as diabetes and high blood pressure.  Dental: Annual dental screenings help detect early signs of oral cancer, gum disease, and other conditions linked to overall health, including heart disease and diabetes.  Please see the attached documents for additional preventive care recommendations.

## 2024-01-19 NOTE — Progress Notes (Signed)
 Subjective:   Veronica Wolf is a 83 y.o. who presents for a Medicare Wellness preventive visit.  As a reminder, Annual Wellness Visits don't include a physical exam, and some assessments may be limited, especially if this visit is performed virtually. We may recommend an in-person follow-up visit with your provider if needed.  Visit Complete: Virtual I connected with  Veronica Wolf on 01/19/24 by a audio enabled telemedicine application and verified that I am speaking with the correct person using two identifiers.  Patient Location: Home  Provider Location: Home Office  I discussed the limitations of evaluation and management by telemedicine. The patient expressed understanding and agreed to proceed.  Vital Signs: Because this visit was a virtual/telehealth visit, some criteria may be missing or patient reported. Any vitals not documented were not able to be obtained and vitals that have been documented are patient reported.    Persons Participating in Visit: Patient.  AWV Questionnaire: No: Patient Medicare AWV questionnaire was not completed prior to this visit.  Cardiac Risk Factors include: advanced age (>58men, >83 women);hypertension     Objective:    Today's Vitals   01/19/24 1013 01/19/24 1014  Weight: 194 lb (88 kg)   Height: 5' 5 (1.651 m)   PainSc:  0-No pain   Body mass index is 32.28 kg/m.     01/19/2024   10:22 AM 12/20/2023   10:52 AM 12/15/2023    4:37 PM 08/18/2023   10:41 AM 06/09/2022    3:02 PM 03/19/2022    4:14 PM 01/27/2022    8:41 AM  Advanced Directives  Does Patient Have a Medical Advance Directive? Yes No No No No No Yes  Type of Estate agent of [place];Living will      Living will  Does patient want to make changes to medical advance directive?       No - Patient declined  Copy of Healthcare Power of Attorney in Chart? No - copy requested        Would patient like information on creating a medical advance directive?    No - Patient declined  No - Patient declined      Current Medications (verified) Outpatient Encounter Medications as of 01/19/2024  Medication Sig   albuterol  (PROVENTIL ) (2.5 MG/3ML) 0.083% nebulizer solution USE 1 VIAL IN NEBULIZER EVERY 6 HOURS AS NEEDED FOR WHEEZING AND FOR SHORTNESS OF BREATH   albuterol  (VENTOLIN  HFA) 108 (90 Base) MCG/ACT inhaler Inhale 2 puffs into the lungs every 6 (six) hours as needed for wheezing or shortness of breath.   allopurinol  (ZYLOPRIM ) 100 MG tablet Take 2 tablets (200 mg total) by mouth daily.   amoxicillin -clavulanate (AUGMENTIN ) 875-125 MG tablet Take 1 tablet by mouth every 12 (twelve) hours.   aspirin  81 MG EC tablet Take 1 tablet (81 mg total) by mouth daily. Swallow whole.   atorvastatin  (LIPITOR ) 80 MG tablet Take 1 tablet (80 mg total) by mouth daily at 6 PM   Blood Glucose Monitoring Suppl (ACCU-CHEK GUIDE ME) w/Device KIT Use to test blood sugar in the morning, at noon, and at bedtime.   clobetasol  ointment (TEMOVATE ) 0.05 % Apply 1 Application topically 2 (two) times daily. For up to 2 weeks. (Patient taking differently: Apply 1 Application topically 2 (two) times daily as needed (for rash).)   colchicine  0.6 MG tablet Take 0.6 mg by mouth daily as needed (gout).   diazepam  (VALIUM ) 10 MG tablet Take 1 tablet (10 mg total) by mouth every  12 (twelve) hours as needed for anxiety.   dicyclomine  (BENTYL ) 10 MG capsule Take 1 capsule (10 mg total) by mouth 2 (two) times daily.   escitalopram  (LEXAPRO ) 20 MG tablet Take 1 tablet (20 mg total) by mouth daily with lunch   ezetimibe  (ZETIA ) 10 MG tablet Take 1 tablet (10 mg total) by mouth daily with lunch.   famotidine  (PEPCID ) 20 MG tablet Take 1 tablet (20 mg total) by mouth in the morning and at bedtime.   furosemide  (LASIX ) 40 MG tablet Take 0.5 tablets (20 mg total) by mouth daily as needed. Take only for swelling or weight gain (2 lbs in a day or 5 lbs in a week).  Call your PCP for follow up if you  are using this frequently.   ibuprofen (ADVIL) 200 MG tablet Take 200 mg by mouth every 6 (six) hours as needed for mild pain (pain score 1-3).   isosorbide  mononitrate (IMDUR ) 60 MG 24 hr tablet Take 1 tablet (60 mg total) by mouth daily at 6 PM.   levothyroxine  (SYNTHROID ) 25 MCG tablet Take 1 tablet (25 mcg total) by mouth before breakfast.   lisinopril  (ZESTRIL ) 2.5 MG tablet Take 1 tablet (2.5 mg total) by mouth daily with lunch.   meclizine  (ANTIVERT ) 25 MG tablet Take 1 tablet (25 mg total) by mouth 3 (three) times daily as needed for dizziness.   metoprolol  succinate (TOPROL -XL) 25 MG 24 hr tablet Take 0.5 tablets (12.5 mg total) by mouth every evening.   nitroGLYCERIN  (NITROSTAT ) 0.4 MG SL tablet Place 1 tablet (0.4 mg total) under the tongue every 5 (five) minutes as needed for chest pain.   nystatin  (MYCOSTATIN /NYSTOP ) powder Apply 1 Application topically to the affected area(s) 2 (two) times daily. (Patient taking differently: Apply 1 Application topically 2 (two) times daily as needed (for dryness).)   ondansetron  (ZOFRAN ) 4 MG tablet Take 1 tablet (4 mg total) by mouth every 8 (eight) hours as needed for nausea or vomiting.   polyethylene glycol powder (GLYCOLAX /MIRALAX ) 17 GM/SCOOP powder Take 17 g by mouth daily.   potassium chloride  SA (KLOR-CON  M) 20 MEQ tablet Take 1 tablet (20 mEq total) by mouth daily at 12 noon.   Probiotic Product (PROBIOTIC DAILY) CAPS Take 1 tablet by mouth daily.   Respiratory Therapy Supplies (NEBULIZER/TUBING/MOUTHPIECE) KIT Use as directed   senna (SENOKOT) 8.6 MG TABS tablet Take 1 tablet (8.6 mg total) by mouth at bedtime. (Patient taking differently: Take 1 tablet by mouth daily as needed for mild constipation.)   sucralfate  (CARAFATE ) 1 g tablet Take 1 tablet (1 g total) by mouth 4 (four) times daily -  with meals and at bedtime. Take one hour prior to morning medications.   valACYclovir  (VALTREX ) 500 MG tablet Take 1 tablet by mouth once every other  day   No facility-administered encounter medications on file as of 01/19/2024.    Allergies (verified) Ibuprofen   History: Past Medical History:  Diagnosis Date   Allergic rhinitis    Allergy    seasonal allergies   Anxiety    CKD (chronic kidney disease) 04/04/2017   COPD (chronic obstructive pulmonary disease) (HCC)    uses inhaler   Depression    Fatty liver    GERD (gastroesophageal reflux disease)    Gout    hx of   Hyperlipidemia    on meds   Hypertension    on meds   Myocardial infarction [facility])    Peptic ulcer 01/04/2003   Vertigo  Past Surgical History:  Procedure Laterality Date   ABDOMINAL HYSTERECTOMY     APPENDECTOMY     BIOPSY  08/13/2020   Procedure: BIOPSY;  Surgeon: San Sandor GAILS, DO;  Location: [facility];  Service: Gastroenterology;;   CORONARY PRESSURE/FFR STUDY N/A 01/27/2022   Procedure: INTRAVASCULAR PRESSURE WIRE/FFR STUDY;  Surgeon: Wendel Lurena POUR, MD;  Location: [facility] INVASIVE CV LAB;  Service: Cardiovascular;  Laterality: N/A;   ESOPHAGOGASTRODUODENOSCOPY (EGD) WITH PROPOFOL  N/A 08/13/2020   Procedure: ESOPHAGOGASTRODUODENOSCOPY (EGD) WITH PROPOFOL ;  Surgeon: San Sandor GAILS, DO;  Location: [facility];  Service: Gastroenterology;  Laterality: N/A;   LEFT HEART CATH AND CORONARY ANGIOGRAPHY N/A 06/13/2017   Procedure: LEFT HEART CATH AND CORONARY ANGIOGRAPHY;  Surgeon: Anner Alm ORN, MD;  Location: [facility] INVASIVE CV LAB;  Service: Cardiovascular;  Laterality: N/A;   LEFT HEART CATH AND CORONARY ANGIOGRAPHY N/A 01/27/2022   Procedure: LEFT HEART CATH AND CORONARY ANGIOGRAPHY;  Surgeon: Wendel Lurena POUR, MD;  Location: [facility] INVASIVE CV LAB;  Service: Cardiovascular;  Laterality: N/A;   RIGHT/LEFT HEART CATH AND CORONARY ANGIOGRAPHY N/A 09/16/2017   Procedure: RIGHT/LEFT HEART CATH AND CORONARY ANGIOGRAPHY;  Surgeon: Swaziland, Peter M, MD;  Location: [facility] INVASIVE CV LAB;  Service: Cardiovascular;  Laterality: N/A;   SAVORY DILATION N/A  08/13/2020   Procedure: SAVORY DILATION;  Surgeon: San Sandor GAILS, DO;  Location: [facility];  Service: Gastroenterology;  Laterality: N/A;   ULTRASOUND GUIDANCE FOR VASCULAR ACCESS  09/16/2017   Procedure: Ultrasound Guidance For Vascular Access;  Surgeon: Swaziland, Peter M, MD;  Location: [facility] INVASIVE CV LAB;  Service: Cardiovascular;;   WISDOM TOOTH EXTRACTION     Family History  Problem Relation Age of Onset   Breast cancer Mother    Stroke Father    Stomach cancer Neg Hx    Colon cancer Neg Hx    Pancreatic cancer Neg Hx    Esophageal cancer Neg Hx    Colon polyps Neg Hx    Rectal cancer Neg Hx    Social History   Socioeconomic History   Marital status: Widowed    Spouse name: Not on file   Number of children: 1   Years of education: 38   Highest education level: 12th grade  Occupational History   Occupation: Retired  Tobacco Use   Smoking status: Former    Current packs/day: 0.00    Types: Cigarettes    Quit date: 04/24/2000    Years since quitting: 23.7    Passive exposure: Past   Smokeless tobacco: Never  Vaping Use   Vaping status: Never Used  Substance and Sexual Activity   Alcohol use: Not Currently    Comment: has previous hx of ETOH abuse, quit 2014   Drug use: No   Sexual activity: Not Currently  Other Topics Concern   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[place]   Grew up in [place]   Has daughter locally   Social Drivers of Corporate investment banker Strain: Low Risk  (01/19/2024)   Overall Financial Resource Strain (CARDIA)    Difficulty of Paying Living Expenses: Not hard at all  Food Insecurity: No Food Insecurity (01/19/2024)   Hunger Vital Sign    Worried About Running Out of Food in the Last Year: Never true    Ran Out of Food in the Last Year: Never true  Transportation Needs: No Transportation Needs (01/19/2024)   PRAPARE - Transportation  Lack of Transportation (Medical): No    Lack of  Transportation (Non-Medical): No  Physical Activity: Inactive (01/19/2024)   Exercise Vital Sign    Days of Exercise per Week: 0 days    Minutes of Exercise per Session: 0 min  Stress: No Stress Concern Present (01/19/2024)   [employer] of Occupational Health - Occupational Stress Questionnaire    Feeling of Stress: Not at all  Social Connections: Moderately Integrated (01/19/2024)   Social Connection and Isolation Panel    Frequency of Communication with Friends and Family: More than three times a week    Frequency of Social Gatherings with Friends and Family: More than three times a week    Attends Religious Services: More than 4 times per year    Active Member of [employer] or Organizations: Yes    Attends Banker Meetings: More than 4 times per year    Marital Status: Widowed    Tobacco Counseling Counseling given: Not Answered    Clinical Intake:  Pre-visit preparation completed: Yes  Pain : No/denies pain Pain Score: 0-No pain     BMI - recorded: 32.28 Nutritional Status: BMI > 30  Obese Nutritional Risks: None Diabetes: No  Lab Results  Component Value Date   HGBA1C 6.5 10/20/2023   HGBA1C 6.8 (H) 04/22/2023   HGBA1C 6.7 (H) 11/17/2022     How often do you need to have someone help you when you read instructions, pamphlets, or other written materials from your doctor or pharmacy?: 1 - Never  Interpreter Needed?: No  Information entered by :: Rojelio Blush LPN   Activities of Daily Living     01/19/2024   10:19 AM  In your present state of health, do you have any difficulty performing the following activities:  Hearing? 0  Vision? 0  Difficulty concentrating or making decisions? 0  Walking or climbing stairs? 0  Dressing or bathing? 0  Doing errands, shopping? 0  Preparing Food and eating ? N  Using the Toilet? N  In the past six months, have you accidently leaked urine? Y  Comment Wears  Breifs followed by PCP  Do you have  problems with loss of bowel control? N  Managing your Medications? N  Managing your Finances? N  Housekeeping or managing your Housekeeping? N    Patient Care Team: Daryl Setter, NP as PCP - General (Internal Medicine) Tobb, Kardie, DO as PCP - Cardiology (Cardiology) Carla Milling, RPH-CPP (Pharmacist)  I have updated your Care Teams any recent Medical Services you may have received from other providers in the past year.     Assessment:   This is a routine wellness examination for [place].  Hearing/Vision screen Hearing Screening - Comments:: Denies hearing difficulties   Vision Screening - Comments:: Wears rx glasses - up to date with routine eye exams with  Dr Abigail   Goals Addressed               This Visit's Progress     Increase physical activity (pt-stated)        Get more active       Depression Screen     01/19/2024   10:18 AM 12/28/2023   10:33 AM 04/22/2023   11:47 AM 11/17/2022    9:57 AM 07/26/2022    9:08 AM 06/09/2022    3:07 PM 02/05/2022   11:04 AM  PHQ 2/9 Scores  PHQ - 2 Score 0 4 5 0 0 0 0  PHQ- 9 Score 0  6 11 0 0      Fall Risk     01/19/2024   10:21 AM 11/17/2022    9:57 AM 07/26/2022    9:09 AM 06/09/2022    3:03 PM 02/05/2022   11:04 AM  Fall Risk   Falls in the past year? 0 0 0 0 1  Number falls in past yr: 0 0 0 0 1  Injury with Fall? 0 0 0 0 1  Risk for fall due to : No Fall Risks No Fall Risks No Fall Risks No Fall Risks History of fall(s);Impaired balance/gait  Follow up Falls evaluation completed Falls evaluation completed Falls evaluation completed Falls evaluation completed Falls evaluation completed;Falls prevention discussed      Data saved with a previous flowsheet row definition    MEDICARE RISK AT HOME:  Medicare Risk at Home Any stairs in or around the home?: No If so, are there any without handrails?: No Home free of loose throw rugs in walkways, pet beds, electrical cords, etc?: Yes Adequate lighting in your home  to reduce risk of falls?: Yes Life alert?: No Use of a cane, walker or w/c?: No Grab bars in the bathroom?: No Shower chair or bench in shower?: No Elevated toilet seat or a handicapped toilet?: No  TIMED UP AND GO:  Was the test performed?  No  Cognitive Function: 6CIT completed    06/09/2022    3:17 PM  MMSE - Mini Mental State Exam  Not completed: Unable to complete        01/19/2024   10:22 AM  6CIT Screen  What Year? 0 points  What month? 0 points  What time? 0 points  Count back from 20 0 points  Months in reverse 0 points  Repeat phrase 0 points  Total Score 0 points    Immunizations Immunization History  Administered Date(s) Administered   Fluad Quad(high Dose 65+) 03/14/2020, 12/03/2020   Fluad Trivalent(High Dose 65+) 04/22/2023   INFLUENZA, HIGH DOSE SEASONAL PF 01/23/2018, 12/07/2018, 12/28/2023   Influenza-Unspecified 12/23/2018   PFIZER(Purple Top)SARS-COV-2 Vaccination 05/14/2019, 06/08/2019   Pneumococcal Conjugate-13 07/16/2015   Pneumococcal Polysaccharide-23 12/23/2016   Tdap 09/02/2009, 04/05/2017    Screening Tests Health Maintenance  Topic Date Due   OPHTHALMOLOGY EXAM  Never done   Zoster Vaccines- Shingrix (1 of 2) Never done   COVID-19 Vaccine (3 - Pfizer risk series) 07/06/2019   HEMOGLOBIN A1C  04/21/2024   FOOT EXAM  07/04/2024   Diabetic kidney evaluation - Urine ACR  07/05/2024   Diabetic kidney evaluation - eGFR measurement  12/19/2024   Medicare Annual Wellness (AWV)  01/18/2025   DTaP/Tdap/Td (3 - Td or Tdap) 04/06/2027   Pneumococcal Vaccine: 50+ Years  Completed   Influenza Vaccine  Completed   DEXA SCAN  Completed   Meningococcal B Vaccine  Aged Out    Health Maintenance Items Addressed:   Additional Screening:  Vision Screening: Recommended annual ophthalmology exams for early detection of glaucoma and other disorders of the eye. Is the patient up to date with their annual eye exam?  Yes  Who is the provider or  what is the name of the office in which the patient attends annual eye exams? Dr Abigail  Dental Screening: Recommended annual dental exams for proper oral hygiene  Community Resource Referral / Chronic Care Management: CRR required this visit?  No   CCM required this visit?  No   Plan:    I have personally reviewed and noted the  following in the patient's chart:   Medical and social history Use of alcohol, tobacco or illicit drugs  Current medications and supplements including opioid prescriptions. Patient is not currently taking opioid prescriptions. Functional ability and status Nutritional status Physical activity Advanced directives List of other physicians Hospitalizations, surgeries, and ER visits in previous 12 months Vitals Screenings to include cognitive, depression, and falls Referrals and appointments  In addition, I have reviewed and discussed with patient certain preventive protocols, quality metrics, and best practice recommendations. A written personalized care plan for preventive services as well as general preventive health recommendations were provided to patient.   Rojelio LELON Blush, LPN   [id number]/[id number]   After Visit Summary: (MyChart) Due to this being a telephonic visit, the after visit summary with patients personalized plan was offered to patient via MyChart   Notes: Nothing significant to report at this time.

## 2024-01-23 ENCOUNTER — Other Ambulatory Visit (HOSPITAL_COMMUNITY): Payer: Self-pay

## 2024-01-23 ENCOUNTER — Other Ambulatory Visit: Payer: Self-pay | Admitting: Family

## 2024-01-23 NOTE — Telephone Encounter (Signed)
 Diazepam  10mg  denied, refill too soon. Last refilled on 01/03/24 for 60 tablets.

## 2024-01-24 ENCOUNTER — Other Ambulatory Visit (HOSPITAL_COMMUNITY): Payer: Self-pay

## 2024-01-24 ENCOUNTER — Other Ambulatory Visit: Payer: Self-pay

## 2024-01-24 NOTE — Telephone Encounter (Unsigned)
 Copied from CRM #[id number]. Topic: Clinical - Prescription Issue >> Jan 24, 2024 11:43 AM Berneda FALCON wrote: Reason for CRM: Patient states she has been out of her Diazepam  10 mg for over a week. Patient states she has been taking it as directed and mostly at night. She would like to know why it is too soon and would like this to be refilled for her please.  [facility] [address], [place] [address] Phone: [phone]  Fax: [phone] DEA #: [id number]  Patient callback is [phone]

## 2024-01-24 NOTE — Telephone Encounter (Signed)
 Rx was written on 9/30 and filled on 10/2.  Cannot be filled prior to 10/30.

## 2024-01-25 ENCOUNTER — Other Ambulatory Visit: Payer: Self-pay | Admitting: Family

## 2024-01-25 ENCOUNTER — Other Ambulatory Visit: Payer: Self-pay

## 2024-01-25 DIAGNOSIS — R1013 Epigastric pain: Secondary | ICD-10-CM

## 2024-01-25 NOTE — Telephone Encounter (Signed)
 Patient notified of this information.

## 2024-01-26 ENCOUNTER — Other Ambulatory Visit: Payer: Self-pay

## 2024-01-26 ENCOUNTER — Other Ambulatory Visit (HOSPITAL_COMMUNITY): Payer: Self-pay

## 2024-01-26 MED ORDER — SUCRALFATE 1 G PO TABS
1.0000 g | ORAL_TABLET | Freq: Three times a day (TID) | ORAL | 0 refills | Status: DC
Start: 2024-01-26 — End: 2024-02-22
  Filled 2024-01-26 – 2024-01-31 (×2): qty 120, 30d supply, fill #0

## 2024-01-26 MED ORDER — FUROSEMIDE 40 MG PO TABS
ORAL_TABLET | ORAL | 0 refills | Status: DC
  Filled 2024-01-26: qty 30, 30d supply, fill #0
  Filled 2024-01-31: qty 30, 60d supply, fill #0

## 2024-01-27 ENCOUNTER — Other Ambulatory Visit: Payer: Self-pay

## 2024-01-30 ENCOUNTER — Other Ambulatory Visit (HOSPITAL_COMMUNITY): Payer: Self-pay

## 2024-01-30 ENCOUNTER — Other Ambulatory Visit: Payer: Self-pay

## 2024-01-31 ENCOUNTER — Other Ambulatory Visit: Payer: Self-pay

## 2024-01-31 ENCOUNTER — Ambulatory Visit: Admitting: Cardiology

## 2024-02-01 ENCOUNTER — Other Ambulatory Visit (HOSPITAL_COMMUNITY): Payer: Self-pay

## 2024-02-01 ENCOUNTER — Other Ambulatory Visit: Payer: Self-pay | Admitting: Family

## 2024-02-01 ENCOUNTER — Other Ambulatory Visit: Payer: Self-pay

## 2024-02-01 MED ORDER — DIAZEPAM 10 MG PO TABS
10.0000 mg | ORAL_TABLET | Freq: Two times a day (BID) | ORAL | 0 refills | Status: DC | PRN
  Filled 2024-02-01 – 2024-02-02 (×2): qty 60, 30d supply, fill #0

## 2024-02-02 ENCOUNTER — Other Ambulatory Visit (HOSPITAL_COMMUNITY): Payer: Self-pay

## 2024-02-02 ENCOUNTER — Other Ambulatory Visit (HOSPITAL_BASED_OUTPATIENT_CLINIC_OR_DEPARTMENT_OTHER): Payer: Self-pay

## 2024-02-02 ENCOUNTER — Other Ambulatory Visit: Payer: Self-pay

## 2024-02-03 ENCOUNTER — Other Ambulatory Visit: Payer: Self-pay

## 2024-02-03 ENCOUNTER — Other Ambulatory Visit (HOSPITAL_COMMUNITY): Payer: Self-pay

## 2024-02-03 ENCOUNTER — Telehealth: Payer: Self-pay | Admitting: Pharmacist

## 2024-02-03 ENCOUNTER — Other Ambulatory Visit (HOSPITAL_BASED_OUTPATIENT_CLINIC_OR_DEPARTMENT_OTHER): Payer: Self-pay

## 2024-02-03 NOTE — Progress Notes (Signed)
 Patient left a message on my VM that she wanted to review her medication because she was not taking all her medications however when I spoke with her she said she just received her packaging and the she was taking everything in the packaging. She is taking diazepam  as needed and valacyclovir  every other day.  I reviewed her medication list and she did report she has missed metoprolol  that last 1 or 2 weeks because she forgot to take it. Recommended she place the bottle by her bed with her diazepam .  Reminded that dose of metoprolol  ER 25mg  is take 0.5 tablet once daily. Patient repeated back directions. Her daughter Kate was with her and heard instructions as well. Patient is still living with her daughter Kate.   Madelin Ray, PharmD Clinical Pharmacist [facility]  Population Health [phone]

## 2024-02-08 ENCOUNTER — Other Ambulatory Visit: Payer: Self-pay

## 2024-02-17 ENCOUNTER — Other Ambulatory Visit (HOSPITAL_COMMUNITY): Payer: Self-pay

## 2024-02-22 ENCOUNTER — Other Ambulatory Visit (HOSPITAL_COMMUNITY): Payer: Self-pay

## 2024-02-22 ENCOUNTER — Other Ambulatory Visit: Payer: Self-pay | Admitting: Family

## 2024-02-22 ENCOUNTER — Other Ambulatory Visit: Payer: Self-pay

## 2024-02-22 DIAGNOSIS — M109 Gout, unspecified: Secondary | ICD-10-CM

## 2024-02-22 DIAGNOSIS — R1013 Epigastric pain: Secondary | ICD-10-CM

## 2024-02-22 DIAGNOSIS — I2510 Atherosclerotic heart disease of native coronary artery without angina pectoris: Secondary | ICD-10-CM

## 2024-02-22 DIAGNOSIS — E782 Mixed hyperlipidemia: Secondary | ICD-10-CM

## 2024-02-22 DIAGNOSIS — F419 Anxiety disorder, unspecified: Secondary | ICD-10-CM

## 2024-02-22 DIAGNOSIS — E876 Hypokalemia: Secondary | ICD-10-CM

## 2024-02-22 NOTE — Telephone Encounter (Unsigned)
 Copied from CRM #[id number]. Topic: Clinical - Medication Question >> Feb 22, 2024  3:54 PM Lauren C wrote: Reason for CRM: Patient returning call to Tammy in pharmacy. She said she would return call after meeting. EDIT: turns out she is not returning a call, she wants to know if the potassium medication she was taking previously can be continued since she was off of them for 2 months. She can get the medication but doesn't know if she should start at lower mg since it has been 2 months. [phone]

## 2024-02-22 NOTE — Telephone Encounter (Signed)
 Patient usually gets medications in adherence packs. Potassium should be included in her adherence packs.  Called patient. She said she had a bottle of potassium but also thinks potassium is included in her adherence packs that she just received and she was not sure if she should take potassium from the bottle.  Had patient check her adherence box to see if potassium listed. She did find medication packs for 02/22/2024 and confirmed that potassium was in her pack.   She also had several extra days - 11/12, 11/13 and 11/14 - she states that she went on vacation and did not take her meds with her.   I have also reached out to adherence team to make sure that potassium is included in her adherence packs and to see when she is due to restart her next adherence packs / when it will be delivered.

## 2024-02-23 ENCOUNTER — Telehealth: Payer: Self-pay | Admitting: Pharmacist

## 2024-02-23 ENCOUNTER — Other Ambulatory Visit: Payer: Self-pay

## 2024-02-23 ENCOUNTER — Other Ambulatory Visit: Payer: Self-pay | Admitting: Pharmacist

## 2024-02-23 MED ORDER — POTASSIUM CHLORIDE CRYS ER 20 MEQ PO TBCR
20.0000 meq | EXTENDED_RELEASE_TABLET | Freq: Every day | ORAL | 0 refills | Status: AC
  Filled 2024-02-23 – 2024-02-28 (×2): qty 30, 30d supply, fill #0
  Filled 2024-04-11: qty 30, 30d supply, fill #1
  Filled 2024-05-11: qty 30, 30d supply, fill #2

## 2024-02-23 MED ORDER — LISINOPRIL 2.5 MG PO TABS
2.5000 mg | ORAL_TABLET | Freq: Every day | ORAL | 0 refills | Status: AC
  Filled 2024-02-23 – 2024-02-28 (×2): qty 30, 30d supply, fill #0
  Filled 2024-04-11: qty 30, 30d supply, fill #1
  Filled 2024-05-11: qty 30, 30d supply, fill #2

## 2024-02-23 MED ORDER — SUCRALFATE 1 G PO TABS
1.0000 g | ORAL_TABLET | Freq: Three times a day (TID) | ORAL | 0 refills | Status: DC
  Filled 2024-02-23 – 2024-02-28 (×2): qty 120, 30d supply, fill #0

## 2024-02-23 MED ORDER — ISOSORBIDE MONONITRATE ER 60 MG PO TB24
60.0000 mg | ORAL_TABLET | Freq: Every day | ORAL | 0 refills | Status: AC
  Filled 2024-02-23 – 2024-02-28 (×2): qty 30, 30d supply, fill #0
  Filled 2024-04-12 (×2): qty 30, 30d supply, fill #1
  Filled 2024-05-11: qty 30, 30d supply, fill #2

## 2024-02-23 MED ORDER — ALLOPURINOL 100 MG PO TABS
200.0000 mg | ORAL_TABLET | Freq: Every day | ORAL | 0 refills | Status: AC
  Filled 2024-02-23 – 2024-02-28 (×2): qty 60, 30d supply, fill #0
  Filled 2024-04-11: qty 60, 30d supply, fill #1
  Filled 2024-05-11: qty 60, 30d supply, fill #2

## 2024-02-23 MED ORDER — FUROSEMIDE 40 MG PO TABS
ORAL_TABLET | ORAL | 0 refills | Status: AC
  Filled 2024-02-23: qty 30, 60d supply, fill #0

## 2024-02-23 MED ORDER — EZETIMIBE 10 MG PO TABS
10.0000 mg | ORAL_TABLET | Freq: Every day | ORAL | 0 refills | Status: AC
  Filled 2024-02-23 – 2024-02-28 (×2): qty 30, 30d supply, fill #0
  Filled 2024-04-11: qty 30, 30d supply, fill #1
  Filled 2024-05-11: qty 30, 30d supply, fill #2

## 2024-02-23 MED ORDER — ESCITALOPRAM OXALATE 20 MG PO TABS
20.0000 mg | ORAL_TABLET | Freq: Every day | ORAL | 0 refills | Status: AC
  Filled 2024-02-23 – 2024-02-28 (×2): qty 30, 30d supply, fill #0
  Filled 2024-04-12: qty 30, 30d supply, fill #1
  Filled 2024-05-11: qty 30, 30d supply, fill #2

## 2024-02-23 MED ORDER — ATORVASTATIN CALCIUM 80 MG PO TABS
80.0000 mg | ORAL_TABLET | Freq: Every day | ORAL | 0 refills | Status: AC
  Filled 2024-02-23 – 2024-02-28 (×2): qty 30, 30d supply, fill #0
  Filled 2024-04-12: qty 30, 30d supply, fill #1
  Filled 2024-05-11: qty 30, 30d supply, fill #2

## 2024-02-23 NOTE — Telephone Encounter (Signed)
 Attempt was made to contact patient by phone today for follow up by Clinical Pharmacist regarding medication management.  Unable to reach patient. LM on VM with my contact number [phone].   I also called mobile number on patient's file. Reached her daughter Kate. Kate is on patient's DPI - see phone visit note from 11/20 for documentation

## 2024-02-23 NOTE — Progress Notes (Signed)
 02/23/2024 Name: Veronica Wolf MRN: [id number] DOB: 12-28-1940  Chief Complaint  Patient presents with   Medication Management    Veronica Wolf is a 83 y.o. year old female who has been referred to the pharmacist by their PCP for assistance in managing complex medication management.  I was not able to reach Veronica Wolf today but I did speak with her daughter, Veronica Wolf who is on her DPI release.    Subjective:  Care Team: Primary Care Provider: Daryl Setter, NP ; Next Scheduled Visit: not currently scheduled Cardiologist: Dr Sheena; Next Scheduled Visit: 03/08/2024  Medication Access/Adherence  Current Pharmacy:  [facility] - [facility] [address] [place] [address] Phone: [phone] Fax: [phone]  [facility] - [address] ([place]), [place] - [address] [place] ([place]) [place] [address] Phone: [phone] Fax: [phone]  [facility] [address] [place], [place] - [address] [place] [address] Phone: [phone] Fax: [phone]  [facility] - [facility] [address] [place] [address] Phone: [phone] Fax: [phone]   Patient reports affordability concerns with their medications: Yes  Patient reports access/transportation concerns to their pharmacy: Yes  - daughter and son in law help some with transportation. Patient is currently living with her daughter.  Patient reports adherence concerns with their medications:  Yes   - patient called me yesterday and was very confused about her medication packs. She states that she would run out of adherence packs today 02/23/2024.   Consulted with Adherence pack team - patient's last adherence box was shipped 02/03/2024 with a start date of 02/18/2024. Ending date would be 03/18/2024.   Patient's daughter reports that sometimes Veronica Wolf gets her  medications mixed up because she will detach the adherence pack roll. Patient like to organize her packs by day of the week.   Objective:  Lab Results  Component Value Date   HGBA1C 6.5 10/20/2023    Lab Results  Component Value Date   CREATININE 1.56 (H) 12/20/2023   BUN 16 12/20/2023   NA 140 12/20/2023   K 3.4 (L) 12/20/2023   CL 109 12/20/2023   CO2 20 (L) 12/20/2023    Lab Results  Component Value Date   CHOL 242 (H) 04/22/2023   HDL 45.50 04/22/2023   LDLCALC 153 (H) 04/22/2023   TRIG 217.0 (H) 04/22/2023   CHOLHDL 5 04/22/2023    Current Outpatient Medications  Medication Instructions   albuterol  (PROVENTIL ) (2.5 MG/3ML) 0.083% nebulizer solution USE 1 VIAL IN NEBULIZER EVERY 6 HOURS AS NEEDED FOR WHEEZING AND FOR SHORTNESS OF BREATH   albuterol  (VENTOLIN  HFA) 108 (90 Base) MCG/ACT inhaler 2 puffs, Inhalation, Every 6 hours PRN   allopurinol  (ZYLOPRIM ) 200 mg, Oral, Daily   amoxicillin -clavulanate (AUGMENTIN ) 875-125 MG tablet 1 tablet, Oral, Every 12 hours   aspirin  EC 81 mg, Oral, Daily, Swallow whole.   atorvastatin  (LIPITOR ) 80 MG tablet Take 1 tablet (80 mg total) by mouth daily at 6 PM   Blood Glucose Monitoring Suppl (ACCU-CHEK GUIDE ME) w/Device KIT Use to test blood sugar in the morning, at noon, and at bedtime.   clobetasol  ointment (TEMOVATE ) 0.05 % 1 Application, Topical, 2 times daily, For up to 2 weeks.   colchicine  0.6 mg, Daily PRN   diazepam  (VALIUM ) 10 MG tablet Take 1 tablet (10 mg total) by mouth every 12 (twelve) hours as needed  for anxiety.   dicyclomine  (BENTYL ) 10 mg, Oral, 2 times daily   escitalopram  (LEXAPRO ) 20 MG tablet Take 1 tablet (20 mg total) by mouth daily with lunch   ezetimibe  (ZETIA ) 10 mg, Oral, Daily with lunch   famotidine  (PEPCID ) 20 mg, Oral, 2 times daily   furosemide  (LASIX ) 40 MG tablet Take 0.5 tablets (20 mg total) by mouth daily as needed. Take only for swelling or weight gain (2 lbs in a day or 5 lbs in a week).   Call your PCP for follow up if you are using this frequently.   ibuprofen (ADVIL) 200 mg, Every 6 hours PRN   isosorbide  mononitrate (IMDUR ) 60 mg, Oral, Daily-1800   levothyroxine  (SYNTHROID ) 25 MCG tablet Take 1 tablet (25 mcg total) by mouth before breakfast.   lisinopril  (ZESTRIL ) 2.5 mg, Oral, Daily with lunch   meclizine  (ANTIVERT ) 25 mg, Oral, 3 times daily PRN   metoprolol  succinate (TOPROL -XL) 12.5 mg, Oral, Every evening   nitroGLYCERIN  (NITROSTAT ) 0.4 mg, Sublingual, Every 5 min PRN   nystatin  (MYCOSTATIN /NYSTOP ) powder Apply 1 Application topically to the affected area(s) 2 (two) times daily.   ondansetron  (ZOFRAN ) 4 mg, Oral, Every 8 hours PRN   polyethylene glycol powder (GLYCOLAX /MIRALAX ) 17 g, Daily   potassium chloride  SA (KLOR-CON  M) 20 MEQ tablet 20 mEq, Oral, Daily   Probiotic Product (PROBIOTIC DAILY) CAPS 1 tablet, Oral, Daily   Respiratory Therapy Supplies (NEBULIZER/TUBING/MOUTHPIECE) KIT Use as directed   senna (SENOKOT) 8.6 mg, Oral, Daily at bedtime   sucralfate  (CARAFATE ) 1 g, Oral, 3 times daily with meals & bedtime, Take one hour prior to morning medications.   valACYclovir  (VALTREX ) 500 MG tablet Take 1 tablet by mouth once every other day      Assessment/Plan:   Medication Management: - Patient should have enough adherence packs to last until 03/18/2024. Her daughter will review her packs and help patient organize them.  - Encouraged that she not separate the packs. It should keep them in order if they remain on the roll and in the dispensing box.  - Encouraged daughter to assist with medication administration since patient is currently living with her daughter.  They can come into the office to review medication administration if needed. Veronica will call me if she has any questions for difficulties.  - If she continues to have difficulty with adherence packs we can review other options to see if we can come up with a system that works best for Veronica Wolf.    Follow Up Plan: 2 weeks  Madelin Ray, PharmD Clinical Pharmacist [facility]

## 2024-02-24 ENCOUNTER — Other Ambulatory Visit (HOSPITAL_COMMUNITY): Payer: Self-pay

## 2024-02-24 ENCOUNTER — Other Ambulatory Visit: Payer: Self-pay

## 2024-02-27 ENCOUNTER — Other Ambulatory Visit: Payer: Self-pay

## 2024-02-27 ENCOUNTER — Other Ambulatory Visit (HOSPITAL_COMMUNITY): Payer: Self-pay

## 2024-02-28 ENCOUNTER — Other Ambulatory Visit: Payer: Self-pay

## 2024-02-29 ENCOUNTER — Other Ambulatory Visit: Payer: Self-pay

## 2024-03-02 ENCOUNTER — Other Ambulatory Visit: Payer: Self-pay

## 2024-03-05 ENCOUNTER — Other Ambulatory Visit (HOSPITAL_COMMUNITY): Payer: Self-pay

## 2024-03-05 ENCOUNTER — Other Ambulatory Visit: Payer: Self-pay | Admitting: Family

## 2024-03-05 ENCOUNTER — Other Ambulatory Visit: Payer: Self-pay

## 2024-03-05 MED ORDER — DIAZEPAM 10 MG PO TABS
10.0000 mg | ORAL_TABLET | Freq: Two times a day (BID) | ORAL | 0 refills | Status: DC | PRN
  Filled 2024-03-05 – 2024-03-07 (×4): qty 60, 30d supply, fill #0

## 2024-03-05 NOTE — Telephone Encounter (Signed)
 Copied from CRM #[id number]. Topic: Clinical - Medication Refill >> Mar 05, 2024  7:58 AM Franky GRADE wrote: Medication: Rx #: [id number]  diazepam  (VALIUM ) 10 MG tablet [[id number]]    Has the patient contacted their pharmacy? Yes (Agent: If no, request that the patient contact the pharmacy for the refill. If patient does not wish to contact the pharmacy document the reason why and proceed with request.) (Agent: If yes, when and what did the pharmacy advise?)  This is the patient's preferred pharmacy:  [place] [facility] [address] [place] [address] Phone: [phone] Fax: [phone]  Is this the correct pharmacy for this prescription? Yes If no, delete pharmacy and type the correct one.   Has the prescription been filled recently? No  Is the patient out of the medication? Yes  Has the patient been seen for an appointment in the last year OR does the patient have an upcoming appointment? Yes  Can we respond through MyChart? No  Agent: Please be advised that Rx refills may take up to 3 business days. We ask that you follow-up with your pharmacy.

## 2024-03-05 NOTE — Telephone Encounter (Signed)
 Requesting: diazepam  10mg   Contract:07/26/22 UDS: 07/26/22 Last Visit: 12/28/23 Next Visit: None Last Refill: 02/01/24 #60 and 0RF   Please Advise

## 2024-03-06 ENCOUNTER — Other Ambulatory Visit (HOSPITAL_COMMUNITY): Payer: Self-pay

## 2024-03-06 ENCOUNTER — Other Ambulatory Visit: Payer: Self-pay

## 2024-03-07 ENCOUNTER — Other Ambulatory Visit (HOSPITAL_COMMUNITY): Payer: Self-pay

## 2024-03-07 ENCOUNTER — Other Ambulatory Visit: Payer: Self-pay

## 2024-03-08 ENCOUNTER — Ambulatory Visit: Admitting: Cardiology

## 2024-03-08 ENCOUNTER — Encounter: Payer: Self-pay | Admitting: Cardiology

## 2024-03-08 ENCOUNTER — Other Ambulatory Visit: Payer: Self-pay

## 2024-03-08 ENCOUNTER — Ambulatory Visit: Attending: Cardiology | Admitting: Cardiology

## 2024-03-08 VITALS — BP 104/60 | HR 80 | Ht 64.0 in | Wt 200.2 lb

## 2024-03-08 DIAGNOSIS — I2510 Atherosclerotic heart disease of native coronary artery without angina pectoris: Secondary | ICD-10-CM

## 2024-03-08 DIAGNOSIS — I10 Essential (primary) hypertension: Secondary | ICD-10-CM

## 2024-03-08 DIAGNOSIS — E782 Mixed hyperlipidemia: Secondary | ICD-10-CM

## 2024-03-08 NOTE — Patient Instructions (Addendum)
 Medication Instructions:  Your physician recommends that you continue on your current medications as directed. Please refer to the Current Medication list given to you today.  *If you need a refill on your cardiac medications before your next appointment, please call your pharmacy*   Follow-Up: At [facility], you and your health needs are our priority.  As part of our continuing mission to provide you with exceptional heart care, our providers are all part of one team.  This team includes your primary Cardiologist (physician) and Advanced Practice Providers or APPs (Physician Assistants and Nurse Practitioners) who all work together to provide you with the care you need, when you need it.  Your next appointment:   1 year(s)  Provider:   Kardie Tobb, DO    Other instructions: Please review medications and call the office to verify medications.

## 2024-03-09 ENCOUNTER — Ambulatory Visit: Admitting: Family

## 2024-03-09 ENCOUNTER — Telehealth: Payer: Self-pay | Admitting: Pharmacist

## 2024-03-09 ENCOUNTER — Ambulatory Visit: Payer: Self-pay

## 2024-03-09 ENCOUNTER — Other Ambulatory Visit: Payer: Self-pay | Admitting: Pharmacist

## 2024-03-09 NOTE — Telephone Encounter (Signed)
 FYI Only or Action Required?: FYI only for provider: Appt rescheduled to 12/8, requesting cb from office about lactose testing.  Patient was last seen in primary care on 12/28/2023 by Daryl Setter, NP.  Called Nurse Triage reporting Abdominal Pain.  Symptoms began several months ago.  Interventions attempted: OTC medications: Pepto Bismol.  Symptoms are: stable.  Triage Disposition: See PCP Within 2 Weeks  Patient/caregiver understands and will follow disposition?: Yes Reason for Disposition  Abdominal pain is a chronic symptom (recurrent or ongoing AND present > 4 weeks)  Answer Assessment - Initial Assessment Questions Patient's daughter Kate calling in today. Reports patient is constipated, patient takes Pepto Bismol and daily pill to help move bowels, cannot remember the name. Patient's daughter is wanting to know if patient can get lactose intolerance testing done at the appointment and is requesting a call back regarding the test, please advise. Call back [place] [phone]  1. LOCATION: Where does it hurt?      Upper abdomen, near belly button  2. RADIATION: Does the pain shoot anywhere else? (e.g., chest, back)     Sometimes back  3. ONSET: When did the pain begin? (e.g., minutes, hours or days ago)      Months  4. PATTERN Does the pain come and go, or is it constant?     Off and on  5. CAUSE: What do you think is causing the stomach pain? (e.g., gallstones, recent abdominal surgery)     Unsure  6. OTHER SYMPTOMS: Do you have any other symptoms? (e.g., back pain, diarrhea, fever, urination pain, vomiting)       On and off nausea, headaches  Protocols used: Abdominal Pain - [facility]  Copied from CRM #[id number]. Topic: Clinical - Red Word Triage >> Mar 09, 2024  8:18 AM Adelita E wrote: [facility] that prompted transfer to Nurse Triage: Stomach pain, comes and goes. Daughter, Kate, called in stating they do not want to drive in the weather, so  wanting to push her appointment out.

## 2024-03-09 NOTE — Telephone Encounter (Signed)
 Patient scheduled to come in Monday to see Dr Frann

## 2024-03-09 NOTE — Telephone Encounter (Signed)
 Attempt was made to contact patient by phone today for follow up by Clinical Pharmacist regarding medication management.  Unable to reach patient. LM on VM with my contact number [phone].   I also tried alternative number on patient profile. I reached her daughter Veronica Wolf. Asked if patient has received her next adherence packs yet. They have not arrived per Veronica Wolf but I anticipate it might be either later today or tomorrow.   Will try to reach patient again next week.

## 2024-03-09 NOTE — Telephone Encounter (Signed)
 Daughter, Kate, calling back. PAS unable to warm transfer and states call disconnected. Attempted call back to daughter, Kate. No answer. Left voicemail. 1st attempt. PAS unable to state what the reason for the call was.

## 2024-03-10 NOTE — Progress Notes (Unsigned)
 Cardiology Office Note:    Date:  03/10/2024   ID:  IDAMAE Wolf, DOB 1941/02/07, MRN [id number]  PCP:  Daryl Setter, NP  Cardiologist:  Journee Bobrowski, DO  Electrophysiologist:  None   Referring MD: Daryl Setter, NP   No chief complaint on file. ***  History of Present Illness:    Veronica Wolf is a 83 y.o. female with a  hx of of coronary artery disease and coronary angiography revealed mild coronary artery disease in 2019, Mixed hyperlipidemia, CKD, hypertension .   She was last seen by me 02/2022. Since her visit she denies any hospitalization or urgent care visits. She is happy that her health has been stable.   She is here with her daughter..   Past Medical History:  Diagnosis Date   Allergic rhinitis    Allergy    seasonal allergies   Anxiety    CKD (chronic kidney disease) 04/04/2017   COPD (chronic obstructive pulmonary disease) (HCC)    uses inhaler   Depression    Fatty liver    GERD (gastroesophageal reflux disease)    Gout    hx of   Hyperlipidemia    on meds   Hypertension    on meds   Myocardial infarction [facility])    Peptic ulcer 01/04/2003   Vertigo     Past Surgical History:  Procedure Laterality Date   ABDOMINAL HYSTERECTOMY     APPENDECTOMY     BIOPSY  08/13/2020   Procedure: BIOPSY;  Surgeon: San Sandor GAILS, DO;  Location: [facility];  Service: Gastroenterology;;   CORONARY PRESSURE/FFR STUDY N/A 01/27/2022   Procedure: INTRAVASCULAR PRESSURE WIRE/FFR STUDY;  Surgeon: Wendel Lurena POUR, MD;  Location: [facility] INV[facility];  Service: Cardiovascular;  Laterality: N/A;   ESOPHAGOGASTRODUODENOSCOPY (EGD) WITH PROPOFOL  N/A 08/13/2020   Procedure: ESOPHAGOGASTRODUODENOSCOPY (EGD) WITH PROPOFOL ;  Surgeon: San Sandor GAILS, DO;  Location: [facility];  Service: Gastroenterology;  Laterality: N/A;   LEFT HEART CATH AND CORONARY ANGIOGRAPHY N/A 06/13/2017   Procedure: LEFT HEART CATH AND CORONARY ANGIOGRAPHY;  Surgeon: Anner Alm ORN,  MD;  Location: [facility] INVASIVE CV LAB;  Service: Cardiovascular;  Laterality: N/A;   LEFT HEART CATH AND CORONARY ANGIOGRAPHY N/A 01/27/2022   Procedure: LEFT HEART CATH AND CORONARY ANGIOGRAPHY;  Surgeon: Wendel Lurena POUR, MD;  Location: [facility];  Service: Cardiovascular;  Laterality: N/A;   RIGHT/LEFT HEART CATH AND CORONARY ANGIOGRAPHY N/A 09/16/2017   Procedure: RIGHT/LEFT HEART CATH AND CORONARY ANGIOGRAPHY;  Surgeon: Jordan, Peter M, MD;  Location: [facility] INVASIVE CV LAB;  Service: Cardiovascular;  Laterality: N/A;   SAVORY DILATION N/A 08/13/2020   Procedure: SAVORY DILATION;  Surgeon: San Sandor GAILS, DO;  Location: [facility];  Service: Gastroenterology;  Laterality: N/A;   ULTRASOUND GUIDANCE FOR VASCULAR ACCESS  09/16/2017   Procedure: Ultrasound Guidance For Vascular Access;  Surgeon: Jordan, Peter M, MD;  Location: [facility] INVASIVE CV LAB;  Service: Cardiovascular;;   WISDOM TOOTH EXTRACTION      Current Medications: Current Meds  Medication Sig   albuterol  (PROVENTIL ) (2.5 MG/3ML) 0.083% nebulizer solution USE 1 VIAL IN NEBULIZER EVERY 6 HOURS AS NEEDED FOR WHEEZING AND FOR SHORTNESS OF BREATH   albuterol  (VENTOLIN  HFA) 108 (90 Base) MCG/ACT inhaler Inhale 2 puffs into the lungs every 6 (six) hours as needed for wheezing or shortness of breath.   allopurinol  (ZYLOPRIM ) 100 MG tablet Take 2 tablets (200 mg total) by mouth daily.   aspirin  81 MG EC tablet Take  1 tablet (81 mg total) by mouth daily. Swallow whole.   atorvastatin  (LIPITOR ) 80 MG tablet Take 1 tablet (80 mg total) by mouth daily at 6 PM   Blood Glucose Monitoring Suppl (ACCU-CHEK GUIDE ME) w/Device KIT Use to test blood sugar in the morning, at noon, and at bedtime.   colchicine  0.6 MG tablet Take 0.6 mg by mouth daily as needed (gout).   diazepam  (VALIUM ) 10 MG tablet Take 1 tablet (10 mg total) by mouth every 12 (twelve) hours as needed for anxiety.   dicyclomine  (BENTYL ) 10 MG capsule Take 1 capsule (10 mg total)  by mouth 2 (two) times daily.   escitalopram  (LEXAPRO ) 20 MG tablet Take 1 tablet (20 mg total) by mouth daily with lunch   ezetimibe  (ZETIA ) 10 MG tablet Take 1 tablet (10 mg total) by mouth daily with lunch.   famotidine  (PEPCID ) 20 MG tablet Take 1 tablet (20 mg total) by mouth in the morning and at bedtime.   furosemide  (LASIX ) 40 MG tablet Take 0.5 tablets (20 mg total) by mouth daily as needed. Take only for swelling or weight gain (2 lbs in a day or 5 lbs in a week).  Call your PCP for follow up if you are using this frequently.   ibuprofen (ADVIL) 200 MG tablet Take 200 mg by mouth every 6 (six) hours as needed for mild pain (pain score 1-3).   isosorbide  mononitrate (IMDUR ) 60 MG 24 hr tablet Take 1 tablet (60 mg total) by mouth daily at 6 PM.   levothyroxine  (SYNTHROID ) 25 MCG tablet Take 1 tablet (25 mcg total) by mouth before breakfast.   lisinopril  (ZESTRIL ) 2.5 MG tablet Take 1 tablet (2.5 mg total) by mouth daily with lunch.   meclizine  (ANTIVERT ) 25 MG tablet Take 1 tablet (25 mg total) by mouth 3 (three) times daily as needed for dizziness.   nitroGLYCERIN  (NITROSTAT ) 0.4 MG SL tablet Place 1 tablet (0.4 mg total) under the tongue every 5 (five) minutes as needed for chest pain.   nystatin  (MYCOSTATIN /NYSTOP ) powder Apply 1 Application topically to the affected area(s) 2 (two) times daily. (Patient taking differently: Apply 1 Application topically 2 (two) times daily as needed (for dryness).)   ondansetron  (ZOFRAN ) 4 MG tablet Take 1 tablet (4 mg total) by mouth every 8 (eight) hours as needed for nausea or vomiting.   polyethylene glycol powder (GLYCOLAX /MIRALAX ) 17 GM/SCOOP powder Take 17 g by mouth daily.   potassium chloride  SA (KLOR-CON  M) 20 MEQ tablet Take 1 tablet (20 mEq total) by mouth daily at 12 noon.   Probiotic Product (PROBIOTIC DAILY) CAPS Take 1 tablet by mouth daily.   Respiratory Therapy Supplies (NEBULIZER/TUBING/MOUTHPIECE) KIT Use as directed   senna (SENOKOT)  8.6 MG TABS tablet Take 1 tablet (8.6 mg total) by mouth at bedtime. (Patient taking differently: Take 1 tablet by mouth daily as needed for mild constipation.)   sucralfate  (CARAFATE ) 1 g tablet Take 1 tablet (1 g total) by mouth 4 (four) times daily -  with meals and at bedtime. Take one hour prior to morning medications.   valACYclovir  (VALTREX ) 500 MG tablet Take 1 tablet by mouth once every other day     Allergies:   Ibuprofen   Social History   Socioeconomic History   Marital status: Widowed    Spouse name: Not on file   Number of children: 1   Years of education: 47   Highest education level: 12th grade  Occupational History   Occupation:  Retired  Tobacco Use   Smoking status: Former    Current packs/day: 0.00    Types: Cigarettes    Quit date: 04/24/2000    Years since quitting: 23.8    Passive exposure: Past   Smokeless tobacco: Never  Vaping Use   Vaping status: Never Used  Substance and Sexual Activity   Alcohol use: Not Currently    Comment: has previous hx of ETOH abuse, quit 2014   Drug use: No   Sexual activity: Not Currently  Other Topics Concern   Not on file  Social History Narrative   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 [place]   Grew up in [place]   Has daughter locally   Social Drivers of Corporate Investment Banker Strain: Low Risk  (01/19/2024)   Overall Financial Resource Strain (CARDIA)    Difficulty of Paying Living Expenses: Not hard at all  Food Insecurity: No Food Insecurity (01/19/2024)   Hunger Vital Sign    Worried About Running Out of Food in the Last Year: Never true    Ran Out of Food in the Last Year: Never true  Transportation Needs: No Transportation Needs (01/19/2024)   PRAPARE - Administrator, Civil Service (Medical): No    Lack of Transportation (Non-Medical): No  Physical Activity: Inactive (01/19/2024)   Exercise Vital Sign    Days of Exercise per Week: 0 days    Minutes of Exercise per Session: 0 min   Stress: No Stress Concern Present (01/19/2024)   [employer] of Occupational Health - Occupational Stress Questionnaire    Feeling of Stress: Not at all  Social Connections: Moderately Integrated (01/19/2024)   Social Connection and Isolation Panel    Frequency of Communication with Friends and Family: More than three times a week    Frequency of Social Gatherings with Friends and Family: More than three times a week    Attends Religious Services: More than 4 times per year    Active Member of [employer] or Organizations: Yes    Attends Banker Meetings: More than 4 times per year    Marital Status: Widowed     Family History: The patient's family history includes Breast cancer in her mother; Stroke in her father. There is no history of Stomach cancer, Colon cancer, Pancreatic cancer, Esophageal cancer, Colon polyps, or Rectal cancer.  ROS:   Review of Systems  Constitution: Negative for decreased appetite, fever and weight gain.  HENT: Negative for congestion, ear discharge, hoarse voice and sore throat.   Eyes: Negative for discharge, redness, vision loss in right eye and visual halos.  Cardiovascular: Negative for chest pain, dyspnea on exertion, leg swelling, orthopnea and palpitations.  Respiratory: Negative for cough, hemoptysis, shortness of breath and snoring.   Endocrine: Negative for heat intolerance and polyphagia.  Hematologic/Lymphatic: Negative for bleeding problem. Does not bruise/bleed easily.  Skin: Negative for flushing, nail changes, rash and suspicious lesions.  Musculoskeletal: Negative for arthritis, joint pain, muscle cramps, myalgias, neck pain and stiffness.  Gastrointestinal: Negative for abdominal pain, bowel incontinence, diarrhea and excessive appetite.  Genitourinary: Negative for decreased libido, genital sores and incomplete emptying.  Neurological: Negative for brief paralysis, focal weakness, headaches and loss of balance.   Psychiatric/Behavioral: Negative for altered mental status, depression and suicidal ideas.  Allergic/Immunologic: Negative for HIV exposure and persistent infections.    EKGs/Labs/Other Studies Reviewed:    The following studies were reviewed today:   EKG:  The ekg ordered today  demonstrates   Recent Labs: 04/22/2023: TSH 4.34 08/18/2023: Pro Brain Natriuretic Peptide 3,241.0 08/21/2023: B Natriuretic Peptide 20.8 08/22/2023: Magnesium  1.8 12/20/2023: ALT 11; BUN 16; Creatinine, Ser 1.56; Potassium 3.4; Sodium 140 12/28/2023: Hemoglobin 11.8; Platelets 266.0  Recent Lipid Panel    Component Value Date/Time   CHOL 242 (H) 04/22/2023 1232   TRIG 217.0 (H) 04/22/2023 1232   HDL 45.50 04/22/2023 1232   CHOLHDL 5 04/22/2023 1232   VLDL 43.4 (H) 04/22/2023 1232   LDLCALC 153 (H) 04/22/2023 1232   LDLCALC 108 (H) 03/14/2020 1439    Physical Exam:    VS:  BP 104/60 (BP Location: Left Arm, Patient Position: Sitting, Cuff Size: Large)   Pulse 80   Ht 5' 4 (1.626 m)   Wt 200 lb 3.2 oz (90.8 kg)   SpO2 92%   BMI 34.36 kg/m     Wt Readings from Last 3 Encounters:  03/08/24 200 lb 3.2 oz (90.8 kg)  01/19/24 194 lb (88 kg)  12/28/23 194 lb (88 kg)     GEN: Well nourished, well developed in no acute distress HEENT: Normal NECK: No JVD; No carotid bruits LYMPHATICS: No lymphadenopathy CARDIAC: S1S2 noted,RRR, no murmurs, rubs, gallops RESPIRATORY:  Clear to auscultation without rales, wheezing or rhonchi  ABDOMEN: Soft, non-tender, non-distended, +bowel sounds, no guarding. EXTREMITIES: No edema, No cyanosis, no clubbing MUSCULOSKELETAL:  No deformity  SKIN: Warm and dry NEUROLOGIC:  Alert and oriented x 3, non-focal PSYCHIATRIC:  Normal affect, good insight  ASSESSMENT:    No diagnosis found. PLAN:    Mild cad  Hypertension   Hyperlipidemia   Obesity   The patient is in agreement with the above plan. The patient left the office in stable condition.  The patient  will follow up in   Medication Adjustments/Labs and Tests Ordered: Current medicines are reviewed at length with the patient today.  Concerns regarding medicines are outlined above.  No orders of the defined types were placed in this encounter.  No orders of the defined types were placed in this encounter.   Patient Instructions  Medication Instructions:  Your physician recommends that you continue on your current medications as directed. Please refer to the Current Medication list given to you today.  *If you need a refill on your cardiac medications before your next appointment, please call your pharmacy*   Follow-Up: At [facility], you and your health needs are our priority.  As part of our continuing mission to provide you with exceptional heart care, our providers are all part of one team.  This team includes your primary Cardiologist (physician) and Advanced Practice Providers or APPs (Physician Assistants and Nurse Practitioners) who all work together to provide you with the care you need, when you need it.  Your next appointment:   1 year(s)  Provider:   Pruitt Taboada, DO    Other instructions: Please review medications and call the office to verify medications.    Adopting a Healthy Lifestyle.  Know what a healthy weight is for you (roughly BMI <25) and aim to maintain this   Aim for 7+ servings of fruits and vegetables daily   65-80+ fluid ounces of water or unsweet tea for healthy kidneys   Limit to max 1 drink of alcohol per day; avoid smoking/tobacco   Limit animal fats in diet for cholesterol and heart health - choose grass fed whenever available   Avoid highly processed foods, and foods high in saturated/trans fats   Aim for  low stress - take time to unwind and care for your mental health   Aim for 150 min of moderate intensity exercise weekly for heart health, and weights twice weekly for bone health   Aim for 7-9 hours of sleep daily   When it  comes to diets, agreement about the perfect plan isnt easy to find, even among the experts. Experts at the [facility] of [employer] developed an idea known as the Healthy Eating Plate. Just imagine a plate divided into logical, healthy portions.   The emphasis is on diet quality:   Load up on vegetables and fruits - one-half of your plate: Aim for color and variety, and remember that potatoes dont count.   Go for whole grains - one-quarter of your plate: Whole wheat, barley, wheat berries, quinoa, oats, brown rice, and foods made with them. If you want pasta, go with whole wheat pasta.   Protein power - one-quarter of your plate: Fish, chicken, beans, and nuts are all healthy, versatile protein sources. Limit red meat.   The diet, however, does go beyond the plate, offering a few other suggestions.   Use healthy plant oils, such as olive, canola, soy, corn, sunflower and peanut. Check the labels, and avoid partially hydrogenated oil, which have unhealthy trans fats.   If youre thirsty, drink water. Coffee and tea are good in moderation, but skip sugary drinks and limit milk and dairy products to one or two daily servings.   The type of carbohydrate in the diet is more important than the amount. Some sources of carbohydrates, such as vegetables, fruits, whole grains, and beans-are healthier than others.   Finally, stay active  Signed, Dub Huntsman, DO  03/10/2024 10:54 AM    [facility]

## 2024-03-12 ENCOUNTER — Encounter: Payer: Self-pay | Admitting: Family Medicine

## 2024-03-12 ENCOUNTER — Ambulatory Visit: Admitting: Family Medicine

## 2024-03-12 ENCOUNTER — Ambulatory Visit: Payer: Self-pay

## 2024-03-12 VITALS — BP 122/68 | HR 100 | Temp 98.0°F | Resp 16 | Wt 194.4 lb

## 2024-03-12 DIAGNOSIS — G8929 Other chronic pain: Secondary | ICD-10-CM

## 2024-03-12 DIAGNOSIS — R109 Unspecified abdominal pain: Secondary | ICD-10-CM

## 2024-03-12 NOTE — Telephone Encounter (Signed)
 Patient called back Friday 12/5 - left message on Clinical Pharmacist Practitioner's VM that she has received her monthly medication packs. No additional questions from patient.

## 2024-03-12 NOTE — Telephone Encounter (Signed)
 Spoke to patient's daughter Friday and advised her, per pcp, there is not a test for lactose intolerance that we can do and she should stay away from lactose if she thinks this is causing her symptoms. She was also advised to follow up with GI for abdominal pain.

## 2024-03-12 NOTE — Patient Instructions (Signed)
 Please contact the GI team at: [phone]  Stay hydrated.  Try MiraLAX  1 time daily until you see GI.   OK to continue Metamucil.   Pepcid /famotidine  20 mg 2 times daily until you see the GI team.  Let us  know if you need anything.

## 2024-03-12 NOTE — Progress Notes (Signed)
 Chief Complaint  Patient presents with   Abdominal Pain    Abdominal Pain    Veronica Wolf is here for abdominal pain. Here w daughter.   Duration: several years Nighttime awakenings? No Bleeding? No Weight loss? No Palliation: none Provocation: none Associated symptoms: Constipation Denies: fever, nausea, and vomiting Treatment to date: Metamucil Saw cards and GI in past. Has had EGD.   Past Medical History:  Diagnosis Date   Allergic rhinitis    Allergy    seasonal allergies   Anxiety    CKD (chronic kidney disease) 04/04/2017   COPD (chronic obstructive pulmonary disease) (HCC)    uses inhaler   Depression    Fatty liver    GERD (gastroesophageal reflux disease)    Gout    hx of   Hyperlipidemia    on meds   Hypertension    on meds   Myocardial infarction [facility])    Peptic ulcer 01/04/2003   Vertigo     BP 122/68 (BP Location: Left Arm, Patient Position: Sitting)   Pulse 100   Temp 98 F (36.7 C) (Oral)   Resp 16   Wt 194 lb 6.4 oz (88.2 kg)   SpO2 96%   BMI 33.37 kg/m  Gen.: Awake, alert, appears stated age HEENT: Mucous membranes moist without mucosal lesions Heart: Regular rate and rhythm without murmurs Lungs: Clear auscultation bilaterally, no rales or wheezing, normal effort without accessory muscle use. Abdomen: Bowel sounds are present. Abdomen is soft, ttp diffusely, worse in upper quadrants and LLQ region, mildly distended, no masses or organomegaly. Negative Murphy's, Rovsing's, McBurney's, and Carnett's sign. Psych: Normal mood and affect.  Chronic abdominal pain  MiraLAX  daily. Pepcid  20 mg bid.  Cont Metamucil.  Stay hydrated. She saw Dr. Sheena of the cards team. [facility] info provided for f/u there.  F/u as originally scheduled.  Pt and her daughter voiced understanding and agreement to the plan.  [name], DO 03/12/24 11:59 AM

## 2024-03-12 NOTE — Telephone Encounter (Signed)
 FYI Only or Action Required?: Action required by provider: requesting lactose intolerance testing today.  Patient was last seen in primary care on 12/28/2023 by Daryl Setter, NP.  Called Nurse Triage reporting Abdominal Cramping and Allergy Testing.  Symptoms began chronic intermittent.  Interventions attempted: Rest, hydration, or home remedies and Dietary changes.  Symptoms are: unchanged.  Triage Disposition: See PCP Within 2 Weeks  Patient/caregiver understands and will follow disposition?: Yes       Verbal permission from patient to talk with son in Designer, Industrial/product.     Copied from CRM [id number]. Topic: General - Other >> Mar 12, 2024  8:37 AM Rea ORN wrote: Reason for CRM: Pt son in law Kandi called to be advised if lactose intolerance testing would be done at the appt today. I advised that the appt notes state that the pt would like the testing done at today's appt but it is ultimately at the discretion of the provider once she see's him what will take place. Kandi stated that he wants to cancel appt until they can be assured that the testing will be done at the appt. Reason for Disposition  Abdominal pain is a chronic symptom (recurrent or ongoing AND present > 4 weeks)  Answer Assessment - Initial Assessment Questions Son in law Lowanda calling to make sure lactose allergy testing can be performed during this visit otherwise they would like to cancel appointment. This clinical research associate called [facility] to ask if lactose allergy testing can be done in office or if referral is needed, this writer was advised lactose allergy can be preformed in office unless an issue with testing then would be referred out. This clinical research associate relayed information to [place] who states so the testing will be done today this clinical research associate reiterated information from [facility] that clinic does provide lactose allergy testing but may need to be referred out due to other reasons. Caller aware no guarantee is made for lactose  allergy testing today.    1. LOCATION: Where does it hurt?      Abdomen after consuming dairy 2. RADIATION: Does the pain shoot anywhere else? (e.g., chest, back)      3. ONSET: When did the pain begin? (e.g., minutes, hours or days ago)      Intermittently chronic 4. SUDDEN: Gradual or sudden onset?     na 5. PATTERN Does the pain come and go, or is it constant?     intermittent 6. SEVERITY: How bad is the pain?  (e.g., Scale 1-10; mild, moderate, or severe)     varies 7. RECURRENT SYMPTOM: Have you ever had this type of stomach pain before? If Yes, ask: When was the last time? and What happened that time?      Yes-has had multiple work up 8. CAUSE: What do you think is causing the stomach pain? (e.g., gallstones, recent abdominal surgery)     Lactose  9. RELIEVING/AGGRAVATING FACTORS: What makes it better or worse? (e.g., antacids, bending or twisting motion, bowel movement)      10. OTHER SYMPTOMS: Do you have any other symptoms? (e.g., back pain, diarrhea, fever, urination pain, vomiting)       Not related 11. PREGNANCY: Is there any chance you are pregnant? When was your last menstrual period?  Protocols used: Abdominal Pain - Female-A-AH

## 2024-03-15 ENCOUNTER — Other Ambulatory Visit: Payer: Self-pay | Admitting: Pharmacist

## 2024-03-15 ENCOUNTER — Telehealth: Payer: Self-pay | Admitting: Pharmacist

## 2024-03-15 NOTE — Telephone Encounter (Signed)
 Attempt was made to contact patient by phone today for follow up by Clinical Pharmacist regarding medication management.  Unable to reach patient.

## 2024-03-15 NOTE — Telephone Encounter (Signed)
 Tried to reach patient again - no answer. LM on VM with CB# [phone]

## 2024-03-23 ENCOUNTER — Other Ambulatory Visit: Payer: Self-pay

## 2024-03-23 ENCOUNTER — Telehealth: Payer: Self-pay | Admitting: Pharmacist

## 2024-03-23 ENCOUNTER — Other Ambulatory Visit (HOSPITAL_COMMUNITY): Payer: Self-pay

## 2024-03-23 MED ORDER — DICYCLOMINE HCL 10 MG PO CAPS
10.0000 mg | ORAL_CAPSULE | Freq: Two times a day (BID) | ORAL | 5 refills | Status: AC
  Filled 2024-03-23 – 2024-04-12 (×2): qty 60, 30d supply, fill #0
  Filled 2024-05-11: qty 60, 30d supply, fill #1

## 2024-03-23 MED ORDER — ONDANSETRON HCL 4 MG PO TABS
4.0000 mg | ORAL_TABLET | Freq: Three times a day (TID) | ORAL | 1 refills | Status: AC | PRN
  Filled 2024-03-23: qty 30, 10d supply, fill #0

## 2024-03-23 NOTE — Addendum Note (Signed)
 Addended by: DARYL SETTER on: 03/23/2024 04:23 PM   Modules accepted: Orders

## 2024-03-23 NOTE — Telephone Encounter (Signed)
 I sent refills on Dicyclomine  and Zofran .

## 2024-03-23 NOTE — Telephone Encounter (Signed)
 Patient left a message on my VM asking for refill on a medication that was prescribed by Eleanor Ponto for her stomach. Patient did not leave a medication name.   Reviweed her chart and there are several medications she might be referring to to help with her stomach  Sucralfate  - filled #120 = 30 day supply on 03/05/2024 Dicyclomine  - filled #60 on 03/05/2024 Famotidine  - filled #60 = 30 day supply on 03/05/2024 Ondansetron  - filled #30 on 01/04/2024  The only one that she would not have on hand would be ondansetron .   Tried to reach patient to clarify but unable to reach her. LM On VM with CB # [phone].  Will forward to her PCP for advise.

## 2024-03-24 ENCOUNTER — Other Ambulatory Visit (HOSPITAL_COMMUNITY): Payer: Self-pay

## 2024-03-27 ENCOUNTER — Other Ambulatory Visit: Payer: Self-pay

## 2024-03-28 ENCOUNTER — Telehealth: Payer: Self-pay

## 2024-03-28 ENCOUNTER — Telehealth: Payer: Self-pay | Admitting: Family

## 2024-03-28 NOTE — Telephone Encounter (Signed)
 Please advise

## 2024-03-28 NOTE — Telephone Encounter (Signed)
 Copied from CRM #[id number]. Topic: Referral - Request for Referral >> Mar 28, 2024 11:00 AM Mia F wrote: Did the patient discuss referral with their provider in the last year? Yes (If No - schedule appointment) (If Yes - send message)  Appointment offered? Yes  Type of order/referral and detailed reason for visit: Pt daughter called to see who pt was referred to for gastro. She was referred to [facility] [place] in 2024 but not sure if she ever went. She is still having stomach pain and want to see the specialist  Preference of office, provider, location: [facility]   If referral order, have you been seen by this specialty before? Yes referral was created 12/2022 but not sre if she went  (If Yes, this issue or another issue? When? Where?  Can we respond through MyChart? No

## 2024-03-28 NOTE — Telephone Encounter (Signed)
 See additional message that was sent to PCP from triage nurse about same request.

## 2024-03-28 NOTE — Telephone Encounter (Signed)
 Most recent GI visit was with [facility] this past spring. Their number is: [phone].

## 2024-03-28 NOTE — Telephone Encounter (Signed)
 Copied from CRM #[id number]. Topic: Clinical - Red Word Triage >> Mar 28, 2024 11:03 AM Mia F wrote: Red Word that prompted transfer to Nurse Triage: Pt daughter Kate called to get info on a referral that was created a year ago for pt. She says pt is still having stomach pain (did not say she was actively having the pain) but want to get the referral info to schedule an appt with gastro. CRM created because she mentioned the pain but called to get referral info and not to speak with nt or make an appt. Appt was offered but declined.

## 2024-04-02 NOTE — Telephone Encounter (Signed)
 Information given to patient's daughter Kate

## 2024-04-06 ENCOUNTER — Other Ambulatory Visit: Payer: Self-pay | Admitting: Family

## 2024-04-06 MED ORDER — DIAZEPAM 10 MG PO TABS
10.0000 mg | ORAL_TABLET | Freq: Two times a day (BID) | ORAL | 0 refills | Status: DC | PRN
  Filled 2024-04-06 – 2024-04-10 (×2): qty 60, 30d supply, fill #0

## 2024-04-06 NOTE — Telephone Encounter (Unsigned)
 Copied from CRM #[id number]. Topic: Clinical - Medication Refill >> Apr 06, 2024  9:37 AM Shanda MATSU wrote: Medication: diazepam  (VALIUM ) 10 MG tablet  Has the patient contacted their pharmacy? Yes (Agent: If no, request that the patient contact the pharmacy for the refill. If patient does not wish to contact the pharmacy document the reason why and proceed with request.) (Agent: If yes, when and what did the pharmacy advise?)  This is the patient's preferred pharmacy:  [facility] [address] [place] [address] Phone: [phone] Fax: [phone]   Is this the correct pharmacy for this prescription? Yes If no, delete pharmacy and type the correct one.   Has the prescription been filled recently? Yes  Is the patient out of the medication? Yes  Has the patient been seen for an appointment in the last year OR does the patient have an upcoming appointment? Yes  Can we respond through MyChart? No  Agent: Please be advised that Rx refills may take up to 3 business days. We ask that you follow-up with your pharmacy.

## 2024-04-06 NOTE — Telephone Encounter (Signed)
 Requesting: diazepam  10mg   Contract:07/26/22 UDS: 07/26/22 Last Visit: 12/28/23 (urgent visit [facility] - 03/12/2024) Next Visit:01/24/2025 Last Refill: 03/05/24 #60 and 0RF    Please Advise

## 2024-04-07 ENCOUNTER — Other Ambulatory Visit (HOSPITAL_COMMUNITY): Payer: Self-pay

## 2024-04-09 ENCOUNTER — Other Ambulatory Visit: Payer: Self-pay

## 2024-04-10 ENCOUNTER — Other Ambulatory Visit (HOSPITAL_COMMUNITY): Payer: Self-pay

## 2024-04-10 ENCOUNTER — Other Ambulatory Visit: Payer: Self-pay

## 2024-04-11 ENCOUNTER — Other Ambulatory Visit: Payer: Self-pay

## 2024-04-11 ENCOUNTER — Other Ambulatory Visit (HOSPITAL_COMMUNITY): Payer: Self-pay

## 2024-04-11 ENCOUNTER — Other Ambulatory Visit: Payer: Self-pay | Admitting: Family

## 2024-04-11 DIAGNOSIS — R1013 Epigastric pain: Secondary | ICD-10-CM

## 2024-04-11 MED ORDER — SUCRALFATE 1 G PO TABS
1.0000 g | ORAL_TABLET | Freq: Three times a day (TID) | ORAL | 0 refills | Status: DC
  Filled 2024-04-12: qty 120, 30d supply, fill #0

## 2024-04-12 ENCOUNTER — Other Ambulatory Visit: Payer: Self-pay | Admitting: Family

## 2024-04-12 ENCOUNTER — Other Ambulatory Visit: Payer: Self-pay

## 2024-04-12 MED ORDER — FAMOTIDINE 20 MG PO TABS
20.0000 mg | ORAL_TABLET | Freq: Two times a day (BID) | ORAL | 1 refills | Status: AC
  Filled 2024-04-12: qty 60, 30d supply, fill #0
  Filled 2024-05-11: qty 180, 90d supply, fill #1

## 2024-04-12 MED ORDER — FAMOTIDINE 20 MG PO TABS
20.0000 mg | ORAL_TABLET | Freq: Two times a day (BID) | ORAL | 0 refills | Status: DC
  Filled 2024-04-12: qty 60, 30d supply, fill #0

## 2024-04-13 ENCOUNTER — Other Ambulatory Visit: Payer: Self-pay

## 2024-04-17 ENCOUNTER — Encounter: Admitting: Family

## 2024-04-18 ENCOUNTER — Other Ambulatory Visit (HOSPITAL_COMMUNITY): Payer: Self-pay

## 2024-05-01 ENCOUNTER — Other Ambulatory Visit: Payer: Self-pay | Admitting: Family

## 2024-05-02 ENCOUNTER — Other Ambulatory Visit (HOSPITAL_COMMUNITY): Payer: Self-pay

## 2024-05-02 ENCOUNTER — Other Ambulatory Visit: Payer: Self-pay | Admitting: Family

## 2024-05-02 MED ORDER — DIAZEPAM 10 MG PO TABS
10.0000 mg | ORAL_TABLET | Freq: Two times a day (BID) | ORAL | 0 refills | Status: AC | PRN
  Filled 2024-05-02 – 2024-05-08 (×3): qty 60, 30d supply, fill #0

## 2024-05-02 NOTE — Telephone Encounter (Signed)
 Requesting: diazepam  10mg   Contract:07/26/22 UDS:07/26/22 Last Visit: 12/28/23 Next Visit: 05/16/24 Last Refill: 04/06/24 #60 and 0RF   Please Advise

## 2024-05-03 ENCOUNTER — Other Ambulatory Visit (HOSPITAL_COMMUNITY): Payer: Self-pay

## 2024-05-07 ENCOUNTER — Other Ambulatory Visit (HOSPITAL_COMMUNITY): Payer: Self-pay

## 2024-05-08 ENCOUNTER — Other Ambulatory Visit (HOSPITAL_COMMUNITY): Payer: Self-pay

## 2024-05-08 ENCOUNTER — Other Ambulatory Visit: Payer: Self-pay

## 2024-05-11 ENCOUNTER — Other Ambulatory Visit (HOSPITAL_BASED_OUTPATIENT_CLINIC_OR_DEPARTMENT_OTHER): Payer: Self-pay

## 2024-05-11 ENCOUNTER — Other Ambulatory Visit (HOSPITAL_COMMUNITY): Payer: Self-pay

## 2024-05-11 ENCOUNTER — Other Ambulatory Visit: Payer: Self-pay

## 2024-05-11 ENCOUNTER — Other Ambulatory Visit: Payer: Self-pay | Admitting: Family

## 2024-05-11 DIAGNOSIS — R1013 Epigastric pain: Secondary | ICD-10-CM

## 2024-05-11 MED ORDER — SUCRALFATE 1 G PO TABS: 1.0000 g | ORAL_TABLET | Freq: Three times a day (TID) | ORAL | 0 refills | Status: AC

## 2024-05-16 ENCOUNTER — Encounter: Admitting: Family

## 2024-05-30 ENCOUNTER — Ambulatory Visit: Admitting: Cardiology

## 2025-01-24 ENCOUNTER — Ambulatory Visit
